# Patient Record
Sex: Male | Born: 1935 | Race: White | Hispanic: No | Marital: Married | Smoking: Former smoker
Health system: Southern US, Community
[De-identification: ages and names within clinical notes are randomized; demographics above are authoritative.]

## PROBLEM LIST (undated history)

## (undated) DIAGNOSIS — J45909 Unspecified asthma, uncomplicated: Secondary | ICD-10-CM

## (undated) DIAGNOSIS — J189 Pneumonia, unspecified organism: Secondary | ICD-10-CM

## (undated) DIAGNOSIS — D649 Anemia, unspecified: Secondary | ICD-10-CM

## (undated) DIAGNOSIS — I350 Nonrheumatic aortic (valve) stenosis: Secondary | ICD-10-CM

## (undated) DIAGNOSIS — I219 Acute myocardial infarction, unspecified: Secondary | ICD-10-CM

## (undated) DIAGNOSIS — M1990 Unspecified osteoarthritis, unspecified site: Secondary | ICD-10-CM

## (undated) DIAGNOSIS — N400 Enlarged prostate without lower urinary tract symptoms: Secondary | ICD-10-CM

## (undated) DIAGNOSIS — I10 Essential (primary) hypertension: Secondary | ICD-10-CM

## (undated) DIAGNOSIS — Z952 Presence of prosthetic heart valve: Secondary | ICD-10-CM

## (undated) DIAGNOSIS — I739 Peripheral vascular disease, unspecified: Secondary | ICD-10-CM

## (undated) DIAGNOSIS — I2510 Atherosclerotic heart disease of native coronary artery without angina pectoris: Secondary | ICD-10-CM

## (undated) DIAGNOSIS — I209 Angina pectoris, unspecified: Secondary | ICD-10-CM

## (undated) DIAGNOSIS — I82509 Chronic embolism and thrombosis of unspecified deep veins of unspecified lower extremity: Secondary | ICD-10-CM

## (undated) DIAGNOSIS — J449 Chronic obstructive pulmonary disease, unspecified: Secondary | ICD-10-CM

## (undated) DIAGNOSIS — E119 Type 2 diabetes mellitus without complications: Secondary | ICD-10-CM

## (undated) DIAGNOSIS — R161 Splenomegaly, not elsewhere classified: Secondary | ICD-10-CM

## (undated) DIAGNOSIS — R911 Solitary pulmonary nodule: Secondary | ICD-10-CM

## (undated) DIAGNOSIS — R011 Cardiac murmur, unspecified: Secondary | ICD-10-CM

## (undated) DIAGNOSIS — D696 Thrombocytopenia, unspecified: Secondary | ICD-10-CM

## (undated) DIAGNOSIS — I6529 Occlusion and stenosis of unspecified carotid artery: Secondary | ICD-10-CM

## (undated) DIAGNOSIS — C8313 Mantle cell lymphoma, intra-abdominal lymph nodes: Secondary | ICD-10-CM

## (undated) DIAGNOSIS — E785 Hyperlipidemia, unspecified: Secondary | ICD-10-CM

## (undated) DIAGNOSIS — M869 Osteomyelitis, unspecified: Secondary | ICD-10-CM

## (undated) HISTORY — DX: Occlusion and stenosis of unspecified carotid artery: I65.29

## (undated) HISTORY — PX: BACK SURGERY: SHX140

## (undated) HISTORY — DX: Essential (primary) hypertension: I10

## (undated) HISTORY — DX: Type 2 diabetes mellitus without complications: E11.9

## (undated) HISTORY — DX: Unspecified osteoarthritis, unspecified site: M19.90

## (undated) HISTORY — DX: Hyperlipidemia, unspecified: E78.5

## (undated) HISTORY — PX: SHOULDER ARTHROSCOPY W/ ROTATOR CUFF REPAIR: SHX2400

## (undated) HISTORY — PX: INCISIONAL HERNIA REPAIR: SHX193

## (undated) HISTORY — PX: HERNIA REPAIR: SHX51

## (undated) HISTORY — DX: Splenomegaly, not elsewhere classified: R16.1

## (undated) HISTORY — PX: CARPAL TUNNEL RELEASE: SHX101

## (undated) HISTORY — PX: CORONARY ANGIOPLASTY WITH STENT PLACEMENT: SHX49

## (undated) HISTORY — PX: KIDNEY SURGERY: SHX687

## (undated) HISTORY — PX: CATARACT EXTRACTION W/ INTRAOCULAR LENS  IMPLANT, BILATERAL: SHX1307

## (undated) HISTORY — DX: Mantle cell lymphoma, intra-abdominal lymph nodes: C83.13

---

## 1993-12-06 HISTORY — PX: CORONARY ARTERY BYPASS GRAFT: SHX141

## 2001-09-18 ENCOUNTER — Encounter (INDEPENDENT_AMBULATORY_CARE_PROVIDER_SITE_OTHER): Payer: Self-pay | Admitting: Specialist

## 2001-09-18 ENCOUNTER — Other Ambulatory Visit: Admission: RE | Admit: 2001-09-18 | Discharge: 2001-09-18 | Payer: Self-pay | Admitting: Gastroenterology

## 2003-08-30 ENCOUNTER — Ambulatory Visit (HOSPITAL_COMMUNITY): Admission: RE | Admit: 2003-08-30 | Discharge: 2003-08-30 | Payer: Self-pay

## 2004-03-16 ENCOUNTER — Inpatient Hospital Stay (HOSPITAL_COMMUNITY): Admission: EM | Admit: 2004-03-16 | Discharge: 2004-03-19 | Payer: Self-pay | Admitting: Emergency Medicine

## 2004-06-19 ENCOUNTER — Encounter: Admission: RE | Admit: 2004-06-19 | Discharge: 2004-06-19 | Payer: Self-pay | Admitting: Orthopedic Surgery

## 2004-06-23 ENCOUNTER — Ambulatory Visit (HOSPITAL_COMMUNITY): Admission: RE | Admit: 2004-06-23 | Discharge: 2004-06-23 | Payer: Self-pay | Admitting: Orthopedic Surgery

## 2004-06-23 ENCOUNTER — Ambulatory Visit (HOSPITAL_BASED_OUTPATIENT_CLINIC_OR_DEPARTMENT_OTHER): Admission: RE | Admit: 2004-06-23 | Discharge: 2004-06-23 | Payer: Self-pay | Admitting: Orthopedic Surgery

## 2004-09-01 ENCOUNTER — Ambulatory Visit (HOSPITAL_COMMUNITY): Admission: RE | Admit: 2004-09-01 | Discharge: 2004-09-01 | Payer: Self-pay | Admitting: Orthopedic Surgery

## 2004-09-01 ENCOUNTER — Ambulatory Visit (HOSPITAL_BASED_OUTPATIENT_CLINIC_OR_DEPARTMENT_OTHER): Admission: RE | Admit: 2004-09-01 | Discharge: 2004-09-01 | Payer: Self-pay | Admitting: Orthopedic Surgery

## 2004-10-20 ENCOUNTER — Ambulatory Visit (HOSPITAL_BASED_OUTPATIENT_CLINIC_OR_DEPARTMENT_OTHER): Admission: RE | Admit: 2004-10-20 | Discharge: 2004-10-20 | Payer: Self-pay | Admitting: Orthopedic Surgery

## 2004-10-20 ENCOUNTER — Ambulatory Visit (HOSPITAL_COMMUNITY): Admission: RE | Admit: 2004-10-20 | Discharge: 2004-10-20 | Payer: Self-pay | Admitting: Orthopedic Surgery

## 2004-12-09 ENCOUNTER — Ambulatory Visit: Payer: Self-pay | Admitting: Cardiology

## 2004-12-14 ENCOUNTER — Ambulatory Visit: Payer: Self-pay | Admitting: Cardiology

## 2005-03-01 ENCOUNTER — Ambulatory Visit: Payer: Self-pay

## 2005-06-25 ENCOUNTER — Ambulatory Visit: Payer: Self-pay | Admitting: Cardiology

## 2005-12-10 ENCOUNTER — Ambulatory Visit: Payer: Self-pay | Admitting: Cardiology

## 2005-12-15 ENCOUNTER — Ambulatory Visit: Payer: Self-pay | Admitting: Cardiology

## 2006-05-27 ENCOUNTER — Ambulatory Visit: Payer: Self-pay

## 2006-12-28 ENCOUNTER — Ambulatory Visit: Payer: Self-pay | Admitting: Cardiology

## 2007-03-10 ENCOUNTER — Ambulatory Visit: Payer: Self-pay

## 2008-02-22 ENCOUNTER — Ambulatory Visit: Payer: Self-pay | Admitting: Cardiology

## 2008-02-22 LAB — CONVERTED CEMR LAB
ALT: 24 units/L (ref 0–53)
AST: 21 units/L (ref 0–37)
Alkaline Phosphatase: 51 units/L (ref 39–117)
Bilirubin, Direct: 0.1 mg/dL (ref 0.0–0.3)
Cholesterol: 133 mg/dL (ref 0–200)
GFR calc Af Amer: 93 mL/min
HDL: 31.6 mg/dL — ABNORMAL LOW (ref >39.0)
Sodium: 140 meq/L (ref 135–145)
Total Bilirubin: 0.8 mg/dL (ref 0.3–1.2)
Total CHOL/HDL Ratio: 4.2
Total Protein: 6.4 g/dL (ref 6.0–8.3)
VLDL: 43 mg/dL — ABNORMAL HIGH (ref 0–40)

## 2008-02-28 ENCOUNTER — Ambulatory Visit: Payer: Self-pay | Admitting: Cardiology

## 2008-03-08 ENCOUNTER — Ambulatory Visit: Payer: Self-pay

## 2009-01-13 ENCOUNTER — Ambulatory Visit: Payer: Self-pay | Admitting: Cardiology

## 2009-02-12 ENCOUNTER — Ambulatory Visit: Payer: Self-pay

## 2009-11-19 ENCOUNTER — Encounter (INDEPENDENT_AMBULATORY_CARE_PROVIDER_SITE_OTHER): Payer: Self-pay | Admitting: *Deleted

## 2009-12-19 DIAGNOSIS — I10 Essential (primary) hypertension: Secondary | ICD-10-CM

## 2009-12-19 DIAGNOSIS — I679 Cerebrovascular disease, unspecified: Secondary | ICD-10-CM

## 2010-01-09 ENCOUNTER — Ambulatory Visit: Payer: Self-pay | Admitting: Cardiology

## 2010-01-15 ENCOUNTER — Telehealth (INDEPENDENT_AMBULATORY_CARE_PROVIDER_SITE_OTHER): Payer: Self-pay | Admitting: *Deleted

## 2010-01-19 ENCOUNTER — Encounter (HOSPITAL_COMMUNITY): Admission: RE | Admit: 2010-01-19 | Discharge: 2010-03-11 | Payer: Self-pay | Admitting: Internal Medicine

## 2010-01-19 ENCOUNTER — Ambulatory Visit: Payer: Self-pay

## 2010-01-19 ENCOUNTER — Ambulatory Visit: Payer: Self-pay | Admitting: Internal Medicine

## 2010-09-26 ENCOUNTER — Emergency Department (HOSPITAL_COMMUNITY): Admission: EM | Admit: 2010-09-26 | Discharge: 2010-09-26 | Payer: Self-pay | Admitting: Emergency Medicine

## 2011-01-07 NOTE — Progress Notes (Signed)
Summary: Nuclear Pre-Procedure  Phone Note Outgoing Call Call back at [facility] Phone [phone]   Call placed by: Stanton Kidney, EMT-P,  January 15, 2010 2:32 PM Action Taken: Phone Call Completed Summary of Call: Left message with information on Myoview Information Sheet (see scanned document for details).     Nuclear Med Background Indications for Stress Test: Evaluation for Ischemia, Graft Patency   History: Angioplasty, CABG, COPD, Myocardial Perfusion Study  History Comments: '95 CABG x5 4/09 MPS: EF=63%, (-) ischemia     Nuclear Pre-Procedure Cardiac Risk Factors: Carotid Disease, Family History - CAD, History of Smoking, Hypertension, Lipids, NIDDM Height (in): 73

## 2011-01-07 NOTE — Assessment & Plan Note (Signed)
Summary: per check out/sf  Medications Added METOPROLOL SUCCINATE 25 MG XR24H-TAB (METOPROLOL SUCCINATE) 1/2 tab once daily        Visit Type:  1 yr f/u Primary Provider:  Ivery Quale  CC:  no cardiac complaints today.  History of Present Illness: [name] returns today for evaluation management of his coronary artery disease, nonobstructive carotid disease the.  He is having no symptoms of angina or ischemia. His last stress Myoview was in 2009 which was stable. He has good LV function.  He's had no symptoms of TIAs or mini strokes. His last carotid Dopplers were in March of 2010. He had nonobstructive disease.  His wife checks his blood pressure at home. He says it always runs a remarkably good. His blood sugars have also been also running good and is followed by his primary care.  Current Medications (verified): 1)  Aspirin Ec 325 Mg Tbec (Aspirin) .... Take One Tablet By Mouth Daily 2)  Diovan 80 Mg Tabs (Valsartan) .Marland Kitchen.. 1 Tab Once Daily 3)  Metoprolol Succinate 25 Mg Xr24h-Tab (Metoprolol Succinate) .... 1/2 Tab Once Daily 4)  Pravastatin Sodium 40 Mg Tabs (Pravastatin Sodium) .Marland Kitchen.. 1 Tab Once Daily 5)  Avapro 150 Mg Tabs (Irbesartan) .... To Replace Diovan 6)  Metformin Hcl 500 Mg Tabs (Metformin Hcl) .Marland Kitchen.. 1 Tab Two Times A Day 7)  Nitrostat 0.4 Mg Subl (Nitroglycerin) .Marland Kitchen.. 1 Tablet Under Tongue At Onset of Chest Pain; You May Repeat Every 5 Minutes For Up To 3 Doses.  Allergies: 1)  ! Macrodantin 2)  ! * Minocin 3)  ! Zithromax  Past History:  Past Medical History: Last updated: 12/19/2009 CAD, ARTERY BYPASS GRAFT/ 1995 (ICD-414.04) CEREBROVASCULAR DISEASE (ICD-437.9) HYPERTENSION (ICD-401.9) HYPERLIPIDEMIA (ICD-272.4) DIABETES MELLITUS, TYPE II (ICD-250.00)    Past Surgical History: Last updated: 12/19/2009 Rotator Cuff Repair carpal tunnel Repair of incisional hernia. CABG..1995 Kidney surgery Back Surgery  Family History: Last updated:  12/19/2009 Family History of Coronary Artery Disease:  Siblings: Brother use of alcohol  Social History: Last updated: 12/19/2009 Retired from [employer] Married  Tobacco Use - Former.  Alcohol Use - no Drug Use - no  Risk Factors: Smoking Status: quit (12/19/2009)  Review of Systems       negative other than history of present illness  Vital Signs:  Patient profile:   77 year old male Height:      73 inches Weight:      217 pounds BMI:     28.73 Pulse rate:   62 / minute Pulse rhythm:   irregular BP sitting:   158 / 80  (left arm) Cuff size:   large  Vitals Entered By: Danielle Rankin, CMA (January 09, 2010 12:03 PM)  Physical Exam  General:  Well developed, well nourished, in no acute distress. Head:  normocephalic and atraumatic Eyes:  PERRLA/EOM intact; conjunctiva and lids normal. Neck:  Neck supple, no JVD. No masses, thyromegaly or abnormal cervical nodes. Chest Arieal Cuoco:  well-healed median sternotomy Lungs:  Clear bilaterally to auscultation and percussion. Heart:  PMI poorly appreciated, soft systolic murmur with a split S2 no gallop Abdomen:  Bowel sounds positive; abdomen soft and non-tender without masses, organomegaly, or hernias noted. No hepatosplenomegaly. Msk:  decreased ROM.  decreased ROM.   Pulses:  pulses normal in all 4 extremities Extremities:  No clubbing or cyanosis. Neurologic:  Alert and oriented x 3. Skin:  Intact without lesions or rashes. Psych:  Normal affect.   EKG  Procedure date:  01/09/2010  Findings:      normal sinus rhythm with PACs, no acute changes  Impression & Recommendations:  Problem # 1:  CAD, ARTERY BYPASS GRAFT/ 1995 (ICD-414.04)  His updated medication list for this problem includes:    Aspirin Ec 325 Mg Tbec (Aspirin) .Marland Kitchen... Take one tablet by mouth daily    Metoprolol Succinate 25 Mg Xr24h-tab (Metoprolol succinate) .Marland Kitchen... 1/2 tab once daily    Nitrostat 0.4 Mg Subl (Nitroglycerin) .Marland Kitchen... 1 tablet under tongue at  onset of chest pain; you may repeat every 5 minutes for up to 3 doses.  His updated medication list for this problem includes:    Aspirin Ec 325 Mg Tbec (Aspirin) .Marland Kitchen... Take one tablet by mouth daily    Metoprolol Succinate 25 Mg Xr24h-tab (Metoprolol succinate) .Marland Kitchen... 1/2 tab once daily    Nitrostat 0.4 Mg Subl (Nitroglycerin) .Marland Kitchen... 1 tablet under tongue at onset of chest pain; you may repeat every 5 minutes for up to 3 doses.  Orders: EKG w/ Interpretation (93000) Nuclear Stress Test (Nuc Stress Test)  Problem # 2:  CEREBROVASCULAR DISEASE (ICD-437.9) Assessment: Unchanged  Patient Instructions: 1)  Your physician recommends that you schedule a follow-up appointment in: YEAR WITH DR Giulio Bertino 2)  Your physician recommends that you continue on your current medications as directed. Please refer to the Current Medication list given to you today. 3)  Your physician has requested that you have an adenosine myoview.  For further information please visit [email or web address]  Please follow instruction sheet, as given. Prescriptions: METOPROLOL SUCCINATE 25 MG XR24H-TAB (METOPROLOL SUCCINATE) 1/2 tab once daily  #90 x 3   Entered by:   Danielle Rankin, CMA   Authorized by:   Gaylord Shih, MD, [facility]   Signed by:   Danielle Rankin, CMA on 01/09/2010   Method used:   Electronically to        [facility]* (mail-order)       [address] [place], [place]  [address]       Ph: [phone]       Fax: [phone]   RxID:   [id number]

## 2011-01-07 NOTE — Assessment & Plan Note (Signed)
Summary: Cardiology Nuclear Study  Nuclear Med Background Indications for Stress Test: Evaluation for Ischemia, Graft Patency   History: Angioplasty, CABG, COPD, Heart Catheterization, Myocardial Perfusion Study  History Comments: Multiple PTCA's; '95 CABG x 5; 4/09 MPS:no ischemia, EF=63%  Symptoms: DOE, Fatigue    Nuclear Pre-Procedure Cardiac Risk Factors: Carotid Disease, Family History - CAD, History of Smoking, Hypertension, Lipids, NIDDM Caffeine/Decaff Intake: None NPO After: 6:00 PM Lungs: Clear IV 0.9% NS with Angio Cath: 22g     IV Site: (R) Wrist IV Started by: Irean Hong RN Chest Size (in) 46     Height (in): 73 Weight (lb): 212 BMI: 28.07  Nuclear Med Study 1 or 2 day study:  1 day     Stress Test Type:  Eugenie Birks Reading MD:  Arvilla Meres, MD     Referring MD:  Valera Castle, MD Resting Radionuclide:  Technetium 78m Tetrofosmin     Resting Radionuclide Dose:  11.0 mCi  Stress Radionuclide:  Technetium 57m Tetrofosmin     Stress Radionuclide Dose:  33.0 mCi   Stress Protocol   Lexiscan: 0.4 mg   Stress Test Technologist:  Rea College CMA-N     Nuclear Technologist:  Burna Mortimer Deal RT-N  Rest Procedure  Myocardial perfusion imaging was performed at rest 45 minutes following the intravenous administration of Myoview Technetium 34m Tetrofosmin.  Stress Procedure  The patient received IV Lexiscan 0.4 mg over 15-seconds.  Myoview injected at 30-seconds.  There were no significant changes with lexiscan, only occasional PVC's and PAC's.  Quantitative spect images were obtained after a 45 minute delay.  QPS Raw Data Images:  Normal; no motion artifact; normal heart/lung ratio. Stress Images:  There is normal uptake in all areas. Rest Images:  Normal homogeneous uptake in all areas of the myocardium. Subtraction (SDS):  Normal Transient Ischemic Dilatation:  1.13  (Normal <1.22)  Lung/Heart Ratio:  .34  (Normal <0.45)  Quantitative Gated Spect Images QGS  EDV:  129 ml QGS ESV:  47 ml QGS EF:  63 % QGS cine images:  Normal  Findings Normal nuclear study      Overall Impression  Exercise Capacity: Lexiscan study with no exercise. ECG Impression: Baseline: NSR; No significant ST segment change with Lexiscan. Overall Impression: Normal stress nuclear study.  Appended Document: Cardiology Nuclear Study excellent. No change in treatment.  Appended Document: Cardiology Nuclear Study PT AWARE./CY    Appended Document: Cardiology Nuclear Study CORRECTION PT'S WIFE AWARE./CY

## 2011-01-27 ENCOUNTER — Telehealth (INDEPENDENT_AMBULATORY_CARE_PROVIDER_SITE_OTHER): Payer: Self-pay | Admitting: *Deleted

## 2011-02-02 NOTE — Progress Notes (Signed)
   Phone Note Outgoing Call   Call placed by: Lisabeth Devoid RN,  January 27, 2011 4:01 PM Call placed to: Patient Summary of Call: yearly appt  Follow-up for Phone Call        Left message with pt wife about making yearly appt with Dr. Daleen Squibb.  Pt will call back to make appt. Follow-up by: Lisabeth Devoid RN,  January 27, 2011 4:01 PM

## 2011-02-17 LAB — COMPREHENSIVE METABOLIC PANEL
AST: 18 U/L (ref 0–37)
Albumin: 3.9 g/dL (ref 3.5–5.2)
BUN: 18 mg/dL (ref 6–23)
Calcium: 9.3 mg/dL (ref 8.4–10.5)
Chloride: 108 mEq/L (ref 96–112)
Creatinine, Ser: 1.03 mg/dL (ref 0.4–1.5)
GFR calc Af Amer: >60 mL/min (ref >60)
Total Bilirubin: 0.8 mg/dL (ref 0.3–1.2)

## 2011-02-17 LAB — DIFFERENTIAL
Basophils Absolute: 0 10*3/uL (ref 0.0–0.1)
Lymphocytes Relative: 8 % — ABNORMAL LOW (ref 12–46)
Lymphs Abs: 0.7 10*3/uL (ref 0.7–4.0)
Monocytes Absolute: 0.5 10*3/uL (ref 0.1–1.0)
Neutro Abs: 7.7 10*3/uL (ref 1.7–7.7)

## 2011-02-17 LAB — POCT CARDIAC MARKERS
Myoglobin, poc: 98.6 ng/mL (ref 12–200)
Troponin i, poc: <0.05 ng/mL (ref 0.00–0.09)

## 2011-02-17 LAB — CBC
MCH: 33.2 pg (ref 26.0–34.0)
MCHC: 33.6 g/dL (ref 30.0–36.0)
MCV: 98.7 fL (ref 78.0–100.0)
Platelets: 135 10*3/uL — ABNORMAL LOW (ref 150–400)
RBC: 3.86 MIL/uL — ABNORMAL LOW (ref 4.22–5.81)
RDW: 13.3 % (ref 11.5–15.5)

## 2011-02-17 LAB — PROTIME-INR: Prothrombin Time: 14.1 seconds (ref 11.6–15.2)

## 2011-02-17 LAB — APTT: aPTT: 29 seconds (ref 24–37)

## 2011-04-18 ENCOUNTER — Emergency Department (HOSPITAL_COMMUNITY)
Admission: EM | Admit: 2011-04-18 | Discharge: 2011-04-18 | Disposition: A | Discharge status: Home / Self Care | Payer: Medicare Other | Attending: Emergency Medicine | Admitting: Emergency Medicine

## 2011-04-18 ENCOUNTER — Emergency Department (HOSPITAL_COMMUNITY): Payer: Medicare Other

## 2011-04-18 DIAGNOSIS — I2510 Atherosclerotic heart disease of native coronary artery without angina pectoris: Secondary | ICD-10-CM | POA: Insufficient documentation

## 2011-04-18 DIAGNOSIS — Z79899 Other long term (current) drug therapy: Secondary | ICD-10-CM | POA: Insufficient documentation

## 2011-04-18 DIAGNOSIS — R220 Localized swelling, mass and lump, head: Secondary | ICD-10-CM | POA: Insufficient documentation

## 2011-04-18 DIAGNOSIS — R51 Headache: Secondary | ICD-10-CM | POA: Insufficient documentation

## 2011-04-18 DIAGNOSIS — IMO0002 Reserved for concepts with insufficient information to code with codable children: Secondary | ICD-10-CM | POA: Insufficient documentation

## 2011-04-18 DIAGNOSIS — B029 Zoster without complications: Secondary | ICD-10-CM | POA: Insufficient documentation

## 2011-04-18 DIAGNOSIS — R110 Nausea: Secondary | ICD-10-CM | POA: Insufficient documentation

## 2011-04-18 DIAGNOSIS — Z7982 Long term (current) use of aspirin: Secondary | ICD-10-CM | POA: Insufficient documentation

## 2011-04-18 DIAGNOSIS — E119 Type 2 diabetes mellitus without complications: Secondary | ICD-10-CM | POA: Insufficient documentation

## 2011-04-18 LAB — GLUCOSE, CAPILLARY: Glucose-Capillary: 159 mg/dL — ABNORMAL HIGH (ref 70–99)

## 2011-04-20 NOTE — Assessment & Plan Note (Signed)
[facility]                            CARDIOLOGY OFFICE NOTE   [name]                      MRN:          [id number]  DATE:01/13/2009                            DOB:          1927-09-10    [name] comes in today for a followup.   PROBLEM LIST:  1. Coronary artery disease.  He is status post coronary artery bypass      grafting in 1995 with 5 grafts.  His last stress Myoview was March 08, 2008, which showed no ischemia.  Ejection fraction of 63%.  He      is having no angina.  2. Hyperlipidemia, followed by Dr. Eloise [name].  He brings a copy of his      lipids today which looked remarkably good.  3. Hypertension.  Follow up with Dr. Eloise [name].  He has been recently      changed from Diovan to Avapro.  4. Type 2 diabetes.  His last hemoglobin A1c was 7%.  5. Cerebrovascular disease.  Last carotid Doppler showed      nonobstructive plaque in April 2009.  He has antegrade flow in both      vertebrals.   He offers no complaints today.   MEDICATIONS:  1. Aspirin 325 mg a day.  2. Diovan 80 mg a day.  3. Toprol-XL 12.5 mg per day.  4. Pravastatin 40 mg a day.  5. Avapro 150 mg a day, which he has not started yet, but will      substitute for Diovan.  6. Metformin 500 b.i.d.  7. Sublingual nitroglycerin.   PHYSICAL EXAMINATION:  GENERAL:  He is elderly-appearing.  He is in no  acute distress.  SKIN:  Warm and dry.  VITAL SIGNS:  His weight is 215, down 4, blood pressure is 142/62, pulse  58 and regular.  HEENT:  Normal.  NECK:  Carotid upstrokes were equal bilateral without bruits.  No JVD.  Thyroid is not enlarged.  Trachea is midline.  LUNGS:  Clear to auscultation and percussion.  HEART:  A nondisplaced PMI.  Soft S1 and S2.  ABDOMEN:  Soft.  Good bowel sounds.  No midline bruit.  No pulsatile  mass.  EXTREMITIES:  No cyanosis, clubbing, or edema.  Pulses are intact, but  reduced  NEUROLOGIC:  Intact.  SKIN:  Few  ecchymoses and some moles.  No acute abnormalities.   Electrocardiogram shows sinus brady with a slight left axis shift.  No  other changes.  He has an occasional PAC.   ASSESSMENT:  [name] is doing well.  We will schedule carotid  Dopplers.  I have made no change in his medical program.  I will plan on  seeing him back again in a year.     Richard C. Daleen Squibb, MD, [facility]  Electronically Signed    TCW/MedQ  DD: 01/13/2009  DT: 01/14/2009  Job #: [id number]   cc:   [name]. [name], M.D.

## 2011-04-20 NOTE — Assessment & Plan Note (Signed)
[name]                            CARDIOLOGY OFFICE NOTE   Richard, [name]                      MRN:          [id number]  DATE:02/28/2008                            DOB:          1927-07-22    PROBLEM LIST:  [name] comes in today for careful follow up of the  following issues:  1. Coronary artery disease.  Status post coronary bypass grafting in      1995 with 5 grafts.  His last stress Myoview was May 27, 2006, EF      62% with normal wall motion and no ischemia.  He continues to do      well and seems to be fairly asymptomatic.  He does have some      dyspnea on exertion.  2. Mixed hyperlipidemia.  He is being followed by Dr. Dossie [name] at      [facility].  He has had a low HDL for years.  Recent blood      work that he asked [place] to check here showed a total cholesterol 133,      triglycerides of 216, HDL 31.6, VLDL 43, LDL of 66.  His fasting      blood sugar was also 155.  He says Dr. Eloise [name] has been working      with him on this and has added metformin.  He says that I eat      whatever I want to eat.  In fact, he has a good relationship with      my brother [name] in [name], and eats over      there frequently!  3. Hypertension.  4. Hyperglycemia now being treated with metformin.  5. Nonobstructive carotid disease, last carotid Dopplers April 2008.   CURRENT MEDICATIONS:  1. Aspirin 325 mg a day.  2. Diovan 80 mg a day.  3. Toprol XL 12.5 mg a day.  4. Pravastatin 40 mg a day (Lipitor caused significant muscle aches).  5. Metformin 500 mg a day.  6. He carries sublingual nitroglycerin.   PHYSICAL EXAMINATION:  VITAL SIGNS:  His blood pressure is up today.  He  is 162/62, his pulse 57 and regular.  Weight is 218.8 up 1.5 pounds.  HEENT:  He has a ruddy complexion which is baseline.  He seems to have a  little bit of a tremor to his head and his hands.  This seems to be new.  PERRLA.   Extraocular movements are intact.  He has cataracts that are  visible just by looking at him.  Sclerae are clear.  Facial symmetry is  normal.  Dentition is satisfactory.  NECK:  Supple.  Carotid upstrokes were equal bilaterally with soft  carotid bruits.  He has no JVD.  Thyroid is not enlarged.  Trachea is  midline.  LUNGS:  Remarkable for good breath sounds.  HEART:  Reveals a nondisplaced PMI.  Normal S1-S2 without gallop.  ABDOMEN:  Protuberant with good bowel sounds.  No midline bruit.  No  obvious pulsatile mass.  EXTREMITIES:  No cyanosis, clubbing or edema.  He has some varicose  veins.  Pulses were reduced 1+/4+ dorsalis pedis particularly on the  left, 1+ posterior tibials bilaterally.  He has decreased capillary  refill.  NEURO:  Intact.  SKIN:  Shows a few ecchymoses particularly of his upper extremities.   DIAGNOSTICS:  His electrocardiogram shows sinus brady, otherwise normal.   ASSESSMENT/PLAN:  I had a long talk with [name] today.  I have told  him to watch the carbohydrates particularly empty calories such as  sweets and soft drinks.  I have asked him to continue his current  medications.  We will arrange for him to have carotid Dopplers and  adenosine rest/stress Myoview.  If this is stable, we will see him back  in a year.     Richard C. Daleen Squibb, MD, [facility]  Electronically Signed    TCW/MedQ  DD: 02/28/2008  DT: 02/29/2008  Job #: [id number]   cc:   Richard Payor, MD

## 2011-04-23 NOTE — Op Note (Signed)
[name], [name]               ACCOUNT NO.:  [id number]   MEDICAL RECORD NO.:  [id number]          PATIENT TYPE:  AMB   LOCATION:  DSC                          FACILITY:  [facility]   PHYSICIAN:  Katy Fitch. Sypher Montez Hageman., M.D.DATE OF BIRTH:  May 24, 1927   DATE OF PROCEDURE:  10/20/2004  DATE OF DISCHARGE:                                 OPERATIVE REPORT   PREOPERATIVE DIAGNOSES:  1.  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  2.  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   POSTOPERATIVE DIAGNOSES:  1.  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  2.  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   OPERATIONS:  1.  Diagnostic arthroscopy of left glenohumeral joint with identification of      minor deep surface degenerative tearing of supraspinatus and      infraspinatus rotator cuff tendons.  2.  Significant degenerative labral tear and minor chondromalacia of      posterior aspect of glenoid with subsequent debridement of posterior      labrum from 11 o'clock to approximately 8 o'clock posteriorly.  3.  Subacromial decompression with bursectomy, coracoacromial ligament      resection and acromioplasty.  4.  Through a separation incision at the hand, left carpal tunnel release.   OPERATING SURGEON:  Katy Fitch. Sypher, M.D.   ASSISTANT:  Jonni Sanger, P.A.   ANESTHESIA:  General endotracheal supplemented by interscalene block.   SUPERVISING ANESTHESIOLOGIST:  Maren Beach, M.D.   INDICATIONS:  [name] is a  77 year old gentleman referred by Dr. Merlyn Lot  for evaluation and management of a painful left shoulder.  Clinical  examination revealed that he had a history of AC arthropathy and is status  post decompression of the right shoulder and open rotator cuff repair one  year prior.   He was subsequently diagnosed to have bilateral carpal tunnel syndrome and  has had a carpal tunnel release.  He now presents for combined left carpal  tunnel release and appropriate management of his left shoulder pathology.   Preoperative MRI documented profound AC arthropathy, signs of chronic  subacromial impingement with tendonopathy of the rotator cuff and a  calcified coracoacromial ligament.  He had a very prominent distal clavicle  that was the primary cause of this impingement.   Careful inspection of the preoperative MRI also suggested a significant  degree of labral pathology.   We recommended arthroscopic evaluation followed by appropriate intervention.   PROCEDURES:  [name] is brought to the operating room and placed in  the supine position on the operating table.  Dr. Katrinka Blazing placed an  interscalene block in the holding area.   Following the induction of general endotracheal anesthesia, he was carefully  positioned in the beach chair position with the aid of torso and head  holders for shoulder arthroscopy.  The entire left upper extremity and  forequarter were prepped with Duraprep and draped with impervious  arthroscopy drapes.   The initial procedure was the left carpal tunnel release.  The hand was  exposed by release of the stockinette followed by use of an Esmarch bandage  as a forearm tourniquet.  A short incision was fashioned in the line of the  ring finger and the palm.  Subcutaneous tissues were carefully divided,  revealing the palmar fascia.  This was split in line with its fibers to  reveal the common sensory branch of the medial nerve and the superficial  palmar arch.  The  distal margin of the transverse carpal ligament was  isolated and a Runner, broadcasting/film/video was used to clear the synovium off of the  deep surface of the transverse carpal ligament.   The ligament was then released along its ulnar border with scissors,  extending into the distal forearm.  This widely opened the carpal canal.  There was noted to be profound hypertrophy of the ulnar bursa, but otherwise  no masses were noted.   The wound was subsequently repaired with intradermal 3-0 Prolene.   A Tegaderm dressing was applied directly to the wound followed by sterile  gauze.  The stockinette was then reconstituted with an OpSite dressing.   Attention was then directed to the shoulder.  The anatomy of the distal  clavicle, acromion and deltoid was carefully identified with a marking pen  followed by distention of the shoulder with 20 mL of sterile saline  utilizing a 20 gauge spinal needle posteriorly.  The scope was introduced  without difficulty into the glenohumeral joint with a standard posterior  approach followed by diagnostic arthroscopy of the glenohumeral joint.   The high intra-articular surfaces of the glenoid and humeral head were  basically normal, except for a rim of grade 3 chondromalacia adjacent to the  posterior labrum.  The anterior capsule was notable for a moderate degree of  adhesive capsulitis.  A suction shaver was used anteriorly to take down the  adhesions from the adhesive capsulitis followed by use of the bipolar  cautery to obtain hemostasis.   The scope was switched to the anterior portal with switching stick followed  by careful inspection of the posterior pathology.  The labrum was rather  degenerative.  A 4.5 mm suction shaver was used to debride the posterior  labrum to a smooth rim followed by minor synovectomy.  Hemostasis was  achieved with the bipolar cautery.  The condition of the rotator cuff was then documented photographically,  revealing minor  degenerative deep surface tearing at the greater tuberosity.  There was no sign of retracted tear.  The biceps tendon was normal at its  anchor to the superior labrum and normal through the rotator interval.  The  anterior glenohumeral ligaments were normal, the subscapularis was normal  and the inferior capsule was unremarkable.   The scope was removed from the glenohumeral joint and placed in the  subacromial space.  A florid bursitis was noted.  A thorough bursectomy was  accomplished with the suction shaver followed by use of the bipolar cautery  to obtain hemostasis.  The coracoacromial ligament was released with the  cutting cautery and calcification  of the ligament was noted.  A significant  portion of the ligament was resected with the suction shaver.   The acromion was then leveled to a type 1 morphology with a contoured  anterior lip to prevent impingement.  The [facility] joint capsule was taken down  with the cautery and photographic documentation of the [facility] pathology was  obtained.   The distal clavicle ligaments were released with the cautery followed by  conversion to an open distal clavicle resection.  A 2 cm incision was  fashioned directly over this clavicle followed by subperiosteal exposure of  the distal 15 mm of the clavicle.   After baby Bennett retractors were placed, an oscillating saw was used to  remove the distal clavicle, which had a 1 cm long inferior osteophyte that  was causing the primary impingement.   The coracoacromial ligament and other capsule ligamentous structures were  palpated and found to be calcified.  A rongeur was used to remove more of  the coracoacromial ligament.   The wound was then closed with closure of the anterior deltoid and anterior  trapezius muscles with a mattress suture of #2 fiber wire followed by repair  of the skin with subdermal sutures of 2-0 Vicryl and intradermal 3-0  Prolene.  The scope was replaced in the shoulder and clot  and debris removed  from the subacromial space.  Final hemostasis achieved followed by removal  of the arthroscopic equipment.   The portals were repaired with intradermal 3-0 Prolene mattress sutures.   The wounds were then dressed with OpSite dressing followed by a voluminous  gauze dressing and Hypafix.   [name] tolerated the surgery and anesthesia well.  He was transferred  to recovery room with stable vital signs.  For aftercare, he will be  admitted to recovery care for observation of his vital signs and antibiotics  in the form of Ancef 1 g IV q.8h. x 3 doses and appropriate IV and p.o.  analgesics.      Robe   RVS/MEDQ  D:  10/20/2004  T:  10/20/2004  Job:  [id number]   cc:   Cindee Salt, M.D.  [address]  [place]  [place] [address]  Fax: [phone]

## 2011-04-23 NOTE — H&P (Signed)
NAMECRANDALL, HARVEL NO.:  [id number]   MEDICAL RECORD NO.:  [id number]                   PATIENT TYPE:  EMS   LOCATION:  MAJO                                 FACILITY:  [facility]   PHYSICIAN:  Gwen Pounds, M.D.                 DATE OF BIRTH:  1927-02-08   DATE OF ADMISSION:  03/16/2004  DATE OF DISCHARGE:                                HISTORY & PHYSICAL   PRIMARY CARE PHYSICIAN:  Vania Rea. Jarold Motto, M.D.   GASTROENTEROLOGIST:  Vania Rea. Jarold Motto, M.D.   ENT:  Kinnie Scales. Annalee Genta, M.D.   UROLOGIST:  Claudette Laws, M.D.   RHEUMATOLOGIST:  Areatha Keas, M.D.   CARDIOLOGIST:  Jesse Sans. Wall, M.D.   PULMONOLOGIST:  Charlaine Dalton. Sherene Sires, M.D.   SURGEON:  Lebron Conners, M.D.   OPHTHALMOLOGIST:  Lyn Henri, M.D.   CARDIOTHORACIC SURGEON:  Evelene Croon, M.D.   CHIEF COMPLAINT:  Small-bowel obstruction, abdominal pain and bloating.   HISTORY OF PRESENT ILLNESS:  A 77 year old male with coronary artery  disease, hypertension, and hyperlipidemia.  He had a ventral hernia surgery  repair at the end of 2004.  He saw Dr. Jarold Motto January 21, 2004, for a  physical.  Secondary to a hoarse voice, he was sent to Dr. Annalee Genta who  diagnosed him with potential LPR versus GERD and started him on Nexium.  It  did not help much.  This medication was started on March 06, 2004.  He also  had recent prostatitis, question, treated by Dr. Etta Grandchild who placed him on  antibiotics in March of 2005.  He had a reaction to this infection but ended  up getting better.  He was also placed on a new medication, Sanctura on  February 18, 2004.  It helped with his polyuria.  Since starting the Nexium on  March 06, 2004, the patient has been describing increasing GI issues  including abdominal cramping and bloating.  He took one Darvocet last night  because he felt bad and was having increasing shoulder pain.  He had his  last bowel movement at  10 and 11 this morning and had had  little hard  pellets.  He claimed his bowels have changed since starting on the Nexium.  Today after lunch, he developed more epigastric pressure.  It progressed and  worsened.  He called me after hours and I told him to come to the emergency  room.  He described some nausea at home, a significant amount of vomiting in  the emergency room, no fever, positive bloating, positive severe pain,  positive acid bile taste in the mouth and positive significant belching.  In  the ED, he was given an NG tube to low wall suctioning and given some  morphine without help.  He has had no history of any GI surgeries.   PAST MEDICAL HISTORY:  1. Hyperlipidemia.  2. Hypertension.  3. Coronary artery disease, status post CABG in 1995.  4. Osteoarthritis of the right shoulder.  5. Asthmatic bronchitis.  6. Hyperglycemia/diabetes mellitus type 2.  7. BPH.  8. Nephrolithiasis.  9. L-spine laminectomy.  10.      Incisional hernia in the epigastric area.  11.      Leg cramps.   MEDICATIONS:  1. Pravachol 20 daily.  2. Hydrochlorothiazide 12.5 daily.  3. Toprol XL 25 one half daily.  4. Diovan 80 mg daily.  5. Celebrex 200 daily.  6. Ecotrin 325 daily.  7. Nexium 40 daily since March 06, 2004.  8. Sanctura (trospium chloride) 20 mg daily.  9. Darvocet-N 100 p.r.n.  He took his last one last night.  10.      Nitroglycerin 0.4 mg sublingual p.r.n.  11.      Quinine p.r.n.   ALLERGIES:  1. Z-PACK.  2. MACRODANTIN.  3. MINOCIN/DOXYCYCLINE.   SOCIAL HISTORY:  He lives with his wife.  He is retired from [employer].  Remote tobacco.  No alcohol.   FAMILY HISTORY:  Coronary artery disease and longevity.   REVIEW OF SYMPTOMS:  Stress test and evaluation by Dr. Daleen Squibb was negative in  January of 2005.  No fevers, no chills, no night sweats.  He is having  significant for borborygmi, having the GI symptoms as noted above.  No chest  pain.  No shortness of breath.  Had a colonoscopy on September 18, 2004,  which  showed sigmoid polyps.  No other current symptoms at this time.  All other  organ systems reviewed and negative.   PHYSICAL EXAMINATION:  GENERAL APPEARANCE:  Some discomfort secondary to NG  tube.  Was feeling better with 800 mL out at the beginning of this write-up.  VITAL SIGNS:  Afebrile.  Blood pressure 164/88, respiratory rate 16, heart  rate 67.  HEENT:  Dry mouth.  NG tube in place.  EOMI.  PERRL.  PULMONARY:  Clear to auscultation bilaterally.  CARDIOVASCULAR:  Regular midline sternotomy.  ABDOMEN:  Distended, tender, having some bubbling and gurgling in the lower  quadrants.  There is no rebound, no guarding and the NG tube suction noises  were clearly heard.  Epigastric scar where the repaired ventral hernia was  at the epigastric area.  EXTREMITIES:  No clubbing, cyanosis, or edema.   LABORATORY DATA:  EKG showed normal sinus rhythm.   Sodium 137, potassium 4.2, chloride 107, bicarb 24, BUN 27, creatinine 1.3,  glucose 166.  White blood cell count 11.3, hemoglobin 15.4, platelets 235.  CK and troponin I negative.   Acute abdominal series showed dilated small-bowel in stomach, compatible  with small-bowel obstruction.  Chest x-ray shows low volumes, atelectasis  and midline sternotomy.   ASSESSMENT:  This is a 77 year old male being admitted for small-bowel  obstruction, abdominal pain and abdominal discomfort.  He claims not to be  using his Darvocet.  That will be the most likely cause of these symptoms.  Will look into the possibility that Nexium has caused this.   PLAN:  1. Admit.  2. NG tube to suction.  3. Avoid narcotics unless absolutely needed.  4. Toradol for pain.  5. Bowel rest, sips and chips only.  6. Consider surgical evaluation as needed.  7. Reglan for promotility.  8. Avoid Nexium because symptoms correlate with the start of this drug.  It    is probably a coincidence but will look into the possibility and only use  Zantac for now.   Pharmacy to consult and render an opinion.  9. May need enema or suppository for constipation over the next one to two     days.  10.      Repeat acute abdominal series in a.m.  11.      Chloraseptic for sore throat.  12.      Watch blood pressure and use Lopressor IV.  13.      Coronary artery disease is stable.  14.      Rule out pancreatitis and follow up on labs in the morning.  15.      Ativan for anxiety and sleep.  16.      Phenergan p.r.n.                                                Gwen Pounds, M.D.    JMR/MEDQ  D:  03/16/2004  T:  03/17/2004  Job:  [id number]   cc:   Vania Rea. Jarold Motto, M.D. [facility]   Kinnie Scales. Annalee Genta, M.D.  [address] [place]  [place] [address]  Fax: [phone]   Claudette Laws, M.D.  [address]  [place]  [place] [address]  Fax: [phone]   Areatha Keas, M.D.  [address]  [address]  [place]  [place] [address]  Fax: [phone]   Jesse Sans. Wall, M.D.   Charlaine Dalton. Sherene Sires, M.D. [facility]   Lorre Munroe., M.D.  Fax: [phone]   Vernell Leep., M.D.  [address] [place]  [place] [address]  Fax: [phone]   Evelene Croon, M.D.  [address]  [place]  [place] [address]  Fax: [phone]

## 2011-04-23 NOTE — Op Note (Signed)
NAMEGEORG, [name]               ACCOUNT NO.:  [id number]   MEDICAL RECORD NO.:  [id number]          PATIENT TYPE:  AMB   LOCATION:  DSC                          FACILITY:  [facility]   PHYSICIAN:  Katy Fitch. Sypher Montez Hageman., M.D.DATE OF BIRTH:  November 06, 1927   DATE OF PROCEDURE:  09/01/2004  DATE OF DISCHARGE:                                 OPERATIVE REPORT   PREOPERATIVE DIAGNOSIS:  Chronic entrapment neuropathy, right median nerve  at carpal tunnel.   POSTOPERATIVE DIAGNOSIS:  Chronic entrapment neuropathy, right median nerve  at carpal tunnel.   OPERATION:  Release of right transverse carpal ligament.   OPERATING SURGEON:  Katy Fitch. Sypher, M.D.   ASSISTANT:  Jonni Sanger, P.A.   ANESTHESIA:  General by LMA, supervising anesthesiologist is Janetta Hora.  Gelene Mink, M.D.   INDICATIONS:  [name] is a 77 year old right-hand dominant man who is  retired from [employer].  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  He has  developed bilateral hand numbness, right worse than left, consistent with  carpal tunnel syndrome.  Electrodiagnostic studies confirm severe right  carpal tunnel syndrome.   Due to a failure to respond to nonoperative measures, he was brought to the  operating room at this time for release of his right transverse carpal  ligament.   PROCEDURE:  [name] was brought to the operating room and placed in  the supine position on the operating table.  Following induction of general  anesthesia by LMA, the right arm was prepped with Betadine soap and solution  and sterilely draped.  Following exsanguination of the limb with an Esmarch  bandage, the arterial tourniquet over the proximal brachium is inflated to  220 mmHg.  The procedure commenced with a short incision in the line of the  ring finger of the palm.  The subcutaneous tissues were carefully divided to  reveal the palmar fascia.  This was split longitudinally to reveal the  common sensory branch of the  median nerve.   These were followed back to the transverse carpal ligament, which was  carefully isolated from the median enure.  The ligament was released along  its ulnar border, extending into the distal forearm.  This widely opened the  carpal canal.  No masses or other predicaments are noted.   Bleeding points along the margin of the released ligament were  electrocauterized with bipolar current, followed by repair of the skin with  intradermal 3-0 Prolene.   A compressive dressing is applied with a volar plaster splint maintaining  the wrist in 5 degrees of dorsiflexion.   For aftercare, [name] is given a prescription for Percocet 5 mg one  p.o. q.4-6h. p.r.n. pain, 20 tablets without refill.   He will return to our office for follow-up in one week.       RVS/MEDQ  D:  09/01/2004  T:  09/01/2004  Job:  [id number]

## 2011-04-23 NOTE — Assessment & Plan Note (Signed)
[facility]                            CARDIOLOGY OFFICE NOTE   MITCHELL, IWANICKI                      MRN:          [id number]  DATE:12/28/2006                            DOB:          1927/11/07    [name] returns today for further management of the following  issues:  1. Coronary artery disease.  Status post coronary artery bypass      grafting September 1995 x5 grafts.  Stress Myoview May 27, 2006:      EF 62%, normal wall motion and no ischemia.  He is asymptomatic.  2. Mixed hyperlipidemia, being followed by Dr. Eloise Harman.  He has been      at goal except for a low HDL for a number of years.  3. Hypertension.  4. Hyperglycemia.   His medications are:  1. Aspirin 325 a day.  2. Diovan 80 daily.  3. Lipitor 10 daily.  4. Toprol-XL 12.5 daily.  5. Celebrex 200 mg a day.   He looks remarkably good.  His blood pressure is 122/80, pulse 55, and  he is in sinus bradycardia.  EKG is stable.  Weight is 217.  HEENT:  Normocephalic, atraumatic, PERRLA, extraocular movements intact, sclerae  clear.  Facial symmetry is normal, dentition satisfactory.  Carotid  upstrokes were equal bilaterally with no significant bruits.  Thyroid is  not enlarged, trachea is midline.  Lungs are clear to auscultation.  Heart reveals regular rate and rhythm without gallop.  Abdominal exam is  soft with good bowel sounds; there is no hepatomegaly.  Extremities  reveal no cyanosis, clubbing or edema.  Pulses were diminished.  Neurologic exam is intact.   [name] is doing well.  I have made no changes in his program.  We  renewed his Toprol-XL and gave him generic metoprolol extended-release.  I will plan on seeing him back in a year.  At that time he will need a  stress Myoview.     Thomas C. Daleen Squibb, MD, [facility]  Electronically Signed    TCW/MedQ  DD: 12/28/2006  DT: 12/28/2006  Job #: [id number]   cc:   Barry Dienes. Eloise Harman, M.D.

## 2011-04-23 NOTE — Discharge Summary (Signed)
[name], SENN NO.:  [id number]   MEDICAL RECORD NO.:  [id number]                   PATIENT TYPE:  INP   LOCATION:  6707                                 FACILITY:  [facility]   PHYSICIAN:  Barry Dienes. Eloise Harman, M.D.            DATE OF BIRTH:  [birth year]   DATE OF ADMISSION:  03/16/2004  DATE OF DISCHARGE:  03/19/2004                                 DISCHARGE SUMMARY   HISTORY OF PRESENT ILLNESS:  The patient is a 77 year old white male with a  known history of coronary artery disease, hypertension, and hyperlipidemia.  He was recently seen by his ENT physician for evaluation of hoarse throat.  Laryngoscopy was suggestive of GERD-induced laryngitis, so he was started on  Nexium. He was also recently seen by his urologist who felt that he had an  overactive bladder, so he was started on Sanctura. He has noted  progressively worsening constipation since that time. He had had one small  bowel movement on the day prior to admission. Subsequently, he developed  increasing epigastric pain and diffuse abdominal discomfort associated with  nausea and vomiting. He presented to the emergency room for evaluation.  Their evaluation was remarkable for marked distention of the stomach and  small bowel consistent with a small-bowel obstruction. A nasogastric tube  was inserted, and he was admitted to a medical bed for further evaluation.   INITIAL PHYSICAL EXAMINATION:  GENERAL:  In general, he is a well-developed,  well-nourished, white male. His abdominal discomfort was already improved  after nasogastric tube drew off approximately 800 mL of bilious fluid.  VITAL SIGNS:  Temperature was 98, blood pressure 164/88, respirations 16,  pulse 67.  HEENT:  Consistent for dry mouth.  NECK:  Supple and without jugular venous distention or carotid bruit.  CHEST:  Clear to auscultation.  HEART:  Regular rate and rhythm without murmurs, gallops, or rubs.  ABDOMEN:   Moderately distended with diffuse tenderness. There was normal  bowel sounds and no guarding.  EXTREMITIES:  Without clubbing, cyanosis, or edema.   LABORATORY DATA:  EKG showed normal sinus rhythm. Serum sodium 137,  potassium 4.2, chloride 107, CO2 24, BUN 27, creatinine 1.2, glucose 166.  White blood cell count 11.3, hemoglobin 13.4, platelets 235. Cardiac  isoenzymes were normal. Acute abdominal series showed dilated small bowel  and stomach consistent with a small-bowel obstruction. Chest x-ray showed  low lung volumes with atelectasis.   HOSPITAL COURSE:  The patient was admitted to a medical bed without  telemetry. He was treated with several soap suds enemas and diluted  Gastrografin contrast per NG tube. This resulted in several loose bowel  movements and resolution of abdominal discomfort. The nasogastric tube was  clamped, and he was able to eat a regular diet without recurrence of nausea  or vomiting. His abdominal pain resolved entirely.   CONDITION ON DISCHARGE:  On the day of discharge, he  felt at his baseline.  He was able to eat meals yesterday afternoon and this morning without any  difficulty at all. He no longer has any abdominal pain or nausea. He has had  several loose bowel movements that are brown in color.   Most recent vital signs included a blood pressure of 161/74, pulse 54,  respirations 20, temperature 97.9, pulse oximetry was 95% on room air. White  blood cell count 5.5, hemoglobin 12. Serum sodium 137, potassium 4.2,  chloride 17, creatinine 1.0, and fasting blood sugars have been 107, 116,  and 100. A chest x-ray exam had very minimal scattered wheezing, but he was  not complaining of any shortness of breath.   DISCHARGE DIAGNOSES:  1. Small-bowel obstruction.  2. Severe constipation.  3. Hyperlipidemia.  4. Hypertension.  5. Osteoarthritis.  6. Muscle cramps.  7. Chronic obstructive pulmonary disease.  8. Transient hyperglycemia.  9.  Osteoarthritis of the right shoulder.  10.      Lumbar spine osteoarthritis.  11.      History of nephrolithiasis.  12.      Status post 1995 coronary artery bypass graft surgery.   DISCHARGE MEDICATIONS:  1. Pravachol 20 mg p.o. q.d.  2. Diovan 80 mg p.o. q.d.  3. Hydrochlorothiazide 12.5 mg p.o. q.d.  4. Toprol-XL 25 mg 1 half tablet p.o. q.d.  5. Ecotrin 325 mg p.o. q.d.  6. Darvocet-N 100 1 tablet p.o. t.i.d. p.r.n. pain.  7. Senna S 1 to 2 tablets p.o. q.d. p.r.n. constipation.  8. Tums 2 tablets p.o. b.i.d.  9. Nitroglycerin 0.4 mg sublingual p.r.n. chest pain.  10.      Quinine 325 mg p.o. q.h.s. p.r.n. muscle cramps.  11.      Prednisone 20 mg 1 tablet p.o. q.d. for 7 days.   FOLLOWUP PLANS:  He should be seen in one to two weeks by Dr. Eloise Harman at  [facility], and he was given a telephone number to call for  that appointment.                                                Barry Dienes Eloise Harman, M.D.    DGP/MEDQ  D:  03/19/2004  T:  03/19/2004  Job:  [id number]   cc:   Vania Rea. Jarold Motto, M.D. [facility]   Kinnie Scales. Annalee Genta, M.D.  [address] [place]  [place] [address]  Fax: [phone]   Claudette Laws, M.D.  [address]  [place]  [place] [address]  Fax: [phone]   Areatha Keas, M.D.  [address]  [address]  [place]  [place] [address]  Fax: [phone]   Jesse Sans. Wall, M.D.

## 2011-04-23 NOTE — Op Note (Signed)
NAME:  [name], [name]                         ACCOUNT NO.:  [id number]   MEDICAL RECORD NO.:  [id number]                   PATIENT TYPE:  AMB   LOCATION:  DSC                                  FACILITY:  [facility]   PHYSICIAN:  [name]., M.D.          DATE OF BIRTH:  1927-06-30   DATE OF PROCEDURE:  06/23/2004  DATE OF DISCHARGE:                                 OPERATIVE REPORT   PREOPERATIVE DIAGNOSIS:  Chronic stage II impingement, right shoulder, with  MRI evidence of possible full-thickness rotator cuff tear at  musculotendinous junction and MRI evidence of extensive partial bursal side-  tearing and deep-surface tendinopathy of supraspinatus and infraspinatus  tendons.   POSTOPERATIVE DIAGNOSIS:  Deep-surface rotator cuff tear of supraspinatus  and infraspinatus, thoroughly debrided arthroscopically, as well as  extensive tendinopathy of the infraspinatus and supraspinatus tendons deep  to the acromioclavicular joint.   OPERATIONS:  1. Diagnostic arthroscopy, right glenohumeral joint.  2. Arthroscopic debridement of labrum and deep-surface rotator cuff tear.  3. Arthroscopic subacromial decompression with coracoacromial ligament     resection, bursectomy and acromioplasty.  4. Open resection of distal clavicle, that is, Mumford procedure.   OPERATING SURGEON:  Richard Fitch. Sypher, M.D.   ASSISTANT:  [name], M.D.   ANESTHESIA:  General endotracheal supplemented by interscalen block.   SUPERVISING ANESTHESIOLOGIST:  [name], M.D.   INDICATIONS:  [name] is a 77 year old gentleman referred by Dr.  Barry Dienes. [name] for evaluation and management of a painful right  shoulder.   He was initially evaluated by [name] on Apr 20, 2004.   Clinical examination at that time revealed evidence of probable AC arthritis  with secondary impingement.   [name] advised a trial of Relafen and injected the [name] joint with  Celestone, lidocaine and Marcaine.   [name] started a course of therapy under the supervision of [name] ``  and after 1 month, was noted to have mild improvement in his pain but still  had difficulty with abduction.  He had diminished his oral anti-inflammatory  medications and due to persistent pain, [name] elected to proceed with an  MRI evaluation of the right shoulder.   [name] returned on June 15, 2004 for a consultation with [name] and  review of his MRI data.   He was noted to have severe AC arthropathy with an unfavorable morphology  and signs of a possible full-thickness tear at the musculotendinous junction  of the supraspinatus.  There was extensive fluid in the subacromial and  subdeltoid bursa.   [name] referred [name] for an upper extremity orthopedic consult  with myself and clinical examination revealed evidence of persistent stage  II impingement and weakness of abduction consistent with advanced pathology  of the rotator cuff.   The plain films, MRI and clinical exam were reviewed in detail and I advised  [name] to proceed with  arthroscopic evaluation of the shoulder,  anticipating subacromial decompression and distal clavicle resection with  possible repair of the rotator cuff, should we identify a significant tear  at the time of arthroscopy.   Preoperatively, [name] was advised of the potential risks and benefits  of surgery, specific risks being infection, possible neurovascular injury or  failure to detect all the pathology with the arthroscope.  Potential  benefits would be improved range of motion, pain relief and comfort while  sleeping.   After informed consent, he is brought to the operating room at this time.   PROCEDURE:  [name] was brought to the operating room and placed in  supine position upon the operating table.   Following interscalen block in the holding area by [name], anesthesia was  satisfactory in the right forequarter.    Following the induction of general orotracheal anesthesia, he was carefully  positioned in the beach chair position with the aid of the torso and head  holder designed for shoulder arthroscopy.   Procedure commenced with examination of the shoulder under anesthesia.  He  was noted to have near full range of motion with pressure at approximately  160 degrees of elevation due to impingement beneath the coracoacromial  ligament and AC joint.  There was no sign of adhesive capsulitis.   The shoulder and arm were prepped Duraprep and draped with impervious  arthroscopy drapes.   The shoulder was distended with 20 mL of sterile saline by placing a spinal  needle posteriorly.   The scope was then introduced with blunt technique without difficulty.   Diagnostic arthroscopy revealed intact __________ articular cartilage  surfaces on the glenoid and humeral head.   There was a flap tear of the labrum anteriorly at 2 o'clock.  The deep  surface of the supraspinatus and infraspinatus were well-visualized and  found to have about 50% thickness tendinopathy.  The long head of the biceps  had an intact anchor at the glenoid and superior labrum and was normal  through the rotator interval.  The subscapularis was normal in appearance  with minimal synovitis anteriorly.  The inferior recess was normal.   An anterior portal was created under direct vision, followed by use of a 4.5-  mm suction shaver to debride the deep surface of the supraspinatus and  infraspinatus down to a stable margin.  I could not identify a full-  thickness tear arthroscopically from the glenohumeral side.   The scope was then removed from the glenohumeral joint and placed in the  subacromial space.  A florid bursitis was noted.   Extensive bursectomy allowed visualization of the coracoacromial arch and  the coracoacromial ligament.  The anatomy of the anterior acromion was defined and the cutting cautery was  used to  release the coracoacromial ligament.  The suction shaver was used to  remove a 1-cm band of the ligament.   The capsule of the [facility] joint had been violated due to years of impingement.  The distal clavicle was quite prominent.   The cautery was used to obtain hemostasis on the remnants of the inferior  capsule of the [facility] joint, followed by use of a suction bur to level the  acromion to a type 1 morphology with lateral and posterior debridement.  A  small medial osteophyte on the medial margin of the AC joint was removed  with a posterior approach with the bur visualizing laterally.   We removed approximately 50% of the distal clavicle arthroscopically,  however, due to the fact that Mr. Haydon's clavicle was nearly 2 cm in  width and due to the fact that visualization was somewhat challenged due to  unstable blood pressure, we proceeded with completion of the distal clavicle  resection by an open Mumford procedure.   The arthroscopic equipment was removed from the subacromial space after  documentation of satisfactory decompression.  A 2-cm incision was fashioned  over the distal clavicle, followed by subperiosteal dissection of the distal  18 mm of the clavicle.  The capsule was incised with a 15 blade.  The  oscillating saw was used to remove the distal 18 mm of clavicle and a  rongeur was used to smooth the margins of resection.  The acromioplasty was  palpated and the cuff inspected visually and by direct palpation.  No full-  thickness tear at the musculotendinous junction was identified, although  there was considerable tendinopathy due to chronic impingement beneath the  [facility] joint.   The anterior deltoid and trapezius muscles were repaired with a mattress  suture of #2 FiberWire, followed by repair of the subcutaneous tissues with  0 Vicryl and intradermal 3-0 Prolene.  A Steri-Strip was applied to the  incision.   The portals were repaired with intradermal 3-0 Prolene and  Xeroflo.  A  voluminous gauze dressing was applied with Hypafix.  There were no apparent  complications.   Mr. Jeziorski tolerated surgery and anesthesia well.  He was transferred to  the recovery room with stable vital signs.                                               Richard Fitch Naaman [name]., M.D.    RVS/MEDQ  D:  06/23/2004  T:  06/24/2004  Job:  [id number]

## 2011-04-23 NOTE — Op Note (Signed)
   [name], Richard NO.:  [id number]   MEDICAL RECORD NO.:  [id number]                   PATIENT TYPE:  OIB   LOCATION:  NA                                   FACILITY:  [facility]   PHYSICIAN:  Lorre Munroe., M.D.            DATE OF BIRTH:  06/21/1927   DATE OF PROCEDURE:  08/30/2003  DATE OF DISCHARGE:                                 OPERATIVE REPORT   PREOPERATIVE DIAGNOSIS:  Incisional hernia.   POSTOPERATIVE DIAGNOSIS:  Incisional hernia.   OPERATION:  Repair of incisional hernia.   SURGEON:  Lebron Conners, M.D.   ANESTHESIA:  Local with sedation and monitoring by anesthesia.   DESCRIPTION OF PROCEDURE:  After the patient was monitored and anesthetized  and had routine prepping and draping of the upper  abdomen, I liberally  infused 1% Lidocaine with epinephrine into the skin and subcutaneous tissue  and fascia around the area of  the bulge in the area of the upper midline. I  then made a vertical incision directly over the mass and found it to be fat  and separated it from the normal surrounding  subcutaneous tissue, getting  hemostasis with the cautery.   I then delineated the fascia all around it and made a slight cut in the  fascia cephalad to the bulge and reduced  it. It seemed to be entirely  preperitoneal fat rather than containing  any abdominal viscera.   I then freed up the subcutaneous tissues from the fascia for 1 to 2 cm in  all directions.  I took a 1 x 4 inch piece of polypropylene mesh, placed it  underneath the fascia and spread it out and then closed  the hernia defect  with running 0 Prolene suture, incorporating  the mesh into the repair in  the central portion. I felt that the repair was solid.   I got hemostasis again after  irrigating  the subcutaneous tissues and I  closed the subcutaneous tissues with running 3-0 Vicryl and then closed the  skin  with a running intracuticular 4-0 Vicryl and Steri-Strips. The  patient  was stable through the procedure and went to the PACU in good condition.                                                Lorre Munroe., M.D.    Jodi Marble  D:  08/30/2003  T:  08/31/2003  Job:  [id number]   cc:   Thomas C. Wall, M.D.   Barry Dienes Eloise Harman, M.D.  [address]  [place]  [place] [address]  Fax: [phone]

## 2011-12-20 ENCOUNTER — Encounter: Payer: Self-pay | Admitting: *Deleted

## 2011-12-21 ENCOUNTER — Ambulatory Visit (INDEPENDENT_AMBULATORY_CARE_PROVIDER_SITE_OTHER): Payer: Medicare Other | Admitting: Cardiology

## 2011-12-21 ENCOUNTER — Encounter: Payer: Self-pay | Admitting: Cardiology

## 2011-12-21 VITALS — BP 132/68 | HR 63 | Ht 72.0 in | Wt 182.0 lb

## 2011-12-21 DIAGNOSIS — I2510 Atherosclerotic heart disease of native coronary artery without angina pectoris: Secondary | ICD-10-CM

## 2011-12-21 DIAGNOSIS — R0989 Other specified symptoms and signs involving the circulatory and respiratory systems: Secondary | ICD-10-CM

## 2011-12-21 NOTE — Progress Notes (Signed)
HPI Mr. Holderness returns for evaluation and management his coronary disease. His blood work and other medical problems followed by Dr. Jarold Motto.  He's had shingles of the right face and eyes the past year. He has been absolutely miserable. He's lost a substantial amount of weight.  Denies any angina or chest pain. He denies any symptoms of TIAs or mini strokes.  He is compliant with his medications.  Past Medical History  Diagnosis Date  . Coronary atherosclerosis of artery bypass graft   . Cerebrovascular disease, unspecified   . Unspecified essential hypertension   . Other and unspecified hyperlipidemia   . Type II or unspecified type diabetes mellitus without mention of complication, not stated as uncontrolled     Current Outpatient Prescriptions  Medication Sig Dispense Refill  . aspirin 325 MG tablet Take 325 mg by mouth daily.      Marland Kitchen losartan (COZAAR) 100 MG tablet Take 1 tablet by mouth Daily.      . meloxicam (MOBIC) 15 MG tablet Take 1 tablet by mouth Daily.      . metFORMIN (GLUCOPHAGE) 500 MG tablet Take 500 mg by mouth 2 (two) times daily with a meal.      . metoprolol succinate (TOPROL-XL) 25 MG 24 hr tablet Take 25 mg by mouth daily.       . nitroGLYCERIN (NITROSTAT) 0.4 MG SL tablet Place 0.4 mg under the tongue every 5 (five) minutes as needed.      . pravastatin (PRAVACHOL) 40 MG tablet Take 40 mg by mouth daily.      . predniSONE (DELTASONE) 10 MG tablet as directed.        Allergies  Allergen Reactions  . Azithromycin   . Macrodantin   . Minocin   . Minocycline Hcl   . Nitrofurantoin   . Zithromax (Azithromycin Dihydrate)     Family History  Problem Relation Age of Onset  . Coronary artery disease    . Alcohol abuse      History   Social History  . Marital Status: Married    Spouse Name: N/A    Number of Children: N/A  . Years of Education: N/A   Occupational History  . retired [employer]   Social History Main Topics  . Smoking status:  Former Games developer  . Smokeless tobacco: Never Used   Comment: quit in 1986  . Alcohol Use: No  . Drug Use: No  . Sexually Active: Not on file   Other Topics Concern  . Not on file   Social History Narrative  . No narrative on file    ROS ALL NEGATIVE EXCEPT THOSE NOTED IN HPI  PE  General Appearance: well developed, well nourished in no acute distress,thin, elderly HEENT: symmetrical face, PERRLA, good dentition  Neck: no JVD, thyromegaly, or adenopathy, trachea midline Chest: symmetric without deformity Cardiac: PMI non-displaced, RRR, normal S1, S2, no gallop , soft systolic murmur left sternal border Lung: clear to ausculation and percussion Vascular: all pulses full , soft bilateral carotid bruits versus referred sounds Abdominal: nondistended, nontender, good bowel sounds, no HSM, no bruits Extremities: no cyanosis, clubbing or edema, no sign of DVT, no varicosities  Skin: normal color, no rashes Neuro: alert and oriented x 3, non-focal Pysch: normal affect  EKG Normal sinus rhythm with PACs, otherwise normal BMET    Component Value Date/Time   NA 140 09/26/2010 1312   K 3.9 09/26/2010 1312   CL 108 09/26/2010 1312   CO2 24 09/26/2010 1312  GLUCOSE 204* 09/26/2010 1312   BUN 18 09/26/2010 1312   CREATININE 1.03 09/26/2010 1312   CALCIUM 9.3 09/26/2010 1312   GFRNONAA >60 09/26/2010 1312   GFRAA  Value: >60        The eGFR has been calculated using the MDRD equation. This calculation has not been validated in all clinical situations. eGFR's persistently <60 mL/min signify possible Chronic Kidney Disease. 09/26/2010 1312    Lipid Panel     Component Value Date/Time   CHOL 133 02/22/2008 0928   TRIG 216* 02/22/2008 0928   HDL 31.6* 02/22/2008 0928   CHOLHDL 4.2 CALC 02/22/2008 0928   VLDL 43* 02/22/2008 0928    CBC    Component Value Date/Time   WBC 9.0 09/26/2010 1312   RBC 3.86* 09/26/2010 1312   HGB 12.8* 09/26/2010 1312   HCT 38.1* 09/26/2010 1312   PLT  135* 09/26/2010 1312   MCV 98.7 09/26/2010 1312   MCH 33.2 09/26/2010 1312   MCHC 33.6 09/26/2010 1312   RDW 13.3 09/26/2010 1312   LYMPHSABS 0.7 09/26/2010 1312   MONOABS 0.5 09/26/2010 1312   EOSABS 0.1 09/26/2010 1312   BASOSABS 0.0 09/26/2010 1312

## 2011-12-21 NOTE — Assessment & Plan Note (Signed)
Stable.  Continue current medications.

## 2011-12-21 NOTE — Assessment & Plan Note (Signed)
Asymptomatic but new finding. We'll obtain Dopplers. Continue secondary preventive therapy.

## 2011-12-21 NOTE — Patient Instructions (Signed)
Your physician has requested that you have a carotid duplex. This test is an ultrasound of the carotid arteries in your neck. It looks at blood flow through these arteries that supply the brain with blood. Allow one hour for this exam. There are no restrictions or special instructions.  Your physician wants you to follow-up in: 12 months. You will receive a reminder letter in the mail two months in advance. If you don't receive a letter, please call our office to schedule the follow-up appointment.  

## 2011-12-24 NOTE — Progress Notes (Signed)
Addended by: Judithe Modest D on: 12/24/2011 12:09 PM   Modules accepted: Orders

## 2012-01-07 ENCOUNTER — Encounter (INDEPENDENT_AMBULATORY_CARE_PROVIDER_SITE_OTHER): Payer: Medicare Other | Admitting: *Deleted

## 2012-01-07 DIAGNOSIS — R0989 Other specified symptoms and signs involving the circulatory and respiratory systems: Secondary | ICD-10-CM

## 2012-01-07 DIAGNOSIS — I6529 Occlusion and stenosis of unspecified carotid artery: Secondary | ICD-10-CM

## 2012-02-02 ENCOUNTER — Inpatient Hospital Stay (HOSPITAL_COMMUNITY)
Admission: EM | Admit: 2012-02-02 | Discharge: 2012-02-04 | DRG: 249 | Disposition: A | Discharge status: Home / Self Care | Payer: Medicare Other | Attending: Cardiovascular Disease | Admitting: Cardiovascular Disease

## 2012-02-02 ENCOUNTER — Encounter (HOSPITAL_COMMUNITY): Payer: Self-pay

## 2012-02-02 ENCOUNTER — Emergency Department (HOSPITAL_COMMUNITY): Payer: Medicare Other

## 2012-02-02 DIAGNOSIS — B029 Zoster without complications: Secondary | ICD-10-CM | POA: Diagnosis present

## 2012-02-02 DIAGNOSIS — Z7982 Long term (current) use of aspirin: Secondary | ICD-10-CM

## 2012-02-02 DIAGNOSIS — Z79899 Other long term (current) drug therapy: Secondary | ICD-10-CM

## 2012-02-02 DIAGNOSIS — D649 Anemia, unspecified: Secondary | ICD-10-CM | POA: Diagnosis present

## 2012-02-02 DIAGNOSIS — I509 Heart failure, unspecified: Secondary | ICD-10-CM

## 2012-02-02 DIAGNOSIS — I359 Nonrheumatic aortic valve disorder, unspecified: Secondary | ICD-10-CM

## 2012-02-02 DIAGNOSIS — I25810 Atherosclerosis of coronary artery bypass graft(s) without angina pectoris: Secondary | ICD-10-CM | POA: Diagnosis present

## 2012-02-02 DIAGNOSIS — E119 Type 2 diabetes mellitus without complications: Secondary | ICD-10-CM | POA: Diagnosis present

## 2012-02-02 DIAGNOSIS — Z7902 Long term (current) use of antithrombotics/antiplatelets: Secondary | ICD-10-CM

## 2012-02-02 DIAGNOSIS — I10 Essential (primary) hypertension: Secondary | ICD-10-CM | POA: Diagnosis present

## 2012-02-02 DIAGNOSIS — I200 Unstable angina: Secondary | ICD-10-CM

## 2012-02-02 DIAGNOSIS — I679 Cerebrovascular disease, unspecified: Secondary | ICD-10-CM | POA: Diagnosis present

## 2012-02-02 DIAGNOSIS — Z888 Allergy status to other drugs, medicaments and biological substances status: Secondary | ICD-10-CM

## 2012-02-02 DIAGNOSIS — I214 Non-ST elevation (NSTEMI) myocardial infarction: Principal | ICD-10-CM | POA: Diagnosis present

## 2012-02-02 DIAGNOSIS — I658 Occlusion and stenosis of other precerebral arteries: Secondary | ICD-10-CM | POA: Diagnosis present

## 2012-02-02 DIAGNOSIS — I6529 Occlusion and stenosis of unspecified carotid artery: Secondary | ICD-10-CM | POA: Diagnosis present

## 2012-02-02 DIAGNOSIS — Z87891 Personal history of nicotine dependence: Secondary | ICD-10-CM

## 2012-02-02 DIAGNOSIS — I2510 Atherosclerotic heart disease of native coronary artery without angina pectoris: Secondary | ICD-10-CM | POA: Diagnosis present

## 2012-02-02 DIAGNOSIS — E785 Hyperlipidemia, unspecified: Secondary | ICD-10-CM | POA: Diagnosis present

## 2012-02-02 HISTORY — DX: Atherosclerotic heart disease of native coronary artery without angina pectoris: I25.10

## 2012-02-02 HISTORY — DX: Anemia, unspecified: D64.9

## 2012-02-02 HISTORY — DX: Benign prostatic hyperplasia without lower urinary tract symptoms: N40.0

## 2012-02-02 HISTORY — DX: Unspecified asthma, uncomplicated: J45.909

## 2012-02-02 LAB — GLUCOSE, CAPILLARY: Glucose-Capillary: 142 mg/dL — ABNORMAL HIGH (ref 70–99)

## 2012-02-02 LAB — CARDIAC PANEL(CRET KIN+CKTOT+MB+TROPI)
CK, MB: 3.5 ng/mL (ref 0.3–4.0)
CK, MB: 3.8 ng/mL (ref 0.3–4.0)
Relative Index: INVALID (ref 0.0–2.5)
Total CK: 67 U/L (ref 7–232)
Total CK: 64 U/L (ref 7–232)

## 2012-02-02 LAB — URINALYSIS, ROUTINE W REFLEX MICROSCOPIC
Bilirubin Urine: NEGATIVE
Hgb urine dipstick: NEGATIVE
Nitrite: NEGATIVE
Protein, ur: 30 mg/dL — AB
Specific Gravity, Urine: 1.014 (ref 1.005–1.030)
Urobilinogen, UA: 0.2 mg/dL (ref 0.0–1.0)

## 2012-02-02 LAB — COMPREHENSIVE METABOLIC PANEL
Alkaline Phosphatase: 103 U/L (ref 39–117)
BUN: 25 mg/dL — ABNORMAL HIGH (ref 6–23)
CO2: 21 mEq/L (ref 19–32)
Chloride: 108 mEq/L (ref 96–112)
Creatinine, Ser: 1.12 mg/dL (ref 0.50–1.35)
GFR calc non Af Amer: 59 mL/min — ABNORMAL LOW (ref >90)
Glucose, Bld: 181 mg/dL — ABNORMAL HIGH (ref 70–99)
Potassium: 4.3 mEq/L (ref 3.5–5.1)
Total Bilirubin: 0.5 mg/dL (ref 0.3–1.2)

## 2012-02-02 LAB — SAMPLE TO BLOOD BANK

## 2012-02-02 LAB — DIFFERENTIAL
Basophils Relative: 0 % (ref 0–1)
Eosinophils Relative: 2 % (ref 0–5)
Lymphs Abs: 1 10*3/uL (ref 0.7–4.0)
Monocytes Absolute: 0.3 10*3/uL (ref 0.1–1.0)
Neutro Abs: 4.1 10*3/uL (ref 1.7–7.7)

## 2012-02-02 LAB — LIPID PANEL
HDL: 24 mg/dL — ABNORMAL LOW (ref >39)
Total CHOL/HDL Ratio: 4.6 RATIO
Triglycerides: 76 mg/dL (ref <150)

## 2012-02-02 LAB — IRON AND TIBC: UIBC: 238 ug/dL (ref 125–400)

## 2012-02-02 LAB — CBC
HCT: 26.8 % — ABNORMAL LOW (ref 39.0–52.0)
Hemoglobin: 8.5 g/dL — ABNORMAL LOW (ref 13.0–17.0)
MCH: 29.7 pg (ref 26.0–34.0)
MCHC: 31.7 g/dL (ref 30.0–36.0)
MCV: 93.7 fL (ref 78.0–100.0)

## 2012-02-02 LAB — HEPATIC FUNCTION PANEL
ALT: 10 U/L (ref 0–53)
Bilirubin, Direct: 0.1 mg/dL (ref 0.0–0.3)
Indirect Bilirubin: 0.4 mg/dL (ref 0.3–0.9)
Total Protein: 6.1 g/dL (ref 6.0–8.3)

## 2012-02-02 LAB — OCCULT BLOOD, POC DEVICE: Fecal Occult Bld: NEGATIVE

## 2012-02-02 LAB — URINE MICROSCOPIC-ADD ON

## 2012-02-02 LAB — PROTIME-INR: Prothrombin Time: 14.6 seconds (ref 11.6–15.2)

## 2012-02-02 LAB — APTT: aPTT: 29 seconds (ref 24–37)

## 2012-02-02 LAB — TSH: TSH: 1.29 u[IU]/mL (ref 0.350–4.500)

## 2012-02-02 LAB — FERRITIN: Ferritin: 69 ng/mL (ref 22–322)

## 2012-02-02 MED ORDER — ACETAMINOPHEN 325 MG PO TABS
650.0000 mg | ORAL_TABLET | ORAL | Status: DC | PRN
  Administered 2012-02-03: 325 mg via ORAL
  Filled 2012-02-02: qty 1

## 2012-02-02 MED ORDER — SODIUM CHLORIDE 0.9 % IV SOLN: 250.0000 mL | INTRAVENOUS | Status: DC | PRN

## 2012-02-02 MED ORDER — ONDANSETRON HCL 4 MG/2ML IJ SOLN: 4.0000 mg | Freq: Four times a day (QID) | INTRAMUSCULAR | Status: DC | PRN

## 2012-02-02 MED ORDER — HYDROCODONE-ACETAMINOPHEN 5-325 MG PO TABS
1.0000 | ORAL_TABLET | Freq: Three times a day (TID) | ORAL | Status: DC | PRN
  Administered 2012-02-02 – 2012-02-04 (×4): 1 via ORAL
  Filled 2012-02-02 (×4): qty 1

## 2012-02-02 MED ORDER — SIMVASTATIN 20 MG PO TABS
20.0000 mg | ORAL_TABLET | Freq: Every day | ORAL | Status: DC
  Administered 2012-02-02 – 2012-02-03 (×2): 20 mg via ORAL
  Filled 2012-02-02 (×4): qty 1

## 2012-02-02 MED ORDER — ZOLPIDEM TARTRATE 5 MG PO TABS: 5.0000 mg | ORAL_TABLET | Freq: Every evening | ORAL | Status: DC | PRN

## 2012-02-02 MED ORDER — SODIUM CHLORIDE 0.9 % IJ SOLN
3.0000 mL | INTRAMUSCULAR | Status: DC | PRN
  Administered 2012-02-04: 3 mL via INTRAVENOUS

## 2012-02-02 MED ORDER — NITROGLYCERIN 0.4 MG SL SUBL: 0.4000 mg | SUBLINGUAL_TABLET | SUBLINGUAL | Status: DC | PRN

## 2012-02-02 MED ORDER — SODIUM CHLORIDE 0.9 % IJ SOLN
3.0000 mL | Freq: Two times a day (BID) | INTRAMUSCULAR | Status: DC
  Administered 2012-02-03: 3 mL via INTRAVENOUS

## 2012-02-02 MED ORDER — GABAPENTIN 300 MG PO CAPS
300.0000 mg | ORAL_CAPSULE | Freq: Three times a day (TID) | ORAL | Status: DC
  Administered 2012-02-02 – 2012-02-04 (×4): 300 mg via ORAL
  Filled 2012-02-02 (×10): qty 1

## 2012-02-02 MED ORDER — FUROSEMIDE 10 MG/ML IJ SOLN
60.0000 mg | Freq: Once | INTRAMUSCULAR | Status: AC
  Administered 2012-02-02: 60 mg via INTRAVENOUS
  Filled 2012-02-02: qty 6

## 2012-02-02 MED ORDER — ALPRAZOLAM 0.25 MG PO TABS: 0.2500 mg | ORAL_TABLET | Freq: Two times a day (BID) | ORAL | Status: DC | PRN

## 2012-02-02 MED ORDER — METOPROLOL SUCCINATE ER 25 MG PO TB24
25.0000 mg | ORAL_TABLET | Freq: Every day | ORAL | Status: DC
  Administered 2012-02-03 – 2012-02-04 (×2): 25 mg via ORAL
  Filled 2012-02-02 (×3): qty 1

## 2012-02-02 MED ORDER — HEPARIN SOD (PORCINE) IN D5W 100 UNIT/ML IV SOLN
1000.0000 [IU]/h | INTRAVENOUS | Status: DC
  Filled 2012-02-02: qty 250

## 2012-02-02 MED ORDER — NITROGLYCERIN IN D5W 200-5 MCG/ML-% IV SOLN: 5.0000 ug/min | Freq: Once | INTRAVENOUS | Status: DC

## 2012-02-02 MED ORDER — NITROGLYCERIN IN D5W 200-5 MCG/ML-% IV SOLN
5.0000 ug/min | Freq: Once | INTRAVENOUS | Status: AC
  Administered 2012-02-02: 5 ug/min via INTRAVENOUS
  Filled 2012-02-02: qty 250

## 2012-02-02 MED ORDER — INSULIN ASPART 100 UNIT/ML ~~LOC~~ SOLN
0.0000 [IU] | Freq: Three times a day (TID) | SUBCUTANEOUS | Status: DC
  Administered 2012-02-03 – 2012-02-04 (×2): 2 [IU] via SUBCUTANEOUS
  Filled 2012-02-02: qty 3

## 2012-02-02 MED ORDER — HEPARIN BOLUS VIA INFUSION: 4000.0000 [IU] | Freq: Once | INTRAVENOUS | Status: DC

## 2012-02-02 MED ORDER — LOSARTAN POTASSIUM 50 MG PO TABS
100.0000 mg | ORAL_TABLET | Freq: Every day | ORAL | Status: DC
  Administered 2012-02-03 – 2012-02-04 (×2): 100 mg via ORAL
  Filled 2012-02-02 (×3): qty 2

## 2012-02-02 NOTE — ED Notes (Signed)
Pt noted to have a bruise on his LLQ of abdomen. Pt unaware of this and has no known cause.

## 2012-02-02 NOTE — ED Notes (Signed)
Pt presents with 2 week h/o intermittent chest pain that has been relieved with NTG.  Today, pt reports onset of L sided chest pain that began at 0530 today.  Pt took NTG x 1 and ASA at home with 10/10 pain, EMS gave 2 additional NTG with pain subsiding to 2/10.  +shortness of breath, -nausea.  PIV to L FA. EKG shows lateral ischemia.

## 2012-02-02 NOTE — ED Notes (Signed)
Chauncy Passy aware of critical I stat troponin level of 0.23

## 2012-02-02 NOTE — ED Notes (Signed)
Meal ordered

## 2012-02-02 NOTE — ED Provider Notes (Signed)
History     CSN: [id number]  Arrival date & time 02/02/12  4098   First MD Initiated Contact with Patient 02/02/12 [phone]      Chief Complaint  Patient presents with  . Chest Pain    (Consider location/radiation/quality/duration/timing/severity/associated sxs/prior treatment) HPI  Patient relates he had coronary artery bypass graft done in 1995 and has done well. He was last seen by Dr. Daleen Squibb on January 15. He states for the past 2-3 weeks he started having chest pain that is exertional. He states he also is getting shortness of breath with it. He states the amount of exertion that causes the pain is getting less. He states if he sits down the pain will get better. He states however the pain was getting worse and he started having to use nitroglycerin yesterday. He states this morning about 5 AM he woke up with diffuse anterior chest pain but he states is a pressure feeling. His wife reports he was diaphoretic. He denies nausea or vomiting. Patient took one aspirin 325 mg and one nitroglycerin before calling EMS. EMS gave him 2 more nitroglycerin and his pain from a level 10 to a current level II. Patient appears pale however he denies any abdominal pain or melena or blood in his stool. Patient states he has an appointment to see Dr. wall the office tomorrow.  PCP Dr. Eloise Harman Cardiologist Dr. Daleen Squibb  Past Medical History  Diagnosis Date  . Coronary atherosclerosis of artery bypass graft   . Cerebrovascular disease, unspecified   . Unspecified essential hypertension   . Other and unspecified hyperlipidemia   . Type II or unspecified type diabetes mellitus without mention of complication, not stated as uncontrolled   . Osteoarthritis (arthritis due to wear and tear of joints)   . Asthmatic bronchitis   . BPH (benign prostatic hypertrophy)   . Herpes zoster   Shingles right face since May 2012  Past Surgical History  Procedure Date  . Coronary artery bypass graft 1995  . Rotator cuff  repair   . Carpal tunnel release   . Incisional hernia repair   . Kidney surgery   . Back surgery     Family History  Problem Relation Age of Onset  . Coronary artery disease    . Alcohol abuse      History  Substance Use Topics  . Smoking status: Former Games developer  . Smokeless tobacco: Never Used   Comment: quit in 1986  . Alcohol Use: No   Lives with spouse   Review of Systems  All other systems reviewed and are negative.    Allergies  Azithromycin; Macrodantin; Minocin; Minocycline hcl; Nitrofurantoin; and Zithromax  Home Medications   Current Outpatient Rx  Name Route Sig Dispense Refill  . ASPIRIN 325 MG PO TABS Oral Take 325 mg by mouth daily.    Marland Kitchen LOSARTAN POTASSIUM 100 MG PO TABS Oral Take 1 tablet by mouth Daily.    . MELOXICAM 15 MG PO TABS Oral Take 1 tablet by mouth Daily.    Marland Kitchen METFORMIN HCL 500 MG PO TABS Oral Take 500 mg by mouth 2 (two) times daily with a meal.    . METOPROLOL SUCCINATE ER 25 MG PO TB24 Oral Take 25 mg by mouth daily.     Marland Kitchen NITROGLYCERIN 0.4 MG SL SUBL Sublingual Place 0.4 mg under the tongue every 5 (five) minutes as needed.    Marland Kitchen PENICILLIN V POTASSIUM 500 MG PO TABS Oral Take 500 mg by mouth 4 (  four) times daily. For tooth abscess for 6 days beginning 01/24/12    . PRAVASTATIN SODIUM 40 MG PO TABS Oral Take 40 mg by mouth daily.      BP 146/68  Pulse 83  Temp(Src) 97.9 F (36.6 C) (Oral)  Resp 24  Ht 6\' 1"  (1.854 m)  Wt 185 lb (83.915 kg)  BMI 24.41 kg/m2  SpO2 99%  Vital signs normal except for tachypnea   Physical Exam  Nursing note and vitals reviewed. Constitutional: He is oriented to person, place, and time. He appears well-developed and well-nourished.  Non-toxic appearance. He does not appear ill. No distress.  HENT:  Head: Normocephalic and atraumatic.  Right Ear: External ear normal.  Left Ear: External ear normal.  Nose: Nose normal. No mucosal edema or rhinorrhea.  Mouth/Throat: Oropharynx is clear and moist and  mucous membranes are normal. No dental abscesses or uvula swelling.  Eyes: Conjunctivae and EOM are normal. Pupils are equal, round, and reactive to light.  Neck: Normal range of motion and full passive range of motion without pain. Neck supple.  Cardiovascular: Normal rate, regular rhythm and normal heart sounds.  Exam reveals no gallop and no friction rub.   No murmur heard. Pulmonary/Chest: Breath sounds normal. He is in respiratory distress. He has no wheezes. He has no rhonchi. He has no rales. He exhibits no tenderness and no crepitus.       Patient appears to have some tachypnea. He's noted to have some rhonchi and possible rales at the very lower bases of both lungs  Abdominal: Soft. Normal appearance and bowel sounds are normal. He exhibits no distension. There is no tenderness. There is no rebound and no guarding.  Musculoskeletal: Normal range of motion. He exhibits edema. He exhibits no tenderness.       Moves all extremities well. Patient has pitting edema of both lower legs  Neurological: He is alert and oriented to person, place, and time. He has normal strength. No cranial nerve deficit.  Skin: Skin is warm, dry and intact. No rash noted. No erythema. There is pallor.  Psychiatric: He has a normal mood and affect. His speech is normal and behavior is normal. His mood appears not anxious.    ED Course  Procedures (including critical care time)  Pt started on NTG drip and heparin drip. He has already taken ASA PTA. Pt given lasix for apparent CHF by exam.   1610 Jesse Sans here in ED and notified patient has subendocardial ischemia  Heparin held because of the new anemia, waiting for hemoccult to result and to discuss with cardiology. Rhonda informed of these results.   Rectal done, small ext hemorrhoid, no stool in vault, hemoccult sent to lab  Results for orders placed during the hospital encounter of 02/02/12  CBC      Component Value Range   WBC 5.5  4.0 - 10.5  (K/uL)   RBC 2.86 (*) 4.22 - 5.81 (MIL/uL)   Hemoglobin 8.5 (*) 13.0 - 17.0 (g/dL)   HCT 96.0 (*) 45.4 - 52.0 (%)   MCV 93.7  78.0 - 100.0 (fL)   MCH 29.7  26.0 - 34.0 (pg)   MCHC 31.7  30.0 - 36.0 (g/dL)   RDW 09.8 (*) 11.9 - 15.5 (%)   Platelets 88 (*) 150 - 400 (K/uL)  DIFFERENTIAL      Component Value Range   Neutrophils Relative 74  43 - 77 (%)   Lymphocytes Relative 18  12 - 46 (%)  Monocytes Relative 6  3 - 12 (%)   Eosinophils Relative 2  0 - 5 (%)   Basophils Relative 0  0 - 1 (%)   Neutro Abs 4.1  1.7 - 7.7 (K/uL)   Lymphs Abs 1.0  0.7 - 4.0 (K/uL)   Monocytes Absolute 0.3  0.1 - 1.0 (K/uL)   Eosinophils Absolute 0.1  0.0 - 0.7 (K/uL)   Basophils Absolute 0.0  0.0 - 0.1 (K/uL)   RBC Morphology POLYCHROMASIA PRESENT     WBC Morphology ATYPICAL LYMPHOCYTES    COMPREHENSIVE METABOLIC PANEL      Component Value Range   Sodium 141  135 - 145 (mEq/L)   Potassium 4.3  3.5 - 5.1 (mEq/L)   Chloride 108  96 - 112 (mEq/L)   CO2 21  19 - 32 (mEq/L)   Glucose, Bld 181 (*) 70 - 99 (mg/dL)   BUN 25 (*) 6 - 23 (mg/dL)   Creatinine, Ser 1.61  0.50 - 1.35 (mg/dL)   Calcium 9.5  8.4 - 09.6 (mg/dL)   Total Protein 6.1  6.0 - 8.3 (g/dL)   Albumin 3.4 (*) 3.5 - 5.2 (g/dL)   AST 20  0 - 37 (U/L)   ALT 10  0 - 53 (U/L)   Alkaline Phosphatase 103  39 - 117 (U/L)   Total Bilirubin 0.5  0.3 - 1.2 (mg/dL)   GFR calc non Af Amer 59 (*) >90 (mL/min)   GFR calc Af Amer 69 (*) >90 (mL/min)  APTT      Component Value Range   aPTT 29  24 - 37 (seconds)  PROTIME-INR      Component Value Range   Prothrombin Time 14.6  11.6 - 15.2 (seconds)   INR 1.12  0.00 - 1.49   PRO B NATRIURETIC PEPTIDE      Component Value Range   Pro B Natriuretic peptide (BNP) 5711.0 (*) 0 - 450 (pg/mL)  POCT I-STAT TROPONIN I      Component Value Range   Troponin i, poc 0.23 (*) 0.00 - 0.08 (ng/mL)   Comment NOTIFIED PHYSICIAN     Comment 3           SAMPLE TO BLOOD BANK      Component Value Range   Blood  Bank Specimen SAMPLE AVAILABLE FOR TESTING     Sample Expiration 02/03/2012    OCCULT BLOOD, POC DEVICE      Component Value Range   Fecal Occult Bld NEGATIVE    URINALYSIS, ROUTINE W REFLEX MICROSCOPIC      Component Value Range   Color, Urine YELLOW  YELLOW    APPearance CLEAR  CLEAR    Specific Gravity, Urine 1.014  1.005 - 1.030    pH 5.5  5.0 - 8.0    Glucose, UA NEGATIVE  NEGATIVE (mg/dL)   Hgb urine dipstick NEGATIVE  NEGATIVE    Bilirubin Urine NEGATIVE  NEGATIVE    Ketones, ur NEGATIVE  NEGATIVE (mg/dL)   Protein, ur 30 (*) NEGATIVE (mg/dL)   Urobilinogen, UA 0.2  0.0 - 1.0 (mg/dL)   Nitrite NEGATIVE  NEGATIVE    Leukocytes, UA NEGATIVE  NEGATIVE   HEPATIC FUNCTION PANEL      Component Value Range   Total Protein 6.1  6.0 - 8.3 (g/dL)   Albumin 3.4 (*) 3.5 - 5.2 (g/dL)   AST 20  0 - 37 (U/L)   ALT 10  0 - 53 (U/L)   Alkaline Phosphatase 104  39 -  117 (U/L)   Total Bilirubin 0.5  0.3 - 1.2 (mg/dL)   Bilirubin, Direct 0.1  0.0 - 0.3 (mg/dL)   Indirect Bilirubin 0.4  0.3 - 0.9 (mg/dL)  LIPID PANEL      Component Value Range   Cholesterol 111  0 - 200 (mg/dL)   Triglycerides 76  <161 (mg/dL)   HDL 24 (*) >09 (mg/dL)   Total CHOL/HDL Ratio 4.6     VLDL 15  0 - 40 (mg/dL)   LDL Cholesterol 72  0 - 99 (mg/dL)  URINE MICROSCOPIC-ADD ON      Component Value Range   Squamous Epithelial / LPF RARE  RARE    WBC, UA 0-2  <3 (WBC/hpf)   RBC / HPF 0-2  <3 (RBC/hpf)   Laboratory interpretation all normal except anemia from Oct 2011 when Hb was 12.8, +troponin, elevated BNP HEMOCCULT NEG  Dg Chest Portable 1 View  02/02/2012  *RADIOLOGY REPORT*  Clinical Data: Shortness of breath, chest pain  PORTABLE CHEST - 1 VIEW  Comparison: Chest x-ray of 06/19/2004  Findings: Mild basilar atelectasis is present.  Moderate cardiomegaly is stable.  Median sternotomy sutures are noted from prior CABG. No bony abnormality is seen.  IMPRESSION: Stable cardiomegaly.  No active process.  Minimal  basilar atelectasis.  Original Report Authenticated By: Juline Patch, M.D.     Date: 02/02/2012  Rate: 82  Rhythm: normal sinus rhythm and premature atrial contractions (PAC)  QRS Axis: normal  Intervals: normal  ST/T Wave abnormalities: ST depressions inferiorly and ST depressions laterally  Conduction Disutrbances:none  Narrative Interpretation: Q waves septally  Old EKG Reviewed: changes noted from 09/26/2010 ST depressions new     1. Chest pain   2. NSTEMI (non-ST elevated myocardial infarction)   3. Anemia   4. Congestive heart failure (CHF)     Plan admission   Devoria Albe, MD, FACEP   CRITICAL CARE Performed by: Devoria Albe L   Total critical care time: 31 minutes   Critical care time was exclusive of separately billable procedures and treating other patients.  Critical care was necessary to treat or prevent imminent or life-threatening deterioration.  Critical care was time spent personally by me on the following activities: development of treatment plan with patient and/or surrogate as well as nursing, discussions with consultants, evaluation of patient's response to treatment, examination of patient, obtaining history from patient or surrogate, ordering and performing treatments and interventions, ordering and review of laboratory studies, ordering and review of radiographic studies, pulse oximetry and re-evaluation of patient's condition.    MDM          Ward Givens, MD 02/02/12 [id number]

## 2012-02-02 NOTE — Consult Note (Signed)
CARDIOLOGY CONSULT NOTE   Patient ID: [name] MRN: [id number] DOB/AGE: [birth year] 78 y.o.  Admit date: 02/02/2012  Primary Physician   Garlan Fillers, MD, MD Primary Cardiologist   TW Reason for Consultation   Chest pain  [name] is a 78 y.o. male with a history of CAD. About 2 weeks ago, he began having exertional angina that goes all the way across his chest and would reach 9/10. Initially he would only have symptoms with significant exertion. His symptoms would resolve with rest or one sublingual nitroglycerin. However, his symptoms progressed and over the past week he has gotten angina chest with getting out of bed or minimal activity. He has also noticed a significant increase in his dyspnea on exertion. He denies melena or any obvious bleeding.  Today at about 5 AM he was awakened by substernal chest pain like his previous episodes. It improved with nitroglycerin and aspirin but did not immediately resolve. This was the first episode that had started at rest. It was also a 9/10. It was associated with shortness of breath but no nausea or diaphoresis. When his symptoms did not resolve, EMS was called. They gave him 2 sublingual nitroglycerin and baby aspirin x4. Currently in the emergency room, on O2 and IV nitroglycerin, he is pain-free but becomes short of breath with conversation and minimal activity. Dr. Lynelle Doctor did a stool guaiac test which was negative but she felt the sample was minimal.   Diagnosis  . Coronary atherosclerosis of artery bypass graft  . Cerebrovascular disease, unspecified - right ICA < 60%, left ICA < 40%, 01/2012  . Unspecified essential hypertension  . Other and unspecified hyperlipidemia  . Type II or unspecified type diabetes mellitus without mention of complication, not stated as uncontrolled     Past Surgical History  Procedure Date  . Coronary artery bypass graft 1995  . Rotator cuff repair   . Carpal tunnel release   .  Incisional hernia repair   . Kidney surgery    Herpes Zoster 2012  . Back surgery     Allergies  Allergen Reactions  . Azithromycin Itching  . Macrodantin Itching  . Minocin Itching  . Minocycline Hcl Itching  . Nitrofurantoin Itching  . Zithromax (Azithromycin Dihydrate) Itching    I have reviewed the patient's current medications. Medication Sig  . aspirin 325 MG tablet Take 325 mg by mouth daily.  Marland Kitchen losartan (COZAAR) 100 MG tablet Take 1 tablet by mouth Daily.  . meloxicam (MOBIC) 15 MG tablet Take 1 tablet by mouth Daily.  . metFORMIN (GLUCOPHAGE) 500 MG tablet Take 500 mg by mouth 2 (two) times daily with a meal.  . metoprolol succinate (TOPROL-XL) 25 MG 24 hr tablet Take 25 mg by mouth daily.   . nitroGLYCERIN (NITROSTAT) 0.4 MG SL tablet Place 0.4 mg under the tongue every 5 (five) minutes as needed.  . pravastatin (PRAVACHOL) 40 MG tablet Take 40 mg by mouth daily.    History   Social History  . Marital Status: Married    Spouse Name: N/A    Number of Children: N/A  . Years of Education: N/A   Occupational History  . retired [employer]   Social History Main Topics  . Smoking status: Former Games developer  . Smokeless tobacco: Never Used   Comment: quit in 1986  . Alcohol Use: No  . Drug Use: No  . Sexually Active: Not on file   Social History Narrative  .  [place]. He remains active although recently his activity level has been decreased because of his symptoms.      Family History  Problem Relation Age of Onset  . Coronary artery disease    . Alcohol abuse       ROS: He denies reflux symptoms or melena. He has not had any recent illnesses, fevers or chills. He is not aware of any source of blood loss. He has significant dyspnea on exertion. He has occasional musculoskeletal aches and pains. He has a recent left ankle injury but is ambulatory. Full 14 point review of systems complete and found to be negative unless listed   above  Physical Exam: Blood pressure 123/61, pulse 59, temperature 97.9 F (36.6 C), temperature source Oral, resp. rate 21, height 6\' 1"  (1.854 m), weight 185 lb (83.915 kg), SpO2 100.00%.   General: Well developed, well nourished, elderly male who is short of breath Head: Eyes PERRLA, No xanthomas.   Normocephalic and atraumatic, oropharynx without edema or exudate. Dentition poor Lungs: Clear bilaterally to auscultation  Heart: HRRR S1 S2, no rub/gallop, 2/6 murmur. pulses are 2+ & equal all 4 extrem.   Neck: Bilateral carotid bruits. No lymphadenopathy.  JVD not elevated. Abdomen: Bowel sounds present, abdomen soft and non-tender without masses or hernias noted. Msk:  No spine or cva tenderness. He is weak, no joint deformities or effusions. Extremities: No clubbing or cyanosis. Trace edema in his left foot only.  Neuro: Alert and oriented X 3. No focal deficits noted. Psych:  Good affect, responds appropriately Skin: No rashes noted. He has a recent ankle injury on the left and there is some ecchymosis.  Labs:  POC Troponin: 0.23   Lab Results  Component Value Date   WBC 5.5 02/02/2012   HGB 8.5* 02/02/2012   HCT 26.8* 02/02/2012   MCV 93.7 02/02/2012   PLT PENDING 02/02/2012    Basename 02/02/12 0808  INR 1.12    Radiology:  Dg Chest Portable 1 View 02/02/2012  *RADIOLOGY REPORT*  Clinical Data: Shortness of breath, chest pain  PORTABLE CHEST - 1 VIEW  Comparison: Chest x-ray of 06/19/2004  Findings: Mild basilar atelectasis is present.  Moderate cardiomegaly is stable.  Median sternotomy sutures are noted from prior CABG. No bony abnormality is seen.  IMPRESSION: Stable cardiomegaly.  No active process.  Minimal basilar atelectasis.  Original Report Authenticated By: Juline Patch, M.D.   EKG:  Sinus rhythm with diffuse inferolateral ST depression, no ST elevation  ASSESSMENT AND PLAN:   The patient was seen today by Dr. Excell Seltzer, the patient evaluated and the data reviewed.    1. crescendo anginal pain, possible non-ST segment elevation MI: It is unclear if he has an acute vessel closure or if his anginal symptoms are secondary to his anemia. He was initially placed on heparin but this was discontinued because of his anemia. We will continue the IV nitroglycerin and beta blocker. We will decrease his aspirin to 81 mg daily. We will continue to cycle enzymes and will check an echocardiogram. A decision on further evaluation and treatment will be made once more data are available.  2. Anemia: His last previous labs are a year old but his hemoglobin at that time was 12.5. The patient is not aware of any source of blood loss. His initial guaiac was negative but the sample was minimal. We will continue to guaiac stools and an iron profile has been ordered. Consider GI  evaluation. If his subsequent cardiac enzymes are significantly elevated, transfusion may be needed.  3. diabetes: We will hold his metformin and continue sliding scale insulin  4. Volume overload - his BNP is mildly elevated and he has SOB with any exertion. He has had Lasix 60 mg x1 and we will follow volume status, oxygen sats, symptoms and I/O.  The patient is otherwise stable and will be continued on his home medications with the exception of Mobic.    Signed: Theodore Demark 02/02/2012, 8:50 AM Co-Sign MD  Patient seen, examined. Available data reviewed. Agree with findings, assessment, and plan as outlined by Theodore Demark, PA-C. I have independently interviewed and examined the patient and discussed the pertinent medical issues and disposition with the patient and his wife. The patient clearly has symptoms of crescendo angina, now with rest pain this morning. Mildly elevated troponin and lateral ST depression on EKG are all consistent with diagnosis of NSTEMI-ACS. Complicating matters is the fact that he has new anemia and thrombocytopenia. Stool heme test in the ER is negative but there was really no  stool in his rectal vault per Dr Lynelle Doctor. For now will hold on heparin because of his anemia, and will continue IV NTG as he is chest pain-free. Will likely need pRBC transfusion but he should probably have anemia workup first. I contacted Dr Eloise Harman and have asked for an Internal Medicine consult to evaluate the patient's cytopenias. The patient will clearly need cardiac cath and possible PCI pending the cath findings. Further plans as outlined in the note of Ms Barrett.  [name], M.D. 02/02/2012 1:00 PM

## 2012-02-02 NOTE — Progress Notes (Signed)
*  PRELIMINARY RESULTS* Echocardiogram 2D Echocardiogram has been performed.  Katheren Puller 02/02/2012, 11:38 AM

## 2012-02-02 NOTE — ED Notes (Signed)
Spoke with [facility] and they stated that they will by in the next 2 hours

## 2012-02-02 NOTE — ED Notes (Signed)
Notified consulting cardiologist of troponin of 0.39. States that she continues to not want the heparin to be given.

## 2012-02-02 NOTE — ED Notes (Signed)
Family at bedside. 

## 2012-02-02 NOTE — ED Notes (Signed)
Patient is resting comfortably.unable to get temp. 

## 2012-02-02 NOTE — Progress Notes (Signed)
ANTICOAGULATION CONSULT NOTE - Initial Consult  Pharmacy Consult for UFH Indication: NSTEMI  Allergies  Allergen Reactions  . Azithromycin Itching  . Macrodantin Itching  . Minocin Itching  . Minocycline Hcl Itching  . Nitrofurantoin Itching  . Zithromax (Azithromycin Dihydrate) Itching    Patient Measurements: Height: 6\' 1"  (185.4 cm) Weight: 185 lb (83.915 kg) IBW/kg (Calculated) : 79.9   Vital Signs: Temp: 97.9 F (36.6 C) (02/27 0759) Temp src: Oral (02/27 0759) BP: 123/61 mmHg (02/27 0830) Pulse Rate: 59  (02/27 0830)  Labs:  Basename 02/02/12 0808  HGB 8.5*  HCT 26.8*  PLT PENDING  APTT 29  LABPROT 14.6  INR 1.12  HEPARINUNFRC --  CREATININE --  CKTOTAL --  CKMB --  TROPONINI --   Estimated Creatinine Clearance: 62.5 ml/min (by C-G formula based on Cr of 1.03).  Medical History: Past Medical History  Diagnosis Date  . Coronary atherosclerosis of artery bypass graft   . Cerebrovascular disease, unspecified   . Unspecified essential hypertension   . Other and unspecified hyperlipidemia   . Type II or unspecified type diabetes mellitus without mention of complication, not stated as uncontrolled     Medications:   (Not in a hospital admission)  Assessment: 78 y/o male patient admitted with significant cardiac history. C/o shortness of breath and chest pain requiring anticoagulation for NSTEMI. CE found to be positive with EKG some ST depression. Baseline anemia with thrombocytopenia.  Goal of Therapy:  Heparin level 0.3-0.7 units/ml   Plan:  Heparin 4000 unit IV bolus followed by infusion at 1000 units/hr. Check 8 hour heparin level with daily cbc and heparin level.  Verlene Mayer, PharmD, BCPS Pager [phone] 02/02/2012,8:49 AM

## 2012-02-03 ENCOUNTER — Other Ambulatory Visit: Payer: Self-pay

## 2012-02-03 ENCOUNTER — Ambulatory Visit: Payer: Medicare Other | Admitting: Physician Assistant

## 2012-02-03 ENCOUNTER — Encounter (HOSPITAL_COMMUNITY)
Admission: EM | Disposition: A | Discharge status: Home / Self Care | Payer: Self-pay | Source: Home / Self Care | Attending: Cardiovascular Disease

## 2012-02-03 DIAGNOSIS — I214 Non-ST elevation (NSTEMI) myocardial infarction: Secondary | ICD-10-CM

## 2012-02-03 DIAGNOSIS — I25810 Atherosclerosis of coronary artery bypass graft(s) without angina pectoris: Secondary | ICD-10-CM

## 2012-02-03 HISTORY — PX: LEFT AND RIGHT HEART CATHETERIZATION WITH CORONARY/GRAFT ANGIOGRAM: SHX5448

## 2012-02-03 HISTORY — PX: PERCUTANEOUS STENT INTERVENTION: SHX5500

## 2012-02-03 LAB — GLUCOSE, CAPILLARY
Glucose-Capillary: 129 mg/dL — ABNORMAL HIGH (ref 70–99)
Glucose-Capillary: 151 mg/dL — ABNORMAL HIGH (ref 70–99)

## 2012-02-03 LAB — CARDIAC PANEL(CRET KIN+CKTOT+MB+TROPI): Relative Index: INVALID (ref 0.0–2.5)

## 2012-02-03 LAB — CBC
HCT: 26.3 % — ABNORMAL LOW (ref 39.0–52.0)
Hemoglobin: 8.4 g/dL — ABNORMAL LOW (ref 13.0–17.0)
MCH: 30 pg (ref 26.0–34.0)
MCV: 93.9 fL (ref 78.0–100.0)
RBC: 2.8 MIL/uL — ABNORMAL LOW (ref 4.22–5.81)

## 2012-02-03 LAB — DIFFERENTIAL
Eosinophils Absolute: 0.1 10*3/uL (ref 0.0–0.7)
Eosinophils Relative: 2 % (ref 0–5)
Lymphs Abs: 1.2 10*3/uL (ref 0.7–4.0)
Monocytes Absolute: 0.8 10*3/uL (ref 0.1–1.0)
Monocytes Relative: 12 % (ref 3–12)

## 2012-02-03 LAB — IRON AND TIBC
Saturation Ratios: 20 % (ref 20–55)
UIBC: 219 ug/dL (ref 125–400)

## 2012-02-03 LAB — RETICULOCYTES: RBC.: 2.83 MIL/uL — ABNORMAL LOW (ref 4.22–5.81)

## 2012-02-03 LAB — FOLATE: Folate: >20 ng/mL

## 2012-02-03 SURGERY — LEFT AND RIGHT HEART CATHETERIZATION WITH CORONARY/GRAFT ANGIOGRAM
Anesthesia: LOCAL

## 2012-02-03 MED ORDER — MORPHINE SULFATE 2 MG/ML IJ SOLN: 2.0000 mg | Freq: Once | INTRAMUSCULAR | Status: DC

## 2012-02-03 MED ORDER — FENTANYL CITRATE 0.05 MG/ML IJ SOLN
INTRAMUSCULAR | Status: AC
  Filled 2012-02-03: qty 2

## 2012-02-03 MED ORDER — SODIUM CHLORIDE 0.9 % IV SOLN
1.0000 mL/kg/h | INTRAVENOUS | Status: AC
  Administered 2012-02-03: 1 mL/kg/h via INTRAVENOUS

## 2012-02-03 MED ORDER — ASPIRIN 81 MG PO CHEW: 324.0000 mg | CHEWABLE_TABLET | ORAL | Status: DC

## 2012-02-03 MED ORDER — SODIUM CHLORIDE 0.9 % IV SOLN
INTRAVENOUS | Status: DC
  Administered 2012-02-03: 09:00:00 via INTRAVENOUS

## 2012-02-03 MED ORDER — HEART ATTACK BOUNCING BOOK
Freq: Once | Status: DC
  Filled 2012-02-03: qty 1

## 2012-02-03 MED ORDER — NITROGLYCERIN 0.2 MG/ML ON CALL CATH LAB
INTRAVENOUS | Status: AC
  Filled 2012-02-03: qty 1

## 2012-02-03 MED ORDER — ACETAMINOPHEN 325 MG PO TABS: 650.0000 mg | ORAL_TABLET | ORAL | Status: DC | PRN

## 2012-02-03 MED ORDER — HEPARIN (PORCINE) IN NACL 2-0.9 UNIT/ML-% IJ SOLN
INTRAMUSCULAR | Status: AC
  Filled 2012-02-03: qty 2000

## 2012-02-03 MED ORDER — ASPIRIN 81 MG PO CHEW
81.0000 mg | CHEWABLE_TABLET | Freq: Every day | ORAL | Status: DC
  Administered 2012-02-04: 81 mg via ORAL
  Filled 2012-02-03: qty 1

## 2012-02-03 MED ORDER — MORPHINE SULFATE 2 MG/ML IJ SOLN
INTRAMUSCULAR | Status: AC
  Administered 2012-02-03: 02:00:00 2 mg via INTRAVENOUS
  Filled 2012-02-03: qty 1

## 2012-02-03 MED ORDER — ONDANSETRON HCL 4 MG/2ML IJ SOLN: 4.0000 mg | Freq: Four times a day (QID) | INTRAMUSCULAR | Status: DC | PRN

## 2012-02-03 MED ORDER — SODIUM CHLORIDE 0.9 % IJ SOLN: 3.0000 mL | INTRAMUSCULAR | Status: DC | PRN

## 2012-02-03 MED ORDER — ASPIRIN 81 MG PO CHEW
324.0000 mg | CHEWABLE_TABLET | ORAL | Status: AC
  Administered 2012-02-03: 09:00:00 324 mg via ORAL
  Filled 2012-02-03: qty 4

## 2012-02-03 MED ORDER — SODIUM CHLORIDE 0.9 % IJ SOLN: 3.0000 mL | Freq: Two times a day (BID) | INTRAMUSCULAR | Status: DC

## 2012-02-03 MED ORDER — SODIUM CHLORIDE 0.9 % IJ SOLN
3.0000 mL | Freq: Two times a day (BID) | INTRAMUSCULAR | Status: DC
  Administered 2012-02-04: 3 mL via INTRAVENOUS

## 2012-02-03 MED ORDER — HYDROCORTISONE 1 % EX CREA
TOPICAL_CREAM | Freq: Two times a day (BID) | CUTANEOUS | Status: DC | PRN
  Administered 2012-02-03: 1 via TOPICAL
  Filled 2012-02-03: qty 28

## 2012-02-03 MED ORDER — MIDAZOLAM HCL 2 MG/2ML IJ SOLN
INTRAMUSCULAR | Status: AC
  Filled 2012-02-03: qty 2

## 2012-02-03 MED ORDER — CLOPIDOGREL BISULFATE 75 MG PO TABS
75.0000 mg | ORAL_TABLET | Freq: Every day | ORAL | Status: DC
  Administered 2012-02-04: 75 mg via ORAL
  Filled 2012-02-03: qty 1

## 2012-02-03 MED ORDER — LIDOCAINE HCL (PF) 1 % IJ SOLN
INTRAMUSCULAR | Status: AC
  Filled 2012-02-03: qty 30

## 2012-02-03 MED ORDER — POLYSACCHARIDE IRON COMPLEX 150 MG PO CAPS
150.0000 mg | ORAL_CAPSULE | Freq: Every day | ORAL | Status: DC
  Administered 2012-02-04: 150 mg via ORAL
  Filled 2012-02-03: qty 1

## 2012-02-03 MED ORDER — BIVALIRUDIN 250 MG IV SOLR
INTRAVENOUS | Status: AC
  Filled 2012-02-03: qty 250

## 2012-02-03 MED ORDER — SODIUM CHLORIDE 0.9 % IV SOLN: 250.0000 mL | INTRAVENOUS | Status: DC

## 2012-02-03 MED ORDER — DIAZEPAM 2 MG PO TABS
2.0000 mg | ORAL_TABLET | ORAL | Status: AC
  Administered 2012-02-03: 2 mg via ORAL

## 2012-02-03 MED ORDER — SODIUM CHLORIDE 0.9 % IV SOLN: 250.0000 mL | INTRAVENOUS | Status: DC | PRN

## 2012-02-03 NOTE — Consult Note (Signed)
Reason for Consult:  Anemia Referring Physician: Keiron Iodice is an 78 y.o. male.  HPI: the patient is an 78 year old man who is well known to me.  He was admitted by his cardiology group today and had cardiac catheterization and angioplasty done.  Admission laboratory studies were notable for moderate anemia, as well as positive troponin I level.  The patient denies symptoms of nausea, vomiting, abdominal pain, melena, or gross rectal bleeding.  He does use Mobic 15 mg daily as well as the agents to help control his postherpetic neuralgia pain.  He has no history of peptic ulcer disease.  At his annual physical examination in late December 2012 he had labs significant for hemoglobin 9.4 with hematocrit 28, white blood cell count 6.3, platelets 137 with mean corpuscular volume of 94.  Subsequent laboratory tests at that time included a serum protein electrophoresis, serum folate, serum ferritin, serum iron, and TIBC that were within normal limits with a transferrin saturation of 23%.  He also had a stool hemosure test at that time that was negative.  In October 2006 he had a colonoscopy done by Dr. Vilinda Boehringer that showed 2 adenomas polyps.  To my knowledge he has never had an endoscopy done. At his last office visit with [place] last week he was most troubled by his right facial pain from postherpetic neuralgia, and medications were adjusted accordingly to help improve pain control.  Earlier today he described worsening dyspnea on exertion and he had mild dyspnea with conversation despite nasal cannula oxygen.  He has an occasional dry cough, and most recently has had persistent substernal chest pain.  He continues to deny problems with nausea, abdominal pain, or change in bowel movements.  Past Medical History  Diagnosis Date  . Coronary atherosclerosis of artery bypass graft   . Cerebrovascular disease, unspecified   . Unspecified essential hypertension   . Other and unspecified  hyperlipidemia   . Type II or unspecified type diabetes mellitus without mention of complication, not stated as uncontrolled   . Osteoarthritis (arthritis due to wear and tear of joints)   . Asthmatic bronchitis   . BPH (benign prostatic hypertrophy)   . Herpes zoster   . Angina   . Arthritis     Past Surgical History  Procedure Date  . Coronary artery bypass graft 1995  . Rotator cuff repair   . Carpal tunnel release   . Incisional hernia repair   . Kidney surgery   . Back surgery     Family History  Problem Relation Age of Onset  . Coronary artery disease    . Alcohol abuse      Social History:  reports that he has quit smoking. He has never used smokeless tobacco. He reports that he does not drink alcohol or use illicit drugs.  Allergies:  Allergies  Allergen Reactions  . Azithromycin Itching  . Macrodantin Itching  . Minocin Itching  . Minocycline Hcl Itching  . Nitrofurantoin Itching  . Zithromax (Azithromycin Dihydrate) Itching    Medications: I have reviewed the patient's current medications.  Results for orders placed during the hospital encounter of 02/02/12 (from the past 48 hour(s))  CBC     Status: Abnormal   Collection Time   02/02/12  8:08 AM      Component Value Range Comment   WBC 5.5  4.0 - 10.5 (K/uL)    RBC 2.86 (*) 4.22 - 5.81 (MIL/uL)    Hemoglobin 8.5 (*) 13.0 -  17.0 (g/dL)    HCT 62.1 (*) 30.8 - 52.0 (%)    MCV 93.7  78.0 - 100.0 (fL)    MCH 29.7  26.0 - 34.0 (pg)    MCHC 31.7  30.0 - 36.0 (g/dL)    RDW 65.7 (*) 84.6 - 15.5 (%)    Platelets 88 (*) 150 - 400 (K/uL) PLATELET COUNT CONFIRMED BY SMEAR  DIFFERENTIAL     Status: Normal   Collection Time   02/02/12  8:08 AM      Component Value Range Comment   Neutrophils Relative 74  43 - 77 (%)    Lymphocytes Relative 18  12 - 46 (%)    Monocytes Relative 6  3 - 12 (%)    Eosinophils Relative 2  0 - 5 (%)    Basophils Relative 0  0 - 1 (%)    Neutro Abs 4.1  1.7 - 7.7 (K/uL)    Lymphs  Abs 1.0  0.7 - 4.0 (K/uL)    Monocytes Absolute 0.3  0.1 - 1.0 (K/uL)    Eosinophils Absolute 0.1  0.0 - 0.7 (K/uL)    Basophils Absolute 0.0  0.0 - 0.1 (K/uL)    RBC Morphology POLYCHROMASIA PRESENT      WBC Morphology ATYPICAL LYMPHOCYTES     COMPREHENSIVE METABOLIC PANEL     Status: Abnormal   Collection Time   02/02/12  8:08 AM      Component Value Range Comment   Sodium 141  135 - 145 (mEq/L)    Potassium 4.3  3.5 - 5.1 (mEq/L)    Chloride 108  96 - 112 (mEq/L)    CO2 21  19 - 32 (mEq/L)    Glucose, Bld 181 (*) 70 - 99 (mg/dL)    BUN 25 (*) 6 - 23 (mg/dL)    Creatinine, Ser 9.62  0.50 - 1.35 (mg/dL)    Calcium 9.5  8.4 - 10.5 (mg/dL)    Total Protein 6.1  6.0 - 8.3 (g/dL)    Albumin 3.4 (*) 3.5 - 5.2 (g/dL)    AST 20  0 - 37 (U/L)    ALT 10  0 - 53 (U/L)    Alkaline Phosphatase 103  39 - 117 (U/L)    Total Bilirubin 0.5  0.3 - 1.2 (mg/dL)    GFR calc non Af Amer 59 (*) >90 (mL/min)    GFR calc Af Amer 69 (*) >90 (mL/min)   APTT     Status: Normal   Collection Time   02/02/12  8:08 AM      Component Value Range Comment   aPTT 29  24 - 37 (seconds)   PROTIME-INR     Status: Normal   Collection Time   02/02/12  8:08 AM      Component Value Range Comment   Prothrombin Time 14.6  11.6 - 15.2 (seconds)    INR 1.12  0.00 - 1.49    PRO B NATRIURETIC PEPTIDE     Status: Abnormal   Collection Time   02/02/12  8:09 AM      Component Value Range Comment   Pro B Natriuretic peptide (BNP) 5711.0 (*) 0 - 450 (pg/mL)   POCT I-STAT TROPONIN I     Status: Abnormal   Collection Time   02/02/12  8:37 AM      Component Value Range Comment   Troponin i, poc 0.23 (*) 0.00 - 0.08 (ng/mL)    Comment NOTIFIED PHYSICIAN  Comment 3            OCCULT BLOOD, POC DEVICE     Status: Normal   Collection Time   02/02/12  8:54 AM      Component Value Range Comment   Fecal Occult Bld NEGATIVE     URINALYSIS, ROUTINE W REFLEX MICROSCOPIC     Status: Abnormal   Collection Time   02/02/12  8:59 AM       Component Value Range Comment   Color, Urine YELLOW  YELLOW     APPearance CLEAR  CLEAR     Specific Gravity, Urine 1.014  1.005 - 1.030     pH 5.5  5.0 - 8.0     Glucose, UA NEGATIVE  NEGATIVE (mg/dL)    Hgb urine dipstick NEGATIVE  NEGATIVE     Bilirubin Urine NEGATIVE  NEGATIVE     Ketones, ur NEGATIVE  NEGATIVE (mg/dL)    Protein, ur 30 (*) NEGATIVE (mg/dL)    Urobilinogen, UA 0.2  0.0 - 1.0 (mg/dL)    Nitrite NEGATIVE  NEGATIVE     Leukocytes, UA NEGATIVE  NEGATIVE    URINE MICROSCOPIC-ADD ON     Status: Normal   Collection Time   02/02/12  8:59 AM      Component Value Range Comment   Squamous Epithelial / LPF RARE  RARE     WBC, UA 0-2  <3 (WBC/hpf)    RBC / HPF 0-2  <3 (RBC/hpf)   SAMPLE TO BLOOD BANK     Status: Normal   Collection Time   02/02/12  9:00 AM      Component Value Range Comment   Blood Bank Specimen SAMPLE AVAILABLE FOR TESTING      Sample Expiration 02/03/2012     CARDIAC PANEL(CRET KIN+CKTOT+MB+TROPI)     Status: Abnormal   Collection Time   02/02/12  9:09 AM      Component Value Range Comment   Total CK 67  7 - 232 (U/L)    CK, MB 3.5  0.3 - 4.0 (ng/mL)    Troponin I 0.39 (*) <0.30 (ng/mL)    Relative Index RELATIVE INDEX IS INVALID  0.0 - 2.5    TSH     Status: Normal   Collection Time   02/02/12  9:10 AM      Component Value Range Comment   TSH 1.290  0.350 - 4.500 (uIU/mL)   HEPATIC FUNCTION PANEL     Status: Abnormal   Collection Time   02/02/12  9:10 AM      Component Value Range Comment   Total Protein 6.1  6.0 - 8.3 (g/dL)    Albumin 3.4 (*) 3.5 - 5.2 (g/dL)    AST 20  0 - 37 (U/L)    ALT 10  0 - 53 (U/L)    Alkaline Phosphatase 104  39 - 117 (U/L)    Total Bilirubin 0.5  0.3 - 1.2 (mg/dL)    Bilirubin, Direct 0.1  0.0 - 0.3 (mg/dL)    Indirect Bilirubin 0.4  0.3 - 0.9 (mg/dL)   LIPID PANEL     Status: Abnormal   Collection Time   02/02/12  9:10 AM      Component Value Range Comment   Cholesterol 111  0 - 200 (mg/dL)     Triglycerides 76  <150 (mg/dL)    HDL 24 (*) >14 (mg/dL)    Total CHOL/HDL Ratio 4.6      VLDL 15  0 -  40 (mg/dL)    LDL Cholesterol 72  0 - 99 (mg/dL)   IRON AND TIBC     Status: Abnormal   Collection Time   02/02/12  9:10 AM      Component Value Range Comment   Iron 53  42 - 135 (ug/dL)    TIBC 161  096 - 045 (ug/dL)    Saturation Ratios 18 (*) 20 - 55 (%)    UIBC 238  125 - 400 (ug/dL)   FERRITIN     Status: Normal   Collection Time   02/02/12  9:10 AM      Component Value Range Comment   Ferritin 69  22 - 322 (ng/mL)   CARDIAC PANEL(CRET KIN+CKTOT+MB+TROPI)     Status: Abnormal   Collection Time   02/02/12  4:55 PM      Component Value Range Comment   Total CK 64  7 - 232 (U/L)    CK, MB 3.8  0.3 - 4.0 (ng/mL)    Troponin I 0.73 (*) <0.30 (ng/mL)    Relative Index RELATIVE INDEX IS INVALID  0.0 - 2.5    GLUCOSE, CAPILLARY     Status: Abnormal   Collection Time   02/02/12  5:26 PM      Component Value Range Comment   Glucose-Capillary 142 (*) 70 - 99 (mg/dL)    Comment 1 Documented in Chart      Comment 2 Notify RN     GLUCOSE, CAPILLARY     Status: Abnormal   Collection Time   02/02/12  9:34 PM      Component Value Range Comment   Glucose-Capillary 116 (*) 70 - 99 (mg/dL)   CARDIAC PANEL(CRET KIN+CKTOT+MB+TROPI)     Status: Abnormal   Collection Time   02/03/12 12:21 AM      Component Value Range Comment   Total CK 59  7 - 232 (U/L)    CK, MB 3.3  0.3 - 4.0 (ng/mL)    Troponin I 0.54 (*) <0.30 (ng/mL)    Relative Index RELATIVE INDEX IS INVALID  0.0 - 2.5    VITAMIN B12     Status: Normal   Collection Time   02/03/12 12:29 AM      Component Value Range Comment   Vitamin B-12 315  211 - 911 (pg/mL)   FOLATE     Status: Normal   Collection Time   02/03/12 12:29 AM      Component Value Range Comment   Folate >20.0     IRON AND TIBC     Status: Normal   Collection Time   02/03/12 12:29 AM      Component Value Range Comment   Iron 56  42 - 135 (ug/dL)    TIBC 409   811 - 914 (ug/dL)    Saturation Ratios 20  20 - 55 (%)    UIBC 219  125 - 400 (ug/dL)   FERRITIN     Status: Normal   Collection Time   02/03/12 12:29 AM      Component Value Range Comment   Ferritin 67  22 - 322 (ng/mL)   RETICULOCYTES     Status: Abnormal   Collection Time   02/03/12 12:29 AM      Component Value Range Comment   Retic Ct Pct 1.7  0.4 - 3.1 (%)    RBC. 2.83 (*) 4.22 - 5.81 (MIL/uL)    Retic Count, Manual 48.1  19.0 - 186.0 (K/uL)  CBC     Status: Abnormal   Collection Time   02/03/12 12:29 AM      Component Value Range Comment   WBC 6.3  4.0 - 10.5 (K/uL)    RBC 2.80 (*) 4.22 - 5.81 (MIL/uL)    Hemoglobin 8.4 (*) 13.0 - 17.0 (g/dL)    HCT 16.1 (*) 09.6 - 52.0 (%)    MCV 93.9  78.0 - 100.0 (fL)    MCH 30.0  26.0 - 34.0 (pg)    MCHC 31.9  30.0 - 36.0 (g/dL)    RDW 04.5 (*) 40.9 - 15.5 (%)    Platelets 101 (*) 150 - 400 (K/uL) CONSISTENT WITH PREVIOUS RESULT  DIFFERENTIAL     Status: Normal   Collection Time   02/03/12 12:29 AM      Component Value Range Comment   Neutrophils Relative 67  43 - 77 (%)    Neutro Abs 4.2  1.7 - 7.7 (K/uL)    Lymphocytes Relative 19  12 - 46 (%)    Lymphs Abs 1.2  0.7 - 4.0 (K/uL)    Monocytes Relative 12  3 - 12 (%)    Monocytes Absolute 0.8  0.1 - 1.0 (K/uL)    Eosinophils Relative 2  0 - 5 (%)    Eosinophils Absolute 0.1  0.0 - 0.7 (K/uL)    Basophils Relative 0  0 - 1 (%)    Basophils Absolute 0.0  0.0 - 0.1 (K/uL)   GLUCOSE, CAPILLARY     Status: Abnormal   Collection Time   02/03/12  8:26 AM      Component Value Range Comment   Glucose-Capillary 129 (*) 70 - 99 (mg/dL)    Comment 1 Documented in Chart      Comment 2 Notify RN     POCT ACTIVATED CLOTTING TIME     Status: Normal   Collection Time   02/03/12 11:07 AM      Component Value Range Comment   Activated Clotting Time 309     GLUCOSE, CAPILLARY     Status: Abnormal   Collection Time   02/03/12  5:28 PM      Component Value Range Comment   Glucose-Capillary  151 (*) 70 - 99 (mg/dL)    Comment 1 Notify RN     GLUCOSE, CAPILLARY     Status: Abnormal   Collection Time   02/03/12  9:40 PM      Component Value Range Comment   Glucose-Capillary 142 (*) 70 - 99 (mg/dL)    Comment 1 Documented in Chart      Comment 2 Notify RN       Dg Chest Portable 1 View  02/02/2012  *RADIOLOGY REPORT*  Clinical Data: Shortness of breath, chest pain  PORTABLE CHEST - 1 VIEW  Comparison: Chest x-ray of 06/19/2004  Findings: Mild basilar atelectasis is present.  Moderate cardiomegaly is stable.  Median sternotomy sutures are noted from prior CABG. No bony abnormality is seen.  IMPRESSION: Stable cardiomegaly.  No active process.  Minimal basilar atelectasis.  Original Report Authenticated By: Juline Patch, M.D.    ROS:see history of present illness  Blood pressure 117/45, pulse 73, temperature 98.5 F (36.9 C), temperature source Oral, resp. rate 24, height 6\' 1"  (1.854 m), weight 87.2 kg (192 lb 3.9 oz), SpO2 96.00%.  PHYSICAL EXAM: In general, he is a well-nourished well-developed white male in who had mild dyspnea with conversation, but no chest pain.  HEENT exam was  within normal limits, neck supple without jugular venous distention or carotid bruit, chest had hyperexpanded lungs and bilateral minimal scattered wheezing, but no use of secondary muscles of respiration, heart had a regular rate and rhythm, abdomen had normal bowel sounds and no hepatosplenomegaly or tenderness, extremities were without cyanosis, clubbing, or edema.  Neurologic exam was significant for mildly decreased hearing bilaterally, but was otherwise nonfocal.  Assessment/Plan: #1 Anemia: moderately severe and appears to be multifactorial in origin.  He has borderline iron deficiency on current labs, and also has a somewhat low reticulocyte count.  We can start him on supplemental iron by mouth, check stool Hemoccults x3, and request a hematologist evaluation as an outpatient .we will recheck  his CBC in the morning. #2 COPD and wheezing: Earlier today was unclear as to whether or not his wheezing was primarily cardiac in origin versus secondary to primary lung disease.  We will check a BNP test in the morning, which if low would suggest a primary lung problem. #3 Diabetes Mellitus, Type II: Stable on sliding scale insulin.  Lotus Gover G 02/03/2012, 11:37 PM

## 2012-02-03 NOTE — Progress Notes (Signed)
Dr. Charm Barges in and assessed pt. With orders to give IV morphine 2mg . IV x1 now, pt. Still have some tightness on his chest and also complaining of pain oh his rt. frontal head due to hx. of shingles.  Morphine given IV 2mg . at 0141. Pt. stated pain is better. Will cont. to monitor pt.

## 2012-02-03 NOTE — Progress Notes (Signed)
UR Completed. Simmons, Stanislav Gervase F [phone]  

## 2012-02-03 NOTE — CV Procedure (Signed)
Cardiac Catheterization Procedure Note  Name: [name] MRN: [id number] DOB: [birth year]  Procedure: Left Heart Cath, Selective Coronary Angiography, LV angiography,  saphenous vein graft angiography, LIMA angiography, PTCA/Stent of the saphenous vein graft to PDA  Indication: 78 year old gentleman presented with unstable angina. He ruled in for non-ST elevation MI. He has had recurrent chest pain at rest in the hospital and was brought for cardiac catheterization.   Diagnostic Procedure Details: The right groin was prepped, draped, and anesthetized with 1% lidocaine. Using the modified Seldinger technique, a 5 French sheath was introduced into the right femoral artery. Standard Judkins catheters were used for selective coronary angiography and left ventriculography. Saphenous vein graft and LIMA angiography were performed.Catheter exchanges were performed over a wire.  The diagnostic procedure was well-tolerated without immediate complications.  PROCEDURAL FINDINGS Hemodynamics: AO 121/59 LV 135/10  Coronary angiography: Coronary dominance: right  Left mainstem: Heavy calcification with 90-95% distal left main stenosis  Left anterior descending (LAD): Heavy calcification with total proximal occlusion of the LAD.  Left circumflex (LCx): Heavy calcification with total proximal occlusion  Right coronary artery (RCA): The RCA is heavily calcified. There is diffuse mid vessel disease noted. The RCA is totally occluded at the junction of the mid and distal vessel just after the acute marginal branch  Saphenous vein graft to PDA: There is severe 95% stenosis in the proximal body of the saphenous vein graft just below the origin of the vessel. The remaining portions of the saphenous vein graft has diffuse nonobstructive disease noted. The distal anastomotic site appears intact to the PDA and is retrograde fills the posterolateral branch  Saphenous vein graft sequence to the left  circumflex: This vein graft is smooth throughout its course. It appears to be sequence to multiple obtuse marginal branches. The graft is widely patent and the distal anastomotic sites are all widely patent  LIMA to LAD [name] the LIMA is widely patent. The LAD beyond the LIMA insertion site is patent throughout with diffuse small vessel disease. The diagonal branch retrograde fills with brisk flow from the LIMA graft.  Left ventriculography: Left ventricular systolic function appears normal, however the left ventricle is poorly visualized. There may be a wall motion abnormality involving the inferior wall but I think the overall function is intact. The mitral annulus is heavily calcified. The aortic valve is calcified.  PCI Procedure Note:  Following the diagnostic procedure, the decision was made to proceed with PCI. The sheath was upsized to a 6 [place]. Weight-based bivalirudin was given for anticoagulation. The patient was given 600 mg of Plavix on the cardiac cath lab table. Once a therapeutic ACT was achieved, a 6 [place] RCB guide catheter was inserted.  A filter wire coronary guidewire was used to cross the lesion.  The lesion was predilated with a 2.5 x 15 mm balloon.  The lesion was then stented with a 3.0 x 16 mm Veriflex bare-metal stent.  The stent was postdilated with a 3.25 mm noncompliant balloon to 18 atmospheres on 2 consecutive inflations.  Following PCI, there was 0% residual stenosis and TIMI-3 flow. Final angiography confirmed an excellent result. The patient tolerated the PCI procedure well. There were no immediate procedural complications.  The patient was transferred to the post catheterization recovery area for further monitoring.  PCI Data: Vessel - right PDA/Segment - saphenous vein graft proximal body Percent Stenosis (pre)  95 TIMI-flow 3 Stent 3.0 x 16 mm Veriflex bare-metal Percent Stenosis (post) 10 TIMI-flow (post) 3  Final  Conclusions:   1. Severe three-vessel native  coronary artery disease 2. Wide patency of the LIMA to LAD and saphenous vein graft sequenced to obtuse marginal branches left circumflex 3. Severe stenosis of the saphenous vein graft to right PDA with successful coronary intervention utilizing a bare-metal stent platform 4. Probable normal left ventricular systolic  Recommendations: A bare-metal stent platform was chosen because of the patient's significant anemia with hemoglobin 8.5 mg/dL. He does not have evidence of active bleeding. Recommend continuation of aspirin and Plavix for 30 days. Case was discussed with the patient's primary physician, Dr. Eloise [name]. He will continue with outpatient evaluation for the patient's anemia  [name] 02/03/2012, 11:41 AM

## 2012-02-03 NOTE — Progress Notes (Signed)
Site area: right groin  Site Prior to Removal:  Level 0  Pressure Applied For 25 MINUTES    Minutes Beginning at 1410  Manual:   yes  Patient Status During Pull:  [id number]  Post Pull Groin Site:  Level 0  Post Pull Instructions Given:  yes  Post Pull Pulses Present:  yes  Dressing Applied:  yes  Comments:  Tolerated procedure well

## 2012-02-03 NOTE — Progress Notes (Signed)
Pt. Had chest pain at 8/10 scale  Diaphoretic, 12 lead EKG done NTG drip titrated to 28mcg./min, after 10 mins pt. Pain down to 5/10 scale but still with tightness VS taken and documented, NGT drip titrated up to 30 mcg./min and Dr. Charm Barges was notified will see pt.  Will cont to monitor pt.

## 2012-02-03 NOTE — H&P (View-Only) (Signed)
Cardiology Fellow PN  Pt with episode of CP tonight improved with increased nitro ggt and IV morphine.  EKG unchanged, septal q's.  Escalating angina due to ACS vs anemia.  Will repeat CBC, transfuse if needed. 

## 2012-02-03 NOTE — Interval H&P Note (Signed)
History and Physical Interval Note:  02/03/2012 10:37 AM  [name]  has presented today for surgery, with the diagnosis of chest pain  The various methods of treatment have been discussed with the patient and family. After consideration of risks, benefits and other options for treatment, the patient has consented to  Procedure(s) (LRB): LEFT AND RIGHT HEART CATHETERIZATION WITH CORONARY/GRAFT ANGIOGRAM (N/A) as a surgical intervention .  The patients' history has been reviewed, patient examined, no change in status, stable for surgery.  I have reviewed the patients' chart and labs.  Questions were answered to the patient's satisfaction.  H and P from 2/27 reviewed (I admitted the patient). New anemia, but patient with severe unstable angina unable to do any noncardiac workup until cath and PCI done to stabilize the patient.   [name] 02/03/2012 10:38 AM

## 2012-02-03 NOTE — Progress Notes (Signed)
Cardiology Fellow PN  Pt with episode of CP tonight improved with increased nitro ggt and IV morphine.  EKG unchanged, septal q's.  Escalating angina due to ACS vs anemia.  Will repeat CBC, transfuse if needed.

## 2012-02-04 ENCOUNTER — Other Ambulatory Visit: Payer: Self-pay

## 2012-02-04 ENCOUNTER — Encounter (HOSPITAL_COMMUNITY): Payer: Self-pay | Admitting: Physician Assistant

## 2012-02-04 DIAGNOSIS — I214 Non-ST elevation (NSTEMI) myocardial infarction: Principal | ICD-10-CM

## 2012-02-04 LAB — BASIC METABOLIC PANEL
Calcium: 9.8 mg/dL (ref 8.4–10.5)
Creatinine, Ser: 1.04 mg/dL (ref 0.50–1.35)
GFR calc Af Amer: 75 mL/min — ABNORMAL LOW (ref >90)

## 2012-02-04 LAB — CBC
MCH: 29.9 pg (ref 26.0–34.0)
MCV: 91.4 fL (ref 78.0–100.0)
Platelets: 105 10*3/uL — ABNORMAL LOW (ref 150–400)
RDW: 18.9 % — ABNORMAL HIGH (ref 11.5–15.5)

## 2012-02-04 MED ORDER — ASPIRIN 81 MG PO TABS: 81.0000 mg | ORAL_TABLET | Freq: Every day | ORAL | Status: DC

## 2012-02-04 MED ORDER — POLYSACCHARIDE IRON COMPLEX 150 MG PO CAPS: 150.0000 mg | ORAL_CAPSULE | Freq: Every day | ORAL | Status: DC

## 2012-02-04 MED ORDER — CLOPIDOGREL BISULFATE 75 MG PO TABS: 75.0000 mg | ORAL_TABLET | Freq: Every day | ORAL | Status: DC

## 2012-02-04 MED ORDER — METFORMIN HCL 500 MG PO TABS: 500.0000 mg | ORAL_TABLET | Freq: Two times a day (BID) | ORAL | Status: DC

## 2012-02-04 MED ORDER — GUAIFENESIN-DM 100-10 MG/5ML PO SYRP
15.0000 mL | ORAL_SOLUTION | ORAL | Status: DC | PRN
  Administered 2012-02-04: 15 mL via ORAL
  Filled 2012-02-04: qty 15

## 2012-02-04 MED FILL — Dextrose Inj 5%: INTRAVENOUS | Qty: 50 | Status: AC

## 2012-02-04 NOTE — Progress Notes (Signed)
    Subjective:  Feels well. No chest pain or dyspnea.   Objective:  Vital Signs in the last 24 hours: Temp:  [98 F (36.7 C)-98.6 F (37 C)] 98 F (36.7 C) (03/01 0725) Pulse Rate:  [63-95] 63  (03/01 0725) Resp:  [15-25] 22  (03/01 0725) BP: (117-155)/(45-92) 118/52 mmHg (03/01 0725) SpO2:  [93 %-97 %] 96 % (03/01 0725)  Intake/Output from previous day: 02/28 0701 - 03/01 0700 In: 1038.7 [P.O.:360; I.V.:678.7] Out: 2175 [Urine:2175]  Physical Exam: Pt is alert and oriented, NAD HEENT: normal Neck: JVP - normal Lungs: CTA bilaterally CV: RRR with 2/6 harsh systolic murmur at the LSB Abd: soft, NT, Positive BS, no hepatomegaly Ext: no C/C/E, distal pulses intact and equal. Right groin site clear Skin: warm/dry no rash  Lab Results:  Basename 02/04/12 0445 02/03/12 0029  WBC 6.5 6.3  HGB 9.0* 8.4*  PLT 105* 101*    Basename 02/04/12 0445 02/02/12 0808  NA 137 141  K 4.2 4.3  CL 103 108  CO2 24 21  GLUCOSE 132* 181*  BUN 22 25*  CREATININE 1.04 1.12    Basename 02/03/12 0021 02/02/12 1655  TROPONINI 0.54* 0.73*   Tele: sinus rhythm with few short runs of NSVT (3 beats) and pSVT (<10 beats)  Assessment/Plan:  1. NSTEMI - now stable after PCI of severe stenosis in the saphenous vein graft to PCA. Pt was treated with a bare-metal stent and plavix duration can be limited to 30 days if necessary. Pt should continue with daily ASA indefinitely unless major bleeding source is identified. Otherwise continue metoprolol, losartan, and home statin drug.  2. Anemia - likely multifactorial. HgB stable and no clear source of bleeding. Appreciate Dr Silvano Rusk consultation and he will follow-up as outpatient.  3. HTN - stable on current medical therapy.  4. Disposition - home this morning. Follow-up with Dr Daleen Squibb 2 weeks.  [name], M.D. 02/04/2012, 7:51 AM

## 2012-02-04 NOTE — Progress Notes (Signed)
   CARE MANAGEMENT NOTE 02/04/2012  Patient:  [name], [name]   Account Number:  [id number]  Date Initiated:  02/04/2012  Documentation initiated by:  GRAVES-BIGELOW,Ragina Fenter  Subjective/Objective Assessment:   Pt in for cp. Plan for home on Plavix. CM did discuss where plavix can be obtained at cheaper rate. No other needs assessed by CM.     Action/Plan:   Anticipated DC Date:  02/04/2012   Anticipated DC Plan:  HOME/SELF CARE      DC Planning Services  CM consult      Choice offered to / List presented to:             Status of service:  Completed, signed off Medicare Important Message given?   (If response is "NO", the following Medicare IM given date fields will be blank) Date Medicare IM given:   Date Additional Medicare IM given:    Discharge Disposition:  HOME/SELF CARE  Per UR Regulation:    Comments:

## 2012-02-04 NOTE — Discharge Summary (Signed)
Discharge Summary   Patient ID: [name] MRN: [id number], DOB/AGE: [birth year] 76 y.o. Admit date: 02/02/2012 D/C date:     02/04/2012   Primary Discharge Diagnoses:  1. CAD with NSTEMI this admission - s/p BMS to SVG->right PDA 02/03/12 - s/p CABG 1995 - 2D echo EF 40-45% 02/02/12 2. Anemia with plan for outpatient follow-up (Dr. Eloise Harman saw this admission)  Secondary Discharge Diagnoses:  1. Cerebrovascular disease - right ICA < 60%, left ICA < 40%, 01/2012  2. HTN 3. HL 4. Diabetes Mellitus 5. Herpes zoster   Hospital Course: 78 y/o M with hx of CAD presented to [facility] with complaints of chest pain initially only occurring with exertion, but eventually progressing to rest pain. He has also noticed a significant increase in his dyspnea on exertion. Because of symptoms he called EMS and received ASA/NTG. EKG showed sinus rhythm with diffuse inferolateral ST depression, no ST elevation. He was initially placed on heparin but this was discontinued because of his anemia with Hgb of 8.5. He denied melena or any obvious bleeding and hemoccult was negative. His BNP was mildly elevated and thus he received Lasix 60mg  in the ER. Enzymes returned positive with a peak troponin of 0.73. 2D echo was obtained demonstrating EF 40-45%. ON 02/03/12 he had recurrent CP requiring titration of his NTG gtt and morphine. EKG was unchanged with septal q waves. He undewent left heart cath 2/28 showing the following:  Final Conclusions:  1. Severe three-vessel native coronary artery disease  2. Wide patency of the LIMA to LAD and saphenous vein graft sequenced to obtuse marginal branches left circumflex  3. Severe stenosis of the saphenous vein graft to right PDA with successful coronary intervention utilizing a bare-metal stent platform  4. Probable normal left ventricular systolic   A BMS was chosen because of the patient's anemia. He was seen by Dr. Eloise Harman as an inpatient for his anemia,  who noted Hgb of 9.4 11/2011 - he felt the patient's anemia was multifactorial in origin, with borderline iron deficiency on labs with somewhat low reticulocyte count. He recommended iron supplementation with plans for possible hematology eval as an outpatient. Today the patient is doing well. His Hgb is 9 today. Dr. Excell Seltzer has recommended that Plavix duration can be limited to 30 days if necessary, and that pt should continue with daily ASA indefinitely unless major bleeding source is identified. Dr. Excell Seltzer has seen and examined him today and feels he is stable for discharge.   Discharge Vitals: Blood pressure 118/52, pulse 63, temperature 98 F (36.7 C), temperature source Oral, resp. rate 22, height 6\' 1"  (1.854 m), weight 192 lb 3.9 oz (87.2 kg), SpO2 96.00%.  Labs: Lab Results  Component Value Date   WBC 6.5 02/04/2012   HGB 9.0* 02/04/2012   HCT 27.5* 02/04/2012   MCV 91.4 02/04/2012   PLT 105* 02/04/2012    Lab 02/04/12 0445 02/02/12 0910  NA 137 --  K 4.2 --  CL 103 --  CO2 24 --  BUN 22 --  CREATININE 1.04 --  CALCIUM 9.8 --  PROT -- 6.1  BILITOT -- 0.5  ALKPHOS -- 104  ALT -- 10  AST -- 20  GLUCOSE 132* --    Basename 02/03/12 0021 02/02/12 1655 02/02/12 0909  CKTOTAL 59 64 67  CKMB 3.3 3.8 3.5  TROPONINI 0.54* 0.73* 0.39*   Lab Results  Component Value Date   CHOL 111 02/02/2012   HDL 24* 02/02/2012  LDLCALC 72 02/02/2012   TRIG 76 02/02/2012    Diagnostic Studies/Procedures   1. Cst Portable 1 View 227/2013  *RADIOLOGY REPORT*  Clinical Data: Shortness of breath, chest pain  PORTABLE CHEST - 1 VIEW  Comparison: Chest x-ray of 06/19/2004  Findings: Mild basilar atelectasis is present.  Moderate cardiomegaly is stable.  Median sternotomy sutures are noted from prior CABG. No bony abnormality is seen.  IMPRESSION: Stable cardiomegaly.  No active process.  Minimal basilar atelectasis.  Original Report Authenticated By: Juline Patch, M.D.   2. 2Decho 02/02/12 Study  Conclusions  - Left ventricle: The cavity size was mildly dilated. Wall thickness was normal. Systolic function was mildly to moderately reduced. The estimated ejection fraction was in the range of 40% to 45%. There is hypokinesis of the anteroseptal myocardium. - Aortic valve: Valve mobility was restricted. There was moderate stenosis. Mild regurgitation. Valve area: 1.1cm^2(VTI). Valve area: 1.18cm^2 (Vmax). - Mitral valve: Calcified annulus. Mild regurgitation. - Left atrium: The atrium was moderately dilated. - Right atrium: The atrium was mildly dilated. - Tricuspid valve: Moderate regurgitation. - Pulmonary arteries: Systolic pressure was mildly to moderately increased. Impressions: - Technically difficult; LV function difficult to quantitate but appears mild to moderately reduced.  Discharge Medications   Medication List  As of 02/04/2012  9:18 AM   STOP taking these medications         meloxicam 15 MG tablet      penicillin v potassium 500 MG tablet         TAKE these medications         aspirin 81 MG tablet   Take 1 tablet (81 mg total) by mouth daily.      clopidogrel 75 MG tablet   Commonly known as: PLAVIX   Take 1 tablet (75 mg total) by mouth daily with breakfast. Please talk with your cardiologist before refilling this medicine to determine if you need to keep taking it.       gabapentin 300 MG capsule   Commonly known as: NEURONTIN   Take 300 mg by mouth 3 (three) times daily. For shingles pain      HYDROcodone-acetaminophen 5-325 MG per tablet   Commonly known as: NORCO   Take 1 tablet by mouth 3 (three) times daily as needed. Pt stated he tries to take as scheduled for shingles pain      iron polysaccharides 150 MG capsule   Commonly known as: NIFEREX   Take 1 capsule (150 mg total) by mouth daily.Follow up with your primary care doctor for this medicine.       losartan 100 MG tablet   Commonly known as: COZAAR   Take 1 tablet by mouth Daily.       metFORMIN 500 MG tablet   Commonly known as: GLUCOPHAGE   Take 1 tablet (500 mg total) by mouth 2 (two) times daily with a meal. Instructed not to restart until 02/04/12. This medicine is less preferred in patients who have history of heart dysfunction so please talk to primary doctor about alternatives.      metoprolol succinate 25 MG 24 hr tablet   Commonly known as: TOPROL-XL   Take 25 mg by mouth daily.      nitroGLYCERIN 0.4 MG SL tablet   Commonly known as: NITROSTAT   Place 0.4 mg under the tongue every 5 (five) minutes as needed.      pravastatin 40 MG tablet   Commonly known as: PRAVACHOL   Take 40  mg by mouth daily.            Disposition   The patient will be discharged in stable condition to home. Discharge Orders    Future Appointments: Provider: Department: Dept Phone: Center:   02/17/2012 11:10 AM Beatrice Lecher, PA [facility] [id number]     Future Orders Please Complete By Expires   Diet - low sodium heart healthy      Increase activity slowly      Comments:   No driving for 2 days. No lifting over 5 lbs for 1 week. No sexual activity for 1 week. Keep procedure site clean & dry. If you notice increased pain, swelling, bleeding or pus, call/return!  You may shower, but no soaking baths/hot tubs/pools for 1 week.       Follow-up Information    Follow up with PATERSON,DANIEL G, MD. (Please follow up within 1 week for further evaluation/monitoring of your anemia)       Follow up with Tereso Newcomer, PA. (At Dr. Vern Claude office on Thursday 02/17/12 at 11am)    Contact information:   [address] [place] [address] [phone]            Duration of Discharge Encounter: 40 minutes including physician and PA time.  Signed, Kriste Basque Yani Lal PA-C 02/04/2012, 9:18 AM

## 2012-02-04 NOTE — Progress Notes (Signed)
CARDIAC REHAB PHASE I   PRE:  Rate/Rhythm: 63 SR  BP:  Supine: 118/52  Sitting:   Standing:    SaO2: 97 RA  MODE:  Ambulation: 680 ft   POST:  Rate/Rhythem: 94 SR  BP:  Supine:   Sitting: 130/53  Standing:    SaO2: 97 RA [id number] Tolerated ambulation without c/o of cp. He is DOE,but  RA sats 97% before and after walk. Completed discharge education with pt. He agrees to [employer]. CRP in [place], will send referral.  [name]

## 2012-02-05 NOTE — Discharge Summary (Signed)
Agree as outlined. See my progress note this same date.  [name] 02/05/2012 1:50 PM

## 2012-02-06 ENCOUNTER — Encounter (HOSPITAL_COMMUNITY): Payer: Self-pay | Admitting: Emergency Medicine

## 2012-02-06 ENCOUNTER — Inpatient Hospital Stay (HOSPITAL_COMMUNITY)
Admission: EM | Admit: 2012-02-06 | Discharge: 2012-02-08 | Disposition: A | Discharge status: Home / Self Care | Payer: Medicare Other | Source: Home / Self Care | Attending: Cardiology | Admitting: Cardiology

## 2012-02-06 DIAGNOSIS — D649 Anemia, unspecified: Secondary | ICD-10-CM

## 2012-02-06 DIAGNOSIS — I10 Essential (primary) hypertension: Secondary | ICD-10-CM | POA: Insufficient documentation

## 2012-02-06 DIAGNOSIS — Z7902 Long term (current) use of antithrombotics/antiplatelets: Secondary | ICD-10-CM

## 2012-02-06 DIAGNOSIS — I214 Non-ST elevation (NSTEMI) myocardial infarction: Secondary | ICD-10-CM | POA: Diagnosis present

## 2012-02-06 DIAGNOSIS — R079 Chest pain, unspecified: Secondary | ICD-10-CM | POA: Diagnosis present

## 2012-02-06 DIAGNOSIS — Z951 Presence of aortocoronary bypass graft: Secondary | ICD-10-CM

## 2012-02-06 DIAGNOSIS — E119 Type 2 diabetes mellitus without complications: Secondary | ICD-10-CM | POA: Diagnosis present

## 2012-02-06 DIAGNOSIS — Z7982 Long term (current) use of aspirin: Secondary | ICD-10-CM

## 2012-02-06 DIAGNOSIS — I2510 Atherosclerotic heart disease of native coronary artery without angina pectoris: Secondary | ICD-10-CM | POA: Diagnosis present

## 2012-02-06 DIAGNOSIS — R600 Localized edema: Secondary | ICD-10-CM

## 2012-02-06 DIAGNOSIS — E785 Hyperlipidemia, unspecified: Secondary | ICD-10-CM | POA: Diagnosis present

## 2012-02-06 DIAGNOSIS — R609 Edema, unspecified: Secondary | ICD-10-CM | POA: Diagnosis present

## 2012-02-06 DIAGNOSIS — Z79899 Other long term (current) drug therapy: Secondary | ICD-10-CM

## 2012-02-06 NOTE — ED Notes (Addendum)
Chest pain started arounf 8pm tonight.  PT took nitro one at 8pm and fell asleep.  Woken at 1045 with sever pain in chest, took another nitro.  Pain was 10/10 across chest and in both arms.  Called EMS shortly after second.  Pain went from 7 to a 2 after nitro.  EMS gave nitro in route.  Pt states he had SOB only when he was in pain and slight sweating.  Pt co nausea but denies vomiting.  Pain is left chest and radiates to both arms.

## 2012-02-06 NOTE — ED Notes (Addendum)
Pt in [facility] emergency room last week for same symptoms and received stent.  Pt states he had "slight" heart attack.  [place] in 1995.

## 2012-02-06 NOTE — ED Notes (Signed)
EMS also gave 4 aspirin in route

## 2012-02-06 NOTE — ED Notes (Addendum)
Pt co headache all day.  Not like any other headache he has had.  The headache was much more intense 7/10.  Pt has shingles in his right eye and has had since may.

## 2012-02-07 ENCOUNTER — Other Ambulatory Visit: Payer: Self-pay

## 2012-02-07 ENCOUNTER — Emergency Department (HOSPITAL_COMMUNITY): Payer: Medicare Other

## 2012-02-07 DIAGNOSIS — R079 Chest pain, unspecified: Secondary | ICD-10-CM

## 2012-02-07 LAB — DIFFERENTIAL
Eosinophils Absolute: 0.2 10*3/uL (ref 0.0–0.7)
Lymphocytes Relative: 23 % (ref 12–46)
Lymphs Abs: 1.1 10*3/uL (ref 0.7–4.0)
Monocytes Relative: 15 % — ABNORMAL HIGH (ref 3–12)
Neutro Abs: 2.8 10*3/uL (ref 1.7–7.7)
Neutrophils Relative %: 59 % (ref 43–77)

## 2012-02-07 LAB — PROTIME-INR
INR: 1.03 (ref 0.00–1.49)
Prothrombin Time: 13.7 seconds (ref 11.6–15.2)

## 2012-02-07 LAB — POCT I-STAT, CHEM 8
Chloride: 108 mEq/L (ref 96–112)
HCT: 24 % — ABNORMAL LOW (ref 39.0–52.0)
Hemoglobin: 8.2 g/dL — ABNORMAL LOW (ref 13.0–17.0)
Potassium: 4.4 mEq/L (ref 3.5–5.1)
Sodium: 139 mEq/L (ref 135–145)

## 2012-02-07 LAB — CARDIAC PANEL(CRET KIN+CKTOT+MB+TROPI)
CK, MB: 2.4 ng/mL (ref 0.3–4.0)
CK, MB: 2.9 ng/mL (ref 0.3–4.0)
Relative Index: INVALID (ref 0.0–2.5)
Relative Index: INVALID (ref 0.0–2.5)
Total CK: 49 U/L (ref 7–232)
Total CK: 43 U/L (ref 7–232)

## 2012-02-07 LAB — CBC
Hemoglobin: 8.4 g/dL — ABNORMAL LOW (ref 13.0–17.0)
MCH: 29.9 pg (ref 26.0–34.0)
RBC: 2.81 MIL/uL — ABNORMAL LOW (ref 4.22–5.81)
WBC: 4.8 10*3/uL (ref 4.0–10.5)

## 2012-02-07 LAB — GLUCOSE, CAPILLARY: Glucose-Capillary: 97 mg/dL (ref 70–99)

## 2012-02-07 LAB — PROTEIN ELECTROPHORESIS, SERUM
Alpha-1-Globulin: 6.8 % — ABNORMAL HIGH (ref 2.9–4.9)
Alpha-2-Globulin: 14.6 % — ABNORMAL HIGH (ref 7.1–11.8)
Beta 2: 2.5 % — ABNORMAL LOW (ref 3.2–6.5)
Beta Globulin: 5.9 % (ref 4.7–7.2)
Gamma Globulin: 8.1 % — ABNORMAL LOW (ref 11.1–18.8)

## 2012-02-07 LAB — APTT: aPTT: 29 seconds (ref 24–37)

## 2012-02-07 LAB — POCT I-STAT TROPONIN I: Troponin i, poc: 0.38 ng/mL (ref 0.00–0.08)

## 2012-02-07 LAB — PREPARE RBC (CROSSMATCH)

## 2012-02-07 MED ORDER — CLOPIDOGREL BISULFATE 75 MG PO TABS
75.0000 mg | ORAL_TABLET | Freq: Every day | ORAL | Status: DC
  Administered 2012-02-07 – 2012-02-08 (×2): 75 mg via ORAL
  Filled 2012-02-07 (×3): qty 1

## 2012-02-07 MED ORDER — NITROGLYCERIN 0.4 MG SL SUBL: 0.4000 mg | SUBLINGUAL_TABLET | SUBLINGUAL | Status: DC | PRN

## 2012-02-07 MED ORDER — LOSARTAN POTASSIUM 50 MG PO TABS
100.0000 mg | ORAL_TABLET | Freq: Every day | ORAL | Status: DC
  Administered 2012-02-07: 100 mg via ORAL
  Filled 2012-02-07 (×2): qty 2

## 2012-02-07 MED ORDER — HYDROCODONE-ACETAMINOPHEN 5-325 MG PO TABS
1.0000 | ORAL_TABLET | Freq: Four times a day (QID) | ORAL | Status: DC | PRN
  Administered 2012-02-07 – 2012-02-08 (×3): 1 via ORAL
  Filled 2012-02-07 (×3): qty 1

## 2012-02-07 MED ORDER — SIMVASTATIN 20 MG PO TABS
20.0000 mg | ORAL_TABLET | Freq: Every day | ORAL | Status: DC
  Administered 2012-02-07: 20 mg via ORAL
  Filled 2012-02-07 (×2): qty 1

## 2012-02-07 MED ORDER — ACETAMINOPHEN 325 MG PO TABS
650.0000 mg | ORAL_TABLET | ORAL | Status: DC | PRN
  Administered 2012-02-07: 650 mg via ORAL
  Filled 2012-02-07: qty 2

## 2012-02-07 MED ORDER — POLYSACCHARIDE IRON COMPLEX 150 MG PO CAPS
150.0000 mg | ORAL_CAPSULE | Freq: Every day | ORAL | Status: DC
  Administered 2012-02-07: 150 mg via ORAL
  Filled 2012-02-07 (×3): qty 1

## 2012-02-07 MED ORDER — HYDROCORTISONE 1 % EX CREA
1.0000 "application " | TOPICAL_CREAM | Freq: Three times a day (TID) | CUTANEOUS | Status: DC | PRN
  Filled 2012-02-07 (×2): qty 28

## 2012-02-07 MED ORDER — INSULIN ASPART 100 UNIT/ML ~~LOC~~ SOLN
0.0000 [IU] | Freq: Three times a day (TID) | SUBCUTANEOUS | Status: DC
  Administered 2012-02-08: 1 [IU] via SUBCUTANEOUS
  Filled 2012-02-07: qty 3

## 2012-02-07 MED ORDER — ASPIRIN 81 MG PO CHEW
81.0000 mg | CHEWABLE_TABLET | Freq: Every day | ORAL | Status: DC
  Administered 2012-02-07: 81 mg via ORAL
  Filled 2012-02-07 (×2): qty 1

## 2012-02-07 MED ORDER — METOPROLOL SUCCINATE ER 25 MG PO TB24
25.0000 mg | ORAL_TABLET | Freq: Every day | ORAL | Status: DC
  Administered 2012-02-07: 25 mg via ORAL
  Filled 2012-02-07 (×2): qty 1

## 2012-02-07 MED ORDER — GABAPENTIN 300 MG PO CAPS
300.0000 mg | ORAL_CAPSULE | Freq: Three times a day (TID) | ORAL | Status: DC
  Administered 2012-02-07 (×2): 300 mg via ORAL
  Filled 2012-02-07 (×5): qty 1

## 2012-02-07 MED ORDER — ONDANSETRON HCL 4 MG/2ML IJ SOLN: 4.0000 mg | Freq: Four times a day (QID) | INTRAMUSCULAR | Status: DC | PRN

## 2012-02-07 NOTE — ED Notes (Signed)
Report received, 1st contact with pt and will assume care of pt at this time. MD Turner at bedside for pt evaluation and orders for inpt admission stay.

## 2012-02-07 NOTE — ED Provider Notes (Signed)
History     CSN: [id number]  Arrival date & time 02/06/12  2340   First MD Initiated Contact with Patient 02/06/12 2358      Chief Complaint  Patient presents with  . Chest Pain    (Consider location/radiation/quality/duration/timing/severity/associated sxs/prior treatment) HPI  Patient was actually seen by me on February 27 when he presented with chest pain and EKG consistent with subendocardial ischemia. He was also noted to be anemic at that time. Looking at his records he had a cardiac cath done by Dr. Excell Seltzer on February 28 showing his saphenous vein graft to the right PDA had a blockage and he had a bare-metal's stent placed. Patient relates he was discharged from the hospital 2 days ago. He states yesterday he felt fine. He states this afternoon he had some mild discomfort which she tried to ignore. He states about 8 PM the pain got worse and he took one nitroglycerin and was able to fall asleep. He relates however 10 PM he was awakened with pain and states the pain was diffusely across his anterior chest. He relates there is an area in his left chest where the pain was most intense. He related he had diaphoresis, nausea without vomiting, and shortness of breath. He relates his pain tonight at its worst was 9 and currently it was 0. He was given aspirin by EMS as well as more nitroglycerin to  PCP [name] Cardiologist Dr. Daleen Squibb with [facility]  Past Medical History  Diagnosis Date  . CAD (coronary artery disease)     s/p CABG 1995. NSTEMI & subsequent BMS to SVG->right PDA 02/03/12. EF 40-45% by echo 02/02/12.  Marland Kitchen Cerebrovascular disease, unspecified   . Unspecified essential hypertension   . Other and unspecified hyperlipidemia   . Type II or unspecified type diabetes mellitus without mention of complication, not stated as uncontrolled   . Osteoarthritis (arthritis due to wear and tear of joints)   . Asthmatic bronchitis   . BPH (benign prostatic hypertrophy)   . Herpes zoster    . Arthritis   . Anemia     Past Surgical History  Procedure Date  . Coronary artery bypass graft 1995  . Rotator cuff repair   . Carpal tunnel release   . Incisional hernia repair   . Kidney surgery   . Back surgery     Family History  Problem Relation Age of Onset  . Coronary artery disease    . Alcohol abuse      History  Substance Use Topics  . Smoking status: Former Games developer  . Smokeless tobacco: Never Used   Comment: quit in 1986  . Alcohol Use: No  lives with spouse    Review of Systems  All other systems reviewed and are negative.    Allergies  Azithromycin; Macrodantin; Minocin; Minocycline hcl; Nitrofurantoin; and Zithromax  Home Medications   Current Outpatient Rx  Name Route Sig Dispense Refill  . ASPIRIN 81 MG PO TABS Oral Take 1 tablet (81 mg total) by mouth daily.    Marland Kitchen CLOPIDOGREL BISULFATE 75 MG PO TABS Oral Take 1 tablet (75 mg total) by mouth daily with breakfast. 30 tablet 1    Please talk with your cardiologist before refillin ...  . GABAPENTIN 300 MG PO CAPS Oral Take 300 mg by mouth 3 (three) times daily. For shingles pain    . HYDROCODONE-ACETAMINOPHEN 5-325 MG PO TABS Oral Take 1 tablet by mouth 3 (three) times daily as needed. Pt stated he tries  to take as scheduled for shingles pain    . POLYSACCHARIDE IRON COMPLEX 150 MG PO CAPS Oral Take 1 capsule (150 mg total) by mouth daily. 30 capsule 2    Follow up with your primary care doctor for this m ...  . LOSARTAN POTASSIUM 100 MG PO TABS Oral Take 1 tablet by mouth Daily.    Marland Kitchen METFORMIN HCL 500 MG PO TABS Oral Take 1 tablet (500 mg total) by mouth 2 (two) times daily with a meal.      IMPORTANT: Do not restart until 02/06/12. This Charity fundraiser ...  . METOPROLOL SUCCINATE ER 25 MG PO TB24 Oral Take 25 mg by mouth daily.     Marland Kitchen PRAVASTATIN SODIUM 40 MG PO TABS Oral Take 40 mg by mouth daily.    Marland Kitchen NITROGLYCERIN 0.4 MG SL SUBL Sublingual Place 0.4 mg under the tongue every 5 (five) minutes as needed.        BP 120/53  Pulse 62  Resp 20  SpO2 98%  Vital signs normal    Physical Exam  Nursing note and vitals reviewed. Constitutional: He is oriented to person, place, and time. He appears well-developed and well-nourished.  Non-toxic appearance. He does not appear ill. No distress.  HENT:  Head: Normocephalic and atraumatic.  Right Ear: External ear normal.  Left Ear: External ear normal.  Nose: Nose normal. No mucosal edema or rhinorrhea.  Mouth/Throat: Oropharynx is clear and moist and mucous membranes are normal. No dental abscesses or uvula swelling.  Eyes: Conjunctivae and EOM are normal. Pupils are equal, round, and reactive to light.  Neck: Normal range of motion and full passive range of motion without pain. Neck supple.  Cardiovascular: Normal rate, regular rhythm and normal heart sounds.  Exam reveals no gallop and no friction rub.   No murmur heard. Pulmonary/Chest: Effort normal and breath sounds normal. No respiratory distress. He has no wheezes. He has no rhonchi. He has no rales. He exhibits no tenderness and no crepitus.  Abdominal: Soft. Normal appearance and bowel sounds are normal. He exhibits no distension. There is no tenderness. There is no rebound and no guarding.  Musculoskeletal: Normal range of motion. He exhibits no edema and no tenderness.       Moves all extremities well.   Neurological: He is alert and oriented to person, place, and time. He has normal strength. No cranial nerve deficit.  Skin: Skin is warm, dry and intact. No rash noted. No erythema. No pallor.       Patient does not appear to be as pale as he was the last time I admitted him.  Psychiatric: He has a normal mood and affect. His speech is normal and behavior is normal. His mood appears not anxious.    ED Course  Procedures (including critical care time)   01:08 Dr Norris Cross will come see patient for admission.   Results for orders placed during the hospital encounter of 02/06/12  CBC       Component Value Range   WBC 4.8  4.0 - 10.5 (K/uL)   RBC 2.81 (*) 4.22 - 5.81 (MIL/uL)   Hemoglobin 8.4 (*) 13.0 - 17.0 (g/dL)   HCT 81.1 (*) 91.4 - 52.0 (%)   MCV 92.9  78.0 - 100.0 (fL)   MCH 29.9  26.0 - 34.0 (pg)   MCHC 32.2  30.0 - 36.0 (g/dL)   RDW 78.2 (*) 95.6 - 15.5 (%)   Platelets PENDING  150 - 400 (K/uL)  DIFFERENTIAL      Component Value Range   Neutrophils Relative 59  43 - 77 (%)   Neutro Abs 2.8  1.7 - 7.7 (K/uL)   Lymphocytes Relative 23  12 - 46 (%)   Lymphs Abs 1.1  0.7 - 4.0 (K/uL)   Monocytes Relative 15 (*) 3 - 12 (%)   Monocytes Absolute 0.7  0.1 - 1.0 (K/uL)   Eosinophils Relative 3  0 - 5 (%)   Eosinophils Absolute 0.2  0.0 - 0.7 (K/uL)   Basophils Relative 1  0 - 1 (%)   Basophils Absolute 0.0  0.0 - 0.1 (K/uL)  POCT I-STAT, CHEM 8      Component Value Range   Sodium 139  135 - 145 (mEq/L)   Potassium 4.4  3.5 - 5.1 (mEq/L)   Chloride 108  96 - 112 (mEq/L)   BUN 31 (*) 6 - 23 (mg/dL)   Creatinine, Ser 4.09  0.50 - 1.35 (mg/dL)   Glucose, Bld 811 (*) 70 - 99 (mg/dL)   Calcium, Ion 9.14  7.82 - 1.32 (mmol/L)   TCO2 23  0 - 100 (mmol/L)   Hemoglobin 8.2 (*) 13.0 - 17.0 (g/dL)   HCT 95.6 (*) 21.3 - 52.0 (%)  POCT I-STAT TROPONIN I      Component Value Range   Troponin i, poc 0.38 (*) 0.00 - 0.08 (ng/mL)   Comment NOTIFIED PHYSICIAN     Comment 3            Laboratory interpretation all normal except stable anemia, + troponin, last in hospital was 0.54 on 2/28    Dg Chest Portable 1 View  02/02/2012  *RADIOLOGY REPORT*  Clinical Data: Shortness of breath, chest pain  PORTABLE CHEST - 1 VIEW  Comparison: Chest x-ray of 06/19/2004  Findings: Mild basilar atelectasis is present.  Moderate cardiomegaly is stable.  Median sternotomy sutures are noted from prior CABG. No bony abnormality is seen.  IMPRESSION: Stable cardiomegaly.  No active process.  Minimal basilar atelectasis.  Original Report Authenticated By: Juline Patch, M.D.     Date:  02/07/2012  Rate: 59  Rhythm: sinus bradycardia and premature atrial contractions (PAC)  QRS Axis: normal  Intervals: normal  ST/T Wave abnormalities: normal  Conduction Disutrbances:IRBBB  Narrative Interpretation:   Old EKG Reviewed: unchanged from 02/04/2012    1. Chest pain at rest    Plan admission  Devoria Albe, MD, FACEP    MDM  Pt admitted 2/27 and NSTEMI with new bare metal stent placed on 2/28 with recurrent chest pain tonight. Elevated troponin may be on downslope from prior elevation or may be on the rise. Admitted to reevaluate his stent. Pt has been started on plavix in the hospital.           Ward Givens, MD 02/07/12 [phone]

## 2012-02-07 NOTE — ED Notes (Signed)
Pt currently denies CP

## 2012-02-07 NOTE — ED Notes (Signed)
Pt resting quietly,no s/s of any pain or distress observed. Pt denies any pain or complaints at this time, family at bedside. Plan of care is updated with verbal understanding, will continue to monitor pt.

## 2012-02-07 NOTE — ED Notes (Signed)
Attempted to call report, RN Alycia Rossetti unavailable at this time and to call back for pt report. Will continue to monitor pt.

## 2012-02-07 NOTE — Progress Notes (Signed)
    Subjective:  No further chest pain or dyspnea. The patient has no complaints this morning. His episode last night lasted about 15 minutes and was similar to his previous angina, but the severity was worse.  Objective:  Vital Signs in the last 24 hours: Temp:  [96.6 F (35.9 C)-98.3 F (36.8 C)] 96.6 F (35.9 C) (03/04 0500) Pulse Rate:  [57-78] 57  (03/04 0500) Resp:  [18-20] 18  (03/04 0500) BP: (108-127)/(46-69) 127/67 mmHg (03/04 1017) SpO2:  [98 %-100 %] 98 % (03/04 0500)  Intake/Output from previous day:    Physical Exam: Pt is alert and oriented, elderly male in NAD HEENT: normal Neck: JVP - normal, carotids 2+= without bruits Lungs: CTA bilaterally CV: RRR with grade 3/6 systolic murmur at the left sternal border Abd: soft, NT, Positive BS, no hepatomegaly Ext: no C/C/E, distal pulses intact and equal Skin: warm/dry no rash   Lab Results:  Basename 02/07/12 0044 02/07/12 0019  WBC -- 4.8  HGB 8.2* 8.4*  PLT -- 100*    Basename 02/07/12 0044  NA 139  K 4.4  CL 108  CO2 --  GLUCOSE 141*  BUN 31*  CREATININE 0.90    Basename 02/07/12 0915 02/07/12 0412  TROPONINI 0.57* 0.62*    Tele sinus rhythm without arrhythmia  Assessment/Plan:  1. Chest pain. The patient had recurrent angina last night, concerning and someone had a recently placed stent. His EKG shows no acute abnormalities in his minor troponin elevation is stable from previous. Recommend continued medical therapy with plans for blood transfusion today considering his hemoglobin of 8. Will ambulate this afternoon and evening if he has no recurrent angina would consider continued medical therapy. If he has recurrent anginal symptoms we will need to proceed with relook cardiac catheterization.  2. Anemia. 2 units of packed red blood cells ordered for today. Workup of anemia source is ongoing.  3. Disposition: The patient will be observed overnight and will continue with his current medical  therapy. Repeat CBC in the morning to monitor his response to the blood transfusion.  [name], M.D. 02/07/2012, 12:10 PM

## 2012-02-07 NOTE — H&P (Signed)
PCP: Eloise Harman  Primary Cardiologist: [facility]  CC: Chest pain, recent PCI of SVG-PDA  HPI: 25 YOWM with PMH of CAD s/p recent PCI SVG-PDA (BMS 3x16) on 2/28 in the setting of a small NSTEMI (troponin peak of 0.7) who presents with recurrent chest pain.  He did well at discharge and has been compliant with medications.  At 8pm, he was sitting down and developed anterior chest pain, nausea and diaphoresis.  He took a NTG and the symptoms subsided.  Around 10pm, he had a similar episode and called 911.  Currently, he is chest pain free.    He denies CHF symptoms or syncope.  Of note, he was recently diagnosed with anemia of unclear etiology and this is currently being worked up.     Past Medical History  Diagnosis Date  . CAD (coronary artery disease)     s/p CABG 1995. NSTEMI & subsequent BMS to SVG->right PDA 02/03/12. EF 40-45% by echo 02/02/12.  Marland Kitchen Cerebrovascular disease, unspecified   . Unspecified essential hypertension   . Other and unspecified hyperlipidemia   . Type II or unspecified type diabetes mellitus without mention of complication, not stated as uncontrolled   . Osteoarthritis (arthritis due to wear and tear of joints)   . Asthmatic bronchitis   . BPH (benign prostatic hypertrophy)   . Herpes zoster   . Arthritis   . Anemia     History   Social History  . Marital Status: Married    Spouse Name: N/A    Number of Children: N/A  . Years of Education: N/A   Occupational History  . retired [employer]   Social History Main Topics  . Smoking status: Former Games developer  . Smokeless tobacco: Never Used   Comment: quit in 1986  . Alcohol Use: No  . Drug Use: No  . Sexually Active: Not Currently   Other Topics Concern  . Not on file   Social History Narrative  . No narrative on file  Negative for tobacco, retired, married.  Family History  Problem Relation Age of Onset  . Coronary artery disease    . Alcohol abuse    Brother died in 31s of MI.   No current  facility-administered medications on file as of 02/06/2012.   Medications Prior to Admission  Medication Sig Dispense Refill  . aspirin 81 MG tablet Take 1 tablet (81 mg total) by mouth daily.      . clopidogrel (PLAVIX) 75 MG tablet Take 1 tablet (75 mg total) by mouth daily with breakfast.  30 tablet  1  . gabapentin (NEURONTIN) 300 MG capsule Take 300 mg by mouth 3 (three) times daily. For shingles pain      . HYDROcodone-acetaminophen (NORCO) 5-325 MG per tablet Take 1 tablet by mouth 3 (three) times daily as needed. Pt stated he tries to take as scheduled for shingles pain      . iron polysaccharides (NIFEREX) 150 MG capsule Take 1 capsule (150 mg total) by mouth daily.  30 capsule  2  . losartan (COZAAR) 100 MG tablet Take 1 tablet by mouth Daily.      . metFORMIN (GLUCOPHAGE) 500 MG tablet Take 1 tablet (500 mg total) by mouth 2 (two) times daily with a meal.      . metoprolol succinate (TOPROL-XL) 25 MG 24 hr tablet Take 25 mg by mouth daily.       . pravastatin (PRAVACHOL) 40 MG tablet Take 40 mg by mouth daily.      Marland Kitchen  nitroGLYCERIN (NITROSTAT) 0.4 MG SL tablet Place 0.4 mg under the tongue every 5 (five) minutes as needed.        Allergies  Allergen Reactions  . Azithromycin Itching  . Macrodantin Itching  . Minocin Itching  . Minocycline Hcl Itching  . Nitrofurantoin Itching  . Zithromax (Azithromycin Dihydrate) Itching    Review of Systems: All systems reviewed and are negative except as mentioned above in the history of present illness.    PHYSICAL EXAM: Filed Vitals:   02/07/12 0100  BP: 108/49  Pulse: 57  Resp: 18   GENERAL: No acute distress.   HEENT: Normocephalic, atraumatic.  Oropharynx is pink and moist without lesions.  NECK: Supple, no LAD, no JVD, no masses. CV: Regular rate and rhythm with no murmurs, rubs, or gallops.   LUNGS: Clear to auscultation bilaterally.   ABDOMEN: +BS, soft, nontender, nondistended.  EXTREMITIES: No clubbing, cyanosis, or  edema.   NEURO: AO x 3, no focal deficits. PYSCH: Normal affect. SKIN: No rashes.    ECG: SR 60 BPM with frequent PACs, no STTW changes, no ischemia.   Results for orders placed during the hospital encounter of 02/06/12 (from the past 24 hour(s))  CBC     Status: Abnormal (Preliminary result)   Collection Time   02/07/12 12:19 AM      Component Value Range   WBC 4.8  4.0 - 10.5 (K/uL)   RBC 2.81 (*) 4.22 - 5.81 (MIL/uL)   Hemoglobin 8.4 (*) 13.0 - 17.0 (g/dL)   HCT 16.1 (*) 09.6 - 52.0 (%)   MCV 92.9  78.0 - 100.0 (fL)   MCH 29.9  26.0 - 34.0 (pg)   MCHC 32.2  30.0 - 36.0 (g/dL)   RDW 04.5 (*) 40.9 - 15.5 (%)   Platelets PENDING  150 - 400 (K/uL)  DIFFERENTIAL     Status: Abnormal   Collection Time   02/07/12 12:19 AM      Component Value Range   Neutrophils Relative 59  43 - 77 (%)   Neutro Abs 2.8  1.7 - 7.7 (K/uL)   Lymphocytes Relative 23  12 - 46 (%)   Lymphs Abs 1.1  0.7 - 4.0 (K/uL)   Monocytes Relative 15 (*) 3 - 12 (%)   Monocytes Absolute 0.7  0.1 - 1.0 (K/uL)   Eosinophils Relative 3  0 - 5 (%)   Eosinophils Absolute 0.2  0.0 - 0.7 (K/uL)   Basophils Relative 1  0 - 1 (%)   Basophils Absolute 0.0  0.0 - 0.1 (K/uL)  APTT     Status: Normal   Collection Time   02/07/12 12:19 AM      Component Value Range   aPTT 29  24 - 37 (seconds)  PROTIME-INR     Status: Normal   Collection Time   02/07/12 12:19 AM      Component Value Range   Prothrombin Time 13.7  11.6 - 15.2 (seconds)   INR 1.03  0.00 - 1.49   POCT I-STAT TROPONIN I     Status: Abnormal   Collection Time   02/07/12 12:42 AM      Component Value Range   Troponin i, poc 0.38 (*) 0.00 - 0.08 (ng/mL)   Comment NOTIFIED PHYSICIAN     Comment 3           POCT I-STAT, CHEM 8     Status: Abnormal   Collection Time   02/07/12 12:44 AM  Component Value Range   Sodium 139  135 - 145 (mEq/L)   Potassium 4.4  3.5 - 5.1 (mEq/L)   Chloride 108  96 - 112 (mEq/L)   BUN 31 (*) 6 - 23 (mg/dL)   Creatinine, Ser 9.60   0.50 - 1.35 (mg/dL)   Glucose, Bld 454 (*) 70 - 99 (mg/dL)   Calcium, Ion 0.98  1.19 - 1.32 (mmol/L)   TCO2 23  0 - 100 (mmol/L)   Hemoglobin 8.2 (*) 13.0 - 17.0 (g/dL)   HCT 14.7 (*) 82.9 - 52.0 (%)   Dg Chest Port 1 View  02/07/2012  *RADIOLOGY REPORT*  Clinical Data: Chest pain.  Coronary artery disease.  Recent coronary stent placement.  PORTABLE CHEST - 1 VIEW  Comparison: 02/02/2012 and 06/19/2004  Findings: There is chronic cardiomegaly with evidence of prior CABG.  Pulmonary vascularity is normal.  Mild chronic accentuation of the interstitial markings.  No acute infiltrates or effusions.  IMPRESSION: No acute abnormalities.  Chronic cardiomegaly.  Original Report Authenticated By: Gwynn Burly, M.D.     ASSESSMENT and PLAN: 59 YOWM with PMH of CAD s/p recent PCI SVG-PDA (BMS 3x16) on 2/28 who presents with 2 episodes of chest pain this evening.    1: Admit to [facility]  2: Chest pain - his troponins are likely elevated due to his recent MI (as opposed to a new acute event).  It is reassuring that his EKG doesn't show ischemia and he is chest pain free.  Unclear etiology for chest pain - however, ddx includes demand ischemia from anemia, anxiety, GERD; this is unlikely to be a stent thrombosis.  Recommend continuing home medications and cycling cardiac enzymes to ensure that they continue to down trend.  We will go ahead and transfuse him 2 units of blood as his HGB is close to 8 and some of his symptoms may be from anemia.  If he has no further episodes of chest pain and his enzymes downtrend, then he can likely go home tomorrow.   3: Anemia - this is currently being worked up by his PCP.  Continue iron.  Will transfuse 2 units.    4: DM - hold metformin; start SSI  5: FEN: SLIVF, Electrolytes are stable, NPO after midnight.    6: DVT prophylaxis: SCDs.  Bernestine Amass. Mayford Knife, MD

## 2012-02-07 NOTE — ED Notes (Signed)
Troponin results given to Dr. Lynelle Doctor by B. Bing Plume, EMT

## 2012-02-07 NOTE — ED Notes (Signed)
Attempted to call report; Aquilla Hacker. RN states unable to take at this time.

## 2012-02-08 DIAGNOSIS — D649 Anemia, unspecified: Secondary | ICD-10-CM

## 2012-02-08 DIAGNOSIS — R600 Localized edema: Secondary | ICD-10-CM

## 2012-02-08 LAB — TYPE AND SCREEN
ABO/RH(D): O POS
Unit division: 0

## 2012-02-08 LAB — BASIC METABOLIC PANEL
CO2: 24 mEq/L (ref 19–32)
Chloride: 106 mEq/L (ref 96–112)
GFR calc Af Amer: 87 mL/min — ABNORMAL LOW (ref >90)
Sodium: 138 mEq/L (ref 135–145)

## 2012-02-08 LAB — CBC
HCT: 33.5 % — ABNORMAL LOW (ref 39.0–52.0)
MCV: 90.3 fL (ref 78.0–100.0)
Platelets: 104 10*3/uL — ABNORMAL LOW (ref 150–400)
RBC: 3.71 MIL/uL — ABNORMAL LOW (ref 4.22–5.81)
RDW: 18.5 % — ABNORMAL HIGH (ref 11.5–15.5)
WBC: 6.3 10*3/uL (ref 4.0–10.5)

## 2012-02-08 MED ORDER — FUROSEMIDE 40 MG PO TABS
40.0000 mg | ORAL_TABLET | Freq: Every day | ORAL | Status: DC
  Filled 2012-02-08: qty 1

## 2012-02-08 MED ORDER — ISOSORBIDE MONONITRATE ER 30 MG PO TB24: 30.0000 mg | ORAL_TABLET | Freq: Every day | ORAL | Status: DC

## 2012-02-08 MED ORDER — FUROSEMIDE 40 MG PO TABS: 40.0000 mg | ORAL_TABLET | ORAL | Status: DC | PRN

## 2012-02-08 MED ORDER — ISOSORBIDE MONONITRATE ER 30 MG PO TB24
30.0000 mg | ORAL_TABLET | Freq: Every day | ORAL | Status: DC
  Filled 2012-02-08: qty 1

## 2012-02-08 NOTE — Progress Notes (Signed)
    Subjective:  Feels better today. No chest pain or dyspnea overnight. Notes feet are a little swollen.  Objective:  Vital Signs in the last 24 hours: Temp:  [97.2 F (36.2 C)-99.3 F (37.4 C)] 98 F (36.7 C) (03/05 1610) Pulse Rate:  [56-71] 62  (03/05 0638) Resp:  [16-18] 18  (03/05 0638) BP: (118-157)/(64-80) 136/68 mmHg (03/05 0638) SpO2:  [95 %-97 %] 96 % (03/05 0638)  Intake/Output from previous day: 03/04 0701 - 03/05 0700 In: 958.8 [P.O.:240; Blood:718.8] Out: 650 [Urine:650]  Physical Exam: Pt is alert and oriented, elderly male in NAD HEENT: normal Neck: JVP - normal Lungs: CTA bilaterally CV: RRR with 3/6 systolic murmur at the LSB Abd: soft, NT, Positive BS, no hepatomegaly Ext: 1+ bilateral pedal edema, distal pulses intact and equal Skin: warm/dry no rash   Lab Results:  Basename 02/08/12 0615 02/07/12 0044 02/07/12 0019  WBC 6.3 -- 4.8  HGB 10.9* 8.2* --  PLT 104* -- 100*    Basename 02/08/12 0615 02/07/12 0044  NA 138 139  K 4.7 4.4  CL 106 108  CO2 24 --  GLUCOSE 132* 141*  BUN 23 31*  CREATININE 0.95 0.90    Basename 02/07/12 1603 02/07/12 0915  TROPONINI 0.48* 0.57*   Tele: sinus rhythm with PAC's  Assessment/Plan:  1. Chest pain after recent NSTEMI. Low level troponin elevation with flat trend. 2. Anemia - HgB improved after prbc transfusion 3. Pedal edema with other evidence of CHF  Pt is stable this am. Will plan on discharge home today. Recommend add imdur 30 mg daily to his med regimen. Also will give furosemide 40 mg daily PO x 1 for edema, then would use as needed. Followup as scheduled with Tereso Newcomer 3/14. If recurrent chest pain will likely require relook cath.  [name], M.D. 02/08/2012, 7:55 AM

## 2012-02-08 NOTE — Discharge Instructions (Signed)
PLEASE REMEMBER TO BRING ALL OF YOUR MEDICATIONS TO EACH OF YOUR FOLLOW-UP OFFICE VISITS.  YOU HAVE BEEN GIVEN TWO NEW PRESCRIPTIONS. IMDUR 30MG  DAILY AND LASIX 40MG  PRN (AS NEEDED). PLEASE TAKE ONE LASIX TABLET IF YOU NOTICE INCREASED LEG SWELLING OR SUDDEN WEIGHT GAIN (MORE THAN 3 LBS IN ONE DAY OR 5 LBS IN TWO DAYS).

## 2012-02-08 NOTE — Progress Notes (Signed)
Pt concerned re: some bilat foot swelling that he noticed today and asked to be seen by on call physician tonight.  He did get blood transfusion today.  Bilat feet appear a little swollen, but no pitting edema in feet or legs.  Lungs clear and pt has normal resp effort.  At this time, I do not feel that any lasix is warranted.

## 2012-02-08 NOTE — Discharge Summary (Signed)
Discharge Summary   Patient ID: [name],  MRN: [id number], DOB/AGE: 06-02-1927 78 y.o.  Admit date: 02/06/2012 Discharge date: 02/08/2012  Discharge Diagnoses Principal Problem:  *Chest pain at rest Active Problems:  DIABETES MELLITUS, TYPE II  HYPERLIPIDEMIA  HYPERTENSION  Coronary artery disease  Pedal edema  Anemia   Allergies Allergies  Allergen Reactions  . Azithromycin Itching  . Macrodantin Itching  . Minocin Itching  . Minocycline Hcl Itching  . Nitrofurantoin Itching  . Zithromax (Azithromycin Dihydrate) Itching    Diagnostic Studies/Procedures  Portable CXR- 02/07/12  Comparison: 02/02/2012 and 06/19/2004   Findings: There is chronic cardiomegaly with evidence of prior  CABG. Pulmonary vascularity is normal. Mild chronic accentuation  of the interstitial markings. No acute infiltrates or effusions.   IMPRESSION:  No acute abnormalities. Chronic cardiomegaly.  History of Present Illness  [name] is a 78 yo male with PMHx significant for CAD (NSTEMI 02/28 s/p BMS to SVG-PDA; prior history of CABG in 1995), type 2 DM, HTN, HL and anemia (of unknown origin, currently being evaluated by PCP) who was admitted to [facility] hospital with recurrent chest pain.   He was recently discharged from the hospital for PCI in the setting of NSTEMI as noted above. He had been doing well after discharge, and was compliant with his medications. At 8PM the evening prior to admission, he began developing anterior chest pain, nausea and diaphoresis. This was alleviated by NTG SL x 1. A similar episode occurred 2 hours later and EMS was called. He denied orthopnea, PND, LE edema, shortness of breath, cough or syncope. It was noted on admission that he had recently been diagnosed with anemia which was being worked up by his PCP.   Upon presentation to [facility] ED, EKG revealed no acute ischemic changes. POC TnI was elevated at 0.38. CXR revealed no acute abnormalities and stable  cardiomegaly. Cardiac biomarkers returned revealing a mildly elevated TnI at 0.62, with normal CK/CK-MB. This was believed to be persistent troponin elevation from recent, prior NSTEMI, with a low suspicion for cardiac chest pain. He was admitted for observation.   Hospital Course   He was continued on his home meds and started on SSI. H/H returned at 8.2/24.0. He was transfused 2 units pRBC. H/H increased appropriately to 10.9/33.5. His chest pain improved. The evening of admission day #1, he complained of bilateral pedal edema and did endorse an episode of chest pain which was described as more severe. On exam, he was found to have normal respiratory effort, without pulmonary wheezes, rales or rhonchi. He was chest pain free. He was scheduled to receive Lasix 40mg  PO x 1 to alleviate this.  Today, he is stable, at baseline, and will be discharged home. There were no events during his admission and VS remained stable. His chest pain was believed to have been residual post-recent NSTEMI. Repeat cardiac biomarkers returned with mildly elevated troponins with a flat trend and normal CK/CK-MB. There were no EKG changes or events on telemetry suggesting low probability of ACS. Imdur has been added to his cardiac medication regimen. He will also be given a prescription of Lasix PO PRN for increased weight gain or swelling. He will follow-up with previously scheduled [facility] appointment on 03/14. This information has been clearly outlined for him on the AVS.    Discharge Vitals:  Blood pressure 136/68, pulse 62, temperature 98 F (36.7 C), temperature source Oral, resp. rate 18, height 6\' 1"  (1.854 m), weight 87.2 kg (192  lb 3.9 oz), SpO2 96.00%.   Labs: Recent Labs  Basename 02/08/12 0615 02/07/12 0044 02/07/12 0019   WBC 6.3 -- 4.8   HGB 10.9* 8.2* --   HCT 33.5* 24.0* --   MCV 90.3 -- 92.9   PLT 104* -- 100*    Lab 02/08/12 0615 02/07/12 0044 02/04/12 0445 02/02/12 0910 02/02/12 0808  NA 138  139 137 -- --  K 4.7 4.4 4.2 -- --  CL 106 108 103 -- --  CO2 24 -- 24 -- 21  BUN 23 31* 22 -- --  CREATININE 0.95 0.90 1.04 -- --  CALCIUM 10.4 -- 9.8 -- 9.5  PROT -- -- -- 6.1 --  BILITOT -- -- -- 0.5 --  ALKPHOS -- -- -- 104 --  ALT -- -- -- 10 --  AST -- -- -- 20 --  AMYLASE -- -- -- -- --  LIPASE -- -- -- -- --  GLUCOSE 132* 141* 132* -- --    Recent Labs  Basename 02/07/12 1603 02/07/12 0915 02/07/12 0412   CKTOTAL 46 43 49   CKMB 2.8 2.9 2.4   CKMBINDEX -- -- --   TROPONINI 0.48* 0.57* 0.62*    Disposition:  Discharge Orders    Future Appointments: Provider: Department: Dept Phone: Center:   02/17/2012 11:10 AM Beatrice Lecher, PA [facility] [phone] [facility]     Follow-up Information    Follow up with Tereso Newcomer, PA on 02/17/2012. (Please follow-up as previously scheduled.)    Contact information:   [address] [place] [address] [phone]          Discharge Medications:  Medication List  As of 02/08/2012  9:03 AM   START taking these medications         furosemide 40 MG tablet   Commonly known as: LASIX   Take 1 tablet (40 mg total) by mouth as needed (For sudden weight gain or swelling.).      isosorbide mononitrate 30 MG 24 hr tablet   Commonly known as: IMDUR   Take 1 tablet (30 mg total) by mouth daily.         CONTINUE taking these medications         aspirin 81 MG tablet   Take 1 tablet (81 mg total) by mouth daily.      clopidogrel 75 MG tablet   Commonly known as: PLAVIX   Take 1 tablet (75 mg total) by mouth daily with breakfast.      gabapentin 300 MG capsule   Commonly known as: NEURONTIN      HYDROcodone-acetaminophen 5-325 MG per tablet   Commonly known as: NORCO      iron polysaccharides 150 MG capsule   Commonly known as: NIFEREX   Take 1 capsule (150 mg total) by mouth daily.      losartan 100 MG tablet   Commonly known as: COZAAR      metFORMIN 500 MG tablet    Commonly known as: GLUCOPHAGE   Take 1 tablet (500 mg total) by mouth 2 (two) times daily with a meal.      metoprolol succinate 25 MG 24 hr tablet   Commonly known as: TOPROL-XL      nitroGLYCERIN 0.4 MG SL tablet   Commonly known as: NITROSTAT      pravastatin 40 MG tablet   Commonly known as: PRAVACHOL          Where to get your medications  These are the prescriptions that you need to pick up. We sent them to a specific pharmacy, so you will need to go there to get them.   [id number] [place], [place] - [address]    [address] [place] [address]    Phone: [phone]        furosemide 40 MG tablet   isosorbide mononitrate 30 MG 24 hr tablet           Outstanding Labs/Studies: None  Duration of Discharge Encounter: 40 minutes including physician time.  Signed, R. Hurman Horn, PA-C 02/08/2012, 9:03 AM    ]

## 2012-02-08 NOTE — Discharge Summary (Signed)
Agree as above. See my progress note this same date.  [name] 02/08/2012 9:09 PM

## 2012-02-12 ENCOUNTER — Encounter (HOSPITAL_COMMUNITY): Payer: Self-pay | Admitting: *Deleted

## 2012-02-12 ENCOUNTER — Other Ambulatory Visit: Payer: Self-pay

## 2012-02-12 ENCOUNTER — Inpatient Hospital Stay (HOSPITAL_COMMUNITY)
Admission: EM | Admit: 2012-02-12 | Discharge: 2012-02-15 | DRG: 287 | Disposition: A | Discharge status: Home / Self Care | Payer: Medicare Other | Attending: Internal Medicine | Admitting: Internal Medicine

## 2012-02-12 DIAGNOSIS — I2589 Other forms of chronic ischemic heart disease: Secondary | ICD-10-CM | POA: Diagnosis present

## 2012-02-12 DIAGNOSIS — E119 Type 2 diabetes mellitus without complications: Secondary | ICD-10-CM | POA: Diagnosis present

## 2012-02-12 DIAGNOSIS — I252 Old myocardial infarction: Secondary | ICD-10-CM

## 2012-02-12 DIAGNOSIS — N400 Enlarged prostate without lower urinary tract symptoms: Secondary | ICD-10-CM | POA: Diagnosis present

## 2012-02-12 DIAGNOSIS — E785 Hyperlipidemia, unspecified: Secondary | ICD-10-CM | POA: Diagnosis present

## 2012-02-12 DIAGNOSIS — Z87891 Personal history of nicotine dependence: Secondary | ICD-10-CM

## 2012-02-12 DIAGNOSIS — I2582 Chronic total occlusion of coronary artery: Secondary | ICD-10-CM | POA: Diagnosis present

## 2012-02-12 DIAGNOSIS — D696 Thrombocytopenia, unspecified: Secondary | ICD-10-CM | POA: Insufficient documentation

## 2012-02-12 DIAGNOSIS — I359 Nonrheumatic aortic valve disorder, unspecified: Secondary | ICD-10-CM | POA: Diagnosis present

## 2012-02-12 DIAGNOSIS — I679 Cerebrovascular disease, unspecified: Secondary | ICD-10-CM | POA: Insufficient documentation

## 2012-02-12 DIAGNOSIS — D649 Anemia, unspecified: Secondary | ICD-10-CM

## 2012-02-12 DIAGNOSIS — Z79899 Other long term (current) drug therapy: Secondary | ICD-10-CM

## 2012-02-12 DIAGNOSIS — I200 Unstable angina: Secondary | ICD-10-CM

## 2012-02-12 DIAGNOSIS — I249 Acute ischemic heart disease, unspecified: Secondary | ICD-10-CM

## 2012-02-12 DIAGNOSIS — Z7902 Long term (current) use of antithrombotics/antiplatelets: Secondary | ICD-10-CM

## 2012-02-12 DIAGNOSIS — Z951 Presence of aortocoronary bypass graft: Secondary | ICD-10-CM

## 2012-02-12 DIAGNOSIS — Z9861 Coronary angioplasty status: Secondary | ICD-10-CM

## 2012-02-12 DIAGNOSIS — I10 Essential (primary) hypertension: Secondary | ICD-10-CM | POA: Insufficient documentation

## 2012-02-12 DIAGNOSIS — I2510 Atherosclerotic heart disease of native coronary artery without angina pectoris: Principal | ICD-10-CM | POA: Diagnosis present

## 2012-02-12 DIAGNOSIS — J45909 Unspecified asthma, uncomplicated: Secondary | ICD-10-CM | POA: Diagnosis present

## 2012-02-12 DIAGNOSIS — M1990 Unspecified osteoarthritis, unspecified site: Secondary | ICD-10-CM | POA: Diagnosis present

## 2012-02-12 HISTORY — DX: Thrombocytopenia, unspecified: D69.6

## 2012-02-12 MED ORDER — NITROGLYCERIN 0.4 MG SL SUBL: 0.4000 mg | SUBLINGUAL_TABLET | SUBLINGUAL | Status: DC | PRN

## 2012-02-12 MED ORDER — ASPIRIN 325 MG PO TABS: 325.0000 mg | ORAL_TABLET | ORAL | Status: DC

## 2012-02-12 NOTE — ED Notes (Signed)
Pt from home.  Started to have CP at 2045 while watching TV.  Sharp intermittent CP that started on the left and then became diffuse.  Pt took 324 asa, 2 SL nitro with relief.  3/10 pain on EMS arrival.  No N/V/SOB/diaphoresis.  Vitals stable.  124/62.  1SL nitro given en route-pain now a 0/10.  Had a stent placement on March 3rd, 2013 with mild MI during stent placement.  This episode is very similar to the way that the pain was when he had the MI.20ga Lwrist.

## 2012-02-13 ENCOUNTER — Other Ambulatory Visit: Payer: Self-pay

## 2012-02-13 ENCOUNTER — Encounter (HOSPITAL_COMMUNITY): Payer: Self-pay | Admitting: Cardiovascular Disease

## 2012-02-13 DIAGNOSIS — R079 Chest pain, unspecified: Secondary | ICD-10-CM

## 2012-02-13 DIAGNOSIS — I359 Nonrheumatic aortic valve disorder, unspecified: Secondary | ICD-10-CM

## 2012-02-13 DIAGNOSIS — I249 Acute ischemic heart disease, unspecified: Secondary | ICD-10-CM | POA: Insufficient documentation

## 2012-02-13 LAB — DIFFERENTIAL
Basophils Relative: 1 % (ref 0–1)
Eosinophils Relative: 3 % (ref 0–5)
Lymphocytes Relative: 22 % (ref 12–46)
Monocytes Absolute: 0.3 10*3/uL (ref 0.1–1.0)
Monocytes Relative: 10 % (ref 3–12)
Neutrophils Relative %: 64 % (ref 43–77)

## 2012-02-13 LAB — BASIC METABOLIC PANEL
BUN: 29 mg/dL — ABNORMAL HIGH (ref 6–23)
Chloride: 103 mEq/L (ref 96–112)
Creatinine, Ser: 1.13 mg/dL (ref 0.50–1.35)
GFR calc Af Amer: 68 mL/min — ABNORMAL LOW (ref >90)
GFR calc non Af Amer: 59 mL/min — ABNORMAL LOW (ref >90)

## 2012-02-13 LAB — CBC
HCT: 30.6 % — ABNORMAL LOW (ref 39.0–52.0)
MCHC: 32.4 g/dL (ref 30.0–36.0)
MCV: 91.1 fL (ref 78.0–100.0)
Platelets: 92 10*3/uL — ABNORMAL LOW (ref 150–400)
RDW: 17.7 % — ABNORMAL HIGH (ref 11.5–15.5)
WBC: 3.7 10*3/uL — ABNORMAL LOW (ref 4.0–10.5)

## 2012-02-13 LAB — PRO B NATRIURETIC PEPTIDE: Pro B Natriuretic peptide (BNP): 2361 pg/mL — ABNORMAL HIGH (ref 0–450)

## 2012-02-13 LAB — CARDIAC PANEL(CRET KIN+CKTOT+MB+TROPI)
CK, MB: 1.5 ng/mL (ref 0.3–4.0)
CK, MB: 1.4 ng/mL (ref 0.3–4.0)
Total CK: 23 U/L (ref 7–232)
Troponin I: <0.3 ng/mL (ref <0.30)

## 2012-02-13 MED ORDER — ACETAMINOPHEN 500 MG PO TABS
1000.0000 mg | ORAL_TABLET | Freq: Once | ORAL | Status: AC
  Administered 2012-02-13: 1000 mg via ORAL
  Filled 2012-02-13: qty 2

## 2012-02-13 MED ORDER — LOSARTAN POTASSIUM 50 MG PO TABS
100.0000 mg | ORAL_TABLET | Freq: Every day | ORAL | Status: DC
  Administered 2012-02-13 – 2012-02-15 (×3): 100 mg via ORAL
  Filled 2012-02-13 (×3): qty 2

## 2012-02-13 MED ORDER — GABAPENTIN 300 MG PO CAPS
300.0000 mg | ORAL_CAPSULE | Freq: Three times a day (TID) | ORAL | Status: DC
  Administered 2012-02-13 – 2012-02-15 (×7): 300 mg via ORAL
  Filled 2012-02-13 (×9): qty 1

## 2012-02-13 MED ORDER — HEPARIN BOLUS VIA INFUSION
4000.0000 [IU] | Freq: Once | INTRAVENOUS | Status: AC
  Administered 2012-02-13: 4000 [IU] via INTRAVENOUS

## 2012-02-13 MED ORDER — SODIUM CHLORIDE 0.9 % IJ SOLN
3.0000 mL | Freq: Two times a day (BID) | INTRAMUSCULAR | Status: DC
  Administered 2012-02-13: 3 mL via INTRAVENOUS

## 2012-02-13 MED ORDER — HYDROCODONE-ACETAMINOPHEN 5-325 MG PO TABS
1.0000 | ORAL_TABLET | Freq: Three times a day (TID) | ORAL | Status: DC | PRN
  Administered 2012-02-13 – 2012-02-15 (×4): 1 via ORAL
  Filled 2012-02-13 (×5): qty 1

## 2012-02-13 MED ORDER — ATORVASTATIN CALCIUM 80 MG PO TABS
80.0000 mg | ORAL_TABLET | Freq: Every day | ORAL | Status: DC
  Administered 2012-02-13: 80 mg via ORAL
  Filled 2012-02-13 (×4): qty 1

## 2012-02-13 MED ORDER — METOPROLOL SUCCINATE ER 25 MG PO TB24
25.0000 mg | ORAL_TABLET | Freq: Every day | ORAL | Status: DC
  Administered 2012-02-13 – 2012-02-15 (×3): 25 mg via ORAL
  Filled 2012-02-13 (×3): qty 1

## 2012-02-13 MED ORDER — HEPARIN (PORCINE) IN NACL 100-0.45 UNIT/ML-% IJ SOLN
1300.0000 [IU]/h | INTRAMUSCULAR | Status: DC
  Administered 2012-02-13: 1000 [IU]/h via INTRAVENOUS
  Administered 2012-02-14: 1300 [IU]/h via INTRAVENOUS
  Filled 2012-02-13 (×4): qty 250

## 2012-02-13 MED ORDER — SODIUM CHLORIDE 0.9 % IV SOLN
250.0000 mL | INTRAVENOUS | Status: DC | PRN
  Administered 2012-02-13: 250 mL via INTRAVENOUS

## 2012-02-13 MED ORDER — ASPIRIN EC 325 MG PO TBEC
325.0000 mg | DELAYED_RELEASE_TABLET | Freq: Every day | ORAL | Status: DC
  Administered 2012-02-13 – 2012-02-15 (×3): 325 mg via ORAL
  Filled 2012-02-13 (×3): qty 1

## 2012-02-13 MED ORDER — CLOPIDOGREL BISULFATE 75 MG PO TABS
75.0000 mg | ORAL_TABLET | Freq: Every day | ORAL | Status: DC
  Administered 2012-02-14 – 2012-02-15 (×2): 75 mg via ORAL
  Filled 2012-02-13 (×2): qty 1

## 2012-02-13 MED ORDER — ISOSORBIDE MONONITRATE ER 30 MG PO TB24
30.0000 mg | ORAL_TABLET | Freq: Every day | ORAL | Status: DC
  Administered 2012-02-13 – 2012-02-15 (×3): 30 mg via ORAL
  Filled 2012-02-13 (×3): qty 1

## 2012-02-13 MED ORDER — ACETAMINOPHEN 325 MG PO TABS: 650.0000 mg | ORAL_TABLET | ORAL | Status: DC | PRN

## 2012-02-13 MED ORDER — SODIUM CHLORIDE 0.9 % IJ SOLN: 3.0000 mL | INTRAMUSCULAR | Status: DC | PRN

## 2012-02-13 MED ORDER — CLOPIDOGREL BISULFATE 300 MG PO TABS
600.0000 mg | ORAL_TABLET | Freq: Once | ORAL | Status: AC
  Administered 2012-02-13: 600 mg via ORAL
  Filled 2012-02-13: qty 2

## 2012-02-13 MED ORDER — ONDANSETRON HCL 4 MG/2ML IJ SOLN: 4.0000 mg | Freq: Four times a day (QID) | INTRAMUSCULAR | Status: DC | PRN

## 2012-02-13 MED ORDER — POLYSACCHARIDE IRON COMPLEX 150 MG PO CAPS
150.0000 mg | ORAL_CAPSULE | Freq: Every day | ORAL | Status: DC
  Administered 2012-02-13 – 2012-02-15 (×3): 150 mg via ORAL
  Filled 2012-02-13 (×3): qty 1

## 2012-02-13 MED ORDER — FUROSEMIDE 40 MG PO TABS
40.0000 mg | ORAL_TABLET | Freq: Every day | ORAL | Status: DC | PRN
  Filled 2012-02-13: qty 2

## 2012-02-13 NOTE — Progress Notes (Signed)
ANTICOAGULATION CONSULT NOTE - Follow Up Consult  Pharmacy Consult for Heparin Indication: chest pain/ACS  Assessment: 78yo with history of CAD - CABG and PCI with BMS on 02/03/2012. He now presents with chest pain and is on IV heparin until cardiac work up is complete. Patient has some thrombocytopenia - discussed giving full dose Plavix/Aspirin with PA, plan is to continue and observe for s/s of bleeding.  HL (drawn about an hour early) is at 0.30 (goal 0.3-0.7) in setting of low plt count and concurrent medications will continue at current dose.   Goal of Therapy:  Heparin level 0.3-0.7 units/ml   Plan:  1. Continue heparin at 1000 units/hr (23ml/hr) 2. F/u AM heparin level  Thank you,  Brett Fairy, PharmD Pager: [phone]  02/13/2012 3:20 PM   Allergies  Allergen Reactions  . Azithromycin Itching  . Macrodantin Itching  . Minocin Itching  . Minocycline Hcl Itching  . Nitrofurantoin Itching  . Zithromax (Azithromycin Dihydrate) Itching    Patient Measurements: Height: 6\' 1"  (185.4 cm) Weight: 185 lb (83.915 kg) IBW/kg (Calculated) : 79.9   Vital Signs: Temp: 97.4 F (36.3 C) (03/10 1200) Temp src: Oral (03/10 1200) BP: 117/46 mmHg (03/10 0758) Pulse Rate: 64  (03/10 0758)  Labs:  Basename 02/13/12 1312 02/13/12 0743 02/13/12 0002  HGB -- -- 9.9*  HCT -- -- 30.6*  PLT -- -- 92*  APTT -- -- --  LABPROT -- -- --  INR -- -- --  HEPARINUNFRC 0.30 -- --  CREATININE -- -- 1.13  CKTOTAL 26 25 --  CKMB 1.5 1.4 --  TROPONINI <0.30 <0.30 --   Estimated Creatinine Clearance: 57 ml/min (by C-G formula based on Cr of 1.13).   Medications:  Scheduled:    . acetaminophen  1,000 mg Oral Once  . aspirin EC  325 mg Oral Daily  . atorvastatin  80 mg Oral q1800  . clopidogrel  600 mg Oral Once  . clopidogrel  75 mg Oral Q breakfast  . gabapentin  300 mg Oral TID  . heparin  4,000 Units Intravenous Once  . iron polysaccharides  150 mg Oral Daily  . isosorbide  mononitrate  30 mg Oral Daily  . losartan  100 mg Oral Daily  . metoprolol succinate  25 mg Oral Daily  . sodium chloride  3 mL Intravenous Q12H  . DISCONTD: aspirin  325 mg Oral STAT   Infusions:    . heparin 1,000 Units/hr (02/13/12 1011)   PRN: sodium chloride, acetaminophen, furosemide, HYDROcodone-acetaminophen, nitroGLYCERIN, ondansetron (ZOFRAN) IV, sodium chloride   [name] N 02/13/2012,3:18 PM

## 2012-02-13 NOTE — ED Notes (Signed)
Pt states that he was seen at the end of Feb. 27 for CP and had a Stent placed. Pt was discharged and last Sunday he started having CP and came to the E.D. Pt today took 2 Nitrostats for CP that he described as starting under his left side and wrapped around to mid sternal and was sharp. Pt states that the EMS nitro relieved his pain and his is currently pain free except the HA the nitro gave him. Pt is alert and oriented able to follow commands and move extremities.

## 2012-02-13 NOTE — H&P (Signed)
[name] is an 78 y.o. male with a history of CAD s/p remote CABG and recent PCI, moderate aortic stenosis, cardiomyopathy EF 40-45%, DM, HTN and hyperlipidemia. Chief Complaint: Chest pain HPI: Patient with the above history of a remote CABG (1995) with recent onset of chest pain which was treated with PCI with BMS to the SVG supplying the RPDA  (02/03/2012). Since PCI, he had an admission for chest pain during which troponin rose to 0.6 . Given the fact that his hemoglobin was low (8.4), he was treated by transfusion and medical management. He returns now with left sided chest pain similar to what he had prior to bypass and PCI. Pain was 9/10, radiated down left arm and was relieved by NTG such that by the time EMS got to his house, he was pain free. He denies diaphoresis, nausea or vomiting. His exercise tolerance is unchanged, he has no leg edema, no orthopnea and no PND. EKG in ER did not reveal any ST segment change and troponin was negative.   Past Medical History  Diagnosis Date  . CAD (coronary artery disease)     s/p CABG 1995. NSTEMI & subsequent BMS to SVG->right PDA 02/03/12. EF 40-45% by echo 02/02/12.  Marland Kitchen Cerebrovascular disease, unspecified   . Unspecified essential hypertension   . Other and unspecified hyperlipidemia   . Type II or unspecified type diabetes mellitus without mention of complication, not stated as uncontrolled   . Osteoarthritis (arthritis due to wear and tear of joints)   . Asthmatic bronchitis   . BPH (benign prostatic hypertrophy)   . Herpes zoster   . Arthritis   . Anemia     Past Surgical History  Procedure Date  . Coronary artery bypass graft 1995  . Rotator cuff repair   . Carpal tunnel release   . Incisional hernia repair   . Kidney surgery   . Back surgery     Family History  Problem Relation Age of Onset  . Coronary artery disease    . Alcohol abuse     Social History:  reports that he has quit smoking. He has never used smokeless  tobacco. He reports that he does not drink alcohol or use illicit drugs.  Allergies:  Allergies  Allergen Reactions  . Azithromycin Itching  . Macrodantin Itching  . Minocin Itching  . Minocycline Hcl Itching  . Nitrofurantoin Itching  . Zithromax (Azithromycin Dihydrate) Itching    Medications Prior to Admission  Medication Dose Route Frequency Provider Last Rate Last Dose  . acetaminophen (TYLENOL) tablet 1,000 mg  1,000 mg Oral Once Doug Sou, MD   1,000 mg at 02/13/12 0116  . nitroGLYCERIN (NITROSTAT) SL tablet 0.4 mg  0.4 mg Sublingual Q5 min PRN Doug Sou, MD      . DISCONTD: aspirin tablet 325 mg  325 mg Oral STAT Doug Sou, MD       Medications Prior to Admission  Medication Sig Dispense Refill  . clopidogrel (PLAVIX) 75 MG tablet Take 75 mg by mouth daily with breakfast.      . furosemide (LASIX) 40 MG tablet Take 40 mg by mouth as needed.      . gabapentin (NEURONTIN) 300 MG capsule Take 300 mg by mouth 3 (three) times daily. For shingles pain      . HYDROcodone-acetaminophen (NORCO) 5-325 MG per tablet Take 1 tablet by mouth 3 (three) times daily as needed. Pt stated he tries to take as scheduled for shingles pain      .  iron polysaccharides (NIFEREX) 150 MG capsule Take 150 mg by mouth daily.      . isosorbide mononitrate (IMDUR) 30 MG 24 hr tablet Take 30 mg by mouth daily.      Marland Kitchen losartan (COZAAR) 100 MG tablet Take 1 tablet by mouth Daily.      . metFORMIN (GLUCOPHAGE) 500 MG tablet Take 500 mg by mouth 2 (two) times daily with a meal.      . metoprolol succinate (TOPROL-XL) 25 MG 24 hr tablet Take 25 mg by mouth daily.       . nitroGLYCERIN (NITROSTAT) 0.4 MG SL tablet Place 0.4 mg under the tongue every 5 (five) minutes as needed. For chest pain.      . pravastatin (PRAVACHOL) 40 MG tablet Take 40 mg by mouth daily.        Results for orders placed during the hospital encounter of 02/12/12 (from the past 48 hour(s))  CBC     Status: Abnormal    Collection Time   02/13/12 12:02 AM      Component Value Range Comment   WBC 3.7 (*) 4.0 - 10.5 (K/uL)    RBC 3.36 (*) 4.22 - 5.81 (MIL/uL)    Hemoglobin 9.9 (*) 13.0 - 17.0 (g/dL)    HCT 16.1 (*) 09.6 - 52.0 (%)    MCV 91.1  78.0 - 100.0 (fL)    MCH 29.5  26.0 - 34.0 (pg)    MCHC 32.4  30.0 - 36.0 (g/dL)    RDW 04.5 (*) 40.9 - 15.5 (%)    Platelets 92 (*) 150 - 400 (K/uL) CONSISTENT WITH PREVIOUS RESULT  BASIC METABOLIC PANEL     Status: Abnormal   Collection Time   02/13/12 12:02 AM      Component Value Range Comment   Sodium 139  135 - 145 (mEq/L)    Potassium 4.0  3.5 - 5.1 (mEq/L)    Chloride 103  96 - 112 (mEq/L)    CO2 24  19 - 32 (mEq/L)    Glucose, Bld 161 (*) 70 - 99 (mg/dL)    BUN 29 (*) 6 - 23 (mg/dL)    Creatinine, Ser 8.11  0.50 - 1.35 (mg/dL)    Calcium 9.5  8.4 - 10.5 (mg/dL)    GFR calc non Af Amer 59 (*) >90 (mL/min)    GFR calc Af Amer 68 (*) >90 (mL/min)   PRO B NATRIURETIC PEPTIDE     Status: Abnormal   Collection Time   02/13/12 12:03 AM      Component Value Range Comment   Pro B Natriuretic peptide (BNP) 2361.0 (*) 0 - 450 (pg/mL)   DIFFERENTIAL     Status: Normal   Collection Time   02/13/12 12:42 AM      Component Value Range Comment   Neutro Abs 2.3  1.7 - 7.7 (K/uL)    Lymphs Abs 0.8  0.7 - 4.0 (K/uL)    Monocytes Absolute 0.3  0.1 - 1.0 (K/uL)    Eosinophils Absolute 0.1  0.0 - 0.7 (K/uL)    Basophils Absolute 0.0  0.0 - 0.1 (K/uL)    Neutrophils Relative 64  43 - 77 (%)    Lymphocytes Relative 22  12 - 46 (%)    Monocytes Relative 10  3 - 12 (%)    Eosinophils Relative 3  0 - 5 (%)    Basophils Relative 1  0 - 1 (%)    RBC Morphology POLYCHROMASIA PRESENT  WBC Morphology MILD LEFT SHIFT (1-5% METAS, OCC MYELO, OCC BANDS)     POCT I-STAT TROPONIN I     Status: Normal   Collection Time   02/13/12 12:52 AM      Component Value Range Comment   Troponin i, poc 0.07  0.00 - 0.08 (ng/mL)    Comment 3             No results found.  Review of  Systems  Constitutional: Negative.   HENT: Negative.   Eyes: Negative.   Respiratory: Negative.   Gastrointestinal: Negative.   Genitourinary: Negative.   Musculoskeletal: Negative.   Skin: Negative.   Neurological: Negative.   Endo/Heme/Allergies:       Anemia   Psychiatric/Behavioral: Negative.     Blood pressure 110/53, pulse 56, temperature 98.1 F (36.7 C), temperature source Oral, resp. rate 13, SpO2 97.00%. Physical Exam  Constitutional: He is oriented to person, place, and time. No distress.  HENT:  Head: Normocephalic.  Mouth/Throat: No oropharyngeal exudate.  Eyes: Conjunctivae are normal. No scleral icterus.  Neck: No JVD present.  Cardiovascular: S1 normal and S2 normal.  Exam reveals no distant heart sounds and no friction rub.   Murmur heard.  Crescendo systolic murmur is present with a grade of 3/6    Respiratory: Effort normal and breath sounds normal. No respiratory distress. He has no wheezes. He has no rales. He exhibits no tenderness.  GI: Soft. Bowel sounds are normal.  Musculoskeletal: He exhibits no edema and no tenderness.  Neurological: He is alert and oriented to person, place, and time. He has normal reflexes.  Skin: Skin is warm and dry. He is not diaphoretic.  Psychiatric: He has a normal mood and affect.     Assessment/Plan 1) Unstable angina in patient with previous CABG and recent PCI to SVG. 2) Moderate AS 3) Mildly depressed EF from ischemic cardiomyopathy 4) HTN 5) DM 6) Hyperlipidemia  Elderly man Ischemic cardiomyopathy and moderate aortic stenosis who recently had PCI to a prior SVG graft and is now having recurrent chest pain that is typical for his ischemia.  Admit to step down unit Serial cardiac markers Serial EKGS ASA 325mg  daily Plavix 600mg  now then 75mg  daily Heparin drip Statin BB Consider repeat 2D echo possibly with dobutamine to rule out low flow AS. ACEI Nuclear stress test    Grandville Silos 02/13/2012, 6:49 AM

## 2012-02-13 NOTE — Progress Notes (Signed)
ANTICOAGULATION CONSULT NOTE - Initial Consult  Pharmacy Consult for  Heparin Indication:   Chest Pain  Allergies  Allergen Reactions  . Azithromycin Itching  . Macrodantin Itching  . Minocin Itching  . Minocycline Hcl Itching  . Nitrofurantoin Itching  . Zithromax (Azithromycin Dihydrate) Itching    Patient Measurements:   Heparin Dosing Weight: 83.9kg  Vital Signs: Temp: 97.8 F (36.6 C) (03/10 0721) Temp src: Oral (03/10 0721) BP: 118/45 mmHg (03/10 0721) Pulse Rate: 66  (03/10 0721)  Labs:  Basename 02/13/12 0002  HGB 9.9*  HCT 30.6*  PLT 92*  APTT --  LABPROT --  INR --  HEPARINUNFRC --  CREATININE 1.13  CKTOTAL --  CKMB --  TROPONINI --   The CrCl is unknown because both a height and weight (above a minimum accepted value) are required for this calculation.  Medical History: Past Medical History  Diagnosis Date  . CAD (coronary artery disease)     s/p CABG 1995. NSTEMI & subsequent BMS to SVG->right PDA 02/03/12. EF 40-45% by echo 02/02/12.  Marland Kitchen Cerebrovascular disease, unspecified   . Unspecified essential hypertension   . Other and unspecified hyperlipidemia   . Type II or unspecified type diabetes mellitus without mention of complication, not stated as uncontrolled   . Osteoarthritis (arthritis due to wear and tear of joints)   . Asthmatic bronchitis   . BPH (benign prostatic hypertrophy)   . Herpes zoster   . Arthritis   . Anemia     Medications:  Aspirin 81 mg daily Clopidogrel 75 mg daily Furosemide 40 mg as needed Gabapentin 300 mg three times daily Hydrocodone-Acetaminophen 5-325 mg three times daily as needed Iron Polysaccharides 150 mg daily Isosorbide Mononitrate 30 mg daily Losartan 100 mg daily Metformin 500 mg two times daily with a meal Metoprolol Succinate 25 mg daily Nitroglycerin 0.4 mg prn Pravastatin 40 mg daily  Assessment: 78yo with history of CAD - CABG and PCI with BMS on 02/03/2012.  He now presents with chest pain.   The plan is to start him on IV heparin until cardiac work up is complete.  Patient has some thrombocytopenia - discussed giving full dose Plavix/Aspirin with PA, plan is to continue and observe for s/s of bleeding.  Goal of Therapy:  Heparin level 0.3-0.7 units/ml   Plan:  1.  Begin IV heparin at 1000 units/hr 2.  Give Heparin 4000 units IV bolus 3.  Obtain heparin level 6 hours after start 4.  Daily heparin level/CBC  Nadara Mustard, PharmD., MS Clinical Pharmacist Pager:  [phone] 02/13/2012,7:45 AM

## 2012-02-13 NOTE — ED Notes (Signed)
Pharmacy called and informed RN that she is going to verify with the admitting physician if he is aware that the patient's platelet is low before the Heparin gtt and Plavix will be released.

## 2012-02-13 NOTE — Progress Notes (Signed)
  Echocardiogram 2D Echocardiogram has been performed.  Sherin Murdoch, Real Cons 02/13/2012, 2:35 PM

## 2012-02-13 NOTE — ED Notes (Signed)
Assumed patient's care, Pt A/A/Ox4, skin warm and dry, respiration is even and unlabored. NSB on the monitor. Pt denies any chest pain at present

## 2012-02-13 NOTE — ED Provider Notes (Cosign Needed)
History     CSN: [id number]  Arrival date & time 02/12/12  2341   First MD Initiated Contact with Patient 02/13/12 0030      Chief Complaint  Patient presents with  . Chest Pain    (Consider location/radiation/quality/duration/timing/severity/associated sxs/prior treatment) HPI Complains of anterior chest pain onset 9 p.m. tonight lasting 5 minutes feels like prior heart pain pressure-like no associated shortness of breath nausea or sweatiness treated himself with aspirin 325 mg and 2 sublingual nitroglycerin with complete relief of pain he is presently asymptomatic. No other associated symptoms, other than headache since treatment with nitroglycerin Past Medical History  Diagnosis Date  . CAD (coronary artery disease)     s/p CABG 1995. NSTEMI & subsequent BMS to SVG->right PDA 02/03/12. EF 40-45% by echo 02/02/12.  Marland Kitchen Cerebrovascular disease, unspecified   . Unspecified essential hypertension   . Other and unspecified hyperlipidemia   . Type II or unspecified type diabetes mellitus without mention of complication, not stated as uncontrolled   . Osteoarthritis (arthritis due to wear and tear of joints)   . Asthmatic bronchitis   . BPH (benign prostatic hypertrophy)   . Herpes zoster   . Arthritis   . Anemia     Past Surgical History  Procedure Date  . Coronary artery bypass graft 1995  . Rotator cuff repair   . Carpal tunnel release   . Incisional hernia repair   . Kidney surgery   . Back surgery     Family History  Problem Relation Age of Onset  . Coronary artery disease    . Alcohol abuse      History  Substance Use Topics  . Smoking status: Former Games developer  . Smokeless tobacco: Never Used   Comment: quit in 1986  . Alcohol Use: No      Review of Systems  Constitutional: Negative.   HENT: Negative.   Respiratory: Negative.   Cardiovascular: Positive for chest pain.  Gastrointestinal: Negative.   Musculoskeletal: Negative.   Skin: Negative.     Neurological: Negative.   Hematological: Negative.   Psychiatric/Behavioral: Negative.   All other systems reviewed and are negative.    Allergies  Azithromycin; Macrodantin; Minocin; Minocycline hcl; Nitrofurantoin; and Zithromax  Home Medications   Current Outpatient Rx  Name Route Sig Dispense Refill  . ASPIRIN EC 81 MG PO TBEC Oral Take 81 mg by mouth daily.    Marland Kitchen CLOPIDOGREL BISULFATE 75 MG PO TABS Oral Take 75 mg by mouth daily with breakfast.    . FUROSEMIDE 40 MG PO TABS Oral Take 40 mg by mouth as needed.    Marland Kitchen GABAPENTIN 300 MG PO CAPS Oral Take 300 mg by mouth 3 (three) times daily. For shingles pain    . HYDROCODONE-ACETAMINOPHEN 5-325 MG PO TABS Oral Take 1 tablet by mouth 3 (three) times daily as needed. Pt stated he tries to take as scheduled for shingles pain    . POLYSACCHARIDE IRON COMPLEX 150 MG PO CAPS Oral Take 150 mg by mouth daily.    . ISOSORBIDE MONONITRATE ER 30 MG PO TB24 Oral Take 30 mg by mouth daily.    Marland Kitchen LOSARTAN POTASSIUM 100 MG PO TABS Oral Take 1 tablet by mouth Daily.    Marland Kitchen METFORMIN HCL 500 MG PO TABS Oral Take 500 mg by mouth 2 (two) times daily with a meal.    . METOPROLOL SUCCINATE ER 25 MG PO TB24 Oral Take 25 mg by mouth daily.     Marland Kitchen  NITROGLYCERIN 0.4 MG SL SUBL Sublingual Place 0.4 mg under the tongue every 5 (five) minutes as needed. For chest pain.    Marland Kitchen PRAVASTATIN SODIUM 40 MG PO TABS Oral Take 40 mg by mouth daily.      BP 128/53  Pulse 74  Temp(Src) 98.1 F (36.7 C) (Oral)  Resp 16  SpO2 95%  Physical Exam  Nursing note and vitals reviewed. Constitutional: He appears well-developed and well-nourished.  HENT:  Head: Normocephalic and atraumatic.  Eyes: Conjunctivae are normal. Pupils are equal, round, and reactive to light.  Neck: Neck supple. No tracheal deviation present. No thyromegaly present.  Cardiovascular: Normal rate and regular rhythm.   No murmur heard. Pulmonary/Chest: Effort normal and breath sounds normal.   Abdominal: Soft. Bowel sounds are normal. He exhibits no distension. There is no tenderness.  Musculoskeletal: Normal range of motion. He exhibits no edema and no tenderness.  Neurological: He is alert. Coordination normal.  Skin: Skin is warm and dry. No rash noted.  Psychiatric: He has a normal mood and affect.    ED Course  Procedures (including critical care time)  Date: 02/13/2012  Rate: 75  Rhythm: normal sinus rhythm  QRS Axis: left  Intervals: normal  ST/T Wave abnormalities: nonspecific T wave changes  Conduction Disutrbances:none  Narrative Interpretation:   Old EKG Reviewed: changes noted  PVCs and sinus bradycardia from 02/07/2012 has resolved Labs Reviewed  CBC - Abnormal; Notable for the following:    WBC 3.7 (*)    RBC 3.36 (*)    Hemoglobin 9.9 (*)    HCT 30.6 (*)    RDW 17.7 (*)    Platelets 92 (*) CONSISTENT WITH PREVIOUS RESULT   All other components within normal limits  BASIC METABOLIC PANEL  PRO B NATRIURETIC PEPTIDE  DIFFERENTIAL   No results found.  Spoke with [facility] cardiology who come to evaluate patient for disposition and possible admission No diagnosis found. Results for orders placed during the hospital encounter of 02/12/12  CBC      Component Value Range   WBC 3.7 (*) 4.0 - 10.5 (K/uL)   RBC 3.36 (*) 4.22 - 5.81 (MIL/uL)   Hemoglobin 9.9 (*) 13.0 - 17.0 (g/dL)   HCT 16.1 (*) 09.6 - 52.0 (%)   MCV 91.1  78.0 - 100.0 (fL)   MCH 29.5  26.0 - 34.0 (pg)   MCHC 32.4  30.0 - 36.0 (g/dL)   RDW 04.5 (*) 40.9 - 15.5 (%)   Platelets 92 (*) 150 - 400 (K/uL)  BASIC METABOLIC PANEL      Component Value Range   Sodium 139  135 - 145 (mEq/L)   Potassium 4.0  3.5 - 5.1 (mEq/L)   Chloride 103  96 - 112 (mEq/L)   CO2 24  19 - 32 (mEq/L)   Glucose, Bld 161 (*) 70 - 99 (mg/dL)   BUN 29 (*) 6 - 23 (mg/dL)   Creatinine, Ser 8.11  0.50 - 1.35 (mg/dL)   Calcium 9.5  8.4 - 91.4 (mg/dL)   GFR calc non Af Amer 59 (*) >90 (mL/min)   GFR calc Af Amer  68 (*) >90 (mL/min)  PRO B NATRIURETIC PEPTIDE      Component Value Range   Pro B Natriuretic peptide (BNP) 2361.0 (*) 0 - 450 (pg/mL)  DIFFERENTIAL      Component Value Range   Neutro Abs PENDING  1.7 - 7.7 (K/uL)   Lymphs Abs PENDING  0.7 - 4.0 (K/uL)   Monocytes  Absolute PENDING  0.1 - 1.0 (K/uL)   Eosinophils Absolute PENDING  0.0 - 0.7 (K/uL)   Basophils Absolute PENDING  0.0 - 0.1 (K/uL)   Neutrophils Relative 64  43 - 77 (%)   Lymphocytes Relative 22  12 - 46 (%)   Monocytes Relative 10  3 - 12 (%)   Eosinophils Relative 3  0 - 5 (%)   Basophils Relative 1  0 - 1 (%)   RBC Morphology POLYCHROMASIA PRESENT     WBC Morphology MILD LEFT SHIFT (1-5% METAS, OCC MYELO, OCC BANDS)    POCT I-STAT TROPONIN I      Component Value Range   Troponin i, poc 0.07  0.00 - 0.08 (ng/mL)   Comment 3            Dg Chest Port 1 View  02/07/2012  *RADIOLOGY REPORT*  Clinical Data: Chest pain.  Coronary artery disease.  Recent coronary stent placement.  PORTABLE CHEST - 1 VIEW  Comparison: 02/02/2012 and 06/19/2004  Findings: There is chronic cardiomegaly with evidence of prior CABG.  Pulmonary vascularity is normal.  Mild chronic accentuation of the interstitial markings.  No acute infiltrates or effusions.  IMPRESSION: No acute abnormalities.  Chronic cardiomegaly.  Original Report Authenticated By: Gwynn Burly, M.D.   Dg Chest Portable 1 View  02/02/2012  *RADIOLOGY REPORT*  Clinical Data: Shortness of breath, chest pain  PORTABLE CHEST - 1 VIEW  Comparison: Chest x-ray of 06/19/2004  Findings: Mild basilar atelectasis is present.  Moderate cardiomegaly is stable.  Median sternotomy sutures are noted from prior CABG. No bony abnormality is seen.  IMPRESSION: Stable cardiomegaly.  No active process.  Minimal basilar atelectasis.  Original Report Authenticated By: Juline Patch, M.D.      MDM  Suspect unstable angina based on patient's symptoms and relief with nitroglycerin Cardiology service  to make final disposition Diagnoses unstable angina        Doug Sou, MD 02/13/12 [id number]

## 2012-02-14 ENCOUNTER — Encounter (HOSPITAL_COMMUNITY): Payer: Self-pay | Admitting: Nurse Practitioner

## 2012-02-14 ENCOUNTER — Encounter (HOSPITAL_COMMUNITY)
Admission: EM | Disposition: A | Discharge status: Home / Self Care | Payer: Self-pay | Source: Home / Self Care | Attending: Internal Medicine

## 2012-02-14 DIAGNOSIS — I2510 Atherosclerotic heart disease of native coronary artery without angina pectoris: Secondary | ICD-10-CM

## 2012-02-14 DIAGNOSIS — I200 Unstable angina: Secondary | ICD-10-CM

## 2012-02-14 DIAGNOSIS — D696 Thrombocytopenia, unspecified: Secondary | ICD-10-CM | POA: Insufficient documentation

## 2012-02-14 HISTORY — PX: LEFT HEART CATHETERIZATION WITH CORONARY/GRAFT ANGIOGRAM: SHX5450

## 2012-02-14 LAB — PROTIME-INR
INR: 1.13 (ref 0.00–1.49)
Prothrombin Time: 14.7 seconds (ref 11.6–15.2)

## 2012-02-14 LAB — GLUCOSE, CAPILLARY: Glucose-Capillary: 93 mg/dL (ref 70–99)

## 2012-02-14 SURGERY — LEFT HEART CATHETERIZATION WITH CORONARY/GRAFT ANGIOGRAM
Anesthesia: LOCAL

## 2012-02-14 MED ORDER — ASPIRIN 81 MG PO CHEW
324.0000 mg | CHEWABLE_TABLET | ORAL | Status: AC
  Administered 2012-02-14: 324 mg via ORAL

## 2012-02-14 MED ORDER — SODIUM CHLORIDE 0.9 % IV SOLN
1.0000 mL/kg/h | INTRAVENOUS | Status: DC
  Administered 2012-02-14: 1 mL/kg/h via INTRAVENOUS

## 2012-02-14 MED ORDER — HEPARIN BOLUS VIA INFUSION
1500.0000 [IU] | Freq: Once | INTRAVENOUS | Status: AC
  Administered 2012-02-14: 1500 [IU] via INTRAVENOUS
  Filled 2012-02-14: qty 1500

## 2012-02-14 MED ORDER — HEPARIN (PORCINE) IN NACL 2-0.9 UNIT/ML-% IJ SOLN
INTRAMUSCULAR | Status: AC
  Filled 2012-02-14: qty 2000

## 2012-02-14 MED ORDER — ONDANSETRON HCL 4 MG/2ML IJ SOLN: 4.0000 mg | Freq: Four times a day (QID) | INTRAMUSCULAR | Status: DC | PRN

## 2012-02-14 MED ORDER — FENTANYL CITRATE 0.05 MG/ML IJ SOLN
INTRAMUSCULAR | Status: AC
  Filled 2012-02-14: qty 2

## 2012-02-14 MED ORDER — SODIUM CHLORIDE 0.9 % IV SOLN: INTRAVENOUS | Status: DC

## 2012-02-14 MED ORDER — NITROGLYCERIN 0.2 MG/ML ON CALL CATH LAB
INTRAVENOUS | Status: AC
  Filled 2012-02-14: qty 1

## 2012-02-14 MED ORDER — SODIUM CHLORIDE 0.9 % IV SOLN: INTRAVENOUS | Status: AC

## 2012-02-14 MED ORDER — MIDAZOLAM HCL 2 MG/2ML IJ SOLN
INTRAMUSCULAR | Status: AC
  Filled 2012-02-14: qty 2

## 2012-02-14 MED ORDER — ASPIRIN 81 MG PO CHEW
CHEWABLE_TABLET | ORAL | Status: AC
  Filled 2012-02-14: qty 4

## 2012-02-14 MED ORDER — ACETAMINOPHEN 325 MG PO TABS: 650.0000 mg | ORAL_TABLET | ORAL | Status: DC | PRN

## 2012-02-14 MED ORDER — LIDOCAINE HCL (PF) 1 % IJ SOLN
INTRAMUSCULAR | Status: AC
  Filled 2012-02-14: qty 30

## 2012-02-14 NOTE — Progress Notes (Addendum)
ANTICOAGULATION CONSULT NOTE - Follow Up Consult  Pharmacy Consult for Heparin Indication: chest pain/ACS  Assessment: 78yo with history of CAD - CABG and PCI with BMS on 02/03/2012. He now presents with chest pain and is on IV heparin until cardiac work up is complete. Heparin level (0.16) is below-goal on 1000 units/hr.  No problem with line per RN.  Goal of Therapy:  Heparin level 0.3-0.7 units/ml   Plan:  1. Heparin IV bolus of 1500 units x 1, then increase IV heparin to 1300 units/hr.  2. Heparin level in 8 hours.   Lorre Munroe, PharmD 02/14/2012 6:40 AM   Allergies  Allergen Reactions  . Azithromycin Itching  . Macrodantin Itching  . Minocin Itching  . Minocycline Hcl Itching  . Nitrofurantoin Itching  . Zithromax (Azithromycin Dihydrate) Itching    Patient Measurements: Height: 6\' 1"  (185.4 cm) Weight: 185 lb (83.915 kg) IBW/kg (Calculated) : 79.9   Vital Signs: Temp: 98.2 F (36.8 C) (03/11 0337) Temp src: Oral (03/11 0337) BP: 119/41 mmHg (03/11 0337) Pulse Rate: 77  (03/11 0600)  Labs:  Basename 02/14/12 0545 02/13/12 1919 02/13/12 1312 02/13/12 0743 02/13/12 0002  HGB -- -- -- -- 9.9*  HCT -- -- -- -- 30.6*  PLT -- -- -- -- 92*  APTT -- -- -- -- --  LABPROT -- -- -- -- --  INR -- -- -- -- --  HEPARINUNFRC 0.16* -- 0.30 -- --  CREATININE -- -- -- -- 1.13  CKTOTAL -- 23 26 25  --  CKMB -- 1.4 1.5 1.4 --  TROPONINI -- <0.30 <0.30 <0.30 --   Estimated Creatinine Clearance: 57 ml/min (by C-G formula based on Cr of 1.13).   Medications:  Scheduled:     . aspirin EC  325 mg Oral Daily  . atorvastatin  80 mg Oral q1800  . clopidogrel  600 mg Oral Once  . clopidogrel  75 mg Oral Q breakfast  . gabapentin  300 mg Oral TID  . heparin  4,000 Units Intravenous Once  . iron polysaccharides  150 mg Oral Daily  . isosorbide mononitrate  30 mg Oral Daily  . losartan  100 mg Oral Daily  . metoprolol succinate  25 mg Oral Daily  . sodium chloride  3 mL  Intravenous Q12H   Infusions:     . heparin 1,000 Units/hr (02/13/12 1011)   PRN: sodium chloride, acetaminophen, furosemide, HYDROcodone-acetaminophen, nitroGLYCERIN, ondansetron (ZOFRAN) IV, sodium chloride   Emeline Gins 02/14/2012,6:40 AM

## 2012-02-14 NOTE — Progress Notes (Signed)
ANTICOAGULATION CONSULT NOTE - Follow Up Consult  Pharmacy Consult for Heparin Indication: chest pain/ACS  Allergies  Allergen Reactions  . Azithromycin Itching  . Macrodantin Itching  . Minocin Itching  . Minocycline Hcl Itching  . Nitrofurantoin Itching  . Zithromax (Azithromycin Dihydrate) Itching    Vital Signs: Temp: 98.1 F (36.7 C) (03/11 1553) Temp src: Oral (03/11 1553) BP: 110/49 mmHg (03/11 1550) Pulse Rate: 67  (03/11 0735)  Labs:  Basename 02/14/12 1523 02/14/12 0545 02/13/12 1919 02/13/12 1312 02/13/12 0743 02/13/12 0002  HGB -- -- -- -- -- 9.9*  HCT -- -- -- -- -- 30.6*  PLT -- -- -- -- -- 92*  APTT -- -- -- -- -- --  LABPROT -- 14.7 -- -- -- --  INR -- 1.13 -- -- -- --  HEPARINUNFRC 0.52 0.16* -- 0.30 -- --  CREATININE -- -- -- -- -- 1.13  CKTOTAL -- -- 23 26 25  --  CKMB -- -- 1.4 1.5 1.4 --  TROPONINI -- -- <0.30 <0.30 <0.30 --   Estimated Creatinine Clearance: 57 ml/min (by C-G formula based on Cr of 1.13).  Assessment: 84yom with a therapeutic heparin level after rate increase. Heparin now off and patient in cath.  Goal of Therapy:  Heparin level 0.3-0.7 units/ml   Plan:  1) Follow up after cath  Fredrik Rigger 02/14/2012,4:59 PM

## 2012-02-14 NOTE — Interval H&P Note (Signed)
History and Physical Interval Note:  02/14/2012 4:42 PM  [name]  has presented today for surgery, with the diagnosis of cp  The various methods of treatment have been discussed with the patient and family. After consideration of risks, benefits and other options for treatment, the patient has consented to  Procedure(s) (LRB): LEFT HEART CATHETERIZATION WITH CORONARY/GRAFT ANGIOGRAM (N/A) as a surgical intervention .  The patients' history has been reviewed, patient examined, no change in status, stable for surgery.  I have reviewed the patients' chart and labs.  Questions were answered to the patient's satisfaction.     Rollene Rotunda

## 2012-02-14 NOTE — H&P (View-Only) (Signed)
Patient Name: [name] Date of Encounter: 02/14/2012     Principal Problem:  *ACS (acute coronary syndrome) Active Problems:  Coronary artery disease  DIABETES MELLITUS, TYPE II  HYPERLIPIDEMIA  HYPERTENSION  CEREBROVASCULAR DISEASE  Anemia  Thrombocytopenia    SUBJECTIVE  No chest pain since admission.  CE negative.  CURRENT MEDS    . aspirin EC  325 mg Oral Daily  . atorvastatin  80 mg Oral q1800  . clopidogrel  600 mg Oral Once  . clopidogrel  75 mg Oral Q breakfast  . gabapentin  300 mg Oral TID  . heparin  1,500 Units Intravenous Once  . heparin  4,000 Units Intravenous Once  . iron polysaccharides  150 mg Oral Daily  . isosorbide mononitrate  30 mg Oral Daily  . losartan  100 mg Oral Daily  . metoprolol succinate  25 mg Oral Daily  . sodium chloride  3 mL Intravenous Q12H    OBJECTIVE  Filed Vitals:   02/14/12 0500 02/14/12 0600 02/14/12 0735 02/14/12 0800  BP:   128/54   Pulse: 71 77 67   Temp:    97.9 F (36.6 C)  TempSrc:    Oral  Resp: 23 23 17    Height:      Weight:      SpO2: 95% 95% 95%     Intake/Output Summary (Last 24 hours) at 02/14/12 0918 Last data filed at 02/14/12 0800  Gross per 24 hour  Intake    246 ml  Output   1750 ml  Net  -1504 ml   Filed Weights   02/13/12 0757  Weight: 185 lb (83.915 kg)    PHYSICAL EXAM  General: Pleasant, NAD. Neuro: Alert and oriented X 3. Moves all extremities spontaneously. Psych: Normal affect. HEENT:  Normal  Neck: Supple with Left carotid bruit.  No JVD. Lungs:  Resp regular and unlabored, CTA. Heart: RRR no s3, s4.  2/6 SEM heard throughout. Abdomen: Soft, non-tender, non-distended, BS + x 4.  Extremities: No clubbing, cyanosis or edema. DP/PT/Radials 2+ and equal bilaterally.  No femoral bruits.  Accessory Clinical Findings  CBC  Basename 02/13/12 0042 02/13/12 0002  WBC -- 3.7*  NEUTROABS 2.3 --  HGB -- 9.9*  HCT -- 30.6*  MCV -- 91.1  PLT -- 92*   Basic  Metabolic Panel  Basename 02/13/12 0002  NA 139  K 4.0  CL 103  CO2 24  GLUCOSE 161*  BUN 29*  CREATININE 1.13  CALCIUM 9.5  MG --  PHOS --   Cardiac Enzymes  Basename 02/13/12 1919 02/13/12 1312 02/13/12 0743  CKTOTAL 23 26 25   CKMB 1.4 1.5 1.4  CKMBINDEX -- -- --  TROPONINI <0.30 <0.30 <0.30   TELE  rsr  ECG  Rsr, 67, LAD, no acute st/t changes.  Radiology/Studies  Dg Chest Port 1 View  02/07/2012  *RADIOLOGY REPORT*  Clinical Data: Chest pain.  Coronary artery disease.  Recent coronary stent placement.  PORTABLE CHEST - 1 VIEW  Comparison: 02/02/2012 and 06/19/2004  Findings: There is chronic cardiomegaly with evidence of prior CABG.  Pulmonary vascularity is normal.  Mild chronic accentuation of the interstitial markings.  No acute infiltrates or effusions.  IMPRESSION: No acute abnormalities.  Chronic cardiomegaly.  Original Report Authenticated By: Gwynn Burly, M.D.   ASSESSMENT AND PLAN  1.  USA/CAD:  Pt readmitted with intermittent nitrate responsive chest pain yesterday.  CE negative.  No recurrent c/p since admission.  H/H down  slightly from last admission but overall stable.  This is his second admission with chest pain since his PCI of the VG to RPDA in late February.  He re-elevated his troponin on the last admission in the setting of anemia, was transfused, and medically managed.  Given recurrent discomfort, we need to strongly consider relook catheterization.  Will discuss with Dr. [place].  Cont asa, statin, bb, arb, nitrate, heparin.  2.  HTN:  Stable.  3. HL:  Cont statin.  4.  DM:  Add ssi/cbg's.  5.  Anemia:  FOB negative & normal iron studies last week.  Stable.  6.  Thrombocytopenia:  Stable.  Signed, Nicolasa Ducking NP  History reviewed with the patient, no changes to be made. He has had recurrent pain since PCI.  All available labs, radiology testing, previous records reviewed. Agree with documented assessment and plan. Fayrene Fearing Airanna Partin   4:39 PM 10/22/2011

## 2012-02-14 NOTE — Progress Notes (Signed)
 Patient Name: [name] Date of Encounter: 02/14/2012     Principal Problem:  *ACS (acute coronary syndrome) Active Problems:  Coronary artery disease  DIABETES MELLITUS, TYPE II  HYPERLIPIDEMIA  HYPERTENSION  CEREBROVASCULAR DISEASE  Anemia  Thrombocytopenia    SUBJECTIVE  No chest pain since admission.  CE negative.  CURRENT MEDS    . aspirin EC  325 mg Oral Daily  . atorvastatin  80 mg Oral q1800  . clopidogrel  600 mg Oral Once  . clopidogrel  75 mg Oral Q breakfast  . gabapentin  300 mg Oral TID  . heparin  1,500 Units Intravenous Once  . heparin  4,000 Units Intravenous Once  . iron polysaccharides  150 mg Oral Daily  . isosorbide mononitrate  30 mg Oral Daily  . losartan  100 mg Oral Daily  . metoprolol succinate  25 mg Oral Daily  . sodium chloride  3 mL Intravenous Q12H    OBJECTIVE  Filed Vitals:   02/14/12 0500 02/14/12 0600 02/14/12 0735 02/14/12 0800  BP:   128/54   Pulse: 71 77 67   Temp:    97.9 F (36.6 C)  TempSrc:    Oral  Resp: 23 23 17   Height:      Weight:      SpO2: 95% 95% 95%     Intake/Output Summary (Last 24 hours) at 02/14/12 0918 Last data filed at 02/14/12 0800  Gross per 24 hour  Intake    246 ml  Output   1750 ml  Net  -1504 ml   Filed Weights   02/13/12 0757  Weight: 185 lb (83.915 kg)    PHYSICAL EXAM  General: Pleasant, NAD. Neuro: Alert and oriented X 3. Moves all extremities spontaneously. Psych: Normal affect. HEENT:  Normal  Neck: Supple with Left carotid bruit.  No JVD. Lungs:  Resp regular and unlabored, CTA. Heart: RRR no s3, s4.  2/6 SEM heard throughout. Abdomen: Soft, non-tender, non-distended, BS + x 4.  Extremities: No clubbing, cyanosis or edema. DP/PT/Radials 2+ and equal bilaterally.  No femoral bruits.  Accessory Clinical Findings  CBC  Basename 02/13/12 0042 02/13/12 0002  WBC -- 3.7*  NEUTROABS 2.3 --  HGB -- 9.9*  HCT -- 30.6*  MCV -- 91.1  PLT -- 92*   Basic  Metabolic Panel  Basename 02/13/12 0002  NA 139  K 4.0  CL 103  CO2 24  GLUCOSE 161*  BUN 29*  CREATININE 1.13  CALCIUM 9.5  MG --  PHOS --   Cardiac Enzymes  Basename 02/13/12 1919 02/13/12 1312 02/13/12 0743  CKTOTAL 23 26 25  CKMB 1.4 1.5 1.4  CKMBINDEX -- -- --  TROPONINI <0.30 <0.30 <0.30   TELE  rsr  ECG  Rsr, 67, LAD, no acute st/t changes.  Radiology/Studies  Dg Chest Port 1 View  02/07/2012  *RADIOLOGY REPORT*  Clinical Data: Chest pain.  Coronary artery disease.  Recent coronary stent placement.  PORTABLE CHEST - 1 VIEW  Comparison: 02/02/2012 and 06/19/2004  Findings: There is chronic cardiomegaly with evidence of prior CABG.  Pulmonary vascularity is normal.  Mild chronic accentuation of the interstitial markings.  No acute infiltrates or effusions.  IMPRESSION: No acute abnormalities.  Chronic cardiomegaly.  Original Report Authenticated By: Etienne Millward H. MAXWELL, M.D.   ASSESSMENT AND PLAN  1.  USA/CAD:  Pt readmitted with intermittent nitrate responsive chest pain yesterday.  CE negative.  No recurrent c/p since admission.  H/H down   slightly from last admission but overall stable.  This is his second admission with chest pain since his PCI of the VG to RPDA in late February.  He re-elevated his troponin on the last admission in the setting of anemia, was transfused, and medically managed.  Given recurrent discomfort, we need to strongly consider relook catheterization.  Will discuss with Dr. Jordan.  Cont asa, statin, bb, arb, nitrate, heparin.  2.  HTN:  Stable.  3. HL:  Cont statin.  4.  DM:  Add ssi/cbg's.  5.  Anemia:  FOB negative & normal iron studies last week.  Stable.  6.  Thrombocytopenia:  Stable.  Signed, Christopher Berge NP  History reviewed with the patient, no changes to be made. He has had recurrent pain since PCI.  All available labs, radiology testing, previous records reviewed. Agree with documented assessment and plan. [name]   4:39 PM 10/22/2011  

## 2012-02-14 NOTE — CV Procedure (Signed)
CARDIAC CATH  Procedure:  Selective Coronary Angiography, saphenous vein graft angiography, LIMA angiography  Indication: 78 year old gentleman presented with unstable angina.  Recent stent to SVG to PDA.  Presented with recurrent chest pain.  Second admission for pain since PCI   Diagnostic Procedure Details:  The right groin was prepped, draped, and anesthetized with 1% lidocaine. Using the modified Seldinger technique, a 5 French sheath was introduced into the right femoral artery. Standard Judkins catheters were used for selective coronary angiography. Saphenous vein graft and LIMA angiography were performed.Catheter exchanges were performed over a wire. The diagnostic procedure was well-tolerated without immediate complications.   PROCEDURAL FINDINGS  Hemodynamics:  AO 89/39 LV  NA  Coronary angiography:  Coronary dominance: right   Left mainstem: Occluded (There was minimal flow demonstrated in the previous cath.)  Left anterior descending (LAD): Heavy calcification with total proximal occlusion of the LAD. The vessel fills via a vein graft.  Moderate first diagonal with 25% stenosis.  Small second diagonal free of high grade disease.  Left circumflex (LCx): Heavy calcification with total proximal occlusion.  OM 1 large and branching with prox 95% stenosis.  Superior and inferior branches after the graft anastomosis have diffuse mild/moderate non obstructive plaque. OM2 is large with diffuse luminal irregularities.  PL small with moderate diffuse disease.   Right coronary artery (RCA): The RCA is heavily calcified. There is diffuse mid vessel disease noted. The RCA is totally occluded at the junction of the mid and distal vessel just after the acute marginal branch .  The distal anastomotic site appears intact to the PDA and is retrograde fills the posterolateral branch. PDA is small to moderate and free of high grade disease.  Saphenous vein graft to PDA:   There is a proximal patent  stent.  The remaining portions of the saphenous vein graft has diffuse nonobstructive disease noted but is widely patent to the PDA.   Saphenous vein graft sequential to the left circumflex: This vein graft is smooth throughout its course. It is sequential to two branches of a large first OM and a MOM. The graft is widely patent and the distal anastomotic sites are all widely patent.  LIMA:  Widely patent  Left ventriculography: NA (The valve was not crossed for pressures as this was done recently with the previous cath.)  Final Conclusions:  1. Severe three-vessel native coronary artery disease.  LM is now occluded.  There was scant flow through this previously. 2. Wide patency of the LIMA to LAD and saphenous vein graft sequenced to obtuse marginal branches left circumflex.  Widely patent recent stent in the SVG to the RCA.  Plan:   Continued medical    Rollene Rotunda 5:10 PM 02/14/2012

## 2012-02-15 ENCOUNTER — Encounter (HOSPITAL_COMMUNITY)
Admission: EM | Disposition: A | Discharge status: Home / Self Care | Payer: Self-pay | Source: Home / Self Care | Attending: Internal Medicine

## 2012-02-15 ENCOUNTER — Encounter (HOSPITAL_COMMUNITY): Payer: Self-pay | Admitting: Nurse Practitioner

## 2012-02-15 DIAGNOSIS — I200 Unstable angina: Secondary | ICD-10-CM

## 2012-02-15 LAB — CBC
HCT: 35 % — ABNORMAL LOW (ref 39.0–52.0)
MCV: 90.9 fL (ref 78.0–100.0)
RBC: 3.85 MIL/uL — ABNORMAL LOW (ref 4.22–5.81)
WBC: 4.8 10*3/uL (ref 4.0–10.5)

## 2012-02-15 LAB — BASIC METABOLIC PANEL
BUN: 23 mg/dL (ref 6–23)
CO2: 25 mEq/L (ref 19–32)
Chloride: 99 mEq/L (ref 96–112)
Creatinine, Ser: 1.01 mg/dL (ref 0.50–1.35)
Glucose, Bld: 184 mg/dL — ABNORMAL HIGH (ref 70–99)

## 2012-02-15 SURGERY — ATRIAL FLUTTER ABLATION
Anesthesia: Monitor Anesthesia Care

## 2012-02-15 MED ORDER — METFORMIN HCL 500 MG PO TABS: 500.0000 mg | ORAL_TABLET | Freq: Two times a day (BID) | ORAL | Status: DC

## 2012-02-15 NOTE — Discharge Summary (Signed)
Patient ID: [name],  MRN: [id number], DOB/AGE: June 22, 1927 78 y.o.  Admit date: 02/12/2012 Discharge date: 02/15/2012  Primary Care Provider: Garlan Fillers, MD Primary Cardiologist: T. Ceylin Dreibelbis, MD  Discharge Diagnoses Principal Problem:  *Unstable angina Active Problems:  Coronary artery disease - s/p CABG  DIABETES MELLITUS, TYPE II  HYPERLIPIDEMIA  HYPERTENSION  CEREBROVASCULAR DISEASE  Anemia  Thrombocytopenia   Allergies Allergies  Allergen Reactions  . Azithromycin Itching  . Macrodantin Itching  . Minocin Itching  . Minocycline Hcl Itching  . Nitrofurantoin Itching  . Zithromax (Azithromycin Dihydrate) Itching    Procedures  2D Echocardiogram 02/14/2012  Study Conclusions  - Left ventricle: The cavity size was normal. Crysta Gulick thickness   was increased in a pattern of mild LVH. The estimated   ejection fraction was 55%. Tallyn Holroyd motion was normal; there   were no regional Romain Erion motion abnormalities. Features are   consistent with a pseudonormal left ventricular filling   pattern, with concomitant abnormal relaxation and   increased filling pressure (grade 2 diastolic   dysfunction). Doppler parameters are consistent with high   ventricular filling pressure. - Aortic valve: There was moderate stenosis. Mean gradient:   29mm Hg (S). Peak gradient: 52mm Hg (S). - Mitral valve: Moderately calcified annulus. - Left atrium: The atrium was mildly dilated. - Right ventricle: The cavity size was mildly dilated. - Right atrium: The atrium was mildly dilated. - Pulmonary arteries: PA peak pressure: 43mm Hg (S). __________________________  Cardiac Catheterization 02/14/2012  Coronary angiography:   Coronary dominance: right  Left mainstem: Occluded (There was minimal flow demonstrated in the previous cath.)  Left anterior descending (LAD): Heavy calcification with total proximal occlusion of the LAD. The vessel fills via a vein graft.  Moderate first diagonal with  25% stenosis.  Small second diagonal free of high grade disease.  Left circumflex (LCx): Heavy calcification with total proximal occlusion.  OM 1 large and branching with prox 95% stenosis.  Superior and inferior branches after the graft anastomosis have diffuse mild/moderate non obstructive plaque. OM2 is large with diffuse luminal irregularities.  PL small with moderate diffuse disease.  Right coronary artery (RCA): The RCA is heavily calcified. There is diffuse mid vessel disease noted. The RCA is totally occluded at the junction of the mid and distal vessel just after the acute marginal branch .  The distal anastomotic site appears intact to the PDA and is retrograde fills the posterolateral branch. PDA is small to moderate and free of high grade disease.  Saphenous vein graft to PDA:   There is a proximal patent stent.  The remaining portions of the saphenous vein graft has diffuse nonobstructive disease noted but is widely patent to the PDA.   Saphenous vein graft sequential to the left circumflex: This vein graft is smooth throughout its course. It is sequential to two branches of a large first OM and a MOM. The graft is widely patent and the distal anastomotic sites are all widely patent.  LIMA:  Widely patent ____________________________  History of Present Illness  78 year old male with the above complex problem list who is recently status post non-ST elevation MI in February of 2013 bare metal stent placed to the vein graft to the right PDA on February 28. Patient was readmitted in early March with mild elevation of troponin in the setting of anemia. He was transfused and medically managed. Unfortunately, on the day of this admission, he had recurrence of nitrate responsive chest discomfort both at rest and with  exertion. This prompted him to present to the [facility] the ED where his ECG was nonacute and his cardiac markers were normal. He was admitted for further evaluation.   Hospital  Course  Following admission, patient had no further chest discomfort. Cardiac enzymes remained negative. As this was the patient's second readmission and just 10 days following his last stent procedure, decision was made to pursue cardiac catheterization. This took place on March 11 and showed 4 of 4 patent grafts. The previously placed stent in the vein graft to the PDA was widely patent. Patient has severe multivessel coronary artery disease and it was noted that his left main, which previously had limited flow is now occluded. Decision was made to continue with medical therapy.  Post catheterization, patient has been ambulating without recurrent symptoms or limitations. He will be discharged home today in good condition.  Discharge Vitals Blood pressure 141/49, pulse 60, temperature 98.3 F (36.8 C), temperature source Oral, resp. rate 18, height 6\' 1"  (1.854 m), weight 185 lb (83.915 kg), SpO2 94.00%.  Filed Weights   02/13/12 0757  Weight: 185 lb (83.915 kg)    Labs  CBC  Basename 02/13/12 0042 02/13/12 0002  WBC -- 3.7*  NEUTROABS 2.3 --  HGB -- 9.9*  HCT -- 30.6*  MCV -- 91.1  PLT -- 92*   Basic Metabolic Panel  Basename 02/13/12 0002  NA 139  K 4.0  CL 103  CO2 24  GLUCOSE 161*  BUN 29*  CREATININE 1.13  CALCIUM 9.5  MG --  PHOS --   Cardiac Enzymes  Basename 02/13/12 1919 02/13/12 1312 02/13/12 0743  CKTOTAL 23 26 25   CKMB 1.4 1.5 1.4  CKMBINDEX -- -- --  TROPONINI <0.30 <0.30 <0.30   Disposition  Pt is being discharged home today in good condition.  Follow-up Plans & Appointments  Follow-up Information    Follow up with Garlan Fillers, MD. (as scheduled)       Follow up with Valera Castle, MD on 02/29/2012. (3:00 PM)    Contact information:   [address] [place] [address] [phone]          Discharge Medications  Medication List  As of 02/15/2012  9:58 AM   TAKE these medications          aspirin EC 81 MG tablet   Take 81 mg by mouth daily.      clopidogrel 75 MG tablet   Commonly known as: PLAVIX   Take 75 mg by mouth daily with breakfast.      furosemide 40 MG tablet   Commonly known as: LASIX   Take 40 mg by mouth as needed.      gabapentin 300 MG capsule   Commonly known as: NEURONTIN   Take 300 mg by mouth 3 (three) times daily. For shingles pain      HYDROcodone-acetaminophen 5-325 MG per tablet   Commonly known as: NORCO   Take 1 tablet by mouth 3 (three) times daily as needed. Pt stated he tries to take as scheduled for shingles pain      iron polysaccharides 150 MG capsule   Commonly known as: NIFEREX   Take 150 mg by mouth daily.      isosorbide mononitrate 30 MG 24 hr tablet   Commonly known as: IMDUR   Take 30 mg by mouth daily.      losartan 100 MG tablet   Commonly known as: COZAAR  Take 1 tablet by mouth Daily.      metFORMIN 500 MG tablet   Commonly known as: GLUCOPHAGE   Take 1 tablet (500 mg total) by mouth 2 (two) times daily with a meal.      metoprolol succinate 25 MG 24 hr tablet   Commonly known as: TOPROL-XL   Take 25 mg by mouth daily.      nitroGLYCERIN 0.4 MG SL tablet   Commonly known as: NITROSTAT   Place 0.4 mg under the tongue every 5 (five) minutes as needed. For chest pain.      pravastatin 40 MG tablet   Commonly known as: PRAVACHOL   Take 40 mg by mouth daily.            Outstanding Labs/Studies  None  Duration of Discharge Encounter   Greater than 30 minutes including physician time.  Signed, Nicolasa Ducking NP 02/15/2012, 9:58 AM   Jesse Sans. Daleen Squibb, MD, [facility] Pager:  [phone]

## 2012-02-15 NOTE — Progress Notes (Signed)
Patient Name: [name] Date of Encounter: 02/15/2012     Principal Problem:  *Unstable angina Active Problems:  Coronary artery disease  DIABETES MELLITUS, TYPE II  HYPERLIPIDEMIA  HYPERTENSION  CEREBROVASCULAR DISEASE  Anemia  Thrombocytopenia    SUBJECTIVE  No chest pain or sob overnight.  Groin feels ok.  Eager to go home.  CURRENT MEDS    . aspirin  324 mg Oral Pre-Cath  . aspirin EC  325 mg Oral Daily  . atorvastatin  80 mg Oral q1800  . clopidogrel  75 mg Oral Q breakfast  . fentaNYL      . gabapentin  300 mg Oral TID  . heparin      . heparin  1,500 Units Intravenous Once  . iron polysaccharides  150 mg Oral Daily  . isosorbide mononitrate  30 mg Oral Daily  . lidocaine      . losartan  100 mg Oral Daily  . metoprolol succinate  25 mg Oral Daily  . midazolam      . nitroGLYCERIN      . DISCONTD: sodium chloride  3 mL Intravenous Q12H    OBJECTIVE  Filed Vitals:   02/14/12 2100 02/15/12 0005 02/15/12 0335 02/15/12 0405  BP: 105/43 106/42 117/39   Pulse:      Temp:  97.7 F (36.5 C)  98.2 F (36.8 C)  TempSrc:  Oral  Oral  Resp:  20  18  Height:      Weight:      SpO2:  96%  98%    Intake/Output Summary (Last 24 hours) at 02/15/12 0726 Last data filed at 02/15/12 0000  Gross per 24 hour  Intake 2707.6 ml  Output    175 ml  Net 2532.6 ml   Filed Weights   02/13/12 0757  Weight: 185 lb (83.915 kg)    PHYSICAL EXAM  General: Pleasant, NAD. Neuro: Alert and oriented X 3. Moves all extremities spontaneously. Psych: Normal affect. HEENT:  Normal  Neck: Supple without JVD.  Left carotid bruit. Lungs:  Resp regular and unlabored, CTA. Heart: RRR no s3, s4.  2/6 SEM heard throughout Abdomen: Soft, non-tender, non-distended, BS + x 4.  Extremities: No clubbing, cyanosis or edema. DP/PT/Radials 2+ and equal bilaterally.  R groin w/o bleeding, bruit, hematoma.  Accessory Clinical Findings  CBC  Basename 02/13/12 0042 02/13/12  0002  WBC -- 3.7*  NEUTROABS 2.3 --  HGB -- 9.9*  HCT -- 30.6*  MCV -- 91.1  PLT -- 92*   Basic Metabolic Panel  Basename 02/13/12 0002  NA 139  K 4.0  CL 103  CO2 24  GLUCOSE 161*  BUN 29*  CREATININE 1.13  CALCIUM 9.5  MG --  PHOS --   Cardiac Enzymes  Basename 02/13/12 1919 02/13/12 1312 02/13/12 0743  CKTOTAL 23 26 25   CKMB 1.4 1.5 1.4  CKMBINDEX -- -- --  TROPONINI <0.30 <0.30 <0.30    TELE   Sinus, pac's/sinus arrhythmia  Studies  2D Echocardiogram 02/14/2012 Study Conclusions  - Left ventricle: The cavity size was normal. Christophor Eick thickness   was increased in a pattern of mild LVH. The estimated   ejection fraction was 55%. Demmi Sindt motion was normal; there   were no regional Jennifr Gaeta motion abnormalities. Features are   consistent with a pseudonormal left ventricular filling   pattern, with concomitant abnormal relaxation and   increased filling pressure (grade 2 diastolic   dysfunction). Doppler parameters are consistent with high  ventricular filling pressure. - Aortic valve: There was moderate stenosis. Mean gradient:   29mm Hg (S). Peak gradient: 52mm Hg (S). - Mitral valve: Moderately calcified annulus. - Left atrium: The atrium was mildly dilated. - Right ventricle: The cavity size was mildly dilated. - Right atrium: The atrium was mildly dilated. - Pulmonary arteries: PA peak pressure: 43mm Hg (S). _____________________  Cardiac Catheterization 02/14/2012  Coronary angiography:   Coronary dominance: right   Left mainstem: Occluded (There was minimal flow demonstrated in the previous cath.)  Left anterior descending (LAD): Heavy calcification with total proximal occlusion of the LAD. The vessel fills via a vein graft.  Moderate first diagonal with 25% stenosis.  Small second diagonal free of high grade disease.  Left circumflex (LCx): Heavy calcification with total proximal occlusion.  OM 1 large and branching with prox 95% stenosis.  Superior and  inferior branches after the graft anastomosis have diffuse mild/moderate non obstructive plaque. OM2 is large with diffuse luminal irregularities.  PL small with moderate diffuse disease.    Right coronary artery (RCA): The RCA is heavily calcified. There is diffuse mid vessel disease noted. The RCA is totally occluded at the junction of the mid and distal vessel just after the acute marginal branch .  The distal anastomotic site appears intact to the PDA and is retrograde fills the posterolateral branch. PDA is small to moderate and free of high grade disease.  Saphenous vein graft to PDA:   There is a proximal patent stent.  The remaining portions of the saphenous vein graft has diffuse nonobstructive disease noted but is widely patent to the PDA.   Saphenous vein graft sequential to the left circumflex: This vein graft is smooth throughout its course. It is sequential to two branches of a large first OM and a MOM. The graft is widely patent and the distal anastomotic sites are all widely patent.  LIMA:  Widely patent _________________________  ASSESSMENT AND PLAN  1.  USA/CAD:  No recurrent c/p.  S/p cath yesterday revealing severe occlusive 3vd, now with total occlusion of the LM.  4/4 patent grafts including patent stent w/in vg-pda.  Medical therapy rec.  Cont asa, plavix, statin, bb, arb, nitrate.  Not much bp room for titration of nitrate @ this time.  2.  HTN:  Stable.  3.  HL:  Cont statin.  4.  DM:  Cont ssi.  5.  Anemia:  F/u this AM.  6.  Thrombocytopenia:  Has been stable.  F/u this AM.  7.  Dispo:  Ambulate, f/u labs, prob d/c later this AM.  Signed, Nicolasa Ducking NP Patient examined and agree except changes made. Check labs and probably discharge later today.  Valera Castle, MD 02/15/2012 8:18 AM

## 2012-02-17 ENCOUNTER — Encounter: Payer: Medicare Other | Admitting: Physician Assistant

## 2012-02-29 ENCOUNTER — Ambulatory Visit (INDEPENDENT_AMBULATORY_CARE_PROVIDER_SITE_OTHER): Payer: Medicare Other | Admitting: Cardiology

## 2012-02-29 ENCOUNTER — Encounter: Payer: Self-pay | Admitting: Cardiology

## 2012-02-29 VITALS — BP 130/52 | HR 58 | Resp 19 | Ht 73.0 in | Wt 195.8 lb

## 2012-02-29 DIAGNOSIS — I359 Nonrheumatic aortic valve disorder, unspecified: Secondary | ICD-10-CM

## 2012-02-29 DIAGNOSIS — E785 Hyperlipidemia, unspecified: Secondary | ICD-10-CM

## 2012-02-29 DIAGNOSIS — I2510 Atherosclerotic heart disease of native coronary artery without angina pectoris: Secondary | ICD-10-CM

## 2012-02-29 DIAGNOSIS — I350 Nonrheumatic aortic (valve) stenosis: Secondary | ICD-10-CM

## 2012-02-29 DIAGNOSIS — D696 Thrombocytopenia, unspecified: Secondary | ICD-10-CM

## 2012-02-29 DIAGNOSIS — I10 Essential (primary) hypertension: Secondary | ICD-10-CM

## 2012-02-29 DIAGNOSIS — R0989 Other specified symptoms and signs involving the circulatory and respiratory systems: Secondary | ICD-10-CM

## 2012-02-29 NOTE — Progress Notes (Signed)
HPI [name] returns today after being hospitalized with recurrent angina. Catheterization showed patent stents with good LV function.  His angina and chest discomfort is now resolved. He is compliant with his medications.  He's always had low HDL. Consider putting him in the accelerate trial.  Past Medical History  Diagnosis Date  . CAD (coronary artery disease)     a. s/p CABG 1995;  b. NSTEMI & subsequent BMS to SVG->right PDA 02/03/12.;  c. Cath 02/14/2012 3vd with 4/4 patent grafts and patent stent in vg->pda.  . Cerebrovascular disease, unspecified   . Unspecified essential hypertension   . Other and unspecified hyperlipidemia   . Type II or unspecified type diabetes mellitus without mention of complication, not stated as uncontrolled   . Osteoarthritis (arthritis due to wear and tear of joints)   . Asthmatic bronchitis   . BPH (benign prostatic hypertrophy)   . Herpes zoster   . Arthritis   . Anemia   . Thrombocytopenia   . Aortic stenosis, moderate     a. Echo 02/14/12 EF 55%, Gr 2 DD, Mod AS (mean grad: 29, peak grad 52)    Current Outpatient Prescriptions  Medication Sig Dispense Refill  . aspirin EC 81 MG tablet Take 81 mg by mouth daily.      . clopidogrel (PLAVIX) 75 MG tablet Take 75 mg by mouth daily with breakfast.      . furosemide (LASIX) 40 MG tablet Take 40 mg by mouth as needed.      . gabapentin (NEURONTIN) 300 MG capsule Take 300 mg by mouth 3 (three) times daily. For shingles pain      . HYDROcodone-acetaminophen (NORCO) 5-325 MG per tablet Take 1 tablet by mouth 3 (three) times daily as needed. Pt stated he tries to take as scheduled for shingles pain      . isosorbide mononitrate (IMDUR) 30 MG 24 hr tablet Take 30 mg by mouth daily.      Marland Kitchen losartan (COZAAR) 100 MG tablet Take 1 tablet by mouth Daily.      . metFORMIN (GLUCOPHAGE) 500 MG tablet Take 1 tablet (500 mg total) by mouth 2 (two) times daily with a meal.      . metoprolol succinate (TOPROL-XL) 25  MG 24 hr tablet Take 25 mg by mouth daily.       . nitroGLYCERIN (NITROSTAT) 0.4 MG SL tablet Place 0.4 mg under the tongue every 5 (five) minutes as needed. For chest pain.      . pravastatin (PRAVACHOL) 40 MG tablet Take 40 mg by mouth daily.      . iron polysaccharides (NIFEREX) 150 MG capsule Take 150 mg by mouth daily.        Allergies  Allergen Reactions  . Azithromycin Itching  . Macrodantin Itching  . Minocin Itching  . Minocycline Hcl Itching  . Nitrofurantoin Itching  . Zithromax (Azithromycin Dihydrate) Itching    Family History  Problem Relation Age of Onset  . Coronary artery disease    . Alcohol abuse      History   Social History  . Marital Status: Married    Spouse Name: N/A    Number of Children: N/A  . Years of Education: N/A   Occupational History  . retired [employer]   Social History Main Topics  . Smoking status: Former Games developer  . Smokeless tobacco: Never Used   Comment: quit in 1986  . Alcohol Use: No  . Drug Use: No  . Sexually  Active: Not Currently   Other Topics Concern  . Not on file   Social History Narrative  . No narrative on file    ROS ALL NEGATIVE EXCEPT THOSE NOTED IN HPI  PE  General Appearance: well developed, well nourished in no acute distress HEENT: symmetrical face, PERRLA, good dentition  Neck: no JVD, thyromegaly, or adenopathy, trachea midline Chest: symmetric without deformity Cardiac: PMI non-displaced, RRR, normal S1, S2, no gallop or murmur Lung: clear to ausculation and percussion Vascular: all pulses full, Right carotid bruit Abdominal: nondistended, nontender, good bowel sounds, no HSM, no bruits Extremities: no cyanosis, clubbing or edema, no sign of DVT, no varicosities  Skin: normal color, no rashes Neuro: alert and oriented x 3, non-focal Pysch: normal affect  EKG  BMET    Component Value Date/Time   NA 134* 02/15/2012 0903   K 4.8 02/15/2012 0903   CL 99 02/15/2012 0903   CO2 25 02/15/2012 0903    GLUCOSE 184* 02/15/2012 0903   BUN 23 02/15/2012 0903   CREATININE 1.01 02/15/2012 0903   CALCIUM 10.2 02/15/2012 0903   GFRNONAA 67* 02/15/2012 0903   GFRAA 78* 02/15/2012 0903    Lipid Panel     Component Value Date/Time   CHOL 111 02/02/2012 0910   TRIG 76 02/02/2012 0910   HDL 24* 02/02/2012 0910   CHOLHDL 4.6 02/02/2012 0910   VLDL 15 02/02/2012 0910   LDLCALC 72 02/02/2012 0910    CBC    Component Value Date/Time   WBC 4.8 02/15/2012 0903   RBC 3.85* 02/15/2012 0903   HGB 11.3* 02/15/2012 0903   HCT 35.0* 02/15/2012 0903   PLT 102* 02/15/2012 0903   MCV 90.9 02/15/2012 0903   MCH 29.4 02/15/2012 0903   MCHC 32.3 02/15/2012 0903   RDW 17.7* 02/15/2012 0903   LYMPHSABS 0.8 02/13/2012 0042   MONOABS 0.3 02/13/2012 0042   EOSABS 0.1 02/13/2012 0042   BASOSABS 0.0 02/13/2012 0042

## 2012-02-29 NOTE — Assessment & Plan Note (Signed)
Stable. He is now finally settled down from August chest discomfort. Continue current medications. I will see back in 3 months.

## 2012-02-29 NOTE — Assessment & Plan Note (Signed)
Stable.  No change in therapy. 

## 2012-02-29 NOTE — Patient Instructions (Addendum)
Your physician recommends that you schedule a follow-up appointment in: 3 months with Dr Wall  

## 2012-03-01 ENCOUNTER — Telehealth: Payer: Self-pay | Admitting: Cardiology

## 2012-03-01 MED ORDER — CLOPIDOGREL BISULFATE 75 MG PO TABS: 75.0000 mg | ORAL_TABLET | Freq: Every day | ORAL | Status: DC

## 2012-03-01 NOTE — Telephone Encounter (Signed)
Pt will continue taking plavix as prescribed.

## 2012-03-01 NOTE — Telephone Encounter (Signed)
New Msg: Pt calling wanting to know if MD wants PT to continue taking clopidogrel (plavix) 75 mg, per pt d/c instructions. Please return pt wife call to discuss further.

## 2012-03-06 ENCOUNTER — Telehealth: Payer: Self-pay | Admitting: Hematology and Oncology

## 2012-03-06 NOTE — Telephone Encounter (Signed)
Referred by Dr. Brunilda Payor Dx- Persistant Anemia

## 2012-03-08 ENCOUNTER — Ambulatory Visit: Payer: Medicare Other | Admitting: Hematology and Oncology

## 2012-03-08 ENCOUNTER — Ambulatory Visit: Payer: Medicare Other

## 2012-03-10 ENCOUNTER — Ambulatory Visit: Payer: Medicare Other | Admitting: Hematology and Oncology

## 2012-03-10 ENCOUNTER — Ambulatory Visit: Payer: Medicare Other

## 2012-03-10 ENCOUNTER — Telehealth: Payer: Self-pay | Admitting: Hematology and Oncology

## 2012-03-10 NOTE — Telephone Encounter (Signed)
R/s from 4/5 per LO. S/w wife this morning re new d/t for 4/11 @ 9:30 am.

## 2012-03-16 ENCOUNTER — Telehealth: Payer: Self-pay | Admitting: Hematology and Oncology

## 2012-03-16 ENCOUNTER — Ambulatory Visit (HOSPITAL_BASED_OUTPATIENT_CLINIC_OR_DEPARTMENT_OTHER): Payer: Medicare Other | Admitting: Hematology and Oncology

## 2012-03-16 ENCOUNTER — Ambulatory Visit: Payer: Medicare Other

## 2012-03-16 ENCOUNTER — Ambulatory Visit (HOSPITAL_BASED_OUTPATIENT_CLINIC_OR_DEPARTMENT_OTHER): Payer: Medicare Other | Admitting: Lab

## 2012-03-16 ENCOUNTER — Encounter: Payer: Self-pay | Admitting: Hematology and Oncology

## 2012-03-16 VITALS — BP 144/62 | HR 71 | Temp 97.5°F | Ht 73.0 in | Wt 198.3 lb

## 2012-03-16 DIAGNOSIS — D649 Anemia, unspecified: Secondary | ICD-10-CM

## 2012-03-16 DIAGNOSIS — B0222 Postherpetic trigeminal neuralgia: Secondary | ICD-10-CM

## 2012-03-16 DIAGNOSIS — D696 Thrombocytopenia, unspecified: Secondary | ICD-10-CM

## 2012-03-16 DIAGNOSIS — D539 Nutritional anemia, unspecified: Secondary | ICD-10-CM

## 2012-03-16 DIAGNOSIS — M792 Neuralgia and neuritis, unspecified: Secondary | ICD-10-CM | POA: Insufficient documentation

## 2012-03-16 DIAGNOSIS — R634 Abnormal weight loss: Secondary | ICD-10-CM

## 2012-03-16 DIAGNOSIS — N400 Enlarged prostate without lower urinary tract symptoms: Secondary | ICD-10-CM | POA: Insufficient documentation

## 2012-03-16 DIAGNOSIS — E785 Hyperlipidemia, unspecified: Secondary | ICD-10-CM | POA: Insufficient documentation

## 2012-03-16 LAB — URINALYSIS, MICROSCOPIC - CHCC
Glucose: NEGATIVE g/dL
Ketones: NEGATIVE mg/dL
Specific Gravity, Urine: 1.03 (ref 1.003–1.035)

## 2012-03-16 LAB — CBC & DIFF AND RETIC
BASO%: 0.5 % (ref 0.0–2.0)
Eosinophils Absolute: 0.1 10*3/uL (ref 0.0–0.5)
HCT: 27.4 % — ABNORMAL LOW (ref 38.4–49.9)
Immature Retic Fract: 17.4 % — ABNORMAL HIGH (ref 3.00–10.60)
MCHC: 31.4 g/dL — ABNORMAL LOW (ref 32.0–36.0)
MONO#: 0.5 10*3/uL (ref 0.1–0.9)
NEUT#: 2.1 10*3/uL (ref 1.5–6.5)
NEUT%: 55.5 % (ref 39.0–75.0)
Retic Ct Abs: 71.2 10*3/uL (ref 34.80–93.90)
WBC: 3.7 10*3/uL — ABNORMAL LOW (ref 4.0–10.3)
lymph#: 1.1 10*3/uL (ref 0.9–3.3)
nRBC: 1 % — ABNORMAL HIGH (ref 0–0)

## 2012-03-16 LAB — MORPHOLOGY: PLT EST: DECREASED

## 2012-03-16 NOTE — Progress Notes (Signed)
CC:   [name]. [name], M.D. Richard C. Wall, MD, [facility]  IDENTIFYING STATEMENT:  The patient is an 78 year old man seen at request of Dr. Eloise [name] with anemia.  HISTORY OF PRESENT ILLNESS:  The patient states that he was found to have anemia following angioplasty with stent placement in February 2013.  He was given 2 units of packed RBCs at that time.  He also recalls receiving a blood transfusion at the time of his coronary artery bypass graft in 1995.  He was placed on Plavix in March 2013.  He was never on Coumadin. He has had no issues with overt blood loss specifically denying dysuria, hematuria or hematemesis.  He only tends to bruise when he injures himself.  He denies petechiae.  He notes weight loss, an average of 30 pounds.  He specifically blames this to poor appetite, specifically following his diagnosis of postherpetic neuralgia.  His last colonoscopy was on 01/29/2005 and which time Dr. Katha [name] had noted polyps. CBC on February 08, 2012 had noted a white cell count of 6.3, hemoglobin 10.9, hematocrit 33.5, and platelets of 104.  PAST MEDICAL HISTORY: 1. Coronary artery disease status post CABG in 1995 and status post     angioplasty with stent placement on 02/02/2012. 2. Aortic stenosis, moderate. 3. Hypertension. 4. Diabetes. 5. COPD. 6. Cerebrovascular accident. 7. History of carotid bruit. 8. Hyperlipidemia. 9. Benign prostatic hypertrophy. 10.History of basal cell skin cancer. 11.Osteoarthritis. 12.Postherpetic neuralgia involving trigeminal nerves.  ALLERGIES:  Azithromycin, Macrodantin, Minocin, nitrofurantoin.  MEDICATIONS:  Aspirin 81 mg daily, Plavix 75 mg daily, Neurontin 200 mg t.i.d., Norco 5/325 one tab t.i.d. p.r.n., Imdur 30 mg daily, Cozaar 100 mg daily, Glucophage 500 mg b.i.d., Toprol-XL 25 mg daily, nitroglycerin patch as needed, Pravachol 40 mg daily.  SOCIAL HISTORY:  The patient is a former smoker.  He quit in the 1970s. He smoked for  25 years.  Denies alcohol use.  He is married.  He is now retired from [employer].  FAMILY HISTORY:  Negative for oncologic or hematologic malignancies.  REVIEW OF SYSTEMS:  He denies fever, chills, night sweats.  Admits to anorexia and has lost 30 pounds in weight secondary to postherpetic neuralgia.  Describes chronic pain from neuralgia.  Rates pain at its worst 7/10 managed with narcotics.  GI:  Denies nausea, vomiting, abdominal pain, diarrhea, melena, hematochezia.  GU:  Denies dysuria, hematuria, nocturia, frequency.  Skin:  Denies bruising or bleeding. Cardiovascular:  Denies chest pain, PND, orthopnea, ankle swelling. Respirations:  Denies cough, hemoptysis, wheeze, shortness of breath. Lymph nodes:  Denies swelling.  CNS:  Denies headaches, vision change, extremity weakness.  PHYSICAL EXAM:  The patient is an older man, alert and orientated x3. Vitals:  Pulse 71, blood pressure 144/62, temperature 97.5, respirations 18, weight 198 pounds.  HEENT:  Head is atraumatic, normocephalic. Sclerae anicteric.  Mouth moist.  Neck:  Supple.  Chest:  Clear to percussion.  Cardiovascular:  Systolic murmur in the sternal border. Abdomen:  Soft.  No masses.  No hepatosplenomegaly.  Bowel sounds present.  Extremities:  Trace edema bilaterally.  No calf tenderness. CNS:  Some residual right-sided weakness following a past CVA.  Lymph nodes:  No adenopathy.  IMPRESSION AND PLAN:  [name] is an 78 year old gentleman with anemia and thrombocytopenia.  The differential is wide, so at this point he needs a hematological workup.  We did discuss a bone marrow effect as a potential cause.  We did discuss the need for an  invasive procedure such as a bone marrow biopsy, but the patient is not willing to forego this at this time.  He is willing to undergo lab work to include an ultrasound of the liver and spleen to assess size.  He will thus return to the lab to obtain a CBC with differential and  review morphology. Will also obtain a comprehensive metabolic panel.  We will obtain a serum erythropoietin level and retic count.  Rule out hemolysis as he does have aortic stenosis with Coombs, LDH and haptoglobin.  Rule out myelodyscrasia with serum protein electrophoresis and IST.  Will make sure the patient does not have blood in his urine or stools.  We spent more than half the time with the patient coordinating care and discussing potential pathology.  The patient returns to discuss results.    ______________________________ [name], M.D. LIO/MEDQ  D:  03/16/2012  T:  03/16/2012  Job:  [id number]

## 2012-03-16 NOTE — Progress Notes (Signed)
Addended by: Normajean Baxter LE on: 03/16/2012 12:45 PM   Modules accepted: Orders

## 2012-03-16 NOTE — Patient Instructions (Signed)
Patient to follow up as instructed.   Current Outpatient Prescriptions  Medication Sig Dispense Refill  . aspirin EC 81 MG tablet Take 81 mg by mouth daily.      . clopidogrel (PLAVIX) 75 MG tablet Take 1 tablet (75 mg total) by mouth daily with breakfast.  90 tablet  3  . furosemide (LASIX) 40 MG tablet Take 40 mg by mouth as needed.      . gabapentin (NEURONTIN) 300 MG capsule Take 300 mg by mouth 3 (three) times daily. For shingles pain      . HYDROcodone-acetaminophen (NORCO) 5-325 MG per tablet Take 1 tablet by mouth 3 (three) times daily as needed. Pt stated he tries to take as scheduled for shingles pain      . iron polysaccharides (NIFEREX) 150 MG capsule Take 150 mg by mouth daily.      . isosorbide mononitrate (IMDUR) 30 MG 24 hr tablet Take 30 mg by mouth daily.      Marland Kitchen losartan (COZAAR) 100 MG tablet Take 1 tablet by mouth Daily.      . metFORMIN (GLUCOPHAGE) 500 MG tablet Take 1 tablet (500 mg total) by mouth 2 (two) times daily with a meal.      . metoprolol succinate (TOPROL-XL) 25 MG 24 hr tablet Take 25 mg by mouth daily.       . nitroGLYCERIN (NITROSTAT) 0.4 MG SL tablet Place 0.4 mg under the tongue every 5 (five) minutes as needed. For chest pain.      . pravastatin (PRAVACHOL) 40 MG tablet Take 40 mg by mouth daily.            April 2013  Sunday Monday Tuesday Wednesday Thursday Friday Saturday      1   2   3   4   5   6    7   8   9   10   11    FINANCIAL COUNSELING   9:30 AM  (30 min.)  [facility]  [facility]   NEW PATIENT 60  10:00 AM  (60 min.)  Laurice Record, MD  [facility] 12   13    14   15   16   17   18   19   20    21   22   23   24   25   26   27    28   29    30

## 2012-03-16 NOTE — Progress Notes (Signed)
Dr.    Ivery Quale      -     Primary Dr.    Valera Castle      -      Cardiologist. Dr.    Dagoberto Ligas      -       Ophthalmologist. Dr.    Donzetta Starch        -      Dermatologist. Dr.    Einar Pheasant         -      ENT.  [facility]  On   [address]  Cell     Phone    [phone]      -     DO NOT  LEAVE VOICE MAIL. Home   Phone       [phone]       -    OK  TO LEAVE VOICE  MAIL.

## 2012-03-16 NOTE — Telephone Encounter (Signed)
sent pt to lab.  gv pt appt for april2013.  scheduled u/s from 04/12 @ [facility]

## 2012-03-16 NOTE — Progress Notes (Signed)
This office note has been dictated.

## 2012-03-17 ENCOUNTER — Telehealth: Payer: Self-pay | Admitting: *Deleted

## 2012-03-17 ENCOUNTER — Telehealth: Payer: Self-pay | Admitting: Hematology and Oncology

## 2012-03-17 ENCOUNTER — Ambulatory Visit (HOSPITAL_COMMUNITY)
Admission: RE | Admit: 2012-03-17 | Discharge: 2012-03-17 | Disposition: A | Discharge status: Home / Self Care | Payer: Medicare Other | Source: Ambulatory Visit | Attending: Hematology and Oncology | Admitting: Hematology and Oncology

## 2012-03-17 ENCOUNTER — Other Ambulatory Visit: Payer: Self-pay | Admitting: *Deleted

## 2012-03-17 ENCOUNTER — Other Ambulatory Visit: Payer: Self-pay | Admitting: Hematology and Oncology

## 2012-03-17 DIAGNOSIS — D696 Thrombocytopenia, unspecified: Secondary | ICD-10-CM

## 2012-03-17 DIAGNOSIS — K869 Disease of pancreas, unspecified: Secondary | ICD-10-CM | POA: Insufficient documentation

## 2012-03-17 DIAGNOSIS — M792 Neuralgia and neuritis, unspecified: Secondary | ICD-10-CM

## 2012-03-17 DIAGNOSIS — R161 Splenomegaly, not elsewhere classified: Secondary | ICD-10-CM | POA: Insufficient documentation

## 2012-03-17 DIAGNOSIS — D539 Nutritional anemia, unspecified: Secondary | ICD-10-CM

## 2012-03-17 DIAGNOSIS — D649 Anemia, unspecified: Secondary | ICD-10-CM

## 2012-03-17 LAB — COMPREHENSIVE METABOLIC PANEL
ALT: 10 U/L (ref 0–53)
BUN: 32 mg/dL — ABNORMAL HIGH (ref 6–23)
CO2: 21 mEq/L (ref 19–32)
Calcium: 9.6 mg/dL (ref 8.4–10.5)
Chloride: 108 mEq/L (ref 96–112)
Creatinine, Ser: 1.24 mg/dL (ref 0.50–1.35)
Glucose, Bld: 105 mg/dL — ABNORMAL HIGH (ref 70–99)
Total Bilirubin: 0.7 mg/dL (ref 0.3–1.2)

## 2012-03-17 LAB — VITAMIN B12: Vitamin B-12: 273 pg/mL (ref 211–911)

## 2012-03-17 LAB — HAPTOGLOBIN: Haptoglobin: 175 mg/dL (ref 45–215)

## 2012-03-17 LAB — FOLATE: Folate: >20 ng/mL

## 2012-03-17 LAB — IRON AND TIBC
Iron: 47 ug/dL (ref 42–165)
TIBC: 274 ug/dL (ref 215–435)
UIBC: 227 ug/dL (ref 125–400)

## 2012-03-17 LAB — DIRECT ANTIGLOBULIN TEST (NOT AT ARMC): DAT (Complement): NEGATIVE

## 2012-03-17 NOTE — Telephone Encounter (Signed)
S/w pt wife re ct appt for 4/16 @ [facility]

## 2012-03-17 NOTE — Telephone Encounter (Signed)
THIS REPORT WAS GIVEN TO DR.ODOGWU'S NURSE, THU BRAY.

## 2012-03-17 NOTE — Telephone Encounter (Signed)
Received call from [facility].   Report of Abdominal  US  Left on md's desk for review.

## 2012-03-20 ENCOUNTER — Other Ambulatory Visit (HOSPITAL_BASED_OUTPATIENT_CLINIC_OR_DEPARTMENT_OTHER): Payer: Medicare Other | Admitting: Hematology and Oncology

## 2012-03-20 ENCOUNTER — Other Ambulatory Visit: Payer: Self-pay | Admitting: *Deleted

## 2012-03-20 DIAGNOSIS — D696 Thrombocytopenia, unspecified: Secondary | ICD-10-CM

## 2012-03-20 DIAGNOSIS — D649 Anemia, unspecified: Secondary | ICD-10-CM

## 2012-03-20 LAB — SPEP & IFE WITH QIG
Albumin ELP: 63.2 % (ref 55.8–66.1)
Alpha-1-Globulin: 6.9 % — ABNORMAL HIGH (ref 2.9–4.9)
Alpha-2-Globulin: 13.5 % — ABNORMAL HIGH (ref 7.1–11.8)
Beta 2: 2.8 % — ABNORMAL LOW (ref 3.2–6.5)
Beta Globulin: 5.2 % (ref 4.7–7.2)
Gamma Globulin: 8.4 % — ABNORMAL LOW (ref 11.1–18.8)
IgA: 85 mg/dL (ref 68–379)
IgM, Serum: 28 mg/dL — ABNORMAL LOW (ref 41–251)
Total Protein, Serum Electrophoresis: 6 g/dL (ref 6.0–8.3)

## 2012-03-21 ENCOUNTER — Ambulatory Visit (HOSPITAL_COMMUNITY)
Admission: RE | Admit: 2012-03-21 | Discharge: 2012-03-21 | Disposition: A | Discharge status: Home / Self Care | Payer: Medicare Other | Source: Ambulatory Visit | Attending: Hematology and Oncology | Admitting: Hematology and Oncology

## 2012-03-21 DIAGNOSIS — R599 Enlarged lymph nodes, unspecified: Secondary | ICD-10-CM | POA: Insufficient documentation

## 2012-03-21 DIAGNOSIS — N200 Calculus of kidney: Secondary | ICD-10-CM | POA: Insufficient documentation

## 2012-03-21 DIAGNOSIS — D696 Thrombocytopenia, unspecified: Secondary | ICD-10-CM | POA: Insufficient documentation

## 2012-03-21 DIAGNOSIS — J90 Pleural effusion, not elsewhere classified: Secondary | ICD-10-CM | POA: Insufficient documentation

## 2012-03-21 DIAGNOSIS — R161 Splenomegaly, not elsewhere classified: Secondary | ICD-10-CM | POA: Insufficient documentation

## 2012-03-21 DIAGNOSIS — K5730 Diverticulosis of large intestine without perforation or abscess without bleeding: Secondary | ICD-10-CM | POA: Insufficient documentation

## 2012-03-21 MED ORDER — IOHEXOL 300 MG/ML  SOLN
100.0000 mL | Freq: Once | INTRAMUSCULAR | Status: AC | PRN
  Administered 2012-03-21: 100 mL via INTRAVENOUS

## 2012-03-22 ENCOUNTER — Ambulatory Visit (HOSPITAL_BASED_OUTPATIENT_CLINIC_OR_DEPARTMENT_OTHER): Payer: Medicare Other | Admitting: Hematology and Oncology

## 2012-03-22 ENCOUNTER — Telehealth: Payer: Self-pay | Admitting: *Deleted

## 2012-03-22 ENCOUNTER — Encounter: Payer: Self-pay | Admitting: Hematology and Oncology

## 2012-03-22 ENCOUNTER — Telehealth: Payer: Self-pay | Admitting: Cardiology

## 2012-03-22 ENCOUNTER — Telehealth: Payer: Self-pay | Admitting: Hematology and Oncology

## 2012-03-22 VITALS — BP 124/59 | HR 58 | Temp 97.2°F | Ht 73.0 in | Wt 198.8 lb

## 2012-03-22 DIAGNOSIS — D649 Anemia, unspecified: Secondary | ICD-10-CM

## 2012-03-22 DIAGNOSIS — R161 Splenomegaly, not elsewhere classified: Secondary | ICD-10-CM

## 2012-03-22 DIAGNOSIS — D6959 Other secondary thrombocytopenia: Secondary | ICD-10-CM

## 2012-03-22 DIAGNOSIS — D696 Thrombocytopenia, unspecified: Secondary | ICD-10-CM

## 2012-03-22 DIAGNOSIS — D539 Nutritional anemia, unspecified: Secondary | ICD-10-CM

## 2012-03-22 DIAGNOSIS — R599 Enlarged lymph nodes, unspecified: Secondary | ICD-10-CM

## 2012-03-22 DIAGNOSIS — R591 Generalized enlarged lymph nodes: Secondary | ICD-10-CM

## 2012-03-22 NOTE — Patient Instructions (Signed)
Patient to follow up as instructed.   Current Outpatient Prescriptions  Medication Sig Dispense Refill  . aspirin EC 81 MG tablet Take 81 mg by mouth daily.      . clopidogrel (PLAVIX) 75 MG tablet Take 1 tablet (75 mg total) by mouth daily with breakfast.  90 tablet  3  . furosemide (LASIX) 40 MG tablet Take 20 mg by mouth as needed.       . gabapentin (NEURONTIN) 300 MG capsule Take 300 mg by mouth 3 (three) times daily. For shingles pain      . HYDROcodone-acetaminophen (NORCO) 5-325 MG per tablet Take 1 tablet by mouth 3 (three) times daily as needed. Pt stated he tries to take as scheduled for shingles pain      . iron polysaccharides (NIFEREX) 150 MG capsule Take 150 mg by mouth daily.      . isosorbide mononitrate (IMDUR) 30 MG 24 hr tablet Take 30 mg by mouth daily.      Marland Kitchen losartan (COZAAR) 100 MG tablet Take 1 tablet by mouth Daily.      . meloxicam (MOBIC) 15 MG tablet Take 15 mg by mouth daily.      . metoprolol succinate (TOPROL-XL) 25 MG 24 hr tablet Take 25 mg by mouth daily.       . nitroGLYCERIN (NITROSTAT) 0.4 MG SL tablet Place 0.4 mg under the tongue every 5 (five) minutes as needed. For chest pain.      . pravastatin (PRAVACHOL) 40 MG tablet Take 40 mg by mouth daily.      . metFORMIN (GLUCOPHAGE) 500 MG tablet Take 1 tablet (500 mg total) by mouth 2 (two) times daily with a meal.       No current facility-administered medications for this visit.   Facility-Administered Medications Ordered in Other Visits  Medication Dose Route Frequency Provider Last Rate Last Dose  . iohexol (OMNIPAQUE) 300 MG/ML solution 100 mL  100 mL Intravenous Once PRN Medication Radiologist, MD   100 mL at 03/21/12 1541        April 2013  Sunday Monday Tuesday Wednesday Thursday Friday Saturday      1   2   3   4   5   6    7   8   9   10   11    FINANCIAL COUNSELING   9:30 AM  (30 min.)  [facility]  [facility]  [facility]   NEW PATIENT 60  10:00 AM  (60 min.)  Laurice Record, MD  [facility]   LAB ADDON  11:15 AM  (15 min.)  Krista Blue  [facility] 12   US ABDOMEN COMPLETE   9:30 AM  (30 min.)  [facility]Us 1  [facility] 13    14   15   16    CT BODY W/   3:30 PM  (30 min.)  [facility]Ct 1  [facility] 17   EST PT 30   1:30 PM  (30 min.)  Laurice Record, MD  [facility] 18   19   20    21   22   23   24   25   26   27    28   29    30

## 2012-03-22 NOTE — Progress Notes (Signed)
CC:   Vania Rea. Jarold Motto, MD, Clementeen Graham, FACP, FAGA Jesse Sans. Wall, MD, FACC  IDENTIFYING STATEMENT:  The patient is an 78 year old man who presents to discuss results.  INTERVAL HISTORY:  In summary, the patient was referred for anemia evaluation.  He has coronary disease status post a CABG and has also had a recent angioplasty with stent placement in February 2013.  He is on Plavix.  He has required 2 units of packed RBCs for anemia two months ago.  He denied history of overt blood loss.  He denied fever, chills, night sweats.  Despite mild anorexia, he has not lost any weight. Results are as follows:  03/20/2012:  White cell count 3.7, hemoglobin 8.6, hematocrit 27.4, platelets 65 (102).  Morphology noted moderate polychromasia with ovalocytes and a few acanthocytes and teardrops cells.  BUN and creatinine were 32 and 1.24, respectively.  Vitamin B12 was low normal at 273.  LDH was not elevated at 231 and haptoglobin was normal at 175. Ferritin was 127; iron 47; TIBC 274; and saturation 17%.  Serum folate was greater than 20.  Serum protein electrophoresis did not reveal an M spike.  IgG was 531, IgA 85 and IgM 28.  An ultrasound of the abdomen on 03/17/2012 revealed an unremarkable liver with no focal lesions.  The pancreas showed no focal abnormality; however, there were complex masses near the pancreatic head and porta hepatitis encompassing 6.7 x 3.6 x 3.9 cm.  Additional lesions were noted at the porta hepatis.  The spleen was enlarged, measuring 17 cm in length, with no focal splenic lesions.  The patient was then referred for a CT scan of the abdomen and pelvis on 03/21/2012.  The CT scan showed multiple enlarged periportal lymph nodes and peripancreatic nodes.  There was also enlarged gastrohepatic ligament.  There were multiple mildly-enlarged retroperitoneal lymph nodes.  The spleen was enlarged, measuring 18 cm.  There was no peritoneal disease.  There were also enlarged left  and right external iliac lymph nodes.  Review of the bone windows showed no aggressive osseous lesions.  MEDICATIONS:  Reviewed and updated.  Past medical history, family history, social history unchanged.  REVIEW OF SYSTEMS:  Does have pain from postherpetic neuralgia and also describes a 30-pound weight loss as a result of pain.  Denies fever, chills, night sweats.  Denies adenopathy.  PHYSICAL EXAMINATION:  The patient is alert and oriented x3.  Vitals: Pulse 58, blood pressure 124/59, temperature 97.2, respirations 18. Weight 198 pounds.  HEENT:  Head is atraumatic, normocephalic.  Sclerae anicteric.  Mouth moist.  Chest:  Clear.  Abdomen:  Soft, nontender. Bowel sounds present.  Lymph nodes:  No adenopathy.  Extremities:  Plus edema.  LABORATORY DATA:  As above.  In addition, urinalysis showed trace blood and bacteria.  Sodium 138, potassium 4.7, chloride 108, CO2 21, glucose 105, total bilirubin 0.7, alkaline phosphatase 122, AST 21, ALT 10, calcium 9.6.  Fecal occult blood testing was negative x3.  IMPRESSION AND PLAN:  Mr. Feldhaus is an 78 year old man with: 1. Anemia with adequate iron stores and no evidence of overt blood     loss.  It is likely anemia of chronic disease. 2. Thrombocytopenia secondary to splenomegaly. 3. New finding of adenopathy noted on recent scans.  The patient needs     to undergo a tissue diagnosis and has agreed to do so.  We will see     if Interventional Radiology can perform a CT-guided core biopsy.  In addition, have him complete the staging with CT of the chest and     a PET scan.  He needs to be off Plavix for some time, but I will     defer this to his cardiologist.  The patient and his     wife understand the plan, and they return to discuss results.    ______________________________ Laurice Record, M.D. LIO/MEDQ  D:  03/22/2012  T:  03/22/2012  Job:  [id number]

## 2012-03-22 NOTE — Progress Notes (Signed)
This office note has been dictated.

## 2012-03-22 NOTE — Telephone Encounter (Signed)
Dr. Excell Seltzer reviewed. Pt can stop Plavix 5 days prior to biopsy. Mylo Red RN

## 2012-03-22 NOTE — Telephone Encounter (Signed)
Received call from Tiffany, radiology scheduler re:  Pt is on Plavix.  Radiologist would like for pt to be off Plavix for 5 days prior to biopsy.    Radiologist would like to have order for pt to hold Plavix as indicated. Tiffany's   Phone     [phone].

## 2012-03-22 NOTE — Telephone Encounter (Signed)
Called Dr. Valera Castle,  Cardiologist  And left message with nurse for clarification whether pt could be off Plavix  For 5 days prior to biospy procedure.   Await for response from nurse and md. Dr.  Daleen Squibb   Phone       [phone].

## 2012-03-22 NOTE — Telephone Encounter (Signed)
gv pt/wife appts for pet/ct 4/23 @ [facility]. Pt will be contacted re bx appt and he/wife is aware. S/w Alisha in central re bx order - central will contact pt. Per LO no f/u right now.

## 2012-03-22 NOTE — Telephone Encounter (Signed)
New problem:  Per Jason Fila, patient  need to be  Off plavix 5 day prior. Due to biposy.

## 2012-03-23 ENCOUNTER — Telehealth: Payer: Self-pay | Admitting: *Deleted

## 2012-03-23 ENCOUNTER — Other Ambulatory Visit: Payer: Self-pay | Admitting: Radiology

## 2012-03-23 NOTE — Telephone Encounter (Signed)
Spoke with Tiffany in radiology.   Informed her that response from pt's cardiologist regarding stopping Plavix  For 5  Days prior to biopsy was in EPIC.   Tiffany stated she would relay message to radiologist. Tiffany's   Phone    [phone].

## 2012-03-23 NOTE — Telephone Encounter (Signed)
Received call from Tiffany in radiology re:  Pt is scheduled for  Biopsy on  Wed.  03/29/12  At 0900.    Tiffany stated she had informed pt of appt date and time along with instructions including :  Stop  Plavix  From 4/19  -  4/23  For biopsy on 03/29/12.

## 2012-03-24 ENCOUNTER — Encounter (HOSPITAL_COMMUNITY): Payer: Self-pay | Admitting: Pharmacy Technician

## 2012-03-24 ENCOUNTER — Other Ambulatory Visit: Payer: Self-pay | Admitting: *Deleted

## 2012-03-28 ENCOUNTER — Telehealth: Payer: Self-pay | Admitting: Hematology and Oncology

## 2012-03-28 ENCOUNTER — Encounter (HOSPITAL_COMMUNITY)
Admission: RE | Admit: 2012-03-28 | Discharge: 2012-03-28 | Disposition: A | Discharge status: Home / Self Care | Payer: Medicare Other | Source: Ambulatory Visit | Attending: Hematology and Oncology | Admitting: Hematology and Oncology

## 2012-03-28 ENCOUNTER — Other Ambulatory Visit: Payer: Self-pay | Admitting: *Deleted

## 2012-03-28 DIAGNOSIS — D539 Nutritional anemia, unspecified: Secondary | ICD-10-CM

## 2012-03-28 DIAGNOSIS — R591 Generalized enlarged lymph nodes: Secondary | ICD-10-CM

## 2012-03-28 DIAGNOSIS — R188 Other ascites: Secondary | ICD-10-CM | POA: Insufficient documentation

## 2012-03-28 DIAGNOSIS — D696 Thrombocytopenia, unspecified: Secondary | ICD-10-CM

## 2012-03-28 DIAGNOSIS — R161 Splenomegaly, not elsewhere classified: Secondary | ICD-10-CM | POA: Insufficient documentation

## 2012-03-28 DIAGNOSIS — I517 Cardiomegaly: Secondary | ICD-10-CM | POA: Insufficient documentation

## 2012-03-28 DIAGNOSIS — R599 Enlarged lymph nodes, unspecified: Secondary | ICD-10-CM | POA: Insufficient documentation

## 2012-03-28 DIAGNOSIS — C8589 Other specified types of non-Hodgkin lymphoma, extranodal and solid organ sites: Secondary | ICD-10-CM | POA: Insufficient documentation

## 2012-03-28 DIAGNOSIS — J90 Pleural effusion, not elsewhere classified: Secondary | ICD-10-CM | POA: Insufficient documentation

## 2012-03-28 DIAGNOSIS — Z951 Presence of aortocoronary bypass graft: Secondary | ICD-10-CM | POA: Insufficient documentation

## 2012-03-28 DIAGNOSIS — M799 Soft tissue disorder, unspecified: Secondary | ICD-10-CM | POA: Insufficient documentation

## 2012-03-28 MED ORDER — FLUDEOXYGLUCOSE F - 18 (FDG) INJECTION: 17.4000 | Freq: Once | INTRAVENOUS | Status: AC | PRN

## 2012-03-28 MED ORDER — FLUDEOXYGLUCOSE F - 18 (FDG) INJECTION
17.4000 | Freq: Once | INTRAVENOUS | Status: AC | PRN
  Administered 2012-03-28: 17.4 via INTRAVENOUS

## 2012-03-28 MED ORDER — IOHEXOL 300 MG/ML  SOLN
80.0000 mL | Freq: Once | INTRAMUSCULAR | Status: AC | PRN
  Administered 2012-03-28: 80 mL via INTRAVENOUS

## 2012-03-28 NOTE — Telephone Encounter (Signed)
lmonvm for pt re appt for 5/3 and mailed schedule.

## 2012-03-29 ENCOUNTER — Ambulatory Visit (HOSPITAL_COMMUNITY)
Admission: RE | Admit: 2012-03-29 | Discharge: 2012-03-29 | Disposition: A | Discharge status: Home / Self Care | Payer: Medicare Other | Source: Ambulatory Visit | Attending: Hematology and Oncology | Admitting: Hematology and Oncology

## 2012-03-29 ENCOUNTER — Encounter (HOSPITAL_COMMUNITY): Payer: Self-pay

## 2012-03-29 VITALS — BP 132/51 | HR 54 | Temp 98.0°F | Resp 14

## 2012-03-29 DIAGNOSIS — I10 Essential (primary) hypertension: Secondary | ICD-10-CM | POA: Insufficient documentation

## 2012-03-29 DIAGNOSIS — E119 Type 2 diabetes mellitus without complications: Secondary | ICD-10-CM | POA: Insufficient documentation

## 2012-03-29 DIAGNOSIS — D539 Nutritional anemia, unspecified: Secondary | ICD-10-CM | POA: Insufficient documentation

## 2012-03-29 DIAGNOSIS — R599 Enlarged lymph nodes, unspecified: Secondary | ICD-10-CM | POA: Insufficient documentation

## 2012-03-29 DIAGNOSIS — Z7902 Long term (current) use of antithrombotics/antiplatelets: Secondary | ICD-10-CM | POA: Insufficient documentation

## 2012-03-29 DIAGNOSIS — D696 Thrombocytopenia, unspecified: Secondary | ICD-10-CM | POA: Insufficient documentation

## 2012-03-29 DIAGNOSIS — R591 Generalized enlarged lymph nodes: Secondary | ICD-10-CM

## 2012-03-29 DIAGNOSIS — I2510 Atherosclerotic heart disease of native coronary artery without angina pectoris: Secondary | ICD-10-CM | POA: Insufficient documentation

## 2012-03-29 DIAGNOSIS — D649 Anemia, unspecified: Secondary | ICD-10-CM | POA: Insufficient documentation

## 2012-03-29 DIAGNOSIS — Z951 Presence of aortocoronary bypass graft: Secondary | ICD-10-CM | POA: Insufficient documentation

## 2012-03-29 LAB — PROTIME-INR: INR: 1.05 (ref 0.00–1.49)

## 2012-03-29 LAB — CBC
HCT: 29.9 % — ABNORMAL LOW (ref 39.0–52.0)
MCV: 95.2 fL (ref 78.0–100.0)
Platelets: 67 10*3/uL — ABNORMAL LOW (ref 150–400)
RBC: 3.14 MIL/uL — ABNORMAL LOW (ref 4.22–5.81)
RDW: 18.9 % — ABNORMAL HIGH (ref 11.5–15.5)
WBC: 4.1 10*3/uL (ref 4.0–10.5)

## 2012-03-29 LAB — GLUCOSE, CAPILLARY: Glucose-Capillary: 128 mg/dL — ABNORMAL HIGH (ref 70–99)

## 2012-03-29 LAB — APTT: aPTT: 32 seconds (ref 24–37)

## 2012-03-29 MED ORDER — MIDAZOLAM HCL 2 MG/2ML IJ SOLN
INTRAMUSCULAR | Status: AC
  Filled 2012-03-29: qty 4

## 2012-03-29 MED ORDER — SODIUM CHLORIDE 0.9 % IV SOLN
Freq: Once | INTRAVENOUS | Status: AC
  Administered 2012-03-29: 08:00:00 via INTRAVENOUS

## 2012-03-29 MED ORDER — FENTANYL CITRATE 0.05 MG/ML IJ SOLN
INTRAMUSCULAR | Status: AC
  Filled 2012-03-29: qty 4

## 2012-03-29 MED ORDER — MIDAZOLAM HCL 5 MG/5ML IJ SOLN
INTRAMUSCULAR | Status: AC | PRN
  Administered 2012-03-29: 2 mg via INTRAVENOUS

## 2012-03-29 MED ORDER — FENTANYL CITRATE 0.05 MG/ML IJ SOLN
INTRAMUSCULAR | Status: AC
  Filled 2012-03-29: qty 2

## 2012-03-29 MED ORDER — FENTANYL CITRATE 0.05 MG/ML IJ SOLN
INTRAMUSCULAR | Status: AC | PRN
  Administered 2012-03-29: 100 ug via INTRAVENOUS

## 2012-03-29 NOTE — Procedures (Signed)
CT core R ext iliac LN 18g core x4 No complication No blood loss. See complete dictation in [facility].

## 2012-03-29 NOTE — Discharge Instructions (Signed)
Biopsy  Care After  Refer to this sheet in the next few weeks. These instructions provide you with information on caring for yourself after your procedure. Your caregiver may also give you more specific instructions. Your treatment has been planned according to current medical practices, but problems sometimes occur. Call your caregiver if you have any problems or questions after your procedure.  If you had a fine needle biopsy, you may have soreness at the biopsy site for 1 to 2 days. If you had an open biopsy, you may have soreness at the biopsy site for 3 to 4 days.  HOME CARE INSTRUCTIONS    You may resume normal diet and activities as directed.   Change bandages (dressings) as directed. If your wound was closed with a skin glue (adhesive), it will wear off and begin to peel in 7 days.   Only take over-the-counter or prescription medicines for pain, discomfort, or fever as directed by your caregiver.   Ask your caregiver when you can bathe and get your wound wet.  SEEK IMMEDIATE MEDICAL CARE IF:    You have increased bleeding (more than a small spot) from the biopsy site.   You notice redness, swelling, or increasing pain at the biopsy site.   You have pus coming from the biopsy site.   You have a fever.   You notice a bad smell coming from the biopsy site or dressing.   You have a rash, have difficulty breathing, or have any allergic problems.  MAKE SURE YOU:    Understand these instructions.   Will watch your condition.   Will get help right away if you are not doing well or get worse.  Document Released: 06/11/2005 Document Revised: 11/11/2011 Document Reviewed: 05/20/2011  [employer] Patient Information 2012 [employer].

## 2012-03-29 NOTE — H&P (Signed)
[name] is an 78 y.o. male.   Chief Complaint: Anemia and lymphadenopathy HPI: Pt referred for CT guided LN bx for anemia and new finding of lymphadenopathy. Has had CT/PET which shows several areas of lymphadenopathy, particularly the Rt iliac chain.  He also has CAD with recent coronary stent placement in February. He has been on Plavix since then but has stopped this as of 4/19 for this procedure.  Past Medical History  Diagnosis Date  . CAD (coronary artery disease)     a. s/p CABG 1995;  b. NSTEMI & subsequent BMS to SVG->right PDA 02/03/12.;  c. Cath 02/14/2012 3vd with 4/4 patent grafts and patent stent in vg->pda.  . Cerebrovascular disease, unspecified   . Unspecified essential hypertension   . Other and unspecified hyperlipidemia   . Type II or unspecified type diabetes mellitus without mention of complication, not stated as uncontrolled   . Osteoarthritis (arthritis due to wear and tear of joints)   . Asthmatic bronchitis   . BPH (benign prostatic hypertrophy)   . Herpes zoster   . Arthritis   . Anemia   . Thrombocytopenia   . Aortic stenosis, moderate     a. Echo 02/14/12 EF 55%, Gr 2 DD, Mod AS (mean grad: 29, peak grad 52)    Past Surgical History  Procedure Date  . Coronary artery bypass graft 1995  . Rotator cuff repair   . Carpal tunnel release   . Incisional hernia repair   . Kidney surgery   . Back surgery     Family History  Problem Relation Age of Onset  . Coronary artery disease    . Alcohol abuse     Social History:  reports that he quit smoking about 27 years ago. His smoking use included Cigarettes and Pipe. He has a 30 pack-year smoking history. He has never used smokeless tobacco. He reports that he does not drink alcohol or use illicit drugs.  Allergies:  Allergies  Allergen Reactions  . Azithromycin Itching  . Macrodantin Itching  . Minocin Itching  . Minocycline Hcl Itching  . Nitrofurantoin Itching  . Zithromax (Azithromycin  Dihydrate) Itching     (Not in a hospital admission)  Results for orders placed during the hospital encounter of 03/29/12 (from the past 48 hour(s))  APTT     Status: Normal   Collection Time   03/29/12  7:50 AM      Component Value Range Comment   aPTT 32  24 - 37 (seconds)   CBC     Status: Abnormal (Preliminary result)   Collection Time   03/29/12  7:50 AM      Component Value Range Comment   WBC 4.1  4.0 - 10.5 (K/uL)    RBC 3.14 (*) 4.22 - 5.81 (MIL/uL)    Hemoglobin 9.5 (*) 13.0 - 17.0 (g/dL)    HCT 08.6 (*) 57.8 - 52.0 (%)    MCV 95.2  78.0 - 100.0 (fL)    MCH 30.3  26.0 - 34.0 (pg)    MCHC 31.8  30.0 - 36.0 (g/dL)    RDW 46.9 (*) 62.9 - 15.5 (%)    Platelets PENDING  150 - 400 (K/uL)   PROTIME-INR     Status: Normal   Collection Time   03/29/12  7:50 AM      Component Value Range Comment   Prothrombin Time 13.9  11.6 - 15.2 (seconds)    INR 1.05  0.00 - 1.49  Ct Chest W Contrast  03/28/2012  *RADIOLOGY REPORT*  Clinical Data: Patient with abdominal adenopathy.  CT CHEST WITH CONTRAST  Technique:  Multidetector CT imaging of the chest was performed following the standard protocol during bolus administration of intravenous contrast.  Contrast: 80mL OMNIPAQUE IOHEXOL 300 MG/ML  SOLN  Comparison: None  Findings: Multiple borderline enlarged axillary adenopathy identified.  Lymph node in the left axilla measures 10.4 mm, image 13.  There is a right axillary lymph node which measures 10.5 mm, image 13.  Left supraclavicular lymph node is identified with a short axis dimension of 1.4 cm, image 7.  No mediastinal or hilar adenopathy.  No pericardial effusion.  The patient is status post median sternotomy and CABG procedure. There are bilateral pleural effusions present.  No airspace consolidation identified.  Noncalcified nodule in the superior segment of right lower lobe measures 0.8 cm, image 32. Review of the visualized osseous structures is significant for mild multilevel  degenerative disc disease.  Limited imaging through the upper abdomen again demonstrates bulky porta hepatic and gastrohepatic ligament adenopathy.  Ascites is noted within the upper abdomen.  There is a soft tissue attenuating nodule along the undersurface of the ventral abdominal wall which measures 2.5 x 1.2 cm, image 61.  IMPRESSION: 1.  Borderline bilateral axillary and left supraclavicular adenopathy. 2.  Cardiac enlargement and pleural effusions suspicious for CHF. 3.  Noncalcified nodule in the right lower lobe measuring 8 mm. If the patient is at high risk for bronchogenic carcinoma, follow-up chest CT at 3-6 months is recommended.  If the patient is at low risk for bronchogenic carcinoma, follow-up chest CT at 6-12 months is recommended.  This recommendation follows the consensus statement: Guidelines for Management of Small Pulmonary Nodules Detected on CT Scans: A Statement from the Fleischner Society as published in Radiology 2005; 237:395-400. 4.   Upper abdominal adenopathy. 5.  Ascites and soft tissue attenuating nodule along the undersurface of the ventral abdominal wall may indicate transperitoneal spread of disease.  Original Report Authenticated By: Rosealee Albee, M.D.   Nm Pet Image Initial (pi) Skull Base To Thigh  03/28/2012  **ADDENDUM** CREATED: 03/28/2012 09:56:31  Not mentioned in the body of the report is a soft tissue attenuating nodule along the undersurface of the ventral abdominal wall.  This measures 2.0 by 1.2 cm.  The SUV max associated with this nodule is equal to 2.5.  Together with the abdominal and pelvic ascites may indicate transperitoneal spread of tumor.  **END ADDENDUM** SIGNED BY: Rosealee Albee, M.D.    03/28/2012  *RADIOLOGY REPORT*  Clinical Data: Initial treatment strategy for lymphoma.  NUCLEAR MEDICINE PET SKULL BASE TO THIGH  Fasting Blood Glucose:  125  Technique:  17.4 mCi F-18 FDG was injected intravenously. CT data was obtained and used for attenuation  correction and anatomic localization only.  (This was not acquired as a diagnostic CT examination.) Additional exam technical data entered on technologist worksheet.  Comparison:  03/21/2012  Findings:  Neck: No hypermetabolic lymph nodes in the neck.  Chest:  There is mild to moderate increased uptake within bilateral axillary lymph nodes.  Within the left axilla there is a lymph node measuring 1.1 cm, image 68.  This has an SUV max equal to the 3.9. Within the right axilla there is a lymph node measuring 1.3 cm with an SUV max equal to 3.1.  No hypermetabolic or enlarged mediastinal lymph nodes.  The heart size is enlarged status post median sternotomy and  CABG procedure.  There are bilateral pleural effusions present.  Small nodule within the superior segment of the right lower lobe measures 0.7 cm, image 89.  This is too small to characterize.  Abdomen/Pelvis:  There is diffuse increased uptake throughout the enlarged spleen. The spleen measures 17 cm in craniocaudal dimension.  Extensive upper abdominal adenopathy is again noted.  Within the porta hepatic and peripancreatic region there is a cluster of enlarged lymph nodes which measure 7.4 x 6.2 cm, image 150.  The SUV max within this region is equal to approximately 5.3.  Enlarged gastrohepatic ligament lymph node is identified measuring 2.2 cm.  The SUV max associated with this lymph node is equal to the 4.2.  There are multiple left-sided nonobstructing calculi.  Borderline enlarged retroperitoneal lymph nodes are noted exhibiting low level FDG uptake.  Bilateral iliac adenopathy is identified which exhibits moderate increased FDG uptake.  Right external iliac lymph node measures 1.8 cm and has an SUV max equal to the 6.3.  [place]:  Heterogeneous tracer activity is identified without throughout the axial and appendicular skeleton without focality. Bone marrow involvement cannot be excluded.  No pathologic fractures or aggressive bone lesions are noted on  the CT images.  IMPRESSION:  1.  Hypermetabolic adenopathy is identified within the chest abdomen and pelvis compatible with lymphoma. 2.  Splenomegaly with diffuse uniform radiotracer uptake throughout the splenic parenchyma. Original Report Authenticated By: Rosealee Albee, M.D.    Review of Systems  Constitutional: Negative for fever and chills.  Eyes: Negative for blurred vision.  Respiratory: Negative for cough, shortness of breath and wheezing.   Cardiovascular: Negative for chest pain, palpitations, claudication and leg swelling.  Gastrointestinal: Negative for nausea, vomiting, abdominal pain, diarrhea and constipation.  Genitourinary: Negative for dysuria.  Musculoskeletal: Negative for myalgias and back pain.  Neurological: Negative for headaches.  Psychiatric/Behavioral: Negative for depression. The patient is not nervous/anxious and does not have insomnia.     Blood pressure 165/60, pulse 63, temperature 98 F (36.7 C), temperature source Oral, resp. rate 18, SpO2 97.00%. Physical Exam  Constitutional: He is oriented to person, place, and time. He appears well-developed and well-nourished. No distress.  HENT:  Head: Normocephalic and atraumatic.  Neck: Normal range of motion. Neck supple. No tracheal deviation present. No thyromegaly present.  Cardiovascular: Normal rate.   Murmur heard. Respiratory: Effort normal and breath sounds normal. No respiratory distress. He has no wheezes. He has no rales. He exhibits no tenderness.  GI: Soft. Bowel sounds are normal. He exhibits no distension. There is no tenderness.  Lymphadenopathy:    He has no cervical adenopathy.  Neurological: He is alert and oriented to person, place, and time. No cranial nerve deficit.  Skin: Skin is warm and dry. No rash noted.  Psychiatric: He has a normal mood and affect. His behavior is normal. Judgment and thought content normal.     Assessment/Plan Lymphadenopathy Anemia Thrombocytopenia For  CT guided LN bx today, Rt iliac chain Procedure including risks and complications discussed with pt and wife. Consent signed in chart. Labs stable, except PLTs pending  Josehua Hammar PA-C 03/29/2012, 8:20 AM

## 2012-03-29 NOTE — ED Notes (Signed)
Patient is resting comfortably. 

## 2012-04-07 ENCOUNTER — Encounter: Payer: Self-pay | Admitting: Hematology and Oncology

## 2012-04-07 ENCOUNTER — Ambulatory Visit (HOSPITAL_BASED_OUTPATIENT_CLINIC_OR_DEPARTMENT_OTHER): Payer: Medicare Other | Admitting: Hematology and Oncology

## 2012-04-07 VITALS — BP 136/74 | HR 74 | Temp 98.0°F | Ht 73.0 in | Wt 201.9 lb

## 2012-04-07 DIAGNOSIS — C8590 Non-Hodgkin lymphoma, unspecified, unspecified site: Secondary | ICD-10-CM

## 2012-04-07 DIAGNOSIS — C8589 Other specified types of non-Hodgkin lymphoma, extranodal and solid organ sites: Secondary | ICD-10-CM

## 2012-04-07 NOTE — Progress Notes (Signed)
This office note has been dictated.

## 2012-04-07 NOTE — Progress Notes (Signed)
CC:   [name]. Richard Motto, MD, Clementeen Graham, FACP, FAGA Jesse Sans. Wall, MD, FACC  IDENTIFYING STATEMENT:  The patient is an 78 year old man who returns with his wife to discuss additional results.  INTERVAL HISTORY:  Briefly, [name] is a pleasant gentleman who was referred for anemia evaluation.  He has history of coronary artery disease and has had a CABG and also recent angioplasty with stent placement in February of this year.  For his anemia he required packed red blood cells at the time of his recent angioplasty.  Besides trigeminal neuralgia secondary to shingles he felt relatively well.  He denied overt blood loss.  He denied GI symptoms.  Subsequent workup had shown a hemoglobin and hematocrit of 8.6 and 27.4 respectively.  His platelets had fallen to 65,000.  LDH was not elevated at 231.  He had adequate iron levels with ferritin 127, iron 47, TIBC 274 and saturation 17%.  SPEP did not reveal an M spike and quantitative IgG, IgA and IgM were 531, 85 and 28 respectively.  The patient then went on to receive an ultrasound of the abdomen on 03/17/2012 that revealed an enlarged spleen with complex masses near the pancreatic head and porta hepatis measuring 6.7 x 3.6 x 3.9 cm.  A PET scan on 03/28/2012 had showed no hypermetabolic lymph nodes in the neck. There was mild to moderate increased uptake in the axillary lymph nodes bilaterally.  There were no hypermetabolic or enlarged mediastinal nodes.  Heart size was enlarged.  Bilateral pleural effusions were present.  The abdomen and pelvis showed diffuse uptake throughout the enlarged spleen with the spleen measuring 17 cm.  There was extensive upper abdominal adenopathy within the porta hepatic and peripancreatic region.  The SUV within this region was approximately 5.3.  Borderline enlarged retroperitoneal nodes were noted exhibiting low level FDG uptake.  Bilateral iliac adenopathy was also identified exhibiting moderate increased  FDG uptake with an SUV of 6.3.  The bony skeleton showed heterogeneous trace activity with no pathologic fracture or clear aggressive bone lesions noted.  The patient declined a bone marrow biopsy but was agreeable for soft tissue needle core biopsy of a right neck lymph node.  Surgical pathology was consistent with a non-Hodgkin's B-cell lymphoma consistent with mantle cell lymphoma. Immunohistochemical stains were positive for CD 20 and CD 79A with correct expression of CD 43 and CD 5, second D1 was also positive.  MEDICATIONS:  Reviewed and updated.  ALLERGIES:  Zithromax, nitrofurantoin, minocycline, Macrodantin, CT dye.  PHYSICAL EXAMINATION:  General:  The patient is alert and oriented x3. Vitals:  Pulse 74, blood pressure 136/74, temperature 98, respirations 22, weight is 201 pounds.  HEENT:  Head is atraumatic.  Sclerae anicteric.  Right side of face notes skin changes consistent with post herpetic neuralgia involving right ear extending to right jaw.  Mouth no thrush.  Neck:  Supple.  Chest:  Clear.  There is a median sternotomy scar.  CVS:  First and second heart sounds present.  Abdomen:  Soft. Spleen tip palpable, nontender.  Bowel sounds present.  Lymph nodes:  No cervical adenopathy.  Probable shotty mediastinal adenopathy. Extremities:  Trace edema bilaterally.  IMPRESSION AND PLAN:  [name] is an 78 year old gentleman recently diagnosed with non-Hodgkin's B-cell lymphoma consistent with a mantle cell lymphoma.  He is declining a bone marrow evaluation.  Dr. Cyndie Chime has kindly agreed to see the patient.  He will be contacting the patient for an appointment next week.  ______________________________ Laurice Record, M.D. LIO/MEDQ  D:  04/07/2012  T:  04/07/2012  Job:  [id number]

## 2012-04-07 NOTE — Patient Instructions (Signed)
[name]  [id number]  [facility] Discharge Instructions  RECOMMENDATIONS MADE BY THE CONSULTANT AND ANY TEST RESULTS WILL BE SENT TO YOUR REFERRING DOCTOR.   EXAM FINDINGS BY MD TODAY AND SIGNS AND SYMPTOMS TO REPORT TO CLINIC OR PRIMARY MD:   Your current list of medications are: Current Outpatient Prescriptions  Medication Sig Dispense Refill  . aspirin EC 81 MG tablet Take 81 mg by mouth every morning.       . clopidogrel (PLAVIX) 75 MG tablet Take 1 tablet (75 mg total) by mouth daily with breakfast.  90 tablet  3  . furosemide (LASIX) 40 MG tablet Take 20 mg by mouth daily as needed. Fluid       . gabapentin (NEURONTIN) 300 MG capsule Take 300 mg by mouth 3 (three) times daily. For shingles pain      . HYDROcodone-acetaminophen (NORCO) 5-325 MG per tablet Take 1 tablet by mouth 3 (three) times daily as needed. Pt stated he tries to take as scheduled for shingles pain      . isosorbide mononitrate (IMDUR) 30 MG 24 hr tablet Take 30 mg by mouth daily.      Marland Kitchen losartan (COZAAR) 100 MG tablet Take 1 tablet by mouth Daily.      . meloxicam (MOBIC) 15 MG tablet Take 15 mg by mouth daily.      . metFORMIN (GLUCOPHAGE) 500 MG tablet Take 1 tablet (500 mg total) by mouth 2 (two) times daily with a meal.      . metoprolol succinate (TOPROL-XL) 25 MG 24 hr tablet Take 25 mg by mouth every morning.       . nitroGLYCERIN (NITROSTAT) 0.4 MG SL tablet Place 0.4 mg under the tongue every 5 (five) minutes as needed. For chest pain.      . pravastatin (PRAVACHOL) 40 MG tablet Take 40 mg by mouth daily.         INSTRUCTIONS GIVEN AND DISCUSSED:   SPECIAL INSTRUCTIONS/FOLLOW-UP:  See above.  I acknowledge that I have been informed and understand all the instructions given to me and received a copy. I do not have any more questions at this time, but understand that I may call the [facility] at [phone] during business hours should I have any further  questions or need assistance in obtaining follow-up care.

## 2012-04-10 ENCOUNTER — Other Ambulatory Visit: Payer: Self-pay | Admitting: Oncology

## 2012-04-11 ENCOUNTER — Telehealth: Payer: Self-pay | Admitting: Oncology

## 2012-04-11 NOTE — Telephone Encounter (Signed)
Talked to pt's wife gave her appt time and date, she is aware of location

## 2012-04-13 ENCOUNTER — Telehealth: Payer: Self-pay | Admitting: Oncology

## 2012-04-13 ENCOUNTER — Encounter: Payer: Self-pay | Admitting: Oncology

## 2012-04-13 ENCOUNTER — Ambulatory Visit: Payer: Medicare Other

## 2012-04-13 ENCOUNTER — Ambulatory Visit (HOSPITAL_BASED_OUTPATIENT_CLINIC_OR_DEPARTMENT_OTHER): Payer: Medicare Other | Admitting: Oncology

## 2012-04-13 ENCOUNTER — Ambulatory Visit (HOSPITAL_BASED_OUTPATIENT_CLINIC_OR_DEPARTMENT_OTHER): Payer: Medicare Other

## 2012-04-13 ENCOUNTER — Other Ambulatory Visit: Payer: Medicare Other | Admitting: Lab

## 2012-04-13 VITALS — BP 149/60 | HR 73 | Temp 98.1°F | Ht 73.0 in | Wt 206.4 lb

## 2012-04-13 DIAGNOSIS — Z9289 Personal history of other medical treatment: Secondary | ICD-10-CM

## 2012-04-13 DIAGNOSIS — E119 Type 2 diabetes mellitus without complications: Secondary | ICD-10-CM

## 2012-04-13 DIAGNOSIS — C8589 Other specified types of non-Hodgkin lymphoma, extranodal and solid organ sites: Secondary | ICD-10-CM

## 2012-04-13 DIAGNOSIS — C8590 Non-Hodgkin lymphoma, unspecified, unspecified site: Secondary | ICD-10-CM

## 2012-04-13 DIAGNOSIS — D696 Thrombocytopenia, unspecified: Secondary | ICD-10-CM

## 2012-04-13 DIAGNOSIS — C8319 Mantle cell lymphoma, extranodal and solid organ sites: Secondary | ICD-10-CM

## 2012-04-13 DIAGNOSIS — R161 Splenomegaly, not elsewhere classified: Secondary | ICD-10-CM

## 2012-04-13 DIAGNOSIS — D539 Nutritional anemia, unspecified: Secondary | ICD-10-CM

## 2012-04-13 DIAGNOSIS — D649 Anemia, unspecified: Secondary | ICD-10-CM

## 2012-04-13 LAB — CBC WITH DIFFERENTIAL/PLATELET
BASO%: 0.8 % (ref 0.0–2.0)
LYMPH%: 10.2 % — ABNORMAL LOW (ref 14.0–49.0)
MCHC: 32.4 g/dL (ref 32.0–36.0)
MONO#: 0.6 10*3/uL (ref 0.1–0.9)
RBC: 3.2 10*6/uL — ABNORMAL LOW (ref 4.20–5.82)
RDW: 19.8 % — ABNORMAL HIGH (ref 11.0–14.6)
WBC: 4.1 10*3/uL (ref 4.0–10.3)
lymph#: 0.4 10*3/uL — ABNORMAL LOW (ref 0.9–3.3)
nRBC: 0 % (ref 0–0)

## 2012-04-13 LAB — MORPHOLOGY: PLT EST: DECREASED

## 2012-04-13 NOTE — Telephone Encounter (Signed)
Calendar appt given to pt today for end of May 2013

## 2012-04-13 NOTE — Progress Notes (Signed)
Hematology and Oncology Follow Up Visit  GUHAN BRUINGTON [id number] 1927/01/24 78 y.o. 04/13/2012 2:55 PM   Principle Diagnosis:  mantle cell lymphoma Encounter Diagnoses  Name Primary?  . Lymphoma   . Thrombocytopenia Yes  . Splenomegaly   . History of blood transfusion   . Thrombocytopenia   . DIABETES MELLITUS, TYPE II   . Unspecified deficiency anemia      Interim History:  78 year old man recently evaluated by my partner Dr. Dalene Carrow for newly diagnosed mantle cell lymphoma. She asked if I would assume his care. He was initially found to be anemic in December of 2012. While evaluation was in progress he developed symptomatic coronary disease and was admitted to the hospital in February 2013 and told he had a slight heart attack. He underwent a coronary stent procedure at that time. He is status post a coronary bypass surgery in 1995 by Dr. Melrose Nakayama. He never fully recovered from that hospital admission. He began to develop progressive fatigue and an approximate 30 pound weight loss down to 181 pounds. He was found to be pancytopenic and have splenomegaly. CT scans of the chest abdomen pelvis and a PET scan were done and I personally reviewed these images. CT scan done on 03/21/2012 confirmed splenomegaly spleen measuring 18 cm, periportal, retroperitoneal, and iliac lymphadenopathy. Small bilateral external iliac lymph nodes up to 2 cm. No disease in the chest. PET scan done on April 23 in my opinion is remarkably unremarkable. If it wasn't for SUV measurements made by the radiologist one would not know that he had a problem. However readings on the perihepatic nodes are SUV up to 5.3 with values of up to 6.3 over the right external iliac lymph node. A bone marrow biopsy was recommended to complete his staging but he declined the procedure. Of note is progressive thrombocytopenia and I suspect that he does have bone marrow involvement in addition to the thrombocytopenia that one might see  with splenomegaly alone.      Medications: reviewed  For past medical history see Dr. Lonell Face consult note. He has known coronary disease, diabetes, hypertension, history of recurrent renal stones, history of herpes zoster infection right ophthalmic and trigeminal distribution, degenerative arthritis of the spine status post back surgery, bilateral surgery on his shoulders. Coronary bypass surgery 1995, coronary stent procedure in February 2013.  Allergies:  Allergies  Allergen Reactions  . Azithromycin Itching  . Macrodantin Itching  . Minocycline Hcl Itching  . Minocycline Hcl Itching  . Nitrofurantoin Itching  . Zithromax (Azithromycin Dihydrate) Itching  . Contrast Media (Iodinated Diagnostic Agents) Rash, itching, and hives     Review of Systems: Constitutional:   See above Respiratory: He has dyspnea at rest. He is wheezing today. Cardiovascular:  Currently no chest pain or palpitations Gastrointestinal: No abdominal pain no change in bowel habit Genito-Urinary: No urinary tract symptoms except for urinary frequency and nocturia Musculoskeletal: No significant muscle or bone pain. He has had problems with his shoulders requiring bilateral surgery in the past. Neurologic: He has developed postherpetic neuralgia over the trigeminal nerve distribution right side of his face now one year status post a herpes zoster infection. Skin: No rash or ecchymoses Remaining ROS negative.  Physical Exam: Blood pressure 149/60, pulse 73, temperature 98.1 F (36.7 C), temperature source Oral, height 6\' 1"  (1.854 m), weight 206 lb 6.4 oz (93.622 kg). Wt Readings from Last 3 Encounters:  04/13/12 206 lb 6.4 oz (93.622 kg)  04/07/12 201 lb 14.4 oz (91.581  kg)  03/22/12 198 lb 12.8 oz (90.175 kg)     General appearance: Chronically ill-appearing, pale, Caucasian man who is dyspneic at rest and is wheezing HENNT: Pharynx no erythema or exudate Lymph nodes: No cervical, supraclavicular,  axillary, or inguinal adenopathy Breasts: Lungs: Diffuse decreased breath sounds, scattered bilateral wheezes, resonant to percussion Heart: Regular rhythm 3/6 systolic murmur loudest at the apex Abdomen: Soft and nontender. Spleen is enlarged about 12 cm below left costal margin Extremities: No edema no calf tenderness Vascular: No cyanosis Neurologic: Mental status intact, cranial nerves grossly normal, pupils equal reactive to light, optic discs sharp vessels normal, motor strength 5 over 5, reflexes absent symmetric at the knees 1+ symmetric at the biceps, skin:vibration sensation is significantly decreased over the fingertips by tuning fork exam   Lab Results: Lab Results  Component Value Date   WBC 4.1 04/13/2012   HGB 9.8* 04/13/2012   HCT 30.3* 04/13/2012   MCV 94.7 04/13/2012   PLT 47* 04/13/2012     Chemistry      Component Value Date/Time   NA 138 03/16/2012 1128   K 4.7 03/16/2012 1128   CL 108 03/16/2012 1128   CO2 21 03/16/2012 1128   BUN 32* 03/16/2012 1128   CREATININE 1.24 03/16/2012 1128      Component Value Date/Time   CALCIUM 9.6 03/16/2012 1128   ALKPHOS 122* 03/16/2012 1128   AST 21 03/16/2012 1128   ALT 10 03/16/2012 1128   BILITOT 0.7 03/16/2012 1128       Radiological Studies: See discussion above    Impression and Plan: Clinical stage IV B. mantle cell lymphoma Diagnosis, prognosis, and available treatment options discussed at length with the patient and his wife and I gave them written notes and showed him pictures as we talked. He is at a disadvantage starting treatment due to active cardiopulmonary disease, anemia, and thrombocytopenia. I outlined a chemotherapy/immunotherapy program that I think that he will tolerate and respond to. Ordinarily I would give a combination of Treanda plus Rituxan. In view of his tenuous performance status I'm going to start out with single agent chemotherapy with the Treanda at a dose of 70 mg per meter squared day 1 and day 2  every 28 days and begin his first treatment this Monday, May 13. I will start him on allopurinol to avoid a tumor lysis syndrome. If he tolerates treatment well, I will add Rituxan going into cycle 2 at a standard dose of 375 mg per meter squared day 1 of each 28 day cycle. Overview of chemotherapy mechanism of action, and potential side effects, reviewed with the patient and his wife in detail. Subsequent to my visit, he received a more formal chemotherapy education from our nurses. I will put him on prophylactic acyclovir 400 mg twice daily. I told him to expect that he would get worse before he gets better. We are already starting out with anemia and thrombocytopenia and I anticipate that his counts will fall in he will need transfusion support. He is advised to call immediately for any bleeding, or fever 101 or above. He just had a new stent put in in February. Given his current platelet count of 47,000 and concomitant use of aspirin and need for chemotherapy, I will discuss with his cardiologist whether or not we can stop the Plavix for now.    CC:. Dr. Ivery Quale; Dr. Maisie Fus wall; Dr. Azzie Roup ; Dr. Josephine Igo   Levert Feinstein, MD 5/9/20132:55 PM

## 2012-04-14 ENCOUNTER — Other Ambulatory Visit: Payer: Self-pay | Admitting: *Deleted

## 2012-04-14 ENCOUNTER — Telehealth: Payer: Self-pay | Admitting: *Deleted

## 2012-04-14 ENCOUNTER — Ambulatory Visit: Payer: Medicare Other

## 2012-04-14 DIAGNOSIS — C8589 Other specified types of non-Hodgkin lymphoma, extranodal and solid organ sites: Secondary | ICD-10-CM

## 2012-04-14 LAB — COMPREHENSIVE METABOLIC PANEL
ALT: 11 U/L (ref 0–53)
CO2: 23 mEq/L (ref 19–32)
Calcium: 9 mg/dL (ref 8.4–10.5)
Chloride: 103 mEq/L (ref 96–112)
Creatinine, Ser: 1.09 mg/dL (ref 0.50–1.35)
Glucose, Bld: 139 mg/dL — ABNORMAL HIGH (ref 70–99)
Total Protein: 5.7 g/dL — ABNORMAL LOW (ref 6.0–8.3)

## 2012-04-14 LAB — LACTATE DEHYDROGENASE: LDH: 192 U/L (ref 94–250)

## 2012-04-14 LAB — HEPATITIS PANEL, ACUTE: Hep B C IgM: NEGATIVE

## 2012-04-14 MED ORDER — ONDANSETRON 8 MG PO TBDP: 8.0000 mg | ORAL_TABLET | Freq: Three times a day (TID) | ORAL | Status: AC | PRN

## 2012-04-14 MED ORDER — ACYCLOVIR 400 MG PO TABS: 400.0000 mg | ORAL_TABLET | Freq: Two times a day (BID) | ORAL | Status: AC

## 2012-04-14 MED ORDER — ALLOPURINOL 300 MG PO TABS: 300.0000 mg | ORAL_TABLET | Freq: Every day | ORAL | Status: DC

## 2012-04-14 NOTE — Telephone Encounter (Signed)
Pt and wife aware Dr. Cyndie Chime & Dr. Daleen Squibb have reviewed pt medications. Pt can discontinue plavix altogether. Mylo Red RN

## 2012-04-17 ENCOUNTER — Telehealth: Payer: Self-pay

## 2012-04-17 ENCOUNTER — Ambulatory Visit (HOSPITAL_BASED_OUTPATIENT_CLINIC_OR_DEPARTMENT_OTHER): Payer: Medicare Other

## 2012-04-17 VITALS — BP 161/89 | HR 56 | Temp 98.1°F

## 2012-04-17 DIAGNOSIS — Z5111 Encounter for antineoplastic chemotherapy: Secondary | ICD-10-CM

## 2012-04-17 DIAGNOSIS — C8589 Other specified types of non-Hodgkin lymphoma, extranodal and solid organ sites: Secondary | ICD-10-CM

## 2012-04-17 DIAGNOSIS — C8590 Non-Hodgkin lymphoma, unspecified, unspecified site: Secondary | ICD-10-CM

## 2012-04-17 DIAGNOSIS — D696 Thrombocytopenia, unspecified: Secondary | ICD-10-CM

## 2012-04-17 MED ORDER — DEXAMETHASONE SODIUM PHOSPHATE 10 MG/ML IJ SOLN
10.0000 mg | Freq: Once | INTRAMUSCULAR | Status: AC
  Administered 2012-04-17: 10 mg via INTRAVENOUS

## 2012-04-17 MED ORDER — DIPHENHYDRAMINE HCL 25 MG PO CAPS
50.0000 mg | ORAL_CAPSULE | Freq: Once | ORAL | Status: AC
Start: 2012-04-17 — End: 2012-04-17
  Administered 2012-04-17: 50 mg via ORAL

## 2012-04-17 MED ORDER — SODIUM CHLORIDE 0.9 % IV SOLN
Freq: Once | INTRAVENOUS | Status: AC
  Administered 2012-04-17: 15:00:00 via INTRAVENOUS

## 2012-04-17 MED ORDER — ONDANSETRON 8 MG/50ML IVPB (CHCC)
8.0000 mg | Freq: Once | INTRAVENOUS | Status: AC
  Administered 2012-04-17: 8 mg via INTRAVENOUS

## 2012-04-17 MED ORDER — ACETAMINOPHEN 325 MG PO TABS
650.0000 mg | ORAL_TABLET | Freq: Once | ORAL | Status: AC
  Administered 2012-04-17: 650 mg via ORAL

## 2012-04-17 MED ORDER — SODIUM CHLORIDE 0.9 % IV SOLN
70.0000 mg/m2 | Freq: Once | INTRAVENOUS | Status: AC
  Administered 2012-04-17: 155 mg via INTRAVENOUS
  Filled 2012-04-17: qty 31

## 2012-04-17 NOTE — Telephone Encounter (Signed)
Spoke with pt and spouse in infusion room & instructed them to hold Plavix.  Per Ms. Raechel Ache, Dr Vern Claude nurse had notified them of the change on Friday 5/10, so pt's last dose was Friday morning.  dph

## 2012-04-18 ENCOUNTER — Ambulatory Visit (HOSPITAL_BASED_OUTPATIENT_CLINIC_OR_DEPARTMENT_OTHER): Payer: Medicare Other

## 2012-04-18 VITALS — BP 173/79 | HR 57 | Temp 97.7°F

## 2012-04-18 DIAGNOSIS — C8319 Mantle cell lymphoma, extranodal and solid organ sites: Secondary | ICD-10-CM

## 2012-04-18 DIAGNOSIS — Z5111 Encounter for antineoplastic chemotherapy: Secondary | ICD-10-CM

## 2012-04-18 DIAGNOSIS — D696 Thrombocytopenia, unspecified: Secondary | ICD-10-CM

## 2012-04-18 DIAGNOSIS — C8590 Non-Hodgkin lymphoma, unspecified, unspecified site: Secondary | ICD-10-CM

## 2012-04-18 MED ORDER — DEXAMETHASONE SODIUM PHOSPHATE 10 MG/ML IJ SOLN
10.0000 mg | Freq: Once | INTRAMUSCULAR | Status: AC
  Administered 2012-04-18: 10 mg via INTRAVENOUS

## 2012-04-18 MED ORDER — SODIUM CHLORIDE 0.9 % IV SOLN
Freq: Once | INTRAVENOUS | Status: AC
  Administered 2012-04-18: 16:00:00 via INTRAVENOUS

## 2012-04-18 MED ORDER — ONDANSETRON 8 MG/50ML IVPB (CHCC)
8.0000 mg | Freq: Once | INTRAVENOUS | Status: AC
  Administered 2012-04-18: 8 mg via INTRAVENOUS

## 2012-04-18 MED ORDER — SODIUM CHLORIDE 0.9 % IV SOLN
70.0000 mg/m2 | Freq: Once | INTRAVENOUS | Status: AC
  Administered 2012-04-18: 155 mg via INTRAVENOUS
  Filled 2012-04-18: qty 31

## 2012-04-18 MED ORDER — ALBUTEROL SULFATE (2.5 MG/3ML) 0.083% IN NEBU
2.5000 mg | INHALATION_SOLUTION | Freq: Once | RESPIRATORY_TRACT | Status: AC
  Administered 2012-04-18: 2.5 mg via RESPIRATORY_TRACT
  Filled 2012-04-18: qty 3

## 2012-04-18 NOTE — Patient Instructions (Signed)
 [facility] Discharge Instructions for Patients Receiving Chemotherapy  Today you received the following chemotherapy agent Treanda  To help prevent nausea and vomiting after your treatment, we encourage you to take your nausea medication. Begin taking it as often as prescribed by your physician.    If you develop nausea and vomiting that is not controlled by your nausea medication, call the clinic [phone]. If it is after clinic hours your family physician or the after hours number for the clinic or go to the Emergency Department.   BELOW ARE SYMPTOMS THAT SHOULD BE REPORTED IMMEDIATELY:  *FEVER GREATER THAN 100.5 F  *CHILLS WITH OR WITHOUT FEVER  NAUSEA AND VOMITING THAT IS NOT CONTROLLED WITH YOUR NAUSEA MEDICATION  *UNUSUAL SHORTNESS OF BREATH  *UNUSUAL BRUISING OR BLEEDING  TENDERNESS IN MOUTH AND THROAT WITH OR WITHOUT PRESENCE OF ULCERS  *URINARY PROBLEMS  *BOWEL PROBLEMS  UNUSUAL RASH Items with * indicate a potential emergency and should be followed up as soon as possible.  One of the nurses will contact you 24 hours after your treatment. Please let the nurse know about any problems that you may have experienced. Feel free to call the clinic you have any questions or concerns. The clinic phone number is [phone].   I have been informed and understand all the instructions given to me. I know to contact the clinic, my physician, or go to the Emergency Department if any problems should occur. I do not have any questions at this time, but understand that I may call the clinic during office hours   should I have any questions or need assistance in obtaining follow up care.    __________________________________________  _____________  __________ Signature of Patient or Authorized Representative            Date                   Time    __________________________________________ Nurse's Signature

## 2012-04-18 NOTE — Progress Notes (Signed)
1523- Pt presents coughing and wheezing in NAD. Wheezes heard all lung fields. Albuterol treatment given and tolerated well. Wheezing subsided.

## 2012-04-19 ENCOUNTER — Telehealth: Payer: Self-pay

## 2012-04-19 NOTE — Telephone Encounter (Signed)
Message copied by Abby Potash on Wed Apr 19, 2012  1:03 PM ------      Message from: Kallie Locks      Created: Mon Apr 17, 2012  3:16 PM      Regarding: "1st time chemo"       Patient received Treanda today, per Dr. Cyndie Chime.            Thanks,      Dorene Grebe

## 2012-04-19 NOTE — Telephone Encounter (Signed)
Called and spoke with pt's wife re: chemo f/u call.  She states pt is resting right now.  She states pt is eating and drinking well and has not had any diarrhea.  She states pt said he thought he might be feeling a little nauseated last night, and took anti-emetic last night and this morning, and has had no trouble.  She does have drug handouts, and knows to call office if any problems.

## 2012-05-02 ENCOUNTER — Telehealth: Payer: Self-pay | Admitting: *Deleted

## 2012-05-02 ENCOUNTER — Ambulatory Visit (HOSPITAL_BASED_OUTPATIENT_CLINIC_OR_DEPARTMENT_OTHER): Payer: Medicare Other | Admitting: Oncology

## 2012-05-02 ENCOUNTER — Ambulatory Visit (HOSPITAL_COMMUNITY)
Admission: RE | Admit: 2012-05-02 | Discharge: 2012-05-02 | Disposition: A | Discharge status: Home / Self Care | Payer: Medicare Other | Source: Ambulatory Visit | Attending: Oncology | Admitting: Oncology

## 2012-05-02 ENCOUNTER — Other Ambulatory Visit (HOSPITAL_BASED_OUTPATIENT_CLINIC_OR_DEPARTMENT_OTHER): Payer: Medicare Other | Admitting: Lab

## 2012-05-02 VITALS — BP 114/55 | HR 70 | Temp 97.0°F | Wt 202.7 lb

## 2012-05-02 DIAGNOSIS — D539 Nutritional anemia, unspecified: Secondary | ICD-10-CM

## 2012-05-02 DIAGNOSIS — C8313 Mantle cell lymphoma, intra-abdominal lymph nodes: Secondary | ICD-10-CM

## 2012-05-02 DIAGNOSIS — C8589 Other specified types of non-Hodgkin lymphoma, extranodal and solid organ sites: Secondary | ICD-10-CM

## 2012-05-02 DIAGNOSIS — E119 Type 2 diabetes mellitus without complications: Secondary | ICD-10-CM

## 2012-05-02 DIAGNOSIS — R609 Edema, unspecified: Secondary | ICD-10-CM

## 2012-05-02 DIAGNOSIS — Z9289 Personal history of other medical treatment: Secondary | ICD-10-CM

## 2012-05-02 DIAGNOSIS — R161 Splenomegaly, not elsewhere classified: Secondary | ICD-10-CM

## 2012-05-02 DIAGNOSIS — C8590 Non-Hodgkin lymphoma, unspecified, unspecified site: Secondary | ICD-10-CM

## 2012-05-02 DIAGNOSIS — M7989 Other specified soft tissue disorders: Secondary | ICD-10-CM | POA: Insufficient documentation

## 2012-05-02 DIAGNOSIS — D696 Thrombocytopenia, unspecified: Secondary | ICD-10-CM

## 2012-05-02 DIAGNOSIS — C8319 Mantle cell lymphoma, extranodal and solid organ sites: Secondary | ICD-10-CM

## 2012-05-02 DIAGNOSIS — M79609 Pain in unspecified limb: Secondary | ICD-10-CM

## 2012-05-02 LAB — COMPREHENSIVE METABOLIC PANEL
BUN: 45 mg/dL — ABNORMAL HIGH (ref 6–23)
CO2: 25 mEq/L (ref 19–32)
Calcium: 8.9 mg/dL (ref 8.4–10.5)
Chloride: 103 mEq/L (ref 96–112)
Creatinine, Ser: 1.15 mg/dL (ref 0.50–1.35)

## 2012-05-02 LAB — CBC WITH DIFFERENTIAL/PLATELET
Eosinophils Absolute: 0.2 10*3/uL (ref 0.0–0.5)
HCT: 27.6 % — ABNORMAL LOW (ref 38.4–49.9)
LYMPH%: 13.5 % — ABNORMAL LOW (ref 14.0–49.0)
MONO#: 0.5 10*3/uL (ref 0.1–0.9)
NEUT#: 2.1 10*3/uL (ref 1.5–6.5)
NEUT%: 64 % (ref 39.0–75.0)
Platelets: 61 10*3/uL — ABNORMAL LOW (ref 140–400)
WBC: 3.3 10*3/uL — ABNORMAL LOW (ref 4.0–10.3)

## 2012-05-02 LAB — LACTATE DEHYDROGENASE: LDH: 123 U/L (ref 94–250)

## 2012-05-02 NOTE — Telephone Encounter (Signed)
Per staff message from Eunice, I have scheduled treatment appts.  JMW  

## 2012-05-02 NOTE — Progress Notes (Signed)
*  PRELIMINARY RESULTS* Vascular Ultrasound Lower extremity venous duplex has been completed.  Preliminary findings: Right= No acute DVT. 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  Left= No acute DVT. Chronic DVT in common femoral vein. Chronic SVT in lesser saphenous vein.  Farrel Demark RDMS 05/02/2012, 1:14 PM

## 2012-05-02 NOTE — Progress Notes (Signed)
Hematology and Oncology Follow Up Visit  [name] [id number] 06/25/1927 78 y.o. 05/02/2012 2:11 PM   Principle Diagnosis: Encounter Diagnoses  Name Primary?  . Mantle cell lymphoma, intra-abdominal lymph nodes   . Peripheral edema Yes     Interim History:   Short interim followup visit for this 78 year old man with underlying coronary artery disease  recently started on chemotherapy for newly diagnosed mantle cell lymphoma. Most of his lymphadenopathy is intra-abdominal and perihepatic. He does not have bulky pelvic disease. I mention this because I don't think his  progressive lower extremity edema is related to the lymphoma. This is his main complaint today. Initial presenting problems with the lymphoma were progressive weakness and weight loss. Some of this initially was felt to be cardiac in nature. He required  coronary stent placement in February. Echocardiogram done in March showed an ejection fraction of 55%. there was grade 2 diastolic dysfunction .At time of his first visit with me on May 9, he had diffuse wheezing over his lungs. He stopped smoking 35 years ago. CT scan of the chest done as part of his staging evaluation in April showed cardiomegaly and small bilateral pleural effusions "suspicious for CHF" but no emphysematous changes were noted. He was on Lasix 20 mg daily. At time of a prior visit with my partner on April 17 his Lasix was increased to 40 mg daily. Weight recorded on April 17 was 198. Up to 206 on May 9. 2 of 3 today May 28. On May 9 serum total protein depressed at 5.7 g albumin low normal at 3.9 .repeat lab today with BUN 45 up from 21 creatinine 1.15 up from 1.1 total protein 4.9 with albumin 3.4. He denies any PND or orthopnea. He has been anorectic. Wife is giving him ensure supplements. Overall he tolerated his first chemotherapy treatment well. He got some nausea but no vomiting. Blood counts today at his nadir are essentially unchanged compared with his  pretreatment CBC.     Medications: reviewed  Allergies:  Allergies  Allergen Reactions  . Azithromycin Itching  . Macrodantin Itching  . Minocycline Hcl Itching  . Minocycline Hcl Itching  . Nitrofurantoin Itching  . Zithromax (Azithromycin Dihydrate) Itching  . Contrast Media (Iodinated Diagnostic Agents) Rash    Review of Systems: Constitutional:   progressive fatigue, weakness, and anorexia.   Respiratory: dyspnea on exertion but not at rest  Cardiovascular:   no chest pain or palpitations  Gastrointestinal: no abdominal pain or change in bowel habit  Genito-Urinary:  no urinary tract symptoms although he does admit to nocturia.  Musculoskeletal: no muscle or bone pain  Neurologic: no headache or change in vision. Chronic postherpetic neuralgia affecting the right side of his face  Skin: No rash or ecchymosis Remaining ROS negative.  Physical Exam: Blood pressure 114/55, pulse 70, temperature 97 F (36.1 C), temperature source Oral, weight 202 lb 11.2 oz (91.944 kg). Wt Readings from Last 3 Encounters:  05/02/12 202 lb 11.2 oz (91.944 kg)  04/13/12 206 lb 6.4 oz (93.622 kg)  04/07/12 201 lb 14.4 oz (91.581 kg)     General appearance:  he appears acutely and chronically ill  HENNT:  pharynx no erythema or exudate  Lymph nodes:  no palpable adenopathy neck axilla or groin  Breasts: Lungs:Clear to auscultation and resonant to percussion  Heart:Regular rhythm. 2/6 systolic murmur. No gallop. No rub. Positive jugular venous distention  Abdomen: no mass tenderness or organomegaly  Extremities: 2-3+ edema to the knees  Vascular: no cyanosis  Neurologic: no focal deficit  Skin: No rash or ecchymosis  Lab Results: Lab Results  Component Value Date   WBC 3.3* 05/02/2012   HGB 9.0* 05/02/2012   HCT 27.6* 05/02/2012   MCV 94.9 05/02/2012   PLT 61* 05/02/2012     Chemistry      Component Value Date/Time   NA 135 05/02/2012 0948   K 4.7 05/02/2012 0948   CL 103 05/02/2012  0948   CO2 25 05/02/2012 0948   BUN 45* 05/02/2012 0948   CREATININE 1.15 05/02/2012 0948      Component Value Date/Time   CALCIUM 8.9 05/02/2012 0948   ALKPHOS 94 05/02/2012 0948   AST 11 05/02/2012 0948   ALT 8 05/02/2012 0948   BILITOT 0.8 05/02/2012 0948       Radiological Studies: Bilateral lower extremity Doppler studies done today show no acute DVT. Chest radiograph today:  I reviewed the images. The report is not yet available. To my eyes, there is cardiomegaly and persistent bilateral small pleural effusions. No gross congestive failure. No other infiltrates.     Impression and Plan: #1. Mantle cell lymphoma. Overall medical condition is very frail. He tolerated his first cycle of chemotherapy using single agent Arzerra. Nadir counts today are good. I'm still reluctant to add Rituxan to his regimen until he is more stable medically.  #2. Progressive peripheral edema. He may be in some heart failure. Diuretic dose has been increased and I am reluctant to increase any further since his BUN is coming up to current value of 45. He has lost 3 pounds since the diuretic dose was decreased despite his subjective opinion that his legs are more swollen. I was able to discuss his situation with his cardiologist, Dr. wall, and he agrees to keep the diuretic at 40 mg daily for now. He is due to see both his internist and his cardiologist within the next 3 weeks. There seems to be significant a element of protein calorie malnutrition. Serum total protein is low and albumin is falling. I encouraged him to continue using nutritional supplements until his appetite improves.  #3. Coronary artery disease. Previous bypass surgery. Previous coronary stent.  #4. 8 mm left lower lobe noncalcified pulmonary nodule noted on CT staging evaluation for his lymphoma.  #5. Type 2 diabetes.  #6. 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    CC:. Dr. Ivery Quale; Dr. Valera Castle; Dr. Vernia Buff of mine   Levert Feinstein, MD 5/28/20132:11 PM

## 2012-05-02 NOTE — Progress Notes (Signed)
Received call from [facility] in vascular lab that pt's doppler was NEGATIVE for DVT bilaterally.  [name] states pt does have "chronic clots in both legs."    Report to Dr Cyndie Chime. dph

## 2012-05-03 ENCOUNTER — Telehealth: Payer: Self-pay | Admitting: Cardiology

## 2012-05-03 ENCOUNTER — Telehealth: Payer: Self-pay | Admitting: Oncology

## 2012-05-03 NOTE — Telephone Encounter (Signed)
New msg Pt's wife called about email that Dr Cyndie Chime was to send Dr Daleen Squibb. Please call back

## 2012-05-03 NOTE — Telephone Encounter (Signed)
Wife is aware Dr. Cyndie Chime & Dr. Daleen Squibb have spoken and will continue diuretic as prescribed. Continue daily weights. Pt has appt on 05/24/12 with Dr. Daleen Squibb. Mylo Red RN

## 2012-05-03 NOTE — Telephone Encounter (Signed)
called pts home s/w wife and informed her of appt 06/07 and to pick up scheduled for june and july on that day

## 2012-05-04 ENCOUNTER — Telehealth: Payer: Self-pay

## 2012-05-04 NOTE — Telephone Encounter (Signed)
Pt's spouse notified of doppler results.  Ms Scripter also aware that Dr Cyndie Chime has spoken with Dr Daleen Squibb, cardiology, and pt is to stay on current dose of Lasix - 40mg  daily.    Ms gotham raden understanding. dph

## 2012-05-11 ENCOUNTER — Other Ambulatory Visit: Payer: Self-pay | Admitting: Oncology

## 2012-05-12 ENCOUNTER — Encounter: Payer: Self-pay | Admitting: Oncology

## 2012-05-12 ENCOUNTER — Telehealth: Payer: Self-pay | Admitting: Oncology

## 2012-05-12 ENCOUNTER — Ambulatory Visit (HOSPITAL_BASED_OUTPATIENT_CLINIC_OR_DEPARTMENT_OTHER): Payer: Medicare Other | Admitting: Oncology

## 2012-05-12 VITALS — BP 120/53 | HR 59 | Temp 97.3°F | Ht 73.0 in | Wt 184.5 lb

## 2012-05-12 DIAGNOSIS — D696 Thrombocytopenia, unspecified: Secondary | ICD-10-CM

## 2012-05-12 DIAGNOSIS — R600 Localized edema: Secondary | ICD-10-CM

## 2012-05-12 DIAGNOSIS — R609 Edema, unspecified: Secondary | ICD-10-CM

## 2012-05-12 DIAGNOSIS — C8313 Mantle cell lymphoma, intra-abdominal lymph nodes: Secondary | ICD-10-CM

## 2012-05-12 DIAGNOSIS — J984 Other disorders of lung: Secondary | ICD-10-CM

## 2012-05-12 DIAGNOSIS — I2510 Atherosclerotic heart disease of native coronary artery without angina pectoris: Secondary | ICD-10-CM

## 2012-05-12 NOTE — Progress Notes (Signed)
Hematology and Oncology Follow Up Visit  [name] [id number] [birth year] 78 y.o. 05/12/2012 11:01 AM   Principle Diagnosis: Encounter Diagnoses  Name Primary?  . Mantle cell lymphoma of intra-abdominal lymph nodes Yes  . Peripheral edema      Interim History:  Short interim followup visit for this frail 78 year old man on active treatment for recently diagnosed mantle cell lymphoma. He presented with nonspecific fatigue and weight loss. He was found to have intra-abdominal adenopathy, primarily perihepatic, and splenomegaly. Biopsies diagnostic for mantle cell. He declined a bone marrow biopsy. He has now had one cycle of bendamustine at 70 mg per meter squared day 1 and day 2. He tolerated the treatment well with some nausea but no vomiting. I was reluctant to add Rituxan to the program due to his overall unstable cardiopulmonary status. He had significant wheezing at time of his first visit with me. He had marked peripheral edema 2-3+. He is not a current smoker but it appears that he primarily has right-sided heart failure. Diuretic management was discussed with his cardiologist. He has slowly diuresed done increase dose Lasix 40 mg daily. Weight today is down to 184 pounds compared with 206 pounds recorded on May 9. I also discussed his antiplatelet agents with his cardiologist. It turns out that the new cardiac stent that was just placed back in November was a bare metal stent. I therefore, it is acceptable to stop his Plavix. He has thrombocytopenia due to the splenomegaly from the lymphoma. Appetite is still poor but his wife is forcing him to eat.   Medications: reviewed  Allergies:  Allergies  Allergen Reactions  . Azithromycin Itching  . Macrodantin Itching  . Minocycline Hcl Itching  . Minocycline Hcl Itching  . Nitrofurantoin Itching  . Zithromax (Azithromycin Dihydrate) Itching  . Contrast Media (Iodinated Diagnostic Agents) Rash    Review of  Systems: Constitutional:   Persistent weakness fatigue and anorexia Respiratory: Currently no dyspnea or wheezing Cardiovascular:  He denies chest pain or palpitations Gastrointestinal: He is moving his bowels with no problem Genito-Urinary: Frequent urination no dysuria Musculoskeletal: No muscle or bone pain Neurologic: No headache or change in vision Skin: Skin tumor left cheek just treated with local cryotherapy by his dermatologist Remaining ROS negative.  Physical Exam: Blood pressure 120/53, pulse 59, temperature 97.3 F (36.3 C), temperature source Oral, height 6\' 1"  (1.854 m), weight 184 lb 8 oz (83.689 kg). Wt Readings from Last 3 Encounters:  05/12/12 184 lb 8 oz (83.689 kg)  05/02/12 202 lb 11.2 oz (91.944 kg)  04/13/12 206 lb 6.4 oz (93.622 kg)     General appearance: Frail elderly man HENNT: Pharynx no erythema exudate or ulcer, fresh lesion on his left cheek post cryotherapy of a cutaneous tumor Lymph nodes: No cervical supraclavicular or axillary adenopathy Breasts: Lungs: Now clear to auscultation and resonant to percussion Heart: Regular rhythm. 1/6 aortic systolic murmur and 2-3/6 mitral regurgitation murmur at the apex Abdomen: Soft nontender no mass spleen not palpable Extremities: Decreased edema from 3+ to 1+ Vascular: No cyanosis Neurologic: Motor strength is 5 over 5 reflexes 1+ symmetric Skin: Healing lesion left cheek  Lab Results: Lab Results  Component Value Date   WBC 3.3* 05/02/2012   HGB 9.0* 05/02/2012   HCT 27.6* 05/02/2012   MCV 94.9 05/02/2012   PLT 61* 05/02/2012     Chemistry      Component Value Date/Time   NA 135 05/02/2012 0948   K 4.7 05/02/2012 5621  CL 103 05/02/2012 0948   CO2 25 05/02/2012 0948   BUN 45* 05/02/2012 0948   CREATININE 1.15 05/02/2012 0948      Component Value Date/Time   CALCIUM 8.9 05/02/2012 0948   ALKPHOS 94 05/02/2012 0948   AST 11 05/02/2012 0948   ALT 8 05/02/2012 0948   BILITOT 0.8 05/02/2012 0948       Impression and Plan: #1. Mantle cell lymphoma. Clinical stage IB. Plan is continue single agent Treanda chemotherapy. I will restage him after his third cycle in July and if he is otherwise stable, consider the addition of Rituxan at that time.  #2. Coronary artery disease with previous MI, previous bypass surgery, recent bare metal coronary stent.  #3. Likely obstructive airway disease with right heart failure due to prior smoking history.  #4. Noncalcified 8 mm left lower lobe pulmonary lesion. Observation alone at this time. Restaging CT scan in August.  #5. Thrombocytopenia secondary to splenomegaly from lymphoma  #6. Type 2 diabetes.   CC:. Dr. Ivery Quale; Dr. Valera Castle;   Levert Feinstein, MD 6/7/201311:01 AM

## 2012-05-12 NOTE — Telephone Encounter (Signed)
Gv pt appt for june and july2013 

## 2012-05-15 ENCOUNTER — Other Ambulatory Visit (HOSPITAL_BASED_OUTPATIENT_CLINIC_OR_DEPARTMENT_OTHER): Payer: Medicare Other | Admitting: Lab

## 2012-05-15 ENCOUNTER — Ambulatory Visit (HOSPITAL_BASED_OUTPATIENT_CLINIC_OR_DEPARTMENT_OTHER): Payer: Medicare Other

## 2012-05-15 VITALS — BP 117/62 | HR 66 | Temp 97.5°F

## 2012-05-15 DIAGNOSIS — C8590 Non-Hodgkin lymphoma, unspecified, unspecified site: Secondary | ICD-10-CM

## 2012-05-15 DIAGNOSIS — D696 Thrombocytopenia, unspecified: Secondary | ICD-10-CM

## 2012-05-15 DIAGNOSIS — Z5111 Encounter for antineoplastic chemotherapy: Secondary | ICD-10-CM

## 2012-05-15 DIAGNOSIS — R609 Edema, unspecified: Secondary | ICD-10-CM

## 2012-05-15 DIAGNOSIS — C8313 Mantle cell lymphoma, intra-abdominal lymph nodes: Secondary | ICD-10-CM

## 2012-05-15 DIAGNOSIS — C8319 Mantle cell lymphoma, extranodal and solid organ sites: Secondary | ICD-10-CM

## 2012-05-15 LAB — CBC WITH DIFFERENTIAL/PLATELET
Basophils Absolute: 0 10*3/uL (ref 0.0–0.1)
Eosinophils Absolute: 0.2 10*3/uL (ref 0.0–0.5)
HGB: 10.1 g/dL — ABNORMAL LOW (ref 13.0–17.1)
MCV: 95.4 fL (ref 79.3–98.0)
MONO#: 1 10*3/uL — ABNORMAL HIGH (ref 0.1–0.9)
MONO%: 23 % — ABNORMAL HIGH (ref 0.0–14.0)
NEUT#: 3 10*3/uL (ref 1.5–6.5)
RDW: 19.1 % — ABNORMAL HIGH (ref 11.0–14.6)

## 2012-05-15 MED ORDER — SODIUM CHLORIDE 0.9 % IV SOLN
Freq: Once | INTRAVENOUS | Status: AC
  Administered 2012-05-15: 08:00:00 via INTRAVENOUS

## 2012-05-15 MED ORDER — DIPHENHYDRAMINE HCL 25 MG PO CAPS
50.0000 mg | ORAL_CAPSULE | Freq: Once | ORAL | Status: AC
  Administered 2012-05-15: 50 mg via ORAL

## 2012-05-15 MED ORDER — DEXAMETHASONE SODIUM PHOSPHATE 10 MG/ML IJ SOLN
10.0000 mg | Freq: Once | INTRAMUSCULAR | Status: AC
  Administered 2012-05-15: 10 mg via INTRAVENOUS

## 2012-05-15 MED ORDER — ACETAMINOPHEN 325 MG PO TABS
650.0000 mg | ORAL_TABLET | Freq: Once | ORAL | Status: AC
  Administered 2012-05-15: 650 mg via ORAL

## 2012-05-15 MED ORDER — SODIUM CHLORIDE 0.9 % IV SOLN
70.0000 mg/m2 | Freq: Once | INTRAVENOUS | Status: AC
  Administered 2012-05-15: 155 mg via INTRAVENOUS
  Filled 2012-05-15: qty 31

## 2012-05-15 MED ORDER — ONDANSETRON 8 MG/50ML IVPB (CHCC)
8.0000 mg | Freq: Once | INTRAVENOUS | Status: AC
  Administered 2012-05-15: 8 mg via INTRAVENOUS

## 2012-05-15 NOTE — Patient Instructions (Signed)
[facility] Discharge Instructions for Patients Receiving Chemotherapy  Today you received the following chemotherapy agents treanda  To help prevent nausea and vomiting after your treatment, we encourage you to take your nausea medication. Begin taking it at 2pm and take it as often as prescribed for the next 48 hours as needed.   If you develop nausea and vomiting that is not controlled by your nausea medication, call the clinic. If it is after clinic hours your family physician or the after hours number for the clinic or go to the Emergency Department.   BELOW ARE SYMPTOMS THAT SHOULD BE REPORTED IMMEDIATELY:  *FEVER GREATER THAN 100.5 F  *CHILLS WITH OR WITHOUT FEVER  NAUSEA AND VOMITING THAT IS NOT CONTROLLED WITH YOUR NAUSEA MEDICATION  *UNUSUAL SHORTNESS OF BREATH  *UNUSUAL BRUISING OR BLEEDING  TENDERNESS IN MOUTH AND THROAT WITH OR WITHOUT PRESENCE OF ULCERS  *URINARY PROBLEMS  *BOWEL PROBLEMS  UNUSUAL RASH Items with * indicate a potential emergency and should be followed up as soon as possible.  One of the nurses will contact you 24 hours after your treatment. Please let the nurse know about any problems that you may have experienced. Feel free to call the clinic you have any questions or concerns. The clinic phone number is [phone].   I have been informed and understand all the instructions given to me. I know to contact the clinic, my physician, or go to the Emergency Department if any problems should occur. I do not have any questions at this time, but understand that I may call the clinic during office hours   should I have any questions or need assistance in obtaining follow up care.    __________________________________________  _____________  __________ Signature of Patient or Authorized Representative            Date                   Time    __________________________________________ Nurse's Signature

## 2012-05-16 ENCOUNTER — Ambulatory Visit (HOSPITAL_BASED_OUTPATIENT_CLINIC_OR_DEPARTMENT_OTHER): Payer: Medicare Other

## 2012-05-16 VITALS — BP 108/60 | HR 58 | Temp 97.1°F

## 2012-05-16 DIAGNOSIS — Z5111 Encounter for antineoplastic chemotherapy: Secondary | ICD-10-CM

## 2012-05-16 DIAGNOSIS — C8590 Non-Hodgkin lymphoma, unspecified, unspecified site: Secondary | ICD-10-CM

## 2012-05-16 DIAGNOSIS — D696 Thrombocytopenia, unspecified: Secondary | ICD-10-CM

## 2012-05-16 DIAGNOSIS — C8589 Other specified types of non-Hodgkin lymphoma, extranodal and solid organ sites: Secondary | ICD-10-CM

## 2012-05-16 MED ORDER — ONDANSETRON 8 MG/50ML IVPB (CHCC)
8.0000 mg | Freq: Once | INTRAVENOUS | Status: AC
  Administered 2012-05-16: 8 mg via INTRAVENOUS

## 2012-05-16 MED ORDER — DEXAMETHASONE SODIUM PHOSPHATE 10 MG/ML IJ SOLN
10.0000 mg | Freq: Once | INTRAMUSCULAR | Status: AC
  Administered 2012-05-16: 10 mg via INTRAVENOUS

## 2012-05-16 MED ORDER — SODIUM CHLORIDE 0.9 % IV SOLN
Freq: Once | INTRAVENOUS | Status: AC
  Administered 2012-05-16: 15:00:00 via INTRAVENOUS

## 2012-05-16 MED ORDER — HEPARIN SOD (PORK) LOCK FLUSH 100 UNIT/ML IV SOLN
500.0000 [IU] | Freq: Once | INTRAVENOUS | Status: DC | PRN
  Filled 2012-05-16: qty 5

## 2012-05-16 MED ORDER — SODIUM CHLORIDE 0.9 % IJ SOLN
10.0000 mL | INTRAMUSCULAR | Status: DC | PRN
  Filled 2012-05-16: qty 10

## 2012-05-16 MED ORDER — SODIUM CHLORIDE 0.9 % IV SOLN
70.0000 mg/m2 | Freq: Once | INTRAVENOUS | Status: AC
  Administered 2012-05-16: 155 mg via INTRAVENOUS
  Filled 2012-05-16: qty 31

## 2012-05-16 NOTE — Patient Instructions (Signed)
[facility] Discharge Instructions for Patients Receiving Chemotherapy  Today you received the following chemotherapy agents Bendamustine  To help prevent nausea and vomiting after your treatment, we encourage you to take your nausea medication  As prescribed By Dr. Cyndie Chime.  If you develop nausea and vomiting that is not controlled by your nausea medication, call the clinic. If it is after clinic hours your family physician or the after hours number for the clinic or go to the Emergency Department.   BELOW ARE SYMPTOMS THAT SHOULD BE REPORTED IMMEDIATELY:  *FEVER GREATER THAN 100.5 F  *CHILLS WITH OR WITHOUT FEVER  NAUSEA AND VOMITING THAT IS NOT CONTROLLED WITH YOUR NAUSEA MEDICATION  *UNUSUAL SHORTNESS OF BREATH  *UNUSUAL BRUISING OR BLEEDING  TENDERNESS IN MOUTH AND THROAT WITH OR WITHOUT PRESENCE OF ULCERS  *URINARY PROBLEMS  *BOWEL PROBLEMS  UNUSUAL RASH Items with * indicate a potential emergency and should be followed up as soon as possible.  . Feel free to call the clinic you have any questions or concerns. The clinic phone number is [phone].   I have been informed and understand all the instructions given to me. I know to contact the clinic, my physician, or go to the Emergency Department if any problems should occur. I do not have any questions at this time, but understand that I may call the clinic during office hours   should I have any questions or need assistance in obtaining follow up care.    __________________________________________  _____________  __________ Signature of Patient or Authorized Representative            Date                   Time    __________________________________________ Nurse's Signature

## 2012-05-24 ENCOUNTER — Ambulatory Visit (INDEPENDENT_AMBULATORY_CARE_PROVIDER_SITE_OTHER): Payer: Medicare Other | Admitting: Cardiology

## 2012-05-24 ENCOUNTER — Encounter: Payer: Self-pay | Admitting: Cardiology

## 2012-05-24 VITALS — BP 94/50 | HR 74 | Ht 73.0 in | Wt 180.0 lb

## 2012-05-24 DIAGNOSIS — E785 Hyperlipidemia, unspecified: Secondary | ICD-10-CM

## 2012-05-24 DIAGNOSIS — I350 Nonrheumatic aortic (valve) stenosis: Secondary | ICD-10-CM

## 2012-05-24 DIAGNOSIS — I10 Essential (primary) hypertension: Secondary | ICD-10-CM

## 2012-05-24 DIAGNOSIS — E119 Type 2 diabetes mellitus without complications: Secondary | ICD-10-CM

## 2012-05-24 DIAGNOSIS — I2510 Atherosclerotic heart disease of native coronary artery without angina pectoris: Secondary | ICD-10-CM

## 2012-05-24 DIAGNOSIS — I679 Cerebrovascular disease, unspecified: Secondary | ICD-10-CM

## 2012-05-24 DIAGNOSIS — I359 Nonrheumatic aortic valve disorder, unspecified: Secondary | ICD-10-CM

## 2012-05-24 MED ORDER — ISOSORBIDE MONONITRATE ER 30 MG PO TB24: 30.0000 mg | ORAL_TABLET | Freq: Every day | ORAL | Status: DC

## 2012-05-24 NOTE — Patient Instructions (Addendum)
Your physician wants you to follow-up in: 6 months with Dr. Wall.  You will receive a reminder letter in the mail two months in advance. If you don't receive a letter, please call our office to schedule the follow-up appointment.  

## 2012-05-24 NOTE — Assessment & Plan Note (Signed)
Stable

## 2012-05-24 NOTE — Assessment & Plan Note (Signed)
Stable. Continue medical therapy 

## 2012-05-24 NOTE — Progress Notes (Signed)
HPI Mr. Mazurowski returns today for followup of his coronary artery disease, history of recent MI with a bare-metal stent, previous bypass surgery with 4 out of 4 patent grafts, and mild left ventricular dysfunction.  He has Mantle cell lymphoma. He is receiving active chemotherapy. We had to stop his Plavix because of thrombocytopenia.  He's lost a lot of weight. He denies any angina or chest pain. He has had orthopnea PND. His wife said he had pretty severe edema a few weeks ago but resolved. He continues to take Lasix.    Past Medical History  Diagnosis Date  . CAD (coronary artery disease)     a. s/p CABG 1995;  b. NSTEMI & subsequent BMS to SVG->right PDA 02/03/12.;  c. Cath 02/14/2012 3vd with 4/4 patent grafts and patent stent in vg->pda.  . Cerebrovascular disease, unspecified   . Unspecified essential hypertension   . Other and unspecified hyperlipidemia   . Type II or unspecified type diabetes mellitus without mention of complication, not stated as uncontrolled   . Osteoarthritis (arthritis due to wear and tear of joints)   . Asthmatic bronchitis   . BPH (benign prostatic hypertrophy)   . Herpes zoster   . Arthritis   . Anemia   . Thrombocytopenia   . Aortic stenosis, moderate     a. Echo 02/14/12 EF 55%, Gr 2 DD, Mod AS (mean grad: 29, peak grad 52)  . Splenomegaly 04/13/2012  . History of blood transfusion 04/13/2012  . Mantle cell lymphoma of intra-abdominal lymph nodes 05/12/2012    Current Outpatient Prescriptions  Medication Sig Dispense Refill  . acyclovir (ZOVIRAX) 400 MG tablet Take 400 mg by mouth 2 (two) times daily.      Marland Kitchen allopurinol (ZYLOPRIM) 300 MG tablet Take 1 tablet (300 mg total) by mouth daily.  60 tablet  0  . aspirin EC 81 MG tablet Take 81 mg by mouth every morning.       . furosemide (LASIX) 40 MG tablet Take 40 mg by mouth daily. Fluid       . gabapentin (NEURONTIN) 300 MG capsule Take 300 mg by mouth 3 (three) times daily. For shingles pain      .  HYDROcodone-acetaminophen (NORCO) 5-325 MG per tablet Take 1 tablet by mouth 3 (three) times daily as needed. Pt stated he tries to take as scheduled for shingles pain      . HYDROcodone-homatropine (HYCODAN) 5-1.5 MG/5ML syrup       . isosorbide mononitrate (IMDUR) 30 MG 24 hr tablet Take 1 tablet (30 mg total) by mouth daily.  30 tablet  11  . losartan (COZAAR) 100 MG tablet Take 1 tablet by mouth Daily.      . meloxicam (MOBIC) 15 MG tablet Take 15 mg by mouth daily.      . metFORMIN (GLUCOPHAGE) 500 MG tablet Take 1 tablet (500 mg total) by mouth 2 (two) times daily with a meal.      . metoprolol succinate (TOPROL-XL) 25 MG 24 hr tablet Take 25 mg by mouth every morning.       . nitroGLYCERIN (NITROSTAT) 0.4 MG SL tablet Place 0.4 mg under the tongue every 5 (five) minutes as needed. For chest pain.      Marland Kitchen ondansetron (ZOFRAN) 8 MG tablet Take by mouth every 8 (eight) hours as needed.      . ondansetron (ZOFRAN-ODT) 8 MG disintegrating tablet       . pravastatin (PRAVACHOL) 40 MG tablet Take 40 mg  by mouth daily.      . vitamin B-12 (CYANOCOBALAMIN) 1000 MCG tablet Take 1,000 mcg by mouth daily.      Marland Kitchen DISCONTD: isosorbide mononitrate (IMDUR) 30 MG 24 hr tablet Take 30 mg by mouth daily.        Allergies  Allergen Reactions  . Azithromycin Itching  . Macrodantin Itching  . Minocycline Hcl Itching  . Minocycline Hcl Itching  . Nitrofurantoin Itching  . Zithromax (Azithromycin Dihydrate) Itching  . Contrast Media (Iodinated Diagnostic Agents) Rash    Family History  Problem Relation Age of Onset  . Coronary artery disease    . Alcohol abuse      History   Social History  . Marital Status: Married    Spouse Name: N/A    Number of Children: N/A  . Years of Education: N/A   Occupational History  . retired [employer]   Social History Main Topics  . Smoking status: Former Smoker -- 1.0 packs/day for 30 years    Types: Cigarettes, Pipe    Quit date: 03/29/1985  . Smokeless  tobacco: Never Used   Comment: quit in 1986  . Alcohol Use: No  . Drug Use: No  . Sexually Active: Not Currently   Other Topics Concern  . Not on file   Social History Narrative  . No narrative on file    ROS ALL NEGATIVE EXCEPT THOSE NOTED IN HPI  PE  General Appearance: well developed, well nourished in no acute distress, frail, has lost a lot of weight HEENT: symmetrical face, PERRLA, good dentition  Neck: no JVD, thyromegaly, or adenopathy, trachea midline Chest: symmetric without deformity Cardiac: PMI non-displaced, RRR, normal S1, S2, no gallop or murmur Lung: clear to ausculation and percussion Vascular: all pulses full without bruits  Abdominal: nondistended, nontender, good bowel sounds, no HSM, no bruits Extremities: no cyanosis, clubbing, 1+ pretibial edema , no sign of DVT, no varicosities  Skin: normal color, no rashes Neuro: alert and oriented x 3, non-focal Pysch: normal affect  EKG  BMET    Component Value Date/Time   NA 135 05/02/2012 0948   K 4.7 05/02/2012 0948   CL 103 05/02/2012 0948   CO2 25 05/02/2012 0948   GLUCOSE 141* 05/02/2012 0948   BUN 45* 05/02/2012 0948   CREATININE 1.15 05/02/2012 0948   CALCIUM 8.9 05/02/2012 0948   GFRNONAA 67* 02/15/2012 0903   GFRAA 78* 02/15/2012 0903    Lipid Panel     Component Value Date/Time   CHOL 111 02/02/2012 0910   TRIG 76 02/02/2012 0910   HDL 24* 02/02/2012 0910   CHOLHDL 4.6 02/02/2012 0910   VLDL 15 02/02/2012 0910   LDLCALC 72 02/02/2012 0910    CBC    Component Value Date/Time   WBC 4.4 05/15/2012 0736   WBC 4.1 03/29/2012 0750   RBC 3.29* 05/15/2012 0736   RBC 3.14* 03/29/2012 0750   HGB 10.1* 05/15/2012 0736   HGB 9.5* 03/29/2012 0750   HCT 31.4* 05/15/2012 0736   HCT 29.9* 03/29/2012 0750   PLT 90* 05/15/2012 0736   PLT 67* 03/29/2012 0750   MCV 95.4 05/15/2012 0736   MCV 95.2 03/29/2012 0750   MCH 30.7 05/15/2012 0736   MCH 30.3 03/29/2012 0750   MCHC 32.2 05/15/2012 0736   MCHC 31.8 03/29/2012 0750    RDW 19.1* 05/15/2012 0736   RDW 18.9* 03/29/2012 0750   LYMPHSABS 0.3* 05/15/2012 0736   LYMPHSABS 0.8 02/13/2012 0042   MONOABS  1.0* 05/15/2012 0736   MONOABS 0.3 02/13/2012 0042   EOSABS 0.2 05/15/2012 0736   EOSABS 0.1 02/13/2012 0042   BASOSABS 0.0 05/15/2012 0736   BASOSABS 0.0 02/13/2012 0042

## 2012-05-31 ENCOUNTER — Telehealth: Payer: Self-pay

## 2012-05-31 NOTE — Telephone Encounter (Signed)
Received call from pt's wife stating over the past week, pt has developed a red, splotchy rash on his back, with pink bumps, that itches.  She states pt has had skin issues before similar to this and had been prescribed Zonalon cream by a dermatologist, and mometasone cream by PCP, which did not help in the past.  She states they bought hydrocortisone topical spray OTC previously, and she tried this, but it has not helped.  She states pt has not had a fever, changed medications, gotten into any plants, or changed detergents.  She states pt's legs itch as well, but there is no rash on them.  She wants to know are there any recommendations for what pt can do.  Informed her will speak with providers, and office will call her back.

## 2012-05-31 NOTE — Telephone Encounter (Signed)
Called and spoke with pt's wife to inform her per Misty Stanley, NP, this does not sound like it is an allergic reaction from [place], but pt could watch the area for a while to see if it improves, try benadryl po or cream, or come in to office to be seen if they feel she needs to take a look at it.  Pt's wife verbalizes understanding and states they will try po benadryl and wait til in the morning and call office if symptoms do not improve.

## 2012-06-06 ENCOUNTER — Inpatient Hospital Stay (HOSPITAL_COMMUNITY): Payer: Medicare Other

## 2012-06-06 ENCOUNTER — Telehealth: Payer: Self-pay | Admitting: *Deleted

## 2012-06-06 ENCOUNTER — Inpatient Hospital Stay (HOSPITAL_COMMUNITY)
Admission: AD | Admit: 2012-06-06 | Discharge: 2012-06-09 | DRG: 193 | Disposition: A | Discharge status: Home / Self Care | Payer: Medicare Other | Source: Ambulatory Visit | Attending: Oncology | Admitting: Oncology

## 2012-06-06 ENCOUNTER — Other Ambulatory Visit: Payer: Self-pay | Admitting: *Deleted

## 2012-06-06 ENCOUNTER — Encounter (HOSPITAL_COMMUNITY): Payer: Self-pay

## 2012-06-06 ENCOUNTER — Encounter: Payer: Self-pay | Admitting: Oncology

## 2012-06-06 ENCOUNTER — Ambulatory Visit (HOSPITAL_BASED_OUTPATIENT_CLINIC_OR_DEPARTMENT_OTHER): Payer: Medicare Other

## 2012-06-06 ENCOUNTER — Ambulatory Visit: Payer: Medicare Other | Admitting: Oncology

## 2012-06-06 VITALS — BP 128/62 | HR 79 | Temp 100.5°F | Ht 73.0 in | Wt 179.4 lb

## 2012-06-06 DIAGNOSIS — I2510 Atherosclerotic heart disease of native coronary artery without angina pectoris: Secondary | ICD-10-CM | POA: Diagnosis present

## 2012-06-06 DIAGNOSIS — R509 Fever, unspecified: Secondary | ICD-10-CM

## 2012-06-06 DIAGNOSIS — C8313 Mantle cell lymphoma, intra-abdominal lymph nodes: Secondary | ICD-10-CM

## 2012-06-06 DIAGNOSIS — I679 Cerebrovascular disease, unspecified: Secondary | ICD-10-CM | POA: Diagnosis present

## 2012-06-06 DIAGNOSIS — N400 Enlarged prostate without lower urinary tract symptoms: Secondary | ICD-10-CM | POA: Diagnosis present

## 2012-06-06 DIAGNOSIS — F29 Unspecified psychosis not due to a substance or known physiological condition: Secondary | ICD-10-CM | POA: Diagnosis present

## 2012-06-06 DIAGNOSIS — C8319 Mantle cell lymphoma, extranodal and solid organ sites: Secondary | ICD-10-CM | POA: Diagnosis present

## 2012-06-06 DIAGNOSIS — I359 Nonrheumatic aortic valve disorder, unspecified: Secondary | ICD-10-CM | POA: Diagnosis present

## 2012-06-06 DIAGNOSIS — Z66 Do not resuscitate: Secondary | ICD-10-CM | POA: Diagnosis present

## 2012-06-06 DIAGNOSIS — I10 Essential (primary) hypertension: Secondary | ICD-10-CM | POA: Diagnosis present

## 2012-06-06 DIAGNOSIS — Z951 Presence of aortocoronary bypass graft: Secondary | ICD-10-CM

## 2012-06-06 DIAGNOSIS — R5081 Fever presenting with conditions classified elsewhere: Secondary | ICD-10-CM

## 2012-06-06 DIAGNOSIS — E785 Hyperlipidemia, unspecified: Secondary | ICD-10-CM | POA: Diagnosis present

## 2012-06-06 DIAGNOSIS — T451X5A Adverse effect of antineoplastic and immunosuppressive drugs, initial encounter: Secondary | ICD-10-CM | POA: Diagnosis present

## 2012-06-06 DIAGNOSIS — R609 Edema, unspecified: Secondary | ICD-10-CM

## 2012-06-06 DIAGNOSIS — Z9221 Personal history of antineoplastic chemotherapy: Secondary | ICD-10-CM

## 2012-06-06 DIAGNOSIS — E119 Type 2 diabetes mellitus without complications: Secondary | ICD-10-CM

## 2012-06-06 DIAGNOSIS — R161 Splenomegaly, not elsewhere classified: Secondary | ICD-10-CM | POA: Diagnosis present

## 2012-06-06 DIAGNOSIS — L309 Dermatitis, unspecified: Secondary | ICD-10-CM

## 2012-06-06 DIAGNOSIS — J189 Pneumonia, unspecified organism: Principal | ICD-10-CM | POA: Diagnosis present

## 2012-06-06 DIAGNOSIS — D696 Thrombocytopenia, unspecified: Secondary | ICD-10-CM

## 2012-06-06 DIAGNOSIS — I252 Old myocardial infarction: Secondary | ICD-10-CM

## 2012-06-06 DIAGNOSIS — D61810 Antineoplastic chemotherapy induced pancytopenia: Secondary | ICD-10-CM | POA: Diagnosis present

## 2012-06-06 DIAGNOSIS — J45909 Unspecified asthma, uncomplicated: Secondary | ICD-10-CM | POA: Diagnosis present

## 2012-06-06 DIAGNOSIS — D709 Neutropenia, unspecified: Secondary | ICD-10-CM

## 2012-06-06 DIAGNOSIS — R05 Cough: Secondary | ICD-10-CM

## 2012-06-06 DIAGNOSIS — L259 Unspecified contact dermatitis, unspecified cause: Secondary | ICD-10-CM | POA: Diagnosis present

## 2012-06-06 DIAGNOSIS — Z79899 Other long term (current) drug therapy: Secondary | ICD-10-CM

## 2012-06-06 LAB — COMPREHENSIVE METABOLIC PANEL
BUN: 23 mg/dL (ref 6–23)
CO2: 26 mEq/L (ref 19–32)
Calcium: 9.7 mg/dL (ref 8.4–10.5)
Chloride: 97 mEq/L (ref 96–112)
Creatinine, Ser: 1.04 mg/dL (ref 0.50–1.35)
Glucose, Bld: 133 mg/dL — ABNORMAL HIGH (ref 70–99)
Total Bilirubin: 0.7 mg/dL (ref 0.3–1.2)

## 2012-06-06 LAB — URINALYSIS, ROUTINE W REFLEX MICROSCOPIC
Hgb urine dipstick: NEGATIVE
Protein, ur: NEGATIVE mg/dL
Urobilinogen, UA: 1 mg/dL (ref 0.0–1.0)

## 2012-06-06 LAB — CBC WITH DIFFERENTIAL/PLATELET
Basophils Absolute: 0 10*3/uL (ref 0.0–0.1)
Eosinophils Absolute: 0.1 10*3/uL (ref 0.0–0.5)
HCT: 33 % — ABNORMAL LOW (ref 38.4–49.9)
HGB: 10.9 g/dL — ABNORMAL LOW (ref 13.0–17.1)
LYMPH%: 9 % — ABNORMAL LOW (ref 14.0–49.0)
MCHC: 33.1 g/dL (ref 32.0–36.0)
MONO#: 1.1 10*3/uL — ABNORMAL HIGH (ref 0.1–0.9)
NEUT#: 3.2 10*3/uL (ref 1.5–6.5)
NEUT%: 65.8 % (ref 39.0–75.0)
Platelets: 95 10*3/uL — ABNORMAL LOW (ref 140–400)
WBC: 4.8 10*3/uL (ref 4.0–10.3)

## 2012-06-06 LAB — LACTATE DEHYDROGENASE: LDH: 209 U/L (ref 94–250)

## 2012-06-06 MED ORDER — LOSARTAN POTASSIUM 50 MG PO TABS
100.0000 mg | ORAL_TABLET | Freq: Every day | ORAL | Status: DC
  Administered 2012-06-07 – 2012-06-08 (×2): 100 mg via ORAL
  Filled 2012-06-06 (×4): qty 2

## 2012-06-06 MED ORDER — ONDANSETRON 8 MG PO TBDP
8.0000 mg | ORAL_TABLET | Freq: Three times a day (TID) | ORAL | Status: DC | PRN
  Filled 2012-06-06: qty 1

## 2012-06-06 MED ORDER — HYDROCODONE-ACETAMINOPHEN 5-325 MG PO TABS
1.0000 | ORAL_TABLET | Freq: Three times a day (TID) | ORAL | Status: DC | PRN
  Administered 2012-06-06 – 2012-06-07 (×3): 1 via ORAL
  Filled 2012-06-06 (×3): qty 1

## 2012-06-06 MED ORDER — ACETAMINOPHEN 325 MG PO TABS
650.0000 mg | ORAL_TABLET | Freq: Four times a day (QID) | ORAL | Status: DC | PRN
  Administered 2012-06-08: 650 mg via ORAL
  Filled 2012-06-06: qty 2

## 2012-06-06 MED ORDER — GABAPENTIN 300 MG PO CAPS
600.0000 mg | ORAL_CAPSULE | Freq: Two times a day (BID) | ORAL | Status: DC
  Administered 2012-06-07 – 2012-06-09 (×5): 600 mg via ORAL
  Filled 2012-06-06 (×7): qty 2

## 2012-06-06 MED ORDER — METOPROLOL SUCCINATE ER 25 MG PO TB24
25.0000 mg | ORAL_TABLET | Freq: Every day | ORAL | Status: DC
  Administered 2012-06-07 – 2012-06-08 (×2): 25 mg via ORAL
  Filled 2012-06-06 (×3): qty 1

## 2012-06-06 MED ORDER — SODIUM CHLORIDE 0.9 % IV SOLN
INTRAVENOUS | Status: DC
  Administered 2012-06-06 – 2012-06-08 (×2): via INTRAVENOUS

## 2012-06-06 MED ORDER — DEXTROSE 5 % IV SOLN
1.0000 g | Freq: Three times a day (TID) | INTRAVENOUS | Status: DC
  Administered 2012-06-06 – 2012-06-09 (×9): 1 g via INTRAVENOUS
  Filled 2012-06-06 (×11): qty 1

## 2012-06-06 MED ORDER — HYDROCODONE-HOMATROPINE 5-1.5 MG/5ML PO SYRP
5.0000 mL | ORAL_SOLUTION | Freq: Four times a day (QID) | ORAL | Status: DC | PRN
  Administered 2012-06-06 – 2012-06-08 (×4): 5 mL via ORAL
  Filled 2012-06-06 (×4): qty 5

## 2012-06-06 MED ORDER — GABAPENTIN 300 MG PO CAPS
300.0000 mg | ORAL_CAPSULE | Freq: Every day | ORAL | Status: DC
  Administered 2012-06-06 – 2012-06-08 (×3): 300 mg via ORAL
  Filled 2012-06-06 (×4): qty 1

## 2012-06-06 MED ORDER — GABAPENTIN 300 MG PO CAPS: 300.0000 mg | ORAL_CAPSULE | Freq: Three times a day (TID) | ORAL | Status: DC

## 2012-06-06 MED ORDER — ALUM & MAG HYDROXIDE-SIMETH 200-200-20 MG/5ML PO SUSP: 30.0000 mL | Freq: Four times a day (QID) | ORAL | Status: DC | PRN

## 2012-06-06 MED ORDER — SENNOSIDES-DOCUSATE SODIUM 8.6-50 MG PO TABS
1.0000 | ORAL_TABLET | Freq: Every evening | ORAL | Status: DC | PRN
  Filled 2012-06-06 (×2): qty 1

## 2012-06-06 MED ORDER — ENOXAPARIN SODIUM 30 MG/0.3ML ~~LOC~~ SOLN
30.0000 mg | SUBCUTANEOUS | Status: DC
  Administered 2012-06-06 – 2012-06-08 (×3): 30 mg via SUBCUTANEOUS
  Filled 2012-06-06 (×4): qty 0.3

## 2012-06-06 MED ORDER — MELOXICAM 15 MG PO TABS
15.0000 mg | ORAL_TABLET | Freq: Every day | ORAL | Status: DC
  Administered 2012-06-07 – 2012-06-08 (×2): 15 mg via ORAL
  Filled 2012-06-06 (×3): qty 1

## 2012-06-06 MED ORDER — ISOSORBIDE MONONITRATE ER 30 MG PO TB24
30.0000 mg | ORAL_TABLET | Freq: Every day | ORAL | Status: DC
Start: 2012-06-07 — End: 2012-06-09
  Administered 2012-06-07 – 2012-06-08 (×2): 30 mg via ORAL
  Filled 2012-06-06 (×3): qty 1

## 2012-06-06 MED ORDER — ACYCLOVIR 400 MG PO TABS
400.0000 mg | ORAL_TABLET | Freq: Two times a day (BID) | ORAL | Status: DC
  Administered 2012-06-06 – 2012-06-08 (×5): 400 mg via ORAL
  Filled 2012-06-06 (×7): qty 1

## 2012-06-06 MED ORDER — NITROGLYCERIN 0.4 MG SL SUBL: 0.4000 mg | SUBLINGUAL_TABLET | SUBLINGUAL | Status: DC | PRN

## 2012-06-06 MED ORDER — FUROSEMIDE 40 MG PO TABS
40.0000 mg | ORAL_TABLET | Freq: Every day | ORAL | Status: DC
  Administered 2012-06-07 – 2012-06-08 (×2): 40 mg via ORAL
  Filled 2012-06-06 (×3): qty 1

## 2012-06-06 MED ORDER — ASPIRIN EC 81 MG PO TBEC
81.0000 mg | DELAYED_RELEASE_TABLET | Freq: Every day | ORAL | Status: DC
  Administered 2012-06-07 – 2012-06-08 (×2): 81 mg via ORAL
  Filled 2012-06-06 (×3): qty 1

## 2012-06-06 NOTE — Telephone Encounter (Signed)
Received call from pt's wife stating that pt coughed all night & she took his temp & it was 97. 6 at first but when she took it again it was 102.  He feels warm & somewhat disoriented & coughing up white-yellow secretions.   Discussed with Dr Cyndie Chime & orders placed & pt to come in for possible admission.  Wife notified & she reports they will try to be here by 10:30am.

## 2012-06-06 NOTE — Progress Notes (Signed)
Work in visit for this frail 78 year old man under chemotherapy treatment for mantle cell lymphoma. He presents with progressive cough, fever, and intermittent confusion. Exam compatible with a left lower lobe pneumonia. He will be admitted to the hospital for further evaluation and treatment. They see history and physical for details.

## 2012-06-06 NOTE — Telephone Encounter (Signed)
Called [name]/[facility] Admitting & set up for admission;the patient to go to room 1308. Pt transported via w/c to floor @ 1

## 2012-06-06 NOTE — H&P (Signed)
Admission History and Physical      Chief Complaint: Fever and cough. HPI: Mr. Train is an 78 year old man recently diagnosed with mantle cell lymphoma. He initially presented with nonspecific fatigue and weight loss. He was found to have intra-abdominal adenopathy, primarily perihepatic, and splenomegaly. Biopsies were diagnostic for mantle cell lymphoma. He declined a bone marrow biopsy. He began treatment with bendamustine 70 mg per meter squared day 1 and day 2 on 04/17/2012. He completed cycle 2 bendamustine beginning 05/15/2012.  Mr. Gear presents today for evaluation of a fever and cough. He reports that he had a "rough night". He did not sleep well. He coughed "all night". The cough is productive with white/yellow phlegm expectorated. He initially noted a slight cough last week. There was marked worsening of the cough over the past 24 hours. He is taking Hycodan cough syrup. His wife checked his temperature earlier this morning and found it to be 97.6. At 9:30 this morning his temperature was 102. He admits to shaking chills. He denies shortness of breath. He vomited earlier this morning. No chest pains or palpitations. His wife noted that he was "disoriented" earlier.    Past Medical History  Diagnosis Date  . CAD (coronary artery disease)     a. s/p CABG 1995;  b. NSTEMI & subsequent BMS to SVG->right PDA 02/03/12.;  c. Cath 02/14/2012 3vd with 4/4 patent grafts and patent stent in vg->pda.  . Cerebrovascular disease, unspecified   . Unspecified essential hypertension   . Other and unspecified hyperlipidemia   . Type II or unspecified type diabetes mellitus without mention of complication, not stated as uncontrolled   . Osteoarthritis (arthritis due to wear and tear of joints)   . Asthmatic bronchitis   . BPH (benign prostatic hypertrophy)   . Herpes zoster   . Arthritis   . Anemia   . Thrombocytopenia   . Aortic stenosis, moderate     a. Echo 02/14/12 EF 55%, Gr 2 DD, Mod AS  (mean grad: 29, peak grad 52)  . Splenomegaly 04/13/2012  . History of blood transfusion 04/13/2012  . Mantle cell lymphoma of intra-abdominal lymph nodes 05/12/2012    Past Surgical History  Procedure Date  . Coronary artery bypass graft 1995  . Rotator cuff repair   . Carpal tunnel release   . Incisional hernia repair   . Kidney surgery   . Back surgery     Current medications: 1. Acyclovir 400 mg twice daily 2. Aspirin 81 mg daily 3. Lasix 40 mg daily 4. Neurontin 300 mg 3 times daily 5. Norco 5/325 one tablet by mouth 3 times daily as needed 6. Hycodan cough syrup 5 cc every 6 hours as needed 7. Imdur 30 mg daily 8. Cozaar 100 mg daily 9. Mobic 15 mg daily 10. Toprol-XL 25 mg daily 11. Nitroglycerin as needed 12. Zofran 8 mg every 8 hours as needed 13. Pravastatin 40 mg daily 14. B12 1000 mcg daily 15. Allopurinol 300 mg daily  Allergies  Allergen Reactions  . Azithromycin Itching  . Macrodantin Itching  . Minocycline Hcl Itching  . Nitrofurantoin Itching  . Zithromax (Azithromycin Dihydrate) Itching  . Contrast Media (Iodinated Diagnostic Agents) Rash    Family History  Problem Relation Age of Onset  . Coronary artery disease    . Alcohol abuse     Social history: He lives in [place]. He is married. Positive tobacco history.  ROS: One-week history of a cough. The cough has markedly worsened over the  past 24 hours. The cough is productive with white/yellow phlegm expectorated. Fever to 102 this morning. Intermittent shaking chills. Wife noted that he was "disoriented" earlier today. Recent decrease in appetite. No shortness of breath. No chest pain or palpitations. No hematuria or dysuria. He denies any unusual headaches. No vision change. He denies diarrhea. No mouth sores. No sore throat. Intermittent pruritic rash over back.  Physical: Temperature 100.5, heart rate 79, respirations 28-32, blood pressure 128/62.  General: Frail-appearing elderly man in no  acute distress. HEENT: Normocephalic atraumatic. Pupils equal round and reactive to light. Extraocular movements intact. Oropharynx without thrush or ulceration. Chest: Rales left lateral lung base. Cardiovascular: Regular cardiac rhythm. 2-3/6 systolic murmur. Abdomen: Soft and nontender. No organomegaly. Extremities: Trace pitting edema at the lower legs bilaterally. Neuro: Alert. Mildly confused. Follows commands. Motor strength 5 over 5. DTRs 1+, symmetric.  Labs:  Hemoglobin 10.9, white count 4.8, absolute count 3.2, platelet count 95,000.   Assessment/Plan  1. Fever/cough in an elderly patient undergoing chemotherapy for mantle cell lymphoma. 2. Mantle cell lymphoma status post cycle 2 bendamustine 05/15/2012. 3. Coronary artery disease, history of MI, previous bypass surgery, recent bare-metal coronary stent. 4. Likely obstructive airway disease with right heart failure due to prior smoking history. 5. Thrombocytopenia secondary to splenomegaly from lymphoma. 6. Type 2 diabetes.  Mr. Mudgett is an 78 year old man with mantle cell lymphoma status post cycle 2 bendamustine beginning 05/15/2012. He presents today with fever and cough. He is being admitted for initiation of IV antibiotics for presumed pneumonia. We will obtain cultures and a chest x-ray. Per discussion between Dr. Cyndie Chime and Mr. Schmader he will be placed on NO CODE BLUE status.  Patient interviewed and examined by Dr. Cyndie Chime; plan per Dr. Cyndie Chime.  Lonna Cobb ANP/GNP-BC 06/06/2012, 12:56 PM  I have personally interviewed and examined this patient and attest to accuracy of above evaluation and plan.   Dr Richardo Priest

## 2012-06-07 ENCOUNTER — Encounter (HOSPITAL_COMMUNITY): Payer: Self-pay | Admitting: Oncology

## 2012-06-07 DIAGNOSIS — D61818 Other pancytopenia: Secondary | ICD-10-CM

## 2012-06-07 DIAGNOSIS — L259 Unspecified contact dermatitis, unspecified cause: Secondary | ICD-10-CM

## 2012-06-07 DIAGNOSIS — L309 Dermatitis, unspecified: Secondary | ICD-10-CM

## 2012-06-07 DIAGNOSIS — J189 Pneumonia, unspecified organism: Principal | ICD-10-CM

## 2012-06-07 DIAGNOSIS — C8319 Mantle cell lymphoma, extranodal and solid organ sites: Secondary | ICD-10-CM

## 2012-06-07 LAB — BASIC METABOLIC PANEL
BUN: 20 mg/dL (ref 6–23)
Chloride: 96 mEq/L (ref 96–112)
GFR calc Af Amer: >90 mL/min (ref >90)
Potassium: 4 mEq/L (ref 3.5–5.1)

## 2012-06-07 LAB — DIFFERENTIAL
Lymphocytes Relative: 19 % (ref 12–46)
Lymphs Abs: 0.6 10*3/uL — ABNORMAL LOW (ref 0.7–4.0)
Monocytes Absolute: 0.7 10*3/uL (ref 0.1–1.0)
Monocytes Relative: 23 % — ABNORMAL HIGH (ref 3–12)
Neutro Abs: 1.6 10*3/uL — ABNORMAL LOW (ref 1.7–7.7)

## 2012-06-07 LAB — CBC
HCT: 29.7 % — ABNORMAL LOW (ref 39.0–52.0)
Hemoglobin: 10 g/dL — ABNORMAL LOW (ref 13.0–17.0)
MCH: 31.7 pg (ref 26.0–34.0)
MCHC: 33.7 g/dL (ref 30.0–36.0)
MCV: 94.3 fL (ref 78.0–100.0)
Platelets: 98 K/uL — ABNORMAL LOW (ref 150–400)
RBC: 3.15 MIL/uL — ABNORMAL LOW (ref 4.22–5.81)
RDW: 19.3 % — ABNORMAL HIGH (ref 11.5–15.5)
WBC: 2.9 K/uL — ABNORMAL LOW (ref 4.0–10.5)

## 2012-06-07 LAB — EXPECTORATED SPUTUM ASSESSMENT W GRAM STAIN, RFLX TO RESP C

## 2012-06-07 MED ORDER — DIPHENHYDRAMINE HCL 50 MG PO CAPS
50.0000 mg | ORAL_CAPSULE | Freq: Four times a day (QID) | ORAL | Status: DC | PRN
  Administered 2012-06-07 – 2012-06-08 (×4): 50 mg via ORAL
  Filled 2012-06-07 (×4): qty 1

## 2012-06-07 NOTE — Progress Notes (Signed)
Progress Note:  Subjective: Stable overnight. Persistent but intermittent cough. Chest radiograph reviewed. No infiltrate or effusion. Maximum temperature 100.5 on admission now afebrile Complains of some pruritus on his back    Vitals: Filed Vitals:   06/07/12 0635  BP: 119/63  Pulse: 78  Temp: 98.8 F (37.1 C)  Resp: 20   Wt Readings from Last 3 Encounters:  06/06/12 179 lb 6 oz (81.364 kg)  06/06/12 179 lb 6.4 oz (81.375 kg)  05/24/12 180 lb (81.647 kg)     PHYSICAL EXAM:   General Alert, oriented, mild dyspnea at rest Head: Normal Eyes: Normal Throat: No erythema or exudate Neck: Full range of motion Lymph Nodes: No adenopathy Lungs: Increased coarse rales left lung base Breasts:  Cardiac: Regular rhythm. 2-3/6 systolic murmur at the apex and left sternal border Abdominal: Soft nontender Extremities: Chronic 1+ edema no calf tenderness Vascular:  No cyanosis Neurologic currently no focal deficit in mental status is intact Skin: Diaphoretic on his back, acneform rash  Labs:   [facility] 06/07/12 0325 06/06/12 1057  WBC 2.9* 4.8  HGB 10.0* 10.9*  HCT 29.7* 33.0*  PLT 98* 95*    Basename 06/07/12 0325 06/06/12 1057  NA 129* 134*  K 4.0 4.7  CL 96 97  CO2 25 26  GLUCOSE 113* 133*  BUN 20 23  CREATININE 0.83 1.04  CALCIUM 9.5 9.7      Images Studies/Results:   X-ray Chest Pa And Lateral   06/06/2012  *RADIOLOGY REPORT*  Clinical Data: Cough, chest pain, lymphoma on chemotherapy  CHEST - 2 VIEW  Comparison: 05/02/2012  Findings: Minimal enlargement of cardiac silhouette post CABG. Atherosclerotic calcification aorta. Pulmonary vascularity normal. Emphysematous and bronchitic changes. Question mitral annular calcification. No acute infiltrate, pleural effusion or pneumothorax. Bones appear demineralized.  IMPRESSION: Minimal enlargement of cardiac silhouette post CABG. Emphysematous and chronic bronchitic changes. Improved bibasilar aeration versus  previous exam.  Original Report Authenticated By: Lollie Marrow, M.D.     Patient Active Problem List  Diagnosis  . DIABETES MELLITUS, TYPE II  . HYPERLIPIDEMIA  . HYPERTENSION  . CEREBROVASCULAR DISEASE  . Coronary artery disease  . Carotid bruit  . Chest pain at rest  . Pedal edema  . Anemia  . Thrombocytopenia  . Moderate aortic stenosis  . Neuralgia  . BPH (benign prostatic hyperplasia)  . Hyperlipidemia  . Unspecified deficiency anemia  . Lymphoma  . Splenomegaly  . History of blood transfusion  . Mantle cell lymphoma of intra-abdominal lymph nodes  . Pneumonitis    Assessment and Plan:  #1. Acute pneumonitis Stable on current antibiotics. No pneumonic infiltrate on x-ray but persistent findings on clinical exam to suggest active pulmonary infection. Plan: Continue current antibiotics and antitussives. Cultures are pending.  #2. Mantle cell lymphoma Currently under active treatment with Treanda chemotherapy Fall in white count since admission but no absolute granulocytopenia at this time.  #3. Pancytopenia secondary to chemotherapy and splenomegaly from lymphoma  #4. Known coronary artery disease  #5. Obstructive airway disease  #6. Transient confusion secondary to fever now resolved  #7. Dermatitis/pruritus Likely due to diaphoresis with local irritation of the skin of his back. Plan: Benadryl for the itching and topical powder on his back.    Aundraya Dripps M 06/07/2012, 7:26 AM

## 2012-06-07 NOTE — Progress Notes (Signed)
UR done. 

## 2012-06-08 ENCOUNTER — Other Ambulatory Visit: Payer: Self-pay | Admitting: Oncology

## 2012-06-08 LAB — URINE CULTURE: Culture: NO GROWTH

## 2012-06-08 MED ORDER — KCL IN DEXTROSE-NACL 20-5-0.45 MEQ/L-%-% IV SOLN
INTRAVENOUS | Status: DC
  Administered 2012-06-08: 50 mL/h via INTRAVENOUS
  Administered 2012-06-09: 06:00:00 via INTRAVENOUS
  Filled 2012-06-08 (×2): qty 1000

## 2012-06-08 NOTE — Progress Notes (Signed)
Progress Note:  Subjective: He remains afebrile day #3 [place]. Cough is decreasing. No dyspnea at rest. Oxygen saturations greater than or equal to 92% on room air. Sputum culture results still pending. Urine cultures negative. Blood cultures negative to date.    Vitals: Filed Vitals:   06/08/12 0447  BP: 148/68  Pulse: 75  Temp: 98.6 F (37 C)  Resp: 16   Wt Readings from Last 3 Encounters:  06/06/12 179 lb 6 oz (81.364 kg)  06/06/12 179 lb 6.4 oz (81.375 kg)  05/24/12 180 lb (81.647 kg)     PHYSICAL EXAM:  General he appears much more comfortable. Head: Normal Eyes: Normal Throat: No erythema or exudate Neck: Full range of motion Lymph Nodes: Lungs: Decreased rales and rhonchi left lung base compared with prior exam Breasts:  Cardiac: Regular rhythm 1-2/6 apical systolic murmur Abdominal: Soft nontender Extremities: No edema no calf tenderness Vascular: No cyanosis  Neurologic grossly normal he is alert and oriented Skin: No rash or ecchymosis. Dermatitis on his back is resolving.   Labs:   [facility] 06/07/12 0325 06/06/12 1057  WBC 2.9* 4.8  HGB 10.0* 10.9*  HCT 29.7* 33.0*  PLT 98* 95*    Basename 06/07/12 0325 06/06/12 1057  NA 129* 134*  K 4.0 4.7  CL 96 97  CO2 25 26  GLUCOSE 113* 133*  BUN 20 23  CREATININE 0.83 1.04  CALCIUM 9.5 9.7      Images Studies/Results:   X-ray Chest Pa And Lateral   06/06/2012  *RADIOLOGY REPORT*  Clinical Data: Cough, chest pain, lymphoma on chemotherapy  CHEST - 2 VIEW  Comparison: 05/02/2012  Findings: Minimal enlargement of cardiac silhouette post CABG. Atherosclerotic calcification aorta. Pulmonary vascularity normal. Emphysematous and bronchitic changes. Question mitral annular calcification. No acute infiltrate, pleural effusion or pneumothorax. Bones appear demineralized.  IMPRESSION: Minimal enlargement of cardiac silhouette post CABG. Emphysematous and chronic bronchitic changes. Improved bibasilar aeration  versus previous exam.  Original Report Authenticated By: Lollie Marrow, M.D.     Patient Active Problem List  Diagnosis  . DIABETES MELLITUS, TYPE II  . HYPERLIPIDEMIA  . HYPERTENSION  . CEREBROVASCULAR DISEASE  . Coronary artery disease  . Carotid bruit  . Chest pain at rest  . Pedal edema  . Anemia  . Thrombocytopenia  . Moderate aortic stenosis  . Neuralgia  . BPH (benign prostatic hyperplasia)  . Hyperlipidemia  . Unspecified deficiency anemia  . Lymphoma  . Splenomegaly  . History of blood transfusion  . Mantle cell lymphoma of intra-abdominal lymph nodes  . Pneumonitis  . Dermatitis    Assessment and Plan:  #1. Acute pneumonitis  Stable on current antibiotics. He is improving clinically.  Plan: Continue current antibiotics and antitussives. If he continues to do well I will consider discharge tomorrow and continue a course of oral antibiotics as an outpatient. #2. Mantle cell lymphoma  Currently under active treatment with Treanda chemotherapy  Fall in white count since admission but no absolute granulocytopenia at this time. No new labs today #3. Pancytopenia secondary to chemotherapy and splenomegaly from lymphoma  #4. Known coronary artery disease  #5. Obstructive airway disease  #6. Transient confusion secondary to fever now resolved  #7. Dermatitis/pruritus improving with local care.     Bethan Adamek M 06/08/2012, 7:18 AM

## 2012-06-09 ENCOUNTER — Ambulatory Visit: Payer: Medicare Other | Admitting: Oncology

## 2012-06-09 ENCOUNTER — Other Ambulatory Visit: Payer: Medicare Other | Admitting: Lab

## 2012-06-09 ENCOUNTER — Encounter: Payer: Self-pay | Admitting: Oncology

## 2012-06-09 ENCOUNTER — Telehealth: Payer: Self-pay | Admitting: *Deleted

## 2012-06-09 LAB — BASIC METABOLIC PANEL
BUN: 19 mg/dL (ref 6–23)
CO2: 26 mEq/L (ref 19–32)
Calcium: 9.7 mg/dL (ref 8.4–10.5)
Chloride: 96 mEq/L (ref 96–112)
Creatinine, Ser: 0.85 mg/dL (ref 0.50–1.35)
Glucose, Bld: 130 mg/dL — ABNORMAL HIGH (ref 70–99)

## 2012-06-09 LAB — DIFFERENTIAL
Basophils Absolute: 0 10*3/uL (ref 0.0–0.1)
Eosinophils Relative: 5 % (ref 0–5)
Lymphocytes Relative: 14 % (ref 12–46)
Lymphs Abs: 0.5 10*3/uL — ABNORMAL LOW (ref 0.7–4.0)
Monocytes Absolute: 1 10*3/uL (ref 0.1–1.0)
Monocytes Relative: 30 % — ABNORMAL HIGH (ref 3–12)
Neutro Abs: 1.7 10*3/uL (ref 1.7–7.7)

## 2012-06-09 LAB — CBC
MCV: 93.6 fL (ref 78.0–100.0)
RBC: 3.62 MIL/uL — ABNORMAL LOW (ref 4.22–5.81)
RDW: 19.2 % — ABNORMAL HIGH (ref 11.5–15.5)

## 2012-06-09 MED ORDER — CIPROFLOXACIN HCL 500 MG PO TABS: 500.0000 mg | ORAL_TABLET | Freq: Two times a day (BID) | ORAL | Status: AC

## 2012-06-09 NOTE — Progress Notes (Signed)
Reviewed D/C papers with pt, D/C'ed IV. Pt being discharged to home with spouse. [name]

## 2012-06-09 NOTE — Progress Notes (Signed)
78 year old man under active treatment for mantle cell lymphoma with single agent Treanda. I had to admit him to the hospital earlier this week on Tuesday for early pneumonia. He did very well. I discharged him this morning. He will be able to proceed with planned chemotherapy on Monday, July 8 and July 9. I rescheduled his office visit for July 22 at his nadir.

## 2012-06-09 NOTE — Telephone Encounter (Signed)
Per staff messAge I have scheduled appts. JMW

## 2012-06-09 NOTE — Discharge Summary (Signed)
Physician Discharge Summary  Patient ID: [name] MRN: [id number] DOB/AGE: 1927-01-07 78 y.o.  Admit date: 06/06/2012 Discharge date: 06/09/2012  Admission Diagnoses: #1 acute pneumonitis with fever and cough #2. Mantle cell lymphoma under active treatment with chemotherapy #3. Pancytopenia secondary to #2 and to chemotherapy. #4. Obstructive airway disease. #5. Coronary artery disease with chronic congestive heart failure. #6. Cerebrovascular disease #7. Type 2 diabetes on oral agent  Hospital Course:  78 year old man with hypertension, coronary artery disease, obstructive airways disease, cerebrovascular disease, valvular heart disease, type 2 diabetes, who presented in April of this year with a one-month history of  progressive anemia, weakness, weight loss, then pancytopenia and splenomegaly  as first sign of non-Hodgkin's lymphoma. Diagnosis established by a core needle biopsy of a right iliac lymph node on 03/28/2012. His overall medical condition is very fragile. I elected to treat him with single agent chemotherapy at attenuated doses without the addition of Rituxan antibody in attempt to get a partial response, stabilization of his condition, then consideration of advancing to additional treatment. He is currently being treated with single agent Treanda at 70 mg per meter squared day 1 day 2 on a every 28 day schedule and has completed 2 cycles the most recent given on June 10 and June 11. He presented on the day of the current admission with acute onset of bronchitis with productive sputum, fever to 102, and intermittent confusion. Findings on physical examination were consistent with an early pneumonia with  coarse  rales at the left lung base. He was pancytopenic from chemotherapy. In view of his age, multiple medical problems, immunocompromised status, and clinical suspicion of pneumonia, he was admitted to the hospital for further evaluation and treatment.  Chest radiograph  was done and surprisingly did not show any infiltrate or effusion. He has chronic cardiomegaly and coronary vascular calcifications. No overt signs or symptoms of failure. Cultures were obtained and he was started on broad-spectrum parenteral antibiotics with Elita Quick. He was given antitussives and as needed oxygen. Blood counts were monitored. His glucose lowering drugs were held. Sugars were checked on a daily basis. Peak glucose during the entire admission was only as high as 133. No white cells were seen in the sputum and no organisms on culture. Urine and blood cultures also remained sterile. He had a prompt stabilization on antibiotics. Cough slowly resolve. Oxygenation remained stable. Blood counts began to recover. Nadir white count was 2900 on July 3 with a platelet count of 98,000 and hemoglobin of 10. At time of discharge on July 5 white count 3400 with 51% neutrophils 30% monocytes hemoglobin 11.2 and platelets 118,000. Exam initially worse over the first 24 hours with increasing rhonchi and rales at the left lung base but then with gradual clearing. He had an acneiform rash on his back on admission which was causing discomfort due to itching. Benadryl and local care was prescribed in the rash resolved. No other problems were identified. There were no complications. Given rapid improvement I felt he was stable for discharge to continue an additional 5 days of oral antibiotics as an outpatient    Discharge Labs:   Lab 06/09/12 0400 06/07/12 0325 06/06/12 1057  WBC 3.4* 2.9* 4.8  HGB 11.2* 10.0* 10.9*  HCT 33.9* 29.7* 33.0*  PLT 118* 98* 95*  NEUTOPHILPCT 51 54 65.8  MONOPCT 30* 23* 22.2*    Lab 06/09/12 0400 06/07/12 0325 06/06/12 1057  NA 130* 129* 134*  K 4.0 4.0 4.7  CL 96 96  97  CO2 26 25 26   BUN 19 20 23   CREATININE 0.85 0.83 1.04  CALCIUM 9.7 9.5 9.7  PROT -- -- 5.9*  BILITOT -- -- 0.7  ALKPHOS -- -- 118*  ALT -- -- 14  AST -- -- 29  GLUCOSE 130* 113* 133*        Consults: None   Procedures: None   Discharge Diagnoses: Active Problems:  Pneumonitis  Dermatitis  Mantle cell lymphoma clinical stage IIb (patient never had a bone marrow)  Pancytopenia secondary to chemotherapy and known splenomegaly from mantle cell lymphoma  Current are disease with chronic congestive heart failure  Valvular heart disease with aortic stenosis  Obstructive airway disease  History of recent herpes zoster infection affecting ophthalmic nerve.  Type 2 diabetes  Essential hypertension controlled on current medication  Disposition: He'll resume his regular activity. Low sodium diet. He is stable to continue his chemotherapy as scheduled on Monday, July 8. Followup appointment with M.D./nurse practitioner on Monday, July 22   Medications: Cipro 500 mg by mouth twice a day x5 days Acyclovir 400 mg twice a day Enteric-coated aspirin 81 mg daily Albuterol inhaler 2 puffs every 6 hours when necessary Lasix 40 mg by mouth daily (Of note he is not taking a potassium supplement serum potassium levels during this admission consistently at 4.0) Neurontin 300 mg 2 tablets twice a day Imdur 30 mg by mouth daily Cozaar 100 mg daily Mobic 15 mg daily Metoprolol 25 mg long-acting tablet one daily (Toprol-XL) Nitrostat 0.4 mg sublingual when necessary chest pain Pravachol 40 mg daily Zofran ODT 8 mg sublingual Q8 hours when necessary nausea vomiting B12 capsule 500 mcg by mouth daily Vicodin 5/325 one every 8 hours when necessary pain Hycodan,  cough syrup 5 ML every 6 hours when necessary  I'm going to stop his Glucophage since blood sugars under direct observation were only borderline elevated and his oral intake has decreased along with his weight. He is instructed to also stop allopurinol which was only needed for the first month during his treatment to prevent tumor lysis syndrome. I asked him and his wife to review with their primary care physician whether or  not he should be put on a potassium supplement since he is on Lasix 40 mg daily.         SignedLevert [name] 06/09/2012, 7:59 AM

## 2012-06-10 LAB — CULTURE, RESPIRATORY W GRAM STAIN: Gram Stain: NONE SEEN

## 2012-06-12 ENCOUNTER — Ambulatory Visit (HOSPITAL_BASED_OUTPATIENT_CLINIC_OR_DEPARTMENT_OTHER): Payer: Medicare Other

## 2012-06-12 VITALS — BP 114/62 | HR 71 | Temp 96.8°F

## 2012-06-12 DIAGNOSIS — D696 Thrombocytopenia, unspecified: Secondary | ICD-10-CM

## 2012-06-12 DIAGNOSIS — C8590 Non-Hodgkin lymphoma, unspecified, unspecified site: Secondary | ICD-10-CM

## 2012-06-12 DIAGNOSIS — Z5111 Encounter for antineoplastic chemotherapy: Secondary | ICD-10-CM

## 2012-06-12 DIAGNOSIS — C8589 Other specified types of non-Hodgkin lymphoma, extranodal and solid organ sites: Secondary | ICD-10-CM

## 2012-06-12 LAB — CULTURE, BLOOD (SINGLE)

## 2012-06-12 MED ORDER — SODIUM CHLORIDE 0.9 % IV SOLN: Freq: Once | INTRAVENOUS | Status: DC

## 2012-06-12 MED ORDER — DEXAMETHASONE SODIUM PHOSPHATE 10 MG/ML IJ SOLN
10.0000 mg | Freq: Once | INTRAMUSCULAR | Status: AC
  Administered 2012-06-12: 10 mg via INTRAVENOUS

## 2012-06-12 MED ORDER — ONDANSETRON 8 MG/50ML IVPB (CHCC)
8.0000 mg | Freq: Once | INTRAVENOUS | Status: AC
  Administered 2012-06-12: 8 mg via INTRAVENOUS

## 2012-06-12 MED ORDER — DIPHENHYDRAMINE HCL 25 MG PO CAPS
50.0000 mg | ORAL_CAPSULE | Freq: Once | ORAL | Status: AC
  Administered 2012-06-12: 50 mg via ORAL

## 2012-06-12 MED ORDER — ACETAMINOPHEN 325 MG PO TABS
650.0000 mg | ORAL_TABLET | Freq: Once | ORAL | Status: AC
  Administered 2012-06-12: 650 mg via ORAL

## 2012-06-12 MED ORDER — SODIUM CHLORIDE 0.9 % IV SOLN
70.0000 mg/m2 | Freq: Once | INTRAVENOUS | Status: AC
  Administered 2012-06-12: 155 mg via INTRAVENOUS
  Filled 2012-06-12: qty 31

## 2012-06-13 ENCOUNTER — Ambulatory Visit (HOSPITAL_BASED_OUTPATIENT_CLINIC_OR_DEPARTMENT_OTHER): Payer: Medicare Other

## 2012-06-13 VITALS — BP 112/59 | HR 62 | Temp 98.2°F

## 2012-06-13 DIAGNOSIS — C8590 Non-Hodgkin lymphoma, unspecified, unspecified site: Secondary | ICD-10-CM

## 2012-06-13 DIAGNOSIS — D696 Thrombocytopenia, unspecified: Secondary | ICD-10-CM

## 2012-06-13 DIAGNOSIS — Z5111 Encounter for antineoplastic chemotherapy: Secondary | ICD-10-CM

## 2012-06-13 DIAGNOSIS — C8313 Mantle cell lymphoma, intra-abdominal lymph nodes: Secondary | ICD-10-CM

## 2012-06-13 MED ORDER — SODIUM CHLORIDE 0.9 % IV SOLN
70.0000 mg/m2 | Freq: Once | INTRAVENOUS | Status: AC
  Administered 2012-06-13: 155 mg via INTRAVENOUS
  Filled 2012-06-13: qty 31

## 2012-06-13 MED ORDER — ONDANSETRON 8 MG/50ML IVPB (CHCC)
8.0000 mg | Freq: Once | INTRAVENOUS | Status: AC
  Administered 2012-06-13: 8 mg via INTRAVENOUS

## 2012-06-13 MED ORDER — HEPARIN SOD (PORK) LOCK FLUSH 100 UNIT/ML IV SOLN
500.0000 [IU] | Freq: Once | INTRAVENOUS | Status: DC | PRN
  Filled 2012-06-13: qty 5

## 2012-06-13 MED ORDER — SODIUM CHLORIDE 0.9 % IV SOLN
Freq: Once | INTRAVENOUS | Status: AC
  Administered 2012-06-13: 10:00:00 via INTRAVENOUS

## 2012-06-13 MED ORDER — SODIUM CHLORIDE 0.9 % IJ SOLN
10.0000 mL | INTRAMUSCULAR | Status: DC | PRN
  Filled 2012-06-13: qty 10

## 2012-06-13 MED ORDER — DEXAMETHASONE SODIUM PHOSPHATE 10 MG/ML IJ SOLN
10.0000 mg | Freq: Once | INTRAMUSCULAR | Status: AC
  Administered 2012-06-13: 10 mg via INTRAVENOUS

## 2012-06-14 NOTE — Progress Notes (Signed)
The patient was just discharged from the hospital this morning. Office visit will be rescheduled. This is the third time the computer has asked me to do this and note.

## 2012-06-14 NOTE — Progress Notes (Signed)
The patient was just discharged from the hospital this morning. Office visit will be rescheduled.

## 2012-06-15 ENCOUNTER — Telehealth: Payer: Self-pay | Admitting: Oncology

## 2012-06-15 NOTE — Telephone Encounter (Signed)
Talked to pt's wife gave her appt CT, advised her to get calendar for the rest of the appts, pt will come and get contrast before CT

## 2012-06-16 ENCOUNTER — Encounter: Payer: Self-pay | Admitting: *Deleted

## 2012-06-21 ENCOUNTER — Other Ambulatory Visit (HOSPITAL_BASED_OUTPATIENT_CLINIC_OR_DEPARTMENT_OTHER): Payer: Medicare Other | Admitting: Lab

## 2012-06-21 ENCOUNTER — Other Ambulatory Visit (HOSPITAL_COMMUNITY): Payer: Medicare Other

## 2012-06-21 DIAGNOSIS — R609 Edema, unspecified: Secondary | ICD-10-CM

## 2012-06-21 DIAGNOSIS — C8313 Mantle cell lymphoma, intra-abdominal lymph nodes: Secondary | ICD-10-CM

## 2012-06-21 LAB — COMPREHENSIVE METABOLIC PANEL
ALT: 12 U/L (ref 0–53)
AST: 20 U/L (ref 0–37)
BUN: 18 mg/dL (ref 6–23)
Calcium: 9.3 mg/dL (ref 8.4–10.5)
Chloride: 102 mEq/L (ref 96–112)
Creatinine, Ser: 0.77 mg/dL (ref 0.50–1.35)
Total Bilirubin: 0.8 mg/dL (ref 0.3–1.2)

## 2012-06-21 LAB — CBC WITH DIFFERENTIAL/PLATELET
BASO%: 0.8 % (ref 0.0–2.0)
Basophils Absolute: 0 10*3/uL (ref 0.0–0.1)
EOS%: 4.1 % (ref 0.0–7.0)
HCT: 32.5 % — ABNORMAL LOW (ref 38.4–49.9)
HGB: 10.9 g/dL — ABNORMAL LOW (ref 13.0–17.1)
LYMPH%: 25.7 % (ref 14.0–49.0)
MCH: 32.4 pg (ref 27.2–33.4)
MCHC: 33.5 g/dL (ref 32.0–36.0)
MCV: 96.9 fL (ref 79.3–98.0)
NEUT%: 43.9 % (ref 39.0–75.0)
Platelets: 74 10*3/uL — ABNORMAL LOW (ref 140–400)
lymph#: 0.7 10*3/uL — ABNORMAL LOW (ref 0.9–3.3)

## 2012-06-21 LAB — LACTATE DEHYDROGENASE: LDH: 180 U/L (ref 94–250)

## 2012-06-23 ENCOUNTER — Other Ambulatory Visit: Payer: Self-pay | Admitting: Oncology

## 2012-06-23 ENCOUNTER — Ambulatory Visit (HOSPITAL_COMMUNITY)
Admission: RE | Admit: 2012-06-23 | Discharge: 2012-06-23 | Disposition: A | Discharge status: Home / Self Care | Payer: Medicare Other | Source: Ambulatory Visit | Attending: Oncology | Admitting: Oncology

## 2012-06-23 DIAGNOSIS — R161 Splenomegaly, not elsewhere classified: Secondary | ICD-10-CM | POA: Insufficient documentation

## 2012-06-23 DIAGNOSIS — R599 Enlarged lymph nodes, unspecified: Secondary | ICD-10-CM | POA: Insufficient documentation

## 2012-06-23 DIAGNOSIS — N200 Calculus of kidney: Secondary | ICD-10-CM | POA: Insufficient documentation

## 2012-06-23 DIAGNOSIS — R911 Solitary pulmonary nodule: Secondary | ICD-10-CM | POA: Insufficient documentation

## 2012-06-23 DIAGNOSIS — C8313 Mantle cell lymphoma, intra-abdominal lymph nodes: Secondary | ICD-10-CM | POA: Insufficient documentation

## 2012-06-23 MED ORDER — IOHEXOL 300 MG/ML  SOLN
100.0000 mL | Freq: Once | INTRAMUSCULAR | Status: AC | PRN
  Administered 2012-06-23: 100 mL via INTRAVENOUS

## 2012-06-26 ENCOUNTER — Ambulatory Visit (HOSPITAL_BASED_OUTPATIENT_CLINIC_OR_DEPARTMENT_OTHER): Payer: Medicare Other | Admitting: Oncology

## 2012-06-26 VITALS — BP 128/53 | HR 60 | Temp 97.4°F | Ht 73.0 in | Wt 176.6 lb

## 2012-06-26 DIAGNOSIS — C8313 Mantle cell lymphoma, intra-abdominal lymph nodes: Secondary | ICD-10-CM

## 2012-06-26 NOTE — Progress Notes (Signed)
Hematology and Oncology Follow Up Visit  [name] [id number] 1927-04-29 78 y.o. 06/26/2012 6:42 PM   Principle Diagnosis: Encounter Diagnosis  Name Primary?  . Mantle cell lymphoma of intra-abdominal lymph nodes Yes     Interim History:  Short interim followup visit for this 78 year old man diagnosed with mantle cell lymphoma in April of this year when he presented with progressive anemia, weakness, weight loss, pancytopenia, and splenomegaly. Diagnosis established by core needle biopsy of a right iliac lymph node on 03/28/2012. Main sites of disease are intra-abdominal and iliac. Overall medical condition is very tenuous. I elected to treat him with single agent Treanda at a dose of 70 mg per meter squared 2 days in a row once a month. Although he has tolerated the chemotherapy well except for some delayed nausea, some of his other medical problems remain active. He developed significant peripheral edema. This is finally under control.  He was admitted briefly to the hospital with pneumonitis July 2 through July 5. He responded promptly to antibiotics. Fortunately he was not neutropenic. Although he had coarse rales and rhonchi at the left lung base, there were no infiltrates on chest radiograph. I was able to keep him on time for his third cycle of chemotherapy.  He was restaged in anticipation of today's visit following 3 cycles of chemotherapy. I'm pleased to report that he has had a nice response with reduction in all left gland areas and resolution of the iliac adenopathy. Largest hepatoduodenal node decreased from maximum dimension of 3.7 x 3.1 cm to 1.5 x 2.5 cm. Spleen size decreased 1 cm in transverse diameter and 2 cm in craniocaudal diameter compared with pretreatment study.  He has had a dramatic improvement in his performance status just since his last visit.  Medications: reviewed  Allergies:  Allergies  Allergen Reactions  . Azithromycin Itching  . Macrodantin  Itching  . Minocycline Hcl Itching  . Nitrofurantoin Itching  . Zithromax (Azithromycin Dihydrate) Itching  . Contrast Media (Iodinated Diagnostic Agents) Rash    Review of Systems: Constitutional:   Resolved constitutional symptoms Respiratory: No cough or dyspnea Cardiovascular:  No chest pain or palpitations Gastrointestinal: No change in bowel habit Genito-Urinary: No urinary tract symptoms Musculoskeletal: No muscle or bone pain Neurologic: No headache or change in vision Skin: No rash Remaining ROS negative. Resolved peripheral edema  Physical Exam: Blood pressure 128/53, pulse 60, temperature 97.4 F (36.3 C), temperature source Oral, height 6\' 1"  (1.854 m), weight 176 lb 9.6 oz (80.105 kg). Wt Readings from Last 3 Encounters:  06/26/12 176 lb 9.6 oz (80.105 kg)  06/06/12 179 lb 6 oz (81.364 kg)  06/06/12 179 lb 6.4 oz (81.375 kg)     General appearance: Thin elderly Caucasian man HENNT: Pharynx no erythema exudate or ulcer Lymph nodes: No cervical, supraclavicular, axillary, or inguinal adenopathy Breasts: Lungs: Clear to auscultation and resonant to percussion throughout Heart: Regular rhythm Abdomen: Soft nontender. I am no longer able to feel his spleen Extremities: Resolved peripheral edema Vascular: No cyanosis Neurologic: Motor strength 5 over 5 reflexes 1+ symmetric Skin: No rash or ecchymosis  Lab Results: Lab Results  Component Value Date   WBC 2.8* 06/21/2012   HGB 10.9* 06/21/2012   HCT 32.5* 06/21/2012   MCV 96.9 06/21/2012   PLT 74* 06/21/2012     Chemistry      Component Value Date/Time   NA 138 06/21/2012 1047   K 3.9 06/21/2012 1047   CL 102 06/21/2012 1047  CO2 26 06/21/2012 1047   BUN 18 06/21/2012 1047   CREATININE 0.77 06/21/2012 1047      Component Value Date/Time   CALCIUM 9.3 06/21/2012 1047   ALKPHOS 93 06/21/2012 1047   AST 20 06/21/2012 1047   ALT 12 06/21/2012 1047   BILITOT 0.8 06/21/2012 1047       Radiological Studies: X-ray  Chest Pa And Lateral   06/06/2012  *RADIOLOGY REPORT*  Clinical Data: Cough, chest pain, lymphoma on chemotherapy  CHEST - 2 VIEW  Comparison: 05/02/2012  Findings: Minimal enlargement of cardiac silhouette post CABG. Atherosclerotic calcification aorta. Pulmonary vascularity normal. Emphysematous and bronchitic changes. Question mitral annular calcification. No acute infiltrate, pleural effusion or pneumothorax. Bones appear demineralized.  IMPRESSION: Minimal enlargement of cardiac silhouette post CABG. Emphysematous and chronic bronchitic changes. Improved bibasilar aeration versus previous exam.  Original Report Authenticated By: Lollie Marrow, M.D.   Ct Chest W Contrast  06/23/2012  *RADIOLOGY REPORT*  Clinical Data:  History of mantle cell lymphoma of intra abdominal lymph nodes.  On therapy.  No complaints.  History of right-sided renal surgery for stones.  CT CHEST, ABDOMEN AND PELVIS WITH CONTRAST  Technique: Contiguous axial images of the chest abdomen and pelvis were obtained after IV contrast administration.  Contrast: 100  ml Omnipaque-300  Comparison: PET of 03/28/2012.  Chest CT of 03/28/2012.  Abdominal pelvic CT of 03/21/2012.  CT CHEST  Findings: Lung windows demonstrate minimal secretions within the dependent trachea on image 17.  Minimal motion degradation inferiorly.  Perifissural vague superior segment right lower lobe 7 mm nodule on image 30 is unchanged. Minimal ground-glass opacity within the posterior right upper lobe on image 14 is felt to be similar.  This measures maximally 7 mm.  Soft tissue windows demonstrate no supraclavicular adenopathy. Prior median sternotomy.  Tortuous descending thoracic aorta. Normal heart size with mitral annular calcifications.  Resolution of right-sided pleural effusion with trace left-sided pleural fluid or thickening remaining. No central pulmonary embolism, on this non- dedicated study.  Resolution of bilateral axillary adenopathy. No mediastinal or  hilar adenopathy.  IMPRESSION:  1.  Response to therapy of bilateral axillary adenopathy. 2.  No residual thoracic adenopathy identified. 3.  Resolution of right-sided pleural effusion.  Trace left pleural fluid or thickening remains. 4.  Similar pulmonary nodules x2.  CT ABDOMEN AND PELVIS  Findings:  Normal liver.  Splenomegaly is improved.  The spleen measures 12.9 cm in greatest transverse dimension today versus 14.4 cm on the prior.  16.1 cm cranial caudal today versus 18.0 on the prior.  Normal stomach, pancreas, gallbladder, biliary tract, adrenal glands.  Left-sided nonobstructive renal calculi, with small left renal lesions, likely cysts.  Dense atherosclerosis at the origin of the renal arteries.  Resolved retroperitoneal adenopathy.  Improved porta hepatis adenopathy. -  An index hepatoduodenal ligament node measures 1.5 x 2.5 cm on image 67 versus 3.7 x 3.1 cm on the 03/21/2012.   No retrocrural adenopathy. -  The area of high omental nodularity measures 1.8 x 1.1 cm on image 60 versus 2.3 x 1.2 cm on the prior exam.  Small volume ascites is resolved. Scattered colonic diverticula. There are contrast filled diverticula, including adjacent the transverse colon on image 55. Normal terminal ileum and appendix. Normal small bowel without abdominal ascites.  Resolution of pelvic adenopathy. -  Index right external iliac node measures 8 mm on image 121 versus 2.1 cm on the prior exam. Normal urinary bladder and prostate.  No significant free  fluid.  IMPRESSION:  1.  Response to therapy of lymphoma.  Resolution of pelvic and improved abdominal adenopathy. 2.  Decreased splenomegaly. 3.  Decreased size of omental nodule, with resolution of perihepatic ascites. 4.  Left nephrolithiasis.  Original Report Authenticated By: Consuello Bossier, M.D.   Ct Abdomen Pelvis W Contrast  06/23/2012  *RADIOLOGY REPORT*  Clinical Data:  History of mantle cell lymphoma of intra abdominal lymph nodes.  On therapy.  No  complaints.  History of right-sided renal surgery for stones.  CT CHEST, ABDOMEN AND PELVIS WITH CONTRAST  Technique: Contiguous axial images of the chest abdomen and pelvis were obtained after IV contrast administration.  Contrast: 100  ml Omnipaque-300  Comparison: PET of 03/28/2012.  Chest CT of 03/28/2012.  Abdominal pelvic CT of 03/21/2012.  CT CHEST  Findings: Lung windows demonstrate minimal secretions within the dependent trachea on image 17.  Minimal motion degradation inferiorly.  Perifissural vague superior segment right lower lobe 7 mm nodule on image 30 is unchanged. Minimal ground-glass opacity within the posterior right upper lobe on image 14 is felt to be similar.  This measures maximally 7 mm.  Soft tissue windows demonstrate no supraclavicular adenopathy. Prior median sternotomy.  Tortuous descending thoracic aorta. Normal heart size with mitral annular calcifications.  Resolution of right-sided pleural effusion with trace left-sided pleural fluid or thickening remaining. No central pulmonary embolism, on this non- dedicated study.  Resolution of bilateral axillary adenopathy. No mediastinal or hilar adenopathy.  IMPRESSION:  1.  Response to therapy of bilateral axillary adenopathy. 2.  No residual thoracic adenopathy identified. 3.  Resolution of right-sided pleural effusion.  Trace left pleural fluid or thickening remains. 4.  Similar pulmonary nodules x2.  CT ABDOMEN AND PELVIS  Findings:  Normal liver.  Splenomegaly is improved.  The spleen measures 12.9 cm in greatest transverse dimension today versus 14.4 cm on the prior.  16.1 cm cranial caudal today versus 18.0 on the prior.  Normal stomach, pancreas, gallbladder, biliary tract, adrenal glands.  Left-sided nonobstructive renal calculi, with small left renal lesions, likely cysts.  Dense atherosclerosis at the origin of the renal arteries.  Resolved retroperitoneal adenopathy.  Improved porta hepatis adenopathy. -  An index hepatoduodenal  ligament node measures 1.5 x 2.5 cm on image 67 versus 3.7 x 3.1 cm on the 03/21/2012.   No retrocrural adenopathy. -  The area of high omental nodularity measures 1.8 x 1.1 cm on image 60 versus 2.3 x 1.2 cm on the prior exam.  Small volume ascites is resolved. Scattered colonic diverticula. There are contrast filled diverticula, including adjacent the transverse colon on image 55. Normal terminal ileum and appendix. Normal small bowel without abdominal ascites.  Resolution of pelvic adenopathy. -  Index right external iliac node measures 8 mm on image 121 versus 2.1 cm on the prior exam. Normal urinary bladder and prostate.  No significant free fluid.  IMPRESSION:  1.  Response to therapy of lymphoma.  Resolution of pelvic and improved abdominal adenopathy. 2.  Decreased splenomegaly. 3.  Decreased size of omental nodule, with resolution of perihepatic ascites. 4.  Left nephrolithiasis.  Original Report Authenticated By: Consuello Bossier, M.D.    Impression and Plan: #1. Mantle cell lymphoma clinical stage III(not staging bone marrow biopsy done at patient's request). He is responding to single agent Treanda. I discussed adding Rituxan to the chemotherapy since his performance status has improved. He is finally feeling good and doesn't want to rock the boat. I think  it is safe to just reserve the Rituxan for a later date. I will continue with an additional 3 cycles of the Treanda. I will keep him at the current dose for cycle 4 but consider dose escalation to 90 mg per meter squared.  #2. Recent acute pneumonitis resolved with treatment and short hospital stay  #3. Coronary artery disease with previous MI, previous bypass surgery, recent bare metal coronary stent.  #4. Likely obstructive airway disease with right heart failure due to prior smoking history.  #5. Noncalcified 8 mm left lower lobe pulmonary lesion. Observation alone at this time. Restaging CT scan in August.  #6. Thrombocytopenia secondary  to splenomegaly from lymphoma improved on treatment  #7. Type 2 diabetes.       CC:. Dr. Ivery Quale; Dr. Valera Castle   Levert Feinstein, MD 7/22/20136:42 PM

## 2012-06-27 ENCOUNTER — Other Ambulatory Visit: Payer: Self-pay | Admitting: *Deleted

## 2012-06-27 ENCOUNTER — Telehealth: Payer: Self-pay | Admitting: *Deleted

## 2012-06-27 NOTE — Telephone Encounter (Signed)
Per voice mail message from the patient's wife, the patient was scheduled for treatment on Labor Day. I have called the patient's wife back and moved appts. Wife given new dates and times. JMW

## 2012-06-27 NOTE — Telephone Encounter (Signed)
Wife called PI:RJJOACZYS on cxr versus scans for her husband.  Discussed with Dr. Cyndie Chime.  Pt. Just had scans on the 7/19 and he does not need a CXR next Monday.  So he will just come in august 5th for his  Treanda.  Wife appreciated the call back and clearing up the misunderstanding.

## 2012-07-03 ENCOUNTER — Inpatient Hospital Stay (HOSPITAL_COMMUNITY)
Admission: RE | Admit: 2012-07-03 | Discharge: 2012-07-03 | Payer: Medicare Other | Source: Ambulatory Visit | Attending: Oncology | Admitting: Oncology

## 2012-07-10 ENCOUNTER — Ambulatory Visit (HOSPITAL_BASED_OUTPATIENT_CLINIC_OR_DEPARTMENT_OTHER): Payer: Medicare Other

## 2012-07-10 ENCOUNTER — Ambulatory Visit (HOSPITAL_BASED_OUTPATIENT_CLINIC_OR_DEPARTMENT_OTHER): Payer: Medicare Other | Admitting: Lab

## 2012-07-10 VITALS — BP 128/54 | HR 63 | Temp 97.2°F | Resp 16

## 2012-07-10 DIAGNOSIS — C8313 Mantle cell lymphoma, intra-abdominal lymph nodes: Secondary | ICD-10-CM

## 2012-07-10 DIAGNOSIS — C8590 Non-Hodgkin lymphoma, unspecified, unspecified site: Secondary | ICD-10-CM

## 2012-07-10 DIAGNOSIS — C8589 Other specified types of non-Hodgkin lymphoma, extranodal and solid organ sites: Secondary | ICD-10-CM

## 2012-07-10 DIAGNOSIS — D696 Thrombocytopenia, unspecified: Secondary | ICD-10-CM

## 2012-07-10 DIAGNOSIS — Z5111 Encounter for antineoplastic chemotherapy: Secondary | ICD-10-CM

## 2012-07-10 LAB — COMPREHENSIVE METABOLIC PANEL
CO2: 26 mEq/L (ref 19–32)
Calcium: 8.9 mg/dL (ref 8.4–10.5)
Creatinine, Ser: 0.85 mg/dL (ref 0.50–1.35)
Glucose, Bld: 165 mg/dL — ABNORMAL HIGH (ref 70–99)
Sodium: 141 mEq/L (ref 135–145)
Total Bilirubin: 0.4 mg/dL (ref 0.3–1.2)
Total Protein: 4.9 g/dL — ABNORMAL LOW (ref 6.0–8.3)

## 2012-07-10 LAB — CBC WITH DIFFERENTIAL/PLATELET
HCT: 30 % — ABNORMAL LOW (ref 38.4–49.9)
LYMPH%: 17.8 % (ref 14.0–49.0)
MCH: 33.3 pg (ref 27.2–33.4)
MCHC: 33.6 g/dL (ref 32.0–36.0)
MCV: 99 fL — ABNORMAL HIGH (ref 79.3–98.0)
MONO#: 0.6 10*3/uL (ref 0.1–0.9)
NEUT%: 63.6 % (ref 39.0–75.0)
Platelets: 65 10*3/uL — ABNORMAL LOW (ref 140–400)
RBC: 3.04 10*6/uL — ABNORMAL LOW (ref 4.20–5.82)
WBC: 4.1 10*3/uL (ref 4.0–10.3)

## 2012-07-10 MED ORDER — ACETAMINOPHEN 325 MG PO TABS
650.0000 mg | ORAL_TABLET | Freq: Once | ORAL | Status: AC
  Administered 2012-07-10: 650 mg via ORAL

## 2012-07-10 MED ORDER — SODIUM CHLORIDE 0.9 % IV SOLN
70.0000 mg/m2 | Freq: Once | INTRAVENOUS | Status: AC
  Administered 2012-07-10: 155 mg via INTRAVENOUS
  Filled 2012-07-10: qty 31

## 2012-07-10 MED ORDER — DIPHENHYDRAMINE HCL 25 MG PO CAPS
50.0000 mg | ORAL_CAPSULE | Freq: Once | ORAL | Status: AC
  Administered 2012-07-10: 50 mg via ORAL

## 2012-07-10 MED ORDER — ONDANSETRON 8 MG/50ML IVPB (CHCC)
8.0000 mg | Freq: Once | INTRAVENOUS | Status: AC
  Administered 2012-07-10: 8 mg via INTRAVENOUS

## 2012-07-10 MED ORDER — DEXAMETHASONE SODIUM PHOSPHATE 10 MG/ML IJ SOLN
10.0000 mg | Freq: Once | INTRAMUSCULAR | Status: AC
  Administered 2012-07-10: 10 mg via INTRAVENOUS

## 2012-07-10 NOTE — Patient Instructions (Addendum)
[facility] Discharge Instructions for Patients Receiving Chemotherapy  Today you received the following chemotherapy agents Treanda.  To help prevent nausea and vomiting after your treatment, we encourage you to take your nausea medication.   If you develop nausea and vomiting that is not controlled by your nausea medication, call the clinic.   BELOW ARE SYMPTOMS THAT SHOULD BE REPORTED IMMEDIATELY:  *FEVER GREATER THAN 100.5 F  *CHILLS WITH OR WITHOUT FEVER  NAUSEA AND VOMITING THAT IS NOT CONTROLLED WITH YOUR NAUSEA MEDICATION  *UNUSUAL SHORTNESS OF BREATH  *UNUSUAL BRUISING OR BLEEDING  TENDERNESS IN MOUTH AND THROAT WITH OR WITHOUT PRESENCE OF ULCERS  *URINARY PROBLEMS  *BOWEL PROBLEMS  UNUSUAL RASH Items with * indicate a potential emergency and should be followed up as soon as possible.  Feel free to call the clinic you have any questions or concerns. The clinic phone number is [phone].    

## 2012-07-11 ENCOUNTER — Telehealth: Payer: Self-pay | Admitting: Oncology

## 2012-07-11 ENCOUNTER — Ambulatory Visit (HOSPITAL_BASED_OUTPATIENT_CLINIC_OR_DEPARTMENT_OTHER): Payer: Medicare Other

## 2012-07-11 VITALS — BP 123/63 | HR 63 | Temp 97.1°F | Resp 17

## 2012-07-11 DIAGNOSIS — C8313 Mantle cell lymphoma, intra-abdominal lymph nodes: Secondary | ICD-10-CM

## 2012-07-11 DIAGNOSIS — C8590 Non-Hodgkin lymphoma, unspecified, unspecified site: Secondary | ICD-10-CM

## 2012-07-11 DIAGNOSIS — D696 Thrombocytopenia, unspecified: Secondary | ICD-10-CM

## 2012-07-11 DIAGNOSIS — Z5111 Encounter for antineoplastic chemotherapy: Secondary | ICD-10-CM

## 2012-07-11 MED ORDER — SODIUM CHLORIDE 0.9 % IV SOLN
70.0000 mg/m2 | Freq: Once | INTRAVENOUS | Status: AC
  Administered 2012-07-11: 155 mg via INTRAVENOUS
  Filled 2012-07-11: qty 31

## 2012-07-11 MED ORDER — DEXAMETHASONE SODIUM PHOSPHATE 10 MG/ML IJ SOLN
10.0000 mg | Freq: Once | INTRAMUSCULAR | Status: AC
  Administered 2012-07-11: 10 mg via INTRAVENOUS

## 2012-07-11 MED ORDER — SODIUM CHLORIDE 0.9 % IV SOLN
Freq: Once | INTRAVENOUS | Status: AC
  Administered 2012-07-11: 14:00:00 via INTRAVENOUS

## 2012-07-11 MED ORDER — ONDANSETRON 8 MG/50ML IVPB (CHCC)
8.0000 mg | Freq: Once | INTRAVENOUS | Status: AC
  Administered 2012-07-11: 8 mg via INTRAVENOUS

## 2012-07-11 NOTE — Telephone Encounter (Signed)
Pt's wife came in today to get a new schedule for September. Added lb/fu for 8/16 as ordered per 7/22 pof. Wife given appt schedule for August and September.

## 2012-07-11 NOTE — Patient Instructions (Addendum)
[facility] Discharge Instructions for Patients Receiving Chemotherapy  Today you received the following chemotherapy agent Treanda.   To help prevent nausea and vomiting after your treatment, we encourage you to take your nausea medication. Begin taking it as often as prescribed for by Dr. Cyndie Chime.    If you develop nausea and vomiting that is not controlled by your nausea medication, call the clinic. If it is after clinic hours your family physician or the after hours number for the clinic or go to the Emergency Department.   BELOW ARE SYMPTOMS THAT SHOULD BE REPORTED IMMEDIATELY:  *FEVER GREATER THAN 100.5 F  *CHILLS WITH OR WITHOUT FEVER  NAUSEA AND VOMITING THAT IS NOT CONTROLLED WITH YOUR NAUSEA MEDICATION  *UNUSUAL SHORTNESS OF BREATH  *UNUSUAL BRUISING OR BLEEDING  TENDERNESS IN MOUTH AND THROAT WITH OR WITHOUT PRESENCE OF ULCERS  *URINARY PROBLEMS  *BOWEL PROBLEMS  UNUSUAL RASH Items with * indicate a potential emergency and should be followed up as soon as possible.  One of the nurses will contact you 24 hours after your treatment. Please let the nurse know about any problems that you may have experienced. Feel free to call the clinic you have any questions or concerns. The clinic phone number is [phone].   I have been informed and understand all the instructions given to me. I know to contact the clinic, my physician, or go to the Emergency Department if any problems should occur. I do not have any questions at this time, but understand that I may call the clinic during office hours   should I have any questions or need assistance in obtaining follow up care.    __________________________________________  _____________  __________ Signature of Patient or Authorized Representative            Date                   Time    __________________________________________ Nurse's Signature

## 2012-07-17 ENCOUNTER — Other Ambulatory Visit: Payer: Self-pay | Admitting: Oncology

## 2012-07-21 ENCOUNTER — Ambulatory Visit (HOSPITAL_BASED_OUTPATIENT_CLINIC_OR_DEPARTMENT_OTHER): Payer: Medicare Other | Admitting: Oncology

## 2012-07-21 ENCOUNTER — Other Ambulatory Visit (HOSPITAL_BASED_OUTPATIENT_CLINIC_OR_DEPARTMENT_OTHER): Payer: Medicare Other | Admitting: Lab

## 2012-07-21 VITALS — BP 142/66 | HR 63 | Temp 97.0°F | Resp 20 | Ht 73.0 in | Wt 183.3 lb

## 2012-07-21 DIAGNOSIS — D696 Thrombocytopenia, unspecified: Secondary | ICD-10-CM

## 2012-07-21 DIAGNOSIS — C8313 Mantle cell lymphoma, intra-abdominal lymph nodes: Secondary | ICD-10-CM

## 2012-07-21 LAB — CBC WITH DIFFERENTIAL/PLATELET
BASO%: 0.6 % (ref 0.0–2.0)
EOS%: 3.1 % (ref 0.0–7.0)
LYMPH%: 7.6 % — ABNORMAL LOW (ref 14.0–49.0)
MCHC: 33.9 g/dL (ref 32.0–36.0)
MONO#: 0.5 10*3/uL (ref 0.1–0.9)
Platelets: 74 10*3/uL — ABNORMAL LOW (ref 140–400)
RBC: 3.08 10*6/uL — ABNORMAL LOW (ref 4.20–5.82)
WBC: 3.2 10*3/uL — ABNORMAL LOW (ref 4.0–10.3)
lymph#: 0.2 10*3/uL — ABNORMAL LOW (ref 0.9–3.3)

## 2012-07-21 LAB — COMPREHENSIVE METABOLIC PANEL
ALT: 18 U/L (ref 0–53)
AST: 26 U/L (ref 0–37)
CO2: 30 mEq/L (ref 19–32)
Sodium: 137 mEq/L (ref 135–145)
Total Bilirubin: 0.5 mg/dL (ref 0.3–1.2)
Total Protein: 5.8 g/dL — ABNORMAL LOW (ref 6.0–8.3)

## 2012-07-21 NOTE — Progress Notes (Signed)
Hematology and Oncology Follow Up Visit  [name] [id number] 04-24-1927 78 y.o. 07/21/2012 7:11 PM   Principle Diagnosis: Encounter Diagnoses  Name Primary?  . Mantle cell lymphoma of intra-abdominal lymph nodes Yes  . Mantle cell lymphoma of intra-abdominal lymph nodes      Interim History:   Short interim followup visit for this pleasant 78 year old man with clinical stage III mantle cell lymphoma presenting with pancytopenia and splenomegaly, progressive weakness and weight loss, in April of this year. He has multiple comorbid medical conditions and I must admit I was quite apprehensive about treating him now. I started him on a program of single agent Bendamustine 70 mg per meter squared day 1-2 every 28 days. His performance status has slowly but surely improved and today he is asymptomatic. Recent restaging CT scans following 3 cycles of treatment show a significant partial response. He had a single brief hospitalization for pneumonitis back in July but otherwise has done very well with the chemotherapy treatments. He just had his fourth cycle on August 7 and eighth. Initial significant dyspnea and peripheral edema has now resolved.  Medications: reviewed  Allergies:  Allergies  Allergen Reactions  . Azithromycin Itching  . Macrodantin Itching  . Minocycline Hcl Itching  . Nitrofurantoin Itching  . Zithromax (Azithromycin Dihydrate) Itching  . Contrast Media (Iodinated Diagnostic Agents) Rash    Review of Systems: Constitutional:   Significantly improved Respiratory: No dyspnea at rest Cardiovascular:  No chest pain or palpitations Gastrointestinal: No abdominal pain or change in bowel habit Genito-Urinary: No urinary tract symptoms except nocturia Musculoskeletal: Currently no muscle or bone pain Neurologic: No headache or change in vision Skin: No rash or ecchymosis Remaining ROS negative.  Physical Exam: Blood pressure 142/66, pulse 63, temperature 97 F  (36.1 C), temperature source Oral, resp. rate 20, height 6\' 1"  (1.854 m), weight 183 lb 4.8 oz (83.144 kg). Wt Readings from Last 3 Encounters:  07/21/12 183 lb 4.8 oz (83.144 kg)  06/26/12 176 lb 9.6 oz (80.105 kg)  06/06/12 179 lb 6 oz (81.364 kg)     General appearance: Tall, thin, adequately nourished Caucasian man HENNT: Pharynx no erythema exudate or mass Lymph nodes: No adenopathy Breasts: Lungs: Clear to auscultation resonant to percussion Heart: Regular rhythm 2/6 systolic murmur at the apex transmitted to the left sternal border and the second right intercostal space Abdomen: Soft nontender. Clinical splenomegaly has resolved. Extremities: Peripheral edema now resolved Vascular: No cyanosis Neurologic: Somewhat hard of hearing. Cranial nerves otherwise grossly normal. Motor strength is 5 over 5. Reflexes 1+ symmetric. Skin: No rash or ecchymosis  Lab Results: Lab Results  Component Value Date   WBC 3.2* 07/21/2012   HGB 10.3* 07/21/2012   HCT 30.5* 07/21/2012   MCV 99.2* 07/21/2012   PLT 74* 07/21/2012     Chemistry      Component Value Date/Time   NA 141 07/10/2012 0933   K 3.9 07/10/2012 0933   CL 108 07/10/2012 0933   CO2 26 07/10/2012 0933   BUN 18 07/10/2012 0933   CREATININE 0.85 07/10/2012 0933      Component Value Date/Time   CALCIUM 8.9 07/10/2012 0933   ALKPHOS 108 07/10/2012 0933   AST 19 07/10/2012 0933   ALT 17 07/10/2012 0933   BILITOT 0.4 07/10/2012 0933         Impression and Plan: #1. Mantle cell lymphoma clinical stage III(not staging bone marrow biopsy done at patient's request).  He is responding nicely to single  agent Treanda.  I again discussed adding Rituxan to the chemotherapy since his performance status has improved. He is finally feeling good and doesn't want to rock the boat. I think it is safe to just reserve the Rituxan for a later date. I will continue with an additional 2 cycles of the Treanda. I will keep him at the current dose.  #2. Recent acute  pneumonitis resolved with treatment and short hospital stay  #3. Coronary artery disease with previous MI, previous bypass surgery, recent bare metal coronary stent.  #4. Likely obstructive airway disease with right heart failure due to prior smoking history.  #5. Noncalcified 8 mm left lower lobe pulmonary lesion. Observation alone at this time. Restaging CT scan in August.  #6. Thrombocytopenia secondary to splenomegaly from lymphoma improved on treatment  #7. Type 2 diabetes    CC:. Dr. Ivery Quale; Dr. Valera Castle   Levert Feinstein, MD 8/16/20137:11 PM

## 2012-07-22 LAB — SEDIMENTATION RATE: Sed Rate: 18 mm/hr — ABNORMAL HIGH (ref 0–16)

## 2012-07-25 ENCOUNTER — Telehealth: Payer: Self-pay

## 2012-07-25 ENCOUNTER — Telehealth: Payer: Self-pay | Admitting: Oncology

## 2012-07-25 NOTE — Telephone Encounter (Signed)
Talked to pt and gave him appt date, also talked to pt's wife and she is aware of appt on 08/08/12 advised pt to get calendar appt for September

## 2012-07-25 NOTE — Telephone Encounter (Signed)
Per staff message and POF I have scheduled appts. TMB 

## 2012-07-26 ENCOUNTER — Telehealth: Payer: Self-pay | Admitting: Oncology

## 2012-07-26 NOTE — Telephone Encounter (Signed)
Called pt and left message regarding chemo, lab and MD, September 2013

## 2012-08-03 ENCOUNTER — Encounter: Payer: Self-pay | Admitting: *Deleted

## 2012-08-07 ENCOUNTER — Ambulatory Visit: Payer: Medicare Other

## 2012-08-08 ENCOUNTER — Ambulatory Visit: Payer: Medicare Other

## 2012-08-08 ENCOUNTER — Other Ambulatory Visit (HOSPITAL_BASED_OUTPATIENT_CLINIC_OR_DEPARTMENT_OTHER): Payer: Medicare Other | Admitting: Lab

## 2012-08-08 ENCOUNTER — Other Ambulatory Visit: Payer: Self-pay | Admitting: *Deleted

## 2012-08-08 ENCOUNTER — Other Ambulatory Visit: Payer: Self-pay | Admitting: Oncology

## 2012-08-08 DIAGNOSIS — C8313 Mantle cell lymphoma, intra-abdominal lymph nodes: Secondary | ICD-10-CM

## 2012-08-08 DIAGNOSIS — C8590 Non-Hodgkin lymphoma, unspecified, unspecified site: Secondary | ICD-10-CM

## 2012-08-08 LAB — CBC WITH DIFFERENTIAL/PLATELET
Basophils Absolute: 0 10*3/uL (ref 0.0–0.1)
Eosinophils Absolute: 0.1 10*3/uL (ref 0.0–0.5)
HGB: 10.6 g/dL — ABNORMAL LOW (ref 13.0–17.1)
LYMPH%: 32.3 % (ref 14.0–49.0)
MCV: 97.2 fL (ref 79.3–98.0)
MONO%: 19.3 % — ABNORMAL HIGH (ref 0.0–14.0)
NEUT#: 1.3 10*3/uL — ABNORMAL LOW (ref 1.5–6.5)
Platelets: 81 10*3/uL — ABNORMAL LOW (ref 140–400)

## 2012-08-08 NOTE — Progress Notes (Signed)
Delay treatment 1 week for platelets 81 and ANC 1.3 per Dr. Cyndie Chime.

## 2012-08-09 ENCOUNTER — Telehealth: Payer: Self-pay | Admitting: *Deleted

## 2012-08-09 ENCOUNTER — Ambulatory Visit: Payer: Medicare Other

## 2012-08-09 NOTE — Telephone Encounter (Signed)
moved patient appointment for 08-16-2012 and 08-15-2012 sent michelle email to set up patient's appointment

## 2012-08-15 ENCOUNTER — Other Ambulatory Visit (HOSPITAL_BASED_OUTPATIENT_CLINIC_OR_DEPARTMENT_OTHER): Payer: Medicare Other

## 2012-08-15 ENCOUNTER — Ambulatory Visit (HOSPITAL_BASED_OUTPATIENT_CLINIC_OR_DEPARTMENT_OTHER): Payer: Medicare Other

## 2012-08-15 VITALS — BP 121/58 | HR 60 | Temp 97.1°F

## 2012-08-15 DIAGNOSIS — Z5111 Encounter for antineoplastic chemotherapy: Secondary | ICD-10-CM

## 2012-08-15 DIAGNOSIS — C8313 Mantle cell lymphoma, intra-abdominal lymph nodes: Secondary | ICD-10-CM

## 2012-08-15 DIAGNOSIS — C8590 Non-Hodgkin lymphoma, unspecified, unspecified site: Secondary | ICD-10-CM

## 2012-08-15 DIAGNOSIS — D696 Thrombocytopenia, unspecified: Secondary | ICD-10-CM

## 2012-08-15 LAB — CBC WITH DIFFERENTIAL/PLATELET
BASO%: 0.4 % (ref 0.0–2.0)
LYMPH%: 25.8 % (ref 14.0–49.0)
MCHC: 34.6 g/dL (ref 32.0–36.0)
MONO#: 0.7 10*3/uL (ref 0.1–0.9)
NEUT#: 2.2 10*3/uL (ref 1.5–6.5)
Platelets: 84 10*3/uL — ABNORMAL LOW (ref 140–400)
RBC: 3.24 10*6/uL — ABNORMAL LOW (ref 4.20–5.82)
RDW: 16.1 % — ABNORMAL HIGH (ref 11.0–14.6)
WBC: 4.1 10*3/uL (ref 4.0–10.3)
lymph#: 1.1 10*3/uL (ref 0.9–3.3)

## 2012-08-15 MED ORDER — ONDANSETRON 8 MG/50ML IVPB (CHCC)
8.0000 mg | Freq: Once | INTRAVENOUS | Status: AC
  Administered 2012-08-15: 8 mg via INTRAVENOUS

## 2012-08-15 MED ORDER — DIPHENHYDRAMINE HCL 25 MG PO CAPS
50.0000 mg | ORAL_CAPSULE | Freq: Once | ORAL | Status: AC
  Administered 2012-08-15: 50 mg via ORAL

## 2012-08-15 MED ORDER — DEXAMETHASONE SODIUM PHOSPHATE 10 MG/ML IJ SOLN
10.0000 mg | Freq: Once | INTRAMUSCULAR | Status: AC
  Administered 2012-08-15: 10 mg via INTRAVENOUS

## 2012-08-15 MED ORDER — SODIUM CHLORIDE 0.9 % IV SOLN
70.0000 mg/m2 | Freq: Once | INTRAVENOUS | Status: AC
  Administered 2012-08-15: 155 mg via INTRAVENOUS
  Filled 2012-08-15: qty 31

## 2012-08-15 MED ORDER — ACETAMINOPHEN 325 MG PO TABS
650.0000 mg | ORAL_TABLET | Freq: Once | ORAL | Status: AC
  Administered 2012-08-15: 650 mg via ORAL

## 2012-08-15 MED ORDER — SODIUM CHLORIDE 0.9 % IV SOLN
Freq: Once | INTRAVENOUS | Status: AC
  Administered 2012-08-15: 10:00:00 via INTRAVENOUS

## 2012-08-15 NOTE — Patient Instructions (Signed)
[facility] Discharge Instructions for Patients Receiving Chemotherapy  Today you received the following chemotherapy agents Treanda To help prevent nausea and vomiting after your treatment, we encourage you to take your nausea medication as prescribed.If you develop nausea and vomiting that is not controlled by your nausea medication, call the clinic. If it is after clinic hours your family physician or the after hours number for the clinic or go to the Emergency Department.   BELOW ARE SYMPTOMS THAT SHOULD BE REPORTED IMMEDIATELY:  *FEVER GREATER THAN 100.5 F  *CHILLS WITH OR WITHOUT FEVER  NAUSEA AND VOMITING THAT IS NOT CONTROLLED WITH YOUR NAUSEA MEDICATION  *UNUSUAL SHORTNESS OF BREATH  *UNUSUAL BRUISING OR BLEEDING  TENDERNESS IN MOUTH AND THROAT WITH OR WITHOUT PRESENCE OF ULCERS  *URINARY PROBLEMS  *BOWEL PROBLEMS  UNUSUAL RASH Items with * indicate a potential emergency and should be followed up as soon as possible.  One of the nurses will contact you 24 hours after your treatment. Please let the nurse know about any problems that you may have experienced. Feel free to call the clinic you have any questions or concerns. The clinic phone number is [phone].   I have been informed and understand all the instructions given to me. I know to contact the clinic, my physician, or go to the Emergency Department if any problems should occur. I do not have any questions at this time, but understand that I may call the clinic during office hours   should I have any questions or need assistance in obtaining follow up care.    __________________________________________  _____________  __________ Signature of Patient or Authorized Representative            Date                   Time    __________________________________________ Nurse's Signature    

## 2012-08-15 NOTE — Progress Notes (Signed)
Ok to treat with platelets 84 per Dr. Cyndie Chime.

## 2012-08-16 ENCOUNTER — Other Ambulatory Visit (HOSPITAL_BASED_OUTPATIENT_CLINIC_OR_DEPARTMENT_OTHER): Payer: Medicare Other | Admitting: Lab

## 2012-08-16 ENCOUNTER — Ambulatory Visit (HOSPITAL_BASED_OUTPATIENT_CLINIC_OR_DEPARTMENT_OTHER): Payer: Medicare Other

## 2012-08-16 VITALS — BP 108/56 | HR 56 | Temp 97.0°F | Resp 18

## 2012-08-16 DIAGNOSIS — Z5111 Encounter for antineoplastic chemotherapy: Secondary | ICD-10-CM

## 2012-08-16 DIAGNOSIS — C8313 Mantle cell lymphoma, intra-abdominal lymph nodes: Secondary | ICD-10-CM

## 2012-08-16 DIAGNOSIS — C8590 Non-Hodgkin lymphoma, unspecified, unspecified site: Secondary | ICD-10-CM

## 2012-08-16 DIAGNOSIS — D696 Thrombocytopenia, unspecified: Secondary | ICD-10-CM

## 2012-08-16 LAB — CBC WITH DIFFERENTIAL/PLATELET
Basophils Absolute: 0 10*3/uL (ref 0.0–0.1)
Eosinophils Absolute: 0.1 10*3/uL (ref 0.0–0.5)
HGB: 10.1 g/dL — ABNORMAL LOW (ref 13.0–17.1)
MCV: 97.4 fL (ref 79.3–98.0)
MONO#: 0.6 10*3/uL (ref 0.1–0.9)
MONO%: 15.8 % — ABNORMAL HIGH (ref 0.0–14.0)
NEUT#: 2.6 10*3/uL (ref 1.5–6.5)
RDW: 15.5 % — ABNORMAL HIGH (ref 11.0–14.6)
WBC: 3.9 10*3/uL — ABNORMAL LOW (ref 4.0–10.3)
nRBC: 0 % (ref 0–0)

## 2012-08-16 MED ORDER — SODIUM CHLORIDE 0.9 % IV SOLN
Freq: Once | INTRAVENOUS | Status: AC
  Administered 2012-08-16: 09:00:00 via INTRAVENOUS

## 2012-08-16 MED ORDER — DEXAMETHASONE SODIUM PHOSPHATE 10 MG/ML IJ SOLN
10.0000 mg | Freq: Once | INTRAMUSCULAR | Status: AC
  Administered 2012-08-16: 10 mg via INTRAVENOUS

## 2012-08-16 MED ORDER — ONDANSETRON 8 MG/50ML IVPB (CHCC)
8.0000 mg | Freq: Once | INTRAVENOUS | Status: AC
  Administered 2012-08-16: 8 mg via INTRAVENOUS

## 2012-08-16 MED ORDER — SODIUM CHLORIDE 0.9 % IV SOLN
70.0000 mg/m2 | Freq: Once | INTRAVENOUS | Status: AC
  Administered 2012-08-16: 155 mg via INTRAVENOUS
  Filled 2012-08-16: qty 31

## 2012-08-16 NOTE — Patient Instructions (Addendum)
[facility] Discharge Instructions for Patients Receiving Chemotherapy  Today you received the following chemotherapy agents  TREANDA To help prevent nausea and vomiting after your treatment, we encourage you to take your nausea medication AS DIRECTED  If you develop nausea and vomiting that is not controlled by your nausea medication, call the clinic. If it is after clinic hours your family physician or the after hours number for the clinic or go to the Emergency Department.   BELOW ARE SYMPTOMS THAT SHOULD BE REPORTED IMMEDIATELY:  *FEVER GREATER THAN 100.5 F  *CHILLS WITH OR WITHOUT FEVER  NAUSEA AND VOMITING THAT IS NOT CONTROLLED WITH YOUR NAUSEA MEDICATION  *UNUSUAL SHORTNESS OF BREATH  *UNUSUAL BRUISING OR BLEEDING  TENDERNESS IN MOUTH AND THROAT WITH OR WITHOUT PRESENCE OF ULCERS  *URINARY PROBLEMS  *BOWEL PROBLEMS  UNUSUAL RASH Items with * indicate a potential emergency and should be followed up as soon as possible.  One of the nurses will contact you 24 hours after your treatment. Please let the nurse know about any problems that you may have experienced. Feel free to call the clinic you have any questions or concerns. The clinic phone number is [phone].   I have been informed and understand all the instructions given to me. I know to contact the clinic, my physician, or go to the Emergency Department if any problems should occur. I do not have any questions at this time, but understand that I may call the clinic during office hours   should I have any questions or need assistance in obtaining follow up care.    __________________________________________  _____________  __________ Signature of Patient or Authorized Representative            Date                   Time    __________________________________________ Nurse's Signature

## 2012-08-21 ENCOUNTER — Telehealth: Payer: Self-pay | Admitting: Oncology

## 2012-08-21 ENCOUNTER — Telehealth: Payer: Self-pay | Admitting: *Deleted

## 2012-08-21 ENCOUNTER — Ambulatory Visit (HOSPITAL_BASED_OUTPATIENT_CLINIC_OR_DEPARTMENT_OTHER): Payer: Medicare Other | Admitting: Nurse Practitioner

## 2012-08-21 ENCOUNTER — Other Ambulatory Visit (HOSPITAL_BASED_OUTPATIENT_CLINIC_OR_DEPARTMENT_OTHER): Payer: Medicare Other

## 2012-08-21 VITALS — BP 115/57 | HR 63 | Temp 98.0°F | Resp 18 | Ht 73.0 in | Wt 187.2 lb

## 2012-08-21 DIAGNOSIS — E119 Type 2 diabetes mellitus without complications: Secondary | ICD-10-CM

## 2012-08-21 DIAGNOSIS — C8313 Mantle cell lymphoma, intra-abdominal lymph nodes: Secondary | ICD-10-CM

## 2012-08-21 DIAGNOSIS — I2510 Atherosclerotic heart disease of native coronary artery without angina pectoris: Secondary | ICD-10-CM

## 2012-08-21 LAB — CBC WITH DIFFERENTIAL/PLATELET
Basophils Absolute: 0 10*3/uL (ref 0.0–0.1)
Eosinophils Absolute: 0.1 10*3/uL (ref 0.0–0.5)
HCT: 31.3 % — ABNORMAL LOW (ref 38.4–49.9)
HGB: 11 g/dL — ABNORMAL LOW (ref 13.0–17.1)
LYMPH%: 7.2 % — ABNORMAL LOW (ref 14.0–49.0)
MCHC: 35.2 g/dL (ref 32.0–36.0)
MONO#: 0.5 10*3/uL (ref 0.1–0.9)
NEUT#: 3.2 10*3/uL (ref 1.5–6.5)
NEUT%: 78.4 % — ABNORMAL HIGH (ref 39.0–75.0)
Platelets: 94 10*3/uL — ABNORMAL LOW (ref 140–400)
WBC: 4 10*3/uL (ref 4.0–10.3)
lymph#: 0.3 10*3/uL — ABNORMAL LOW (ref 0.9–3.3)

## 2012-08-21 LAB — COMPREHENSIVE METABOLIC PANEL (CC13)
AST: 16 U/L (ref 5–34)
BUN: 18 mg/dL (ref 7.0–26.0)
Calcium: 9.3 mg/dL (ref 8.4–10.4)
Chloride: 104 mEq/L (ref 98–107)
Creatinine: 1 mg/dL (ref 0.7–1.3)
Glucose: 182 mg/dl — ABNORMAL HIGH (ref 70–99)

## 2012-08-21 LAB — LACTATE DEHYDROGENASE (CC13): LDH: 173 U/L (ref 125–220)

## 2012-08-21 MED ORDER — FUROSEMIDE 40 MG PO TABS: 40.0000 mg | ORAL_TABLET | Freq: Every day | ORAL | Status: DC

## 2012-08-21 NOTE — Telephone Encounter (Signed)
Made and printed schedule for pt. Will call pt will chemo times.

## 2012-08-21 NOTE — Progress Notes (Signed)
OFFICE PROGRESS NOTE  Interval history:  [name] is an 78 year old man diagnosed with clinical stage III mantle cell lymphoma in April of this year at which time he presented with pancytopenia and splenomegaly, progressive weakness and weight loss. He is on active treatment with single agent bendamustine 70 mg per meter squared for 2 consecutive days every 28 days. Restaging CT scans following 3 cycles showed a significant partial response. He completed cycle 5 on 08/15/2012 and 08/16/2012. He is seen today for scheduled followup.  [name] reports that he feels well. No fevers or sweats. He has a good appetite and overall good energy level. He denies pain. No nausea or vomiting. No mouth sores. No diarrhea. He denies any numbness or tingling in his hands or feet.   Objective: Blood pressure 115/57, pulse 63, temperature 98 F (36.7 C), temperature source Oral, resp. rate 18, height 6\' 1"  (1.854 m), weight 187 lb 3.2 oz (84.913 kg).  Oropharynx is without thrush or ulceration. No palpable cervical, supraclavicular or axillary lymph nodes. Lungs with faint scattered wheezes. The wheezes clear with coughing. Regular cardiac rhythm. Abdomen soft and nontender. No organomegaly. Trace lower leg edema bilaterally. Calves are soft and nontender. Motor strength 5 over 5. Knee DTRs 1+, symmetric. Hard of hearing.  Lab Results: Lab Results  Component Value Date   WBC 4.0 08/21/2012   HGB 11.0* 08/21/2012   HCT 31.3* 08/21/2012   MCV 99.4* 08/21/2012   PLT 94* 08/21/2012    Chemistry:    Chemistry      Component Value Date/Time   NA 138 08/21/2012 1308   NA 137 07/21/2012 1542   K 3.3* 08/21/2012 1308   K 3.6 07/21/2012 1542   CL 104 08/21/2012 1308   CL 99 07/21/2012 1542   CO2 25 08/21/2012 1308   CO2 30 07/21/2012 1542   BUN 18.0 08/21/2012 1308   BUN 17 07/21/2012 1542   CREATININE 1.0 08/21/2012 1308   CREATININE 0.92 07/21/2012 1542      Component Value Date/Time   CALCIUM 9.3 08/21/2012  1308   CALCIUM 9.2 07/21/2012 1542   ALKPHOS 117 08/21/2012 1308   ALKPHOS 143* 07/21/2012 1542   AST 16 08/21/2012 1308   AST 26 07/21/2012 1542   ALT 12 08/21/2012 1308   ALT 18 07/21/2012 1542   BILITOT 0.60 08/21/2012 1308   BILITOT 0.5 07/21/2012 1542       Studies/Results: No results found.  Medications: I have reviewed the patient's current medications.  Assessment/Plan  1. Mantle cell lymphoma, clinical stage III, currently on active treatment with single agent bendamustine. Restaging CT evaluation after 3 cycles showed a significant partial response. He completed cycle 5 beginning 08/15/2012. 2. Recent acute pneumonitis resolved with treatment and short hospital stay. 3. Coronary artery disease with previous MI, previous bypass surgery, recent bare-metal coronary stent. 4. Likely obstructive airway disease with right heart failure due to prior smoking history. 5. Thrombocytopenia secondary to splenomegaly from lymphoma. Improved on treatment. 6. Type 2 diabetes.  Disposition-[name] appears stable. He has completed 5 of a planned 6 cycles of bendamustine. He will return for cycle 6 on 09/11/2012. He will have restaging CT scans in November with a followup visit approximately one week later to review the results. He will contact the office prior to his next visit with any problems.  Plan reviewed with Dr. Cyndie Chime.   Lonna Cobb ANP/GNP-BC

## 2012-08-21 NOTE — Telephone Encounter (Signed)
Per staff message and POF I have scheduled appts.  JMW  

## 2012-08-22 ENCOUNTER — Telehealth: Payer: Self-pay | Admitting: *Deleted

## 2012-08-22 NOTE — Telephone Encounter (Signed)
Patient's wife called and I gave her the appts for this month.  JMW

## 2012-08-23 ENCOUNTER — Other Ambulatory Visit: Payer: Self-pay | Admitting: Dermatology

## 2012-08-28 ENCOUNTER — Telehealth: Payer: Self-pay | Admitting: Oncology

## 2012-08-28 NOTE — Telephone Encounter (Signed)
called pt with appt info   aom

## 2012-09-04 ENCOUNTER — Ambulatory Visit: Payer: Medicare Other

## 2012-09-05 ENCOUNTER — Ambulatory Visit: Payer: Medicare Other

## 2012-09-06 ENCOUNTER — Other Ambulatory Visit: Payer: Self-pay | Admitting: Dermatology

## 2012-09-06 ENCOUNTER — Other Ambulatory Visit (HOSPITAL_COMMUNITY): Payer: Self-pay | Admitting: Oncology

## 2012-09-07 ENCOUNTER — Observation Stay (HOSPITAL_COMMUNITY)
Admission: EM | Admit: 2012-09-07 | Discharge: 2012-09-09 | Disposition: A | Discharge status: Home / Self Care | Payer: Medicare Other | Attending: Internal Medicine | Admitting: Internal Medicine

## 2012-09-07 ENCOUNTER — Emergency Department (HOSPITAL_COMMUNITY): Payer: Medicare Other

## 2012-09-07 ENCOUNTER — Encounter (HOSPITAL_COMMUNITY): Payer: Self-pay | Admitting: Family Medicine

## 2012-09-07 DIAGNOSIS — I10 Essential (primary) hypertension: Secondary | ICD-10-CM

## 2012-09-07 DIAGNOSIS — D696 Thrombocytopenia, unspecified: Secondary | ICD-10-CM | POA: Insufficient documentation

## 2012-09-07 DIAGNOSIS — L309 Dermatitis, unspecified: Secondary | ICD-10-CM

## 2012-09-07 DIAGNOSIS — D649 Anemia, unspecified: Secondary | ICD-10-CM

## 2012-09-07 DIAGNOSIS — C4492 Squamous cell carcinoma of skin, unspecified: Secondary | ICD-10-CM | POA: Insufficient documentation

## 2012-09-07 DIAGNOSIS — E785 Hyperlipidemia, unspecified: Secondary | ICD-10-CM

## 2012-09-07 DIAGNOSIS — I5033 Acute on chronic diastolic (congestive) heart failure: Secondary | ICD-10-CM | POA: Insufficient documentation

## 2012-09-07 DIAGNOSIS — C8590 Non-Hodgkin lymphoma, unspecified, unspecified site: Secondary | ICD-10-CM

## 2012-09-07 DIAGNOSIS — R609 Edema, unspecified: Secondary | ICD-10-CM

## 2012-09-07 DIAGNOSIS — C8313 Mantle cell lymphoma, intra-abdominal lymph nodes: Secondary | ICD-10-CM

## 2012-09-07 DIAGNOSIS — R0989 Other specified symptoms and signs involving the circulatory and respiratory systems: Secondary | ICD-10-CM

## 2012-09-07 DIAGNOSIS — R600 Localized edema: Secondary | ICD-10-CM

## 2012-09-07 DIAGNOSIS — I509 Heart failure, unspecified: Secondary | ICD-10-CM | POA: Insufficient documentation

## 2012-09-07 DIAGNOSIS — L02619 Cutaneous abscess of unspecified foot: Principal | ICD-10-CM | POA: Insufficient documentation

## 2012-09-07 DIAGNOSIS — C8589 Other specified types of non-Hodgkin lymphoma, extranodal and solid organ sites: Secondary | ICD-10-CM

## 2012-09-07 DIAGNOSIS — A419 Sepsis, unspecified organism: Secondary | ICD-10-CM

## 2012-09-07 DIAGNOSIS — R079 Chest pain, unspecified: Secondary | ICD-10-CM

## 2012-09-07 DIAGNOSIS — M792 Neuralgia and neuritis, unspecified: Secondary | ICD-10-CM

## 2012-09-07 DIAGNOSIS — L03119 Cellulitis of unspecified part of limb: Secondary | ICD-10-CM

## 2012-09-07 DIAGNOSIS — M25473 Effusion, unspecified ankle: Secondary | ICD-10-CM | POA: Insufficient documentation

## 2012-09-07 DIAGNOSIS — N400 Enlarged prostate without lower urinary tract symptoms: Secondary | ICD-10-CM

## 2012-09-07 DIAGNOSIS — I679 Cerebrovascular disease, unspecified: Secondary | ICD-10-CM

## 2012-09-07 DIAGNOSIS — S91309A Unspecified open wound, unspecified foot, initial encounter: Secondary | ICD-10-CM | POA: Insufficient documentation

## 2012-09-07 DIAGNOSIS — I2510 Atherosclerotic heart disease of native coronary artery without angina pectoris: Secondary | ICD-10-CM | POA: Insufficient documentation

## 2012-09-07 DIAGNOSIS — E119 Type 2 diabetes mellitus without complications: Secondary | ICD-10-CM

## 2012-09-07 DIAGNOSIS — Z9289 Personal history of other medical treatment: Secondary | ICD-10-CM

## 2012-09-07 DIAGNOSIS — J189 Pneumonia, unspecified organism: Secondary | ICD-10-CM

## 2012-09-07 DIAGNOSIS — C4491 Basal cell carcinoma of skin, unspecified: Secondary | ICD-10-CM | POA: Insufficient documentation

## 2012-09-07 DIAGNOSIS — C8319 Mantle cell lymphoma, extranodal and solid organ sites: Secondary | ICD-10-CM | POA: Insufficient documentation

## 2012-09-07 DIAGNOSIS — E876 Hypokalemia: Secondary | ICD-10-CM | POA: Insufficient documentation

## 2012-09-07 DIAGNOSIS — Z951 Presence of aortocoronary bypass graft: Secondary | ICD-10-CM | POA: Insufficient documentation

## 2012-09-07 DIAGNOSIS — I350 Nonrheumatic aortic (valve) stenosis: Secondary | ICD-10-CM

## 2012-09-07 DIAGNOSIS — L97509 Non-pressure chronic ulcer of other part of unspecified foot with unspecified severity: Secondary | ICD-10-CM | POA: Insufficient documentation

## 2012-09-07 DIAGNOSIS — R161 Splenomegaly, not elsewhere classified: Secondary | ICD-10-CM | POA: Insufficient documentation

## 2012-09-07 DIAGNOSIS — D539 Nutritional anemia, unspecified: Secondary | ICD-10-CM

## 2012-09-07 DIAGNOSIS — X58XXXA Exposure to other specified factors, initial encounter: Secondary | ICD-10-CM | POA: Insufficient documentation

## 2012-09-07 DIAGNOSIS — Z79899 Other long term (current) drug therapy: Secondary | ICD-10-CM | POA: Insufficient documentation

## 2012-09-07 DIAGNOSIS — M25476 Effusion, unspecified foot: Secondary | ICD-10-CM | POA: Insufficient documentation

## 2012-09-07 LAB — URINALYSIS, ROUTINE W REFLEX MICROSCOPIC
Glucose, UA: NEGATIVE mg/dL
Hgb urine dipstick: NEGATIVE
Protein, ur: NEGATIVE mg/dL
Specific Gravity, Urine: 1.014 (ref 1.005–1.030)
pH: 6 (ref 5.0–8.0)

## 2012-09-07 LAB — CBC WITH DIFFERENTIAL/PLATELET
Basophils Absolute: 0 10*3/uL (ref 0.0–0.1)
Lymphocytes Relative: 23 % (ref 12–46)
Lymphs Abs: 1.7 10*3/uL (ref 0.7–4.0)
MCV: 98.8 fL (ref 78.0–100.0)
Monocytes Relative: 13 % — ABNORMAL HIGH (ref 3–12)
Platelets: 73 10*3/uL — ABNORMAL LOW (ref 150–400)
RDW: 15.5 % (ref 11.5–15.5)
WBC: 7.4 10*3/uL (ref 4.0–10.5)

## 2012-09-07 LAB — COMPREHENSIVE METABOLIC PANEL
AST: 17 U/L (ref 0–37)
Albumin: 3.4 g/dL — ABNORMAL LOW (ref 3.5–5.2)
Chloride: 94 mEq/L — ABNORMAL LOW (ref 96–112)
Creatinine, Ser: 1.04 mg/dL (ref 0.50–1.35)
Total Bilirubin: 0.7 mg/dL (ref 0.3–1.2)
Total Protein: 5.8 g/dL — ABNORMAL LOW (ref 6.0–8.3)

## 2012-09-07 LAB — LACTIC ACID, PLASMA: Lactic Acid, Venous: 1.1 mmol/L (ref 0.5–2.2)

## 2012-09-07 MED ORDER — PIPERACILLIN-TAZOBACTAM 3.375 G IVPB
3.3750 g | Freq: Once | INTRAVENOUS | Status: AC
  Administered 2012-09-07: 3.375 g via INTRAVENOUS
  Filled 2012-09-07 (×2): qty 50

## 2012-09-07 MED ORDER — ISOSORBIDE MONONITRATE ER 30 MG PO TB24
30.0000 mg | ORAL_TABLET | Freq: Every day | ORAL | Status: DC
  Administered 2012-09-08 – 2012-09-09 (×2): 30 mg via ORAL
  Filled 2012-09-07 (×2): qty 1

## 2012-09-07 MED ORDER — ASPIRIN EC 81 MG PO TBEC
81.0000 mg | DELAYED_RELEASE_TABLET | Freq: Every day | ORAL | Status: DC
  Administered 2012-09-08 – 2012-09-09 (×2): 81 mg via ORAL
  Filled 2012-09-07 (×2): qty 1

## 2012-09-07 MED ORDER — ONDANSETRON HCL 4 MG PO TABS: 4.0000 mg | ORAL_TABLET | Freq: Four times a day (QID) | ORAL | Status: DC | PRN

## 2012-09-07 MED ORDER — CYANOCOBALAMIN 500 MCG PO TABS
500.0000 ug | ORAL_TABLET | Freq: Every day | ORAL | Status: DC
  Administered 2012-09-08 – 2012-09-09 (×2): 500 ug via ORAL
  Filled 2012-09-07 (×2): qty 1

## 2012-09-07 MED ORDER — SIMVASTATIN 20 MG PO TABS
20.0000 mg | ORAL_TABLET | Freq: Every day | ORAL | Status: DC
  Administered 2012-09-08: 20 mg via ORAL
  Filled 2012-09-07 (×2): qty 1

## 2012-09-07 MED ORDER — ALBUTEROL SULFATE HFA 108 (90 BASE) MCG/ACT IN AERS: 2.0000 | INHALATION_SPRAY | Freq: Four times a day (QID) | RESPIRATORY_TRACT | Status: DC | PRN

## 2012-09-07 MED ORDER — PIPERACILLIN-TAZOBACTAM 3.375 G IVPB 30 MIN
3.3750 g | Freq: Three times a day (TID) | INTRAVENOUS | Status: DC
  Administered 2012-09-08 – 2012-09-09 (×4): 3.375 g via INTRAVENOUS
  Filled 2012-09-07 (×8): qty 50

## 2012-09-07 MED ORDER — VANCOMYCIN HCL IN DEXTROSE 1-5 GM/200ML-% IV SOLN
1000.0000 mg | Freq: Once | INTRAVENOUS | Status: AC
  Administered 2012-09-07: 1000 mg via INTRAVENOUS
  Filled 2012-09-07 (×2): qty 200

## 2012-09-07 MED ORDER — GABAPENTIN 300 MG PO CAPS
300.0000 mg | ORAL_CAPSULE | Freq: Every day | ORAL | Status: DC
  Administered 2012-09-08 (×2): 300 mg via ORAL
  Filled 2012-09-07 (×3): qty 1

## 2012-09-07 MED ORDER — ACETAMINOPHEN 650 MG RE SUPP
RECTAL | Status: AC
  Administered 2012-09-07: 650 mg
  Filled 2012-09-07: qty 1

## 2012-09-07 MED ORDER — ACETAMINOPHEN 325 MG PO TABS: 650.0000 mg | ORAL_TABLET | Freq: Four times a day (QID) | ORAL | Status: DC | PRN

## 2012-09-07 MED ORDER — GABAPENTIN 300 MG PO CAPS
600.0000 mg | ORAL_CAPSULE | Freq: Two times a day (BID) | ORAL | Status: DC
  Administered 2012-09-08 – 2012-09-09 (×3): 600 mg via ORAL
  Filled 2012-09-07 (×6): qty 2

## 2012-09-07 MED ORDER — DOCUSATE SODIUM 100 MG PO CAPS
100.0000 mg | ORAL_CAPSULE | Freq: Two times a day (BID) | ORAL | Status: DC
  Administered 2012-09-08 – 2012-09-09 (×4): 100 mg via ORAL
  Filled 2012-09-07 (×5): qty 1

## 2012-09-07 MED ORDER — NITROGLYCERIN 0.4 MG SL SUBL: 0.4000 mg | SUBLINGUAL_TABLET | SUBLINGUAL | Status: DC | PRN

## 2012-09-07 MED ORDER — SODIUM CHLORIDE 0.9 % IV SOLN
INTRAVENOUS | Status: DC
  Administered 2012-09-08: via INTRAVENOUS

## 2012-09-07 MED ORDER — OXYCODONE HCL 5 MG PO TABS
5.0000 mg | ORAL_TABLET | ORAL | Status: DC | PRN
  Administered 2012-09-08: 5 mg via ORAL
  Filled 2012-09-07: qty 1

## 2012-09-07 MED ORDER — SODIUM CHLORIDE 0.9 % IV BOLUS (SEPSIS)
500.0000 mL | Freq: Once | INTRAVENOUS | Status: AC
  Administered 2012-09-07: 500 mL via INTRAVENOUS

## 2012-09-07 MED ORDER — METOPROLOL SUCCINATE ER 25 MG PO TB24
25.0000 mg | ORAL_TABLET | Freq: Every day | ORAL | Status: DC
  Administered 2012-09-08 – 2012-09-09 (×2): 25 mg via ORAL
  Filled 2012-09-07 (×2): qty 1

## 2012-09-07 MED ORDER — SODIUM CHLORIDE 0.9 % IJ SOLN
3.0000 mL | Freq: Two times a day (BID) | INTRAMUSCULAR | Status: DC
  Administered 2012-09-09: 3 mL via INTRAVENOUS

## 2012-09-07 MED ORDER — ACETAMINOPHEN 650 MG RE SUPP: 650.0000 mg | Freq: Four times a day (QID) | RECTAL | Status: DC | PRN

## 2012-09-07 MED ORDER — ACYCLOVIR 400 MG PO TABS
400.0000 mg | ORAL_TABLET | Freq: Two times a day (BID) | ORAL | Status: DC
  Administered 2012-09-08 – 2012-09-09 (×4): 400 mg via ORAL
  Filled 2012-09-07 (×5): qty 1

## 2012-09-07 MED ORDER — ONDANSETRON HCL 4 MG/2ML IJ SOLN: 4.0000 mg | Freq: Four times a day (QID) | INTRAMUSCULAR | Status: DC | PRN

## 2012-09-07 NOTE — ED Notes (Signed)
Pt being treated for lymphoma; last treatment was 9/10 and 9/11.  Reports getting skin spot on neck removed yesterday afternoon. Reports fever started today and was 103.2 at home.

## 2012-09-07 NOTE — ED Notes (Signed)
Only able to obtain one set of blood cultures.

## 2012-09-07 NOTE — ED Provider Notes (Signed)
History     CSN: [id number]  Arrival date & time 09/07/12  5621   First MD Initiated Contact with Patient 09/07/12 1918      Chief Complaint  Patient presents with  . Fever    (Consider location/radiation/quality/duration/timing/severity/associated sxs/prior treatment) HPI Pt with lymphoma and currently being treated with chemo presents with fever to 103.2 today. Pt states he has had chills and fatigue today. Had SCC removed from neck yesterday without complication. Pt is currently taking doxycycline for L foot ulcer. He has had increased pain in the area when walking. No HA, neck stiffness, URI symptoms, cough, CP, abd pain, N/V/D, urinary symptoms  Past Medical History  Diagnosis Date  . CAD (coronary artery disease)     a. s/p CABG 1995;  b. NSTEMI & subsequent BMS to SVG->right PDA 02/03/12.;  c. Cath 02/14/2012 3vd with 4/4 patent grafts and patent stent in vg->pda.  . Cerebrovascular disease, unspecified   . Unspecified essential hypertension   . Other and unspecified hyperlipidemia   . Type II or unspecified type diabetes mellitus without mention of complication, not stated as uncontrolled   . Osteoarthritis (arthritis due to wear and tear of joints)   . Asthmatic bronchitis   . BPH (benign prostatic hypertrophy)   . Herpes zoster   . Arthritis   . Anemia   . Thrombocytopenia   . Aortic stenosis, moderate     a. Echo 02/14/12 EF 55%, Gr 2 DD, Mod AS (mean grad: 29, peak grad 52)  . Splenomegaly 04/13/2012  . History of blood transfusion 04/13/2012  . 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 Dermatitis 06/07/2012    acnieform rash on back 06/06/12    Past Surgical History  Procedure Date  . Coronary artery bypass graft 1995  . Rotator cuff repair   . Carpal tunnel release   . Incisional hernia repair   . Kidney surgery   . Back surgery     Family History  Problem Relation Age of Onset  . Coronary artery disease    . Alcohol abuse      History   Substance Use Topics  . Smoking status: Former Smoker -- 1.0 packs/day for 30 years    Types: Cigarettes, Pipe    Quit date: 03/29/1985  . Smokeless tobacco: Never Used   Comment: quit in 1986  . Alcohol Use: No      Review of Systems  Constitutional: Positive for fever, chills and fatigue.  HENT: Positive for sore throat. Negative for congestion, rhinorrhea, neck pain and neck stiffness.   Eyes: Negative for pain.  Respiratory: Negative for cough and shortness of breath.   Cardiovascular: Negative for chest pain, palpitations and leg swelling.  Gastrointestinal: Negative for nausea, vomiting, abdominal pain and constipation.  Genitourinary: Negative for dysuria, hematuria and flank pain.  Musculoskeletal: Positive for myalgias. Negative for back pain.  Skin: Positive for color change and wound.  Neurological: Negative for dizziness, weakness, light-headedness, numbness and headaches.    Allergies  Azithromycin; Macrodantin; Minocycline hcl; Nitrofurantoin; Zithromax; and Contrast media  Home Medications   Current Outpatient Rx  Name Route Sig Dispense Refill  . ACYCLOVIR 400 MG PO TABS Oral Take 400 mg by mouth 2 (two) times daily.    . ALBUTEROL SULFATE HFA 108 (90 BASE) MCG/ACT IN AERS Inhalation Inhale 2 puffs into the lungs every 6 (six) hours as needed. WHEEZING AND SHORTNESS OF BREATH    . ASPIRIN EC 81 MG PO TBEC Oral  Take 81 mg by mouth every morning.     . FUROSEMIDE 40 MG PO TABS Oral Take 1 tablet (40 mg total) by mouth daily. Fluid 90 tablet 0  . GABAPENTIN 300 MG PO CAPS Oral Take 300-600 mg by mouth 3 (three) times daily. TAKES 2 CAPSULES IN THE MORNING AND 2 AT LUNCH AND 1 AT NIGHT    . HYDROCODONE-HOMATROPINE 5-1.5 MG/5ML PO SYRP Oral Take 5 mLs by mouth every 6 (six) hours as needed. COUGH    . ISOSORBIDE MONONITRATE ER 30 MG PO TB24 Oral Take 1 tablet (30 mg total) by mouth daily. 30 tablet 11  . LOSARTAN POTASSIUM 100 MG PO TABS Oral Take 1 tablet by mouth  daily.     . MELOXICAM 15 MG PO TABS Oral Take 15 mg by mouth daily.    Marland Kitchen METOPROLOL SUCCINATE ER 25 MG PO TB24 Oral Take 25 mg by mouth every morning.     Marland Kitchen NITROGLYCERIN 0.4 MG SL SUBL Sublingual Place 0.4 mg under the tongue every 5 (five) minutes as needed. For chest pain.    Marland Kitchen ONDANSETRON 8 MG PO TBDP Sublingual Place 8 mg under the tongue every 8 (eight) hours as needed. For nausea.    Marland Kitchen PRAVASTATIN SODIUM 40 MG PO TABS Oral Take 40 mg by mouth daily.    Marland Kitchen VITAMIN B-12 500 MCG PO TABS Oral Take 500 mcg by mouth daily.      BP 108/50  Pulse 79  Temp 99.7 F (37.6 C) (Oral)  Resp 23  SpO2 93%  Physical Exam  Nursing note and vitals reviewed. Constitutional: He is oriented to person, place, and time. He appears well-developed and well-nourished. No distress.  HENT:  Head: Normocephalic and atraumatic.  Mouth/Throat: Oropharynx is clear and moist. No oropharyngeal exudate.  Eyes: EOM are normal. Pupils are equal, round, and reactive to light.  Neck: Normal range of motion. Neck supple.       No nuchal rigidity. Surgical wound on L posterior neck with intact sutures and no evidence of swelling, redness or drainage  Cardiovascular: Normal rate and regular rhythm.   Murmur heard. Pulmonary/Chest: Effort normal and breath sounds normal. No respiratory distress. He has no wheezes. He has no rales. He exhibits no tenderness.  Abdominal: Soft. Bowel sounds are normal. He exhibits no distension and no mass. There is no tenderness. There is no rebound and no guarding.  Musculoskeletal: Normal range of motion. He exhibits no edema and no tenderness.  Neurological: He is alert and oriented to person, place, and time.       5/5 motor in all ext, sensation intact  Skin: Skin is warm and dry. No rash noted. There is erythema.       Ulceration to bottom of L foot with surrounding erythema tracking up to L ankle. No L inguinal lymphadenopathy  Psychiatric: He has a normal mood and affect. His  behavior is normal.    ED Course  Procedures (including critical care time)  Labs Reviewed  CBC WITH DIFFERENTIAL - Abnormal; Notable for the following:    RBC 3.30 (*)     Hemoglobin 11.0 (*)     HCT 32.6 (*)     Platelets 73 (*)     Monocytes Relative 13 (*)     All other components within normal limits  COMPREHENSIVE METABOLIC PANEL - Abnormal; Notable for the following:    Sodium 130 (*)     Chloride 94 (*)     Glucose,  Bld 199 (*)     Total Protein 5.8 (*)     Albumin 3.4 (*)     Alkaline Phosphatase 122 (*)     GFR calc non Af Amer 64 (*)     GFR calc Af Amer 75 (*)     All other components within normal limits  URINALYSIS, ROUTINE W REFLEX MICROSCOPIC  LACTIC ACID, PLASMA  URINE CULTURE  CULTURE, BLOOD (ROUTINE X 2)  BASIC METABOLIC PANEL  CBC  PROTIME-INR  APTT  FIBRINOGEN   Dg Chest 2 View  09/07/2012  *RADIOLOGY REPORT*  Clinical Data: Fever  CHEST - 2 VIEW  Comparison: 06/06/2012  Findings: There are changes from previous cardiac surgery.  The cardiac silhouette is mildly enlarged.  No mediastinal or hilar masses or adenopathy noted.  There are prominent bronchovascular markings, stable.  The lungs are otherwise clear.  No pleural effusion or pneumothorax.  IMPRESSION: No acute cardiopulmonary disease.  No change from the prior study.   Original Report Authenticated By: Domenic Moras, M.D.    Dg Foot Complete Left  09/07/2012  *RADIOLOGY REPORT*  Clinical Data: Plantar wound.  Fever  LEFT FOOT - COMPLETE 3+ VIEW  Comparison: None.  Findings: Negative for fracture.  Negative for osteomyelitis.  No bony erosion.  Arterial calcification is present. Calcaneal spurring.  IMPRESSION: No acute bony abnormality.   Original Report Authenticated By: Camelia Phenes, M.D.      1. Mantle cell lymphoma of intra-abdominal lymph nodes   2. Peripheral edema   3. Cellulitis of foot   4. Coronary artery disease   5. Lymphoma   6. Sepsis       MDM   Discussed with  oncology, Dr Clelia Croft. Admit to medicine. Will see in hospital tomorrow.        Loren Racer, MD 09/07/12 [phone]

## 2012-09-07 NOTE — H&P (Signed)
[facility] History and Physical  [name] ZOX:[id number] DOB: [birth year] DOA: 09/07/2012   PCP: Garlan Fillers, MD   Chief Complaint: fever,chills, rigors  HPI:  78 year old male presents with a one-day history of fevers, chills, rigors. The patient went to see his primary care provider, Dr. Brunilda Payor, about 3 weeks ago for a blister on his left foot. His primary care provider "scraped" the blister and placed the patient on doxycycline. Apparently the patient has some difficulty tolerating the doxycycline which caused him to have itching and a rash on his chest. Nevertheless, the patient finished 8 days of the therapy. He had a follow up appointment with his physician, but did not keep the appointment because his foot was improving according to his wife who had been caring for his wound. However, he and his wife noted increasing redness on the surface of the foot and swelling that started this morning. This was associated with fevers, chills, rigors. He took his temperature at home, and it was noted to be 103.82F. He denies any pain in his foot. He denies any chest pain, shortness of breath, coughing, headache, dizziness, nausea, vomiting, diarrhea, abdominal pain, sore throat, dysuria, hematuria. Because of his fevers and chills, the patient came to the emergency department. Blood cultures and urine cultures were drawn, and the patient was started on empiric vancomycin and Zosyn. X-rays of the foot did not reveal any fractures or soft tissue gas. Chest x-ray did reveal chronic infiltrates with subtle increase in the right lower lobe. In emergency department, the patient was noted to have a temperature of 103.43F. Assessment/Plan: Sepsis -The source of the infection appears to be cellulitis/foot infection -Certainly, given his open wound the patient may very well have bacteremia -Urinalysis does not suggest UTI -Blood cultures have been obtained, continue empiric vancomycin and  Zosyn -MRI left foot -Consult wound care -Start intravenous fluids Thrombocytopenia -This appears to be chronic due to the patient's splenomegaly from his lymphoma -Check PT/PTT, fibrinogen -No signs of active bleeding at this time -SCDs for DVT prophylaxis due to thrombocytopenia Mantle cell lymphoma, stage III -Patient has finished 5 of 6 cycles of bendamustine -His next cycle was due on 09/11/2012 -Dr. Clelia Croft was consulted in the ED; he will see the patient in the am Squamous cell carcinoma and basal cell carcinoma of the skin -Incision on the left posterior neck does not appear to be infected -Patient sees dermatology who recently performed a biopsy Coronary artery disease status post bare metal stent April 2013 -Continue aspirin, Toprol, Imdur -Hold losartan for now given the patient's relatively low blood pressure       Past Medical History  Diagnosis Date  . CAD (coronary artery disease)     a. s/p CABG 1995;  b. NSTEMI & subsequent BMS to SVG->right PDA 02/03/12.;  c. Cath 02/14/2012 3vd with 4/4 patent grafts and patent stent in vg->pda.  . Cerebrovascular disease, unspecified   . Unspecified essential hypertension   . Other and unspecified hyperlipidemia   . Type II or unspecified type diabetes mellitus without mention of complication, not stated as uncontrolled   . Osteoarthritis (arthritis due to wear and tear of joints)   . Asthmatic bronchitis   . BPH (benign prostatic hypertrophy)   . Herpes zoster   . Arthritis   . Anemia   . Thrombocytopenia   . Aortic stenosis, moderate     a. Echo 02/14/12 EF 55%, Gr 2 DD, Mod AS (mean grad: 29, peak grad 52)  .  Splenomegaly 04/13/2012  . History of blood transfusion 04/13/2012  . 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 Dermatitis 06/07/2012    acnieform rash on back 06/06/12   Past Surgical History  Procedure Date  . Coronary artery bypass graft 1995  . Rotator cuff repair   . Carpal tunnel release    . Incisional hernia repair   . Kidney surgery   . Back surgery    Social History:  reports that he quit smoking about 27 years ago. His smoking use included Cigarettes and Pipe. He has a 30 pack-year smoking history. He has never used smokeless tobacco. He reports that he does not drink alcohol or use illicit drugs.  Allergies  Allergen Reactions  . Azithromycin Itching  . Macrodantin Itching  . Minocycline Hcl Itching  . Nitrofurantoin Itching  . Zithromax (Azithromycin Dihydrate) Itching  . Contrast Media (Iodinated Diagnostic Agents) Rash    Family History  Problem Relation Age of Onset  . Coronary artery disease    . Alcohol abuse      Prior to Admission medications   Medication Sig Start Date End Date Taking? Authorizing Provider  acyclovir (ZOVIRAX) 400 MG tablet Take 400 mg by mouth 2 (two) times daily.   Yes Historical Provider, MD  albuterol (PROVENTIL HFA;VENTOLIN HFA) 108 (90 BASE) MCG/ACT inhaler Inhale 2 puffs into the lungs every 6 (six) hours as needed. WHEEZING AND SHORTNESS OF BREATH   Yes Historical Provider, MD  aspirin EC 81 MG tablet Take 81 mg by mouth every morning.    Yes Historical Provider, MD  furosemide (LASIX) 40 MG tablet Take 1 tablet (40 mg total) by mouth daily. Fluid 08/21/12 08/21/13 Yes Rana Snare, NP  gabapentin (NEURONTIN) 300 MG capsule Take 300-600 mg by mouth 3 (three) times daily. TAKES 2 CAPSULES IN THE MORNING AND 2 AT LUNCH AND 1 AT NIGHT   Yes Historical Provider, MD  HYDROcodone-homatropine (HYCODAN) 5-1.5 MG/5ML syrup Take 5 mLs by mouth every 6 (six) hours as needed. COUGH   Yes Historical Provider, MD  isosorbide mononitrate (IMDUR) 30 MG 24 hr tablet Take 1 tablet (30 mg total) by mouth daily. 05/24/12 05/24/13 Yes Gaylord Shih, MD  losartan (COZAAR) 100 MG tablet Take 1 tablet by mouth daily.  10/17/11  Yes Historical Provider, MD  meloxicam (MOBIC) 15 MG tablet Take 15 mg by mouth daily.   Yes Historical Provider, MD  metoprolol  succinate (TOPROL-XL) 25 MG 24 hr tablet Take 25 mg by mouth every morning.    Yes Historical Provider, MD  nitroGLYCERIN (NITROSTAT) 0.4 MG SL tablet Place 0.4 mg under the tongue every 5 (five) minutes as needed. For chest pain.   Yes Historical Provider, MD  ondansetron (ZOFRAN-ODT) 8 MG disintegrating tablet Place 8 mg under the tongue every 8 (eight) hours as needed. For nausea. 04/13/12  Yes Historical Provider, MD  pravastatin (PRAVACHOL) 40 MG tablet Take 40 mg by mouth daily.   Yes Historical Provider, MD  vitamin B-12 (CYANOCOBALAMIN) 500 MCG tablet Take 500 mcg by mouth daily.   Yes Historical Provider, MD    Review of Systems:  Constitutional:  No weight loss, night sweats. Complaints of fevers, chills, fatigue Head&Eyes: No headache.  No vision loss.  No eye pain or scotoma ENT:  No Difficulty swallowing,Tooth/dental problems,Sore throat,  No ear ache, post nasal drip,  Cardio-vascular:  No chest pain, Orthopnea, PND,   dizziness, palpitations  GI:  No  abdominal pain, nausea, vomiting,  diarrhea, loss of appetite, hematochezia, melena, heartburn, indigestion, Resp:  No shortness of breath with exertion or at rest. No cough. No coughing up of blood .No wheezing.No chest wall deformity  Skin:  no rash or lesions.  GU:  no dysuria, change in color of urine, no urgency or frequency. No flank pain.  Musculoskeletal:  No joint pain or swelling. No decreased range of motion. No back pain.  Psych:  No change in mood or affect. No depression or anxiety. Neurologic: No headache, no dysesthesia, no focal weakness, no vision loss. No syncope  Physical Exam: Filed Vitals:   09/07/12 2115 09/07/12 2130 09/07/12 2145 09/07/12 2200  BP: 112/51 112/53 113/53 126/51  Pulse: 84 84 84 85  Temp:      TempSrc:      Resp: 24 24 21 23   SpO2: 93% 93% 95% 95%   General:  A&O x 3, NAD, nontoxic, pleasant/cooperative Head/Eye: No conjunctival hemorrhage, no icterus, Oriental/AT, No  nystagmus ENT:  No icterus,  No thrush, good dentition, no pharyngeal exudate; dry mucosal membranes Neck:  No masses, no lymphadenpathy, no bruits; left posterior neck wound without any erythema, drainage, or edema CV:  RRR, no rub, no gallop, no S3 Lung:  Basilar crackles, no wheezing, good air movement Abdomen: soft/NT, +BS, nondistended, no peritoneal signs Ext: No cyanosis, No rashes, No petechiae, No lymphangitis; right foot with open wound on the plantar surface of the first metatarsophalangeal joint area with serosanguineous drainage; there is erythema extending from the plantar surface of the first metatarsophalangeal joint area extending to the dorsal surface up to the ankle. No crepitance, no necrosis, no odor.   Labs on Admission:  Basic Metabolic Panel:  Lab 09/07/12 0981  NA 130*  K 3.5  CL 94*  CO2 27  GLUCOSE 199*  BUN 18  CREATININE 1.04  CALCIUM 9.1  MG --  PHOS --   Liver Function Tests:  Lab 09/07/12 1940  AST 17  ALT 11  ALKPHOS 122*  BILITOT 0.7  PROT 5.8*  ALBUMIN 3.4*   No results found for this basename: LIPASE:5,AMYLASE:5 in the last 168 hours No results found for this basename: AMMONIA:5 in the last 168 hours CBC:  Lab 09/07/12 1940  WBC 7.4  NEUTROABS 4.6  HGB 11.0*  HCT 32.6*  MCV 98.8  PLT 73*   Cardiac Enzymes: No results found for this basename: CKTOTAL:5,CKMB:5,CKMBINDEX:5,TROPONINI:5 in the last 168 hours BNP: No components found with this basename: POCBNP:5 CBG: No results found for this basename: GLUCAP:5 in the last 168 hours  Radiological Exams on Admission: Dg Chest 2 View  09/07/2012  *RADIOLOGY REPORT*  Clinical Data: Fever  CHEST - 2 VIEW  Comparison: 06/06/2012  Findings: There are changes from previous cardiac surgery.  The cardiac silhouette is mildly enlarged.  No mediastinal or hilar masses or adenopathy noted.  There are prominent bronchovascular markings, stable.  The lungs are otherwise clear.  No pleural  effusion or pneumothorax.  IMPRESSION: No acute cardiopulmonary disease.  No change from the prior study.   Original Report Authenticated By: Domenic Moras, M.D.    Dg Foot Complete Left  09/07/2012  *RADIOLOGY REPORT*  Clinical Data: Plantar wound.  Fever  LEFT FOOT - COMPLETE 3+ VIEW  Comparison: None.  Findings: Negative for fracture.  Negative for osteomyelitis.  No bony erosion.  Arterial calcification is present. Calcaneal spurring.  IMPRESSION: No acute bony abnormality.   Original Report Authenticated By: Camelia Phenes, M.D.  Time spent:70 minutes    Mayari Matus, DO  [facility] Pager [phone]  If 7PM-7AM, please contact night-coverage [email or web address] Password [email or web address] 09/07/2012, 10:25 PM

## 2012-09-07 NOTE — ED Notes (Signed)
Pt presents with c/o fever. Rectal temp arrival to room 22 103.8. Pt wife sts he has been confused today. sts yesterday patient felt fine and was acting normal. Pt hot to touch. Pt recently has left foot and squamous cell carcinoma removal to back of neck. Dressing intact and non saturated to neck. Patient left foot hot to touch, swelling and edema noted- redness up calf. Pt denies neck pain, back pain, groin pain or abd pain.

## 2012-09-08 ENCOUNTER — Observation Stay (HOSPITAL_COMMUNITY): Payer: Medicare Other

## 2012-09-08 DIAGNOSIS — C8313 Mantle cell lymphoma, intra-abdominal lymph nodes: Secondary | ICD-10-CM

## 2012-09-08 DIAGNOSIS — N400 Enlarged prostate without lower urinary tract symptoms: Secondary | ICD-10-CM

## 2012-09-08 DIAGNOSIS — R0989 Other specified symptoms and signs involving the circulatory and respiratory systems: Secondary | ICD-10-CM

## 2012-09-08 DIAGNOSIS — D649 Anemia, unspecified: Secondary | ICD-10-CM

## 2012-09-08 LAB — BASIC METABOLIC PANEL
BUN: 17 mg/dL (ref 6–23)
CO2: 26 mEq/L (ref 19–32)
GFR calc non Af Amer: 76 mL/min — ABNORMAL LOW (ref >90)
Glucose, Bld: 143 mg/dL — ABNORMAL HIGH (ref 70–99)
Potassium: 3.4 mEq/L — ABNORMAL LOW (ref 3.5–5.1)

## 2012-09-08 LAB — GLUCOSE, CAPILLARY: Glucose-Capillary: 141 mg/dL — ABNORMAL HIGH (ref 70–99)

## 2012-09-08 LAB — CBC
Hemoglobin: 10.1 g/dL — ABNORMAL LOW (ref 13.0–17.0)
MCH: 34.4 pg — ABNORMAL HIGH (ref 26.0–34.0)
MCHC: 34.9 g/dL (ref 30.0–36.0)
MCV: 98.3 fL (ref 78.0–100.0)

## 2012-09-08 LAB — URINE CULTURE: Colony Count: 5000

## 2012-09-08 LAB — PROTIME-INR: Prothrombin Time: 14 seconds (ref 11.6–15.2)

## 2012-09-08 MED ORDER — GADOBENATE DIMEGLUMINE 529 MG/ML IV SOLN
20.0000 mL | Freq: Once | INTRAVENOUS | Status: AC | PRN
  Administered 2012-09-08: 17 mL via INTRAVENOUS

## 2012-09-08 MED ORDER — FUROSEMIDE 40 MG PO TABS
40.0000 mg | ORAL_TABLET | Freq: Every day | ORAL | Status: DC
  Administered 2012-09-09: 40 mg via ORAL
  Filled 2012-09-08 (×2): qty 1

## 2012-09-08 MED ORDER — VANCOMYCIN HCL 1000 MG IV SOLR
1250.0000 mg | INTRAVENOUS | Status: DC
  Administered 2012-09-08: 1250 mg via INTRAVENOUS
  Filled 2012-09-08 (×2): qty 1250

## 2012-09-08 MED ORDER — POTASSIUM CHLORIDE CRYS ER 20 MEQ PO TBCR
40.0000 meq | EXTENDED_RELEASE_TABLET | Freq: Once | ORAL | Status: AC
  Administered 2012-09-08: 40 meq via ORAL
  Filled 2012-09-08: qty 2

## 2012-09-08 NOTE — Progress Notes (Signed)
ANTIBIOTIC CONSULT NOTE - INITIAL  Pharmacy Consult for vancomycin Indication: Cellulitis  Allergies  Allergen Reactions  . Azithromycin Itching  . Macrodantin Itching  . Minocycline Hcl Itching  . Nitrofurantoin Itching  . Zithromax (Azithromycin Dihydrate) Itching  . Contrast Media (Iodinated Diagnostic Agents) Rash    Patient Measurements: Height: 6\' 1"  (185.4 cm) Weight: 185 lb 3 oz (84 kg) (bed scale) IBW/kg (Calculated) : 79.9  Adjusted Body Weight:   Vital Signs: Temp: 99.1 F (37.3 C) (10/04 0005) Temp src: Oral (10/04 0005) BP: 133/70 mmHg (10/04 0005) Pulse Rate: 80  (10/04 0005) Intake/Output from previous day: 10/03 0701 - 10/04 0700 In: -  Out: 325 [Urine:325] Intake/Output from this shift: Total I/O In: -  Out: 325 [Urine:325]  Labs:  [facility] 09/07/12 1940  WBC 7.4  HGB 11.0*  PLT 73*  LABCREA --  CREATININE 1.04   Estimated Creatinine Clearance: 60.8 ml/min (by C-G formula based on Cr of 1.04). No results found for this basename: VANCOTROUGH:2,VANCOPEAK:2,VANCORANDOM:2,GENTTROUGH:2,GENTPEAK:2,GENTRANDOM:2,TOBRATROUGH:2,TOBRAPEAK:2,TOBRARND:2,AMIKACINPEAK:2,AMIKACINTROU:2,AMIKACIN:2, in the last 72 hours   Microbiology: No results found for this or any previous visit (from the past 720 hour(s)).  Medical History: Past Medical History  Diagnosis Date  . CAD (coronary artery disease)     a. s/p CABG 1995;  b. NSTEMI & subsequent BMS to SVG->right PDA 02/03/12.;  c. Cath 02/14/2012 3vd with 4/4 patent grafts and patent stent in vg->pda.  . Cerebrovascular disease, unspecified   . Unspecified essential hypertension   . Other and unspecified hyperlipidemia   . Type II or unspecified type diabetes mellitus without mention of complication, not stated as uncontrolled   . Osteoarthritis (arthritis due to wear and tear of joints)   . Asthmatic bronchitis   . BPH (benign prostatic hypertrophy)   . Herpes zoster   . Arthritis   . Anemia   .  Thrombocytopenia   . Aortic stenosis, moderate     a. Echo 02/14/12 EF 55%, Gr 2 DD, Mod AS (mean grad: 29, peak grad 52)  . Splenomegaly 04/13/2012  . History of blood transfusion 04/13/2012  . 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 Dermatitis 06/07/2012    acnieform rash on back 06/06/12    Medications:  Anti-infectives     Start     Dose/Rate Route Frequency Ordered Stop   09/08/12 1800   vancomycin (VANCOCIN) 1,250 mg in sodium chloride 0.9 % 250 mL IVPB        1,250 mg 166.7 mL/hr over 90 Minutes Intravenous Every 24 hours 09/08/12 0217     09/08/12 0600   piperacillin-tazobactam (ZOSYN) IVPB 3.375 g        3.375 g 100 mL/hr over 30 Minutes Intravenous 3 times per day 09/07/12 2331     09/07/12 2345   acyclovir (ZOVIRAX) tablet 400 mg        400 mg Oral 2 times daily 09/07/12 2331     09/07/12 2030   vancomycin (VANCOCIN) IVPB 1000 mg/200 mL premix        1,000 mg 200 mL/hr over 60 Minutes Intravenous  Once 09/07/12 1953 09/07/12 2246   09/07/12 2030   piperacillin-tazobactam (ZOSYN) IVPB 3.375 g        3.375 g 12.5 mL/hr over 240 Minutes Intravenous  Once 09/07/12 1953 09/08/12 0119         Assessment: Patient with cellulitis.  First dose of antibiotics already given in ED.  Goal of Therapy:  Vancomycin trough level 10-15 mcg/ml  Plan:  Measure antibiotic drug levels at steady state Follow up culture results Vancomycin 1250mg  iv q24hr  Darlina Guys, Jacquenette Shone Crowford 09/08/2012,2:18 AM

## 2012-09-08 NOTE — Progress Notes (Signed)
   CARE MANAGEMENT NOTE 09/08/2012  Patient:  [name], [name]   Account Number:  [id number]  Date Initiated:  09/08/2012  Documentation initiated by:  Jiles Crocker  Subjective/Objective Assessment:   ADMITTED WITH SEPSIS     Action/Plan:   PCP: Garlan Fillers, MD  LIVES AT HOME WITH SPOUSE   Anticipated DC Date:  09/15/2012   Anticipated DC Plan:  HOME/SELF CARE      DC Planning Services  CM consult               Status of service:  In process, will continue to follow Medicare Important Message given?  NA - LOS <3 / Initial given by admissions (If response is "NO", the following Medicare IM given date fields will be blank)  Per UR Regulation:  Reviewed for med. necessity/level of care/duration of stay  Comments:  09/08/2012- B Orris Perin RN, BSN, MHA

## 2012-09-08 NOTE — Progress Notes (Signed)
Patient ID: [name], male   DOB: 04-09-1927, 78 y.o.   MRN: [id number]  [facility] PROGRESS NOTE  [name] ION:[id number] DOB: [birth year] DOA: 09/07/2012 PCP: Garlan Fillers, MD  HPI:  78 year old male presented with a one-day history of fevers, chills, rigors. The patient went to see his primary care provider, Dr. Brunilda Payor, about 3 weeks ago for a blister on his left foot which was initially improving, however it has gotten progressively worse over the past 3 days. This was associated with redness and swelling, warm to touch, fevers as high as 104 Fahrenheit.   Assessment/Plan:  Sepsis  -The source of the infection appears to be cellulitis/foot infection  -Certainly, given his open wound the patient may very well have bacteremia  -Urinalysis does not suggest UTI  -Blood cultures have been obtained, continue empiric vancomycin and Zosyn  -MRI left foot consistent with cellulitis -Consulted wound care -Continue IVF Thrombocytopenia  -This appears to be chronic due to the patient's splenomegaly from his lymphoma  -Remains at baseline -No signs of active bleeding at this time  -SCDs for DVT prophylaxis due to thrombocytopenia  Hypokalemia -will supplement -BMP in AM Diastolic CHF exacerbation, grade II diastolic dysfunction per last 2 D ECHO 02/2012 -We'll discontinue IV fluids -Continue home dose of Lasix -BMP in AM Mantle cell lymphoma, stage III  -Patient has finished 5 of 6 cycles of bendamustine  -His next cycle was due on 09/11/2012  -Dr. Clelia Croft was consulted in the ED; he will see the patient in the am  Squamous cell carcinoma and basal cell carcinoma of the skin  -Incision on the left posterior neck does not appear to be infected  -Patient sees dermatology who recently performed a biopsy  Coronary artery disease status post bare metal stent April 2013  -Continue aspirin, Toprol, Imdur  -Hold losartan for now given the patient's relatively low  blood pressure   Consultants:  None  Procedures/Studies: Dg Chest 2 View 09/07/2012   IMPRESSION:  No acute cardiopulmonary disease.  No change from the prior study.     Mr Foot Left W Wo Contrast 09/08/2012   IMPRESSION:   1.  Cellulitis of the ball of the foot mainly around the head of the first metatarsal.  2.  Slight arthritic changes of the first metatarsal phalangeal joint with a small nonspecific effusion at that joint.  3.  No abscess or osteomyelitis.     Dg Foot Complete Left 09/07/2012     IMPRESSION:  No acute bony abnormality.    Antibiotics:  Vancomycin 10/03 -->  Zosyn 10/03 -->  Code Status: Full Family Communication: Pt at bedside Disposition Plan: Home when medically stable  HPI/Subjective: No events overnight.   Objective: Filed Vitals:   09/07/12 2315 09/08/12 0005 09/08/12 0505 09/08/12 1327  BP: 108/50 133/70 125/66 139/65  Pulse: 79 80 73 84  Temp:  99.1 F (37.3 C) 99 F (37.2 C) 98.5 F (36.9 C)  TempSrc:  Oral Oral Oral  Resp: 23 22 20 20   Height:  6\' 1"  (1.854 m)    Weight:  84 kg (185 lb 3 oz)    SpO2: 93% 98% 96% 96%    Intake/Output Summary (Last 24 hours) at 09/08/12 1557 Last data filed at 09/08/12 1300  Gross per 24 hour  Intake 1233.33 ml  Output   1050 ml  Net 183.33 ml    Exam:   General:  Pt is alert, follows commands appropriately, not in acute distress  Cardiovascular: Regular rate and rhythm, S1/S2, no murmurs, no rubs, no gallops  Respiratory: Clear to auscultation bilaterally, no wheezing, no crackles, no rhonchi  Abdomen: Soft, non tender, non distended, bowel sounds present, no guarding  Extremities: right foot with open wound on the plantar surface of the first metatarsophalangeal joint area with serosanguineous drainage; there is erythema extending from the plantar surface of the first metatarsophalangeal joint area extending to the dorsal surface up to the ankle. No crepitance, no necrosis, no  odor.  Neuro: Grossly nonfocal  Data Reviewed: Basic Metabolic Panel:  Lab 09/08/12 1610 09/07/12 1940  NA 134* 130*  K 3.4* 3.5  CL 99 94*  CO2 26 27  GLUCOSE 143* 199*  BUN 17 18  CREATININE 0.91 1.04  CALCIUM 8.8 9.1  MG -- --  PHOS -- --   Liver Function Tests:  Lab 09/07/12 1940  AST 17  ALT 11  ALKPHOS 122*  BILITOT 0.7  PROT 5.8*  ALBUMIN 3.4*   CBC:  Lab 09/08/12 0432 09/07/12 1940  WBC 6.0 7.4  NEUTROABS -- 4.6  HGB 10.1* 11.0*  HCT 28.9* 32.6*  MCV 98.3 98.8  PLT 70* 73*   CBG:  Lab 09/08/12 0004  GLUCAP 141*    Scheduled Meds:   . acetaminophen      . acyclovir  400 mg Oral BID  . aspirin EC  81 mg Oral Daily  . cyanocobalamin  500 mcg Oral Daily  . docusate sodium  100 mg Oral BID  . furosemide  40 mg Oral Daily  . gabapentin  300 mg Oral QHS  . gabapentin  600 mg Oral BID AC  . isosorbide mononitrate  30 mg Oral Daily  . metoprolol succinate  25 mg Oral Daily  . piperacillin-tazobactam (ZOSYN)  IV  3.375 g Intravenous Once  . piperacillin-tazobactam  3.375 g Intravenous Q8H  . potassium chloride  40 mEq Oral Once  . simvastatin  20 mg Oral q1800  . sodium chloride  500 mL Intravenous Once  . sodium chloride  3 mL Intravenous Q12H  . vancomycin  1,250 mg Intravenous Q24H  . vancomycin  1,000 mg Intravenous Once   Continuous Infusions:   . DISCONTD: sodium chloride 100 mL/hr at 09/08/12 0010     Debbora Presto, MD  [facility] Pager [phone]  If 7PM-7AM, please contact night-coverage [email or web address] Password [email or web address] 09/08/2012, 3:57 PM   LOS: 1 day

## 2012-09-08 NOTE — Consult Note (Signed)
WOC consult Note Reason for Consult: eval foot wound.  Reports debrided at MD office a few weeks ago and now presents with open neuropathic foot wound on the plantar surface at the first metatarsal head.  Will need follow up management with offloading of this wound for healing. Recommend wound care center follow up at discharge Wound type:neuropathic foot ulceration left foot Measurement:2.0cm x 1.0cm x 0.5cm Wound bed: pink and moist in center, with 10% yellow slough noted at [facility], has some signigicant hyperkeratosis of the periwound Drainage (amount, consistency, odor) moderate serous drainage with no odor. Periwound: see above Dressing procedure/placement/frequency:  Will order saline dressings for now, to be changed daily per bedside nursing staff.   MRI of the foot reports no osteomyelitis. Topical therapy and offloading should suffice for wound healing.  [place] Woon [place], [place] [phone]

## 2012-09-09 LAB — BASIC METABOLIC PANEL
BUN: 15 mg/dL (ref 6–23)
CO2: 25 mEq/L (ref 19–32)
Calcium: 8.9 mg/dL (ref 8.4–10.5)
Chloride: 98 mEq/L (ref 96–112)
Creatinine, Ser: 0.98 mg/dL (ref 0.50–1.35)
GFR calc Af Amer: 86 mL/min — ABNORMAL LOW (ref >90)
GFR calc non Af Amer: 74 mL/min — ABNORMAL LOW (ref >90)
Glucose, Bld: 129 mg/dL — ABNORMAL HIGH (ref 70–99)
Potassium: 3.9 mEq/L (ref 3.5–5.1)
Sodium: 132 mEq/L — ABNORMAL LOW (ref 135–145)

## 2012-09-09 LAB — CBC
Platelets: 71 10*3/uL — ABNORMAL LOW (ref 150–400)
RBC: 3.07 MIL/uL — ABNORMAL LOW (ref 4.22–5.81)
WBC: 5.8 10*3/uL (ref 4.0–10.5)

## 2012-09-09 MED ORDER — AMOXICILLIN-POT CLAVULANATE 875-125 MG PO TABS: 1.0000 | ORAL_TABLET | Freq: Two times a day (BID) | ORAL | Status: DC

## 2012-09-09 MED ORDER — DIPHENHYDRAMINE HCL 50 MG/ML IJ SOLN
12.5000 mg | Freq: Four times a day (QID) | INTRAMUSCULAR | Status: DC | PRN
  Administered 2012-09-09: 25 mg via INTRAVENOUS
  Filled 2012-09-09: qty 1

## 2012-09-09 NOTE — Care Management Note (Signed)
    Page 1 of 2   09/09/2012     12:37:40 PM   CARE MANAGEMENT NOTE 09/09/2012  Patient:  [name], [name]   Account Number:  [id number]  Date Initiated:  09/08/2012  Documentation initiated by:  Jiles Crocker  Subjective/Objective Assessment:   ADMITTED WITH SEPSIS     Action/Plan:   PCP: Garlan Fillers, MD  LIVES AT HOME WITH SPOUSE   Anticipated DC Date:  09/15/2012   Anticipated DC Plan:  HOME/SELF CARE      DC Planning Services  CM consult      [facility] Choice  HOME HEALTH   Choice offered to / List presented to:          [facility] arranged  [facility] RN      [facility] agency  [facility]   Status of service:  Completed, signed off Medicare Important Message given?  NA - LOS <3 / Initial given by admissions (If response is "NO", the following Medicare IM given date fields will be blank) Date Medicare IM given:   Date Additional Medicare IM given:    Discharge Disposition:  HOME W HOME HEALTH SERVICES  Per UR Regulation:  Reviewed for med. necessity/level of care/duration of stay  If discussed at Long Length of Stay Meetings, dates discussed:    Comments:  09/09/2012  12:35pm  Konrad Felix RN, case manager   [phone] Consult for [facility] RN for wound care; referral made to [facility] who will in turn contact the patient and set up an appointment.  09/08/2012- B CHANDLER RN, BSN, MHA

## 2012-09-09 NOTE — Discharge Summary (Signed)
Physician Discharge Summary  [name] XBJ:[id number] DOB: 17-Sep-1927 DOA: 09/07/2012  PCP: Garlan Fillers, MD  Admit date: 09/07/2012 Discharge date: 09/09/2012  Recommendations for Outpatient Follow-up:  1. Pt will need to follow up with PCP in 2-3 weeks post discharge 2. Please obtain BMP to evaluate electrolytes and kidney function 3. Please also check CBC to evaluate Hg and Hct levels 4. Please note that pt was advised to remain in the hospital but was insisting on going home today 5. I have explained the need for IV antibiotics and his main fear was the potential for an allergic reaction and was already thinking he is allergic to Zosyn and Vanc 6. I have started him on PO Augment as discussed with ID specialist and made pt aware it is PO antibiotic and if blood cultures are positive he will likely need IV therapy 7. Pt will also continue wound care in an outpatient setting, information was provided 8. Pt has verbalized understanding of risks involved by leaving the hospital  Discharge Diagnoses: Left foot cellulitis Active Problems:  Cellulitis of foot  Sepsis  Discharge Condition: Stable  Diet recommendation: Heart healthy diet discussed in details   HPI:  78 year old male presented with a one-day history of fevers, chills, rigors. The patient went to see his primary care provider, Dr. Brunilda Payor, about 3 weeks ago for a blister on his left foot which was initially improving, however it has gotten progressively worse over the past 3 days. This was associated with redness and swelling, warm to touch, fevers as high as 104 Fahrenheit.   Assessment/Plan:  Sepsis  - The source of the infection appears to be cellulitis/foot infection  - Certainly, given his open wound the patient may very well have bacteremia  - Urinalysis does not suggest UTI  - Blood cultures have been obtained but are still pending  - broad spectrum antibiotic Vancomycin and Zosyn were started - MRI  left foot consistent with cellulitis  - Consulted wound care and pt will need to have follow up in next week for continuation of care - please note that pt was advised he needs to stay in the hospital for further treatment but was insisting on going home today - I have reviewed choice of antibiotic with ID specialist given multiple antibiotic allergies and Augment PO seemed to be the best choice with adequate coverage - pt advised of possible allergic reaction and need to call 911 or come to the ER if he develops an allergic reaction  Thrombocytopenia  -This appears to be chronic due to the patient's splenomegaly from his lymphoma  -Remains at baseline  -No signs of active bleeding at this time  -SCDs for DVT prophylaxis due to thrombocytopenia   Hypokalemia  -was supplement ed  Diastolic CHF exacerbation, grade II diastolic dysfunction per last 2 D ECHO 02/2012  - IVF given initially due to possibility of sepsis and dehydration - 24 hours after hospitalization pt has significantly improved and IVF were discontinued -Continued home dose of Lasix   Mantle cell lymphoma, stage III  -Patient has finished 5 of 6 cycles of bendamustine  -His next cycle was due on 09/11/2012  -Dr. Clelia Croft was consulted in the ED; he will see the patient in the am  - I will notify Dr. Cyndie Chime and ask about potential deferral on chemotx until infection resilved  Squamous cell carcinoma and basal cell carcinoma of the skin  -Incision on the left posterior neck does not appear to be infected  -  Patient sees dermatology who recently performed a biopsy   Coronary artery disease status post bare metal stent April 2013  -Continue aspirin, Toprol, Imdur  -Held losartan upon admission given the patient's relatively low blood pressure  - BP has improved and Losartan was restarted one day prior to discharge  Consultants:  None  Procedures/Studies:  Dg Chest 2 View  09/07/2012  IMPRESSION:  No acute  cardiopulmonary disease. No change from the prior study.   Mr Foot Left W Wo Contrast  09/08/2012  IMPRESSION:  1. Cellulitis of the ball of the foot mainly around the head of the first metatarsal.  2. Slight arthritic changes of the first metatarsal phalangeal joint with a small nonspecific effusion at that joint.  3. No abscess or osteomyelitis.   Dg Foot Complete Left  09/07/2012  IMPRESSION:  No acute bony abnormality.   Antibiotics:  Vancomycin 10/03 --> 10/05 Zosyn 10/03 --> 10/05 Augmentin 10/05 --> discharged on the medication   Code Status: Full  Family Communication: Pt and wife at bedside    Discharge Exam: Filed Vitals:   09/09/12 0645  BP: 151/71  Pulse: 85  Temp: 99 F (37.2 C)  Resp: 18   Filed Vitals:   09/08/12 0505 09/08/12 1327 09/08/12 2020 09/09/12 0645  BP: 125/66 139/65 115/69 151/71  Pulse: 73 84 93 85  Temp: 99 F (37.2 C) 98.5 F (36.9 C) 99.9 F (37.7 C) 99 F (37.2 C)  TempSrc: Oral Oral Oral Oral  Resp: 20 20 16 18   Height:      Weight:      SpO2: 96% 96% 95% 94%    General: Pt is alert, follows commands appropriately, not in acute distress Cardiovascular: Regular rate and rhythm, S1/S2 +, no murmurs, no rubs, no gallops Respiratory: Clear to auscultation bilaterally, no wheezing, no crackles, no rhonchi Abdominal: Soft, non tender, non distended, bowel sounds +, no guarding Extremities: left foot swollen extending to the level of left ankle but improved since yesterday, erythematous and not tender to palpation, DP and PT pulses present bilaterally Neuro: Grossly nonfocal  Discharge Instructions  Discharge Orders    Future Appointments: Provider: Department: Dept Phone: Center:   09/11/2012 9:15 AM Beverely Pace Sh[facility] [phone] None   09/11/2012 9:45 AM [facility] [phone] None   09/12/2012 12:30 PM [facility] [phone] None   10/16/2012 9:45 AM Dava Najjar Idelle Jo  [facility] [phone] None   10/16/2012 10:30 AM [facility] [phone] [place]   10/23/2012 2:30 PM Levert Feinstein, MD [facility] [phone] None     Future Orders Please Complete By Expires   Diet - low sodium heart healthy      Increase activity slowly          Medication List     As of 09/09/2012 11:21 AM    TAKE these medications         acyclovir 400 MG tablet   Commonly known as: ZOVIRAX   Take 400 mg by mouth 2 (two) times daily.      albuterol 108 (90 BASE) MCG/ACT inhaler   Commonly known as: PROVENTIL HFA;VENTOLIN HFA   Inhale 2 puffs into the lungs every 6 (six) hours as needed. WHEEZING AND SHORTNESS OF BREATH      amoxicillin-clavulanate 875-125 MG per tablet   Commonly known as: AUGMENTIN   Take 1 tablet by mouth 2 (two) times daily.      aspirin EC 81  MG tablet   Take 81 mg by mouth every morning.      furosemide 40 MG tablet   Commonly known as: LASIX   Take 1 tablet (40 mg total) by mouth daily. Fluid      gabapentin 300 MG capsule   Commonly known as: NEURONTIN   Take 300-600 mg by mouth 3 (three) times daily. TAKES 2 CAPSULES IN THE MORNING AND 2 AT LUNCH AND 1 AT NIGHT      HYDROcodone-homatropine 5-1.5 MG/5ML syrup   Commonly known as: HYCODAN   Take 5 mLs by mouth every 6 (six) hours as needed. COUGH      isosorbide mononitrate 30 MG 24 hr tablet   Commonly known as: IMDUR   Take 1 tablet (30 mg total) by mouth daily.      losartan 100 MG tablet   Commonly known as: COZAAR   Take 1 tablet by mouth daily.      meloxicam 15 MG tablet   Commonly known as: MOBIC   Take 15 mg by mouth daily.      metoprolol succinate 25 MG 24 hr tablet   Commonly known as: TOPROL-XL   Take 25 mg by mouth every morning.      nitroGLYCERIN 0.4 MG SL tablet   Commonly known as: NITROSTAT   Place 0.4 mg under the tongue every 5 (five) minutes as needed. For chest pain.      ondansetron 8 MG disintegrating tablet    Commonly known as: ZOFRAN-ODT   Place 8 mg under the tongue every 8 (eight) hours as needed. For nausea.      pravastatin 40 MG tablet   Commonly known as: PRAVACHOL   Take 40 mg by mouth daily.      vitamin B-12 500 MCG tablet   Commonly known as: CYANOCOBALAMIN   Take 500 mcg by mouth daily.           Follow-up Information    Follow up with Garlan Fillers, MD. In 1 week.   Contact information:   [address] [facility] [place] [address] [phone]       Please follow up.   Contact information:   Dr. Izola Price [phone]          The results of significant diagnostics from this hospitalization (including imaging, microbiology, ancillary and laboratory) are listed below for reference.     Microbiology: Recent Results (from the past 240 hour(s))  URINE CULTURE     Status: Normal   Collection Time   09/07/12  7:54 PM      Component Value Range Status Comment   Specimen Description URINE, RANDOM   Final    Special Requests NONE   Final    Culture  Setup Time 09/08/2012 02:31   Final    Colony Count 5,000 COLONIES/ML   Final    Culture INSIGNIFICANT GROWTH   Final    Report Status 09/08/2012 FINAL   Final      Labs: Basic Metabolic Panel:  Lab 09/09/12 5643 09/08/12 0432 09/07/12 1940  NA 132* 134* 130*  K 3.9 3.4* 3.5  CL 98 99 94*  CO2 25 26 27   GLUCOSE 129* 143* 199*  BUN 15 17 18   CREATININE 0.98 0.91 1.04  CALCIUM 8.9 8.8 9.1  MG -- -- --  PHOS -- -- --   Liver Function Tests:  Lab 09/07/12 1940  AST 17  ALT 11  ALKPHOS 122*  BILITOT 0.7  PROT 5.8*  ALBUMIN 3.4*   No results found for this basename: LIPASE:5,AMYLASE:5 in the last 168 hours No results found for this basename: AMMONIA:5 in the last 168 hours CBC:  Lab 09/09/12 0457 09/08/12 0432 09/07/12 1940  WBC 5.8 6.0 7.4  NEUTROABS -- -- 4.6  HGB 10.3* 10.1* 11.0*  HCT 30.0* 28.9* 32.6*  MCV 97.7 98.3 98.8  PLT 71* 70* 73*   Cardiac Enzymes: No  results found for this basename: CKTOTAL:5,CKMB:5,CKMBINDEX:5,TROPONINI:5 in the last 168 hours BNP: BNP (last 3 results)  Basename 02/13/12 0003 02/04/12 0445 02/02/12 0809  PROBNP 2361.0* 9172.0* 5711.0*   CBG:  Lab 09/08/12 0004  GLUCAP 141*     SIGNED: Time coordinating discharge: Over 30 minutes  Debbora Presto, MD  [facility] 09/09/2012, 11:21 AM Pager [phone]  If 7PM-7AM, please contact night-coverage [email or web address] Password [email or web address]

## 2012-09-09 NOTE — Progress Notes (Signed)
D/c home accomp by wife. All d/c instructions reviewed with pt and wife with understanding verb.,. Dr. Izola Price informed pt recommended to f/u at [facility] for L foot wound per WOC nurse.  Stated she will call and leave referal at wound care center.  Wife given number of care center to call on Monday if she does not receive call from center. dsg changed this a.m. With left foot wound dry, minimal light yellow fluid drainage.  New saline dressing reapplied.  Pt calm, in good spirits when left.  Stated itching had decreased on back after benadryl. Stated he will take some at home for continued itching if needed.

## 2012-09-11 ENCOUNTER — Other Ambulatory Visit: Payer: Medicare Other | Admitting: Lab

## 2012-09-11 ENCOUNTER — Ambulatory Visit: Payer: Medicare Other

## 2012-09-11 NOTE — Progress Notes (Signed)
Contacted patient when he had not arrived by 1008.  Per patients wife, pt was inpatient Thursday to Saturday under care of Dr. Izola Price with cellulitis and blood stream infection.  She states that Dr. Izola Price coordinated with Dr. Cyndie Chime, and that Dr. Cyndie Chime had cancelled the patient's chemo treatments until the blood infection cleared up.

## 2012-09-12 ENCOUNTER — Ambulatory Visit: Payer: Medicare Other

## 2012-09-12 ENCOUNTER — Other Ambulatory Visit (HOSPITAL_COMMUNITY): Payer: Self-pay | Admitting: Oncology

## 2012-09-12 DIAGNOSIS — C8313 Mantle cell lymphoma, intra-abdominal lymph nodes: Secondary | ICD-10-CM

## 2012-09-13 ENCOUNTER — Encounter (HOSPITAL_BASED_OUTPATIENT_CLINIC_OR_DEPARTMENT_OTHER): Payer: Medicare Other | Attending: General Surgery

## 2012-09-13 ENCOUNTER — Encounter (HOSPITAL_COMMUNITY): Payer: Self-pay | Admitting: Emergency Medicine

## 2012-09-13 ENCOUNTER — Inpatient Hospital Stay (HOSPITAL_COMMUNITY)
Admission: EM | Admit: 2012-09-13 | Discharge: 2012-09-19 | DRG: 854 | Disposition: A | Discharge status: Home Health | Payer: Medicare Other | Attending: Internal Medicine | Admitting: Internal Medicine

## 2012-09-13 DIAGNOSIS — N400 Enlarged prostate without lower urinary tract symptoms: Secondary | ICD-10-CM

## 2012-09-13 DIAGNOSIS — R609 Edema, unspecified: Secondary | ICD-10-CM

## 2012-09-13 DIAGNOSIS — R079 Chest pain, unspecified: Secondary | ICD-10-CM

## 2012-09-13 DIAGNOSIS — C8313 Mantle cell lymphoma, intra-abdominal lymph nodes: Secondary | ICD-10-CM

## 2012-09-13 DIAGNOSIS — C8319 Mantle cell lymphoma, extranodal and solid organ sites: Secondary | ICD-10-CM | POA: Diagnosis present

## 2012-09-13 DIAGNOSIS — D539 Nutritional anemia, unspecified: Secondary | ICD-10-CM

## 2012-09-13 DIAGNOSIS — E1149 Type 2 diabetes mellitus with other diabetic neurological complication: Secondary | ICD-10-CM | POA: Diagnosis present

## 2012-09-13 DIAGNOSIS — L02619 Cutaneous abscess of unspecified foot: Secondary | ICD-10-CM | POA: Insufficient documentation

## 2012-09-13 DIAGNOSIS — I350 Nonrheumatic aortic (valve) stenosis: Secondary | ICD-10-CM

## 2012-09-13 DIAGNOSIS — L97509 Non-pressure chronic ulcer of other part of unspecified foot with unspecified severity: Secondary | ICD-10-CM | POA: Insufficient documentation

## 2012-09-13 DIAGNOSIS — J984 Other disorders of lung: Secondary | ICD-10-CM

## 2012-09-13 DIAGNOSIS — J189 Pneumonia, unspecified organism: Secondary | ICD-10-CM

## 2012-09-13 DIAGNOSIS — I359 Nonrheumatic aortic valve disorder, unspecified: Secondary | ICD-10-CM | POA: Diagnosis present

## 2012-09-13 DIAGNOSIS — L98499 Non-pressure chronic ulcer of skin of other sites with unspecified severity: Secondary | ICD-10-CM | POA: Diagnosis present

## 2012-09-13 DIAGNOSIS — L03119 Cellulitis of unspecified part of limb: Secondary | ICD-10-CM

## 2012-09-13 DIAGNOSIS — D696 Thrombocytopenia, unspecified: Secondary | ICD-10-CM

## 2012-09-13 DIAGNOSIS — C8589 Other specified types of non-Hodgkin lymphoma, extranodal and solid organ sites: Secondary | ICD-10-CM | POA: Insufficient documentation

## 2012-09-13 DIAGNOSIS — D649 Anemia, unspecified: Secondary | ICD-10-CM

## 2012-09-13 DIAGNOSIS — I2510 Atherosclerotic heart disease of native coronary artery without angina pectoris: Secondary | ICD-10-CM

## 2012-09-13 DIAGNOSIS — R161 Splenomegaly, not elsewhere classified: Secondary | ICD-10-CM

## 2012-09-13 DIAGNOSIS — E785 Hyperlipidemia, unspecified: Secondary | ICD-10-CM

## 2012-09-13 DIAGNOSIS — E119 Type 2 diabetes mellitus without complications: Secondary | ICD-10-CM

## 2012-09-13 DIAGNOSIS — R600 Localized edema: Secondary | ICD-10-CM

## 2012-09-13 DIAGNOSIS — I739 Peripheral vascular disease, unspecified: Secondary | ICD-10-CM | POA: Diagnosis present

## 2012-09-13 DIAGNOSIS — M792 Neuralgia and neuritis, unspecified: Secondary | ICD-10-CM

## 2012-09-13 DIAGNOSIS — E1169 Type 2 diabetes mellitus with other specified complication: Secondary | ICD-10-CM | POA: Insufficient documentation

## 2012-09-13 DIAGNOSIS — C8590 Non-Hodgkin lymphoma, unspecified, unspecified site: Secondary | ICD-10-CM

## 2012-09-13 DIAGNOSIS — E1142 Type 2 diabetes mellitus with diabetic polyneuropathy: Secondary | ICD-10-CM | POA: Diagnosis present

## 2012-09-13 DIAGNOSIS — I679 Cerebrovascular disease, unspecified: Secondary | ICD-10-CM

## 2012-09-13 DIAGNOSIS — I252 Old myocardial infarction: Secondary | ICD-10-CM

## 2012-09-13 DIAGNOSIS — L309 Dermatitis, unspecified: Secondary | ICD-10-CM

## 2012-09-13 DIAGNOSIS — A419 Sepsis, unspecified organism: Principal | ICD-10-CM

## 2012-09-13 DIAGNOSIS — B0222 Postherpetic trigeminal neuralgia: Secondary | ICD-10-CM | POA: Diagnosis present

## 2012-09-13 DIAGNOSIS — L0390 Cellulitis, unspecified: Secondary | ICD-10-CM

## 2012-09-13 DIAGNOSIS — Z951 Presence of aortocoronary bypass graft: Secondary | ICD-10-CM

## 2012-09-13 DIAGNOSIS — I10 Essential (primary) hypertension: Secondary | ICD-10-CM

## 2012-09-13 DIAGNOSIS — R0989 Other specified symptoms and signs involving the circulatory and respiratory systems: Secondary | ICD-10-CM

## 2012-09-13 DIAGNOSIS — Z9289 Personal history of other medical treatment: Secondary | ICD-10-CM

## 2012-09-13 DIAGNOSIS — M869 Osteomyelitis, unspecified: Secondary | ICD-10-CM | POA: Diagnosis present

## 2012-09-13 HISTORY — DX: Peripheral vascular disease, unspecified: I73.9

## 2012-09-13 HISTORY — DX: Cardiac murmur, unspecified: R01.1

## 2012-09-13 HISTORY — DX: Chronic obstructive pulmonary disease, unspecified: J44.9

## 2012-09-13 LAB — CBC WITH DIFFERENTIAL/PLATELET
Basophils Absolute: 0 10*3/uL (ref 0.0–0.1)
Basophils Relative: 0 % (ref 0–1)
Eosinophils Absolute: 0.2 10*3/uL (ref 0.0–0.7)
HCT: 31.8 % — ABNORMAL LOW (ref 39.0–52.0)
Hemoglobin: 11.2 g/dL — ABNORMAL LOW (ref 13.0–17.0)
Lymphs Abs: 1.1 10*3/uL (ref 0.7–4.0)
MCHC: 35.2 g/dL (ref 30.0–36.0)
MCV: 96.7 fL (ref 78.0–100.0)
Monocytes Relative: 9 % (ref 3–12)
Neutro Abs: 6 10*3/uL (ref 1.7–7.7)
RDW: 14.6 % (ref 11.5–15.5)

## 2012-09-13 LAB — LACTIC ACID, PLASMA: Lactic Acid, Venous: 2 mmol/L (ref 0.5–2.2)

## 2012-09-13 LAB — COMPREHENSIVE METABOLIC PANEL
Albumin: 3.1 g/dL — ABNORMAL LOW (ref 3.5–5.2)
Alkaline Phosphatase: 142 U/L — ABNORMAL HIGH (ref 39–117)
BUN: 20 mg/dL (ref 6–23)
Chloride: 94 mEq/L — ABNORMAL LOW (ref 96–112)
Creatinine, Ser: 0.84 mg/dL (ref 0.50–1.35)
GFR calc Af Amer: >90 mL/min (ref >90)
Glucose, Bld: 200 mg/dL — ABNORMAL HIGH (ref 70–99)
Total Bilirubin: 0.4 mg/dL (ref 0.3–1.2)

## 2012-09-13 LAB — URINALYSIS, ROUTINE W REFLEX MICROSCOPIC
Bilirubin Urine: NEGATIVE
Ketones, ur: NEGATIVE mg/dL
Nitrite: NEGATIVE
Protein, ur: 30 mg/dL — AB
Specific Gravity, Urine: 1.02 (ref 1.005–1.030)
Urobilinogen, UA: 0.2 mg/dL (ref 0.0–1.0)

## 2012-09-13 LAB — PROCALCITONIN: Procalcitonin: 0.12 ng/mL

## 2012-09-13 MED ORDER — SODIUM CHLORIDE 0.9 % IV SOLN
1000.0000 mL | Freq: Once | INTRAVENOUS | Status: AC
  Administered 2012-09-13: 1000 mL via INTRAVENOUS

## 2012-09-13 MED ORDER — PIPERACILLIN-TAZOBACTAM 3.375 G IVPB 30 MIN
3.3750 g | Freq: Once | INTRAVENOUS | Status: AC
  Administered 2012-09-13: 3.375 g via INTRAVENOUS
  Filled 2012-09-13: qty 50

## 2012-09-13 MED ORDER — MELOXICAM 15 MG PO TABS
15.0000 mg | ORAL_TABLET | Freq: Every day | ORAL | Status: DC
  Administered 2012-09-14 – 2012-09-19 (×6): 15 mg via ORAL
  Filled 2012-09-13 (×6): qty 1

## 2012-09-13 MED ORDER — VANCOMYCIN HCL IN DEXTROSE 1-5 GM/200ML-% IV SOLN
1000.0000 mg | Freq: Once | INTRAVENOUS | Status: AC
  Administered 2012-09-13: 1000 mg via INTRAVENOUS
  Filled 2012-09-13: qty 200

## 2012-09-13 MED ORDER — INSULIN ASPART 100 UNIT/ML ~~LOC~~ SOLN: 0.0000 [IU] | Freq: Three times a day (TID) | SUBCUTANEOUS | Status: DC

## 2012-09-13 MED ORDER — ENOXAPARIN SODIUM 40 MG/0.4ML ~~LOC~~ SOLN
40.0000 mg | SUBCUTANEOUS | Status: DC
  Administered 2012-09-13: 40 mg via SUBCUTANEOUS
  Filled 2012-09-13 (×2): qty 0.4

## 2012-09-13 MED ORDER — ASPIRIN EC 81 MG PO TBEC
81.0000 mg | DELAYED_RELEASE_TABLET | Freq: Every day | ORAL | Status: DC
  Administered 2012-09-14 – 2012-09-19 (×6): 81 mg via ORAL
  Filled 2012-09-13 (×6): qty 1

## 2012-09-13 MED ORDER — PIPERACILLIN-TAZOBACTAM 3.375 G IVPB
3.3750 g | Freq: Three times a day (TID) | INTRAVENOUS | Status: DC
  Administered 2012-09-14 – 2012-09-19 (×17): 3.375 g via INTRAVENOUS
  Filled 2012-09-13 (×18): qty 50

## 2012-09-13 MED ORDER — ALUM & MAG HYDROXIDE-SIMETH 200-200-20 MG/5ML PO SUSP: 30.0000 mL | Freq: Four times a day (QID) | ORAL | Status: DC | PRN

## 2012-09-13 MED ORDER — ZOLPIDEM TARTRATE 5 MG PO TABS
5.0000 mg | ORAL_TABLET | Freq: Every evening | ORAL | Status: DC | PRN
  Administered 2012-09-18: 5 mg via ORAL
  Filled 2012-09-13: qty 1

## 2012-09-13 MED ORDER — GABAPENTIN 300 MG PO CAPS: 300.0000 mg | ORAL_CAPSULE | Freq: Three times a day (TID) | ORAL | Status: DC

## 2012-09-13 MED ORDER — ONDANSETRON 8 MG PO TBDP
8.0000 mg | ORAL_TABLET | Freq: Three times a day (TID) | ORAL | Status: DC | PRN
  Filled 2012-09-13: qty 1

## 2012-09-13 MED ORDER — POTASSIUM CHLORIDE IN NACL 20-0.9 MEQ/L-% IV SOLN
INTRAVENOUS | Status: AC
  Administered 2012-09-13 – 2012-09-14 (×2): via INTRAVENOUS
  Administered 2012-09-15: 100 mL/h via INTRAVENOUS
  Filled 2012-09-13 (×7): qty 1000

## 2012-09-13 MED ORDER — ACETAMINOPHEN 325 MG PO TABS
650.0000 mg | ORAL_TABLET | Freq: Four times a day (QID) | ORAL | Status: DC | PRN
  Administered 2012-09-13: 650 mg via ORAL
  Filled 2012-09-13: qty 2

## 2012-09-13 MED ORDER — ACETAMINOPHEN 325 MG PO TABS
650.0000 mg | ORAL_TABLET | Freq: Once | ORAL | Status: AC
  Administered 2012-09-13: 650 mg via ORAL

## 2012-09-13 MED ORDER — GABAPENTIN 300 MG PO CAPS
300.0000 mg | ORAL_CAPSULE | Freq: Every day | ORAL | Status: DC
  Administered 2012-09-13 – 2012-09-18 (×6): 300 mg via ORAL
  Filled 2012-09-13 (×7): qty 1

## 2012-09-13 MED ORDER — SIMVASTATIN 10 MG PO TABS
10.0000 mg | ORAL_TABLET | Freq: Every day | ORAL | Status: DC
  Administered 2012-09-14 – 2012-09-18 (×5): 10 mg via ORAL
  Filled 2012-09-13 (×7): qty 1

## 2012-09-13 MED ORDER — SODIUM CHLORIDE 0.9 % IJ SOLN
3.0000 mL | Freq: Two times a day (BID) | INTRAMUSCULAR | Status: DC
  Administered 2012-09-13 – 2012-09-17 (×4): 3 mL via INTRAVENOUS

## 2012-09-13 MED ORDER — OXYCODONE HCL 5 MG PO TABS
5.0000 mg | ORAL_TABLET | ORAL | Status: DC | PRN
  Administered 2012-09-15 – 2012-09-17 (×3): 5 mg via ORAL
  Filled 2012-09-13 (×3): qty 1

## 2012-09-13 MED ORDER — ACETAMINOPHEN 325 MG PO TABS
ORAL_TABLET | ORAL | Status: AC
  Filled 2012-09-13: qty 2

## 2012-09-13 MED ORDER — INSULIN ASPART 100 UNIT/ML ~~LOC~~ SOLN
0.0000 [IU] | Freq: Every day | SUBCUTANEOUS | Status: DC
  Administered 2012-09-14: 3 [IU] via SUBCUTANEOUS

## 2012-09-13 MED ORDER — METOPROLOL SUCCINATE ER 25 MG PO TB24
25.0000 mg | ORAL_TABLET | Freq: Every day | ORAL | Status: DC
  Administered 2012-09-15 – 2012-09-19 (×5): 25 mg via ORAL
  Filled 2012-09-13 (×6): qty 1

## 2012-09-13 MED ORDER — BISACODYL 5 MG PO TBEC: 5.0000 mg | DELAYED_RELEASE_TABLET | Freq: Every day | ORAL | Status: DC | PRN

## 2012-09-13 MED ORDER — ISOSORBIDE MONONITRATE ER 30 MG PO TB24
30.0000 mg | ORAL_TABLET | Freq: Every day | ORAL | Status: DC
  Administered 2012-09-14 – 2012-09-19 (×6): 30 mg via ORAL
  Filled 2012-09-13 (×6): qty 1

## 2012-09-13 MED ORDER — ACETAMINOPHEN 650 MG RE SUPP: 650.0000 mg | Freq: Four times a day (QID) | RECTAL | Status: DC | PRN

## 2012-09-13 MED ORDER — ACYCLOVIR 400 MG PO TABS
400.0000 mg | ORAL_TABLET | Freq: Two times a day (BID) | ORAL | Status: DC
  Administered 2012-09-13 – 2012-09-19 (×12): 400 mg via ORAL
  Filled 2012-09-13 (×13): qty 1

## 2012-09-13 MED ORDER — CYANOCOBALAMIN 500 MCG PO TABS
500.0000 ug | ORAL_TABLET | Freq: Every day | ORAL | Status: DC
  Administered 2012-09-14 – 2012-09-19 (×6): 500 ug via ORAL
  Filled 2012-09-13 (×6): qty 1

## 2012-09-13 MED ORDER — VANCOMYCIN HCL IN DEXTROSE 1-5 GM/200ML-% IV SOLN
1000.0000 mg | Freq: Two times a day (BID) | INTRAVENOUS | Status: DC
  Administered 2012-09-14 – 2012-09-19 (×11): 1000 mg via INTRAVENOUS
  Filled 2012-09-13 (×12): qty 200

## 2012-09-13 MED ORDER — SODIUM CHLORIDE 0.9 % IV SOLN
1000.0000 mL | INTRAVENOUS | Status: DC
  Administered 2012-09-13: 1000 mL via INTRAVENOUS

## 2012-09-13 MED ORDER — GABAPENTIN 300 MG PO CAPS
600.0000 mg | ORAL_CAPSULE | Freq: Two times a day (BID) | ORAL | Status: DC
  Administered 2012-09-14 – 2012-09-19 (×10): 600 mg via ORAL
  Administered 2012-09-19: 300 mg via ORAL
  Filled 2012-09-13 (×15): qty 2

## 2012-09-13 NOTE — Progress Notes (Signed)
ANTIBIOTIC CONSULT NOTE - INITIAL  Pharmacy Consult for Vanc/Zosyn Indication: Sepsis  Allergies  Allergen Reactions  . Azithromycin Itching  . Macrodantin Itching  . Minocycline Hcl Itching  . Nitrofurantoin Itching  . Zithromax (Azithromycin Dihydrate) Itching  . Contrast Media (Iodinated Diagnostic Agents) Rash    Patient Measurements: Height: 6' 0.83" (185 cm) (Documented on recent admission of 10/4) Weight: 185 lb 3 oz (84 kg) (Documented on chart of recent admission of 09/08/12) IBW/kg (Calculated) : 79.52  Adjusted Body Weight:   Vital Signs: Temp: 103.7 F (39.8 C) (10/09 1522) Temp src: Rectal (10/09 1522) BP: 164/65 mmHg (10/09 1522) Pulse Rate: 102  (10/09 1522) Intake/Output from previous day:   Intake/Output from this shift:    Labs:  Basename 09/13/12 1525  WBC 8.0  HGB 11.2*  PLT 90*  LABCREA --  CREATININE 0.84   Estimated Creatinine Clearance: 74.9 ml/min (by C-G formula based on Cr of 0.84). No results found for this basename: VANCOTROUGH:2,VANCOPEAK:2,VANCORANDOM:2,GENTTROUGH:2,GENTPEAK:2,GENTRANDOM:2,TOBRATROUGH:2,TOBRAPEAK:2,TOBRARND:2,AMIKACINPEAK:2,AMIKACINTROU:2,AMIKACIN:2, in the last 72 hours   Microbiology: Recent Results (from the past 720 hour(s))  CULTURE, BLOOD (ROUTINE X 2)     Status: Normal (Preliminary result)   Collection Time   09/07/12  7:40 PM      Component Value Range Status Comment   Specimen Description BLOOD LEFT ANTECUBITAL   Final    Special Requests BOTTLES DRAWN AEROBIC AND ANAEROBIC 5CC   Final    Culture  Setup Time 09/08/2012 02:01   Final    Culture     Final    Value:        BLOOD CULTURE RECEIVED NO GROWTH TO DATE CULTURE WILL BE HELD FOR 5 DAYS BEFORE ISSUING A FINAL NEGATIVE REPORT   Report Status PENDING   Incomplete   URINE CULTURE     Status: Normal   Collection Time   09/07/12  7:54 PM      Component Value Range Status Comment   Specimen Description URINE, RANDOM   Final    Special Requests NONE    Final    Culture  Setup Time 09/08/2012 02:31   Final    Colony Count 5,000 COLONIES/ML   Final    Culture INSIGNIFICANT GROWTH   Final    Report Status 09/08/2012 FINAL   Final     Medical History: Past Medical History  Diagnosis Date  . CAD (coronary artery disease)     a. s/p CABG 1995;  b. NSTEMI & subsequent BMS to SVG->right PDA 02/03/12.;  c. Cath 02/14/2012 3vd with 4/4 patent grafts and patent stent in vg->pda.  . Cerebrovascular disease, unspecified   . Unspecified essential hypertension   . Other and unspecified hyperlipidemia   . Type II or unspecified type diabetes mellitus without mention of complication, not stated as uncontrolled   . Osteoarthritis (arthritis due to wear and tear of joints)   . Asthmatic bronchitis   . BPH (benign prostatic hypertrophy)   . Herpes zoster   . Arthritis   . Anemia   . Thrombocytopenia   . Aortic stenosis, moderate     a. Echo 02/14/12 EF 55%, Gr 2 DD, Mod AS (mean grad: 29, peak grad 52)  . Splenomegaly 04/13/2012  . History of blood transfusion 04/13/2012  . 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 Dermatitis 06/07/2012    acnieform rash on back 06/06/12    Medications:  Scheduled:    . sodium chloride  1,000 mL Intravenous Once   Followed by  .  sodium chloride  1,000 mL Intravenous Once  . acetaminophen  650 mg Oral Once  . piperacillin-tazobactam  3.375 g Intravenous Once  . vancomycin  1,000 mg Intravenous Once   Infusions:    . sodium chloride     PRN:  Assessment: 78 yo M who has sepsis. Patient was recently admitted 10/3 for Fever/ cellulitis/infection of Left foot . Was sent home on po Augmentin. Temp today 103.9. Patient was to have received Chemo this week for mantle cell lymphoma stage III.  Goal of Therapy:  Vanc trough= 15-20 Eradication of infection  Plan:  1) Vancomycin 1 Gm IV q 12 hours 2) Zosyn 3.375 Gm IV q 8 hours ( infuse dose over 4 hours) Measure antibiotic drug levels at steady  state Follow up culture results  Loletta Specter 09/13/2012,4:41 PM

## 2012-09-13 NOTE — ED Notes (Signed)
Pt recently admitted for fever and wound infection to left foot. Pt had appt at wound center today and was sent here because wound is worsening.

## 2012-09-13 NOTE — H&P (Addendum)
[name] is an 78 y.o. male.   Chief Complaint: fever HPI:  The patient is an 78 year old Caucasian man with several medical problems who presented to the emergency room from the wound Center with fever.  About one month ago he presented to our office with a blister in the region of the left first metatarsal phalangeal joint.  At the time the blister was superficial and was debrided and treated with doxycycline 100 mg twice daily for 10 days and Dial soap soaks twice daily.  He had a follow-up appointment, but did not keep the appointment because his foot was improving.  However, he subsequently noted increasing erythema of the dorsal aspect of the left foot adjacent to the great toe and presented to the emergency room the temperature 10 11F on September 07, 2012.  He was started on empiric vancomycin and Zosyn and admitted for further evaluation.  X-rays of the left foot did not show evidence of fracture, soft tissue gas, or osteomyelitis changes.  An MRI exam of the left foot did not show evidence of osteomyelitis or abscess.  Chest x-ray showed slight increase in chronic infiltrates in the right lobe.  He did well with IV antibiotics and was switched to Augmentin and discharged to home on October 5.  He did well until today when he developed erythema along the distal left foot adjacent to the great toe along with fever with temperature greater than 102F when seen at the wound center today.  His wounds were debrided somewhat and he is sent to the emergency room for evaluation. At some point his wound had been packed with iodoform gauze.  He has not had.  On drainage, and his review of systems is otherwise significant for a mildly productive cough, no sinus congestion, sore throat, shortness of breath, nausea, vomiting, abdominal pain, diarrhea, constipation, dysuria, or frequency. His medical history is most significant for coronary artery disease status post coronary artery bypass graft surgery, diabetes  mellitus, type II is well controlled and associated with atherosclerotic peripheral vascular disease, mantle cell lymphoma with no recent chemotherapy, and diabetic associated peripheral neuropathy.  Past Medical History  Diagnosis Date  . CAD (coronary artery disease)     a. s/p CABG 1995;  b. NSTEMI & subsequent BMS to SVG->right PDA 02/03/12.;  c. Cath 02/14/2012 3vd with 4/4 patent grafts and patent stent in vg->pda.  . Cerebrovascular disease, unspecified   . Unspecified essential hypertension   . Other and unspecified hyperlipidemia   . Type II or unspecified type diabetes mellitus without mention of complication, not stated as uncontrolled   . Osteoarthritis (arthritis due to wear and tear of joints)   . Asthmatic bronchitis   . BPH (benign prostatic hypertrophy)   . Herpes zoster   . Arthritis   . Anemia   . Thrombocytopenia   . Aortic stenosis, moderate     a. Echo 02/14/12 EF 55%, Gr 2 DD, Mod AS (mean grad: 29, peak grad 52)  . Splenomegaly 04/13/2012  . History of blood transfusion 04/13/2012  . 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 Dermatitis 06/07/2012    acnieform rash on back 06/06/12  . COPD (chronic obstructive pulmonary disease)   . Heart murmur   . Peripheral vascular disease     Medications Prior to Admission  Medication Sig Dispense Refill  . acyclovir (ZOVIRAX) 400 MG tablet Take 400 mg by mouth 2 (two) times daily.      Marland Kitchen aspirin EC  81 MG tablet Take 81 mg by mouth every morning.       . gabapentin (NEURONTIN) 300 MG capsule Take 300-600 mg by mouth 3 (three) times daily. TAKES 2 CAPSULES IN THE MORNING AND 2 AT LUNCH AND 1 AT NIGHT      . isosorbide mononitrate (IMDUR) 30 MG 24 hr tablet Take 1 tablet (30 mg total) by mouth daily.  30 tablet  11  . losartan (COZAAR) 100 MG tablet Take 1 tablet by mouth daily.       . meloxicam (MOBIC) 15 MG tablet Take 15 mg by mouth daily.      . metoprolol succinate (TOPROL-XL) 25 MG 24 hr tablet Take 25  mg by mouth every morning.       . ondansetron (ZOFRAN-ODT) 8 MG disintegrating tablet Place 8 mg under the tongue every 8 (eight) hours as needed. For nausea.      . pravastatin (PRAVACHOL) 40 MG tablet Take 40 mg by mouth daily.      . vitamin B-12 (CYANOCOBALAMIN) 500 MCG tablet Take 500 mcg by mouth daily.        ADDITIONAL HOME MEDICATIONS: acyclovir 400 mg by mouth twice daily, Proventil HFA take 2 puffs by mouth every 6 hours as needed for wheezing, Augmentin 875 mg by mouth twice daily, Ecotrin 81 mg by mouth daily, Lasix 40 mg by mouth daily, gabapentin 300 mg take 2 capsules in the morning, 2 capsules at lunch, and one capsule at bedtime, Hycodan take 5 mL by mouth every 6 hours as needed for cough, isosorbide mononitrate 30 mg by mouth daily, losartan 100 mg by mouth daily, meloxicam 15 mg by mouth daily, Toprol XL 25 mg daily, nitroglycerin 0.4 mg sublingual when necessary chest pain, Zofran ODT 8 mg sublingual every 8 hours as needed for nausea, pravastatin 40 mg by mouth daily, vitamin B12 take 500 g by mouth daily.  PHYSICIANS INVOLVED IN CARE: Dossie Arbour (PCP), Riley Churches (med onc), Juanito Doom (card), Donzetta Starch (derm), Vilinda Boehringer (GI), Joycelyn Schmid (neurology)  Past Surgical History  Procedure Date  . Coronary artery bypass graft 1995  . Rotator cuff repair   . Carpal tunnel release   . Incisional hernia repair   . Kidney surgery   . Back surgery     Family History  Problem Relation Age of Onset  . Coronary artery disease    . Alcohol abuse       Social History:  reports that he quit smoking about 27 years ago. His smoking use included Cigarettes and Pipe. He has a 30 pack-year smoking history. He has never used smokeless tobacco. He reports that he does not drink alcohol or use illicit drugs.  Allergies:  Allergies  Allergen Reactions  . Azithromycin Itching  . Macrodantin Itching  . Minocycline Hcl Itching  . Nitrofurantoin Itching  . Zithromax  (Azithromycin Dihydrate) Itching  . Contrast Media (Iodinated Diagnostic Agents) Rash     ROS: anemia, arthritis, Blood problems, cancer/tumor, diabetes, emphysema, Heart disease and high blood pressure  PHYSICAL EXAM: Blood pressure 105/55, pulse 72, temperature 98.6 F (37 C), temperature source Oral, resp. rate 19, height 6\' 1"  (1.854 m), weight 86.2 kg (190 lb 0.6 oz), SpO2 95.00%. In general, the patient is an elderly white man who looked mildly flushed while lying at 10 elevation of head of bed.  HEENT exam was within normal limits, neck was supple without jugular venous distention or carotid bruit, chest had hyperexpanded lungs with minimal  bibasilar crackles, heart had a regular rate and rhythm with a systolic ejection murmur grade 2/6 in the left sternal border, abdomen had normal bowel sounds and no hepatosplenomegaly or tenderness, extremities were without cyanosis, clubbing, or edema.  There are bilateral barely palpable pedal pulses in both feet have normal warmth.  Overlying the outer aspect of the left first metatarsophalangeal joint is a approximately 1 cm diameter shallow ulcer with central cavitation and that leads to a shallow ulcer of about 5 mm diameter on the plantar aspect of the left first metatarsophalangeal joint as indicated by iodoform packing that traverses the lesions.  There was no gross purulence.  Surrounding the ulcers is a area of faint erythema that extends to the proximal one third of the left foot.  There is no gross fluctuance.  There is slightly decreased light touch sensation in both feet.  He was alert and well oriented with normal affect.  Slightly decreased hearing bilaterally.  He can move all extremities well.  Results for orders placed during the hospital encounter of 09/13/12 (from the past 48 hour(s))  CBC WITH DIFFERENTIAL     Status: Abnormal   Collection Time   09/13/12  3:25 PM      Component Value Range Comment   WBC 8.0  4.0 - 10.5 K/uL    RBC  3.29 (*) 4.22 - 5.81 MIL/uL    Hemoglobin 11.2 (*) 13.0 - 17.0 g/dL    HCT 40.9 (*) 81.1 - 52.0 %    MCV 96.7  78.0 - 100.0 fL    MCH 34.0  26.0 - 34.0 pg    MCHC 35.2  30.0 - 36.0 g/dL    RDW 91.4  78.2 - 95.6 %    Platelets 90 (*) 150 - 400 K/uL    Neutrophils Relative 75  43 - 77 %    Lymphocytes Relative 14  12 - 46 %    Monocytes Relative 9  3 - 12 %    Eosinophils Relative 2  0 - 5 %    Basophils Relative 0  0 - 1 %    Neutro Abs 6.0  1.7 - 7.7 K/uL    Lymphs Abs 1.1  0.7 - 4.0 K/uL    Monocytes Absolute 0.7  0.1 - 1.0 K/uL    Eosinophils Absolute 0.2  0.0 - 0.7 K/uL    Basophils Absolute 0.0  0.0 - 0.1 K/uL    Smear Review PLATELET COUNT CONFIRMED BY SMEAR     COMPREHENSIVE METABOLIC PANEL     Status: Abnormal   Collection Time   09/13/12  3:25 PM      Component Value Range Comment   Sodium 130 (*) 135 - 145 mEq/L    Potassium 4.2  3.5 - 5.1 mEq/L    Chloride 94 (*) 96 - 112 mEq/L    CO2 26  19 - 32 mEq/L    Glucose, Bld 200 (*) 70 - 99 mg/dL    BUN 20  6 - 23 mg/dL    Creatinine, Ser 2.13  0.50 - 1.35 mg/dL    Calcium 9.7  8.4 - 08.6 mg/dL    Total Protein 6.1  6.0 - 8.3 g/dL    Albumin 3.1 (*) 3.5 - 5.2 g/dL    AST 17  0 - 37 U/L    ALT 11  0 - 53 U/L    Alkaline Phosphatase 142 (*) 39 - 117 U/L    Total Bilirubin 0.4  0.3 -  1.2 mg/dL    GFR calc non Af Amer 79 (*) >90 mL/min    GFR calc Af Amer >90  >90 mL/min   PROCALCITONIN     Status: Normal   Collection Time   09/13/12  3:25 PM      Component Value Range Comment   Procalcitonin 0.12     URINALYSIS, ROUTINE W REFLEX MICROSCOPIC     Status: Abnormal   Collection Time   09/13/12  4:05 PM      Component Value Range Comment   Color, Urine YELLOW  YELLOW    APPearance CLEAR  CLEAR    Specific Gravity, Urine 1.020  1.005 - 1.030    pH 5.5  5.0 - 8.0    Glucose, UA NEGATIVE  NEGATIVE mg/dL    Hgb urine dipstick MODERATE (*) NEGATIVE    Bilirubin Urine NEGATIVE  NEGATIVE    Ketones, ur NEGATIVE  NEGATIVE mg/dL     Protein, ur 30 (*) NEGATIVE mg/dL    Urobilinogen, UA 0.2  0.0 - 1.0 mg/dL    Nitrite NEGATIVE  NEGATIVE    Leukocytes, UA NEGATIVE  NEGATIVE   URINE MICROSCOPIC-ADD ON     Status: Normal   Collection Time   09/13/12  4:05 PM      Component Value Range Comment   RBC / HPF 7-10  <3 RBC/hpf    Urine-Other MUCOUS PRESENT     LACTIC ACID, PLASMA     Status: Normal   Collection Time   09/13/12  4:29 PM      Component Value Range Comment   Lactic Acid, Venous 2.0  0.5 - 2.2 mmol/L    No results found.   Assessment/Plan #1 Fever: Most likely from persistent cellulitis in the left foot suboptimally treated with outpatient Augmentin.  We will place patient back on vancomycin with Zosyn for staph aureus coverage as well as improved gram-negative rod coverage given his history of diabetes.  We will also request an orthopedics consultation for the possibility of debridement of this wound.  He does not have evidence of osteomyelitis on recent MRI scan. #2  Diabetic foot ulcer: Apparently not responding well to Augmentin.  There was no evidence of osteomyelitis and recent workup.  We will continue broad-spectrum antibiotics. #3 diabetes mellitus, type II: Blood glucose levels are somewhat elevated and we will start him on sliding scale insulin as needed. #4 COPD: Stable on current medications. #5 coronary artery disease: Again stable on current medications.  We will hold his angiotensin receptor blocker treatment because of somewhat low blood pressures.  Kinze Labo G 09/13/2012, 11:09 PM

## 2012-09-13 NOTE — ED Notes (Signed)
MD at bedside. 

## 2012-09-13 NOTE — ED Notes (Signed)
Bed:WA06<BR> Expected date:<BR> Expected time:<BR> Means of arrival:<BR> Comments:<BR> seizure

## 2012-09-13 NOTE — ED Notes (Signed)
Pt transported to 1521.

## 2012-09-13 NOTE — ED Notes (Signed)
Report given to stacy, rn on floor 

## 2012-09-13 NOTE — ED Provider Notes (Signed)
History     CSN: [id number]  Arrival date & time 09/13/12  1434   First MD Initiated Contact with Patient 09/13/12 1519      Chief Complaint  Patient presents with  . Recurrent Skin Infections     HPI  The patient presents from wound clinic where he was being seen for a left foot wound.  The patient was discharged several days ago following a presentation for fever, and worsening pain in the left foot.  An admission diagnosis was cellulitis.  During a admission the patient on IV antibiotics, MRI.  He states that since discharge she has been generally unwell, with persistent pain in the left foot, fever.  Today he denies any confusion, disorientation, chest pain, dyspnea, nausea, vomiting.      Past Medical History  Diagnosis Date  . CAD (coronary artery disease)     a. s/p CABG 1995;  b. NSTEMI & subsequent BMS to SVG->right PDA 02/03/12.;  c. Cath 02/14/2012 3vd with 4/4 patent grafts and patent stent in vg->pda.  . Cerebrovascular disease, unspecified   . Unspecified essential hypertension   . Other and unspecified hyperlipidemia   . Type II or unspecified type diabetes mellitus without mention of complication, not stated as uncontrolled   . Osteoarthritis (arthritis due to wear and tear of joints)   . Asthmatic bronchitis   . BPH (benign prostatic hypertrophy)   . Herpes zoster   . Arthritis   . Anemia   . Thrombocytopenia   . Aortic stenosis, moderate     a. Echo 02/14/12 EF 55%, Gr 2 DD, Mod AS (mean grad: 29, peak grad 52)  . Splenomegaly 04/13/2012  . History of blood transfusion 04/13/2012  . 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 Dermatitis 06/07/2012    acnieform rash on back 06/06/12    Past Surgical History  Procedure Date  . Coronary artery bypass graft 1995  . Rotator cuff repair   . Carpal tunnel release   . Incisional hernia repair   . Kidney surgery   . Back surgery     Family History  Problem Relation Age of Onset  . Coronary  artery disease    . Alcohol abuse      History  Substance Use Topics  . Smoking status: Former Smoker -- 1.0 packs/day for 30 years    Types: Cigarettes, Pipe    Quit date: 03/29/1985  . Smokeless tobacco: Never Used   Comment: quit in 1986  . Alcohol Use: No      Review of Systems  Constitutional: Positive for fever, chills and fatigue.  HENT: Positive for sore throat. Negative for congestion, rhinorrhea, neck pain and neck stiffness.   Eyes: Negative for pain.  Respiratory: Negative for cough and shortness of breath.   Cardiovascular: Negative for chest pain, palpitations and leg swelling.  Gastrointestinal: Negative for nausea, vomiting, abdominal pain and constipation.  Genitourinary: Negative for dysuria, hematuria and flank pain.  Musculoskeletal: Positive for myalgias. Negative for back pain.  Skin: Positive for color change and wound.  Neurological: Negative for dizziness, weakness, light-headedness, numbness and headaches.    Allergies  Azithromycin; Macrodantin; Minocycline hcl; Nitrofurantoin; Zithromax; and Contrast media  Home Medications   Current Outpatient Rx  Name Route Sig Dispense Refill  . ACYCLOVIR 400 MG PO TABS Oral Take 400 mg by mouth 2 (two) times daily.    . AMOXICILLIN-POT CLAVULANATE 875-125 MG PO TABS Oral Take 1 tablet by mouth 2 (two) times  daily. 24 tablet 0  . ASPIRIN EC 81 MG PO TBEC Oral Take 81 mg by mouth every morning.     . FUROSEMIDE 40 MG PO TABS Oral Take 1 tablet (40 mg total) by mouth daily. Fluid 90 tablet 0  . GABAPENTIN 300 MG PO CAPS Oral Take 300-600 mg by mouth 3 (three) times daily. TAKES 2 CAPSULES IN THE MORNING AND 2 AT LUNCH AND 1 AT NIGHT    . ISOSORBIDE MONONITRATE ER 30 MG PO TB24 Oral Take 1 tablet (30 mg total) by mouth daily. 30 tablet 11  . LOSARTAN POTASSIUM 100 MG PO TABS Oral Take 1 tablet by mouth daily.     . MELOXICAM 15 MG PO TABS Oral Take 15 mg by mouth daily.    Marland Kitchen METOPROLOL SUCCINATE ER 25 MG PO TB24  Oral Take 25 mg by mouth every morning.     Marland Kitchen ONDANSETRON 8 MG PO TBDP Sublingual Place 8 mg under the tongue every 8 (eight) hours as needed. For nausea.    Marland Kitchen PRAVASTATIN SODIUM 40 MG PO TABS Oral Take 40 mg by mouth daily.    Marland Kitchen VITAMIN B-12 500 MCG PO TABS Oral Take 500 mcg by mouth daily.      BP 164/65  Pulse 102  Temp 103.7 F (39.8 C) (Rectal)  Resp 30  SpO2 99%  Physical Exam  Nursing note and vitals reviewed. Constitutional: He is oriented to person, place, and time. He appears well-developed and well-nourished. No distress.  HENT:  Head: Normocephalic and atraumatic.    Mouth/Throat: Oropharynx is clear and moist. No oropharyngeal exudate.  Eyes: EOM are normal. Pupils are equal, round, and reactive to light.  Neck: Normal range of motion. Neck supple.       No nuchal rigidity. Surgical wound on L posterior neck with intact sutures and no evidence of swelling, redness or drainage  Cardiovascular: Normal rate and regular rhythm.   Murmur heard. Pulmonary/Chest: Effort normal and breath sounds normal. No respiratory distress. He has no wheezes. He has no rales. He exhibits no tenderness.  Abdominal: Soft. Bowel sounds are normal. He exhibits no distension and no mass. There is no tenderness. There is no rebound and no guarding.  Musculoskeletal: Normal range of motion. He exhibits no edema and no tenderness.       L great toe w spreading erythema, drains placed into two ulcers (one anterior, one inferior). Pulses are palpable.  Neurological: He is alert and oriented to person, place, and time.       5/5 motor in all ext, sensation intact  Skin: Skin is warm and dry. No rash noted. There is erythema.       Ulceration to bottom of L foot with surrounding erythema tracking up to L ankle. No L inguinal lymphadenopathy  Psychiatric: He has a normal mood and affect. His behavior is normal.    ED Course  Procedures (including critical care time)  Labs Reviewed  CBC WITH  DIFFERENTIAL - Abnormal; Notable for the following:    RBC 3.29 (*)     Hemoglobin 11.2 (*)     HCT 31.8 (*)     Platelets 90 (*)     All other components within normal limits  CULTURE, BLOOD (ROUTINE X 2)  CULTURE, BLOOD (ROUTINE X 2)  COMPREHENSIVE METABOLIC PANEL  LACTIC ACID, PLASMA  PROCALCITONIN  URINALYSIS, ROUTINE W REFLEX MICROSCOPIC   No results found.   No diagnosis found.  HR 101st, abnormal  O2:  99%ra, normal   Date: 09/13/2012  Rate: 99  Rhythm: normal sinus rhythm  QRS Axis: left  Intervals: normal  ST/T Wave abnormalities: nonspecific T wave changes  Conduction Disutrbances:nonspecific intraventricular conduction delay  Narrative Interpretation:   Old EKG Reviewed: minimal changes ABNORMAL  UPDATE: Patient significantly less tachycardic - HR mid 80's  I d/w Dr. Jarold Motto the case.  He will admit the patient.  MDM  This elderly male with a recent admission for cellulitis now presents febrile, tachycardic, with a foot wound consistent with cellulitis, and newly debrided lesions.  The patient also has lymphoma.  Given his presentation arrhythmia suspicion of sepsis, and he was empirically given antibiotics, fluids.  The patient had a clinical and if subjective improvement.  No fevers no leukocytosis or lactic acidosis, given his age, comorbidities, vital sign abnormalities, he'll be admitted for further evaluation and management.       Gerhard Munch, MD 09/13/12 [phone]

## 2012-09-14 ENCOUNTER — Inpatient Hospital Stay (HOSPITAL_COMMUNITY): Payer: Medicare Other

## 2012-09-14 ENCOUNTER — Telehealth: Payer: Self-pay | Admitting: Oncology

## 2012-09-14 ENCOUNTER — Other Ambulatory Visit: Payer: Self-pay | Admitting: *Deleted

## 2012-09-14 ENCOUNTER — Telehealth: Payer: Self-pay | Admitting: *Deleted

## 2012-09-14 DIAGNOSIS — L539 Erythematous condition, unspecified: Secondary | ICD-10-CM

## 2012-09-14 LAB — COMPREHENSIVE METABOLIC PANEL
ALT: 9 U/L (ref 0–53)
AST: 14 U/L (ref 0–37)
Alkaline Phosphatase: 118 U/L — ABNORMAL HIGH (ref 39–117)
CO2: 25 mEq/L (ref 19–32)
Calcium: 9.3 mg/dL (ref 8.4–10.5)
Chloride: 103 mEq/L (ref 96–112)
GFR calc Af Amer: 89 mL/min — ABNORMAL LOW (ref >90)
GFR calc non Af Amer: 77 mL/min — ABNORMAL LOW (ref >90)
Glucose, Bld: 122 mg/dL — ABNORMAL HIGH (ref 70–99)
Potassium: 3.5 mEq/L (ref 3.5–5.1)
Sodium: 137 mEq/L (ref 135–145)

## 2012-09-14 LAB — CBC
Hemoglobin: 9.5 g/dL — ABNORMAL LOW (ref 13.0–17.0)
MCH: 32.9 pg (ref 26.0–34.0)
MCHC: 33.9 g/dL (ref 30.0–36.0)
RDW: 14.6 % (ref 11.5–15.5)

## 2012-09-14 LAB — CULTURE, BLOOD (ROUTINE X 2)

## 2012-09-14 LAB — HEMOGLOBIN A1C: Hgb A1c MFr Bld: 6.6 % — ABNORMAL HIGH (ref <5.7)

## 2012-09-14 LAB — GLUCOSE, CAPILLARY: Glucose-Capillary: 161 mg/dL — ABNORMAL HIGH (ref 70–99)

## 2012-09-14 MED ORDER — DIPHENHYDRAMINE HCL 25 MG PO CAPS
25.0000 mg | ORAL_CAPSULE | Freq: Three times a day (TID) | ORAL | Status: DC | PRN
  Administered 2012-09-14 – 2012-09-18 (×9): 25 mg via ORAL
  Filled 2012-09-14 (×10): qty 1

## 2012-09-14 MED ORDER — INSULIN GLARGINE 100 UNIT/ML ~~LOC~~ SOLN
10.0000 [IU] | Freq: Every day | SUBCUTANEOUS | Status: DC
  Administered 2012-09-14 – 2012-09-18 (×5): 10 [IU] via SUBCUTANEOUS

## 2012-09-14 MED ORDER — INSULIN ASPART 100 UNIT/ML ~~LOC~~ SOLN
0.0000 [IU] | Freq: Three times a day (TID) | SUBCUTANEOUS | Status: DC
  Administered 2012-09-14 – 2012-09-16 (×6): 2 [IU] via SUBCUTANEOUS

## 2012-09-14 MED ORDER — INSULIN ASPART 100 UNIT/ML ~~LOC~~ SOLN: 0.0000 [IU] | Freq: Every day | SUBCUTANEOUS | Status: DC

## 2012-09-14 NOTE — Telephone Encounter (Signed)
Per staff phone message and POF I have scheduled appts. JMW  

## 2012-09-14 NOTE — Telephone Encounter (Signed)
Called [name] to reschedule chemo for 10/14 and 09/19/12

## 2012-09-14 NOTE — Consult Note (Signed)
Reason for Consult:  Left foot wound Referring Physician: Dr. Letitia Neri is an 78 y.o. male.  HPI: 78 y/o male with PMH of diabetes c/b peripheral neuropathy and PAD is admitted currently for left foot cellulitis.  He mowed his yard in [place] back in August and got a blister on the medial aspect of his foot over the 1st mt head.  He has not been able to heal this area and was admitted a week ago with cellulitis.  He responded to IV abx and was sent home on oral augmentin.  He went to the wound center and saw Dr. Jimmey Ralph earlier this week and was referred to the ER for recurrent cellulitis.  He denies f/c/n/v/wt loss.  He is currently being treated with chemo for non-hodgkins lymphoma.  He had never had any amputations or surgery to either foot or ankle.  He is not a smoker.  Past Medical History  Diagnosis Date  . CAD (coronary artery disease)     a. s/p CABG 1995;  b. NSTEMI & subsequent BMS to SVG->right PDA 02/03/12.;  c. Cath 02/14/2012 3vd with 4/4 patent grafts and patent stent in vg->pda.  . Cerebrovascular disease, unspecified   . Unspecified essential hypertension   . Other and unspecified hyperlipidemia   . Type II or unspecified type diabetes mellitus without mention of complication, not stated as uncontrolled   . Osteoarthritis (arthritis due to wear and tear of joints)   . Asthmatic bronchitis   . BPH (benign prostatic hypertrophy)   . Herpes zoster   . Arthritis   . Anemia   . Thrombocytopenia   . Aortic stenosis, moderate     a. Echo 02/14/12 EF 55%, Gr 2 DD, Mod AS (mean grad: 29, peak grad 52)  . Splenomegaly 04/13/2012  . History of blood transfusion 04/13/2012  . 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 Dermatitis 06/07/2012    acnieform rash on back 06/06/12  . COPD (chronic obstructive pulmonary disease)   . Heart murmur   . Peripheral vascular disease     Past Surgical History  Procedure Date  . Coronary artery bypass graft 1995  .  Rotator cuff repair   . Carpal tunnel release   . Incisional hernia repair   . Kidney surgery   . Back surgery     Family History  Problem Relation Age of Onset  . Coronary artery disease    . Alcohol abuse      Social History:  reports that he quit smoking about 27 years ago. His smoking use included Cigarettes and Pipe. He has a 30 pack-year smoking history. He has never used smokeless tobacco. He reports that he does not drink alcohol or use illicit drugs.  Allergies:  Allergies  Allergen Reactions  . Azithromycin Itching  . Macrodantin Itching  . Minocycline Hcl Itching  . Nitrofurantoin Itching  . Zithromax (Azithromycin Dihydrate) Itching  . Contrast Media (Iodinated Diagnostic Agents) Rash    Medications: I have reviewed the patient's current medications.  Results for orders placed during the hospital encounter of 09/13/12 (from the past 48 hour(s))  CBC WITH DIFFERENTIAL     Status: Abnormal   Collection Time   09/13/12  3:25 PM      Component Value Range Comment   WBC 8.0  4.0 - 10.5 K/uL    RBC 3.29 (*) 4.22 - 5.81 MIL/uL    Hemoglobin 11.2 (*) 13.0 - 17.0 g/dL    HCT  31.8 (*) 39.0 - 52.0 %    MCV 96.7  78.0 - 100.0 fL    MCH 34.0  26.0 - 34.0 pg    MCHC 35.2  30.0 - 36.0 g/dL    RDW 40.9  81.1 - 91.4 %    Platelets 90 (*) 150 - 400 K/uL    Neutrophils Relative 75  43 - 77 %    Lymphocytes Relative 14  12 - 46 %    Monocytes Relative 9  3 - 12 %    Eosinophils Relative 2  0 - 5 %    Basophils Relative 0  0 - 1 %    Neutro Abs 6.0  1.7 - 7.7 K/uL    Lymphs Abs 1.1  0.7 - 4.0 K/uL    Monocytes Absolute 0.7  0.1 - 1.0 K/uL    Eosinophils Absolute 0.2  0.0 - 0.7 K/uL    Basophils Absolute 0.0  0.0 - 0.1 K/uL    Smear Review PLATELET COUNT CONFIRMED BY SMEAR     COMPREHENSIVE METABOLIC PANEL     Status: Abnormal   Collection Time   09/13/12  3:25 PM      Component Value Range Comment   Sodium 130 (*) 135 - 145 mEq/L    Potassium 4.2  3.5 - 5.1 mEq/L     Chloride 94 (*) 96 - 112 mEq/L    CO2 26  19 - 32 mEq/L    Glucose, Bld 200 (*) 70 - 99 mg/dL    BUN 20  6 - 23 mg/dL    Creatinine, Ser 7.82  0.50 - 1.35 mg/dL    Calcium 9.7  8.4 - 95.6 mg/dL    Total Protein 6.1  6.0 - 8.3 g/dL    Albumin 3.1 (*) 3.5 - 5.2 g/dL    AST 17  0 - 37 U/L    ALT 11  0 - 53 U/L    Alkaline Phosphatase 142 (*) 39 - 117 U/L    Total Bilirubin 0.4  0.3 - 1.2 mg/dL    GFR calc non Af Amer 79 (*) >90 mL/min    GFR calc Af Amer >90  >90 mL/min   PROCALCITONIN     Status: Normal   Collection Time   09/13/12  3:25 PM      Component Value Range Comment   Procalcitonin 0.12     CULTURE, BLOOD (ROUTINE X 2)     Status: Normal (Preliminary result)   Collection Time   09/13/12  3:40 PM      Component Value Range Comment   Specimen Description BLOOD RIGHT ARM      Special Requests BOTTLES DRAWN AEROBIC AND ANAEROBIC 5CC      Culture  Setup Time 09/13/2012 21:13      Culture        Value:        BLOOD CULTURE RECEIVED NO GROWTH TO DATE CULTURE WILL BE HELD FOR 5 DAYS BEFORE ISSUING A FINAL NEGATIVE REPORT   Report Status PENDING     URINALYSIS, ROUTINE W REFLEX MICROSCOPIC     Status: Abnormal   Collection Time   09/13/12  4:05 PM      Component Value Range Comment   Color, Urine YELLOW  YELLOW    APPearance CLEAR  CLEAR    Specific Gravity, Urine 1.020  1.005 - 1.030    pH 5.5  5.0 - 8.0    Glucose, UA NEGATIVE  NEGATIVE mg/dL  Hgb urine dipstick MODERATE (*) NEGATIVE    Bilirubin Urine NEGATIVE  NEGATIVE    Ketones, ur NEGATIVE  NEGATIVE mg/dL    Protein, ur 30 (*) NEGATIVE mg/dL    Urobilinogen, UA 0.2  0.0 - 1.0 mg/dL    Nitrite NEGATIVE  NEGATIVE    Leukocytes, UA NEGATIVE  NEGATIVE   URINE MICROSCOPIC-ADD ON     Status: Normal   Collection Time   09/13/12  4:05 PM      Component Value Range Comment   RBC / HPF 7-10  <3 RBC/hpf    Urine-Other MUCOUS PRESENT     LACTIC ACID, PLASMA     Status: Normal   Collection Time   09/13/12  4:29 PM       Component Value Range Comment   Lactic Acid, Venous 2.0  0.5 - 2.2 mmol/L   CULTURE, BLOOD (ROUTINE X 2)     Status: Normal (Preliminary result)   Collection Time   09/13/12  5:05 PM      Component Value Range Comment   Specimen Description BLOOD RIGHT HAND      Special Requests BOTTLES DRAWN AEROBIC AND ANAEROBIC 5CC      Culture  Setup Time 09/13/2012 21:13      Culture        Value:        BLOOD CULTURE RECEIVED NO GROWTH TO DATE CULTURE WILL BE HELD FOR 5 DAYS BEFORE ISSUING A FINAL NEGATIVE REPORT   Report Status PENDING     GLUCOSE, CAPILLARY     Status: Abnormal   Collection Time   09/13/12 11:43 PM      Component Value Range Comment   Glucose-Capillary 272 (*) 70 - 99 mg/dL    Comment 1 Documented in Chart      Comment 2 Notify RN     COMPREHENSIVE METABOLIC PANEL     Status: Abnormal   Collection Time   09/14/12  4:40 AM      Component Value Range Comment   Sodium 137  135 - 145 mEq/L    Potassium 3.5  3.5 - 5.1 mEq/L    Chloride 103  96 - 112 mEq/L    CO2 25  19 - 32 mEq/L    Glucose, Bld 122 (*) 70 - 99 mg/dL    BUN 18  6 - 23 mg/dL    Creatinine, Ser 1.61  0.50 - 1.35 mg/dL    Calcium 9.3  8.4 - 09.6 mg/dL    Total Protein 5.0 (*) 6.0 - 8.3 g/dL    Albumin 2.5 (*) 3.5 - 5.2 g/dL    AST 14  0 - 37 U/L    ALT 9  0 - 53 U/L    Alkaline Phosphatase 118 (*) 39 - 117 U/L    Total Bilirubin 0.3  0.3 - 1.2 mg/dL    GFR calc non Af Amer 77 (*) >90 mL/min    GFR calc Af Amer 89 (*) >90 mL/min   CBC     Status: Abnormal   Collection Time   09/14/12  4:40 AM      Component Value Range Comment   WBC 4.1  4.0 - 10.5 K/uL    RBC 2.89 (*) 4.22 - 5.81 MIL/uL    Hemoglobin 9.5 (*) 13.0 - 17.0 g/dL    HCT 04.5 (*) 40.9 - 52.0 %    MCV 96.9  78.0 - 100.0 fL    MCH 32.9  26.0 - 34.0 pg  MCHC 33.9  30.0 - 36.0 g/dL    RDW 16.1  09.6 - 04.5 %    Platelets 84 (*) 150 - 400 K/uL CONSISTENT WITH PREVIOUS RESULT  HEMOGLOBIN A1C     Status: Abnormal   Collection Time   09/14/12   4:40 AM      Component Value Range Comment   Hemoglobin A1C 6.6 (*) <5.7 %    Mean Plasma Glucose 143 (*) <117 mg/dL   GLUCOSE, CAPILLARY     Status: Abnormal   Collection Time   09/14/12 11:44 AM      Component Value Range Comment   Glucose-Capillary 150 (*) 70 - 99 mg/dL    Comment 1 Notify RN       No results found.  ROS:  As above PE:  Blood pressure 135/58, pulse 57, temperature 97.4 F (36.3 C), temperature source Oral, resp. rate 15, height 6\' 1"  (1.854 m), weight 86.2 kg (190 lb 0.6 oz), SpO2 95.00%. wn wd male in nad.  A and O x 4.  Mood and affect normal.  EOMI.  Respirations unlabored.  R foot without gross deformity.  Skin is intact except for two ulcers.  The first is over the met head at the medial forefoot.  The second is plantar over the medial sesamoid.  Both have fibrinous exudate and necrotic tissue evident.  The plantar wound probes to the level of the sesamoid which I believe is exposed.  The medial wound appears to have exposed joint capsule but no bone is palpable.  DP and PT pulses are not palpable but are triphasic on doppler.  Toes have brisk cap refill.  Cellulitis at the dorsal foot is resolving.  5/5 strength in PF adn DF at the ankle.  Sens to LT is diminished to the level of the ankle.   Assessment/Plan: Right foot nonhealing diabetic ulcers - to OR Sat for I and D of wounds and likely application of a wound VAC.  He may need a repeat I and D next week based on what the wound looks like.  He understands that if there is significant necrotic tissue deep to the ulcers that it may be necessary to amputate the hallux.  I'll continue to follow.  [name] 09/14/2012, 12:59 PM

## 2012-09-14 NOTE — Progress Notes (Signed)
*  PRELIMINARY RESULTS* Vascular Ultrasound Bilateral lower extremity venous duplex has been completed.  Preliminary findings: Right= no evidence of Acute DVT. Chronic DVT is noted in the mid femoral vein. Left= no evidence of Acute DVT. Chronic DVT is noted in the mid femoral vein and the popliteal vein.  Farrel Demark, RDMS, RVT 09/14/2012, 9:30 AM

## 2012-09-14 NOTE — Progress Notes (Signed)
Subjective: Feels better today, slept well, no left foot pain or dyspnea.  Objective: Vital signs in last 24 hours: Temp:  [97.6 F (36.4 C)-103.8 F (39.9 C)] 97.6 F (36.4 C) (10/10 0609) Pulse Rate:  [51-102] 51  (10/10 0538) Resp:  [18-30] 18  (10/10 0538) BP: (102-164)/(45-65) 127/63 mmHg (10/10 0538) SpO2:  [95 %-99 %] 96 % (10/10 0538) Weight:  [84 kg (185 lb 3 oz)-86.2 kg (190 lb 0.6 oz)] 86.2 kg (190 lb 0.6 oz) (10/09 2034) Weight change:    Intake/Output from previous day: 10/09 0701 - 10/10 0700 In: 830 [I.V.:780; IV Piggyback:50] Out: 1375 [Urine:1375]   General appearance: alert, cooperative and no distress Resp: clear to auscultation bilaterally Cardio: regular rate and rhythm and with a 2/6 SEM GI: soft, non-tender; bowel sounds normal; no masses,  no organomegaly Extremities: less prominent erythema over distal medial dorsum of left foot  Lab Results:  Basename 09/14/12 0440 09/13/12 1525  WBC 4.1 8.0  HGB 9.5* 11.2*  HCT 28.0* 31.8*  PLT 84* 90*   BMET  Basename 09/14/12 0440 09/13/12 1525  NA 137 130*  K 3.5 4.2  CL 103 94*  CO2 25 26  GLUCOSE 122* 200*  BUN 18 20  CREATININE 0.90 0.84  CALCIUM 9.3 9.7   CMET CMP     Component Value Date/Time   NA 137 09/14/2012 0440   NA 138 08/21/2012 1308   K 3.5 09/14/2012 0440   K 3.3* 08/21/2012 1308   CL 103 09/14/2012 0440   CL 104 08/21/2012 1308   CO2 25 09/14/2012 0440   CO2 25 08/21/2012 1308   GLUCOSE 122* 09/14/2012 0440   GLUCOSE 182* 08/21/2012 1308   BUN 18 09/14/2012 0440   BUN 18.0 08/21/2012 1308   CREATININE 0.90 09/14/2012 0440   CREATININE 1.0 08/21/2012 1308   CALCIUM 9.3 09/14/2012 0440   CALCIUM 9.3 08/21/2012 1308   PROT 5.0* 09/14/2012 0440   PROT 5.7* 08/21/2012 1308   ALBUMIN 2.5* 09/14/2012 0440   ALBUMIN 3.5 08/21/2012 1308   AST 14 09/14/2012 0440   AST 16 08/21/2012 1308   ALT 9 09/14/2012 0440   ALT 12 08/21/2012 1308   ALKPHOS 118* 09/14/2012 0440   ALKPHOS 117  08/21/2012 1308   BILITOT 0.3 09/14/2012 0440   BILITOT 0.60 08/21/2012 1308   GFRNONAA 77* 09/14/2012 0440   GFRAA 89* 09/14/2012 0440    CBG (last 3)   Basename 09/13/12 2343  GLUCAP 272*    INR RESULTS:   Lab Results  Component Value Date   INR 1.09 09/08/2012   INR 1.05 03/29/2012   INR 1.13 02/14/2012     Studies/Results: No results found.  Medications: I have reviewed the patient's current medications.  Assessment/Plan: #1 Sepsis Syndrome: improved on Zosyn and Vancomycin. He had an MRI of left foot last week that did not show evidence of abscess or osteomyelitis. Will check X-rays of foot, arterial US evaluation, consider repeat MRI, and obtain orthopedics consult today. #2 DM2: sugars a bit high, so will increase Rx #3 CAD: stable on current medications  LOS: 1 day   Derwin Reddy G 09/14/2012, 7:14 AM

## 2012-09-15 LAB — GLUCOSE, CAPILLARY
Glucose-Capillary: 138 mg/dL — ABNORMAL HIGH (ref 70–99)
Glucose-Capillary: 144 mg/dL — ABNORMAL HIGH (ref 70–99)
Glucose-Capillary: 105 mg/dL — ABNORMAL HIGH (ref 70–99)

## 2012-09-15 MED ORDER — SODIUM CHLORIDE 0.9 % IV SOLN
INTRAVENOUS | Status: DC
Start: 2012-09-16 — End: 2012-09-19
  Administered 2012-09-16 – 2012-09-17 (×3): via INTRAVENOUS
  Administered 2012-09-18: 10 mL/h via INTRAVENOUS

## 2012-09-15 MED ORDER — CHLORHEXIDINE GLUCONATE 4 % EX LIQD
60.0000 mL | Freq: Once | CUTANEOUS | Status: AC
  Administered 2012-09-16: 4 via TOPICAL
  Filled 2012-09-15: qty 60

## 2012-09-15 MED ORDER — ACETAMINOPHEN 325 MG PO TABS: 650.0000 mg | ORAL_TABLET | Freq: Four times a day (QID) | ORAL | Status: DC | PRN

## 2012-09-15 MED ORDER — ACETAMINOPHEN 650 MG RE SUPP: 650.0000 mg | Freq: Four times a day (QID) | RECTAL | Status: DC | PRN

## 2012-09-15 NOTE — Progress Notes (Signed)
Subjective: Pt denies any pain in his left foot.  No n/f/c/v.  On vanc / zosyn.  Objective: Vital signs in last 24 hours: Temp:  [98.7 F (37.1 C)-99 F (37.2 C)] 99 F (37.2 C) (10/11 0538) Pulse Rate:  [70-79] 79  (10/11 0538) Resp:  [18] 18  (10/11 0538) BP: (165-176)/(63-78) 165/73 mmHg (10/11 0653) SpO2:  [96 %] 96 % 09-18-23 0538)  Intake/Output from previous day: 10/10 0701 - 10/11 0700 In: 3146.7 [I.V.:2396.7; IV Piggyback:750] Out: 700 [Urine:700] Intake/Output this shift:     Basename 09/14/12 0440 09/13/12 1525  HGB 9.5* 11.2*    Basename 09/14/12 0440 09/13/12 1525  WBC 4.1 8.0  RBC 2.89* 3.29*  HCT 28.0* 31.8*  PLT 84* 90*    Basename 09/14/12 0440 09/13/12 1525  NA 137 130*  K 3.5 4.2  CL 103 94*  CO2 25 26  BUN 18 20  CREATININE 0.90 0.84  GLUCOSE 122* 200*  CALCIUM 9.3 9.7   No results found for this basename: LABPT:2,INR:2 in the last 72 hours  left forefoot wounds dressed and dry.  Skin o/w healthy.  5/5 strength in PF and DF of ankle.  Assessment/Plan: L forefoot diabetic ulcers - to OR for I and D v. 1st ray amputation.  The risks and benefits of the alternative treatment options have been discussed in detail.  The patient wishes to proceed with surgery and specifically understands risks of bleeding, infection, nerve damage, blood clots, need for additional surgery, amputation and death.    [name] 09-17-2012, 12:04 PM

## 2012-09-15 NOTE — Progress Notes (Signed)
Subjective: Slept poorly last night, had some fevers, no cough, dyspnea, abdominal pain, or diarrhea.  Objective: Vital signs in last 24 hours: Temp:  [97.4 F (36.3 C)-99 F (37.2 C)] 99 F (37.2 C) (10/11 0538) Pulse Rate:  [57-79] 79  (10/11 0538) Resp:  [15-18] 18  (10/11 0538) BP: (135-176)/(58-78) 165/73 mmHg (10/11 0653) SpO2:  [95 %-96 %] 96 % (10/11 0538) Weight change:    Intake/Output from previous day: 10/10 0701 - 10/11 0700 In: 3146.7 [I.V.:2396.7; IV Piggyback:750] Out: 700 [Urine:700]   General appearance: alert, cooperative and no distress Resp: clear to auscultation bilaterally Cardio: regular rate and rhythm and with 2/6 SEM GI: soft, non-tender; bowel sounds normal; no masses,  no organomegaly Extremities: extremities normal, atraumatic, no cyanosis or edema and left foot in gauze wrap per ortho  Lab Results:  Basename 09/14/12 0440 09/13/12 1525  WBC 4.1 8.0  HGB 9.5* 11.2*  HCT 28.0* 31.8*  PLT 84* 90*   BMET  Basename 09/14/12 0440 09/13/12 1525  NA 137 130*  K 3.5 4.2  CL 103 94*  CO2 25 26  GLUCOSE 122* 200*  BUN 18 20  CREATININE 0.90 0.84  CALCIUM 9.3 9.7   CMET CMP     Component Value Date/Time   NA 137 09/14/2012 0440   NA 138 08/21/2012 1308   K 3.5 09/14/2012 0440   K 3.3* 08/21/2012 1308   CL 103 09/14/2012 0440   CL 104 08/21/2012 1308   CO2 25 09/14/2012 0440   CO2 25 08/21/2012 1308   GLUCOSE 122* 09/14/2012 0440   GLUCOSE 182* 08/21/2012 1308   BUN 18 09/14/2012 0440   BUN 18.0 08/21/2012 1308   CREATININE 0.90 09/14/2012 0440   CREATININE 1.0 08/21/2012 1308   CALCIUM 9.3 09/14/2012 0440   CALCIUM 9.3 08/21/2012 1308   PROT 5.0* 09/14/2012 0440   PROT 5.7* 08/21/2012 1308   ALBUMIN 2.5* 09/14/2012 0440   ALBUMIN 3.5 08/21/2012 1308   AST 14 09/14/2012 0440   AST 16 08/21/2012 1308   ALT 9 09/14/2012 0440   ALT 12 08/21/2012 1308   ALKPHOS 118* 09/14/2012 0440   ALKPHOS 117 08/21/2012 1308   BILITOT 0.3 09/14/2012  0440   BILITOT 0.60 08/21/2012 1308   GFRNONAA 77* 09/14/2012 0440   GFRAA 89* 09/14/2012 0440    CBG (last 3)   Basename 09/14/12 2131 09/14/12 1731 09/14/12 1144  GLUCAP 161* 134* 150*    INR RESULTS:   Lab Results  Component Value Date   INR 1.09 09/08/2012   INR 1.05 03/29/2012   INR 1.13 02/14/2012     Studies/Results: Dg Foot 2 Views Left  09/14/2012  *RADIOLOGY REPORT*  Clinical Data: Ulcer over first MTP joint, evaluate for osteomyelitis  LEFT FOOT - 2 VIEW  Comparison: Left foot MRI dated 09/08/2012  Findings: Soft tissue ulcer along the medial aspect of the first MTP joint.  No underlying cortical irregularity.  No fracture or dislocation is seen.  Vascular calcifications.  IMPRESSION: No radiographic findings to suggest osteomyelitis, noting that the recent MRI was much more sensitive.   Original Report Authenticated By: Charline Bills, M.D.     Medications: I have reviewed the patient's current medications.  Assessment/Plan: #1 Sepsis syndrome: improved although still with low grade fevers. Appreciate management of foot infection by Dr. Victorino Dike. Will continue zosyn and vanc for now. #2 DM2: stable on current meds.  #3 CAD: stable with no angina  LOS: 2 days   Bee Hammerschmidt G  09/15/2012, 7:38 AM

## 2012-09-15 NOTE — Progress Notes (Signed)
12 lead EKG performed.  Results called into Dr. Wylene Simmer.  No further interventions at this time.  Will continue to monitor.

## 2012-09-15 NOTE — Progress Notes (Signed)
Patient for surgery in am.  As per OR, they will send for patient at 630am.  Patient's family aware.  RN released orders for chest xray, ekg and consent.  Consent and ekg obtained.  RN spoke to Dr. Leta Jungling regarding chest xray.  Patient does not have a history of chf and has a chest xray in the computer from 10/03.  As per Dr. Leta Jungling, anestesia, if patient does not have a history of chf, then they usually do not require a chest xray.  Will continue to monitor.

## 2012-09-15 NOTE — Progress Notes (Signed)
Received in report that patient's telemetry strip shows NSR.  However, patient has an irregular rythym.  Patient is asymptomatic, no chest pain no shortness of breath.  Placed call to Dr. Eloise Harman, and left message with D.J.  As per D.J., Dr. Eloise Harman was in a patient room, if he doesn't call back he is not concerned about the heart rythym.  RN showed tele strip to Dr. Izola Price.  As per Dr. Izola Price, the strip just showed some PVC's.  Patient states, "I feel great!"  Will continue to monitor patient.

## 2012-09-16 ENCOUNTER — Encounter (HOSPITAL_COMMUNITY)
Admission: EM | Disposition: A | Discharge status: Home Health | Payer: Self-pay | Source: Home / Self Care | Attending: Internal Medicine

## 2012-09-16 ENCOUNTER — Inpatient Hospital Stay (HOSPITAL_COMMUNITY): Payer: Medicare Other | Admitting: Registered Nurse

## 2012-09-16 ENCOUNTER — Encounter (HOSPITAL_COMMUNITY): Payer: Self-pay | Admitting: Registered Nurse

## 2012-09-16 HISTORY — PX: AMPUTATION: SHX166

## 2012-09-16 HISTORY — PX: I & D EXTREMITY: SHX5045

## 2012-09-16 LAB — BASIC METABOLIC PANEL
BUN: 10 mg/dL (ref 6–23)
Calcium: 9.4 mg/dL (ref 8.4–10.5)
GFR calc non Af Amer: 79 mL/min — ABNORMAL LOW (ref >90)
Glucose, Bld: 113 mg/dL — ABNORMAL HIGH (ref 70–99)
Sodium: 135 mEq/L (ref 135–145)

## 2012-09-16 LAB — GLUCOSE, CAPILLARY
Glucose-Capillary: 99 mg/dL (ref 70–99)
Glucose-Capillary: 134 mg/dL — ABNORMAL HIGH (ref 70–99)

## 2012-09-16 LAB — CBC
Hemoglobin: 9.7 g/dL — ABNORMAL LOW (ref 13.0–17.0)
MCH: 33.1 pg (ref 26.0–34.0)
MCHC: 34.4 g/dL (ref 30.0–36.0)

## 2012-09-16 LAB — SURGICAL PCR SCREEN
MRSA, PCR: NEGATIVE
Staphylococcus aureus: NEGATIVE

## 2012-09-16 SURGERY — IRRIGATION AND DEBRIDEMENT EXTREMITY
Anesthesia: General | Site: Foot | Laterality: Left | Wound class: Dirty or Infected

## 2012-09-16 MED ORDER — SODIUM CHLORIDE 0.9 % IR SOLN
Status: DC | PRN
  Administered 2012-09-16: 3000 mL

## 2012-09-16 MED ORDER — ONDANSETRON HCL 4 MG/2ML IJ SOLN
INTRAMUSCULAR | Status: DC | PRN
  Administered 2012-09-16: 4 mg via INTRAVENOUS

## 2012-09-16 MED ORDER — FENTANYL CITRATE 0.05 MG/ML IJ SOLN
INTRAMUSCULAR | Status: DC | PRN
  Administered 2012-09-16: 50 ug via INTRAVENOUS

## 2012-09-16 MED ORDER — MORPHINE SULFATE 2 MG/ML IJ SOLN: 1.0000 mg | INTRAMUSCULAR | Status: DC | PRN

## 2012-09-16 MED ORDER — PROMETHAZINE HCL 25 MG/ML IJ SOLN: 6.2500 mg | INTRAMUSCULAR | Status: DC | PRN

## 2012-09-16 MED ORDER — 0.9 % SODIUM CHLORIDE (POUR BTL) OPTIME
TOPICAL | Status: DC | PRN
  Administered 2012-09-16: 1000 mL

## 2012-09-16 MED ORDER — DIPHENHYDRAMINE HCL 50 MG/ML IJ SOLN
INTRAMUSCULAR | Status: DC | PRN
  Administered 2012-09-16 (×2): 12.5 mg via INTRAVENOUS

## 2012-09-16 MED ORDER — LIDOCAINE HCL (CARDIAC) 10 MG/ML IV SOLN
INTRAVENOUS | Status: DC | PRN
  Administered 2012-09-16: 50 mg via INTRAVENOUS

## 2012-09-16 MED ORDER — BACITRACIN ZINC 500 UNIT/GM EX OINT
TOPICAL_OINTMENT | CUTANEOUS | Status: DC | PRN
  Administered 2012-09-16: 1 via TOPICAL

## 2012-09-16 MED ORDER — PROPOFOL 10 MG/ML IV BOLUS
INTRAVENOUS | Status: DC | PRN
  Administered 2012-09-16: 150 mg via INTRAVENOUS

## 2012-09-16 MED ORDER — LACTATED RINGERS IV SOLN: INTRAVENOUS | Status: DC

## 2012-09-16 SURGICAL SUPPLY — 65 items
ANCHOR SUT KEITH ABD SZ2 STR (Anchor) ×2 IMPLANT
BAG ZIPLOCK 12X15 (MISCELLANEOUS) ×2 IMPLANT
BANDAGE CONFORM 3  STR LF (GAUZE/BANDAGES/DRESSINGS) ×4 IMPLANT
BANDAGE ELASTIC 4 VELCRO ST LF (GAUZE/BANDAGES/DRESSINGS) ×4 IMPLANT
BANDAGE ELASTIC 6 VELCRO ST LF (GAUZE/BANDAGES/DRESSINGS) IMPLANT
BLADE OSCILLATING/SAGITTAL (BLADE) ×2
BLADE SW THK.38XMED LNG THN (BLADE) ×2 IMPLANT
BNDG ESMARK 4X9 LF (GAUZE/BANDAGES/DRESSINGS) ×2 IMPLANT
CLOTH BEACON ORANGE TIMEOUT ST (SAFETY) ×2 IMPLANT
CONT SPECI 4OZ STER CLIK (MISCELLANEOUS) IMPLANT
CUFF TOURN SGL QUICK 34 (TOURNIQUET CUFF)
CUFF TRNQT CYL 34X4X40X1 (TOURNIQUET CUFF) IMPLANT
DECANTER SPIKE VIAL GLASS SM (MISCELLANEOUS) IMPLANT
DRAPE C-ARM 42X72 X-RAY (DRAPES) IMPLANT
DRAPE LG THREE QUARTER DISP (DRAPES) IMPLANT
DRAPE OEC MINIVIEW 54X84 (DRAPES) IMPLANT
DRAPE SURG 17X11 SM STRL (DRAPES) IMPLANT
DRAPE U-SHAPE 47X51 STRL (DRAPES) ×2 IMPLANT
DRSG ADAPTIC 3X8 NADH LF (GAUZE/BANDAGES/DRESSINGS) IMPLANT
DRSG EMULSION OIL 3X3 NADH (GAUZE/BANDAGES/DRESSINGS) ×2 IMPLANT
DRSG PAD ABDOMINAL 8X10 ST (GAUZE/BANDAGES/DRESSINGS) IMPLANT
DURAPREP 26ML APPLICATOR (WOUND CARE) ×4 IMPLANT
ELECT REM PT RETURN 9FT ADLT (ELECTROSURGICAL) ×2
ELECTRODE REM PT RTRN 9FT ADLT (ELECTROSURGICAL) ×1 IMPLANT
FACESHIELD LNG OPTICON STERILE (SAFETY) IMPLANT
GAUZE PACKING IODOFORM 1 (PACKING) IMPLANT
GAUZE PACKING IODOFORM 1/2 (PACKING) IMPLANT
GLOVE BIO SURGEON STRL SZ8 (GLOVE) ×2 IMPLANT
GLOVE BIOGEL PI IND STRL 8 (GLOVE) ×1 IMPLANT
GLOVE BIOGEL PI INDICATOR 8 (GLOVE) ×1
GLOVE ECLIPSE 8.0 STRL XLNG CF (GLOVE) ×2 IMPLANT
GLOVE ORTHO TXT STRL SZ7.5 (GLOVE) IMPLANT
GOWN PREVENTION PLUS XLARGE (GOWN DISPOSABLE) ×4 IMPLANT
KIT BASIN OR (CUSTOM PROCEDURE TRAY) ×2 IMPLANT
MANIFOLD NEPTUNE II (INSTRUMENTS) ×2 IMPLANT
NEEDLE HYPO 22GX1.5 SAFETY (NEEDLE) IMPLANT
NEEDLE MAYO .5 CIRCLE (NEEDLE) IMPLANT
NS IRRIG 1000ML POUR BTL (IV SOLUTION) ×2 IMPLANT
PACK LOWER EXTREMITY WL (CUSTOM PROCEDURE TRAY) ×2 IMPLANT
PAD ABD 7.5X8 STRL (GAUZE/BANDAGES/DRESSINGS) ×4 IMPLANT
PAD CAST 4YDX4 CTTN HI CHSV (CAST SUPPLIES) ×1 IMPLANT
PADDING CAST ABS 3INX4YD NS (CAST SUPPLIES)
PADDING CAST ABS COTTON 3X4 (CAST SUPPLIES) IMPLANT
PADDING CAST COTTON 4X4 STRL (CAST SUPPLIES) ×1
POSITIONER SURGICAL ARM (MISCELLANEOUS) ×2 IMPLANT
SET CYSTO W/LG BORE CLAMP LF (SET/KITS/TRAYS/PACK) ×2 IMPLANT
SPONGE GAUZE 4X4 12PLY (GAUZE/BANDAGES/DRESSINGS) IMPLANT
SPONGE LAP 4X18 X RAY DECT (DISPOSABLE) IMPLANT
STAPLER VISISTAT 35W (STAPLE) IMPLANT
STOCKINETTE 8 INCH (MISCELLANEOUS) IMPLANT
STRIP CLOSURE SKIN 1/2X4 (GAUZE/BANDAGES/DRESSINGS) IMPLANT
SUT ETHIBOND NAB BRD #0 18IN (SUTURE) IMPLANT
SUT ETHILON 2 0 PS N (SUTURE) IMPLANT
SUT ETHILON 2 0 PSLX (SUTURE) IMPLANT
SUT NYLON 3 0 (SUTURE) IMPLANT
SUT NYLON 4 0 (SUTURE) IMPLANT
SUT PROLENE 3 0 PS 2 (SUTURE) IMPLANT
SUT VIC AB 2-0 CT1 27 (SUTURE)
SUT VIC AB 2-0 CT1 TAPERPNT 27 (SUTURE) IMPLANT
SUT VIC AB 3-0 PS2 18 (SUTURE)
SUT VIC AB 3-0 PS2 18XBRD (SUTURE) IMPLANT
SWAB COLLECTION DEVICE MRSA (MISCELLANEOUS) IMPLANT
SYR CONTROL 10ML LL (SYRINGE) IMPLANT
TUBE ANAEROBIC SPECIMEN COL (MISCELLANEOUS) IMPLANT
WATER STERILE IRR 1500ML POUR (IV SOLUTION) IMPLANT

## 2012-09-16 NOTE — Op Note (Signed)
NAMEJOHN, WILLIAMSEN NO.:  [id number]  MEDICAL RECORD NO.:  [id number]  LOCATION:  FOOT                         FACILITY:  [facility]  PHYSICIAN:  [name], MD        DATE OF BIRTH:  1927-09-05  DATE OF PROCEDURE:  09/16/2012 DATE OF DISCHARGE:                              OPERATIVE REPORT   PREOPERATIVE DIAGNOSIS:  Left forefoot diabetic ulcers.  POSTOPERATIVE DIAGNOSES: 1. Left forefoot diabetic ulcers. 2. Osteomyelitis of the left medial sesamoid 3. Pyarthrosis and abscess around the left first metatarsophalangeal     joint.  PROCEDURE:  Left first ray amputation.  SURGEON:  [name], MD  ANESTHESIA:  General.  ESTIMATED BLOOD LOSS:  Approximately 50 mL.  TOURNIQUET TIME:  Approximately 20 minutes with an ankle Esmarch.  COMPLICATIONS:  None apparent.  SPECIMEN:  Deep tissue to Microbiology for aerobic and anaerobic culture.  DISPOSITION:  Extubated awake and stable to recovery.  INDICATIONS FOR PROCEDURE:  The patient is an 78 year old male with history of a left forefoot ulcer since August of this year.  He was admitted to the hospital last week with cellulitis and responded to IV antibiotics.  He went home and had nearly immediate recurrence of the cellulitis.  He was readmitted to the hospital and started on vancomycin and Zosyn.  An additional ulcer has formed on the plantar aspect of the forefoot in addition to the ulcer that was already present on the medial aspect at the metatarsal head.  He has failed local wound care measures and presents now for I and D of the left forefoot diabetic ulcers versus possible first ray amputation.  He understands the risks and benefits of the alternative treatment options and elects surgical treatment.  He specifically understands risks of bleeding, infection, nerve damage, blood clots, need for additional surgery, revision amputation, and death.  PROCEDURE IN DETAIL:  After preoperative consent was  obtained, the correct operative site was identified.  The patient was brought to the operating room and placed supine on the operating table.  The patient was already on therapeutic antibiotics.  General anesthesia was induced. Surgical time-out was taken.  The left lower extremity was then prepped and draped in standard sterile fashion.  The foot was exsanguinated and a 4-inch Esmarch tourniquet was wrapped around the ankle.  The patient's diabetic ulcers were identified at the medial aspect of the first metatarsal head and on the plantar aspect under the first metatarsal head.  The margins of these wounds were excised and the wounds were inspected.  Underneath the plantar wound immediately evident was exposed bone of the medial sesamoid.  The sesamoid articulation with metatarsal head was noted to have purulence.  At this point, all of the necrotic material was sharply excised.  This left a large defect at the plantar medial forefoot with underlying osteomyelitis.  The decision was made to proceed with first ray amputation given the best opportunity to heal this wound primarily.  A flap style incision was marked around the hallux to allow the tissue to be reflected back proximally and plantarly to cover the missing tissue from the location of previous diabetic ulcers.  So sharp dissection  was then carried down through the hallux along the previously marked incision.  The toe was disarticulated at the IP joint and passed off the field.  The proximal phalanx was skeletonized and resected leaving full-thickness flaps circumferentially.  The metatarsal head was exposed and an oscillating saw was used to resect the metatarsal head in the area of the shaft. Both sesamoids were excised in their entirety.  At this point, the tourniquet was released and the perfusion of the flap was assessed.  It was noted to have good blood flow and to be pink and well perfused throughout.  Hemostasis was  achieved.  The flap was then closed back over the wound defect achieving a tension-free closure.  This was done with 2-0 nylon, vertical mattress, and simple sutures.  Prior to closure, the wound had been irrigated copiously with 3 L of normal saline and sharp debridement was carried out of all necrotic material. An area of purulent necrotic material was excised with a rongeur and sent as a specimen to Microbiology for anaerobic and aerobic culture. After wound closure, sterile dressings were applied followed by a compression wrap.  The patient was then awakened from anesthesia and transported to the recovery room in stable condition.  FOLLOWUP PLAN:  The patient will be weightbearing as tolerated on his left foot in a hard-sole shoe.  He will continue on IV vancomycin and Zosyn per Dr. Eloise Harman.     [name], MD     JH/MEDQ  D:  09/16/2012  T:  09/16/2012  Job:  [id number]

## 2012-09-16 NOTE — Progress Notes (Signed)
   CARE MANAGEMENT NOTE 09/16/2012  Patient:  [name], [name]   Account Number:  [id number]  Date Initiated:  09/16/2012  Documentation initiated by:  Johny Shock  Subjective/Objective Assessment:   Pt will need HH for PT at d/c     Action/Plan:   Met with pt daughters and spoke to pt all state that he had HH prior to adm however unable to recall name of agency, unable to reach pt wife. Will continue to follow.   Anticipated DC Date:  09/18/2012   Anticipated DC Plan:  HOME W HOME HEALTH SERVICES         Choice offered to / List presented to:             Status of service:  In process, will continue to follow Medicare Important Message given?   (If response is "NO", the following Medicare IM given date fields will be blank) Date Medicare IM given:   Date Additional Medicare IM given:    Discharge Disposition:  HOME W HOME HEALTH SERVICES  Per UR Regulation:    If discussed at Long Length of Stay Meetings, dates discussed:    Comments:

## 2012-09-16 NOTE — Progress Notes (Signed)
I took over his nursing care at 1530 hours.  He has been comfortable, oriented x 4 and the bulky dressing on his left foot remains dry and intact.  Exposed toes left foot show immediate cap. Refill.

## 2012-09-16 NOTE — Interval H&P Note (Signed)
History and Physical Interval Note:  09/16/2012 7:37 AM  [name]  has presented today for surgery, with the diagnosis of left fore foot diabetic ulcer   The various methods of treatment have been discussed with the patient and family. After consideration of risks, benefits and other options for treatment, the patient has consented to  Procedure(s) (LRB) with comments: IRRIGATION AND DEBRIDEMENT EXTREMITY (Left) - Irrigation adn debridement of left fore foot diabetic ulcer possible first ray amputation possible application of wound vac  AMPUTATION RAY (Left) APPLICATION OF WOUND VAC (Left) as a surgical intervention .  The patient's history has been reviewed, patient examined, no change in status, stable for surgery.  I have reviewed the patient's chart and labs.  Questions were answered to the patient's satisfaction.     [name]

## 2012-09-16 NOTE — Brief Op Note (Signed)
09/13/2012 - 09/16/2012  9:04 AM  PATIENT:  Bonne Dolores  78 y.o. male  PRE-OPERATIVE DIAGNOSIS:  left fore foot diabetic ulcers   POST-OPERATIVE DIAGNOSIS:  1.  Left forefoot diabetic ulcers  2.  Osteomyelitis of the left medial sesamoid  3.  Pyarthrosis and abscess of the 1st MTP joint  Procedure(s): Left 1st ray amputation  SURGEON:  [name], MD  ASSISTANT: n/a  ANESTHESIA:   General  EBL:  50 cc  TOURNIQUET:  approx 20 min with an ankle esmarch  COMPLICATIONS:  None apparent  Specimen:  Deep tissue to micro for aerobic and anaerobic culture  DISPOSITION:  Extubated, awake and stable to recovery.  DICTATION ID:  [id number]

## 2012-09-16 NOTE — Anesthesia Postprocedure Evaluation (Signed)
Anesthesia Post Note  Patient: [name]  Procedure(s) Performed: Procedure(s) (LRB): IRRIGATION AND DEBRIDEMENT EXTREMITY (Left) AMPUTATION RAY (Left)  Anesthesia type: General  Patient location: PACU  Post pain: Pain level controlled  Post assessment: Post-op Vital signs reviewed  Last Vitals:  Filed Vitals:   09/16/12 0930  BP: 139/117  Pulse: 78  Temp: 36.8 C  Resp: 24    Post vital signs: Reviewed  Level of consciousness: sedated  Complications: No apparent anesthesia complications

## 2012-09-16 NOTE — Progress Notes (Signed)
Subjective: Seen briefly prior to his surgery/debridement.  A bit nervous, otherwise uneventful evening.  Objective: Vital signs in last 24 hours: Temp:  [97.5 F (36.4 C)-98.5 F (36.9 C)] 98.2 F (36.8 C) (10/12 0516) Pulse Rate:  [58-71] 58  (10/12 0516) Resp:  [18] 18  (10/12 0516) BP: (139-161)/(57-72) 154/68 mmHg (10/12 0516) SpO2:  [94 %-96 %] 95 % (10/12 0516) Weight change:  Last BM Date: 09/14/12  Intake/Output from previous day: 10/11 0701 - 10/12 0700 In: 4325 [P.O.:2710; I.V.:1315; IV Piggyback:300] Out: 1850 [Urine:1850] Intake/Output this shift: Total I/O In: 981.7 [I.V.:981.7] Out: -  General appearance: alert, cooperative and no distress  Resp: clear to auscultation bilaterally  Cardio: regular rate and rhythm and with 2/6 SEM  GI: soft, non-tender; bowel sounds normal; no masses, no organomegaly  Extremities: extremities normal, atraumatic, no cyanosis or edema and left foot wrapped.  Lab Results:  Basename 09/16/12 0550 09/14/12 0440  WBC 3.7* 4.1  HGB 9.7* 9.5*  HCT 28.2* 28.0*  PLT 78* 84*   BMET  Basename 09/16/12 0550 09/14/12 0440  NA 135 137  K 3.8 3.5  CL 102 103  CO2 24 25  GLUCOSE 113* 122*  BUN 10 18  CREATININE 0.84 0.90  CALCIUM 9.4 9.3    Studies/Results: Dg Foot 2 Views Left  09/14/2012  *RADIOLOGY REPORT*  Clinical Data: Ulcer over first MTP joint, evaluate for osteomyelitis  LEFT FOOT - 2 VIEW  Comparison: Left foot MRI dated 09/08/2012  Findings: Soft tissue ulcer along the medial aspect of the first MTP joint.  No underlying cortical irregularity.  No fracture or dislocation is seen.  Vascular calcifications.  IMPRESSION: No radiographic findings to suggest osteomyelitis, noting that the recent MRI was much more sensitive.   Original Report Authenticated By: Charline Bills, M.D.     Medications:  I have reviewed the patient's current medications. Scheduled:   . acyclovir  400 mg Oral BID  . aspirin EC  81 mg Oral  Daily  . chlorhexidine  60 mL Topical Once  . cyanocobalamin  500 mcg Oral Daily  . gabapentin  300 mg Oral QHS  . gabapentin  600 mg Oral BID WC  . insulin aspart  0-15 Units Subcutaneous TID WC  . insulin aspart  0-5 Units Subcutaneous QHS  . insulin glargine  10 Units Subcutaneous QHS  . isosorbide mononitrate  30 mg Oral Daily  . meloxicam  15 mg Oral Daily  . metoprolol succinate  25 mg Oral Daily  . piperacillin-tazobactam (ZOSYN)  IV  3.375 g Intravenous Q8H  . simvastatin  10 mg Oral q1800  . sodium chloride  3 mL Intravenous Q12H  . vancomycin  1,000 mg Intravenous Q12H   Continuous:   . sodium chloride 100 mL/hr at 09/16/12 0012  . 0.9 % NaCl with KCl 20 mEq / L 100 mL/hr (09/15/12 0800)  . lactated ringers     PRN:0.9 % irrigation (POUR BTL), acetaminophen, acetaminophen, alum & mag hydroxide-simeth, bisacodyl, diphenhydrAMINE, morphine injection, ondansetron, oxyCODONE, promethazine, sodium chloride irrigation, zolpidem  Assessment/Plan: #1 Sepsis syndrome: improved although still with low grade fevers.Surgical debridement and possible ray amputation per Hewitt this AM. Will continue Zosyn and Vancomycin and review cultures post biopsy.  #2 DM2: stable on current meds.  #3 CAD: stable with no angina   LOS: 3 days   Alila Sotero W 09/16/2012, 8:23 AM

## 2012-09-16 NOTE — Transfer of Care (Signed)
Immediate Anesthesia Transfer of Care Note  Patient: [name]  Procedure(s) Performed: Procedure(s) (LRB) with comments: IRRIGATION AND DEBRIDEMENT EXTREMITY (Left) - irrigation and debridement and first Ray amputation of left foot AMPUTATION RAY (Left) - first Ray amputation left foot  Patient Location: PACU  Anesthesia Type: General  Level of Consciousness: awake, alert , oriented and patient cooperative  Airway & Oxygen Therapy: Patient Spontanous Breathing and Patient connected to face mask oxygen  Post-op Assessment: Report given to PACU RN, Post -op Vital signs reviewed and stable and Patient moving all extremities X 4  Post vital signs: stable  Complications: No apparent anesthesia complications

## 2012-09-16 NOTE — Progress Notes (Signed)
Patient transported to OR @ [phone] via bed by this RN and nurse tech. CHG bath completed prior to transport. Informed consent, blood consent and EKG placed in the front of the chart. Patient NPO since MN per order. Labs drawn by lab tech prior to transport, pending at time of transport. Patient denied pain or other issues. Will report patient information to oncoming shift RN. Family aware of surgery, to be at facility at time of surgery this AM.

## 2012-09-16 NOTE — Preoperative (Addendum)
Beta Blockers   Reason not to administer Beta Blockers:Hold  beta blocker due to Bradycardia (HR less than 50 bpm)   Heart rate slow  Enough for  surgery

## 2012-09-16 NOTE — Anesthesia Preprocedure Evaluation (Signed)
Anesthesia Evaluation  Patient identified by MRN, date of birth, ID band Patient awake    Reviewed: Allergy & Precautions, H&P , NPO status , Patient's Chart, lab work & pertinent test results  Airway Mallampati: II TM Distance: >3 FB Neck ROM: Full    Dental  (+) Teeth Intact, Partial Lower and Dental Advisory Given   Pulmonary asthma , COPDformer smoker,  + rhonchi (Faint rhonchi right lung otherwise clear; SaO2 98% on room air)         Cardiovascular hypertension, + CAD, + Past MI, + Cardiac Stents, + CABG and + Peripheral Vascular Disease + Valvular Problems/Murmurs AS Rhythm:Regular Rate:Normal + Systolic murmurs    Neuro/Psych  Neuromuscular disease negative neurological ROS  negative psych ROS   GI/Hepatic negative GI ROS, Neg liver ROS,   Endo/Other  diabetes, Type 2  Renal/GU negative Renal ROS  negative genitourinary   Musculoskeletal negative musculoskeletal ROS (+)   Abdominal   Peds  Hematology negative hematology ROS (+) Hx Lymphoma; thrombocytopenia   Anesthesia Other Findings   Reproductive/Obstetrics negative OB ROS                           Anesthesia Physical Anesthesia Plan  ASA: III and Emergent  Anesthesia Plan: General   Post-op Pain Management:    Induction: Intravenous  Airway Management Planned: LMA  Additional Equipment:   Intra-op Plan:   Post-operative Plan: Extubation in OR  Informed Consent: I have reviewed the patients History and Physical, chart, labs and discussed the procedure including the risks, benefits and alternatives for the proposed anesthesia with the patient or authorized representative who has indicated his/her understanding and acceptance.   Dental advisory given  Plan Discussed with: CRNA  Anesthesia Plan Comments:         Anesthesia Quick Evaluation

## 2012-09-17 LAB — COMPREHENSIVE METABOLIC PANEL
AST: 12 U/L (ref 0–37)
Albumin: 2.5 g/dL — ABNORMAL LOW (ref 3.5–5.2)
CO2: 24 mEq/L (ref 19–32)
Calcium: 9.4 mg/dL (ref 8.4–10.5)
Creatinine, Ser: 0.94 mg/dL (ref 0.50–1.35)
GFR calc non Af Amer: 75 mL/min — ABNORMAL LOW (ref >90)
Total Protein: 5.2 g/dL — ABNORMAL LOW (ref 6.0–8.3)

## 2012-09-17 LAB — CBC
MCH: 33.7 pg (ref 26.0–34.0)
MCHC: 34.8 g/dL (ref 30.0–36.0)
MCV: 96.8 fL (ref 78.0–100.0)
Platelets: 78 10*3/uL — ABNORMAL LOW (ref 150–400)
RDW: 14.6 % (ref 11.5–15.5)

## 2012-09-17 LAB — GLUCOSE, CAPILLARY
Glucose-Capillary: 97 mg/dL (ref 70–99)
Glucose-Capillary: 96 mg/dL (ref 70–99)

## 2012-09-17 NOTE — Progress Notes (Addendum)
ANTIBIOTIC CONSULT NOTE - FOLLOW UP  Pharmacy Consult for Vancomycin/Zosyn  Indication: osteomyelitis/nonhealing ulcer/resolving sepsis  Allergies  Allergen Reactions  . Azithromycin Itching  . Macrodantin Itching  . Minocycline Hcl Itching  . Nitrofurantoin Itching  . Zithromax (Azithromycin Dihydrate) Itching  . Contrast Media (Iodinated Diagnostic Agents) Rash    Patient Measurements: Height: 6\' 1"  (185.4 cm) Weight: 190 lb 0.6 oz (86.2 kg) IBW/kg (Calculated) : 79.9    Vital Signs: Temp: 99.1 F (37.3 C) (10/13 0614) Temp src: Oral (10/13 0614) BP: 139/57 mmHg (10/13 0614) Pulse Rate: 62  (10/13 0614) Intake/Output from previous day: 10/12 0701 - 10/13 0700 In: 2801.7 [P.O.:120; I.V.:2681.7] Out: 600 [Urine:600] Intake/Output from this shift: Total I/O In: -  Out: 550 [Urine:550]  Labs:  [facility] 09/17/12 0555 09/16/12 0550  WBC 5.3 3.7*  HGB 9.6* 9.7*  PLT 78* 78*  LABCREA -- --  CREATININE 0.94 0.84   Estimated Creatinine Clearance: 67.3 ml/min (by C-G formula based on Cr of 0.94). No results found for this basename: VANCOTROUGH:2,VANCOPEAK:2,VANCORANDOM:2,GENTTROUGH:2,GENTPEAK:2,GENTRANDOM:2,TOBRATROUGH:2,TOBRAPEAK:2,TOBRARND:2,AMIKACINPEAK:2,AMIKACINTROU:2,AMIKACIN:2, in the last 72 hours   Microbiology: Recent Results (from the past 720 hour(s))  CULTURE, BLOOD (ROUTINE X 2)     Status: Normal   Collection Time   09/07/12  7:40 PM      Component Value Range Status Comment   Specimen Description BLOOD LEFT ANTECUBITAL   Final    Special Requests BOTTLES DRAWN AEROBIC AND ANAEROBIC 5CC   Final    Culture  Setup Time 09/08/2012 02:01   Final    Culture NO GROWTH 5 DAYS   Final    Report Status 09/14/2012 FINAL   Final   URINE CULTURE     Status: Normal   Collection Time   09/07/12  7:54 PM      Component Value Range Status Comment   Specimen Description URINE, RANDOM   Final    Special Requests NONE   Final    Culture  Setup Time 09/08/2012 02:31    Final    Colony Count 5,000 COLONIES/ML   Final    Culture INSIGNIFICANT GROWTH   Final    Report Status 09/08/2012 FINAL   Final   CULTURE, BLOOD (ROUTINE X 2)     Status: Normal (Preliminary result)   Collection Time   09/13/12  3:40 PM      Component Value Range Status Comment   Specimen Description BLOOD RIGHT ARM   Final    Special Requests BOTTLES DRAWN AEROBIC AND ANAEROBIC 5CC   Final    Culture  Setup Time 09/13/2012 21:13   Final    Culture     Final    Value:        BLOOD CULTURE RECEIVED NO GROWTH TO DATE CULTURE WILL BE HELD FOR 5 DAYS BEFORE ISSUING A FINAL NEGATIVE REPORT   Report Status PENDING   Incomplete   CULTURE, BLOOD (ROUTINE X 2)     Status: Normal (Preliminary result)   Collection Time   09/13/12  5:05 PM      Component Value Range Status Comment   Specimen Description BLOOD RIGHT HAND   Final    Special Requests BOTTLES DRAWN AEROBIC AND ANAEROBIC 5CC   Final    Culture  Setup Time 09/13/2012 21:13   Final    Culture     Final    Value:        BLOOD CULTURE RECEIVED NO GROWTH TO DATE CULTURE WILL BE HELD FOR 5 DAYS BEFORE ISSUING A  FINAL NEGATIVE REPORT   Report Status PENDING   Incomplete   SURGICAL PCR SCREEN     Status: Normal   Collection Time   09/16/12  4:03 AM      Component Value Range Status Comment   MRSA, PCR NEGATIVE  NEGATIVE Final    Staphylococcus aureus NEGATIVE  NEGATIVE Final   TISSUE CULTURE     Status: Normal (Preliminary result)   Collection Time   09/16/12  8:26 AM      Component Value Range Status Comment   Specimen Description FOOT LEFT FORE FOOT DIABETIC ULCER   Final    Special Requests NONE   Final    Gram Stain PENDING   Incomplete    Culture NO GROWTH 1 DAY   Final    Report Status PENDING   Incomplete   ANAEROBIC CULTURE     Status: Normal (Preliminary result)   Collection Time   09/16/12  8:26 AM      Component Value Range Status Comment   Specimen Description FOOT LEFT FORE FOOT DIABETIC ULCER   Final    Special  Requests NONE   Final    Gram Stain PENDING   Incomplete    Culture     Final    Value: NO ANAEROBES ISOLATED; CULTURE IN PROGRESS FOR 5 DAYS   Report Status PENDING   Incomplete     Anti-infectives     Start     Dose/Rate Route Frequency Ordered Stop   09/14/12 0500   vancomycin (VANCOCIN) IVPB 1000 mg/200 mL premix        1,000 mg 200 mL/hr over 60 Minutes Intravenous Every 12 hours 09/13/12 1651     09/14/12 0100  piperacillin-tazobactam (ZOSYN) IVPB 3.375 g       3.375 g 12.5 mL/hr over 240 Minutes Intravenous Every 8 hours 09/13/12 1653     09/13/12 2200   acyclovir (ZOVIRAX) tablet 400 mg        400 mg Oral 2 times daily 09/13/12 2028     09/13/12 1600  piperacillin-tazobactam (ZOSYN) IVPB 3.375 g       3.375 g 100 mL/hr over 30 Minutes Intravenous  Once 09/13/12 1546 09/13/12 1756   09/13/12 1600   vancomycin (VANCOCIN) IVPB 1000 mg/200 mL premix        1,000 mg 200 mL/hr over 60 Minutes Intravenous  Once 09/13/12 1546 09/13/12 1941          Assessment:  78 yo M with nonhealing diabetic foot ulcer, s/p Left 1st ray amputation and I&D on 10/12 which revealed osteomyelitis of the left medial sesamoid and pyarthyrosis and abscess of the 1st MTP joint. Patient is on D#5 Vancomycin 1 gram IV q12h and Zosyn 3.375 grams IV q8h infused over 4 hours.  Patient currently with low grade fevers s/p procedure yesterday   Tissues cultures from foot pending, Blood cultures no growth to date  Renal function is stable and WBC is wnl  Vancomycin is now at steady state, will obtain vancomycin trough to assure appropriate dosing in anticipation of PICC line and long term antibiotics.    Goal of Therapy:  Vancomycin trough level 15-20 mcg/ml  Plan:  1.) Continue Vancomycin 1 gram IV q12h, Continue Zosyn EI 2.) Vancomycin trough today at 1630 3.) Continue to monitor renal function, wbc, temp 4.) Follow up final blood cultures and foot cultures pending.    BorgerdingLoma Messing PharmD Pager #: [phone] 12:42 PM 09/17/2012   ADDENDUM: Everlean Patterson  trough therapeutic (17). Will continue current regimen of Vanco 1g IV q12h.  Darrol Angel, PharmD Pager: [phone] 09/17/2012 6:20 PM

## 2012-09-17 NOTE — Progress Notes (Signed)
Subjective: Feels ok this AM.  Passing urine without difficulty.  Had some pain in his foot last night, but the pain pill helped him rest comfortably.   No new complaints.  Objective: Vital signs in last 24 hours: Temp:  [97.7 F (36.5 C)-99.8 F (37.7 C)] 99.1 F (37.3 C) (10/13 0614) Pulse Rate:  [57-78] 62  (10/13 0614) Resp:  [13-24] 20  (10/13 0614) BP: (102-161)/(57-117) 139/57 mmHg (10/13 0614) SpO2:  [92 %-100 %] 95 % (10/13 0614) Weight change:  Last BM Date: 09/16/12  Intake/Output from previous day: 10/12 0701 - 10/13 0700 In: 2801.7 [P.O.:120; I.V.:2681.7] Out: 600 [Urine:600] Intake/Output this shift: Total I/O In: -  Out: 200 [Urine:200]  General appearance: alert, cooperative and no distress  Resp: clear to auscultation bilaterally  Cardio: regular rate and rhythm and with 2/6 SEM  GI: soft, non-tender; bowel sounds normal; no masses, no organomegaly  Extremities: extremities normal, atraumatic, no cyanosis or edema and left foot wrapped post surgery   Lab Results:  [facility] 09/17/12 0555 09/16/12 0550  WBC 5.3 3.7*  HGB 9.6* 9.7*  HCT 27.6* 28.2*  PLT 78* 78*   BMET  Basename 09/17/12 0555 09/16/12 0550  NA 134* 135  K 4.0 3.8  CL 102 102  CO2 24 24  GLUCOSE 92 113*  BUN 10 10  CREATININE 0.94 0.84  CALCIUM 9.4 9.4    Studies/Results: No results found.  Medications:  I have reviewed the patient's current medications. Scheduled:   . acyclovir  400 mg Oral BID  . aspirin EC  81 mg Oral Daily  . cyanocobalamin  500 mcg Oral Daily  . gabapentin  300 mg Oral QHS  . gabapentin  600 mg Oral BID WC  . insulin aspart  0-15 Units Subcutaneous TID WC  . insulin aspart  0-5 Units Subcutaneous QHS  . insulin glargine  10 Units Subcutaneous QHS  . isosorbide mononitrate  30 mg Oral Daily  . meloxicam  15 mg Oral Daily  . metoprolol succinate  25 mg Oral Daily  . piperacillin-tazobactam (ZOSYN)  IV  3.375 g Intravenous Q8H  . simvastatin  10 mg  Oral q1800  . sodium chloride  3 mL Intravenous Q12H  . vancomycin  1,000 mg Intravenous Q12H   Continuous:   . sodium chloride 100 mL/hr at 09/17/12 0555  . DISCONTD: lactated ringers     ZOX:[id number], acetaminophen, alum & mag hydroxide-simeth, bisacodyl, diphenhydrAMINE, ondansetron, oxyCODONE, zolpidem, DISCONTD:  morphine injection, DISCONTD: promethazine  Assessment/Plan: #1 Sepsis syndrome: improved although still with low grade fevers.Surgical debridement and possible ray amputation per Hewitt yesterday morning. Will continue Zosyn and Vancomycin and review cultures post biopsy.  #2 DM2: stable on current meds.  #3 CAD: stable with no angina #4 Osteomyelitis.  Cultures pending.  Will likely need PICC and long term antibiotics.  Wound culture from surgery pending  LOS: 4 days   Ardene Remley W 09/17/2012, 9:00 AM

## 2012-09-17 NOTE — Evaluation (Signed)
Physical Therapy Evaluation Patient Details Name: [name] MRN: [id number] DOB: [birth year] Today's Date: 09/17/2012 Time: 1007-1019 PT Time Calculation (min): 12 min  PT Assessment / Plan / Recommendation Clinical Impression  Pt s/p I&D and 1st ray amputation.  Pt would benefit from acute PT services in order to improve safety and independence with transfers and ambulation while maintaining WB precautions.  Post op shoe not yet in room however pt aware he needs to receive one and discussed with RN.  Pt required increased verbal cues for L heel WBing only with RW.  Pt reports spouse can assist with housework and sister lives next door and come assist as needed.  (sister in room during eval)    PT Assessment  Patient needs continued PT services    Follow Up Recommendations  Home health PT    Does the patient have the potential to tolerate intense rehabilitation      Barriers to Discharge        Equipment Recommendations  Wheelchair (measurements);Other (comment) (narrow w/c if possible to fit in pt's house)    Recommendations for Other Services     Frequency Min 4X/week    Precautions / Restrictions Precautions Required Braces or Orthoses: Other Brace/Splint Other Brace/Splint: post op shoe Restrictions Weight Bearing Restrictions: Yes Other Position/Activity Restrictions: WB through L heel only   Pertinent Vitals/Pain 2/10 L foot, repositioned, elevated LE      Mobility  Transfers Transfers: Sit to Stand;Stand to Sit Sit to Stand: 4: Min guard;From chair/3-in-1;With upper extremity assist Stand to Sit: 4: Min guard;With upper extremity assist;To chair/3-in-1 Details for Transfer Assistance: verbal cues for safe technique esp hand placement and L LE forward Ambulation/Gait Ambulation/Gait Assistance: 4: Min guard Ambulation Distance (Feet): 20 Feet Assistive device: Rolling walker Ambulation/Gait Assistance Details: max verbal cues for L heel WBing only, cues to  use UEs to assist and to keep L LE forward a little to cue more for WB through heel Gait Pattern: Step-to pattern;Decreased stride length;Antalgic Gait velocity: decreased    Shoulder Instructions     Exercises     PT Diagnosis: Difficulty walking  PT Problem List: Decreased strength;Decreased activity tolerance;Decreased mobility;Decreased knowledge of use of DME;Decreased knowledge of precautions PT Treatment Interventions: DME instruction;Gait training;Stair training;Functional mobility training;Therapeutic activities;Therapeutic exercise;Patient/family education;Wheelchair mobility training   PT Goals Acute Rehab PT Goals PT Goal Formulation: With patient Time For Goal Achievement: 09/24/12 Potential to Achieve Goals: Good Pt will go Supine/Side to Sit: with modified independence PT Goal: Supine/Side to Sit - Progress: Goal set today Pt will go Sit to Stand: with modified independence PT Goal: Sit to Stand - Progress: Goal set today Pt will Ambulate: 51 - 150 feet;with supervision;with least restrictive assistive device PT Goal: Ambulate - Progress: Goal set today Pt will Go Up / Down Stairs: 1-2 stairs;with supervision;with least restrictive assistive device PT Goal: Up/Down Stairs - Progress: Goal set today Pt will Perform Home Exercise Program: with supervision, verbal cues required/provided PT Goal: Perform Home Exercise Program - Progress: Goal set today  Visit Information  Last PT Received On: 09/17/12 Assistance Needed: +1    Subjective Data  Subjective: I think I can manage at home.   Prior Functioning  Home Living Lives With: Spouse Available Help at Discharge: Family;Available PRN/intermittently Type of Home: House Home Access: Stairs to enter [employer] of Steps: 1 Entrance Stairs-Rails: Right Home Layout: One level Home Adaptive Equipment: Shower chair with back;Bedside commode/3-in-1;Walker - rolling Prior Function Level of Independence:  Independent Communication Communication: No difficulties    Cognition  Overall Cognitive Status: Appears within functional limits for tasks assessed/performed Arousal/Alertness: Awake/alert Orientation Level: Appears intact for tasks assessed Behavior During Session: [facility] for tasks performed    Extremity/Trunk Assessment Right Upper Extremity Assessment RUE ROM/Strength/Tone: [facility] for tasks assessed Left Upper Extremity Assessment LUE ROM/Strength/Tone: WFL for tasks assessed Right Lower Extremity Assessment RLE ROM/Strength/Tone: [facility] for tasks assessed Left Lower Extremity Assessment LLE ROM/Strength/Tone: Deficits LLE ROM/Strength/Tone Deficits: WFL except did not test ankle due to bandaging   Balance    End of Session PT - End of Session Activity Tolerance: Patient tolerated treatment well Patient left: in chair;with call bell/phone within reach;with family/visitor present Nurse Communication: Mobility status;Weight bearing status  GP     [place],KATHrine E 09/17/2012, 10:32 AM Pager: [phone]

## 2012-09-17 NOTE — Progress Notes (Signed)
Orthopedics Progress Note  Subjective: Pt lying comfortably in no acute distress Minimal soreness to left foot s/p surgery  Objective:  Filed Vitals:   09/17/12 0614  BP: 139/57  Pulse: 62  Temp: 99.1 F (37.3 C)  Resp: 20    General: Awake and alert  Musculoskeletal: left foot with dressing intact, nv intact with other toes Neurovascularly intact  Lab Results  Component Value Date   WBC 5.3 09/17/2012   HGB 9.6* 09/17/2012   HCT 27.6* 09/17/2012   MCV 96.8 09/17/2012   PLT 78* 09/17/2012       Component Value Date/Time   NA 134* 09/17/2012 0555   NA 138 08/21/2012 1308   K 4.0 09/17/2012 0555   K 3.3* 08/21/2012 1308   CL 102 09/17/2012 0555   CL 104 08/21/2012 1308   CO2 24 09/17/2012 0555   CO2 25 08/21/2012 1308   GLUCOSE 92 09/17/2012 0555   GLUCOSE 182* 08/21/2012 1308   BUN 10 09/17/2012 0555   BUN 18.0 08/21/2012 1308   CREATININE 0.94 09/17/2012 0555   CREATININE 1.0 08/21/2012 1308   CALCIUM 9.4 09/17/2012 0555   CALCIUM 9.3 08/21/2012 1308   GFRNONAA 75* 09/17/2012 0555   GFRAA 87* 09/17/2012 0555    Lab Results  Component Value Date   INR 1.09 09/08/2012   INR 1.05 03/29/2012   INR 1.13 02/14/2012    Assessment/Plan: POD #1 s/p Procedure(s):left  IRRIGATION AND DEBRIDEMENT EXTREMITY AMPUTATION RAY  Continue IV antibiotics Pt may bear weight on left heel and get out of bed Pain control  Almedia Balls. Ranell Patrick, MD 09/17/2012 7:34 AM

## 2012-09-17 NOTE — Evaluation (Signed)
Occupational Therapy Evaluation Patient Details Name: [name] MRN: [id number] DOB: Apr 20, 1927 Today's Date: 09/17/2012 Time: 4098-1191 OT Time Calculation (min): 22 min  OT Assessment / Plan / Recommendation Clinical Impression  This 78 yo male s/p left 1st ray amputation presents to acute OT with the problems below. Will benefit from acute OT without need for follow up.    OT Assessment  Patient needs continued OT Services    Follow Up Recommendations  No OT follow up    Barriers to Discharge None    Equipment Recommendations  None recommended by OT;Wheelchair (measurements) (narrow w/c if possible to fit through pt's doorways)       Frequency  Min 2X/week    Precautions / Restrictions Precautions Precautions: Fall Required Braces or Orthoses: Other Brace/Splint Other Brace/Splint: post op shoe Restrictions Weight Bearing Restrictions: Yes Other Position/Activity Restrictions: WB through L heel only       ADL  Eating/Feeding: Simulated;Independent Where Assessed - Eating/Feeding: Edge of bed Grooming: Simulated;Set up Where Assessed - Grooming: Unsupported sitting Upper Body Bathing: Simulated;Set up Where Assessed - Upper Body Bathing: Unsupported sitting Lower Body Bathing: Simulated;Minimal assistance Where Assessed - Lower Body Bathing: Unsupported sit to stand Upper Body Dressing: Simulated;Set up Where Assessed - Upper Body Dressing: Unsupported sitting Lower Body Dressing: Simulated;Minimal assistance Where Assessed - Lower Body Dressing: Unsupported sit to stand Toilet Transfer: Simulated;Min guard Toilet Transfer Method: Sit to Barista:  (Bed to recliner) Toileting - Clothing Manipulation and Hygiene: Simulated;Min guard Where Assessed - Engineer, mining and Hygiene: Standing Equipment Used: Rolling walker Transfers/Ambulation Related to ADLs: Min guard A ADL Comments: Wife can A prn for BADLs with focus on  safety with sit to stand and standing now both supported against bed and unsupported away from bed. Also bed to 3n1 transfers (v. ambulating ot bathroom) so as to maintain safety and not forget that he is not suppose to put weight on right forefoot.    OT Diagnosis: Generalized weakness  OT Problem List: Impaired balance (sitting and/or standing);Decreased knowledge of use of DME or AE;Decreased knowledge of precautions OT Treatment Interventions: Self-care/ADL training;DME and/or AE instruction;Patient/family education;Balance training   OT Goals Acute Rehab OT Goals OT Goal Formulation: With patient Time For Goal Achievement: 09/24/12 Potential to Achieve Goals: Good ADL Goals Pt Will Perform Grooming: with set-up;with supervision;Unsupported;Standing at sink (2 tasks) ADL Goal: Grooming - Progress: Goal set today Pt Will Perform Lower Body Dressing: with set-up;with supervision;Sit to stand from chair;Sit to stand from bed;Unsupported ADL Goal: Lower Body Dressing - Progress: Goal set today Pt Will Transfer to Toilet: with supervision;Squat pivot transfer;Stand pivot transfer;3-in-1 ADL Goal: Toilet Transfer - Progress: Goal set today Pt Will Perform Toileting - Clothing Manipulation: with modified independence;Standing ADL Goal: Toileting - Clothing Manipulation - Progress: Goal set today Pt Will Perform Toileting - Hygiene: with modified independence;Sit to stand from 3-in-1/toilet ADL Goal: Toileting - Hygiene - Progress: Goal set today Miscellaneous OT Goals Miscellaneous OT Goal #1: Pt will be S in and OOB for BADLs. OT Goal: Miscellaneous Goal #1 - Progress: Goal set today Miscellaneous OT Goal #2: Pt will be able to maintain standing balance at EOB with Bil  LEssupported against bed for 30 seconds with Mod I during dynamic activity. OT Goal: Miscellaneous Goal #2 - Progress: Goal set today Miscellaneous OT Goal #3: PT will be able to maintain standing balance at EOB wtihout Bil  LEs supported against bed with S to work on balance  for dynamic standing activities. OT Goal: Miscellaneous Goal #3 - Progress: Goal set today  Visit Information  Last OT Received On: 09/17/12 Assistance Needed: +1    Subjective Data  Subjective: It's not easy to just keep my weight on my heel   Prior Functioning     Home Living Lives With: Spouse Available Help at Discharge: Family;Available PRN/intermittently Type of Home: House Home Access: Stairs to enter [employer] of Steps: 1 Entrance Stairs-Rails: Right Home Layout: One level Bathroom Shower/Tub: Tub/shower unit (will sponge bath until can get foot wet) Bathroom Toilet: Standard Bathroom Accessibility: Yes How Accessible: Accessible via walker Home Adaptive Equipment: Shower chair with back;Bedside commode/3-in-1;Walker - rolling Prior Function Level of Independence: Independent Able to Take Stairs?: Yes Driving: Yes Vocation: Retired Musician: No difficulties Dominant Hand: Right            Cognition  Overall Cognitive Status: Appears within functional limits for tasks assessed/performed Arousal/Alertness: Awake/alert Orientation Level: Appears intact for tasks assessed Behavior During Session: [facility] for tasks performed    Extremity/Trunk Assessment Right Upper Extremity Assessment RUE ROM/Strength/Tone: [facility] for tasks assessed Left Upper Extremity Assessment LUE ROM/Strength/Tone: WFL for tasks assessed Right Lower Extremity Assessment RLE ROM/Strength/Tone: [facility] for tasks assessed Left Lower Extremity Assessment LLE ROM/Strength/Tone: Deficits LLE ROM/Strength/Tone Deficits: WFL except did not test ankle due to bandaging     Mobility Transfers Sit to Stand: 4: Min guard;From chair/3-in-1;With upper extremity assist Stand to Sit: 4: Min guard;With upper extremity assist;To chair/3-in-1 Details for Transfer Assistance: verbal cues for safe technique esp hand placement  and L LE forward              End of Session OT - End of Session Equipment Utilized During Treatment: Gait belt Activity Tolerance: Patient tolerated treatment well Patient left: in bed       Evette Georges [phone] 09/17/2012, 11:54 AM

## 2012-09-18 ENCOUNTER — Ambulatory Visit: Payer: Medicare Other

## 2012-09-18 ENCOUNTER — Encounter (HOSPITAL_COMMUNITY): Payer: Self-pay | Admitting: Orthopedic Surgery

## 2012-09-18 LAB — COMPREHENSIVE METABOLIC PANEL
AST: 12 U/L (ref 0–37)
Albumin: 2.5 g/dL — ABNORMAL LOW (ref 3.5–5.2)
Alkaline Phosphatase: 119 U/L — ABNORMAL HIGH (ref 39–117)
Chloride: 104 mEq/L (ref 96–112)
Potassium: 3.9 mEq/L (ref 3.5–5.1)
Total Bilirubin: 0.3 mg/dL (ref 0.3–1.2)

## 2012-09-18 LAB — CBC
HCT: 28.3 % — ABNORMAL LOW (ref 39.0–52.0)
Platelets: 81 10*3/uL — ABNORMAL LOW (ref 150–400)
RDW: 14.3 % (ref 11.5–15.5)
WBC: 4.3 10*3/uL (ref 4.0–10.5)

## 2012-09-18 MED ORDER — LOSARTAN POTASSIUM 50 MG PO TABS
100.0000 mg | ORAL_TABLET | Freq: Every day | ORAL | Status: DC
  Administered 2012-09-18 – 2012-09-19 (×2): 100 mg via ORAL
  Filled 2012-09-18 (×2): qty 2

## 2012-09-18 MED ORDER — HYDROXYZINE HCL 25 MG PO TABS
25.0000 mg | ORAL_TABLET | Freq: Three times a day (TID) | ORAL | Status: DC
  Administered 2012-09-18 – 2012-09-19 (×4): 25 mg via ORAL
  Filled 2012-09-18 (×6): qty 1

## 2012-09-18 NOTE — Progress Notes (Signed)
Physical Therapy Treatment Patient Details Name: MARQUINN MESCHKE MRN: [id number] DOB: 1927-01-18 Today's Date: 09/18/2012 Time: 4098-1191 PT Time Calculation (min): 32 min  PT Assessment / Plan / Recommendation Comments on Treatment Session  Spouse present and very helpful.  Instructed both on use of cast shoe for L LE and WB thru L heel vs full foot.  Assisted pt OOB and amb in hallway. Pt tolerated session well.    Follow Up Recommendations  Home health PT     Does the patient have the potential to tolerate intense rehabilitation     Barriers to Discharge        Equipment Recommendations       Recommendations for Other Services    Frequency Min 4X/week   Plan Discharge plan remains appropriate    Precautions / Restrictions Precautions Precautions: Fall Other Brace/Splint: L post op cast shoe Restrictions Weight Bearing Restrictions: Yes Other Position/Activity Restrictions: WB thru L heel only    Pertinent Vitals/Pain No c/o pain    Mobility  Bed Mobility Bed Mobility: Supine to Sit Supine to Sit: 5: Supervision Details for Bed Mobility Assistance: increased time only  Transfers Transfers: Sit to Stand;Stand to Sit Sit to Stand: 5: Supervision;From bed Stand to Sit: 5: Supervision;To chair/3-in-1 Details for Transfer Assistance: one VC on safety with turn completion prior to sit  Ambulation/Gait Ambulation/Gait Assistance: 4: Min guard;5: Supervision Ambulation Distance (Feet): 85 Feet Assistive device: Rolling walker Ambulation/Gait Assistance Details: 75% VC's on proper sequencing using RW and to instruct on WB thru L heel vs full foot.  Spouse assisted by following with chair. Gait Pattern: Step-to pattern;Decreased stance time - left;Trunk flexed Gait velocity: decreased     PT Goals                 progressing    Visit Information  Last PT Received On: 09/18/12 Assistance Needed: +1                   End of Session PT - End of  Session Equipment Utilized During Treatment: Gait belt Activity Tolerance: Patient tolerated treatment well Patient left: in chair;with call bell/phone within reach;with family/visitor present Nurse Communication: Mobility status (staff observed)  Felecia Shelling  PTA [facility]  Acute  Rehab Pager     [phone]

## 2012-09-18 NOTE — Progress Notes (Signed)
[facility]  Patient Status: Active (receiving services up to time of hospitalization)  [facility] is providing the following services: RN and PT  If patient discharges after hours, please call [phone].   Please resume HH orders if pt returns home.   Thank you  Norberta Keens, RN, BSN [phone] 09/18/2012, 10:23 AM

## 2012-09-18 NOTE — Progress Notes (Signed)
Subjective: 2 Days Post-Op Procedure(s) (LRB): IRRIGATION AND DEBRIDEMENT EXTREMITY (Left) AMPUTATION RAY (Left) Patient reports pain as none.  No c/o.  Awaiting post op shoe or cam boot from ortho tech..    Objective: Vital signs in last 24 hours: Temp:  [98.4 F (36.9 C)] 98.4 F (36.9 C) (10/14 0613) Pulse Rate:  [62-66] 66  (10/14 0613) Resp:  [18] 18  (10/14 0613) BP: (139-159)/(53-66) 159/66 mmHg (10/14 0613) SpO2:  [92 %-96 %] 92 % (10/14 0613)  Intake/Output from previous day: 10/13 0701 - 10/14 0700 In: 3715 [P.O.:240; I.V.:2375; IV Piggyback:1100] Out: 3400 [Urine:3400] Intake/Output this shift:     Basename 09/18/12 0445 09/17/12 0555 09/16/12 0550  HGB 9.7* 9.6* 9.7*    Basename 09/18/12 0445 09/17/12 0555  WBC 4.3 5.3  RBC 2.94* 2.85*  HCT 28.3* 27.6*  PLT 81* 78*    Basename 09/18/12 0445 09/17/12 0555  NA 137 134*  K 3.9 4.0  CL 104 102  CO2 24 24  BUN 9 10  CREATININE 0.95 0.94  GLUCOSE 114* 92  CALCIUM 9.3 9.4   No results found for this basename: LABPT:2,INR:2 in the last 72 hours  Foot wound dressed and dry.  Assessment/Plan: 2 Days Post-Op Procedure(s) (LRB): IRRIGATION AND DEBRIDEMENT EXTREMITY (Left) AMPUTATION RAY (Left) Up with therapy once pt gets post op shoe or cam boot.  [name] 09/18/2012, 7:52 AM

## 2012-09-18 NOTE — Progress Notes (Signed)
Subjective: Not having significant left foot pain, eating well, no dyspnea or chest discomfort Objective: Vital signs in last 24 hours: Temp:  [98.4 F (36.9 C)] 98.4 F (36.9 C) (10/14 0613) Pulse Rate:  [62-66] 66  (10/14 0613) Resp:  [18] 18  (10/14 0613) BP: (139-159)/(53-66) 159/66 mmHg (10/14 0613) SpO2:  [92 %-96 %] 92 % (10/14 0613) Weight change:    Intake/Output from previous day: 10/13 0701 - 10/14 0700 In: 3715 [P.O.:240; I.V.:2375; IV Piggyback:1100] Out: 3400 [Urine:3400]   General appearance: alert, cooperative and no distress Resp: clear to auscultation bilaterally Cardio: regular rate and rhythm and with a 2/6 SEM Extremities: extremities normal, atraumatic, no cyanosis or edema  Lab Results:  Basename 09/18/12 0445 09/17/12 0555  WBC 4.3 5.3  HGB 9.7* 9.6*  HCT 28.3* 27.6*  PLT 81* 78*   BMET  Basename 09/18/12 0445 09/17/12 0555  NA 137 134*  K 3.9 4.0  CL 104 102  CO2 24 24  GLUCOSE 114* 92  BUN 9 10  CREATININE 0.95 0.94  CALCIUM 9.3 9.4   CMET CMP     Component Value Date/Time   NA 137 09/18/2012 0445   NA 138 08/21/2012 1308   K 3.9 09/18/2012 0445   K 3.3* 08/21/2012 1308   CL 104 09/18/2012 0445   CL 104 08/21/2012 1308   CO2 24 09/18/2012 0445   CO2 25 08/21/2012 1308   GLUCOSE 114* 09/18/2012 0445   GLUCOSE 182* 08/21/2012 1308   BUN 9 09/18/2012 0445   BUN 18.0 08/21/2012 1308   CREATININE 0.95 09/18/2012 0445   CREATININE 1.0 08/21/2012 1308   CALCIUM 9.3 09/18/2012 0445   CALCIUM 9.3 08/21/2012 1308   PROT 5.2* 09/18/2012 0445   PROT 5.7* 08/21/2012 1308   ALBUMIN 2.5* 09/18/2012 0445   ALBUMIN 3.5 08/21/2012 1308   AST 12 09/18/2012 0445   AST 16 08/21/2012 1308   ALT 8 09/18/2012 0445   ALT 12 08/21/2012 1308   ALKPHOS 119* 09/18/2012 0445   ALKPHOS 117 08/21/2012 1308   BILITOT 0.3 09/18/2012 0445   BILITOT 0.60 08/21/2012 1308   GFRNONAA 75* 09/18/2012 0445   GFRAA 87* 09/18/2012 0445    CBG (last 3)   Basename  09/17/12 1659 09/17/12 1243 09/17/12 0721  GLUCAP 103* 96 97    INR RESULTS:   Lab Results  Component Value Date   INR 1.09 09/08/2012   INR 1.05 03/29/2012   INR 1.13 02/14/2012     Studies/Results: No results found.  Medications: I have reviewed the patient's current medications.  Assessment/Plan: #1 Osteomyelitis: stable, and will plan on transition to po antibiotics tomorrow, probable discharge tomorrow. Will have PT evaluation of ambulation with a flat postop shoe today and if significantly unstable will plan on SNF rehab, otherwise discharge to home tomorrow with home health RN and PT. #2 DM2: stable on current meds. #3 CAD: stable with no angina sxs.  LOS: 5 days   Yvette Loveless G 09/18/2012, 7:47 AM

## 2012-09-18 NOTE — Progress Notes (Signed)
Chart review.  PT recommends HHPT.  No CSW needs identified.  Providence Crosby, LCSWA Clinical Social Work [phone]

## 2012-09-19 ENCOUNTER — Ambulatory Visit: Payer: Medicare Other

## 2012-09-19 LAB — COMPREHENSIVE METABOLIC PANEL
ALT: 8 U/L (ref 0–53)
AST: 12 U/L (ref 0–37)
Albumin: 2.6 g/dL — ABNORMAL LOW (ref 3.5–5.2)
BUN: 10 mg/dL (ref 6–23)
Creatinine, Ser: 0.98 mg/dL (ref 0.50–1.35)
GFR calc non Af Amer: 74 mL/min — ABNORMAL LOW (ref >90)
Glucose, Bld: 107 mg/dL — ABNORMAL HIGH (ref 70–99)
Total Bilirubin: 0.3 mg/dL (ref 0.3–1.2)

## 2012-09-19 LAB — CBC
HCT: 28 % — ABNORMAL LOW (ref 39.0–52.0)
Hemoglobin: 9.7 g/dL — ABNORMAL LOW (ref 13.0–17.0)
MCH: 33.3 pg (ref 26.0–34.0)
MCHC: 34.6 g/dL (ref 30.0–36.0)
MCV: 96.2 fL (ref 78.0–100.0)
Platelets: 81 K/uL — ABNORMAL LOW (ref 150–400)
RBC: 2.91 MIL/uL — ABNORMAL LOW (ref 4.22–5.81)
RDW: 14.4 % (ref 11.5–15.5)
WBC: 4.2 K/uL (ref 4.0–10.5)

## 2012-09-19 LAB — GLUCOSE, CAPILLARY
Glucose-Capillary: 143 mg/dL — ABNORMAL HIGH (ref 70–99)
Glucose-Capillary: 143 mg/dL — ABNORMAL HIGH (ref 70–99)
Glucose-Capillary: 114 mg/dL — ABNORMAL HIGH (ref 70–99)
Glucose-Capillary: 119 mg/dL — ABNORMAL HIGH (ref 70–99)
Glucose-Capillary: 195 mg/dL — ABNORMAL HIGH (ref 70–99)
Glucose-Capillary: 109 mg/dL — ABNORMAL HIGH (ref 70–99)

## 2012-09-19 LAB — CULTURE, BLOOD (ROUTINE X 2): Culture: NO GROWTH

## 2012-09-19 MED ORDER — HYDROXYZINE HCL 25 MG PO TABS: 25.0000 mg | ORAL_TABLET | Freq: Three times a day (TID) | ORAL | Status: DC

## 2012-09-19 MED ORDER — DOXYCYCLINE HYCLATE 100 MG PO TABS
100.0000 mg | ORAL_TABLET | Freq: Two times a day (BID) | ORAL | Status: DC
  Administered 2012-09-19: 100 mg via ORAL
  Filled 2012-09-19 (×2): qty 1

## 2012-09-19 MED ORDER — FUROSEMIDE 40 MG PO TABS: 40.0000 mg | ORAL_TABLET | Freq: Every day | ORAL | Status: DC

## 2012-09-19 NOTE — Progress Notes (Signed)
OT Cancellation Note  Patient Details Name: [name] MRN: [id number] DOB: October 17, 1927   Cancelled Treatment:    Reason Eval/Treat Not Completed: Other (comment) (pt/family state no OT needs. supposed to d/c today) Wife has assisted to dress pt this am and pt reports already being up to the bathroom with assist without difficulty. They decline need to practice ADL further.  Lennox Laity [phone] 09/19/2012, 11:38 AM

## 2012-09-19 NOTE — Discharge Summary (Signed)
Physician Discharge Summary  Patient ID: [name] MRN: [id number] DOB/AGE: [birth year] 78 y.o.  Admit date: 09/13/2012 Discharge date: 09/19/2012   Discharge Diagnoses:  Principal Problem:  *Sepsis(995.91) Active Problems:  Cellulitis of foot  Cellulitis  Peripheral arterial disease  DIABETES MELLITUS, TYPE II  Neuralgia  HYPERTENSION   Discharged Condition: good  Hospital Course: The patient is an 78 year old Caucasian man is medical problems who was sent to the emergency room from the wound center because of fever associated with a left foot infection. About a month prior to hospital admission he presented to our office with a blister in the region of the left first metatarsophalangeal joint associated with wearing shoes it did not fit well. At that time the blister was superficial was debrided and history with doxycycline 100 mg twice daily for 10 days and I'll soap soaks twice daily. He had a followup appointment scheduled, but did not keep the appointment because his foot was improving. However, he subsequently noted increasing erythema of the dorsal aspect of the left foot adjacent to the left great toe and presented to the emergency room on 09/07/2012 with a temperature of 103F. He was admitted by the hospitalist service and started on Vancomycin and Zosyn. X-rays of the left foot did not show evidence of fracture, soft tissue gas, or osteomyelitis. An MRI exam of the left foot did not show evidence of osteomyelitis or abscess. A chest x-ray showed slight increase in chronic infiltrates in the right lower lobe. He did well with IV antibiotics and was transitioned to Augmentin and discharged home on October 5. On the day of hospital readmission he had developed increasing erythema along the distal left foot adjacent to the great toe along with a temperature of 102F when seen at the wound center. The wound was debrided somewhat and he was sent to the emergency room for evaluation.  He did not have significant drainage from the wound, and only minimal pain. He does have a history of diabetes mellitus, type II with peripheral neuropathy, however. His review of systems was otherwise positive for a mildly productive cough without significant sinus congestion, shortness of breath, nausea, vomiting, abdominal pain, diarrhea, constipation, dysuria, or frequency. His medical history is also significant for coronary artery disease status post coronary artery bypass graft surgery, diabetes mellitus, type II that has been well-controlled and associated with atherosclerotic peripheral vascular disease and peripheral neuropathy, a relatively recent diagnosis of mantle cell lymphoma with no recent chemotherapy, and postherpetic neuralgia in the right face. He also has a history of moderate aortic stenosis.  He was restarted on IV vancomycin and Zosyn. He had x-rays of the left foot taken that showed no evidence of osteomyelitis. He was seen by an orthopedic and foot specialist, Dr. [name], who recommended surgical debridement. On 09/16/2012 he had surgical exploration of the wound that showed osteomyelitis of the left medial sesamoid and pyarthrosis and abscess of the left first metatarsophalangeal joint. 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 He did well postoperatively and was continued on IV antibiotics until the day of discharge. He was followed closely by a physical therapist who felt that his gait was stable with the use of a walker and standby assistance from his wife. His blood glucose levels were quite stable in the hospital using sliding scale insulin. There were no complications during his hospitalization. The plan is to discharge him home on a short course of doxycycline, have home  health nursing and physical therapy followup, and followup appointments with his primary care physician and orthopedic surgeon within the next 1 to 2  weeks following discharge.   Consults: orthopedic surgery  Significant Diagnostic Studies:  No results found.  Labs: Lab Results  Component Value Date   WBC 4.2 09/19/2012   HGB 9.7* 09/19/2012   HCT 28.0* 09/19/2012   MCV 96.2 09/19/2012   PLT 81* 09/19/2012     Lab 09/19/12 0450  NA 137  K 3.6  CL 103  CO2 26  BUN 10  CREATININE 0.98  CALCIUM 9.4  PROT 5.3*  BILITOT 0.3  ALKPHOS 119*  ALT 8  AST 12  GLUCOSE 107*       Lab Results  Component Value Date   INR 1.09 09/08/2012   INR 1.05 03/29/2012   INR 1.13 02/14/2012     Recent Results (from the past 240 hour(s))  CULTURE, BLOOD (ROUTINE X 2)     Status: Normal   Collection Time   09/13/12  3:40 PM      Component Value Range Status Comment   Specimen Description BLOOD RIGHT ARM   Final    Special Requests BOTTLES DRAWN AEROBIC AND ANAEROBIC 5CC   Final    Culture  Setup Time 09/13/2012 21:13   Final    Culture NO GROWTH 5 DAYS   Final    Report Status 09/19/2012 FINAL   Final   CULTURE, BLOOD (ROUTINE X 2)     Status: Normal   Collection Time   09/13/12  5:05 PM      Component Value Range Status Comment   Specimen Description BLOOD RIGHT HAND   Final    Special Requests BOTTLES DRAWN AEROBIC AND ANAEROBIC 5CC   Final    Culture  Setup Time 09/13/2012 21:13   Final    Culture NO GROWTH 5 DAYS   Final    Report Status 09/19/2012 FINAL   Final   SURGICAL PCR SCREEN     Status: Normal   Collection Time   09/16/12  4:03 AM      Component Value Range Status Comment   MRSA, PCR NEGATIVE  NEGATIVE Final    Staphylococcus aureus NEGATIVE  NEGATIVE Final   TISSUE CULTURE     Status: Normal   Collection Time   09/16/12  8:26 AM      Component Value Range Status Comment   Specimen Description FOOT LEFT FORE FOOT DIABETIC ULCER   Final    Special Requests NONE   Final    Gram Stain     Final    Value: NO WBC SEEN     NO SQUAMOUS EPITHELIAL CELLS SEEN     NO ORGANISMS SEEN   Culture NO GROWTH 2 DAYS   Final     Report Status 09/18/2012 FINAL   Final   ANAEROBIC CULTURE     Status: Normal (Preliminary result)   Collection Time   09/16/12  8:26 AM      Component Value Range Status Comment   Specimen Description FOOT LEFT FORE FOOT DIABETIC ULCER   Final    Special Requests NONE   Final    Gram Stain PENDING   Incomplete    Culture     Final    Value: NO ANAEROBES ISOLATED; CULTURE IN PROGRESS FOR 5 DAYS   Report Status PENDING   Incomplete       Discharge Exam: Blood pressure 166/65, pulse 63, temperature 98.4 F (36.9  C), temperature source Oral, resp. rate 18, height 6\' 1"  (1.854 m), weight 86.2 kg (190 lb 0.6 oz), SpO2 95.00%.  Physical Exam: In general, the patient is an elderly white man who was in no apparent distress was sitting upright in bed. HEENT exam was within normal limits, and his neck had a healing transverse incision in the posterior left side from recent dermatologic surgery, with the incision and held by sutures and showing no signs of infection (sutures to be removed today). Chest was clear to auscultation, heart had a regular rate and rhythm with a systolic ejection murmur grade 2/6 at left sternal border, abdomen was benign, extremities were without edema, the left foot is wrapped with a bulky dressing and there is intact toe tip capillary refill and sensation.  Disposition: He'll be discharged to home in the company of his wife. He will be provided with a lightweight walker upon discharge. Home health nursing and physical therapy will also followup with him at home. He'll have followup appointments with Dr. [name] and Dr. [name] as described below.  Discharge Orders    Future Appointments: Provider: Department: Dept Phone: Center:   10/16/2012 9:45 AM Sherrie Mustache [facility] [phone] None   10/16/2012 10:30 AM [facility] [phone] [facility]   10/18/2012 8:00 AM [facility] [phone] [facility]    10/23/2012 2:30 PM Levert Feinstein, MD [facility] [phone] None     Future Orders Please Complete By Expires   Ambulatory referral to Home Health      Comments:   Please evaluate TARELL SCHOLLMEYER for admission to [facility].  Disciplines requested: Nursing and Physical Therapy  Services to provide: [facility] Care and Strengthening Exercises  Physician to follow patient's care (the person listed here will be responsible for signing ongoing orders): PCP  Requested Start of Care Date: Tomorrow  Special Instructions:  He will need continued care of his left foot first ray amputation and PT for gait training. His surgeon is Dr. [name].   Diet - low sodium heart healthy      Care order/instruction      Comments:   Please remove sutures from left posterior neck, first painting sutures with betadine, then apply steristrips covered by gauze over the wound.   Increase activity slowly      Discharge instructions      Comments:   He'll be discharged to home today in the company of his wife. Arrangements have been made to have home health nursing and physical therapy. He should call [phone] tomorrow to set up a office visit within 2 weeks following hospital discharge. He should also call Dr. Jonny Ruiz Hewitt's office to set up a followup appointment per his recommendations.   Discharge wound care:      Comments:   Per Dr. [name] in coordination with home health nursing.   Call MD for:      Comments:   Call physician for development of fever, chills, increasing left foot pain, difficulty breathing, chest pain, or other worrisome symptoms.       Medication List     As of 09/19/2012  8:32 AM    STOP taking these medications         amoxicillin-clavulanate 875-125 MG per tablet   Commonly known as: AUGMENTIN      TAKE these medications         acyclovir 400 MG tablet   Commonly known as: ZOVIRAX  Take 400 mg by mouth 2 (two) times daily.      aspirin EC 81 MG  tablet   Take 81 mg by mouth every morning.      furosemide 40 MG tablet   Commonly known as: LASIX   Take 1 tablet (40 mg total) by mouth daily. Fluid      gabapentin 300 MG capsule   Commonly known as: NEURONTIN   Take 300-600 mg by mouth 3 (three) times daily. TAKES 2 CAPSULES IN THE MORNING AND 2 AT LUNCH AND 1 AT NIGHT      hydrOXYzine 25 MG tablet   Commonly known as: ATARAX/VISTARIL   Take 1 tablet (25 mg total) by mouth 3 (three) times daily.      isosorbide mononitrate 30 MG 24 hr tablet   Commonly known as: IMDUR   Take 1 tablet (30 mg total) by mouth daily.      losartan 100 MG tablet   Commonly known as: COZAAR   Take 1 tablet by mouth daily.      meloxicam 15 MG tablet   Commonly known as: MOBIC   Take 15 mg by mouth daily.      metoprolol succinate 25 MG 24 hr tablet   Commonly known as: TOPROL-XL   Take 25 mg by mouth every morning.      ondansetron 8 MG disintegrating tablet   Commonly known as: ZOFRAN-ODT   Place 8 mg under the tongue every 8 (eight) hours as needed. For nausea.      pravastatin 40 MG tablet   Commonly known as: PRAVACHOL   Take 40 mg by mouth daily.      vitamin B-12 500 MCG tablet   Commonly known as: CYANOCOBALAMIN   Take 500 mcg by mouth daily.           Follow-up Information    Follow up with Garlan Fillers, MD. Schedule an appointment as soon as possible for a visit in 1 week.   Contact information:   [address] [facility] [place] [address] [phone]       Follow up with [name], MD. Schedule an appointment as soon as possible for a visit in 2 weeks.   Contact information:   [address], [address] [place] [address] [phone]          Signed: Garlan Fillers 09/19/2012, 8:32 AM

## 2012-09-19 NOTE — Progress Notes (Signed)
Physical Therapy Treatment Patient Details Name: [name] MRN: [id number] DOB: 01-20-1927 Today's Date: 09/19/2012 Time: 4098-1191 PT Time Calculation (min): 18 min  PT Assessment / Plan / Recommendation Comments on Treatment Session  Family present during session.  Assisted pt out of recliner to amb to one step. Practiced going up and down one step forward using RW.  Pt and family educated on WB thru L heel, to wear cast shoe all times for amb but can take off for night.  Educated on importance of elevation. Educated on proper tech to get in/ot car.Pt plans to D/C to home today with family.    Follow Up Recommendations  Home health PT     Does the patient have the potential to tolerate intense rehabilitation     Barriers to Discharge        Equipment Recommendations       Recommendations for Other Services    Frequency Min 4X/week   Plan Discharge plan remains appropriate    Precautions / Restrictions Precautions Precautions: Fall Other Brace/Splint: L post op cast shoe  Restrictions Weight Bearing Restrictions: Yes Other Position/Activity Restrictions: WB thru L heel only    Pertinent Vitals/Pain No c/o pain    Mobility  Bed Mobility Bed Mobility: Not assessed Details for Bed Mobility Assistance: Pt OOB in recliner  Transfers Transfers: Sit to Stand;Stand to Sit Sit to Stand: 5: Supervision;From chair/3-in-1 Stand to Sit: 5: Supervision;To chair/3-in-1 Details for Transfer Assistance: one VC on safety with turn completion prior to sit   Ambulation/Gait Ambulation/Gait Assistance: 4: Min guard Ambulation Distance (Feet): 45 Feet Assistive device: Rolling walker Ambulation/Gait Assistance Details: 50% VC's on proper sequencing using RW and to instruct on WB thru L heel vs full foot Gait Pattern: Step-to pattern;Decreased stance time - left;Trunk flexed Gait velocity: decreased  Stairs: Yes Stairs Assistance: 4: Min assist Stairs Assistance Details  (indicate cue type and reason): with family, demonstarted and educated Stair Management Technique: No rails;Forwards;With walker Number of Stairs: 1     PT Goals progressing    Visit Information  Last PT Received On: 09/19/12 Assistance Needed: +1                   End of Session PT - End of Session Equipment Utilized During Treatment: Gait belt Activity Tolerance: Patient tolerated treatment well Patient left: in chair;with call bell/phone within reach;with family/visitor present Nurse Communication: Mobility status  Felecia Shelling  PTA [facility]  Acute  Rehab Pager     [phone]

## 2012-09-21 LAB — ANAEROBIC CULTURE: Gram Stain: NONE SEEN

## 2012-09-25 ENCOUNTER — Telehealth: Payer: Self-pay | Admitting: *Deleted

## 2012-09-25 ENCOUNTER — Other Ambulatory Visit: Payer: Self-pay | Admitting: Oncology

## 2012-09-25 NOTE — Telephone Encounter (Signed)
Per desk RN I have rescheduled appts from last week to this week. JMW

## 2012-09-25 NOTE — Telephone Encounter (Signed)
Received call from pt wife asking "my husband's chemo was cancelled 10/7 and 10/8 because he went to the hospital; had his left big toe taken off due to cellulitis and is out now.  When does Dr. Cyndie Chime want to re-schedule him for chemo treatment again?"  Note to Dr. Cyndie Chime.    1320 Called and spoke with pt wife; per Dr. Cyndie Chime need to have pt scheduled for chemo this week.  Informed wife pt is scheduled Thursday 09/28/12 at 2:45 lab then chemo at 3:15.  Friday 09/29/12 chemo appt at 9:30am.  Pt wife verbalized understanding and expressed appreciation for call.

## 2012-09-28 ENCOUNTER — Ambulatory Visit (HOSPITAL_BASED_OUTPATIENT_CLINIC_OR_DEPARTMENT_OTHER): Payer: Medicare Other

## 2012-09-28 ENCOUNTER — Telehealth: Payer: Self-pay | Admitting: *Deleted

## 2012-09-28 ENCOUNTER — Other Ambulatory Visit (HOSPITAL_BASED_OUTPATIENT_CLINIC_OR_DEPARTMENT_OTHER): Payer: Medicare Other | Admitting: Lab

## 2012-09-28 VITALS — BP 128/51 | HR 53 | Temp 97.1°F | Resp 18

## 2012-09-28 DIAGNOSIS — C8313 Mantle cell lymphoma, intra-abdominal lymph nodes: Secondary | ICD-10-CM

## 2012-09-28 DIAGNOSIS — C8589 Other specified types of non-Hodgkin lymphoma, extranodal and solid organ sites: Secondary | ICD-10-CM

## 2012-09-28 DIAGNOSIS — Z5111 Encounter for antineoplastic chemotherapy: Secondary | ICD-10-CM

## 2012-09-28 DIAGNOSIS — D696 Thrombocytopenia, unspecified: Secondary | ICD-10-CM

## 2012-09-28 DIAGNOSIS — C8590 Non-Hodgkin lymphoma, unspecified, unspecified site: Secondary | ICD-10-CM

## 2012-09-28 LAB — CBC WITH DIFFERENTIAL/PLATELET
Eosinophils Absolute: 0.1 10*3/uL (ref 0.0–0.5)
HCT: 34.5 % — ABNORMAL LOW (ref 38.4–49.9)
LYMPH%: 18.7 % (ref 14.0–49.0)
MCV: 99.7 fL — ABNORMAL HIGH (ref 79.3–98.0)
MONO#: 0.9 10*3/uL (ref 0.1–0.9)
MONO%: 10.6 % (ref 0.0–14.0)
NEUT#: 5.7 10*3/uL (ref 1.5–6.5)
NEUT%: 69.5 % (ref 39.0–75.0)
Platelets: 131 10*3/uL — ABNORMAL LOW (ref 140–400)
RBC: 3.46 10*6/uL — ABNORMAL LOW (ref 4.20–5.82)
WBC: 8.1 10*3/uL (ref 4.0–10.3)
nRBC: 0 % (ref 0–0)

## 2012-09-28 LAB — COMPREHENSIVE METABOLIC PANEL (CC13)
ALT: 13 U/L (ref 0–55)
Alkaline Phosphatase: 101 U/L (ref 40–150)
CO2: 23 mEq/L (ref 22–29)
Creatinine: 1 mg/dL (ref 0.7–1.3)
Glucose: 168 mg/dl — ABNORMAL HIGH (ref 70–99)
Sodium: 140 mEq/L (ref 136–145)
Total Bilirubin: 0.5 mg/dL (ref 0.20–1.20)
Total Protein: 6.1 g/dL — ABNORMAL LOW (ref 6.4–8.3)

## 2012-09-28 MED ORDER — ONDANSETRON 8 MG/50ML IVPB (CHCC)
8.0000 mg | Freq: Once | INTRAVENOUS | Status: AC
  Administered 2012-09-28: 8 mg via INTRAVENOUS

## 2012-09-28 MED ORDER — SODIUM CHLORIDE 0.9 % IV SOLN
70.0000 mg/m2 | Freq: Once | INTRAVENOUS | Status: AC
  Administered 2012-09-28: 155 mg via INTRAVENOUS
  Filled 2012-09-28: qty 31

## 2012-09-28 MED ORDER — DEXAMETHASONE SODIUM PHOSPHATE 10 MG/ML IJ SOLN
10.0000 mg | Freq: Once | INTRAMUSCULAR | Status: AC
  Administered 2012-09-28: 10 mg via INTRAVENOUS

## 2012-09-28 MED ORDER — SODIUM CHLORIDE 0.9 % IV SOLN
Freq: Once | INTRAVENOUS | Status: AC
  Administered 2012-09-28: 16:00:00 via INTRAVENOUS

## 2012-09-28 NOTE — Patient Instructions (Addendum)
[facility] Discharge Instructions for Patients Receiving Chemotherapy  Today you received the following chemotherapy agents Treanda.  To help prevent nausea and vomiting after your treatment, we encourage you to take your nausea medication.   If you develop nausea and vomiting that is not controlled by your nausea medication, call the clinic. If it is after clinic hours your family physician or the after hours number for the clinic or go to the Emergency Department.   BELOW ARE SYMPTOMS THAT SHOULD BE REPORTED IMMEDIATELY:  *FEVER GREATER THAN 100.5 F  *CHILLS WITH OR WITHOUT FEVER  NAUSEA AND VOMITING THAT IS NOT CONTROLLED WITH YOUR NAUSEA MEDICATION  *UNUSUAL SHORTNESS OF BREATH  *UNUSUAL BRUISING OR BLEEDING  TENDERNESS IN MOUTH AND THROAT WITH OR WITHOUT PRESENCE OF ULCERS  *URINARY PROBLEMS  *BOWEL PROBLEMS  UNUSUAL RASH Items with * indicate a potential emergency and should be followed up as soon as possible.  One of the nurses will contact you 24 hours after your treatment. Please let the nurse know about any problems that you may have experienced. Feel free to call the clinic you have any questions or concerns. The clinic phone number is [phone].   I have been informed and understand all the instructions given to me. I know to contact the clinic, my physician, or go to the Emergency Department if any problems should occur. I do not have any questions at this time, but understand that I may call the clinic during office hours   should I have any questions or need assistance in obtaining follow up care.    __________________________________________  _____________  __________ Signature of Patient or Authorized Representative            Date                   Time    __________________________________________ Nurse's Signature    

## 2012-09-28 NOTE — Telephone Encounter (Signed)
Noted BUN elevated @ 45.  Discussed with Dr Truett Perna & pt informed to stop lasix & push PO fluids & we will draw B-met tomorrow.  Pt & wife expressed understanding.

## 2012-09-29 ENCOUNTER — Ambulatory Visit (HOSPITAL_BASED_OUTPATIENT_CLINIC_OR_DEPARTMENT_OTHER): Payer: Medicare Other | Admitting: Lab

## 2012-09-29 ENCOUNTER — Other Ambulatory Visit: Payer: Self-pay | Admitting: *Deleted

## 2012-09-29 ENCOUNTER — Ambulatory Visit (HOSPITAL_BASED_OUTPATIENT_CLINIC_OR_DEPARTMENT_OTHER): Payer: Medicare Other

## 2012-09-29 VITALS — BP 151/62 | HR 50 | Temp 97.2°F | Resp 18

## 2012-09-29 DIAGNOSIS — C8313 Mantle cell lymphoma, intra-abdominal lymph nodes: Secondary | ICD-10-CM

## 2012-09-29 DIAGNOSIS — Z5111 Encounter for antineoplastic chemotherapy: Secondary | ICD-10-CM

## 2012-09-29 DIAGNOSIS — C8590 Non-Hodgkin lymphoma, unspecified, unspecified site: Secondary | ICD-10-CM

## 2012-09-29 DIAGNOSIS — D696 Thrombocytopenia, unspecified: Secondary | ICD-10-CM

## 2012-09-29 LAB — BASIC METABOLIC PANEL (CC13)
BUN: 39 mg/dL — ABNORMAL HIGH (ref 7.0–26.0)
CO2: 23 mEq/L (ref 22–29)
Calcium: 9.9 mg/dL (ref 8.4–10.4)
Chloride: 108 mEq/L — ABNORMAL HIGH (ref 98–107)
Creatinine: 1 mg/dL (ref 0.7–1.3)
Glucose: 136 mg/dl — ABNORMAL HIGH (ref 70–99)

## 2012-09-29 MED ORDER — SODIUM CHLORIDE 0.9 % IV SOLN
70.0000 mg/m2 | Freq: Once | INTRAVENOUS | Status: AC
  Administered 2012-09-29: 155 mg via INTRAVENOUS
  Filled 2012-09-29: qty 31

## 2012-09-29 MED ORDER — ONDANSETRON 8 MG/50ML IVPB (CHCC)
8.0000 mg | Freq: Once | INTRAVENOUS | Status: AC
  Administered 2012-09-29: 8 mg via INTRAVENOUS

## 2012-09-29 MED ORDER — SODIUM CHLORIDE 0.9 % IV SOLN
Freq: Once | INTRAVENOUS | Status: AC
  Administered 2012-09-29: 10:00:00 via INTRAVENOUS

## 2012-09-29 MED ORDER — DEXAMETHASONE SODIUM PHOSPHATE 10 MG/ML IJ SOLN
10.0000 mg | Freq: Once | INTRAMUSCULAR | Status: AC
  Administered 2012-09-29: 10 mg via INTRAVENOUS

## 2012-09-29 NOTE — Patient Instructions (Signed)
[facility] Discharge Instructions for Patients Receiving Chemotherapy  Today you received the following chemotherapy agents: Treanda. To help prevent nausea and vomiting after your treatment, we encourage you to take your nausea medication, Zofran. Begin taking it at bedtime and take it as often as prescribed for the next 48 hours.  If you develop nausea and vomiting that is not controlled by your nausea medication, call the clinic. If it is after clinic hours your family physician or the after hours number for the clinic or go to the Emergency Department.   BELOW ARE SYMPTOMS THAT SHOULD BE REPORTED IMMEDIATELY:  *FEVER GREATER THAN 100.5 F  *CHILLS WITH OR WITHOUT FEVER  NAUSEA AND VOMITING THAT IS NOT CONTROLLED WITH YOUR NAUSEA MEDICATION  *UNUSUAL SHORTNESS OF BREATH  *UNUSUAL BRUISING OR BLEEDING  TENDERNESS IN MOUTH AND THROAT WITH OR WITHOUT PRESENCE OF ULCERS  *URINARY PROBLEMS  *BOWEL PROBLEMS  UNUSUAL RASH Items with * indicate a potential emergency and should be followed up as soon as possible.  One of the nurses will contact you 24 hours after your treatment. Please let the nurse know about any problems that you may have experienced. Feel free to call the clinic you have any questions or concerns. The clinic phone number is [phone].   I have been informed and understand all the instructions given to me. I know to contact the clinic, my physician, or go to the Emergency Department if any problems should occur. I do not have any questions at this time, but understand that I may call the clinic during office hours   should I have any questions or need assistance in obtaining follow up care.    __________________________________________  _____________  __________ Signature of Patient or Authorized Representative            Date                   Time    __________________________________________ Nurse's Signature

## 2012-10-06 NOTE — Progress Notes (Signed)
[facility]  NAME:  [name], [name] NO.:  [id number]  MEDICAL RECORD NO.:  [id number]      DATE OF BIRTH:  08/22/1927  PHYSICIAN:  Ardath Sax, M.D.           VISIT DATE:                                  OFFICE VISIT   He comes here because of an abscess on first MP joint area of his left foot.  He was recently treated at [facility] with IV antibiotics.  Today, I saw him, he had great deal of necrotic tissue and purulent material coming out of this ulcer that went right down to the bone.  After I did an I and D, left a drain and we sent him to the emergency department for admission and a surgical consultation.  He was afebrile with a blood pressure of 140/80, a pulse of 80, and he did complain of a great deal of pain.  So in light of his history of having had this abscess treated really unsuccessfully and the fact he is a diabetic and the fact that he has non-Hodgkin lymphoma and has been treated with chemotherapy fairly recently, we sent him for a surgical consultation immediately.     Ardath Sax, M.D.     PP/MEDQ  D:  10/06/2012  T:  10/06/2012  Job:  [id number]

## 2012-10-16 ENCOUNTER — Ambulatory Visit (HOSPITAL_COMMUNITY)
Admission: RE | Admit: 2012-10-16 | Discharge: 2012-10-16 | Disposition: A | Discharge status: Home / Self Care | Payer: Medicare Other | Source: Ambulatory Visit | Attending: Nurse Practitioner | Admitting: Nurse Practitioner

## 2012-10-16 ENCOUNTER — Other Ambulatory Visit (HOSPITAL_BASED_OUTPATIENT_CLINIC_OR_DEPARTMENT_OTHER): Payer: Medicare Other | Admitting: Lab

## 2012-10-16 DIAGNOSIS — C8313 Mantle cell lymphoma, intra-abdominal lymph nodes: Secondary | ICD-10-CM | POA: Insufficient documentation

## 2012-10-16 DIAGNOSIS — N200 Calculus of kidney: Secondary | ICD-10-CM | POA: Insufficient documentation

## 2012-10-16 DIAGNOSIS — R161 Splenomegaly, not elsewhere classified: Secondary | ICD-10-CM | POA: Insufficient documentation

## 2012-10-16 LAB — CBC WITH DIFFERENTIAL/PLATELET
BASO%: 0.4 % (ref 0.0–2.0)
Basophils Absolute: 0 10*3/uL (ref 0.0–0.1)
EOS%: 0.4 % (ref 0.0–7.0)
MCH: 33.2 pg (ref 27.2–33.4)
MCHC: 33.7 g/dL (ref 32.0–36.0)
MCV: 98.6 fL — ABNORMAL HIGH (ref 79.3–98.0)
MONO%: 7.1 % (ref 0.0–14.0)
RBC: 3.46 10*6/uL — ABNORMAL LOW (ref 4.20–5.82)
RDW: 16.1 % — ABNORMAL HIGH (ref 11.0–14.6)
lymph#: 0.2 10*3/uL — ABNORMAL LOW (ref 0.9–3.3)

## 2012-10-16 LAB — COMPREHENSIVE METABOLIC PANEL (CC13)
ALT: 19 U/L (ref 0–55)
AST: 21 U/L (ref 5–34)
Albumin: 3.8 g/dL (ref 3.5–5.0)
Alkaline Phosphatase: 116 U/L (ref 40–150)
BUN: 22 mg/dL (ref 7.0–26.0)
Chloride: 107 mEq/L (ref 98–107)
Potassium: 4.2 mEq/L (ref 3.5–5.1)
Sodium: 137 mEq/L (ref 136–145)

## 2012-10-17 ENCOUNTER — Telehealth: Payer: Self-pay | Admitting: *Deleted

## 2012-10-17 NOTE — Telephone Encounter (Signed)
Message copied by Kathlynn Grate on Tue Oct 17, 2012  4:47 PM ------      Message from: [place], [place]      Created: Tue Oct 17, 2012  2:54 PM                   ----- Message -----         From: Levert Feinstein, MD         Sent: 10/16/2012   8:12 PM           To: Orbie Hurst, RN, Sabino Snipes, RN, #            Call pt - scans look good - remission of lymphoma

## 2012-10-17 NOTE — Telephone Encounter (Signed)
Called and spoke with wife, per Dr Reece Agar, lymphoma in remission according to scans. Continue to keep MD visit on Monday 10/23/12

## 2012-10-17 NOTE — Telephone Encounter (Signed)
Rec'd a message from home health nurse, Joya San,  wanting to report that patient has had a recent rash outbreak, fine, red rash on upper arms and upper trunk area. The dermatologist has been seen and some treatment has started, however, patient has some itching associated with this and is causing more, easier bruising. Zella Ball would like to report this in the event Dr Cyndie Chime would like to repeat any labs on patient when he sees patient on 10/23/12. Called back, left message that we did get her message and I would forward this information to Dr Cyndie Chime for any change in plans of care or extra labs that may want to be ordered at time of visit.  Note patient labs yesterday were at 64K.

## 2012-10-18 ENCOUNTER — Encounter (HOSPITAL_BASED_OUTPATIENT_CLINIC_OR_DEPARTMENT_OTHER): Payer: Medicare Other | Attending: General Surgery

## 2012-10-23 ENCOUNTER — Telehealth: Payer: Self-pay | Admitting: Oncology

## 2012-10-23 ENCOUNTER — Other Ambulatory Visit: Payer: Self-pay | Admitting: *Deleted

## 2012-10-23 ENCOUNTER — Ambulatory Visit (HOSPITAL_BASED_OUTPATIENT_CLINIC_OR_DEPARTMENT_OTHER): Payer: Medicare Other | Admitting: Oncology

## 2012-10-23 VITALS — BP 139/69 | HR 65 | Temp 98.3°F | Resp 20 | Ht 73.0 in | Wt 181.5 lb

## 2012-10-23 DIAGNOSIS — L309 Dermatitis, unspecified: Secondary | ICD-10-CM

## 2012-10-23 DIAGNOSIS — D6959 Other secondary thrombocytopenia: Secondary | ICD-10-CM

## 2012-10-23 DIAGNOSIS — C8313 Mantle cell lymphoma, intra-abdominal lymph nodes: Secondary | ICD-10-CM

## 2012-10-23 DIAGNOSIS — R161 Splenomegaly, not elsewhere classified: Secondary | ICD-10-CM

## 2012-10-23 MED ORDER — METHYLPREDNISOLONE 4 MG PO KIT: PACK | ORAL | Status: DC

## 2012-10-23 NOTE — Patient Instructions (Signed)
Start Rituxan antibody therapy on November 06, 2012  Weekly treatment foe 4 weeks in a row Visit with Nurse practitioner on Monday 12/9  Medrol: steroid dose pack for rash: use as directed

## 2012-10-23 NOTE — Telephone Encounter (Signed)
gv pt wife appt schedule for December. Due no availability to start tx 12/2 and pt can't do 12/3 per JG ok to move tx to wednesdays qw *4 to start 12/4. Pt will do last tx 12/26 as 12/25 is a holiday. JG aware.

## 2012-10-24 NOTE — Progress Notes (Signed)
Hematology and Oncology Follow Up Visit  [name] [id number] 05-Apr-1927 78 y.o. 10/24/2012 9:16 AM   Principle Diagnosis: Encounter Diagnoses  Name Primary?  . Mantle cell lymphoma of intra-abdominal lymph nodes Yes  . Splenomegaly   . Dermatitis      Interim History:   Mr. Has now completed 6 cycles of planned single agent chemotherapy with Bendamustine for mantle cell lymphoma diagnosed in April of this she when he presented with pancytopenia and splenomegaly. CT scan showed in addition periportal, retroperitoneal, and iliac lymphadenopathy. Bone marrow biopsy not done at his request. He had significant constitutional symptoms with progressive weight loss that was initially attributed to his underlying cardiac condition. Overall clinical status was tenuous at time of initiation of treatment. He has done surprisingly well with a significant clinical stabilization. He has tolerated the chemotherapy program well. He had a single brief admission mid course for pneumonitis. He was restaged in anticipation of today's visit and I personally reviewed the CT images. He has had a nice partial response. Unfortunately, radiologist did not compare current lymph nodes to the pretreatment study done back in April. There is persistent splenomegaly down from 18 cm in April to 16 cm. All lymph gland groups are less prominent. Dominant periportal lymph node initially 3.9 x 3.1 cm on April 16, currently measures 1.1 x 2 cm.  2.2 x 3.2 cm node anterior to the IVC, gastrohepatic ligament nodes over an area of 2.7 x 4.8 cm on the pretreatment study are not mentioned on the current study. Presumably they have resolved. 8mm spiculated lesion right upper lung stable. No adenopathy in the thorax.  Medications: reviewed  Allergies:  Allergies  Allergen Reactions  . Azithromycin Itching  . Macrodantin Itching  . Minocycline Hcl Itching  . Nitrofurantoin Itching  . Zithromax (Azithromycin Dihydrate) Itching    . Contrast Media (Iodinated Diagnostic Agents) Rash    Review of Systems: Constitutional:   No constitutional symptoms at present Respiratory: No cough or dyspnea Cardiovascular:  No chest pain or palpitations Gastrointestinal: No abdominal pain no change in bowel habit Genito-Urinary: No urinary tract symptoms Musculoskeletal: No muscle or bone pain Neurologic: No headache or change in vision Skin: No rash or ecchymosis Remaining ROS negative.  Physical Exam: Blood pressure 139/69, pulse 65, temperature 98.3 F (36.8 C), temperature source Oral, resp. rate 20, height 6\' 1"  (1.854 m), weight 181 lb 8 oz (82.328 kg). Wt Readings from Last 3 Encounters:  10/23/12 181 lb 8 oz (82.328 kg)  09/13/12 190 lb 0.6 oz (86.2 kg)  09/13/12 190 lb 0.6 oz (86.2 kg)     General appearance: Thin but adequately nourished Caucasian man HENNT: Pharynx no erythema or exudate Lymph nodes: No cervical, supraclavicular, axillary, or inguinal adenopathy Breasts: Lungs: Clear to auscultation resonant to percussion Heart: Regular rhythm Abdomen: Soft, nontender, I was unable to palpate his spleen easily today Extremities: No edema, no calf tenderness and this is an improvement from prior exams Vascular: No cyanosis Neurologic: Hard of hearing, no focal deficit Skin: No rash or ecchymosis  Lab Results: Lab Results  Component Value Date   WBC 2.4* 10/16/2012   HGB 11.5* 10/16/2012   HCT 34.1* 10/16/2012   MCV 98.6* 10/16/2012   PLT 64* 10/16/2012     Chemistry      Component Value Date/Time   NA 137 10/16/2012 0939   NA 137 09/19/2012 0450   K 4.2 10/16/2012 0939   K 3.6 09/19/2012 0450   CL 107 10/16/2012  0939   CL 103 09/19/2012 0450   CO2 22 10/16/2012 0939   CO2 26 09/19/2012 0450   BUN 22.0 10/16/2012 0939   BUN 10 09/19/2012 0450   CREATININE 0.9 10/16/2012 0939   CREATININE 0.98 09/19/2012 0450      Component Value Date/Time   CALCIUM 10.2 10/16/2012 0939   CALCIUM 9.4  09/19/2012 0450   ALKPHOS 116 10/16/2012 0939   ALKPHOS 119* 09/19/2012 0450   AST 21 10/16/2012 0939   AST 12 09/19/2012 0450   ALT 19 10/16/2012 0939   ALT 8 09/19/2012 0450   BILITOT 0.70 10/16/2012 0939   BILITOT 0.3 09/19/2012 0450       Radiological Studies: Ct Abdomen Pelvis Wo Contrast  10/16/2012  *RADIOLOGY REPORT*  Clinical Data:  Restaging mantle cell lymphoma  CT CHEST, ABDOMEN AND PELVIS WITHOUT CONTRAST  Technique:  Multidetector CT imaging of the chest, abdomen and pelvis was performed following the standard protocol without IV contrast.  Comparison:  03/28/2012  CT CHEST  Findings:  There is no axillary, mediastinal, or hilar lymphadenopathy.  No evidence for left supraclavicular lymphadenopathy on the current study.  Heart size is upper normal.  The patient is status post CABG.  No pericardial or pleural effusion.  Lung windows show emphysema.  The 8 mm spiculated density in the posterior right upper lobe is stable.  7 mm nodule measured just posterior to the right major fissure on the previous study measures 7 mm on image 28 today.  Bone windows reveal no worrisome lytic or sclerotic osseous lesions.  IMPRESSION: Stable exam.  No lymphadenopathy in the chest.  Stable right-sided lung nodules.  CT ABDOMEN AND PELVIS  Findings:  Calcified granuloma is seen in the dome of the liver. Spleen measures 16.1 cm in cranial caudal length, enlarged. Stomach is unremarkable.  Large diverticulum noted in the distal duodenum.  The pancreas, gallbladder, and adrenal glands are unremarkable.  Small bilateral nonobstructing renal stones are evident.  No abdominal aortic aneurysm. No retroperitoneal lymphadenopathy. The index lymph node measured previously in the hepatoduodenal ligament at 15 x 25 mm previously is now measured at 11 x 20 mm.  Upper omental nodule measures 8 x 17 mm today compared to 11 x 18 mm previously.  No free fluid in the abdomen.  No bowel obstruction.  Imaging through the  pelvis shows no free intraperitoneal fluid. There is no pelvic sidewall lymphadenopathy.  Contains only fat. Sigmoid diverticulosis noted without diverticulitis.  Terminal ileum is normal.  The appendix is normal.  Bone windows reveal no worrisome lytic or sclerotic osseous lesions.  IMPRESSION: No new or progressive disease.  Decreased size of the omental nodule and hepatoduodenal ligament lymph node.  Nephrolithiasis.  Stable splenomegaly.   Original Report Authenticated By: Kennith Center, M.D.    Ct Chest Wo Contrast  10/16/2012  *RADIOLOGY REPORT*  Clinical Data:  Restaging mantle cell lymphoma  CT CHEST, ABDOMEN AND PELVIS WITHOUT CONTRAST  Technique:  Multidetector CT imaging of the chest, abdomen and pelvis was performed following the standard protocol without IV contrast.  Comparison:  03/28/2012  CT CHEST  Findings:  There is no axillary, mediastinal, or hilar lymphadenopathy.  No evidence for left supraclavicular lymphadenopathy on the current study.  Heart size is upper normal.  The patient is status post CABG.  No pericardial or pleural effusion.  Lung windows show emphysema.  The 8 mm spiculated density in the posterior right upper lobe is stable.  7 mm nodule measured just  posterior to the right major fissure on the previous study measures 7 mm on image 28 today.  Bone windows reveal no worrisome lytic or sclerotic osseous lesions.  IMPRESSION: Stable exam.  No lymphadenopathy in the chest.  Stable right-sided lung nodules.  CT ABDOMEN AND PELVIS  Findings:  Calcified granuloma is seen in the dome of the liver. Spleen measures 16.1 cm in cranial caudal length, enlarged. Stomach is unremarkable.  Large diverticulum noted in the distal duodenum.  The pancreas, gallbladder, and adrenal glands are unremarkable.  Small bilateral nonobstructing renal stones are evident.  No abdominal aortic aneurysm. No retroperitoneal lymphadenopathy. The index lymph node measured previously in the hepatoduodenal  ligament at 15 x 25 mm previously is now measured at 11 x 20 mm.  Upper omental nodule measures 8 x 17 mm today compared to 11 x 18 mm previously.  No free fluid in the abdomen.  No bowel obstruction.  Imaging through the pelvis shows no free intraperitoneal fluid. There is no pelvic sidewall lymphadenopathy.  Contains only fat. Sigmoid diverticulosis noted without diverticulitis.  Terminal ileum is normal.  The appendix is normal.  Bone windows reveal no worrisome lytic or sclerotic osseous lesions.  IMPRESSION: No new or progressive disease.  Decreased size of the omental nodule and hepatoduodenal ligament lymph node.  Nephrolithiasis.  Stable splenomegaly.   Original Report Authenticated By: Kennith Center, M.D.     Impression and Plan: #1. Clinical stage III mantle cell lymphoma Excellent partial response and clinical stabilization to single agent Bendamustine. I would like to consolidate his response now by putting him on Rituxan anti-B cell antibody. We discussed this today and he is in agreement. I think he is stable enough at this time to proceed. He will receive standard dose 375 mg per meter squared weekly x4 with first dose to begin on Wednesday, December 4. If he tolerates this well, I will likely put him on a maintenance program since there is literature support for doing this.  There has been a major advance in the treatment of mantle cell lymphoma with approval last week of a new oral medication which inhibits abnormal B cell signaling and has shown dramatic responses in relapsed mantle cell lymphoma. The generic name ibrutinib. I would use this drug at sign of any progression.    #3. Coronary artery disease.  Previous bypass surgery. Previous coronary stent.   #4. 8 mm right upper lobe noncalcified pulmonary nodule noted on CT staging evaluation for his lymphoma. Remained stable on serial CT scans done since April 2013.  #5. Type 2 diabetes.   #6. Thrombocytopenia secondary to  splenomegaly due to his lymphoma with suspicion that he may also have bone marrow involvement.     CC:. Dr. Ivery Quale; Dr. Valera Castle   Levert Feinstein, MD 11/19/20139:16 AM the oral route from the

## 2012-10-27 ENCOUNTER — Telehealth: Payer: Self-pay | Admitting: *Deleted

## 2012-10-27 NOTE — Telephone Encounter (Signed)
Received call from pt's wife asking if pt could receive a flu vaccine.  Informed her that we would discuss with Dr Cyndie Chime.  She can be reached at [phone].

## 2012-10-31 ENCOUNTER — Telehealth: Payer: Self-pay | Admitting: *Deleted

## 2012-10-31 NOTE — Telephone Encounter (Signed)
Wife notified that Dr. Cyndie Chime is OK with pt getting flu vaccine before he starts rituxan.

## 2012-11-01 ENCOUNTER — Other Ambulatory Visit: Payer: Self-pay | Admitting: Oncology

## 2012-11-03 ENCOUNTER — Telehealth: Payer: Self-pay | Admitting: *Deleted

## 2012-11-03 DIAGNOSIS — R509 Fever, unspecified: Secondary | ICD-10-CM

## 2012-11-03 MED ORDER — CEPHALEXIN 500 MG PO CAPS: 500.0000 mg | ORAL_CAPSULE | Freq: Three times a day (TID) | ORAL | Status: DC

## 2012-11-03 NOTE — Telephone Encounter (Signed)
Pt's wife called reporting that pt had a toe taken off & home health nurse in & wanted Dr Cyndie Chime to know that pt ran fever 102.4 with chills previous to this high temp.  He had tylenol & temp back down this am.  The nurse is also going to call the surgeon.  Discussed with Dr Cyndie Chime & antibiotic suggested & wife informed & asked her to contact the [facility]  & script sent electronically.

## 2012-11-07 ENCOUNTER — Ambulatory Visit: Payer: Medicare Other

## 2012-11-08 ENCOUNTER — Ambulatory Visit (HOSPITAL_BASED_OUTPATIENT_CLINIC_OR_DEPARTMENT_OTHER): Payer: Medicare Other

## 2012-11-08 ENCOUNTER — Other Ambulatory Visit (HOSPITAL_BASED_OUTPATIENT_CLINIC_OR_DEPARTMENT_OTHER): Payer: Medicare Other | Admitting: Lab

## 2012-11-08 VITALS — BP 106/59 | HR 58 | Temp 97.5°F | Resp 20

## 2012-11-08 DIAGNOSIS — C8313 Mantle cell lymphoma, intra-abdominal lymph nodes: Secondary | ICD-10-CM

## 2012-11-08 DIAGNOSIS — Z5112 Encounter for antineoplastic immunotherapy: Secondary | ICD-10-CM

## 2012-11-08 DIAGNOSIS — R161 Splenomegaly, not elsewhere classified: Secondary | ICD-10-CM

## 2012-11-08 LAB — COMPREHENSIVE METABOLIC PANEL (CC13)
ALT: 25 U/L (ref 0–55)
AST: 23 U/L (ref 5–34)
Albumin: 3.5 g/dL (ref 3.5–5.0)
Alkaline Phosphatase: 140 U/L (ref 40–150)
BUN: 23 mg/dL (ref 7.0–26.0)
Calcium: 9.5 mg/dL (ref 8.4–10.4)
Chloride: 103 mEq/L (ref 98–107)
Potassium: 4.7 mEq/L (ref 3.5–5.1)
Sodium: 137 mEq/L (ref 136–145)
Total Protein: 5.7 g/dL — ABNORMAL LOW (ref 6.4–8.3)

## 2012-11-08 LAB — CBC WITH DIFFERENTIAL/PLATELET
BASO%: 0.4 % (ref 0.0–2.0)
HCT: 35.6 % — ABNORMAL LOW (ref 38.4–49.9)
HGB: 12 g/dL — ABNORMAL LOW (ref 13.0–17.1)
MCHC: 33.7 g/dL (ref 32.0–36.0)
MONO#: 0.8 10*3/uL (ref 0.1–0.9)
NEUT%: 71.5 % (ref 39.0–75.0)
RDW: 15.9 % — ABNORMAL HIGH (ref 11.0–14.6)
lymph#: 0.5 10*3/uL — ABNORMAL LOW (ref 0.9–3.3)
nRBC: 0 % (ref 0–0)

## 2012-11-08 LAB — URIC ACID (CC13): Uric Acid, Serum: 5.2 mg/dl (ref 2.6–7.4)

## 2012-11-08 MED ORDER — SODIUM CHLORIDE 0.9 % IV SOLN
375.0000 mg/m2 | Freq: Once | INTRAVENOUS | Status: AC
  Administered 2012-11-08: 800 mg via INTRAVENOUS
  Filled 2012-11-08: qty 80

## 2012-11-08 MED ORDER — DIPHENHYDRAMINE HCL 25 MG PO CAPS
50.0000 mg | ORAL_CAPSULE | Freq: Once | ORAL | Status: AC
  Administered 2012-11-08: 50 mg via ORAL

## 2012-11-08 MED ORDER — ACETAMINOPHEN 325 MG PO TABS
650.0000 mg | ORAL_TABLET | Freq: Once | ORAL | Status: AC
  Administered 2012-11-08: 650 mg via ORAL

## 2012-11-08 NOTE — Progress Notes (Signed)
Rituxan given per protocol.  Started at 50mg /hr d/t first dose.  Will continue to titrate by 50mg /hr as pt tolerates.  RN at bedside monitoring pt.

## 2012-11-08 NOTE — Patient Instructions (Addendum)
[facility] Discharge Instructions for Patients Receiving Chemotherapy  Today you received the following chemotherapy agents: Rituxan.  To help prevent nausea and vomiting after your treatment, we encourage you to take your nausea medication.   BELOW ARE SYMPTOMS THAT SHOULD BE REPORTED IMMEDIATELY:  *FEVER GREATER THAN 100.5 F  *CHILLS WITH OR WITHOUT FEVER  NAUSEA AND VOMITING THAT IS NOT CONTROLLED WITH YOUR NAUSEA MEDICATION  *UNUSUAL SHORTNESS OF BREATH  *UNUSUAL BRUISING OR BLEEDING  TENDERNESS IN MOUTH AND THROAT WITH OR WITHOUT PRESENCE OF ULCERS  *URINARY PROBLEMS  *BOWEL PROBLEMS  UNUSUAL RASH Items with * indicate a potential emergency and should be followed up as soon as possible.  One of the nurses will contact you 24 hours after your treatment. Please let the nurse know about any problems that you may have experienced. Feel free to call the clinic you have any questions or concerns. The clinic phone number is [phone].   Rituximab injection What is this medicine? RITUXIMAB (ri TUX i mab) is a monoclonal antibody. This medicine changes the way the body's immune system works. It is used commonly to treat non-Hodgkin's lymphoma and other conditions. In cancer cells, this drug targets a specific protein within cancer cells and stops the cancer cells from growing. It is also used to treat rhuematoid arthritis (RA). In RA, this medicine slow the inflammatory process and help reduce joint pain and swelling. This medicine is often used with other cancer or arthritis medications. This medicine may be used for other purposes; ask your health care provider or pharmacist if you have questions. What should I tell my health care provider before I take this medicine? They need to know if you have any of these conditions: -blood disorders -heart disease -history of hepatitis B -infection (especially a virus infection such as chickenpox, cold sores, or  herpes) -irregular heartbeat -kidney disease -lung or breathing disease, like asthma -lupus -an unusual or allergic reaction to rituximab, mouse proteins, other medicines, foods, dyes, or preservatives -pregnant or trying to get pregnant -breast-feeding How should I use this medicine? This medicine is for infusion into a vein. It is administered in a hospital or clinic by a specially trained health care professional. A special MedGuide will be given to you by the pharmacist with each prescription and refill. Be sure to read this information carefully each time. Talk to your pediatrician regarding the use of this medicine in children. This medicine is not approved for use in children. Overdosage: If you think you have taken too much of this medicine contact a poison control center or emergency room at once. NOTE: This medicine is only for you. Do not share this medicine with others. What if I miss a dose? It is important not to miss a dose. Call your doctor or health care professional if you are unable to keep an appointment. What may interact with this medicine? -cisplatin -medicines for blood pressure -some other medicines for arthritis -vaccines This list may not describe all possible interactions. Give your health care provider a list of all the medicines, herbs, non-prescription drugs, or dietary supplements you use. Also tell them if you smoke, drink alcohol, or use illegal drugs. Some items may interact with your medicine. What should I watch for while using this medicine? Report any side effects that you notice during your treatment right away, such as changes in your breathing, fever, chills, dizziness or lightheadedness. These effects are more common with the first dose. Visit your prescriber or  health care professional for checks on your progress. You will need to have regular blood work. Report any other side effects. The side effects of this medicine can continue after you finish  your treatment. Continue your course of treatment even though you feel ill unless your doctor tells you to stop. Call your doctor or health care professional for advice if you get a fever, chills or sore throat, or other symptoms of a cold or flu. Do not treat yourself. This drug decreases your body's ability to fight infections. Try to avoid being around people who are sick. This medicine may increase your risk to bruise or bleed. Call your doctor or health care professional if you notice any unusual bleeding. Be careful brushing and flossing your teeth or using a toothpick because you may get an infection or bleed more easily. If you have any dental work done, tell your dentist you are receiving this medicine. Avoid taking products that contain aspirin, acetaminophen, ibuprofen, naproxen, or ketoprofen unless instructed by your doctor. These medicines may hide a fever. Do not become pregnant while taking this medicine. Women should inform their doctor if they wish to become pregnant or think they might be pregnant. There is a potential for serious side effects to an unborn child. Talk to your health care professional or pharmacist for more information. Do not breast-feed an infant while taking this medicine. What side effects may I notice from receiving this medicine? Side effects that you should report to your doctor or health care professional as soon as possible: -allergic reactions like skin rash, itching or hives, swelling of the face, lips, or tongue -low blood counts - this medicine may decrease the number of white blood cells, red blood cells and platelets. You may be at increased risk for infections and bleeding. -signs of infection - fever or chills, cough, sore throat, pain or difficulty passing urine -signs of decreased platelets or bleeding - bruising, pinpoint red spots on the skin, black, tarry stools, blood in the urine -signs of decreased red blood cells - unusually weak or tired,  fainting spells, lightheadedness -breathing problems -confused, not responsive -chest pain -fast, irregular heartbeat -feeling faint or lightheaded, falls -mouth sores -redness, blistering, peeling or loosening of the skin, including inside the mouth -stomach pain -swelling of the ankles, feet, or hands -trouble passing urine or change in the amount of urine Side effects that usually do not require medical attention (report to your doctor or other health care professional if they continue or are bothersome): -anxiety -headache -loss of appetite -muscle aches -nausea -night sweats This list may not describe all possible side effects. Call your doctor for medical advice about side effects. You may report side effects to FDA at 1-800-FDA-1088. Where should I keep my medicine? This drug is given in a hospital or clinic and will not be stored at home. NOTE: This sheet is a summary. It may not cover all possible information. If you have questions about this medicine, talk to your doctor, pharmacist, or health care provider.  2013, [employer]vier/[employer] Standard. (07/22/2008 2:04:59 PM)

## 2012-11-08 NOTE — Progress Notes (Signed)
[  pt 1st time rituxan, progressed per protocol.  Tolerated well, no adverse reactions.   dmr

## 2012-11-09 ENCOUNTER — Telehealth: Payer: Self-pay | Admitting: *Deleted

## 2012-11-09 NOTE — Telephone Encounter (Signed)
Called patient to follow up on 1st rituxan. Pt states that he is "doing very well, no problems". Denies N/V, diarrhea or pain.

## 2012-11-10 ENCOUNTER — Telehealth: Payer: Self-pay | Admitting: *Deleted

## 2012-11-10 NOTE — Telephone Encounter (Signed)
Message copied by Orbie Hurst on Fri Nov 10, 2012 11:45 AM ------      Message from: Levert Feinstein      Created: Wed Nov 08, 2012  9:53 AM       Call pt - white count now fully recovered from chemo - should help to heal infection

## 2012-11-10 NOTE — Telephone Encounter (Signed)
Spoke with patient @ home number and wife @cell  number and let them know that white count fully recovered from chemotherapy and that should help to heal infection.  They both appreciated the phone call.

## 2012-11-13 ENCOUNTER — Other Ambulatory Visit: Payer: Medicare Other | Admitting: Lab

## 2012-11-13 ENCOUNTER — Ambulatory Visit: Payer: Medicare Other | Admitting: Nurse Practitioner

## 2012-11-13 ENCOUNTER — Ambulatory Visit: Payer: Medicare Other

## 2012-11-15 ENCOUNTER — Ambulatory Visit (HOSPITAL_BASED_OUTPATIENT_CLINIC_OR_DEPARTMENT_OTHER): Payer: Medicare Other | Admitting: Nurse Practitioner

## 2012-11-15 ENCOUNTER — Other Ambulatory Visit (HOSPITAL_BASED_OUTPATIENT_CLINIC_OR_DEPARTMENT_OTHER): Payer: Medicare Other | Admitting: Lab

## 2012-11-15 ENCOUNTER — Telehealth: Payer: Self-pay | Admitting: Oncology

## 2012-11-15 ENCOUNTER — Other Ambulatory Visit: Payer: Self-pay | Admitting: Oncology

## 2012-11-15 ENCOUNTER — Ambulatory Visit (HOSPITAL_BASED_OUTPATIENT_CLINIC_OR_DEPARTMENT_OTHER): Payer: Medicare Other

## 2012-11-15 VITALS — BP 132/52 | HR 54 | Temp 97.7°F | Resp 20 | Ht 73.0 in | Wt 178.6 lb

## 2012-11-15 VITALS — BP 118/54 | HR 53 | Temp 97.1°F

## 2012-11-15 DIAGNOSIS — E119 Type 2 diabetes mellitus without complications: Secondary | ICD-10-CM

## 2012-11-15 DIAGNOSIS — R911 Solitary pulmonary nodule: Secondary | ICD-10-CM

## 2012-11-15 DIAGNOSIS — D696 Thrombocytopenia, unspecified: Secondary | ICD-10-CM

## 2012-11-15 DIAGNOSIS — Z5112 Encounter for antineoplastic immunotherapy: Secondary | ICD-10-CM

## 2012-11-15 DIAGNOSIS — Z23 Encounter for immunization: Secondary | ICD-10-CM

## 2012-11-15 DIAGNOSIS — C8313 Mantle cell lymphoma, intra-abdominal lymph nodes: Secondary | ICD-10-CM

## 2012-11-15 DIAGNOSIS — R161 Splenomegaly, not elsewhere classified: Secondary | ICD-10-CM

## 2012-11-15 DIAGNOSIS — L299 Pruritus, unspecified: Secondary | ICD-10-CM

## 2012-11-15 DIAGNOSIS — C8590 Non-Hodgkin lymphoma, unspecified, unspecified site: Secondary | ICD-10-CM

## 2012-11-15 LAB — COMPREHENSIVE METABOLIC PANEL (CC13)
ALT: 24 U/L (ref 0–55)
AST: 24 U/L (ref 5–34)
Albumin: 3.6 g/dL (ref 3.5–5.0)
Alkaline Phosphatase: 153 U/L — ABNORMAL HIGH (ref 40–150)
BUN: 29 mg/dL — ABNORMAL HIGH (ref 7.0–26.0)
Creatinine: 1.2 mg/dL (ref 0.7–1.3)
Potassium: 4.7 mEq/L (ref 3.5–5.1)

## 2012-11-15 LAB — CBC WITH DIFFERENTIAL/PLATELET
BASO%: 0.3 % (ref 0.0–2.0)
EOS%: 1.8 % (ref 0.0–7.0)
HCT: 33.4 % — ABNORMAL LOW (ref 38.4–49.9)
MCH: 33.7 pg — ABNORMAL HIGH (ref 27.2–33.4)
MCHC: 34.1 g/dL (ref 32.0–36.0)
MONO#: 0.8 10*3/uL (ref 0.1–0.9)
NEUT%: 72.1 % (ref 39.0–75.0)
RBC: 3.38 10*6/uL — ABNORMAL LOW (ref 4.20–5.82)
WBC: 7.2 10*3/uL (ref 4.0–10.3)
lymph#: 1.1 10*3/uL (ref 0.9–3.3)
nRBC: 0 % (ref 0–0)

## 2012-11-15 MED ORDER — ACETAMINOPHEN 325 MG PO TABS
650.0000 mg | ORAL_TABLET | Freq: Once | ORAL | Status: AC
  Administered 2012-11-15: 650 mg via ORAL

## 2012-11-15 MED ORDER — SODIUM CHLORIDE 0.9 % IV SOLN
375.0000 mg/m2 | Freq: Once | INTRAVENOUS | Status: AC
  Administered 2012-11-15: 800 mg via INTRAVENOUS
  Filled 2012-11-15: qty 80

## 2012-11-15 MED ORDER — DIPHENHYDRAMINE HCL 25 MG PO CAPS
50.0000 mg | ORAL_CAPSULE | Freq: Once | ORAL | Status: AC
  Administered 2012-11-15: 50 mg via ORAL

## 2012-11-15 MED ORDER — HYDROXYZINE HCL 25 MG PO TABS: 25.0000 mg | ORAL_TABLET | Freq: Three times a day (TID) | ORAL | Status: DC | PRN

## 2012-11-15 MED ORDER — SODIUM CHLORIDE 0.9 % IV SOLN
Freq: Once | INTRAVENOUS | Status: AC
  Administered 2012-11-15: 50 mL via INTRAVENOUS

## 2012-11-15 MED ORDER — INFLUENZA VIRUS VACC SPLIT PF IM SUSP
0.5000 mL | Freq: Once | INTRAMUSCULAR | Status: AC
  Administered 2012-11-15: 0.5 mL via INTRAMUSCULAR
  Filled 2012-11-15: qty 0.5

## 2012-11-15 NOTE — Progress Notes (Signed)
OFFICE PROGRESS NOTE  Interval history:   [name] is an 78 year old man diagnosed with clinical stage III mantle cell lymphoma in April of this year at which time he presented with pancytopenia and splenomegaly, progressive weakness and weight loss. He is on active treatment with single agent bendamustine 70 mg per meter squared for 2 consecutive days every 28 days. Restaging CT scans following 3 cycles showed a significant partial response. He completed cycle 6 beginning 09/28/2012. Restaging CT evaluation 10/16/2012 showed no lymphadenopathy in the chest; stable right-sided lung nodules; no new or progressive disease in the abdomen and pelvis; decreased size of an omental nodule and hepatoduodenal ligament lymph node; nephrolithiasis and stable splenomegaly.  He began Rituxan weekly x4 on 11/08/2012. He reports tolerating the infusion well. No signs of an allergic reaction. He denies fevers or sweats. He has a good appetite. No nausea or vomiting. No constipation or diarrhea. He denies bleeding. He has chronic pain at the right face related to a previous shingles rash. He continues to note intermittent generalized pruritus.  Objective: Blood pressure 132/52, pulse 54, temperature 97.7 F (36.5 C), temperature source Oral, resp. rate 20, height 6\' 1"  (1.854 m), weight 178 lb 9.6 oz (81.012 kg).  Oropharynx is without thrush or ulceration. No palpable cervical, supraclavicular or axillary lymph nodes. Lungs are clear. Regular cardiac rhythm. Abdomen is soft and nontender. No obvious splenomegaly. Extremities are without edema. Amputation left great toe. Appears well healed. No erythema. Motor strength 5 over 5.  Lab Results: Lab Results  Component Value Date   WBC 7.2 11/15/2012   HGB 11.4* 11/15/2012   HCT 33.4* 11/15/2012   MCV 98.8* 11/15/2012   PLT 91* 11/15/2012    Chemistry:    Chemistry      Component Value Date/Time   NA 140 11/15/2012 0941   NA 137 09/19/2012 0450   K 4.7  11/15/2012 0941   K 3.6 09/19/2012 0450   CL 107 11/15/2012 0941   CL 103 09/19/2012 0450   CO2 27 11/15/2012 0941   CO2 26 09/19/2012 0450   BUN 29.0* 11/15/2012 0941   BUN 10 09/19/2012 0450   CREATININE 1.2 11/15/2012 0941   CREATININE 0.98 09/19/2012 0450      Component Value Date/Time   CALCIUM 9.7 11/15/2012 0941   CALCIUM 9.4 09/19/2012 0450   ALKPHOS 153* 11/15/2012 0941   ALKPHOS 119* 09/19/2012 0450   AST 24 11/15/2012 0941   AST 12 09/19/2012 0450   ALT 24 11/15/2012 0941   ALT 8 09/19/2012 0450   BILITOT 0.73 11/15/2012 0941   BILITOT 0.3 09/19/2012 0450       Studies/Results: No results found.  Medications: I have reviewed the patient's current medications.  Assessment/Plan:  1. Clinical stage III mantle cell lymphoma status post single agent bendamustine x6 cycles 04/17/2012 through 09/28/2012 with a partial response. He began a course of Rituxan weekly x4 on 11/08/2012 with plans for a maintenance program of Rituxan if it is well-tolerated. 2. Status post amputation left great toe 09/16/2012. 3. 8 mm right upper lobe noncalcified pulmonary nodule noted on CT staging evaluation for lymphoma. Stable on most recent CT evaluation 10/16/2012. 4. Coronary artery disease. Previous bypass surgery. Previous coronary stent. 5. Thrombocytopenia secondary to splenomegaly related to lymphoma with suspicion of bone marrow involvement. 6. Type 2 diabetes. 7. Chronic pain right face related to previous herpes zoster.  Disposition-[name] appears stable. He tolerated the first of a planned 4 weekly treatments with Rituxan well.  Plan to proceed with week 2 today as scheduled. He will return for a followup visit and initiation of an every 2 month maintenance program of Rituxan beginning 01/30/2013. He will contact the office in the interim with any problems.  Plan reviewed with Dr. Cyndie Chime.  Lonna Cobb ANP/GNP-BC

## 2012-11-15 NOTE — Patient Instructions (Addendum)
[facility] Discharge Instructions for Patients Receiving Chemotherapy  Today you received the following chemotherapy agents :  Rituxan  To help prevent nausea and vomiting after your treatment, we encourage you to take your nausea medication If you develop nausea and vomiting that is not controlled by your nausea medication, call the clinic. If it is after clinic hours your family physician or the after hours number for the clinic or go to the Emergency Department.   BELOW ARE SYMPTOMS THAT SHOULD BE REPORTED IMMEDIATELY:  *FEVER GREATER THAN 100.5 F  *CHILLS WITH OR WITHOUT FEVER  NAUSEA AND VOMITING THAT IS NOT CONTROLLED WITH YOUR NAUSEA MEDICATION  *UNUSUAL SHORTNESS OF BREATH  *UNUSUAL BRUISING OR BLEEDING  TENDERNESS IN MOUTH AND THROAT WITH OR WITHOUT PRESENCE OF ULCERS  *URINARY PROBLEMS  *BOWEL PROBLEMS  UNUSUAL RASH Items with * indicate a potential emergency and should be followed up as soon as possible.  Please let the nurse know about any problems that you may have experienced. Feel free to call the clinic you have any questions or concerns. The clinic phone number is [phone].   I have been informed and understand all the instructions given to me. I know to contact the clinic, my physician, or go to the Emergency Department if any problems should occur. I do not have any questions at this time, but understand that I may call the clinic during office hours   should I have any questions or need assistance in obtaining follow up care.    __________________________________________  _____________  __________ Signature of Patient or Authorized Representative            Date                   Time    __________________________________________ Nurse's Signature

## 2012-11-15 NOTE — Telephone Encounter (Signed)
Gave pt appt for February 2014 lab,MD emailed for chemo

## 2012-11-16 ENCOUNTER — Telehealth: Payer: Self-pay | Admitting: Oncology

## 2012-11-16 NOTE — Telephone Encounter (Signed)
Talked to patient's wife , gave her appt for 12/18 and 11/30/12 advised patient  to get calendar

## 2012-11-20 ENCOUNTER — Telehealth: Payer: Self-pay | Admitting: *Deleted

## 2012-11-20 ENCOUNTER — Observation Stay (HOSPITAL_COMMUNITY)
Admission: RE | Admit: 2012-11-20 | Discharge: 2012-11-22 | Disposition: A | Discharge status: Home / Self Care | Payer: Medicare Other | Source: Ambulatory Visit | Attending: Orthopedic Surgery | Admitting: Orthopedic Surgery

## 2012-11-20 ENCOUNTER — Encounter (HOSPITAL_COMMUNITY): Payer: Self-pay | Admitting: *Deleted

## 2012-11-20 ENCOUNTER — Ambulatory Visit: Payer: Medicare Other

## 2012-11-20 DIAGNOSIS — R609 Edema, unspecified: Secondary | ICD-10-CM

## 2012-11-20 DIAGNOSIS — C8313 Mantle cell lymphoma, intra-abdominal lymph nodes: Secondary | ICD-10-CM | POA: Insufficient documentation

## 2012-11-20 DIAGNOSIS — I10 Essential (primary) hypertension: Secondary | ICD-10-CM | POA: Diagnosis present

## 2012-11-20 DIAGNOSIS — Z79899 Other long term (current) drug therapy: Secondary | ICD-10-CM | POA: Insufficient documentation

## 2012-11-20 DIAGNOSIS — E1169 Type 2 diabetes mellitus with other specified complication: Secondary | ICD-10-CM | POA: Insufficient documentation

## 2012-11-20 DIAGNOSIS — R9431 Abnormal electrocardiogram [ECG] [EKG]: Secondary | ICD-10-CM

## 2012-11-20 DIAGNOSIS — I2510 Atherosclerotic heart disease of native coronary artery without angina pectoris: Secondary | ICD-10-CM | POA: Insufficient documentation

## 2012-11-20 DIAGNOSIS — L299 Pruritus, unspecified: Secondary | ICD-10-CM

## 2012-11-20 DIAGNOSIS — I499 Cardiac arrhythmia, unspecified: Secondary | ICD-10-CM | POA: Insufficient documentation

## 2012-11-20 DIAGNOSIS — I96 Gangrene, not elsewhere classified: Principal | ICD-10-CM | POA: Diagnosis present

## 2012-11-20 DIAGNOSIS — M9080 Osteopathy in diseases classified elsewhere, unspecified site: Secondary | ICD-10-CM | POA: Insufficient documentation

## 2012-11-20 DIAGNOSIS — E119 Type 2 diabetes mellitus without complications: Secondary | ICD-10-CM

## 2012-11-20 DIAGNOSIS — M86179 Other acute osteomyelitis, unspecified ankle and foot: Secondary | ICD-10-CM | POA: Insufficient documentation

## 2012-11-20 HISTORY — DX: Osteomyelitis, unspecified: M86.9

## 2012-11-20 HISTORY — DX: Solitary pulmonary nodule: R91.1

## 2012-11-20 HISTORY — DX: Chronic embolism and thrombosis of unspecified deep veins of unspecified lower extremity: I82.509

## 2012-11-20 LAB — CBC
MCH: 33.5 pg (ref 26.0–34.0)
MCHC: 33.6 g/dL (ref 30.0–36.0)
MCV: 99.7 fL (ref 78.0–100.0)
Platelets: 85 10*3/uL — ABNORMAL LOW (ref 150–400)
RBC: 3.25 MIL/uL — ABNORMAL LOW (ref 4.22–5.81)

## 2012-11-20 LAB — BASIC METABOLIC PANEL
BUN: 22 mg/dL (ref 6–23)
CO2: 26 mEq/L (ref 19–32)
Calcium: 10.2 mg/dL (ref 8.4–10.5)
Glucose, Bld: 142 mg/dL — ABNORMAL HIGH (ref 70–99)
Sodium: 137 mEq/L (ref 135–145)

## 2012-11-20 MED ORDER — SENNA 8.6 MG PO TABS
1.0000 | ORAL_TABLET | Freq: Two times a day (BID) | ORAL | Status: DC
  Administered 2012-11-20: 8.6 mg via ORAL
  Filled 2012-11-20 (×3): qty 1

## 2012-11-20 MED ORDER — CYANOCOBALAMIN 500 MCG PO TABS
500.0000 ug | ORAL_TABLET | Freq: Every day | ORAL | Status: DC
  Filled 2012-11-20 (×2): qty 1

## 2012-11-20 MED ORDER — LOSARTAN POTASSIUM 50 MG PO TABS
100.0000 mg | ORAL_TABLET | Freq: Every day | ORAL | Status: DC
  Filled 2012-11-20: qty 2

## 2012-11-20 MED ORDER — ACETAMINOPHEN 325 MG PO TABS: 650.0000 mg | ORAL_TABLET | Freq: Four times a day (QID) | ORAL | Status: DC | PRN

## 2012-11-20 MED ORDER — ASPIRIN EC 81 MG PO TBEC
81.0000 mg | DELAYED_RELEASE_TABLET | Freq: Every day | ORAL | Status: DC
  Filled 2012-11-20: qty 1

## 2012-11-20 MED ORDER — ACYCLOVIR 200 MG PO CAPS
400.0000 mg | ORAL_CAPSULE | Freq: Two times a day (BID) | ORAL | Status: DC
  Administered 2012-11-20 – 2012-11-21 (×2): 400 mg via ORAL
  Filled 2012-11-20 (×6): qty 2

## 2012-11-20 MED ORDER — SODIUM CHLORIDE 0.9 % IV SOLN
INTRAVENOUS | Status: DC
  Administered 2012-11-21: via INTRAVENOUS

## 2012-11-20 MED ORDER — METOPROLOL SUCCINATE ER 25 MG PO TB24
25.0000 mg | ORAL_TABLET | Freq: Every day | ORAL | Status: DC
  Administered 2012-11-21: 25 mg via ORAL
  Filled 2012-11-20: qty 1

## 2012-11-20 MED ORDER — CHLORHEXIDINE GLUCONATE 4 % EX LIQD
60.0000 mL | Freq: Once | CUTANEOUS | Status: AC
  Administered 2012-11-20: 4 via TOPICAL
  Filled 2012-11-20: qty 60

## 2012-11-20 MED ORDER — GABAPENTIN 300 MG PO CAPS
300.0000 mg | ORAL_CAPSULE | Freq: Three times a day (TID) | ORAL | Status: DC
  Administered 2012-11-20: 300 mg via ORAL
  Filled 2012-11-20 (×4): qty 1

## 2012-11-20 MED ORDER — VANCOMYCIN HCL 1000 MG IV SOLR
750.0000 mg | Freq: Two times a day (BID) | INTRAVENOUS | Status: DC
  Administered 2012-11-21 – 2012-11-22 (×3): 750 mg via INTRAVENOUS
  Filled 2012-11-20 (×5): qty 750

## 2012-11-20 MED ORDER — OXYCODONE HCL 5 MG PO TABS: 5.0000 mg | ORAL_TABLET | ORAL | Status: DC | PRN

## 2012-11-20 MED ORDER — ACYCLOVIR 400 MG PO TABS
400.0000 mg | ORAL_TABLET | Freq: Two times a day (BID) | ORAL | Status: DC
  Filled 2012-11-20: qty 1

## 2012-11-20 MED ORDER — VANCOMYCIN HCL 10 G IV SOLR
1250.0000 mg | Freq: Once | INTRAVENOUS | Status: AC
  Administered 2012-11-20: 1250 mg via INTRAVENOUS
  Filled 2012-11-20: qty 1250

## 2012-11-20 MED ORDER — VITAMIN B-12 500 MCG PO TABS: 500.0000 ug | ORAL_TABLET | Freq: Every day | ORAL | Status: DC

## 2012-11-20 MED ORDER — INSULIN ASPART 100 UNIT/ML ~~LOC~~ SOLN
0.0000 [IU] | Freq: Three times a day (TID) | SUBCUTANEOUS | Status: DC
  Administered 2012-11-21: 3 [IU] via SUBCUTANEOUS
  Administered 2012-11-21: 2 [IU] via SUBCUTANEOUS

## 2012-11-20 MED ORDER — DOCUSATE SODIUM 100 MG PO CAPS
100.0000 mg | ORAL_CAPSULE | Freq: Two times a day (BID) | ORAL | Status: DC
  Administered 2012-11-20: 100 mg via ORAL
  Filled 2012-11-20 (×3): qty 1

## 2012-11-20 MED ORDER — ACETAMINOPHEN 650 MG RE SUPP: 650.0000 mg | Freq: Four times a day (QID) | RECTAL | Status: DC | PRN

## 2012-11-20 MED ORDER — HYDROXYZINE HCL 25 MG PO TABS
25.0000 mg | ORAL_TABLET | Freq: Three times a day (TID) | ORAL | Status: DC | PRN
  Administered 2012-11-20 – 2012-11-21 (×2): 25 mg via ORAL
  Filled 2012-11-20 (×3): qty 1

## 2012-11-20 MED ORDER — ISOSORBIDE MONONITRATE ER 30 MG PO TB24
30.0000 mg | ORAL_TABLET | Freq: Every day | ORAL | Status: DC
  Filled 2012-11-20: qty 1

## 2012-11-20 MED ORDER — SIMVASTATIN 20 MG PO TABS
20.0000 mg | ORAL_TABLET | Freq: Every day | ORAL | Status: DC
  Administered 2012-11-20 – 2012-11-21 (×2): 20 mg via ORAL
  Filled 2012-11-20 (×2): qty 1

## 2012-11-20 MED ORDER — ONDANSETRON HCL 4 MG/2ML IJ SOLN: 4.0000 mg | Freq: Four times a day (QID) | INTRAMUSCULAR | Status: DC | PRN

## 2012-11-20 NOTE — Progress Notes (Signed)
ANTIBIOTIC CONSULT NOTE - INITIAL  Pharmacy Consult for vancomycin Indication: osteomyelitis  Allergies  Allergen Reactions  . Azithromycin Itching  . Macrodantin Itching  . Minocycline Hcl Itching  . Nitrofurantoin Itching  . Zithromax (Azithromycin Dihydrate) Itching  . Contrast Media (Iodinated Diagnostic Agents) Rash    Patient Measurements:   Total body weight 81 kg  Vital Signs:   Intake/Output from previous day:   Intake/Output from this shift:    Labs: No results found for this basename: WBC:3,HGB:3,PLT:3,LABCREA:3,CREATININE:3 in the last 72 hours The CrCl is unknown because both a height and weight (above a minimum accepted value) are required for this calculation. No results found for this basename: VANCOTROUGH:2,VANCOPEAK:2,VANCORANDOM:2,GENTTROUGH:2,GENTPEAK:2,GENTRANDOM:2,TOBRATROUGH:2,TOBRAPEAK:2,TOBRARND:2,AMIKACINPEAK:2,AMIKACINTROU:2,AMIKACIN:2, in the last 72 hours   Microbiology: No results found for this or any previous visit (from the past 720 hour(s)).  Medical History: Past Medical History  Diagnosis Date  . CAD (coronary artery disease)     a. s/p CABG 1995;  b. NSTEMI & subsequent BMS to SVG->right PDA 02/03/12.;  c. Cath 02/14/2012 3vd with 4/4 patent grafts and patent stent in vg->pda.  . Cerebrovascular disease, unspecified   . Unspecified essential hypertension   . Other and unspecified hyperlipidemia   . Type II or unspecified type diabetes mellitus without mention of complication, not stated as uncontrolled   . Osteoarthritis (arthritis due to wear and tear of joints)   . Asthmatic bronchitis   . BPH (benign prostatic hypertrophy)   . Herpes zoster   . Arthritis   . Anemia   . Thrombocytopenia   . Aortic stenosis, moderate     a. Echo 02/14/12 EF 55%, Gr 2 DD, Mod AS (mean grad: 29, peak grad 52)  . Splenomegaly 04/13/2012  . History of blood transfusion 04/13/2012  . 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  Dermatitis 06/07/2012    acnieform rash on back 06/06/12  . COPD (chronic obstructive pulmonary disease)   . Heart murmur   . Peripheral vascular disease     Medications:  Prescriptions prior to admission  Medication Sig Dispense Refill  . acyclovir (ZOVIRAX) 400 MG tablet Take 400 mg by mouth 2 (two) times daily.      Marland Kitchen aspirin EC 81 MG tablet Take 81 mg by mouth every morning.       . gabapentin (NEURONTIN) 300 MG capsule Take 300-600 mg by mouth 3 (three) times daily. TAKES 2 CAPSULES IN THE MORNING AND 2 AT LUNCH AND 1 AT NIGHT      . hydrOXYzine (ATARAX/VISTARIL) 25 MG tablet Take 1 tablet (25 mg total) by mouth every 8 (eight) hours as needed for itching.  30 tablet  0  . isosorbide mononitrate (IMDUR) 30 MG 24 hr tablet Take 1 tablet (30 mg total) by mouth daily.  30 tablet  11  . losartan (COZAAR) 100 MG tablet Take 1 tablet by mouth daily.       . meloxicam (MOBIC) 15 MG tablet Take 15 mg by mouth daily.      . metoprolol succinate (TOPROL-XL) 25 MG 24 hr tablet Take 25 mg by mouth every morning.       . ondansetron (ZOFRAN-ODT) 8 MG disintegrating tablet Place 8 mg under the tongue every 8 (eight) hours as needed. For nausea.      . pravastatin (PRAVACHOL) 40 MG tablet Take 40 mg by mouth daily.      . vitamin B-12 (CYANOCOBALAMIN) 500 MCG tablet Take 500 mcg by mouth daily.  Assessment: 78 yo to start vancomycin for osteomyelitis.  His CrCl is ~ 50 ml/min.  Goal of Therapy:  Vancomycin trough level 15-20 mcg/ml  Plan:  Vancomycin load of 1250 mg then 750 mg IV q12 hours. F/u clinical course, cultures and renal function.  Reyce Lubeck Poteet 11/20/2012,5:45 PM

## 2012-11-20 NOTE — H&P (Signed)
[name] is an 78 y.o. male.   Chief Complaint: left 2nd toe wound HPI: 78 y/o male presents today c/o redness at left 2nd toe.  He denies f/c/n/v/wt loss.  He is s/p L hallux amputation in mid October.  This healed well.  He denies any pain in the 2nd toe.  He says his [facility] RN has been watching it, but it got more swollen and red ove rthe weekend.  It started draining then as well.  PMH is significant for previous amputation as well as diabetes and peripheral neuropathy and PVD.  Past Medical History  Diagnosis Date  . CAD (coronary artery disease)     a. s/p CABG 1995;  b. NSTEMI & subsequent BMS to SVG->right PDA 02/03/12.;  c. Cath 02/14/2012 3vd with 4/4 patent grafts and patent stent in vg->pda.  . Cerebrovascular disease, unspecified   . Unspecified essential hypertension   . Other and unspecified hyperlipidemia   . Type II or unspecified type diabetes mellitus without mention of complication, not stated as uncontrolled   . Osteoarthritis (arthritis due to wear and tear of joints)   . Asthmatic bronchitis   . BPH (benign prostatic hypertrophy)   . Herpes zoster   . Arthritis   . Anemia   . Thrombocytopenia   . Aortic stenosis, moderate     a. Echo 02/14/12 EF 55%, Gr 2 DD, Mod AS (mean grad: 29, peak grad 52)  . Splenomegaly 04/13/2012  . History of blood transfusion 04/13/2012  . 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 Dermatitis 06/07/2012    acnieform rash on back 06/06/12  . COPD (chronic obstructive pulmonary disease)   . Heart murmur   . Peripheral vascular disease     Past Surgical History  Procedure Date  . Coronary artery bypass graft 1995  . Rotator cuff repair   . Carpal tunnel release   . Incisional hernia repair   . Kidney surgery   . Back surgery   . I&d extremity 09/16/2012    Procedure: IRRIGATION AND DEBRIDEMENT EXTREMITY;  Surgeon: [name], MD;  Location: [facility] ORS;  Service: Orthopedics;  Laterality: Left;  irrigation and  debridement and first Ray amputation of left foot  . Amputation 09/16/2012    Procedure: AMPUTATION RAY;  Surgeon: [name], MD;  Location: [facility] ORS;  Service: Orthopedics;  Laterality: Left;  first Ray amputation left foot    Family History  Problem Relation Age of Onset  . Coronary artery disease    . Alcohol abuse     Social History:  reports that he quit smoking about 27 years ago. His smoking use included Cigarettes and Pipe. He has a 30 pack-year smoking history. He has never used smokeless tobacco. He reports that he does not drink alcohol or use illicit drugs.  Allergies:  Allergies  Allergen Reactions  . Azithromycin Itching  . Macrodantin Itching  . Minocycline Hcl Itching  . Nitrofurantoin Itching  . Zithromax (Azithromycin Dihydrate) Itching  . Contrast Media (Iodinated Diagnostic Agents) Rash    Medications Prior to Admission  Medication Sig Dispense Refill  . acyclovir (ZOVIRAX) 400 MG tablet Take 400 mg by mouth 2 (two) times daily.      Marland Kitchen aspirin EC 81 MG tablet Take 81 mg by mouth every morning.       . gabapentin (NEURONTIN) 300 MG capsule Take 300-600 mg by mouth 3 (three) times daily. TAKES 2 CAPSULES IN THE MORNING AND 2 AT LUNCH AND  1 AT NIGHT      . hydrOXYzine (ATARAX/VISTARIL) 25 MG tablet Take 1 tablet (25 mg total) by mouth every 8 (eight) hours as needed for itching.  30 tablet  0  . isosorbide mononitrate (IMDUR) 30 MG 24 hr tablet Take 1 tablet (30 mg total) by mouth daily.  30 tablet  11  . losartan (COZAAR) 100 MG tablet Take 1 tablet by mouth daily.       . meloxicam (MOBIC) 15 MG tablet Take 15 mg by mouth daily.      . metoprolol succinate (TOPROL-XL) 25 MG 24 hr tablet Take 25 mg by mouth every morning.       . ondansetron (ZOFRAN-ODT) 8 MG disintegrating tablet Place 8 mg under the tongue every 8 (eight) hours as needed. For nausea.      . pravastatin (PRAVACHOL) 40 MG tablet Take 40 mg by mouth daily.      . vitamin B-12 (CYANOCOBALAMIN) 500  MCG tablet Take 500 mcg by mouth daily.        No results found for this or any previous visit (from the past 48 hour(s)). No results found.  ROS as above and o/w neg.  Blood pressure 161/63, pulse 107, temperature 98 F (36.7 C), resp. rate 20, SpO2 97.00%. Physical Exam  wn wd elderly male in nad.  A and O x 4.  Mood and affect normal.  EOMI.  Respirations unlabored.  L 2nd toe with ulcer distally.  Distal phalanx is exposed.  The end has wet gangrene and there is cellulitis to the level of the PIP joint.  1+ dp and pt pulses.  Diminshed sens to LT at forefoot.  Tight heelcord.  No lymphadenopathy.  5/5 strength in PF and DF at the ankle.  Xrays:  L 3v foot films show previous hallux ray resection and no lysis at the tip of the second toe  Assessment/Plan L 2nd toe gangrene / cellulitis - the pt is admitted today and will start on IV vanc.  He'll be NPO after midnight for left 2nd toe amputation tomorrow.  He understands the plan and agrees.  [name] 11/20/2012, 5:54 PM

## 2012-11-20 NOTE — Progress Notes (Signed)
i called patients cell phone.  Pts wife [name] said that they were on their way to Dr [facility] office now and did not know about OR scheduled for am.  I will call back later.

## 2012-11-20 NOTE — Telephone Encounter (Signed)
Received call from pt's wife; stating that pt is going to be admitted today and have left foot "second toe amputated" on 11/21/12 by Dr. Victorino Dike.  Will have to cancel 11/22/12 lab/ Infusion appt and "does not know about next appt after that"  Note to Dr. Cyndie Chime.

## 2012-11-21 ENCOUNTER — Observation Stay (HOSPITAL_COMMUNITY): Payer: Medicare Other | Admitting: Anesthesiology

## 2012-11-21 ENCOUNTER — Encounter (HOSPITAL_COMMUNITY): Payer: Self-pay | Admitting: General Practice

## 2012-11-21 ENCOUNTER — Encounter (HOSPITAL_COMMUNITY): Payer: Self-pay | Admitting: Anesthesiology

## 2012-11-21 ENCOUNTER — Encounter (HOSPITAL_COMMUNITY): Payer: Self-pay | Admitting: Certified Registered Nurse Anesthetist

## 2012-11-21 ENCOUNTER — Encounter (HOSPITAL_COMMUNITY)
Admission: RE | Disposition: A | Discharge status: Home / Self Care | Payer: Self-pay | Source: Ambulatory Visit | Attending: Orthopedic Surgery

## 2012-11-21 DIAGNOSIS — E119 Type 2 diabetes mellitus without complications: Secondary | ICD-10-CM

## 2012-11-21 DIAGNOSIS — I96 Gangrene, not elsewhere classified: Secondary | ICD-10-CM | POA: Diagnosis present

## 2012-11-21 DIAGNOSIS — I10 Essential (primary) hypertension: Secondary | ICD-10-CM

## 2012-11-21 DIAGNOSIS — L299 Pruritus, unspecified: Secondary | ICD-10-CM

## 2012-11-21 DIAGNOSIS — R9431 Abnormal electrocardiogram [ECG] [EKG]: Secondary | ICD-10-CM

## 2012-11-21 HISTORY — PX: AMPUTATION: SHX166

## 2012-11-21 LAB — GLUCOSE, CAPILLARY
Glucose-Capillary: 90 mg/dL (ref 70–99)
Glucose-Capillary: 138 mg/dL — ABNORMAL HIGH (ref 70–99)
Glucose-Capillary: 116 mg/dL — ABNORMAL HIGH (ref 70–99)

## 2012-11-21 LAB — MAGNESIUM: Magnesium: 1.3 mg/dL — ABNORMAL LOW (ref 1.5–2.5)

## 2012-11-21 SURGERY — AMPUTATION, FOOT, RAY
Anesthesia: Monitor Anesthesia Care | Site: Toe | Laterality: Left | Wound class: Dirty or Infected

## 2012-11-21 MED ORDER — FENTANYL CITRATE 0.05 MG/ML IJ SOLN
50.0000 ug | Freq: Once | INTRAMUSCULAR | Status: AC
  Administered 2012-11-21: 50 ug via INTRAVENOUS

## 2012-11-21 MED ORDER — LACTATED RINGERS IV SOLN
INTRAVENOUS | Status: DC | PRN
  Administered 2012-11-21: 16:00:00 via INTRAVENOUS

## 2012-11-21 MED ORDER — ONDANSETRON HCL 4 MG/2ML IJ SOLN: 4.0000 mg | Freq: Four times a day (QID) | INTRAMUSCULAR | Status: DC | PRN

## 2012-11-21 MED ORDER — ROPIVACAINE HCL 5 MG/ML IJ SOLN
INTRAMUSCULAR | Status: DC | PRN
  Administered 2012-11-21: 30 mL via EPIDURAL

## 2012-11-21 MED ORDER — SODIUM CHLORIDE 0.9 % IV SOLN
INTRAVENOUS | Status: DC
  Administered 2012-11-22: 01:00:00 via INTRAVENOUS

## 2012-11-21 MED ORDER — MIDAZOLAM HCL 2 MG/2ML IJ SOLN: 0.5000 mg | INTRAMUSCULAR | Status: DC | PRN

## 2012-11-21 MED ORDER — 0.9 % SODIUM CHLORIDE (POUR BTL) OPTIME
TOPICAL | Status: DC | PRN
  Administered 2012-11-21: 1000 mL

## 2012-11-21 MED ORDER — HYDROXYZINE HCL 10 MG/5ML PO SYRP
10.0000 mg | ORAL_SOLUTION | Freq: Three times a day (TID) | ORAL | Status: DC | PRN
  Administered 2012-11-22: 10 mg via ORAL
  Filled 2012-11-21 (×2): qty 5

## 2012-11-21 MED ORDER — FENTANYL CITRATE 0.05 MG/ML IJ SOLN
INTRAMUSCULAR | Status: AC
  Filled 2012-11-21: qty 2

## 2012-11-21 MED ORDER — ONDANSETRON HCL 4 MG PO TABS: 4.0000 mg | ORAL_TABLET | Freq: Four times a day (QID) | ORAL | Status: DC | PRN

## 2012-11-21 MED ORDER — GABAPENTIN 300 MG PO CAPS
300.0000 mg | ORAL_CAPSULE | Freq: Three times a day (TID) | ORAL | Status: DC
  Administered 2012-11-22: 300 mg via ORAL
  Filled 2012-11-21 (×5): qty 1

## 2012-11-21 MED ORDER — FENTANYL CITRATE 0.05 MG/ML IJ SOLN
INTRAMUSCULAR | Status: DC | PRN
  Administered 2012-11-21 (×5): 25 ug via INTRAVENOUS

## 2012-11-21 MED ORDER — FENTANYL CITRATE 0.05 MG/ML IJ SOLN: 25.0000 ug | INTRAMUSCULAR | Status: DC | PRN

## 2012-11-21 MED ORDER — VANCOMYCIN HCL IN DEXTROSE 1-5 GM/200ML-% IV SOLN
INTRAVENOUS | Status: AC
  Filled 2012-11-21: qty 800

## 2012-11-21 MED ORDER — PROPOFOL INFUSION 10 MG/ML OPTIME
INTRAVENOUS | Status: DC | PRN
  Administered 2012-11-21: 50 ug/kg/min via INTRAVENOUS

## 2012-11-21 MED ORDER — LACTATED RINGERS IV SOLN
INTRAVENOUS | Status: DC
  Administered 2012-11-21: 14:00:00 via INTRAVENOUS

## 2012-11-21 MED ORDER — FUROSEMIDE 10 MG/ML IJ SOLN: 40.0000 mg | Freq: Once | INTRAMUSCULAR | Status: DC

## 2012-11-21 MED ORDER — FENTANYL CITRATE 0.05 MG/ML IJ SOLN: 50.0000 ug | INTRAMUSCULAR | Status: DC | PRN

## 2012-11-21 MED ORDER — ENOXAPARIN SODIUM 40 MG/0.4ML ~~LOC~~ SOLN
40.0000 mg | SUBCUTANEOUS | Status: DC
  Administered 2012-11-22: 40 mg via SUBCUTANEOUS
  Filled 2012-11-21 (×2): qty 0.4

## 2012-11-21 MED ORDER — HYDROMORPHONE HCL PF 1 MG/ML IJ SOLN: 0.5000 mg | INTRAMUSCULAR | Status: DC | PRN

## 2012-11-21 MED ORDER — HYDROCODONE-ACETAMINOPHEN 5-325 MG PO TABS
1.0000 | ORAL_TABLET | ORAL | Status: DC | PRN
Start: 2012-11-21 — End: 2012-11-22
  Administered 2012-11-21 – 2012-11-22 (×2): 1 via ORAL
  Filled 2012-11-21 (×2): qty 1

## 2012-11-21 SURGICAL SUPPLY — 39 items
BANDAGE CONFORM 3  STR LF (GAUZE/BANDAGES/DRESSINGS) ×2 IMPLANT
BLADE LONG MED 31X9 (MISCELLANEOUS) ×2 IMPLANT
BNDG COHESIVE 4X5 TAN STRL (GAUZE/BANDAGES/DRESSINGS) ×2 IMPLANT
BNDG COHESIVE 6X5 TAN STRL LF (GAUZE/BANDAGES/DRESSINGS) ×2 IMPLANT
BNDG ESMARK 4X9 LF (GAUZE/BANDAGES/DRESSINGS) ×2 IMPLANT
CHLORAPREP W/TINT 26ML (MISCELLANEOUS) ×2 IMPLANT
CLOTH BEACON ORANGE TIMEOUT ST (SAFETY) ×2 IMPLANT
CONT SPECI 4OZ STER CLIK (MISCELLANEOUS) ×4 IMPLANT
CUFF TOURNIQUET SINGLE 34IN LL (TOURNIQUET CUFF) IMPLANT
CUFF TOURNIQUET SINGLE 44IN (TOURNIQUET CUFF) IMPLANT
DRAPE U-SHAPE 47X51 STRL (DRAPES) ×4 IMPLANT
DRSG ADAPTIC 3X8 NADH LF (GAUZE/BANDAGES/DRESSINGS) ×2 IMPLANT
ELECT REM PT RETURN 9FT ADLT (ELECTROSURGICAL) ×2
ELECTRODE REM PT RTRN 9FT ADLT (ELECTROSURGICAL) ×1 IMPLANT
GLOVE BIO SURGEON STRL SZ8 (GLOVE) ×4 IMPLANT
GLOVE BIOGEL PI IND STRL 8 (GLOVE) ×1 IMPLANT
GLOVE BIOGEL PI INDICATOR 8 (GLOVE) ×1
GOWN PREVENTION PLUS XLARGE (GOWN DISPOSABLE) ×2 IMPLANT
GOWN STRL NON-REIN LRG LVL3 (GOWN DISPOSABLE) ×2 IMPLANT
KIT BASIN OR (CUSTOM PROCEDURE TRAY) ×2 IMPLANT
KIT ROOM TURNOVER OR (KITS) ×2 IMPLANT
MANIFOLD NEPTUNE II (INSTRUMENTS) ×2 IMPLANT
NS IRRIG 1000ML POUR BTL (IV SOLUTION) ×2 IMPLANT
PACK ORTHO EXTREMITY (CUSTOM PROCEDURE TRAY) ×2 IMPLANT
PAD ARMBOARD 7.5X6 YLW CONV (MISCELLANEOUS) ×4 IMPLANT
PAD CAST 4YDX4 CTTN HI CHSV (CAST SUPPLIES) ×1 IMPLANT
PADDING CAST COTTON 4X4 STRL (CAST SUPPLIES) ×1
SPONGE GAUZE 4X4 12PLY (GAUZE/BANDAGES/DRESSINGS) ×2 IMPLANT
SPONGE LAP 18X18 X RAY DECT (DISPOSABLE) ×2 IMPLANT
STAPLER VISISTAT 35W (STAPLE) IMPLANT
STOCKINETTE IMPERVIOUS LG (DRAPES) IMPLANT
SUCTION FRAZIER TIP 10 FR DISP (SUCTIONS) ×2 IMPLANT
SUT ETHILON 2 0 PSLX (SUTURE) IMPLANT
TOWEL OR 17X24 6PK STRL BLUE (TOWEL DISPOSABLE) ×2 IMPLANT
TOWEL OR 17X26 10 PK STRL BLUE (TOWEL DISPOSABLE) ×2 IMPLANT
TUBE CONNECTING 12X1/4 (SUCTIONS) ×2 IMPLANT
TUBE SUCT ARGYLE STRL (TUBING) ×2 IMPLANT
UNDERPAD 30X30 INCONTINENT (UNDERPADS AND DIAPERS) ×2 IMPLANT
WATER STERILE IRR 1000ML POUR (IV SOLUTION) ×2 IMPLANT

## 2012-11-21 NOTE — Consult Note (Signed)
[facility] Medical Consultation  ELO MARMOLEJOS JXB:[id number] DOB: Aug 05, 1927 DOA: 11/20/2012 PCP: Garlan Fillers, MD   Requesting physician: Dr. Victorino Dike Date of consultation: 11/21/12 Reason for consultation: abnormal tracing on cardiac monitor further evaluation and recommendations.  Impression/Recommendations Active Problems: Abnormal EKG Gangrenous toe Hypertension DM   1. Abnormal EKG - At this point not thought to be secondary to high grade av block - type one av block most likely with premature beats. - Reviewed with Cardiologist and would like to thank Dr. Daleen Squibb for his assistance - Will monitor on telemetry - Order magnesium and phosphorus levels - Not atrial fibrillation as there are p waves  2. Gangrenous toe - Per ortho at this juncture - Defer DVT prophylaxis to ortho  3. HTN - At this juncture will continue current regimen and continue to monitor  4. DM - Once able to tolerate po intake would place on diabetic diet  I will followup again tomorrow. Please contact me if I can be of assistance in the meanwhile. Thank you for this consultation.  Chief Complaint: gangrenous toe s/p amputation day 0  HPI: 78 y/o CM with with history of left 2nd toe wound, DM II diet controlled, and HTN presented for elective 2nd toe amputation secondary to left 2nd toe gangrene. Procedure completed and patient had ankle block.  While patient was in recovery nursing noted abnormality on tracing and thus we were consulted for further evaluation and recommendations. Patient denies any chest pain or shortness of breath.  Pain is currently tolerable at this juncture and patient has no complaints at this juncture.   Review of Systems:  All other ROS evaluated except for those mentioned above.  Past Medical History  Diagnosis Date  . CAD (coronary artery disease)     a. s/p CABG 1995;  b. NSTEMI & subsequent BMS to SVG->right PDA 02/03/12.;  c. Cath 02/14/2012 3vd with 4/4  patent grafts and patent stent in vg->pda.  . Cerebrovascular disease, unspecified   . Unspecified essential hypertension   . Other and unspecified hyperlipidemia   . Type II or unspecified type diabetes mellitus without mention of complication, not stated as uncontrolled   . Osteoarthritis (arthritis due to wear and tear of joints)   . Asthmatic bronchitis   . BPH (benign prostatic hypertrophy)   . Herpes zoster   . Arthritis   . Anemia   . Thrombocytopenia   . Aortic stenosis, moderate     a. Echo 02/14/12 EF 55%, Gr 2 DD, Mod AS (mean grad: 29, peak grad 52)  . Splenomegaly 04/13/2012  . History of blood transfusion 04/13/2012  . 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 Dermatitis 06/07/2012    acnieform rash on back 06/06/12  . COPD (chronic obstructive pulmonary disease)   . Heart murmur   . Peripheral vascular disease    Past Surgical History  Procedure Date  . Coronary artery bypass graft 1995  . Rotator cuff repair   . Carpal tunnel release   . Incisional hernia repair   . Kidney surgery   . Back surgery   . I&d extremity 09/16/2012    Procedure: IRRIGATION AND DEBRIDEMENT EXTREMITY;  Surgeon: [name], MD;  Location: [facility]S;  Service: Orthopedics;  Laterality: Left;  irrigation and debridement and first Ray amputation of left foot  . Amputation 09/16/2012    Procedure: AMPUTATION RAY;  Surgeon: [name], MD;  Location: [facility];  Service: Orthopedics;  Laterality: Left;  first Ray  amputation left foot   Social History:  reports that he quit smoking about 27 years ago. His smoking use included Cigarettes and Pipe. He has a 30 pack-year smoking history. He has never used smokeless tobacco. He reports that he does not drink alcohol or use illicit drugs.  Allergies  Allergen Reactions  . Azithromycin Itching  . Macrodantin Itching  . Minocycline Hcl Itching  . Nitrofurantoin Itching  . Zithromax (Azithromycin Dihydrate) Itching  . Contrast Media  (Iodinated Diagnostic Agents) Rash   Family History  Problem Relation Age of Onset  . Coronary artery disease    . Alcohol abuse      Prior to Admission medications   Medication Sig Start Date End Date Taking? Authorizing Provider  acyclovir (ZOVIRAX) 400 MG tablet Take 400 mg by mouth 2 (two) times daily.   Yes Historical Provider, MD  aspirin EC 81 MG tablet Take 81 mg by mouth every morning.    Yes Historical Provider, MD  gabapentin (NEURONTIN) 300 MG capsule Take 300-600 mg by mouth 3 (three) times daily. TAKES 2 CAPSULES IN THE MORNING AND 2 AT LUNCH AND 1 AT NIGHT   Yes Historical Provider, MD  hydrOXYzine (ATARAX/VISTARIL) 25 MG tablet Take 1 tablet (25 mg total) by mouth every 8 (eight) hours as needed for itching. 11/15/12  Yes Rana Snare, NP  isosorbide mononitrate (IMDUR) 30 MG 24 hr tablet Take 1 tablet (30 mg total) by mouth daily. 05/24/12 05/24/13 Yes Gaylord Shih, MD  losartan (COZAAR) 100 MG tablet Take 1 tablet by mouth daily.  10/17/11  Yes Historical Provider, MD  meloxicam (MOBIC) 15 MG tablet Take 15 mg by mouth daily.   Yes Historical Provider, MD  metoprolol succinate (TOPROL-XL) 25 MG 24 hr tablet Take 25 mg by mouth every morning.    Yes Historical Provider, MD  ondansetron (ZOFRAN-ODT) 8 MG disintegrating tablet Place 8 mg under the tongue every 8 (eight) hours as needed. For nausea. 04/13/12  Yes Historical Provider, MD  pravastatin (PRAVACHOL) 40 MG tablet Take 40 mg by mouth daily.   Yes Historical Provider, MD  vitamin B-12 (CYANOCOBALAMIN) 500 MCG tablet Take 500 mcg by mouth daily.   Yes Historical Provider, MD   Physical Exam: Blood pressure 109/47, pulse 73, temperature 97.7 F (36.5 C), temperature source Oral, resp. rate 15, height 6\' 1"  (1.854 m), weight 81 kg (178 lb 9.2 oz), SpO2 98.00%. Filed Vitals:   11/21/12 1655 11/21/12 1700 11/21/12 1715 11/21/12 1730  BP: 109/47     Pulse: 75 73 62 73  Temp: 97.7 F (36.5 C)     TempSrc:      Resp: 19  13 15 15   Height:      Weight:      SpO2: 96% 96% 99% 98%     General:  Pt in NAD, Laying supine, Alert and Awake  Eyes: EOMI, nonicteric  ENT: normal exterior appearance  Neck: no masses, supple  Cardiovascular: S1 and S2 WNL, No Murmurs  Respiratory: CTA BL,no wheezes  Abdomen: soft, NT, ND  Skin: warm and dry  Musculoskeletal: left foot in dressing.    Psychiatric: mood and affect appropriate  Neurologic: moves all extremities and responds to questions appropriately.  Labs on Admission:  Basic Metabolic Panel:  Lab 11/20/12 6213 11/15/12 0941  NA 137 140  K 4.8 4.7  CL 102 107  CO2 26 27  GLUCOSE 142* 113*  BUN 22 29.0*  CREATININE 1.09 1.2  CALCIUM 10.2 9.7  MG -- --  PHOS -- --   Liver Function Tests:  Lab 11/15/12 0941  AST 24  ALT 24  ALKPHOS 153*  BILITOT 0.73  PROT 5.8*  ALBUMIN 3.6   No results found for this basename: LIPASE:5,AMYLASE:5 in the last 168 hours No results found for this basename: AMMONIA:5 in the last 168 hours CBC:  Lab 11/20/12 1948 11/15/12 0940  WBC 5.6 7.2  NEUTROABS -- 5.2  HGB 10.9* 11.4*  HCT 32.4* 33.4*  MCV 99.7 98.8*  PLT 85* 91*   Cardiac Enzymes: No results found for this basename: CKTOTAL:5,CKMB:5,CKMBINDEX:5,TROPONINI:5 in the last 168 hours BNP: No components found with this basename: POCBNP:5 CBG:  Lab 11/21/12 1655 11/21/12 1338 11/21/12 1152 11/21/12 0650 11/20/12 2159  GLUCAP 138* 90 100* 140* 129*    Radiological Exams on Admission: No results found.  EKG: Independently reviewed. 1st degree av block with ectopic beats. No ST elevation or depressions  Time spent: > 55 minutes  Penny Pia [facility] Pager [phone]  If 7PM-7AM, please contact night-coverage [email or web address] Password [facility] 11/21/2012, 5:47 PM

## 2012-11-21 NOTE — Progress Notes (Signed)
Attempted to find patients wife in room and in lobby, but did not find her.  Patient noted to be skipping beats, informed Dr. Victorino Dike, who is contacting hospitalist for a consult.

## 2012-11-21 NOTE — Progress Notes (Signed)
[facility]  Patient Status: Active (receiving services up to time of hospitalization)  [facility] is providing the following services: RN  If patient discharges after hours, please call [phone].   Lanae Crumbly 11/21/2012, 10:04 AM

## 2012-11-21 NOTE — Anesthesia Preprocedure Evaluation (Signed)
Anesthesia Evaluation  Patient identified by MRN, date of birth, ID band Patient awake    Reviewed: Allergy & Precautions, H&P , NPO status , Patient's Chart, lab work & pertinent test results, reviewed documented beta blocker date and time   Airway Mallampati: II TM Distance: >3 FB Neck ROM: Full    Dental No notable dental hx. (+) Teeth Intact and Dental Advisory Given   Pulmonary COPD breath sounds clear to auscultation  Pulmonary exam normal       Cardiovascular hypertension, On Home Beta Blockers and On Medications + CAD, + CABG and + Peripheral Vascular Disease + Valvular Problems/Murmurs AS Rhythm:Regular Rate:Normal + Systolic murmurs    Neuro/Psych  Neuromuscular disease negative neurological ROS  negative psych ROS   GI/Hepatic negative GI ROS, Neg liver ROS,   Endo/Other  diabetes, Type 2, Oral Hypoglycemic Agents  Renal/GU negative Renal ROS  negative genitourinary   Musculoskeletal   Abdominal   Peds  Hematology  (+) anemia ,   Anesthesia Other Findings   Reproductive/Obstetrics negative OB ROS                           Anesthesia Physical Anesthesia Plan  ASA: III  Anesthesia Plan: MAC and Regional   Post-op Pain Management:    Induction: Intravenous  Airway Management Planned: Simple Face Mask  Additional Equipment:   Intra-op Plan:   Post-operative Plan:   Informed Consent: I have reviewed the patients History and Physical, chart, labs and discussed the procedure including the risks, benefits and alternatives for the proposed anesthesia with the patient or authorized representative who has indicated his/her understanding and acceptance.   Dental advisory given  Plan Discussed with: CRNA  Anesthesia Plan Comments:         Anesthesia Quick Evaluation

## 2012-11-21 NOTE — Brief Op Note (Signed)
11/20/2012 - 11/21/2012  4:43 PM  PATIENT:  [name]  78 y.o. male  PRE-OPERATIVE DIAGNOSIS:  Left second toe gangrene  POST-OPERATIVE DIAGNOSIS:  Left second toe gangrene  Procedure(s): Left 2nd toe amputation through the MTP joint  SURGEON:  [name], MD  ASSISTANT: n/a  ANESTHESIA:   Ankle block, MAC  EBL:  minimal   TOURNIQUET:  < 10 min with ankle esmarch  COMPLICATIONS:  None apparent  DISPOSITION:  Extubated, awake and stable to recovery.  DICTATION ID:  [id number]

## 2012-11-21 NOTE — Progress Notes (Signed)
(  late entry)  Patient was seen by hospitalist,  Who has contacted the patients cardiologist.  Cardiologist states that the patient may return to his room, but on telemetry

## 2012-11-21 NOTE — Preoperative (Signed)
Beta Blockers   Reason not to administer Beta Blockers:Not Applicable 

## 2012-11-21 NOTE — Anesthesia Procedure Notes (Signed)
Anesthesia Regional Block:  Ankle block  Pre-Anesthetic Checklist: ,, timeout performed, Correct Patient, Correct Site, Correct Laterality, Correct Procedure, Correct Position, site marked, Risks and benefits discussed, pre-op evaluation, post-op pain management  Laterality: Left  Prep: Maximum Sterile Barrier Precautions used and chloraprep       Needles:  Injection technique: Single-shot  Needle Type: Other     Needle Length: 3cm  Needle Gauge: 25 and 25 G    Additional Needles: Ankle block Narrative:  Start time: 11/21/2012 1:26 PM End time: 11/21/2012 1:35 PM Injection made incrementally with aspirations every 5 mL. Anesthesiologist: Sampson Goon, MD  Additional Notes: Superficial and deep Peroneal, Posterior Tibial, and Saphenous. Negative aspiration of blood prior to all injections.  Ankle block

## 2012-11-21 NOTE — Transfer of Care (Signed)
Immediate Anesthesia Transfer of Care Note  Patient: [name]  Procedure(s) Performed: Procedure(s) (LRB) with comments: AMPUTATION RAY (Left) - LEFT SECOND TOE AMPUTATION  Patient Location: PACU  Anesthesia Type:MAC  Level of Consciousness: awake, alert  and oriented  Airway & Oxygen Therapy: Patient Spontanous Breathing  Post-op Assessment: Report given to PACU RN, Post -op Vital signs reviewed and stable and Patient moving all extremities X 4  Post vital signs: Reviewed and stable  Complications: No apparent anesthesia complications

## 2012-11-22 ENCOUNTER — Encounter (HOSPITAL_COMMUNITY): Payer: Self-pay | Admitting: Physician Assistant

## 2012-11-22 ENCOUNTER — Other Ambulatory Visit: Payer: Medicare Other

## 2012-11-22 ENCOUNTER — Ambulatory Visit: Payer: Medicare Other

## 2012-11-22 DIAGNOSIS — R9431 Abnormal electrocardiogram [ECG] [EKG]: Secondary | ICD-10-CM

## 2012-11-22 LAB — GLUCOSE, CAPILLARY: Glucose-Capillary: 110 mg/dL — ABNORMAL HIGH (ref 70–99)

## 2012-11-22 MED ORDER — LOSARTAN POTASSIUM 50 MG PO TABS
100.0000 mg | ORAL_TABLET | Freq: Every day | ORAL | Status: DC
  Filled 2012-11-22: qty 2

## 2012-11-22 MED ORDER — INSULIN ASPART 100 UNIT/ML ~~LOC~~ SOLN: 0.0000 [IU] | Freq: Every day | SUBCUTANEOUS | Status: DC

## 2012-11-22 MED ORDER — ISOSORBIDE MONONITRATE ER 30 MG PO TB24
30.0000 mg | ORAL_TABLET | Freq: Every day | ORAL | Status: DC
  Filled 2012-11-22: qty 1

## 2012-11-22 MED ORDER — METOPROLOL SUCCINATE ER 25 MG PO TB24
25.0000 mg | ORAL_TABLET | Freq: Every day | ORAL | Status: DC
  Filled 2012-11-22: qty 1

## 2012-11-22 MED ORDER — MAGNESIUM SULFATE IN D5W 10-5 MG/ML-% IV SOLN
1.0000 g | Freq: Once | INTRAVENOUS | Status: AC
  Administered 2012-11-22: 1 g via INTRAVENOUS
  Filled 2012-11-22: qty 100

## 2012-11-22 MED ORDER — INSULIN ASPART 100 UNIT/ML ~~LOC~~ SOLN
0.0000 [IU] | Freq: Three times a day (TID) | SUBCUTANEOUS | Status: DC
  Administered 2012-11-22: 2 [IU] via SUBCUTANEOUS

## 2012-11-22 MED ORDER — SULFAMETHOXAZOLE-TRIMETHOPRIM 800-160 MG PO TABS: 1.0000 | ORAL_TABLET | Freq: Two times a day (BID) | ORAL | Status: DC

## 2012-11-22 MED ORDER — HYDROCODONE-ACETAMINOPHEN 5-325 MG PO TABS: 1.0000 | ORAL_TABLET | Freq: Four times a day (QID) | ORAL | Status: DC | PRN

## 2012-11-22 NOTE — Op Note (Signed)
NAMETAMARA, KENYON NO.:  [id number]  MEDICAL RECORD NO.:  [id number]  LOCATION:  5N12C                        FACILITY:  [facility]  PHYSICIAN:  [name], MD        DATE OF BIRTH:  1927/06/16  DATE OF PROCEDURE:  11/21/2012 DATE OF DISCHARGE:                              OPERATIVE REPORT   PREOPERATIVE DIAGNOSIS:  Left second toe gangrene.  POSTOPERATIVE DIAGNOSIS:  Left second toe gangrene.  PROCEDURE:  Left second toe amputation through the metatarsophalangeal joint.  SURGEON:  [name], MD.  ANESTHESIA:  Ankle block, MAC.  ESTIMATED BLOOD LOSS:  Minimal.  TOURNIQUET TIME:  Less than 10 minutes with an ankle Esmarch.  COMPLICATIONS:  None apparent.  DISPOSITION:  Extubated, awake, and stable to recovery.  INDICATIONS FOR PROCEDURE:  The patient is an 78 year old male who presents now for left second toe amputation with a diagnosis of left second toe gangrene.  He is status post left hallux amputation back in October for gangrene of that toe.  He presented to my office yesterday with cellulitis.  He was admitted last night and started on IV vancomycin.  He understands the risks, benefits, the alternative treatment options and elects surgical treatment.  He specifically understands risks of bleeding, infection, nerve damage, blood clots, need for additional surgery, revision amputation, and death.  PROCEDURE IN DETAIL:  After preoperative consent was obtained and the correct operative site was identified, the patient was brought to the operating room and placed supine on the operating table.  General anesthesia was induced.  Preoperative antibiotics were administered. Surgical time-out was taken.  The left lower extremity was prepped and draped in standard sterile fashion.  A racquet style incision was marked around the base of the second toe.  The extremity was exsanguinated and a 4-inch Esmarch tourniquet was wrapped around the ankle.  The  incision was made.  Sharp dissection was carried down through the skin and subcutaneous tissue to the level of the bone.  Subperiosteal dissection was then carried along the base of the proximal phalanx to the level of MTP joint.  The toe was disarticulated through the MTP joint and passed off the field as a specimen.  Deep tissue was obtained from the tip of the toe and sent to Microbiology for aerobic and anaerobic culture.  The remainder of the toe was sent as a specimen to Pathology.  The wound was then irrigated copiously.  The tourniquet was released.  Hemostasis was achieved.  The incision was then closed with horizontal and vertical mattress sutures of 2-0 nylon.  Sterile dressings were applied followed by compression wrap.  The patient was then awakened from anesthesia and transported to the recovery room in stable condition.  FOLLOWUP PLAN:  The patient will be weightbearing as tolerated and the left foot in a postop shoe.  He will be observed overnight for pain control and for IV antibiotics.  He will likely be discharged home tomorrow morning.     [name], MD     JH/MEDQ  D:  11/21/2012  T:  11/22/2012  Job:  [id number]

## 2012-11-22 NOTE — Discharge Summary (Signed)
Physician Discharge Summary  Patient ID: [name] MRN: [id number] DOB/AGE: Aug 07, 1927 78 y.o.  Admit date: 11/20/2012 Discharge date: 11/22/2012  Admission Diagnoses:  DM, CAD, s/p left hallux amputation.  L 2nd toe gangrene, HTN, cardiac arrythmia  Discharge Diagnoses:  Active Problems:  DIABETES MELLITUS, TYPE II  HYPERTENSION  Gangrenous toe  Abnormal EKG s/p L 2nd toe amputation  Discharged Condition: stable  Hospital Course: Pt was admitted and started on vancomycin IV.  He went to the OR th efollowing day and underwent amputation of the left 2nd gangrenous toe.  He tolerated the procedure well and had IV vanc overnight.  He is discharged to home this morning on oral bactrim DS.  He is WBAT in a hard sole shoe and will f/u with me in two weeks.  He was noted to be in 1deg AV block and had a hosptialist consult.  This was a baseline condition and he was completely stable throughout his hospital stay.  Consults: hospitalist  Significant Diagnostic Studies: labs  Treatments: surgery: as above  Discharge Exam: Blood pressure 128/44, pulse 57, temperature 98.5 F (36.9 C), temperature source Oral, resp. rate 16, height 6\' 1"  (1.854 m), weight 81 kg (178 lb 9.2 oz), SpO2 95.00%. Incision/Wound:  Dressed and dry.  Disposition: to home  Discharge Orders    Future Appointments: Provider: Department: Dept Phone: Center:   11/30/2012 10:30 AM [facility] MEDICAL ONCOLOGY [phone] None   11/30/2012 11:00 AM [facility] [place] [facility] MED[facility] [phone] None   01/09/2013 11:15 AM Gaylord Shih, MD [facility] Main Office [place]) [id number]   01/30/2013 10:00 AM Delcie Roch [facility] MED[facility]Y [phone] None   01/30/2013 10:30 AM Levert Feinstein, MD [facility] MEDICAL ONCOLOGY [phone] None   01/30/2013 11:30 AM [facility] [place] [facility] [phone] None     Future Orders Please Complete By Expires   Diet - low sodium heart healthy      Call MD / Call 911      Comments:   If you experience chest pain or shortness of breath, CALL 911 and be transported to the hospital emergency room.  If you develope a fever above 101 F, pus (white drainage) or increased drainage or redness at the wound, or calf pain, call your surgeon's office.   Constipation Prevention      Comments:   Drink plenty of fluids.  Prune juice may be helpful.  You may use a stool softener, such as Colace (over the counter) 100 mg twice a day.  Use MiraLax (over the counter) for constipation as needed.   Increase activity slowly as tolerated      Weight bearing as tolerated      Comments:   In hard sole post op shoe.       Medication List     As of 11/22/2012  7:46 AM    TAKE these medications         acyclovir 400 MG tablet   Commonly known as: ZOVIRAX   Take 400 mg by mouth 2 (two) times daily.      aspirin EC 81 MG tablet   Take 81 mg by mouth every morning.      gabapentin 300 MG capsule   Commonly known as: NEURONTIN   Take 300-600 mg by mouth 3 (three) times daily. TAKES 2 CAPSULES IN THE MORNING AND 2 AT  LUNCH AND 1 AT NIGHT      HYDROcodone-acetaminophen 5-325 MG per tablet   Commonly known as: NORCO/VICODIN   Take 1-2 tablets by mouth every 6 (six) hours as needed.      hydrOXYzine 25 MG tablet   Commonly known as: ATARAX/VISTARIL   Take 1 tablet (25 mg total) by mouth every 8 (eight) hours as needed for itching.      isosorbide mononitrate 30 MG 24 hr tablet   Commonly known as: IMDUR   Take 1 tablet (30 mg total) by mouth daily.      losartan 100 MG tablet   Commonly known as: COZAAR   Take 1 tablet by mouth daily.      meloxicam 15 MG tablet   Commonly known as: MOBIC   Take 15 mg by mouth daily.      metoprolol succinate 25 MG 24 hr tablet   Commonly known as: TOPROL-XL   Take 25 mg by mouth  every morning.      ondansetron 8 MG disintegrating tablet   Commonly known as: ZOFRAN-ODT   Place 8 mg under the tongue every 8 (eight) hours as needed. For nausea.      pravastatin 40 MG tablet   Commonly known as: PRAVACHOL   Take 40 mg by mouth daily.      sulfamethoxazole-trimethoprim 800-160 MG per tablet   Commonly known as: BACTRIM DS,SEPTRA DS   Take 1 tablet by mouth 2 (two) times daily.      vitamin B-12 500 MCG tablet   Commonly known as: CYANOCOBALAMIN   Take 500 mcg by mouth daily.           Follow-up Information    Follow up with Anselmo Reihl, Jonny Ruiz, MD. Schedule an appointment as soon as possible for a visit in 2 weeks.   Contact information:   [address], [address] [place] [address] [phone]          Signed: [name] 11/22/2012, 7:46 AM

## 2012-11-22 NOTE — Progress Notes (Signed)
Patient noted to have been in a-fib overnight with heart rate ranging in the 60-70's.  Vital signs stable, no complaints of pain, chest pain, SOB, dizziness, or nausea/vomiting.  Spoke with Dr. Shon Baton on call for Dr. Victorino Dike, whom instructed me to speak with [facility] team regarding cardiac status.   Spoke with Maren Reamer, NP on call for [facility].  Order for EKG.  EKG completed, did not show a-fib.  NP up to see patient and review EKG results.

## 2012-11-22 NOTE — Consult Note (Signed)
CARDIOLOGY CONSULT NOTE  Patient ID: [name], MRN: [id number], DOB/AGE: 76-Mar-1928 76 y.o. Admit date: 11/20/2012   Date of Consult: 11/22/2012 Primary Physician: Garlan Fillers, MD Primary Cardiologist: Daleen Squibb  Chief Complaint: redness left toe Reason for Consult: irregular heart rhythm  HPI: [name] is an 78 y/o M with history of CAD s/p CABG and subsequent stenting, moderate AS, HTN, DM, PVD, and Mantle cell lymphoma dx this year receiving chemotherapy. He was hospitalized with osteomyelitis of foot 09/2012 and underwent L first ray amputation; LE dopplers from that time demonstrated chronic DVT. He has chronic anemia/thrombocytopenia. He was admitted 11/20/12 with complaints of redness at this left 2nd toe with drainage. He was found to have gangrene/cellulitis and started on IV vancomycin and underwent L 2nd toe amputation yesterday. Post operatively he was found to have an irregular heart rhythm on telemetry. This has been noted in prior admissions as well. P wave activity is present, so this is not felt to be atrial fibrillation. He currently is in NSR with occasional PACs and atrial bigeminy. Mg is low at 1.3. Last K 4.8. He was given 1 g Mag sulfate overnight. He got his metoprolol yesterday AM, but his Imdur/Cozaar have been held. BB held this AM. The patient is completely unaware of his heart rhythm. He denies any palpitations, SOB, chest pain, nausea, diaphoresis, presyncope, dizziness, LEE or orthopnea. He is actually eager to go home.  Past Medical History  Diagnosis Date  . CAD (coronary artery disease)     a. s/p CABG 1995;  b. NSTEMI & subsequent BMS to SVG->right PDA 02/03/12.;  c. Cath 02/14/2012 3vd with 4/4 patent grafts and patent stent in vg->pda.  . Cerebrovascular disease, unspecified     right ICA < 60%, left ICA < 40%, 01/2012  . Unspecified essential hypertension   . Other and unspecified hyperlipidemia   . Type II or unspecified type diabetes mellitus  without mention of complication, not stated as uncontrolled   . Osteoarthritis (arthritis due to wear and tear of joints)   . Asthmatic bronchitis   . BPH (benign prostatic hypertrophy)   . Herpes zoster   . Anemia   . Thrombocytopenia     a. secondary to splenomegaly related to lymphoma with suspicion of bone marrow involvement. (Plavix had to be stopped due to this)  . Aortic stenosis, moderate     a. Echo 02/14/12 EF 55%, Gr 2 DD, Mod AS (mean grad: 29, peak grad 52)  . Splenomegaly 04/13/2012  . History of blood transfusion 04/13/2012  . 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 Dermatitis 06/07/2012    acnieform rash on back 06/06/12  . COPD (chronic obstructive pulmonary disease)   . Heart murmur   . Peripheral vascular disease   . Pulmonary nodule     8mm RUL calcified pulm nodule noted on CT staging for lymphoma - stable 10/2012.  . Leg DVT (deep venous thromboembolism), chronic     LE dopplers 09/2012: chronic DVT involving right mid femoral vein, left mid femoral vein, and left popliteal vein.  . Osteomyelitis     L first foot ray amputation 09/2012  . Gangrene     L 2nd toe amputation 11/2012      Most Recent Cardiac Studies: LE Venous Dopplers 09/2012 Summary: - No evidence of acute deep vein thrombosis bilaterally. - Findings consistent with chronic deep vein thrombosis involving the right mid femoral  vein, left mid femoral vein, and left popliteal vein. - No evidence of Baker's cyst on the right or left.  2D Echo 02/2012 Study Conclusions - Left ventricle: The cavity size was normal. Wall thickness was increased in a pattern of mild LVH. The estimated ejection fraction was 55%. Wall motion was normal; there were no regional wall motion abnormalities. Features are consistent with a pseudonormal left ventricular filling pattern, with concomitant abnormal relaxation and increased filling pressure (grade  2 diastolic dysfunction). Doppler parameters are consistent with high ventricular filling pressure. - Aortic valve: There was moderate stenosis. Mean gradient: 29mm Hg (S). Peak gradient: 52mm Hg (S). - Mitral valve: Moderately calcified annulus. - Left atrium: The atrium was mildly dilated. - Right ventricle: The cavity size was mildly dilated. - Right atrium: The atrium was mildly dilated. - Pulmonary arteries: PA peak pressure: 43mm Hg (S).  Cardiac Cath 02/2012 [facility] FINDINGS  Hemodynamics:  AO 89/39  LV NA  Coronary angiography:  Coronary dominance: right  Left mainstem: Occluded (There was minimal flow demonstrated in the previous cath.)  Left anterior descending (LAD): Heavy calcification with total proximal occlusion of the LAD. The vessel fills via a vein graft. Moderate first diagonal with 25% stenosis. Small second diagonal free of high grade disease.  Left circumflex (LCx): Heavy calcification with total proximal occlusion. OM 1 large and branching with prox 95% stenosis. Superior and inferior branches after the graft anastomosis have diffuse mild/moderate non obstructive plaque. OM2 is large with diffuse luminal irregularities. PL small with moderate diffuse disease.  Right coronary artery (RCA): The RCA is heavily calcified. There is diffuse mid vessel disease noted. The RCA is totally occluded at the junction of the mid and distal vessel just after the acute marginal branch . The distal anastomotic site appears intact to the PDA and is retrograde fills the posterolateral branch. PDA is small to moderate and free of high grade disease.  Saphenous vein graft to PDA: There is a proximal patent stent. The remaining portions of the saphenous vein graft has diffuse nonobstructive disease noted but is widely patent to the PDA.  Saphenous vein graft sequential to the left circumflex: This vein graft is smooth throughout its course. It is sequential to two branches of a large first  OM and a MOM. The graft is widely patent and the distal anastomotic sites are all widely patent.  LIMA: Widely patent  Left ventriculography: NA (The valve was not crossed for pressures as this was done recently with the previous cath.)  Final Conclusions:  1. Severe three-vessel native coronary artery disease. LM is now occluded. There was scant flow through this previously.  2. Wide patency of the LIMA to LAD and saphenous vein graft sequenced to obtuse marginal branches left circumflex. Widely patent recent stent in the SVG to the RCA.  Plan:  Continued medical    Surgical History:  Past Surgical History  Procedure Date  . Coronary artery bypass graft 1995  . Rotator cuff repair   . Carpal tunnel release   . Incisional hernia repair   . Kidney surgery   . Back surgery   . I&d extremity 09/16/2012    Procedure: IRRIGATION AND DEBRIDEMENT EXTREMITY;  Surgeon: [name], MD;  Location: [facility] ORS;  Service: Orthopedics;  Laterality: Left;  irrigation and debridement and first Ray amputation of left foot  . Amputation 09/16/2012    Procedure: AMPUTATION RAY;  Surgeon: [name], MD;  Location: [facility] ORS;  Service: Orthopedics;  Laterality: Left;  first Ray amputation left foot     Home Meds: Prior to Admission medications   Medication Sig Start Date End Date Taking? Authorizing Provider  acyclovir (ZOVIRAX) 400 MG tablet Take 400 mg by mouth 2 (two) times daily.   Yes Historical Provider, MD  aspirin EC 81 MG tablet Take 81 mg by mouth every morning.    Yes Historical Provider, MD  gabapentin (NEURONTIN) 300 MG capsule Take 300-600 mg by mouth 3 (three) times daily. TAKES 2 CAPSULES IN THE MORNING AND 2 AT LUNCH AND 1 AT NIGHT   Yes Historical Provider, MD  hydrOXYzine (ATARAX/VISTARIL) 25 MG tablet Take 1 tablet (25 mg total) by mouth every 8 (eight) hours as needed for itching. 11/15/12  Yes Rana Snare, NP  isosorbide mononitrate (IMDUR) 30 MG 24 hr tablet Take 1 tablet (30 mg total)  by mouth daily. 05/24/12 05/24/13 Yes Gaylord Shih, MD  losartan (COZAAR) 100 MG tablet Take 1 tablet by mouth daily.  10/17/11  Yes Historical Provider, MD  meloxicam (MOBIC) 15 MG tablet Take 15 mg by mouth daily.   Yes Historical Provider, MD  metoprolol succinate (TOPROL-XL) 25 MG 24 hr tablet Take 25 mg by mouth every morning.    Yes Historical Provider, MD  ondansetron (ZOFRAN-ODT) 8 MG disintegrating tablet Place 8 mg under the tongue every 8 (eight) hours as needed. For nausea. 04/13/12  Yes Historical Provider, MD  pravastatin (PRAVACHOL) 40 MG tablet Take 40 mg by mouth daily.   Yes Historical Provider, MD  vitamin B-12 (CYANOCOBALAMIN) 500 MCG tablet Take 500 mcg by mouth daily.   Yes Historical Provider, MD  HYDROcodone-acetaminophen (NORCO/VICODIN) 5-325 MG per tablet Take 1-2 tablets by mouth every 6 (six) hours as needed. 11/22/12   [name], MD  sulfamethoxazole-trimethoprim (BACTRIM DS) 800-160 MG per tablet Take 1 tablet by mouth 2 (two) times daily. 11/22/12   [name], MD    Inpatient Medications:     . acyclovir  400 mg Oral BID  . vitamin B-12  500 mcg Oral Daily  . enoxaparin (LOVENOX) injection  40 mg Subcutaneous Q24H  . gabapentin  300 mg Oral TID  . insulin aspart  0-15 Units Subcutaneous TID WC  . insulin aspart  0-5 Units Subcutaneous QHS  . vancomycin  750 mg Intravenous Q12H    Allergies:  Allergies  Allergen Reactions  . Azithromycin Itching  . Macrodantin Itching  . Minocycline Hcl Itching  . Nitrofurantoin Itching  . Zithromax (Azithromycin Dihydrate) Itching  . Contrast Media (Iodinated Diagnostic Agents) Rash    History   Social History  . Marital Status: Married    Spouse Name: N/A    Number of Children: N/A  . Years of Education: N/A   Occupational History  . retired [employer]   Social History Main Topics  . Smoking status: Former Smoker -- 1.0 packs/day for 30 years    Types: Cigarettes, Pipe    Quit date: 03/29/1985  .  Smokeless tobacco: Never Used     Comment: quit in 1986  . Alcohol Use: No  . Drug Use: No  . Sexually Active: Not Currently   Other Topics Concern  . Not on file   Social History Narrative  . No narrative on file     Family History  Problem Relation Age of Onset  . Coronary artery disease    . Alcohol abuse       Review of Systems: General: negative for chills, fever, night sweats  Cardiovascular: see above Dermatological: itching ever since he started chemo Respiratory: negative for cough Urologic: negative for hematuria Abdominal: negative for nausea, vomiting, diarrhea, bright red blood per rectum, melena, or hematemesis Neurologic: negative for visual changes, syncope, or dizziness All other systems reviewed and are otherwise negative except as noted above.  Labs:  Lab Results  Component Value Date   WBC 5.6 11/20/2012   HGB 10.9* 11/20/2012   HCT 32.4* 11/20/2012   MCV 99.7 11/20/2012   PLT 85* 11/20/2012     Lab 11/20/12 1948  NA 137  K 4.8  CL 102  CO2 26  BUN 22  CREATININE 1.09  CALCIUM 10.2  PROT --  BILITOT --  ALKPHOS --  ALT --  AST --  GLUCOSE 142*   Lab Results  Component Value Date   CHOL 111 02/02/2012   HDL 24* 02/02/2012   LDLCALC 72 02/02/2012   TRIG 76 02/02/2012    Radiology/Studies:  No results found.  EKG: 11/21/12: irregular heart rhythm 74bpm with apparent P wave activity, nonspecific ST-T changes 11/22/12: atrial bigeminy 58bpm, nonspecific ST-T changes  Physical Exam: Blood pressure 128/44, pulse 57, temperature 98.5 F (36.9 C), temperature source Oral, resp. rate 16, height 6\' 1"  (1.854 m), weight 178 lb 9.2 oz (81 kg), SpO2 95.00%. General: Well developed, well nourished WM in no acute distress. Head: Normocephalic, atraumatic, sclera non-icteric, no xanthomas, nares are without discharge.  Neck: Bilateral carotid bruits. JVD not elevated. Lungs: Coarse BS throughout. No wheezes, rales. Initially had quiet rhonchi  cleared with cough. Breathing is unlabored. Heart: Irregular rhythm, controlled rate with S1 S2. SEM noted at both RUSB and apex. No rubs or gallops appreciated. Abdomen: Soft, non-tender, non-distended with normoactive bowel sounds. No hepatomegaly. No rebound/guarding. No obvious abdominal masses. Msk:  Strength and tone appear normal for age. Extremities: No clubbing or cyanosis. No edema. No erythema. L foot wrapped in ACE. Distal pedal pulses in tact on R, unable to assess L. Neuro: Alert and oriented X 3. No facial asymmetry. No focal deficit. Moves all extremities spontaneously. Psych:  Responds to questions appropriately with a normal affect.   Assessment and Plan:   1. L 2nd toe gangrene s/p amputation POD#1 - doing well. 2. Arrhythmia - stable. Appears to be NSR with PACs/atrial arrhythmia but not atrial fibrillation or flutter since P wave activity is present. Continue BB. No further intervention necessary at present. Asymptomatic.  3. CAD s/p CABG & PCI - stable. No ischemic symptoms. Continue BB, statin. Resume ASA when OK with surgery.  4. Mantle cell lymphoma with anemia, thrombocytopenia - followed by onc. He reports itching since starting most recent chemotherapy - will defer to primary team regarding further follow-up. 5. R&L chronic DVT noted 09/2012 - do not see comments regarding this. Would appreciate internal med input. Not currently on anticoagulant therapy. Discussed with Dr. Eden Emms - will arrange f/u LE dopplers in 4 weeks in our office to follow and followup appt (12/21/12 at 12:30pm - listed in D/C section in Epic). 6. Moderate aortic stenosis - stable. Will follow as OP. 7. Hypomagnesemia - ?r/t surgery. Received IV magnesium which Dr. Eden Emms feels is sufficient. Will obtain f/u magnesium level next week in our office - 12/23 anytime between 8:30-4pm - also put in D/C section in Epic. 8. Diabetes mellitus with vascular complications - see above.  9. HTN, controlled -  continue home medications.  Signed, Ronie Spies PA-C 11/22/2012, 9:52 AM  Patient examined chart reviewed.  Asymptomatic  PAC;s and atrial arrhythmia.  Resume Toprol.  No need for further w/u.  Will arrange f/u venousl duplex in our office regarding DVT and need for anticoagulation.  Resume ASA.  Continue antibiotics at home  His pulses are plus 3 at the popliteal level so no PVD proximally.  Patient and wife anxious to go home  [name] 1:02 PM 11/22/2012

## 2012-11-22 NOTE — Progress Notes (Signed)
After reading the cardiology note, Dr. Victorino Dike was contacted. He confirmed that the ASA and walking until the patient has his follow up appointment with Dr. Anola Gurney office for this dopplers.  Patient is being discharged.

## 2012-11-22 NOTE — Anesthesia Postprocedure Evaluation (Signed)
  Anesthesia Post-op Note  Patient: [name]  Procedure(s) Performed: Procedure(s) (LRB) with comments: AMPUTATION RAY (Left) - LEFT SECOND TOE AMPUTATION  Patient Location: PACU  Anesthesia Type:MAC and MAC combined with regional for post-op pain  Level of Consciousness: awake  Airway and Oxygen Therapy: Patient Spontanous Breathing  Post-op Pain: none  Post-op Assessment: Post-op Vital signs reviewed, Patient's Cardiovascular Status Stable, Respiratory Function Stable, Patent Airway, No signs of Nausea or vomiting and Pain level controlled  Post-op Vital Signs: stable  Complications: No apparent anesthesia complications

## 2012-11-22 NOTE — Progress Notes (Signed)
UR COMPLETED  

## 2012-11-22 NOTE — Progress Notes (Signed)
Orthopedic Tech Progress Note Patient Details:  [name] January 01, 1927 [id number]  Ortho Devices Type of Ortho Device: Postop shoe/boot   Haskell Flirt 11/22/2012, 5:38 AM

## 2012-11-22 NOTE — Progress Notes (Signed)
CC: asked to see pt by ortho 2/2 arrythmia on tele  HPI:  Pt admitted with cellulitis of toe and underwent amputation of said toe 11/21/12 by orthopaedics. On 11/21/12, the pt developed ? Afib on tele and [facility] hospitalists were consulted by ortho. Dr. Cena Benton saw pt and it was determined that the pt was NOT in Afib as there were Pwaves on the 12 lead EKG. However, there was evidence of arrythmia and 1st deg AV block.  Tonight, the RN caring for the pt called Dr. Shon Baton (ortho) because the tele was again showing Afib with a controlled rate. RN also concerned about pt not having preop meds restarted yet in the post op period. Dr. Shon Baton asked RN to ask [facility] to again look at this issue. This NP spoke to Dr. Shon Baton about plan of care.  NP ordered 12 lead repeated and went to the bedside.  Subjective: Pt denies any SOB, dizziness, palpitations, or chest pain. He says he feels fine.    Objective: Vital signs in last 24 hours: Temp:  [97.3 F (36.3 C)-98.4 F (36.9 C)] 97.6 F (36.4 C) (12/17 2157) Pulse Rate:  [57-80] 80  (12/17 2157) Resp:  [13-22] 18  (12/17 2157) BP: (109-176)/(47-72) 144/70 mmHg (12/17 2157) SpO2:  [96 %-99 %] 96 % (12/17 2157) Weight change:  Last BM Date: 11/20/12  Intake/Output from previous day: 12/17 0701 - 12/18 0700 In: 922 [P.O.:222; I.V.:700] Out: 111 [Urine:100; Stool:1; Blood:10] Intake/Output this shift: Total I/O In: -  Out: 1 [Stool:1]  PE:  Gen: well appearing elderly WM resting comfortably in bed.  Card: irreg rhythm with normal rate. S1 and S2 heard. 12 lead reviewed.  Resp: normal effort and rate Psych/neuro: alert and oriented x 3. Pleasant and cooperative.  No CN focal deficits noted.   Lab Results:  [facility] 11/20/12 1948  WBC 5.6  HGB 10.9*  HCT 32.4*  PLT 85*   BMET  Basename 11/20/12 1948  NA 137  K 4.8  CL 102  CO2 26  GLUCOSE 142*  BUN 22  CREATININE 1.09  CALCIUM 10.2    Studies/Results: No results  found.  Medications: meds reviewed  Assessment/Plan:  1. Cardiac arrythmia, 1st deg AV block. EKG with Pwaves, so again, this is not Afib. Rate is well controlled in the 60-70s. BP is stable. Pt is non symptomatic. I reviewed EKGs back in October of 2013 which also show 1st deg AV block. I noticed his Mg is a little low, so we will replete that. I discussed issue with Armanda Magic, MD, cardiac fellow on call, who agrees with plan to hold his meds until further evaluation. Even though pt has hx of CAD, we will hold his night time BB given the block and reeval tomorrow. Pt is not in distress, O2 sats are normal and he is having NO chest pain, so will also hold Imdur and Cozaar for now and add back slowly in periop period.    LOS: 2 days   [name], [name] 11/22/2012, 12:17 AM

## 2012-11-23 ENCOUNTER — Other Ambulatory Visit: Payer: Self-pay | Admitting: *Deleted

## 2012-11-23 ENCOUNTER — Encounter (HOSPITAL_COMMUNITY): Payer: Self-pay | Admitting: Orthopedic Surgery

## 2012-11-23 ENCOUNTER — Telehealth: Payer: Self-pay | Admitting: *Deleted

## 2012-11-23 NOTE — Telephone Encounter (Signed)
Received call from pt's wife stating that pt just came home yest from the hosp after having another toe amputated.  She states that she noticed that the appt for rituxan is still on pt's schedule for 11/30/12 & wants to make sure this is OK with Dr Cyndie Chime.  She asked for call back @ [phone].  Note to Dr Cyndie Chime.

## 2012-11-24 ENCOUNTER — Other Ambulatory Visit: Payer: Self-pay | Admitting: Oncology

## 2012-11-24 ENCOUNTER — Telehealth: Payer: Self-pay | Admitting: *Deleted

## 2012-11-24 LAB — TISSUE CULTURE: Gram Stain: NONE SEEN

## 2012-11-24 NOTE — Telephone Encounter (Signed)
Per staff message and POF I have scheduled appts.  JMW  

## 2012-11-24 NOTE — Progress Notes (Signed)
Orthopedic Tech Progress Note Patient Details:  [name] [birth year] [id number] Canceled charge for post op shoe. Patient ID: HOA DERISO, male   DOB: [birth year], 78 y.o.   MRN: [id number]   [name] 11/24/2012, 3:15 PM

## 2012-11-27 ENCOUNTER — Ambulatory Visit: Payer: Medicare Other

## 2012-11-27 ENCOUNTER — Telehealth: Payer: Self-pay | Admitting: Oncology

## 2012-11-27 ENCOUNTER — Ambulatory Visit (INDEPENDENT_AMBULATORY_CARE_PROVIDER_SITE_OTHER): Payer: Medicare Other | Admitting: *Deleted

## 2012-11-27 DIAGNOSIS — E785 Hyperlipidemia, unspecified: Secondary | ICD-10-CM

## 2012-11-27 DIAGNOSIS — E119 Type 2 diabetes mellitus without complications: Secondary | ICD-10-CM

## 2012-11-27 NOTE — Telephone Encounter (Signed)
Talked to patient's wife and gave her appt for 12/08/12 lab and chemo

## 2012-11-28 ENCOUNTER — Telehealth: Payer: Self-pay | Admitting: Oncology

## 2012-11-28 NOTE — Telephone Encounter (Signed)
Talked to patient he is aware of appt on 12/08/12 lab and chemo

## 2012-11-29 LAB — ANAEROBIC CULTURE

## 2012-11-30 ENCOUNTER — Ambulatory Visit: Payer: Medicare Other

## 2012-11-30 ENCOUNTER — Other Ambulatory Visit: Payer: Medicare Other | Admitting: Lab

## 2012-12-05 ENCOUNTER — Telehealth: Payer: Self-pay | Admitting: *Deleted

## 2012-12-05 NOTE — Telephone Encounter (Signed)
Received call from Ms. Romanello who is concerned about insurance & whether medicare will cover [place]'s rituxan.  She reports that they can't afford if medicare doesn't cover.  Transferred call to [facility] in [facility] to discuss.

## 2012-12-08 ENCOUNTER — Ambulatory Visit (HOSPITAL_BASED_OUTPATIENT_CLINIC_OR_DEPARTMENT_OTHER): Payer: Medicare Other

## 2012-12-08 ENCOUNTER — Other Ambulatory Visit (HOSPITAL_BASED_OUTPATIENT_CLINIC_OR_DEPARTMENT_OTHER): Payer: Medicare Other | Admitting: Lab

## 2012-12-08 VITALS — BP 101/52 | HR 54 | Temp 98.1°F | Resp 18

## 2012-12-08 DIAGNOSIS — Z5112 Encounter for antineoplastic immunotherapy: Secondary | ICD-10-CM

## 2012-12-08 DIAGNOSIS — R161 Splenomegaly, not elsewhere classified: Secondary | ICD-10-CM

## 2012-12-08 DIAGNOSIS — C8590 Non-Hodgkin lymphoma, unspecified, unspecified site: Secondary | ICD-10-CM

## 2012-12-08 DIAGNOSIS — C8313 Mantle cell lymphoma, intra-abdominal lymph nodes: Secondary | ICD-10-CM

## 2012-12-08 LAB — CBC WITH DIFFERENTIAL/PLATELET
BASO%: 0.3 % (ref 0.0–2.0)
Eosinophils Absolute: 0.2 10*3/uL (ref 0.0–0.5)
HCT: 35.4 % — ABNORMAL LOW (ref 38.4–49.9)
LYMPH%: 15.1 % (ref 14.0–49.0)
MCHC: 33.9 g/dL (ref 32.0–36.0)
MCV: 100.3 fL — ABNORMAL HIGH (ref 79.3–98.0)
MONO%: 12.3 % (ref 0.0–14.0)
NEUT%: 69.4 % (ref 39.0–75.0)
Platelets: 106 10*3/uL — ABNORMAL LOW (ref 140–400)
RBC: 3.53 10*6/uL — ABNORMAL LOW (ref 4.20–5.82)
nRBC: 0 % (ref 0–0)

## 2012-12-08 MED ORDER — SODIUM CHLORIDE 0.9 % IV SOLN
375.0000 mg/m2 | Freq: Once | INTRAVENOUS | Status: AC
  Administered 2012-12-08: 800 mg via INTRAVENOUS
  Filled 2012-12-08: qty 80

## 2012-12-08 MED ORDER — SODIUM CHLORIDE 0.9 % IV SOLN
Freq: Once | INTRAVENOUS | Status: AC
  Administered 2012-12-08: 09:00:00 via INTRAVENOUS

## 2012-12-08 MED ORDER — DIPHENHYDRAMINE HCL 25 MG PO CAPS
50.0000 mg | ORAL_CAPSULE | Freq: Once | ORAL | Status: AC
  Administered 2012-12-08: 50 mg via ORAL

## 2012-12-08 MED ORDER — ACETAMINOPHEN 325 MG PO TABS
650.0000 mg | ORAL_TABLET | Freq: Once | ORAL | Status: AC
  Administered 2012-12-08: 650 mg via ORAL

## 2012-12-08 NOTE — Patient Instructions (Signed)
[facility] Discharge Instructions for Patients Receiving Chemotherapy  Today you received the following biootherapy agents: Rituxan.   If you develop bleeding that is not easily controlled, call the clinic. If it is after clinic hours your family physician or the after hours number for the clinic or go to the Emergency Department.   BELOW ARE SYMPTOMS THAT SHOULD BE REPORTED IMMEDIATELY:  *FEVER GREATER THAN 100.5 F  *CHILLS WITH OR WITHOUT FEVER  NAUSEA AND VOMITING THAT IS NOT CONTROLLED WITH YOUR NAUSEA MEDICATION  *UNUSUAL SHORTNESS OF BREATH  *UNUSUAL BRUISING OR BLEEDING  TENDERNESS IN MOUTH AND THROAT WITH OR WITHOUT PRESENCE OF ULCERS  *URINARY PROBLEMS  *BOWEL PROBLEMS  UNUSUAL RASH Items with * indicate a potential emergency and should be followed up as soon as possible.  Feel free to call the clinic you have any questions or concerns. The clinic phone number is [phone].   I have been informed and understand all the instructions given to me. I know to contact the clinic, my physician, or go to the Emergency Department if any problems should occur. I do not have any questions at this time, but understand that I may call the clinic during office hours   should I have any questions or need assistance in obtaining follow up care.

## 2012-12-12 ENCOUNTER — Telehealth: Payer: Self-pay | Admitting: Oncology

## 2012-12-12 NOTE — Telephone Encounter (Signed)
Pt is aware of appt for February 2014 lab , MD and chemo

## 2012-12-18 ENCOUNTER — Other Ambulatory Visit: Payer: Self-pay

## 2012-12-18 DIAGNOSIS — I739 Peripheral vascular disease, unspecified: Secondary | ICD-10-CM

## 2012-12-21 ENCOUNTER — Encounter (INDEPENDENT_AMBULATORY_CARE_PROVIDER_SITE_OTHER): Payer: Medicare Other

## 2012-12-21 ENCOUNTER — Ambulatory Visit (INDEPENDENT_AMBULATORY_CARE_PROVIDER_SITE_OTHER): Payer: Medicare Other | Admitting: Physician Assistant

## 2012-12-21 ENCOUNTER — Encounter: Payer: Self-pay | Admitting: Physician Assistant

## 2012-12-21 VITALS — BP 122/54 | HR 56 | Ht 73.0 in | Wt 183.8 lb

## 2012-12-21 DIAGNOSIS — I739 Peripheral vascular disease, unspecified: Secondary | ICD-10-CM

## 2012-12-21 DIAGNOSIS — E785 Hyperlipidemia, unspecified: Secondary | ICD-10-CM

## 2012-12-21 DIAGNOSIS — I25810 Atherosclerosis of coronary artery bypass graft(s) without angina pectoris: Secondary | ICD-10-CM

## 2012-12-21 DIAGNOSIS — I10 Essential (primary) hypertension: Secondary | ICD-10-CM

## 2012-12-21 DIAGNOSIS — Z86718 Personal history of other venous thrombosis and embolism: Secondary | ICD-10-CM

## 2012-12-21 DIAGNOSIS — I359 Nonrheumatic aortic valve disorder, unspecified: Secondary | ICD-10-CM

## 2012-12-21 DIAGNOSIS — I82409 Acute embolism and thrombosis of unspecified deep veins of unspecified lower extremity: Secondary | ICD-10-CM

## 2012-12-21 DIAGNOSIS — I87009 Postthrombotic syndrome without complications of unspecified extremity: Secondary | ICD-10-CM

## 2012-12-21 NOTE — Progress Notes (Signed)
[address], [address] [place], [place]  [address] Phone: [phone], Fax:  [phone]  Date:  12/21/2012   Name:  [name]   DOB:  11-27-1927   MRN:  [id number]  PCP:  Garlan Fillers, MD  Primary Cardiologist:  Dr. Valera Castle    History of Present Illness: [name] is a 79 y.o. male who returns for follow up after recent hospital admission for gangrenous left 2nd toe, s/p amputation.  He has a hx of CAD, s/p CABG, s/p NSTEMI 2/13 => PCI with BMS to the S-PDA, moderate AS, DM2, HTN, PAD and Mantle Cell Lymphoma (s/p chemoTx with Bendamustine, now on Rituxan), anemia and thrombocytopenia.  Last LHC 3/13: severe native 3 v CAD, patent S-PDA stent, S-CFX patent, L-LAD patent.  Echo 3/13:  Mild LVH, EF 55%, Gr 2 diast dysfn, mod AS, mean 29 mmHg, peak 52 mmHg, MAC, mild LAE, mild RVE, mild RAE, PASP 43.  Carotid dopplers 2/13: RICA 40-59%, LICA 0-39% (repeat due in 01/2013).  Patient had osteomyelitis 09/2012 and underwent left 1st ray amputation.    Recently admitted 12/16-12/18 with gangrenous left 2nd toe and ultimately underwent amputation.  Cardiology was asked to evaluate during admission for an irregular rhythm.  This was reviewed and felt to represent NSR with PACs and atrial arrhythmia.  No AFib was identified.  Note was made of LE dopplers in 09/2012 demonstrating chronic DVT right mid femoral vein, left mid femoral vein and left popliteal vein.  No acute DVT.  He was set up for follow up today with a repeat venous doppler.  Of note, in review of records, patient had venous doppler in 04/2012 with similar findings.    He is doing well.  No leg pain or edema.  The patient denies chest pain, shortness of breath, syncope, orthopnea, PND.  States he is feeling good.    Labs (2/13):     LDL 72, TSH 1.290 Labs (10/13):   K 4.3, creatinine 1, Hgb 11.6, PLT 131 Labs (11/13):   K 4.2, creatinine 0.9, ALT 19, Hgb 11.5, PLT 64 Labs (12/13):   K 4.8, creatinine  1.09, Mg 1.3=>1.6, Hgb 10.9, PLT 85 Labs (1/14):     Hgb 12.0, PLT 106  Wt Readings from Last 3 Encounters:  12/21/12 183 lb 12.8 oz (83.371 kg)  11/20/12 178 lb 9.2 oz (81 kg)  11/20/12 178 lb 9.2 oz (81 kg)     Past Medical History  Diagnosis Date  . CAD (coronary artery disease)     a. s/p CABG 1995;  b. NSTEMI & subsequent BMS to SVG->right PDA 02/03/12.;  c. Cath 02/14/2012 3vd with 4/4 patent grafts and patent stent in vg->pda.  . Carotid stenosis     a. Carotid dopplers 2/13: RICA 40-59%, LICA 0-39% (repeat due in 01/2013).  Marland Kitchen HTN (hypertension)   . HLD (hyperlipidemia)   . DM2 (diabetes mellitus, type 2)   . DJD (degenerative joint disease)   . Asthmatic bronchitis   . BPH (benign prostatic hypertrophy)   . Herpes zoster   . Anemia   . Thrombocytopenia     a. secondary to splenomegaly related to lymphoma with suspicion of bone marrow involvement. (Plavix had to be stopped due to this)  . Aortic stenosis, moderate     a. Echo 02/14/12 EF 55%, Gr 2 DD, Mod AS (mean grad: 29, peak grad 52)  . Splenomegaly 04/13/2012  . History of blood transfusion 04/13/2012  . 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 Dermatitis 06/07/2012    acnieform rash on back 06/06/12  . COPD (chronic obstructive pulmonary disease)   . Peripheral vascular disease     a. s/p L ray amp 10/13;  b. s/p L 2nd toe amp 12/13  . Pulmonary nodule     8mm RUL calcified pulm nodule noted on CT staging for lymphoma - stable 10/2012.  . Leg DVT (deep venous thromboembolism), chronic     LE dopplers 5/13, 10/13 and 1/14: chronic DVT involving right mid femoral vein, left mid femoral vein, and left popliteal vein.  . Osteomyelitis     L first foot ray amputation 09/2012  . Gangrene     L 2nd toe amputation 11/2012    Current Outpatient Prescriptions  Medication Sig Dispense Refill  . acyclovir (ZOVIRAX) 400 MG tablet Take 400 mg by mouth 2 (two)  times daily.      Marland Kitchen aspirin EC 81 MG tablet Take 81 mg by mouth every morning.       . gabapentin (NEURONTIN) 300 MG capsule Take 300-600 mg by mouth 3 (three) times daily. TAKES 2 CAPSULES IN THE MORNING AND 2 AT LUNCH AND 1 AT NIGHT      . isosorbide mononitrate (IMDUR) 30 MG 24 hr tablet Take 1 tablet (30 mg total) by mouth daily.  30 tablet  11  . losartan (COZAAR) 100 MG tablet Take 1 tablet by mouth daily.       . meloxicam (MOBIC) 15 MG tablet Take 15 mg by mouth daily.      . metoprolol succinate (TOPROL-XL) 25 MG 24 hr tablet Take 25 mg by mouth every morning.       . pravastatin (PRAVACHOL) 40 MG tablet Take 40 mg by mouth daily.      . vitamin B-12 (CYANOCOBALAMIN) 500 MCG tablet Take 500 mcg by mouth daily.      Marland Kitchen HYDROcodone-acetaminophen (NORCO/VICODIN) 5-325 MG per tablet Take 1-2 tablets by mouth every 6 (six) hours as needed.  30 tablet  0  . hydrOXYzine (ATARAX/VISTARIL) 25 MG tablet Take 1 tablet (25 mg total) by mouth every 8 (eight) hours as needed for itching.  30 tablet  0  . ondansetron (ZOFRAN-ODT) 8 MG disintegrating tablet Place 8 mg under the tongue every 8 (eight) hours as needed. For nausea.      Marland Kitchen sulfamethoxazole-trimethoprim (BACTRIM DS) 800-160 MG per tablet Take 1 tablet by mouth 2 (two) times daily.  14 tablet  0    Allergies: Allergies  Allergen Reactions  . Azithromycin Itching  . Macrodantin Itching  . Minocycline Hcl Itching  . Nitrofurantoin Itching  . Zithromax (Azithromycin Dihydrate) Itching  . Contrast Media (Iodinated Diagnostic Agents) Rash    Social History:  The patient  reports that he quit smoking about 27 years ago. His smoking use included Cigarettes and Pipe. He has a 30 pack-year smoking history. He has never used smokeless tobacco. He reports that he does not drink alcohol or use illicit drugs.   ROS:  Please see the history of present illness.      All other systems reviewed and negative.   PHYSICAL EXAM: VS:  BP 122/54  Pulse  56  Ht 6\' 1"  (1.854 m)  Wt 183 lb 12.8 oz (83.371 kg)  BMI 24.25 kg/m2 Well nourished, well developed, in no acute distress HEENT: normal Neck: no JVD Cardiac:  normal S1, S2; RRR;  no murmur Lungs:  clear to auscultation bilaterally, no wheezing, rhonchi or rales Abd: soft, nontender, no hepatomegaly Ext: no edema Skin: warm and dry Neuro:  CNs 2-12 intact, no focal abnormalities noted  EKG:  Sinus bradycardia, HR 55, normal axis, septal Q waves, nonspecific ST-T wave changes, PR 196 ms     ASSESSMENT AND PLAN:  1. Chronic Deep Venous Thrombosis:  He has evidence of bilateral proximal DVT.  This is chronic and has been documented since 5/13.  Study done today shows non-obstructive thrombus, suggesting further resolution.  No indication for anticoagulation for chronic DVT.  Plus he is a poor candidate with ongoing need for ASA in setting of PCI to SVG in 01/2012 and chronic anemia + thrombocytopenia.  He does not have any evidence of chronic thrombotic syndrome.  I do not believe that he needs graduated compression stockings.  In addition, given his diabetic ulcers, stockings would likely be contraindicated.  Continue current management.   2. Coronary Artery Disease:   No angina.  Continue ASA and statin. 3. Hypertension:  Controlled.  Continue current therapy.  4. Hyperlipidemia:  Managed by PCP.  5. Carotid Stenosis:  Schedule follow up dopplers in 01/2013. 6. Mantle Cell Lymphoma:  Management per oncology. 7. Premature Atrial Contractions:  No evidence of AFib documented. 8. Aortic Stenosis:  Asymptomatic.  Consider follow up echo at next visit.  9. Hypomagnesemia:  Follow up Mg2+ level acceptable. 10. Disposition:  Follow up with Dr. Valera Castle in 6 mos.  Appointment in 01/2013 will be canceled.   Signed, Tereso Newcomer, PA-C  2:13 PM 12/21/2012

## 2012-12-21 NOTE — Patient Instructions (Addendum)
Your physician has requested that you have a carotid duplex DX 424.1; THIS CAN BE DONE SOMETIME AFTER 01/2013. This test is an ultrasound of the carotid arteries in your neck. It looks at blood flow through these arteries that supply the brain with blood. Allow one hour for this exam. There are no restrictions or special instructions.  Your physician wants you to follow-up in: 6 MONTH WITH DR. WALL. You will receive a reminder letter in the mail two months in advance. If you don't receive a letter, please call our office to schedule the follow-up appointment.

## 2013-01-09 ENCOUNTER — Ambulatory Visit: Payer: Medicare Other | Admitting: Cardiology

## 2013-01-23 ENCOUNTER — Other Ambulatory Visit: Payer: Self-pay | Admitting: Oncology

## 2013-01-23 DIAGNOSIS — C8313 Mantle cell lymphoma, intra-abdominal lymph nodes: Secondary | ICD-10-CM

## 2013-01-25 ENCOUNTER — Telehealth: Payer: Self-pay | Admitting: Oncology

## 2013-01-25 NOTE — Telephone Encounter (Signed)
Talked to pt's wife pt aware of appt appt on 2/25 advised pt to get appt calendar until Augusr since lab and chemo has been scheduled

## 2013-01-30 ENCOUNTER — Encounter: Payer: Self-pay | Admitting: *Deleted

## 2013-01-30 ENCOUNTER — Telehealth: Payer: Self-pay | Admitting: *Deleted

## 2013-01-30 ENCOUNTER — Ambulatory Visit (HOSPITAL_BASED_OUTPATIENT_CLINIC_OR_DEPARTMENT_OTHER): Payer: Medicare Other | Admitting: Oncology

## 2013-01-30 ENCOUNTER — Ambulatory Visit (HOSPITAL_BASED_OUTPATIENT_CLINIC_OR_DEPARTMENT_OTHER): Payer: Medicare Other

## 2013-01-30 ENCOUNTER — Encounter: Payer: Self-pay | Admitting: Oncology

## 2013-01-30 ENCOUNTER — Telehealth: Payer: Self-pay | Admitting: Oncology

## 2013-01-30 ENCOUNTER — Other Ambulatory Visit (HOSPITAL_BASED_OUTPATIENT_CLINIC_OR_DEPARTMENT_OTHER): Payer: Medicare Other | Admitting: Lab

## 2013-01-30 VITALS — BP 135/58 | HR 51 | Temp 97.0°F | Resp 18

## 2013-01-30 VITALS — BP 145/57 | HR 52 | Temp 96.9°F | Resp 20 | Ht 73.0 in | Wt 189.0 lb

## 2013-01-30 DIAGNOSIS — C8313 Mantle cell lymphoma, intra-abdominal lymph nodes: Secondary | ICD-10-CM

## 2013-01-30 LAB — COMPREHENSIVE METABOLIC PANEL (CC13)
Alkaline Phosphatase: 140 U/L (ref 40–150)
BUN: 27.5 mg/dL — ABNORMAL HIGH (ref 7.0–26.0)
CO2: 24 mEq/L (ref 22–29)
Creatinine: 1.1 mg/dL (ref 0.7–1.3)
Glucose: 97 mg/dl (ref 70–99)
Sodium: 139 mEq/L (ref 136–145)
Total Bilirubin: 0.58 mg/dL (ref 0.20–1.20)

## 2013-01-30 LAB — CBC WITH DIFFERENTIAL/PLATELET
BASO%: 0.5 % (ref 0.0–2.0)
Basophils Absolute: 0 10*3/uL (ref 0.0–0.1)
EOS%: 3.6 % (ref 0.0–7.0)
HCT: 33.4 % — ABNORMAL LOW (ref 38.4–49.9)
HGB: 11.2 g/dL — ABNORMAL LOW (ref 13.0–17.1)
MCH: 34.1 pg — ABNORMAL HIGH (ref 27.2–33.4)
MCHC: 33.5 g/dL (ref 32.0–36.0)
MCV: 101.8 fL — ABNORMAL HIGH (ref 79.3–98.0)
MONO%: 16.7 % — ABNORMAL HIGH (ref 0.0–14.0)
NEUT%: 59.7 % (ref 39.0–75.0)
lymph#: 0.7 10*3/uL — ABNORMAL LOW (ref 0.9–3.3)

## 2013-01-30 MED ORDER — SODIUM CHLORIDE 0.9 % IV SOLN
Freq: Once | INTRAVENOUS | Status: AC
  Administered 2013-01-30: 12:00:00 via INTRAVENOUS

## 2013-01-30 MED ORDER — ACETAMINOPHEN 325 MG PO TABS
650.0000 mg | ORAL_TABLET | Freq: Once | ORAL | Status: AC
  Administered 2013-01-30: 650 mg via ORAL

## 2013-01-30 MED ORDER — DIPHENHYDRAMINE HCL 25 MG PO CAPS
50.0000 mg | ORAL_CAPSULE | Freq: Once | ORAL | Status: AC
  Administered 2013-01-30: 50 mg via ORAL

## 2013-01-30 MED ORDER — RITUXIMAB CHEMO INJECTION 10 MG/ML
375.0000 mg/m2 | Freq: Once | INTRAVENOUS | Status: AC
  Administered 2013-01-30: 800 mg via INTRAVENOUS
  Filled 2013-01-30: qty 80

## 2013-01-30 NOTE — Patient Instructions (Addendum)
[facility] Discharge Instructions for Patients Receiving Chemotherary  Today you received the following chemotherapy agents rituxan  To help prevent nausea and vomiting after your treatment, we encourage you to take your nausea medication  and take it as often as prescribed   If you develop nausea and vomiting that is not controlled by your nausea medication, call the clinic. If it is after clinic hours your family physician or the after hours number for the clinic or go to the Emergency Department.   BELOW ARE SYMPTOMS THAT SHOULD BE REPORTED IMMEDIATELY:  *FEVER GREATER THAN 100.5 F  *CHILLS WITH OR WITHOUT FEVER  NAUSEA AND VOMITING THAT IS NOT CONTROLLED WITH YOUR NAUSEA MEDICATION  *UNUSUAL SHORTNESS OF BREATH  *UNUSUAL BRUISING OR BLEEDING  TENDERNESS IN MOUTH AND THROAT WITH OR WITHOUT PRESENCE OF ULCERS  *URINARY PROBLEMS  *BOWEL PROBLEMS  UNUSUAL RASH Items with * indicate a potential emergency and should be followed up as soon as possible.  One of the nurses will contact you 24 hours after your treatment. Please let the nurse know about any problems that you may have experienced. Feel free to call the clinic you have any questions or concerns. The clinic phone number is [phone].   I have been informed and understand all the instructions given to me. I know to contact the clinic, my physician, or go to the Emergency Department if any problems should occur. I do not have any questions at this time, but understand that I may call the clinic during office hours   should I have any questions or need assistance in obtaining follow up care.    __________________________________________  _____________  __________ Signature of Patient or Authorized Representative            Date                   Time    __________________________________________ Nurse's Signature

## 2013-01-30 NOTE — Patient Instructions (Addendum)
Rituxan treatments April 22, June 24, August 19 We will schedule a follow up CT scan for next week We will see you back in April

## 2013-01-30 NOTE — Telephone Encounter (Signed)
Per staff message and POF I have scheduled appts.  JMW  

## 2013-01-30 NOTE — Progress Notes (Signed)
@   1215, pt extremely difficult iv start.  Start x2 by 3 nurses (total 6 sticks) robin rn finally got patent iv.  Pt tolerated well.  dmr

## 2013-01-30 NOTE — Telephone Encounter (Signed)
gv and printed appt schedule for opt for March....emailed michelle to move tx closer to es

## 2013-01-30 NOTE — Progress Notes (Signed)
Hematology and Oncology Follow Up Visit  [name] [id number] June 05, 1927 79 y.o. 01/30/2013 10:58 AM   Principle Diagnosis: Encounter Diagnoses  Name Primary?  . Mantle cell lymphoma of intra-abdominal lymph nodes Yes  . Splenomegaly      Interim History:   Followup visit for this 79 year old man on active treatment for mantle cell lymphoma. He has clinical stage III disease. He declined a staging bone marrow biopsy. He presented with weight loss, splenomegaly and pancytopenia in March of 2013. He had a recent MI and coronary stent placement in February 2013. He never fully recovered from that procedure and his initial constitutional symptoms were felt to be related to his underlying cardiac disease. He had a remote coronary bypass surgery in 1995. CT scans showed splenomegaly with spleen measuring 18 cm, periportal, retroperitoneal, and iliac adenopathy. No disease in the chest. None of the lymph nodes were larger than 2 cm but some have metabolic uptake up to 6.3. At time of his first visit here he was very frail. I elected to treat him with single agent tree and a 70 mg per meter squared day 1 and day to every 28 days. Treatment was started on 04/17/2012. He got through 6 cycles given through 09/29/2012. and achieved a nice partial response. Performance status improved. He regained his lost weight. Spleen decreased from 18 to 16 cm.  He was started on consolidation with Rituxan anti-B cell antibody on December 4 with a plan to give 4 weekly doses and then every 2 month maintenance for 4 additional doses. He received the first of 3 planned weekly doses but then treatment was interrupted for other medical problems.  He is a diabetic. He developed an infected toe on his left foot which had to be amputated on 09/16/2012. Subsequent to that a second toe had to be amputated from the same foot on December 17. He is now fully recovered from those surgeries.  Last week he had a skin cancer  removed from his left ear.    Medications: reviewed  Allergies:  Allergies  Allergen Reactions  . Azithromycin Itching  . Macrodantin Itching  . Minocycline Hcl Itching  . Nitrofurantoin Itching  . Zithromax (Azithromycin Dihydrate) Itching  . Contrast Media (Iodinated Diagnostic Agents) Rash    Review of Systems: Constitutional:   Performance status now back to baseline Respiratory: Dyspnea on exertion not at rest Cardiovascular:  No chest pain or palpitations Gastrointestinal: No change in bowel habit Genito-Urinary: No urinary tract symptoms Musculoskeletal: No muscle or bone pain Neurologic: No headache or change in vision Skin: No rash or ecchymosis. Recent skin tumor removed left ear Remaining ROS negative.  Physical Exam: Blood pressure 145/57, pulse 52, temperature 96.9 F (36.1 C), temperature source Oral, resp. rate 20, height 6\' 1"  (1.854 m), weight 189 lb (85.73 kg). Wt Readings from Last 3 Encounters:  01/30/13 189 lb (85.73 kg)  12/21/12 183 lb 12.8 oz (83.371 kg)  11/20/12 178 lb 9.2 oz (81 kg)     General appearance: Thin Caucasian man. Weight 189 pounds increased compared with November when he weighed 182 HENNT: Pharynx no erythema or exudate Lymph nodes: No cervical, supraclavicular, or axillary adenopathy Breasts: Lungs: Coarse rhonchi in bilateral bases left greater than right Heart: Regular rhythm 3/6 systolic murmur left sternal border radiates to apex, 2/6 systolic murmur second right intercostal space Abdomen: Soft, nontender, I was unable to palpate his spleen today Extremities: No edema, no calf tenderness Vascular: No cyanosis Neurologic: Motor strength 5  over 5 reflexes 1+ symmetric Skin: Bandage over the left ear status post recent tumor excision  Lab Results: Lab Results  Component Value Date   WBC 3.7* 01/30/2013   HGB 11.2* 01/30/2013   HCT 33.4* 01/30/2013   MCV 101.8* 01/30/2013   PLT 99* 01/30/2013     Chemistry      Component  Value Date/Time   NA 137 11/20/2012 1948   NA 140 11/15/2012 0941   K 4.8 11/20/2012 1948   K 4.7 11/15/2012 0941   CL 102 11/20/2012 1948   CL 107 11/15/2012 0941   CO2 26 11/20/2012 1948   CO2 27 11/15/2012 0941   BUN 22 11/20/2012 1948   BUN 29.0* 11/15/2012 0941   CREATININE 1.09 11/20/2012 1948   CREATININE 1.2 11/15/2012 0941      Component Value Date/Time   CALCIUM 10.2 11/20/2012 1948   CALCIUM 9.7 11/15/2012 0941   ALKPHOS 153* 11/15/2012 0941   ALKPHOS 119* 09/19/2012 0450   AST 24 11/15/2012 0941   AST 12 09/19/2012 0450   ALT 24 11/15/2012 0941   ALT 8 09/19/2012 0450   BILITOT 0.73 11/15/2012 0941   BILITOT 0.3 09/19/2012 0450       Impression and Plan: #1. Clinical stage III mantle cell lymphoma in an elderly man.  He missed one weekly dose of Rituxan I am not going to make this up. I am going to start him on his every 2 months maintenance program today for 4 planned doses. Last month a very active new oral drug was approved for mantle cell lymphoma-Ibrutinib; we have also had approval within the last year of Revlimid for  relapsed mantle cell lymphoma. What I will likely do is put him on maintenance Revlimid when he completes the course of Rituxan and reserve the Ibrutinib for any subsequent relapse. I will get a CT scan at this time since he has not had one since November.  #2. Advanced coronary artery disease status post bypass surgery status post stent  #3. Obstructive airway disease  #4. Type 2 diabetes  #5. Peripheral vascular disease  #6. Postherpetic neuralgia on chronic gabapentin and prophylactic Zovirax  #7. Hyperlipidemia  #8. History of atrial flutter    CC:. Dr. Ivery Quale; Dr. Valera Castle   Levert Feinstein, MD 2/25/201410:58 AM

## 2013-01-31 ENCOUNTER — Other Ambulatory Visit: Payer: Self-pay | Admitting: Dermatology

## 2013-02-05 ENCOUNTER — Ambulatory Visit (HOSPITAL_COMMUNITY)
Admission: RE | Admit: 2013-02-05 | Discharge: 2013-02-05 | Disposition: A | Discharge status: Home / Self Care | Payer: Medicare Other | Source: Ambulatory Visit | Attending: Oncology | Admitting: Oncology

## 2013-02-05 DIAGNOSIS — M47817 Spondylosis without myelopathy or radiculopathy, lumbosacral region: Secondary | ICD-10-CM | POA: Insufficient documentation

## 2013-02-05 DIAGNOSIS — C8319 Mantle cell lymphoma, extranodal and solid organ sites: Secondary | ICD-10-CM | POA: Insufficient documentation

## 2013-02-05 DIAGNOSIS — R161 Splenomegaly, not elsewhere classified: Secondary | ICD-10-CM

## 2013-02-05 DIAGNOSIS — C8313 Mantle cell lymphoma, intra-abdominal lymph nodes: Secondary | ICD-10-CM

## 2013-02-05 DIAGNOSIS — R911 Solitary pulmonary nodule: Secondary | ICD-10-CM | POA: Insufficient documentation

## 2013-02-08 ENCOUNTER — Encounter (INDEPENDENT_AMBULATORY_CARE_PROVIDER_SITE_OTHER): Payer: Medicare Other

## 2013-02-14 ENCOUNTER — Telehealth: Payer: Self-pay | Admitting: *Deleted

## 2013-02-14 NOTE — Telephone Encounter (Signed)
Pt's wife notified of CT results per Dr Cyndie Chime.  She didn't have any questions.

## 2013-02-14 NOTE — Telephone Encounter (Signed)
Message copied by Sabino Snipes on Wed Feb 14, 2013  3:32 PM ------      Message from: [name]      Created: Fri Feb 09, 2013 12:03 PM       Call pt - spleen still slightly enlarged but a little smaller than last CT; no other areas of concern ------

## 2013-02-15 ENCOUNTER — Telehealth: Payer: Self-pay | Admitting: *Deleted

## 2013-02-15 ENCOUNTER — Encounter: Payer: Self-pay | Admitting: Physician Assistant

## 2013-02-15 NOTE — Telephone Encounter (Signed)
Message copied by Tarri Fuller on Thu Feb 15, 2013  3:24 PM ------      Message from: [place], [place] T      Created: Thu Feb 15, 2013  2:12 PM       40-59% LICA      0-39% RICA      Stable carotid disease      Repeat in one year.      Tereso Newcomer, PA-C  2:12 PM 02/15/2013        ------

## 2013-02-15 NOTE — Telephone Encounter (Signed)
pt's wife notified about carotid doppler results with verbal understanding

## 2013-02-20 ENCOUNTER — Other Ambulatory Visit: Payer: Self-pay | Admitting: Dermatology

## 2013-02-28 ENCOUNTER — Inpatient Hospital Stay (HOSPITAL_COMMUNITY)
Admission: EM | Admit: 2013-02-28 | Discharge: 2013-03-05 | DRG: 623 | Disposition: A | Discharge status: Skilled Nursing Facility | Payer: Medicare Other | Attending: Internal Medicine | Admitting: Internal Medicine

## 2013-02-28 ENCOUNTER — Encounter (HOSPITAL_COMMUNITY): Payer: Self-pay | Admitting: Emergency Medicine

## 2013-02-28 ENCOUNTER — Emergency Department (HOSPITAL_COMMUNITY): Payer: Medicare Other

## 2013-02-28 DIAGNOSIS — A4901 Methicillin susceptible Staphylococcus aureus infection, unspecified site: Secondary | ICD-10-CM | POA: Diagnosis present

## 2013-02-28 DIAGNOSIS — C8590 Non-Hodgkin lymphoma, unspecified, unspecified site: Secondary | ICD-10-CM

## 2013-02-28 DIAGNOSIS — I350 Nonrheumatic aortic (valve) stenosis: Secondary | ICD-10-CM

## 2013-02-28 DIAGNOSIS — J449 Chronic obstructive pulmonary disease, unspecified: Secondary | ICD-10-CM | POA: Diagnosis present

## 2013-02-28 DIAGNOSIS — N400 Enlarged prostate without lower urinary tract symptoms: Secondary | ICD-10-CM

## 2013-02-28 DIAGNOSIS — I2510 Atherosclerotic heart disease of native coronary artery without angina pectoris: Secondary | ICD-10-CM

## 2013-02-28 DIAGNOSIS — D61818 Other pancytopenia: Secondary | ICD-10-CM

## 2013-02-28 DIAGNOSIS — R079 Chest pain, unspecified: Secondary | ICD-10-CM

## 2013-02-28 DIAGNOSIS — C8319 Mantle cell lymphoma, extranodal and solid organ sites: Secondary | ICD-10-CM | POA: Diagnosis present

## 2013-02-28 DIAGNOSIS — Z794 Long term (current) use of insulin: Secondary | ICD-10-CM

## 2013-02-28 DIAGNOSIS — E785 Hyperlipidemia, unspecified: Secondary | ICD-10-CM

## 2013-02-28 DIAGNOSIS — L02619 Cutaneous abscess of unspecified foot: Secondary | ICD-10-CM | POA: Diagnosis present

## 2013-02-28 DIAGNOSIS — J4489 Other specified chronic obstructive pulmonary disease: Secondary | ICD-10-CM | POA: Diagnosis present

## 2013-02-28 DIAGNOSIS — I059 Rheumatic mitral valve disease, unspecified: Secondary | ICD-10-CM | POA: Diagnosis present

## 2013-02-28 DIAGNOSIS — E871 Hypo-osmolality and hyponatremia: Secondary | ICD-10-CM | POA: Diagnosis present

## 2013-02-28 DIAGNOSIS — I679 Cerebrovascular disease, unspecified: Secondary | ICD-10-CM

## 2013-02-28 DIAGNOSIS — L98499 Non-pressure chronic ulcer of skin of other sites with unspecified severity: Secondary | ICD-10-CM | POA: Diagnosis present

## 2013-02-28 DIAGNOSIS — C8310 Mantle cell lymphoma, unspecified site: Secondary | ICD-10-CM | POA: Diagnosis present

## 2013-02-28 DIAGNOSIS — R509 Fever, unspecified: Secondary | ICD-10-CM

## 2013-02-28 DIAGNOSIS — E119 Type 2 diabetes mellitus without complications: Secondary | ICD-10-CM

## 2013-02-28 DIAGNOSIS — R7881 Bacteremia: Secondary | ICD-10-CM

## 2013-02-28 DIAGNOSIS — A419 Sepsis, unspecified organism: Secondary | ICD-10-CM

## 2013-02-28 DIAGNOSIS — L03119 Cellulitis of unspecified part of limb: Secondary | ICD-10-CM

## 2013-02-28 DIAGNOSIS — J984 Other disorders of lung: Secondary | ICD-10-CM

## 2013-02-28 DIAGNOSIS — R161 Splenomegaly, not elsewhere classified: Secondary | ICD-10-CM

## 2013-02-28 DIAGNOSIS — L03116 Cellulitis of left lower limb: Secondary | ICD-10-CM

## 2013-02-28 DIAGNOSIS — B9561 Methicillin susceptible Staphylococcus aureus infection as the cause of diseases classified elsewhere: Secondary | ICD-10-CM

## 2013-02-28 DIAGNOSIS — Z9289 Personal history of other medical treatment: Secondary | ICD-10-CM

## 2013-02-28 DIAGNOSIS — L97509 Non-pressure chronic ulcer of other part of unspecified foot with unspecified severity: Secondary | ICD-10-CM | POA: Diagnosis present

## 2013-02-28 DIAGNOSIS — I96 Gangrene, not elsewhere classified: Secondary | ICD-10-CM

## 2013-02-28 DIAGNOSIS — L0390 Cellulitis, unspecified: Secondary | ICD-10-CM

## 2013-02-28 DIAGNOSIS — Z87891 Personal history of nicotine dependence: Secondary | ICD-10-CM

## 2013-02-28 DIAGNOSIS — Z9221 Personal history of antineoplastic chemotherapy: Secondary | ICD-10-CM

## 2013-02-28 DIAGNOSIS — M869 Osteomyelitis, unspecified: Secondary | ICD-10-CM | POA: Diagnosis present

## 2013-02-28 DIAGNOSIS — B0229 Other postherpetic nervous system involvement: Secondary | ICD-10-CM | POA: Diagnosis present

## 2013-02-28 DIAGNOSIS — I779 Disorder of arteries and arterioles, unspecified: Secondary | ICD-10-CM

## 2013-02-28 DIAGNOSIS — D649 Anemia, unspecified: Secondary | ICD-10-CM

## 2013-02-28 DIAGNOSIS — Z951 Presence of aortocoronary bypass graft: Secondary | ICD-10-CM

## 2013-02-28 DIAGNOSIS — R112 Nausea with vomiting, unspecified: Secondary | ICD-10-CM

## 2013-02-28 DIAGNOSIS — S98139A Complete traumatic amputation of one unspecified lesser toe, initial encounter: Secondary | ICD-10-CM

## 2013-02-28 DIAGNOSIS — Z79899 Other long term (current) drug therapy: Secondary | ICD-10-CM

## 2013-02-28 DIAGNOSIS — C8313 Mantle cell lymphoma, intra-abdominal lymph nodes: Secondary | ICD-10-CM

## 2013-02-28 DIAGNOSIS — L309 Dermatitis, unspecified: Secondary | ICD-10-CM

## 2013-02-28 DIAGNOSIS — R0989 Other specified symptoms and signs involving the circulatory and respiratory systems: Secondary | ICD-10-CM

## 2013-02-28 DIAGNOSIS — I10 Essential (primary) hypertension: Secondary | ICD-10-CM

## 2013-02-28 DIAGNOSIS — R9431 Abnormal electrocardiogram [ECG] [EKG]: Secondary | ICD-10-CM

## 2013-02-28 DIAGNOSIS — E11621 Type 2 diabetes mellitus with foot ulcer: Secondary | ICD-10-CM

## 2013-02-28 DIAGNOSIS — I6529 Occlusion and stenosis of unspecified carotid artery: Secondary | ICD-10-CM | POA: Diagnosis present

## 2013-02-28 DIAGNOSIS — E1169 Type 2 diabetes mellitus with other specified complication: Principal | ICD-10-CM | POA: Diagnosis present

## 2013-02-28 DIAGNOSIS — I739 Peripheral vascular disease, unspecified: Secondary | ICD-10-CM

## 2013-02-28 DIAGNOSIS — I825Y9 Chronic embolism and thrombosis of unspecified deep veins of unspecified proximal lower extremity: Secondary | ICD-10-CM | POA: Diagnosis present

## 2013-02-28 DIAGNOSIS — E1149 Type 2 diabetes mellitus with other diabetic neurological complication: Secondary | ICD-10-CM

## 2013-02-28 DIAGNOSIS — D539 Nutritional anemia, unspecified: Secondary | ICD-10-CM

## 2013-02-28 DIAGNOSIS — J189 Pneumonia, unspecified organism: Secondary | ICD-10-CM

## 2013-02-28 DIAGNOSIS — M792 Neuralgia and neuritis, unspecified: Secondary | ICD-10-CM

## 2013-02-28 DIAGNOSIS — R600 Localized edema: Secondary | ICD-10-CM

## 2013-02-28 DIAGNOSIS — I359 Nonrheumatic aortic valve disorder, unspecified: Secondary | ICD-10-CM | POA: Diagnosis present

## 2013-02-28 DIAGNOSIS — L0291 Cutaneous abscess, unspecified: Secondary | ICD-10-CM | POA: Diagnosis present

## 2013-02-28 DIAGNOSIS — I252 Old myocardial infarction: Secondary | ICD-10-CM

## 2013-02-28 DIAGNOSIS — D696 Thrombocytopenia, unspecified: Secondary | ICD-10-CM

## 2013-02-28 LAB — COMPREHENSIVE METABOLIC PANEL
AST: 20 U/L (ref 0–37)
Albumin: 3.6 g/dL (ref 3.5–5.2)
Alkaline Phosphatase: 129 U/L — ABNORMAL HIGH (ref 39–117)
BUN: 22 mg/dL (ref 6–23)
Chloride: 92 mEq/L — ABNORMAL LOW (ref 96–112)
Potassium: 3.8 mEq/L (ref 3.5–5.1)
Sodium: 127 mEq/L — ABNORMAL LOW (ref 135–145)
Total Bilirubin: 1.1 mg/dL (ref 0.3–1.2)
Total Protein: 5.9 g/dL — ABNORMAL LOW (ref 6.0–8.3)

## 2013-02-28 LAB — CBC WITH DIFFERENTIAL/PLATELET
Band Neutrophils: 0 % (ref 0–10)
Blasts: 0 %
HCT: 31.2 % — ABNORMAL LOW (ref 39.0–52.0)
MCHC: 34.9 g/dL (ref 30.0–36.0)
MCV: 99.7 fL (ref 78.0–100.0)
Metamyelocytes Relative: 0 %
Monocytes Absolute: 0.9 10*3/uL (ref 0.1–1.0)
Monocytes Relative: 23 % — ABNORMAL HIGH (ref 3–12)
Myelocytes: 0 %
Platelets: 79 10*3/uL — ABNORMAL LOW (ref 150–400)
Promyelocytes Absolute: 0 %
RDW: 14.3 % (ref 11.5–15.5)
WBC: 4.1 10*3/uL (ref 4.0–10.5)
nRBC: 0 /100 WBC

## 2013-02-28 LAB — URINALYSIS, ROUTINE W REFLEX MICROSCOPIC
Glucose, UA: NEGATIVE mg/dL
Ketones, ur: NEGATIVE mg/dL
Leukocytes, UA: NEGATIVE
pH: 6 (ref 5.0–8.0)

## 2013-02-28 LAB — URINE MICROSCOPIC-ADD ON

## 2013-02-28 LAB — GLUCOSE, CAPILLARY: Glucose-Capillary: 161 mg/dL — ABNORMAL HIGH (ref 70–99)

## 2013-02-28 MED ORDER — ACETAMINOPHEN 500 MG PO TABS
1000.0000 mg | ORAL_TABLET | Freq: Once | ORAL | Status: AC
  Administered 2013-02-28: 1000 mg via ORAL
  Filled 2013-02-28: qty 2

## 2013-02-28 MED ORDER — INSULIN ASPART 100 UNIT/ML ~~LOC~~ SOLN: 0.0000 [IU] | Freq: Every day | SUBCUTANEOUS | Status: DC

## 2013-02-28 MED ORDER — ENOXAPARIN SODIUM 40 MG/0.4ML ~~LOC~~ SOLN
40.0000 mg | SUBCUTANEOUS | Status: DC
  Administered 2013-02-28: 40 mg via SUBCUTANEOUS
  Filled 2013-02-28 (×3): qty 0.4

## 2013-02-28 MED ORDER — PIPERACILLIN-TAZOBACTAM 3.375 G IVPB
3.3750 g | Freq: Three times a day (TID) | INTRAVENOUS | Status: DC
  Administered 2013-03-01 – 2013-03-03 (×8): 3.375 g via INTRAVENOUS
  Filled 2013-02-28 (×9): qty 50

## 2013-02-28 MED ORDER — VANCOMYCIN HCL IN DEXTROSE 1-5 GM/200ML-% IV SOLN
1000.0000 mg | Freq: Two times a day (BID) | INTRAVENOUS | Status: DC
  Administered 2013-03-01 – 2013-03-03 (×5): 1000 mg via INTRAVENOUS
  Filled 2013-02-28 (×6): qty 200

## 2013-02-28 MED ORDER — INSULIN GLARGINE 100 UNIT/ML ~~LOC~~ SOLN
10.0000 [IU] | Freq: Every day | SUBCUTANEOUS | Status: DC
  Administered 2013-02-28 – 2013-03-04 (×5): 10 [IU] via SUBCUTANEOUS
  Filled 2013-02-28 (×8): qty 0.1

## 2013-02-28 MED ORDER — INSULIN ASPART 100 UNIT/ML ~~LOC~~ SOLN
0.0000 [IU] | Freq: Three times a day (TID) | SUBCUTANEOUS | Status: DC
  Administered 2013-03-01 – 2013-03-02 (×2): 2 [IU] via SUBCUTANEOUS
  Administered 2013-03-03: 4 [IU] via SUBCUTANEOUS
  Administered 2013-03-04 – 2013-03-05 (×3): 2 [IU] via SUBCUTANEOUS

## 2013-02-28 MED ORDER — ACETAMINOPHEN 325 MG PO TABS
650.0000 mg | ORAL_TABLET | Freq: Four times a day (QID) | ORAL | Status: DC | PRN
  Administered 2013-03-01 – 2013-03-02 (×4): 650 mg via ORAL
  Filled 2013-02-28 (×4): qty 2

## 2013-02-28 MED ORDER — POTASSIUM CHLORIDE 20 MEQ/15ML (10%) PO LIQD
20.0000 meq | Freq: Every day | ORAL | Status: DC
  Administered 2013-02-28 – 2013-03-05 (×6): 20 meq via ORAL
  Filled 2013-02-28 (×7): qty 15

## 2013-02-28 MED ORDER — VANCOMYCIN HCL IN DEXTROSE 1-5 GM/200ML-% IV SOLN
1000.0000 mg | Freq: Once | INTRAVENOUS | Status: AC
  Administered 2013-02-28: 1000 mg via INTRAVENOUS
  Filled 2013-02-28: qty 200

## 2013-02-28 MED ORDER — PIPERACILLIN-TAZOBACTAM 3.375 G IVPB
3.3750 g | Freq: Once | INTRAVENOUS | Status: AC
  Administered 2013-02-28: 3.375 g via INTRAVENOUS
  Filled 2013-02-28: qty 50

## 2013-02-28 MED ORDER — ONDANSETRON HCL 4 MG/2ML IJ SOLN
4.0000 mg | Freq: Once | INTRAMUSCULAR | Status: AC
  Administered 2013-02-28: 4 mg via INTRAVENOUS
  Filled 2013-02-28: qty 2

## 2013-02-28 MED ORDER — SODIUM CHLORIDE 0.9 % IV SOLN
1000.0000 mL | Freq: Once | INTRAVENOUS | Status: AC
  Administered 2013-02-28: 1000 mL via INTRAVENOUS

## 2013-02-28 MED ORDER — SODIUM CHLORIDE 0.9 % IV SOLN
1000.0000 mL | INTRAVENOUS | Status: DC
  Administered 2013-02-28 – 2013-03-01 (×4): 1000 mL via INTRAVENOUS
  Administered 2013-03-01: 16:00:00 via INTRAVENOUS
  Administered 2013-03-01 – 2013-03-02 (×2): 1000 mL via INTRAVENOUS

## 2013-02-28 NOTE — H&P (Signed)
PCP:   Garlan Fillers, MD   Chief Complaint:  Foot infection and fever HPI: Had been doing well until an episode of vomiting with chills. Wife thought he had the flu but when she removed his sock, a new issue with bloody drainage from the 1st MTP area on the left was noted. He was seen by Dr Victorino Dike in his office and a large amount of bloody pus was expressed from the wound. He was sent here for admissions. He is febrile with chills and sweats. He is still a bit nauseous but has not vomited any more. He had a normal formed BM earlier today. He feels weak. He denies CP or SOB. He has no abdominal pain. His foot does not hurt. FBS today was 180 and has been 120 and 150 in recent days. He is still getting monthly chemo for his mantle cell lymphoma. He was hospitalized 7 times in the past year and had 2 foot procedures with toe amputations. At last visit in our office in FEB he had well controlled DM and was otherwise doing well. He has PVD and insensate feet.  Review of Systems:  Review of Systems -  As above Past Medical History: Past Medical History  Diagnosis Date  . CAD (coronary artery disease)     a. s/p CABG 1995;  b. NSTEMI & subsequent BMS to SVG->right PDA 02/03/12.;  c. Cath 02/14/2012 3vd with 4/4 patent grafts and patent stent in vg->pda.  . Carotid stenosis     a. Carotid dopplers 2/13: RICA 40-59%, LICA 0-39%;  b. dopplers 3/14:  40-59% RICA, 0-39% LICA  . HTN (hypertension)   . HLD (hyperlipidemia)   . DM2 (diabetes mellitus, type 2)   . DJD (degenerative joint disease)   . Asthmatic bronchitis   . BPH (benign prostatic hypertrophy)   . Herpes zoster   . Anemia   . Thrombocytopenia     a. secondary to splenomegaly related to lymphoma with suspicion of bone marrow involvement. (Plavix had to be stopped due to this)  . Aortic stenosis, moderate     a. Echo 02/14/12 EF 55%, Gr 2 DD, Mod AS (mean grad: 29, peak grad 52)  . Splenomegaly 04/13/2012  . History of blood transfusion  04/13/2012  . 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 Dermatitis 06/07/2012    acnieform rash on back 06/06/12  . COPD (chronic obstructive pulmonary disease)   . Peripheral vascular disease     a. s/p L ray amp 10/13;  b. s/p L 2nd toe amp 12/13  . Pulmonary nodule     8mm RUL calcified pulm nodule noted on CT staging for lymphoma - stable 10/2012.  . Leg DVT (deep venous thromboembolism), chronic     LE dopplers 5/13, 10/13 and 1/14: chronic DVT involving right mid femoral vein, left mid femoral vein, and left popliteal vein.  . Osteomyelitis     L first foot ray amputation 09/2012  . Gangrene     L 2nd toe amputation 11/2012      Past Surgical History  Procedure Laterality Date  . Coronary artery bypass graft  1995  . Rotator cuff repair    . Carpal tunnel release    . Incisional hernia repair    . Kidney surgery    . Back surgery    . I&d extremity  09/16/2012    Procedure: IRRIGATION AND DEBRIDEMENT EXTREMITY;  Surgeon: [name],  MD;  Location: [facility];  Service: Orthopedics;  Laterality: Left;  irrigation and debridement and first Ray amputation of left foot  . Amputation  09/16/2012    Procedure: AMPUTATION RAY;  Surgeon: [name], MD;  Location: [facility];  Service: Orthopedics;  Laterality: Left;  first Ray amputation left foot  . Amputation  11/21/2012    Procedure: AMPUTATION RAY;  Surgeon: [name], MD;  Location: [facility] OR;  Service: Orthopedics;  Laterality: Left;  LEFT SECOND TOE AMPUTATION  Past History Surgical History: Nephrolithiasis (1970, 1980) L-spine (1988) CABG x 5 (1995) Hernia, incisional (2004) Right and left shoulder rotator cuff repair (2005) Bilateral carpal tunnel (2005) Left cataract (2008) October 2006 colonoscopy showed diverticulosis and colon polyps (adenoma) 2012 Moh's surgery for forehead Basal Cell Carcinoma 2/13 Cardiac Cath and PTCA PDA 12/13 Amputation of left great  toe and 2nd toe 2/14 Basal cell CA resection from left chin region  Medications: Prior to Admission medications   Medication Sig Start Date End Date Taking? Authorizing Provider  acyclovir (ZOVIRAX) 400 MG tablet Take 400 mg by mouth 2 (two) times daily.   Yes Historical Provider, MD  aspirin EC 81 MG tablet Take 81 mg by mouth every morning.    Yes Historical Provider, MD  gabapentin (NEURONTIN) 300 MG capsule Take 300-600 mg by mouth 3 (three) times daily. TAKES 2 CAPSULES IN THE MORNING AND 2 AT LUNCH AND 1 AT NIGHT   Yes Historical Provider, MD  isosorbide mononitrate (IMDUR) 30 MG 24 hr tablet Take 1 tablet (30 mg total) by mouth daily. 05/24/12 05/24/13 Yes Gaylord Shih, MD  losartan (COZAAR) 100 MG tablet Take 1 tablet by mouth daily.  10/17/11  Yes Historical Provider, MD  meloxicam (MOBIC) 15 MG tablet Take 15 mg by mouth daily.   Yes Historical Provider, MD  metFORMIN (GLUCOPHAGE) 500 MG tablet Take 1,000 mg by mouth daily.   Yes Historical Provider, MD  metoprolol succinate (TOPROL-XL) 25 MG 24 hr tablet Take 25 mg by mouth daily.   Yes Historical Provider, MD  ondansetron (ZOFRAN-ODT) 8 MG disintegrating tablet Place 8 mg under the tongue every 8 (eight) hours as needed. For nausea. 04/13/12  Yes Historical Provider, MD  pravastatin (PRAVACHOL) 40 MG tablet Take 40 mg by mouth daily.   Yes Historical Provider, MD  vitamin B-12 (CYANOCOBALAMIN) 500 MCG tablet Take 500 mcg by mouth daily.   Yes Historical Provider, MD    Allergies:   Allergies  Allergen Reactions  . Azithromycin Itching  . Macrodantin Itching  . Minocycline Hcl Itching  . Nitrofurantoin Itching  . Zithromax (Azithromycin Dihydrate) Itching  . Contrast Media (Iodinated Diagnostic Agents) Rash    Social History:  reports that he quit smoking about 27 years ago. His smoking use included Cigarettes and Pipe. He has a 30 pack-year smoking history. He has never used smokeless tobacco. He reports that he does not  drink alcohol or use illicit drugs.   Social History (reviewed - no changes required): He is marrried to [place] who is also a patient.  He is retired from [employer].  He has two daughters and three grandchildren who all live in [place].  He smoked years ago and does not drink alcohol.  Family History: Family History  Problem Relation Age of Onset  . Coronary artery disease    . Alcohol abuse     Family History (reviewed - no changes required): Father and mother are both deceased.  History positive for early CAD.  He has five living sisters.  His brother is deceased history positive for MI at a young age (3 years)  Physical Exam: Filed Vitals:   02/28/13 1642 02/28/13 1810  BP: 145/65   Pulse: 87   Temp: 99.3 F (37.4 C) 101.2 F (38.4 C)  TempSrc: Oral Rectal  Resp: 25   SpO2: 94%    General appearance: mild distress, a bit flushed, sclera anicteric, oral membranes moist, no yeast   Neck: supple with left bruit Resp: clear to auscultation bilaterally with no wheezes, rales or rhonchi Cardio: regular rate and rhythm with frequent premature beats and sys murmur GI: soft, non-tender; bowel sounds normal; no masses,   Hypoactive BS's, cant feel spleen Extremities:  Healed toe amputation on left, 2 mm hole over distal MTP plantar area with expressable pus. Diminished pulses, mid foot and forefoot are red , warm and swollen Lymph nodes: Cervical adenopathy: no cervical lymphadenopathy Neurologic: awake, a little confused, weak, some tremor, clear speech  Labs on Admission:   Recent Labs  02/28/13 1725  NA 127*  K 3.8  CL 92*  CO2 21  GLUCOSE 151*  BUN 22  CREATININE 0.99  CALCIUM 9.2    Recent Labs  02/28/13 1725  AST 20  ALT 12  ALKPHOS 129*  BILITOT 1.1  PROT 5.9*  ALBUMIN 3.6       Recent Labs  02/28/13 1725  WBC 4.1  NEUTROABS 2.7  HGB 10.9*  HCT 31.2*  MCV 99.7  PLT 79*     Notes Recorded by Beatrice Lecher, PA-C on 02/15/2013 at 2:12  PM 40-59% LICA 0-39% RICA Stable carotid disease Repeat in one year.    Radiological Exams on Admission: Ct Abdomen Pelvis Wo Contrast    Ct Chest Wo Contrast  02/05/2013  *RADIOLOGY REPORT*  Clinical Data:  Follow up mantle cell lymphoma  CT CHEST, ABDOMEN AND PELVIS WITHOUT CONTRAST  Technique:  Multidetector CT imaging of the chest, abdomen and pelvis was performed following the standard protocol without IV contrast.  Comparison:  02/05/2013  CT CHEST  Findings:  Lungs/pleura: There is no pleural effusion identified. Mild basilar predominant interstitial reticulation is identified. 7 mm nodule in the superior segment of right lower lobe is stable, image 30/series 4.  Within the right apex there is an 8 mm irregular density, image 13/series 4. This is unchanged from previous exam.  Pulmonary nodule within the left lower lobe is unchanged measuring 4 mm, image 32.  This is stable from previous exam.  Heart/Mediastinum: Normal heart size.  No pericardial effusion. Status post CABG.  There is no mediastinal or hilar adenopathy.  No pericardial or pleural effusion.  Bones/Musculoskeletal:  No axillary or supraclavicular adenopathy noted. There are no aggressive lytic or sclerotic bone lesions identified.  IMPRESSION:  1.  No acute cardiopulmonary abnormalities. 2.  No mass or adenopathy. 3.  Small pulmonary nodules are unchanged from previous exam.  CT ABDOMEN AND PELVIS  Findings:  Calcified granuloma noted within the dome of liver.  No focal liver abnormalities identified.  The gallbladder appears normal.  No biliary dilatation.  The pancreas is unremarkable.  The spleen measures 15 cm in craniocaudal dimension.  This is compared with 16 cm previously.  No focal splenic lesion identified. Normal appearance of the adrenal glands.  Small nonobstructing calculus is noted in the inferior pole the right kidney measuring approximately 2 mm.  Nonobstructing stone within the inferior pole of the left kidney  measures 6 mm, image  64/series 2.  The urinary bladder is within normal limits.  Prostate gland and seminal vesicles are unremarkable.  Normal caliber of the abdominal aorta. Gastrohepatic ligament lymph node measures 1 cm, image 66/series 2.  Previously 1.1 cm.  No pelvic or inguinal adenopathy. The stomach is normal.  Small bowel loops are unremarkable.  The appendix is visualized and appears normal.  Normal caliber of the colon.  Peritoneal nodule within the upper abdomen measures 2.30 cm, image 60/series 2.  Previously this measured 1.9 cm  Review of the visualized osseous structures is negative for lytic or sclerotic bone change.  There is multilevel spondylosis identified within the lumbar spine.  IMPRESSION:  1.  No acute findings. 2.  Slight increase in size of the peritoneal nodule within the upper abdomen. Stable gastrohepatic ligament lymph node.  3.  Stable splenomegaly.   Original Report Authenticated By: Signa Kell, M.D.    Dg Chest Portable 1 View  02/28/2013  *RADIOLOGY REPORT*  Clinical Data: Chest pain and cough.  Vomiting.  PORTABLE CHEST - 1 VIEW  Comparison: PA and lateral chest 09/07/2012 and CT chest 02/05/2013.  Findings: The patient is status post CABG.  Heart size is mildly enlarged.  Lungs are clear.  No pneumothorax or pleural fluid. Postoperative change left shoulder is noted.  IMPRESSION: No acute abnormality.    ECHO 2013- Left ventricle: The cavity size was normal. Wall thickness was increased in a pattern of mild LVH. The estimated ejection fraction was 55%. Wall motion was normal; there were no regional wall motion abnormalities. Features are consistent with a pseudonormal left ventricular filling pattern, with concomitant abnormal relaxation and increased filling pressure (grade 2 diastolic dysfunction). Doppler parameters are consistent with high ventricular filling pressure. - Aortic valve: There was moderate stenosis. Mean gradient:66mm Hg (S). Peak gradient:  52mm Hg (S). - Mitral valve: Moderately calcified annulus. - Left atrium: The atrium was mildly dilated. - Right ventricle: The cavity size was mildly dilated. - Right atrium: The atrium was mildly dilated.       Orders placed in visit on 12/21/12  . EKG 12-LEAD    Assessment/Plan Principal Problem:   Diabetic foot ulcer associated with type 2 diabetes mellitus: there is clearly a deep seated infection with ascending cellulitis and almost certainly osteomyelitis. Suspect Blood cultures could be positive. Pt is clinically toxic but not septic. He is immunocompromised with vascular compromise and insensate feet. No gangrene noted. Suspect a more extensive surgical procedure will be needed. On zosyn and vanc. WBC low due to chemo. Active Problems:   Anemia: due to chemo and CA   Peripheral arterial disease: I don't see any recent ABI's but I'm not sure they would change mgmt tonight   Fever and chills: clinically toxic from foot infection   Type II or unspecified type diabetes mellitus with neurological manifestations, not stated as uncontrolled(250.60): hold MET, add a little lantus and SS insulin   Mantle cell lymphoma: getting Chemo CT as above   Carotid artery disease: see report from 2 weeks ago   Essential hypertension, benign: BP reasonable   Aortic stenosis, moderate: see ECHO above from a yr ago   CAD (coronary artery disease): doing well. Some ectopy noted . Will check EKG but doesn't look like Afib on the monitor Hyponatremia: likely due to n/v, illness. Gentle NS hydration Thrombocytopenia: stable, no overt bleeding Chronic LE DVT: Rx lovenox now    Berkley Wrightsman ALAN 02/28/2013, 6:46 PM

## 2013-02-28 NOTE — ED Notes (Signed)
Pt complains of weakness and generalized body aches.  Denies cough, sore throat or diarrhea.  Pt states he is able to keep down fluids, but not solids.  Denies throwing up today, but threw up 3x yesterday.

## 2013-02-28 NOTE — ED Notes (Addendum)
Pt from [facility].  Pt there for check up on L foot toes that were amputated last fall.  Pt complained to MD that he had n/v and flu-like sx for past 3 days. Pt's foot also bled after examination.  Pt also told EMS that he also has been running a fever of 101F for past 2 days.

## 2013-02-28 NOTE — Progress Notes (Signed)
ANTIBIOTIC CONSULT NOTE - INITIAL  Pharmacy Consult for Vancomycin, Zosyn Indication: Diabetic foot ulcer  Allergies  Allergen Reactions  . Azithromycin Itching  . Macrodantin Itching  . Minocycline Hcl Itching  . Nitrofurantoin Itching  . Zithromax (Azithromycin Dihydrate) Itching  . Contrast Media (Iodinated Diagnostic Agents) Rash    Patient Measurements:   Adjusted Body Weight:   Vital Signs: Temp: 101.2 F (38.4 C) (03/26 1810) Temp src: Rectal (03/26 1810) BP: 145/65 mmHg (03/26 1642) Pulse Rate: 87 (03/26 1642)  Labs:  Recent Labs  02/28/13 1725  WBC 4.1  HGB 10.9*  PLT 79*  CREATININE 0.99   The CrCl is unknown because both a height and weight (above a minimum accepted value) are required for this calculation. CrCl ~ 68 ml/min CG CrCl ~ 57 ml/min Normalized  Microbiology: No results found for this or any previous visit (from the past 720 hour(s)).  Medical History: Past Medical History  Diagnosis Date  . CAD (coronary artery disease)     a. s/p CABG 1995;  b. NSTEMI & subsequent BMS to SVG->right PDA 02/03/12.;  c. Cath 02/14/2012 3vd with 4/4 patent grafts and patent stent in vg->pda.  . Carotid stenosis     a. Carotid dopplers 2/13: RICA 40-59%, LICA 0-39%;  b. dopplers 3/14:  40-59% RICA, 0-39% LICA  . HTN (hypertension)   . HLD (hyperlipidemia)   . DM2 (diabetes mellitus, type 2)   . DJD (degenerative joint disease)   . Asthmatic bronchitis   . BPH (benign prostatic hypertrophy)   . Herpes zoster   . Anemia   . Thrombocytopenia     a. secondary to splenomegaly related to lymphoma with suspicion of bone marrow involvement. (Plavix had to be stopped due to this)  . Aortic stenosis, moderate     a. Echo 02/14/12 EF 55%, Gr 2 DD, Mod AS (mean grad: 29, peak grad 52)  . Splenomegaly 04/13/2012  . History of blood transfusion 04/13/2012  . 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  Dermatitis 06/07/2012    acnieform rash on back 06/06/12  . COPD (chronic obstructive pulmonary disease)   . Peripheral vascular disease     a. s/p L ray amp 10/13;  b. s/p L 2nd toe amp 12/13  . Pulmonary nodule     8mm RUL calcified pulm nodule noted on CT staging for lymphoma - stable 10/2012.  . Leg DVT (deep venous thromboembolism), chronic     LE dopplers 5/13, 10/13 and 1/14: chronic DVT involving right mid femoral vein, left mid femoral vein, and left popliteal vein.  . Osteomyelitis     L first foot ray amputation 09/2012  . Gangrene     L 2nd toe amputation 11/2012    Medications:  Anti-infectives   Start     Dose/Rate Route Frequency Ordered Stop   02/28/13 1830  vancomycin (VANCOCIN) IVPB 1000 mg/200 mL premix     1,000 mg 200 mL/hr over 60 Minutes Intravenous  Once 02/28/13 1824     02/28/13 1830  piperacillin-tazobactam (ZOSYN) IVPB 3.375 g     3.375 g 100 mL/hr over 30 Minutes Intravenous  Once 02/28/13 1824       Assessment: 85 yom present to ED 3/26 with N/V and flu-like symptoms x3 days.  He has a diabetic foot ulcer and a history of amputation of left second toe d/t gangrene 11/2012 and NHL with last chemotherapy on 01/30/13.  Pharmacy asked to assist with Vancomycin and Zosyn dosing for foot infection.  SCr 0.99, CrCl ~ 68 ml/min (CG)  WBC 4.1, Tm 101.2  First doses of antibiotics ordered in ED.  Goal of Therapy:  Vancomycin trough level 15-20 mcg/ml  Plan:   Zosyn 3.375g IV Q8H infused over 4hrs.  Vancomycin 1g IV x1 IV q12h.  Measure Vanc trough at steady state.  Follow up renal fxn and culture results.   Lynann Beaver PharmD, BCPS Pager [phone] 02/28/2013 7:16 PM

## 2013-02-28 NOTE — ED Notes (Signed)
[id number]<JY> Expected date:<BR> Expected time:<BR> Means of arrival:<BR> Comments:<BR> n/v

## 2013-02-28 NOTE — ED Provider Notes (Signed)
History     CSN: [id number]  Arrival date & time 02/28/13  1632   First MD Initiated Contact with Patient 02/28/13 1700      Chief Complaint  Patient presents with  . Emesis   Level V caveat for altered mental status  (Consider location/radiation/quality/duration/timing/severity/associated sxs/prior treatment) HPI  Per wife patient was fine yesterday morning when he returned home from his usual morning at [facility]. She reports shortly afterward he started having chills and he developed a fever to 101. He had nausea with 2 episodes of vomiting yesterday. She reports today his temperature was 100.5 and he vomited once. She states when he vomited today he did vomit on his foot and when she removed his sock she noted that he had increased redness and swelling in his foot that was not present there earlier in the morning when she had dressed him. She called his orthopedist Dr. Victorino Dike and they were seen in the office today. She reports when he touched the foot there was a large amount of bloody drainage on the sole aspect of the foot. She reports that patient was so weak he could not ambulate to the car to come to the ED so EMS was called. She states she's had a mild cough. He denies sore throat, rhinorrhea or diarrhea. When patient asked how he is feeling he just states "real bad". He can only clarify and say it is "the flu". Wife says he has not eaten today but he has been able to drink fluids. Patient had his left great toe and second toe amputated last fall.  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 Cyndie Chime. She thinks his last chemotherapy was in January or February.   PCP Dr [name] Orthopedist Dr Victorino Dike  Past Medical History  Diagnosis Date  . CAD (coronary artery disease)     a. s/p CABG 1995;  b. NSTEMI & subsequent BMS to SVG->right PDA 02/03/12.;  c. Cath 02/14/2012 3vd with 4/4 patent grafts and patent  stent in vg->pda.  . Carotid stenosis     a. Carotid dopplers 2/13: RICA 40-59%, LICA 0-39%;  b. dopplers 3/14:  40-59% RICA, 0-39% LICA  . HTN (hypertension)   . HLD (hyperlipidemia)   . DM2 (diabetes mellitus, type 2)   . DJD (degenerative joint disease)   . Asthmatic bronchitis   . BPH (benign prostatic hypertrophy)   . Herpes zoster   . Anemia   . Thrombocytopenia     a. secondary to splenomegaly related to lymphoma with suspicion of bone marrow involvement. (Plavix had to be stopped due to this)  . Aortic stenosis, moderate     a. Echo 02/14/12 EF 55%, Gr 2 DD, Mod AS (mean grad: 29, peak grad 52)  . Splenomegaly 04/13/2012  . History of blood transfusion 04/13/2012  . 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 Dermatitis 06/07/2012    acnieform rash on back 06/06/12  . COPD (chronic obstructive pulmonary disease)   . Peripheral vascular disease     a. s/p L ray amp 10/13;  b. s/p L 2nd toe amp 12/13  . Pulmonary nodule     8mm RUL calcified pulm nodule noted on CT staging for lymphoma - stable 10/2012.  . Leg DVT (deep venous thromboembolism), chronic     LE dopplers 5/13, 10/13 and 1/14: chronic DVT involving right mid  femoral vein, left mid femoral vein, and left popliteal vein.  . Osteomyelitis     L first foot ray amputation 09/2012  . Gangrene     L 2nd toe amputation 11/2012    Past Surgical History  Procedure Laterality Date  . Coronary artery bypass graft  1995  . Rotator cuff repair    . Carpal tunnel release    . Incisional hernia repair    . Kidney surgery    . Back surgery    . I&d extremity  09/16/2012    Procedure: IRRIGATION AND DEBRIDEMENT EXTREMITY;  Surgeon: [name], MD;  Location: [facility] ORS;  Service: Orthopedics;  Laterality: Left;  irrigation and debridement and first Ray amputation of left foot  . Amputation  09/16/2012    Procedure: AMPUTATION RAY;  Surgeon: [name], MD;   Location: [facility];  Service: Orthopedics;  Laterality: Left;  first Ray amputation left foot  . Amputation  11/21/2012    Procedure: AMPUTATION RAY;  Surgeon: [name], MD;  Location: [facility] OR;  Service: Orthopedics;  Laterality: Left;  LEFT SECOND TOE AMPUTATION    Family History  Problem Relation Age of Onset  . Coronary artery disease    . Alcohol abuse      History  Substance Use Topics  . Smoking status: Former Smoker -- 1.00 packs/day for 30 years    Types: Cigarettes, Pipe    Quit date: 03/29/1985  . Smokeless tobacco: Never Used     Comment: quit in 1986  . Alcohol Use: No   Lives at home Lives with spouse   Review of Systems  Unable to perform ROS: Mental status change    Allergies  Azithromycin; Macrodantin; Minocycline hcl; Nitrofurantoin; Zithromax; and Contrast media  Home Medications   Current Outpatient Rx  Name  Route  Sig  Dispense  Refill  . acyclovir (ZOVIRAX) 400 MG tablet   Oral   Take 400 mg by mouth 2 (two) times daily.         Marland Kitchen aspirin EC 81 MG tablet   Oral   Take 81 mg by mouth every morning.          . gabapentin (NEURONTIN) 300 MG capsule   Oral   Take 300-600 mg by mouth 3 (three) times daily. TAKES 2 CAPSULES IN THE MORNING AND 2 AT LUNCH AND 1 AT NIGHT         . isosorbide mononitrate (IMDUR) 30 MG 24 hr tablet   Oral   Take 1 tablet (30 mg total) by mouth daily.   30 tablet   11   . losartan (COZAAR) 100 MG tablet   Oral   Take 1 tablet by mouth daily.          . meloxicam (MOBIC) 15 MG tablet   Oral   Take 15 mg by mouth daily.         . metFORMIN (GLUCOPHAGE) 500 MG tablet   Oral   Take 1,000 mg by mouth daily.         . metoprolol succinate (TOPROL-XL) 25 MG 24 hr tablet   Oral   Take 25 mg by mouth every morning.          . ondansetron (ZOFRAN-ODT) 8 MG disintegrating tablet   Sublingual   Place 8 mg under the tongue every 8 (eight) hours as needed. For nausea.         . pravastatin  (PRAVACHOL) 40 MG tablet   Oral  Take 40 mg by mouth daily.         . vitamin B-12 (CYANOCOBALAMIN) 500 MCG tablet   Oral   Take 500 mcg by mouth daily.           BP 145/65  Pulse 87  Temp(Src) 101.2 F (38.4 C) (Rectal)  Resp 25  SpO2 94%  Vital signs normal except for fever   Physical Exam  Nursing note and vitals reviewed. Constitutional: He appears well-developed and well-nourished.  Non-toxic appearance. He does not appear ill. No distress.  HENT:  Head: Normocephalic and atraumatic.  Right Ear: External ear normal.  Left Ear: External ear normal.  Nose: Nose normal. No mucosal edema or rhinorrhea.  Mouth/Throat: Mucous membranes are normal. No dental abscesses or edematous.  Tongue dry  Eyes: Conjunctivae and EOM are normal. Pupils are equal, round, and reactive to light.  Neck: Normal range of motion and full passive range of motion without pain. Neck supple.  Cardiovascular: Normal rate, regular rhythm and normal heart sounds.  Exam reveals no gallop and no friction rub.   No murmur heard. Pulmonary/Chest: Effort normal and breath sounds normal. No respiratory distress. He has no wheezes. He has no rhonchi. He has no rales. He exhibits no tenderness and no crepitus.  Abdominal: Soft. Normal appearance and bowel sounds are normal. He exhibits no distension. There is no tenderness. There is no rebound and no guarding.  Musculoskeletal: Normal range of motion. He exhibits no edema and no tenderness.  Moves all extremities well.   Patient noted to have indication of his left great and second toes. The actual stump appears to be well-healed. There is an area on the bottom of the foot which appears to be an opening in the skin that patient's wife states was draining blood at the orthopedist office. He's noted to have some purplish discoloration underneath the fourth toe nail. He has diffuse redness of the medial aspect of the foot with swelling halfway up the foot with  warmth to touch.  Neurological: He is alert. He has normal strength. No cranial nerve deficit.  Skin: Skin is warm, dry and intact. No rash noted. No erythema. No pallor.  Psychiatric: His speech is normal and behavior is normal. His mood appears not anxious.  Flat affect    ED Course  Procedures (including critical care time)  Medications  0.9 %  sodium chloride infusion (not administered)    Followed by  0.9 %  sodium chloride infusion (not administered)  ondansetron (ZOFRAN) injection 4 mg (not administered)  vancomycin (VANCOCIN) IVPB 1000 mg/200 mL premix (not administered)  piperacillin-tazobactam (ZOSYN) IVPB 3.375 g (not administered)  acetaminophen (TYLENOL) tablet 1,000 mg (not administered)   Pt started on antibiotics for cellulitis in his left foot. He was given IV fluids b/o his nausea and vomiting and was given zofran for his nausea.  18:34 Dr Evlyn Kanner, will come see patient for admission  Results for orders placed during the hospital encounter of 02/28/13  CBC WITH DIFFERENTIAL      Result Value Range   WBC 4.1  4.0 - 10.5 K/uL   RBC 3.13 (*) 4.22 - 5.81 MIL/uL   Hemoglobin 10.9 (*) 13.0 - 17.0 g/dL   HCT 16.1 (*) 09.6 - 04.5 %   MCV 99.7  78.0 - 100.0 fL   MCH 34.8 (*) 26.0 - 34.0 pg   MCHC 34.9  30.0 - 36.0 g/dL   RDW 40.9  81.1 - 91.4 %   Platelets  79 (*) 150 - 400 K/uL   Neutrophils Relative 65  43 - 77 %   Lymphocytes Relative 12  12 - 46 %   Monocytes Relative 23 (*) 3 - 12 %   Eosinophils Relative 0  0 - 5 %   Basophils Relative 0  0 - 1 %   Band Neutrophils 0  0 - 10 %   Metamyelocytes Relative 0     Myelocytes 0     Promyelocytes Absolute 0     Blasts 0     nRBC 0  0 /100 WBC   Neutro Abs 2.7  1.7 - 7.7 K/uL   Lymphs Abs 0.5 (*) 0.7 - 4.0 K/uL   Monocytes Absolute 0.9  0.1 - 1.0 K/uL   Eosinophils Absolute 0.0  0.0 - 0.7 K/uL   Basophils Absolute 0.0  0.0 - 0.1 K/uL   RBC Morphology POLYCHROMASIA PRESENT     WBC Morphology MILD LEFT SHIFT (1-5%  METAS, OCC MYELO, OCC BANDS)     Smear Review LARGE PLATELETS PRESENT    COMPREHENSIVE METABOLIC PANEL      Result Value Range   Sodium 127 (*) 135 - 145 mEq/L   Potassium 3.8  3.5 - 5.1 mEq/L   Chloride 92 (*) 96 - 112 mEq/L   CO2 21  19 - 32 mEq/L   Glucose, Bld 151 (*) 70 - 99 mg/dL   BUN 22  6 - 23 mg/dL   Creatinine, Ser 4.09  0.50 - 1.35 mg/dL   Calcium 9.2  8.4 - 81.1 mg/dL   Total Protein 5.9 (*) 6.0 - 8.3 g/dL   Albumin 3.6  3.5 - 5.2 g/dL   AST 20  0 - 37 U/L   ALT 12  0 - 53 U/L   Alkaline Phosphatase 129 (*) 39 - 117 U/L   Total Bilirubin 1.1  0.3 - 1.2 mg/dL   GFR calc non Af Amer 74 (*) >90 mL/min   GFR calc Af Amer 85 (*) >90 mL/min   Laboratory interpretation all normal except low total WBC without neutropenia   Dg Chest Portable 1 View  02/28/2013  *RADIOLOGY REPORT*  Clinical Data: Chest pain and cough.  Vomiting.  PORTABLE CHEST - 1 VIEW  Comparison: PA and lateral chest 09/07/2012 and CT chest 02/05/2013.  Findings: The patient is status post CABG.  Heart size is mildly enlarged.  Lungs are clear.  No pneumothorax or pleural fluid. Postoperative change left shoulder is noted.  IMPRESSION: No acute abnormality.   Original Report Authenticated By: Holley Dexter, M.D.      Ct Abdomen Pelvis Wo Contrast  Ct Chest Wo Contrast  02/05/2013  *RADIOLOGY REPORT*  Clinical Data:  Follow up mantle cell lymphoma  CT CHEST, ABDOMEN AND PELVIS WITHOUT CONTRAST  Technique:  Multidetector CT imaging of the chest, abdomen and pelvis was performed following the standard protocol without IV contrast.  Comparison:  02/05/2013  CT CHEST  Findings:  Lungs/pleura: There is no pleural effusion identified. Mild basilar predominant interstitial reticulation is identified. 7 mm nodule in the superior segment of right lower lobe is stable, image 30/series 4.  Within the right apex there is an 8 mm irregular density, image 13/series 4. This is unchanged from previous exam.  Pulmonary nodule  within the left lower lobe is unchanged measuring 4 mm, image 32.  This is stable from previous exam.  Heart/Mediastinum: Normal heart size.  No pericardial effusion. Status post CABG.  There is no mediastinal  or hilar adenopathy.  No pericardial or pleural effusion.  Bones/Musculoskeletal:  No axillary or supraclavicular adenopathy noted. There are no aggressive lytic or sclerotic bone lesions identified.  IMPRESSION:  1.  No acute cardiopulmonary abnormalities. 2.  No mass or adenopathy. 3.  Small pulmonary nodules are unchanged from previous exam.  CT ABDOMEN AND PELVIS  Findings:  Calcified granuloma noted within the dome of liver.  No focal liver abnormalities identified.  The gallbladder appears normal.  No biliary dilatation.  The pancreas is unremarkable.  The spleen measures 15 cm in craniocaudal dimension.  This is compared with 16 cm previously.  No focal splenic lesion identified. Normal appearance of the adrenal glands.  Small nonobstructing calculus is noted in the inferior pole the right kidney measuring approximately 2 mm.  Nonobstructing stone within the inferior pole of the left kidney measures 6 mm, image 64/series 2.  The urinary bladder is within normal limits.  Prostate gland and seminal vesicles are unremarkable.  Normal caliber of the abdominal aorta. Gastrohepatic ligament lymph node measures 1 cm, image 66/series 2.  Previously 1.1 cm.  No pelvic or inguinal adenopathy. The stomach is normal.  Small bowel loops are unremarkable.  The appendix is visualized and appears normal.  Normal caliber of the colon.  Peritoneal nodule within the upper abdomen measures 2.30 cm, image 60/series 2.  Previously this measured 1.9 cm  Review of the visualized osseous structures is negative for lytic or sclerotic bone change.  There is multilevel spondylosis identified within the lumbar spine.  IMPRESSION:  1.  No acute findings. 2.  Slight increase in size of the peritoneal nodule within the upper abdomen.  Stable gastrohepatic ligament lymph node.  3.  Stable splenomegaly.   Original Report Authenticated By: Signa Kell, M.D.        1. Fever   2. Cellulitis of left foot   3. Nausea and vomiting   4. Anemia     Plan admission   Devoria Albe, MD, FACEP   MDM          Ward Givens, MD 02/28/13 [phone]

## 2013-03-01 ENCOUNTER — Encounter (HOSPITAL_COMMUNITY)
Admission: EM | Disposition: A | Discharge status: Skilled Nursing Facility | Payer: Self-pay | Source: Home / Self Care | Attending: Internal Medicine

## 2013-03-01 ENCOUNTER — Encounter (HOSPITAL_COMMUNITY): Payer: Self-pay | Admitting: Anesthesiology

## 2013-03-01 ENCOUNTER — Inpatient Hospital Stay (HOSPITAL_COMMUNITY): Payer: Medicare Other | Admitting: Anesthesiology

## 2013-03-01 HISTORY — PX: I & D EXTREMITY: SHX5045

## 2013-03-01 LAB — COMPREHENSIVE METABOLIC PANEL
ALT: 12 U/L (ref 0–53)
BUN: 19 mg/dL (ref 6–23)
CO2: 22 mEq/L (ref 19–32)
Calcium: 9 mg/dL (ref 8.4–10.5)
Creatinine, Ser: 1 mg/dL (ref 0.50–1.35)
GFR calc Af Amer: 78 mL/min — ABNORMAL LOW (ref >90)
GFR calc non Af Amer: 67 mL/min — ABNORMAL LOW (ref >90)
Glucose, Bld: 134 mg/dL — ABNORMAL HIGH (ref 70–99)
Sodium: 130 mEq/L — ABNORMAL LOW (ref 135–145)
Total Protein: 5.6 g/dL — ABNORMAL LOW (ref 6.0–8.3)

## 2013-03-01 LAB — URINE CULTURE
Colony Count: NO GROWTH
Culture: NO GROWTH

## 2013-03-01 LAB — CBC
HCT: 30.4 % — ABNORMAL LOW (ref 39.0–52.0)
Hemoglobin: 10.9 g/dL — ABNORMAL LOW (ref 13.0–17.0)
MCHC: 35.9 g/dL (ref 30.0–36.0)
RBC: 3.07 MIL/uL — ABNORMAL LOW (ref 4.22–5.81)
WBC: 3.7 10*3/uL — ABNORMAL LOW (ref 4.0–10.5)

## 2013-03-01 LAB — GLUCOSE, CAPILLARY
Glucose-Capillary: 140 mg/dL — ABNORMAL HIGH (ref 70–99)
Glucose-Capillary: 83 mg/dL (ref 70–99)

## 2013-03-01 LAB — HEMOGLOBIN A1C: Mean Plasma Glucose: 131 mg/dL — ABNORMAL HIGH (ref <117)

## 2013-03-01 SURGERY — IRRIGATION AND DEBRIDEMENT EXTREMITY
Anesthesia: General | Site: Foot | Laterality: Left | Wound class: Dirty or Infected

## 2013-03-01 MED ORDER — SODIUM CHLORIDE 0.9 % IV SOLN
INTRAVENOUS | Status: DC | PRN
  Administered 2013-03-01: 3000 mL via INTRAMUSCULAR

## 2013-03-01 MED ORDER — PROPOFOL 10 MG/ML IV EMUL
INTRAVENOUS | Status: DC | PRN
  Administered 2013-03-01: 100 ug/kg/min via INTRAVENOUS

## 2013-03-01 MED ORDER — ROPIVACAINE HCL 5 MG/ML IJ SOLN
INTRAMUSCULAR | Status: DC | PRN
  Administered 2013-03-01: 26 mL

## 2013-03-01 MED ORDER — CHLORHEXIDINE GLUCONATE 4 % EX LIQD: 60.0000 mL | Freq: Once | CUTANEOUS | Status: DC

## 2013-03-01 MED ORDER — SODIUM CHLORIDE 0.9 % IV SOLN
INTRAVENOUS | Status: DC
  Administered 2013-03-04 – 2013-03-05 (×3): via INTRAVENOUS

## 2013-03-01 MED ORDER — METOPROLOL SUCCINATE ER 25 MG PO TB24
25.0000 mg | ORAL_TABLET | Freq: Every day | ORAL | Status: DC
  Administered 2013-03-01 – 2013-03-05 (×5): 25 mg via ORAL
  Filled 2013-03-01 (×5): qty 1

## 2013-03-01 MED ORDER — ASPIRIN EC 81 MG PO TBEC
81.0000 mg | DELAYED_RELEASE_TABLET | Freq: Every day | ORAL | Status: DC
  Administered 2013-03-01 – 2013-03-05 (×5): 81 mg via ORAL
  Filled 2013-03-01 (×5): qty 1

## 2013-03-01 MED ORDER — GABAPENTIN 300 MG PO CAPS
600.0000 mg | ORAL_CAPSULE | ORAL | Status: DC
  Administered 2013-03-01 – 2013-03-05 (×10): 600 mg via ORAL
  Filled 2013-03-01 (×12): qty 2

## 2013-03-01 MED ORDER — ONDANSETRON HCL 4 MG/2ML IJ SOLN
4.0000 mg | INTRAMUSCULAR | Status: DC | PRN
  Administered 2013-03-01: 4 mg via INTRAVENOUS
  Filled 2013-03-01: qty 2

## 2013-03-01 MED ORDER — GABAPENTIN 300 MG PO CAPS
300.0000 mg | ORAL_CAPSULE | Freq: Every day | ORAL | Status: DC
  Administered 2013-03-01: 300 mg via ORAL
  Filled 2013-03-01 (×2): qty 1

## 2013-03-01 MED ORDER — ACYCLOVIR 400 MG PO TABS
400.0000 mg | ORAL_TABLET | Freq: Two times a day (BID) | ORAL | Status: DC
  Administered 2013-03-01 – 2013-03-05 (×10): 400 mg via ORAL
  Filled 2013-03-01 (×11): qty 1

## 2013-03-01 MED ORDER — ISOSORBIDE MONONITRATE ER 30 MG PO TB24
30.0000 mg | ORAL_TABLET | Freq: Every day | ORAL | Status: DC
  Administered 2013-03-01 – 2013-03-05 (×4): 30 mg via ORAL
  Filled 2013-03-01 (×6): qty 1

## 2013-03-01 MED ORDER — FENTANYL CITRATE 0.05 MG/ML IJ SOLN
INTRAMUSCULAR | Status: DC | PRN
  Administered 2013-03-01 (×2): 50 ug via INTRAVENOUS

## 2013-03-01 MED ORDER — SIMVASTATIN 10 MG PO TABS
10.0000 mg | ORAL_TABLET | Freq: Every day | ORAL | Status: DC
  Administered 2013-03-02 – 2013-03-04 (×2): 10 mg via ORAL
  Filled 2013-03-01 (×4): qty 1

## 2013-03-01 MED ORDER — BACITRACIN ZINC 500 UNIT/GM EX OINT
TOPICAL_OINTMENT | CUTANEOUS | Status: DC | PRN
  Administered 2013-03-01: 1 via TOPICAL

## 2013-03-01 MED ORDER — CYANOCOBALAMIN 250 MCG PO TABS
500.0000 ug | ORAL_TABLET | Freq: Every day | ORAL | Status: DC
  Administered 2013-03-01 – 2013-03-05 (×5): 500 ug via ORAL
  Filled 2013-03-01 (×5): qty 2

## 2013-03-01 MED ORDER — ENOXAPARIN SODIUM 40 MG/0.4ML ~~LOC~~ SOLN
40.0000 mg | SUBCUTANEOUS | Status: DC
  Administered 2013-03-01 – 2013-03-04 (×4): 40 mg via SUBCUTANEOUS
  Filled 2013-03-01 (×5): qty 0.4

## 2013-03-01 MED ORDER — 0.9 % SODIUM CHLORIDE (POUR BTL) OPTIME
TOPICAL | Status: DC | PRN
  Administered 2013-03-01: 1000 mL

## 2013-03-01 MED ORDER — LOSARTAN POTASSIUM 50 MG PO TABS
100.0000 mg | ORAL_TABLET | Freq: Every day | ORAL | Status: DC
  Administered 2013-03-01 – 2013-03-05 (×5): 100 mg via ORAL
  Filled 2013-03-01 (×5): qty 2

## 2013-03-01 MED ORDER — OXYCODONE HCL 5 MG PO TABS
5.0000 mg | ORAL_TABLET | Freq: Four times a day (QID) | ORAL | Status: DC | PRN
  Administered 2013-03-02 – 2013-03-04 (×5): 5 mg via ORAL
  Filled 2013-03-01 (×5): qty 1

## 2013-03-01 MED ORDER — ONDANSETRON 8 MG PO TBDP
8.0000 mg | ORAL_TABLET | Freq: Three times a day (TID) | ORAL | Status: DC | PRN
  Administered 2013-03-01: 8 mg via ORAL
  Filled 2013-03-01: qty 1

## 2013-03-01 SURGICAL SUPPLY — 52 items
BAG ZIPLOCK 12X15 (MISCELLANEOUS) ×2 IMPLANT
BANDAGE CONFORM 3  STR LF (GAUZE/BANDAGES/DRESSINGS) IMPLANT
BANDAGE ELASTIC 4 VELCRO ST LF (GAUZE/BANDAGES/DRESSINGS) ×2 IMPLANT
BANDAGE ELASTIC 6 VELCRO ST LF (GAUZE/BANDAGES/DRESSINGS) IMPLANT
BNDG ESMARK 4X9 LF (GAUZE/BANDAGES/DRESSINGS) ×2 IMPLANT
CLOTH BEACON ORANGE TIMEOUT ST (SAFETY) ×2 IMPLANT
CONT SPECI 4OZ STER CLIK (MISCELLANEOUS) ×2 IMPLANT
CUFF TOURN SGL QUICK 34 (TOURNIQUET CUFF)
CUFF TRNQT CYL 34X4X40X1 (TOURNIQUET CUFF) IMPLANT
DECANTER SPIKE VIAL GLASS SM (MISCELLANEOUS) ×2 IMPLANT
DRAPE C-ARM 42X72 X-RAY (DRAPES) IMPLANT
DRAPE LG THREE QUARTER DISP (DRAPES) ×2 IMPLANT
DRAPE OEC MINIVIEW 54X84 (DRAPES) IMPLANT
DRAPE SURG 17X11 SM STRL (DRAPES) ×2 IMPLANT
DRAPE U-SHAPE 47X51 STRL (DRAPES) ×2 IMPLANT
DRSG ADAPTIC 3X8 NADH LF (GAUZE/BANDAGES/DRESSINGS) ×2 IMPLANT
DRSG PAD ABDOMINAL 8X10 ST (GAUZE/BANDAGES/DRESSINGS) ×2 IMPLANT
DURAPREP 26ML APPLICATOR (WOUND CARE) ×2 IMPLANT
ELECT REM PT RETURN 9FT ADLT (ELECTROSURGICAL) ×2
ELECTRODE REM PT RTRN 9FT ADLT (ELECTROSURGICAL) ×1 IMPLANT
FACESHIELD LNG OPTICON STERILE (SAFETY) IMPLANT
GAUZE PACKING IODOFORM 1 (PACKING) IMPLANT
GAUZE PACKING IODOFORM 1/2 (PACKING) IMPLANT
GLOVE BIOGEL PI IND STRL 8 (GLOVE) ×1 IMPLANT
GLOVE BIOGEL PI INDICATOR 8 (GLOVE) ×1
GLOVE ECLIPSE 8.0 STRL XLNG CF (GLOVE) ×4 IMPLANT
GLOVE ORTHO TXT STRL SZ7.5 (GLOVE) ×2 IMPLANT
KIT BASIN OR (CUSTOM PROCEDURE TRAY) ×2 IMPLANT
MANIFOLD NEPTUNE II (INSTRUMENTS) ×2 IMPLANT
NEEDLE HYPO 22GX1.5 SAFETY (NEEDLE) IMPLANT
NEEDLE MAYO .5 CIRCLE (NEEDLE) IMPLANT
NS IRRIG 1000ML POUR BTL (IV SOLUTION) ×2 IMPLANT
PACK LOWER EXTREMITY WL (CUSTOM PROCEDURE TRAY) ×2 IMPLANT
PAD CAST 4YDX4 CTTN HI CHSV (CAST SUPPLIES) ×1 IMPLANT
PADDING CAST COTTON 4X4 STRL (CAST SUPPLIES) ×1
POSITIONER SURGICAL ARM (MISCELLANEOUS) ×2 IMPLANT
SET CYSTO W/LG BORE CLAMP LF (SET/KITS/TRAYS/PACK) ×2 IMPLANT
SPONGE GAUZE 4X4 12PLY (GAUZE/BANDAGES/DRESSINGS) ×2 IMPLANT
SPONGE LAP 4X18 X RAY DECT (DISPOSABLE) ×4 IMPLANT
STAPLER VISISTAT 35W (STAPLE) ×2 IMPLANT
STRIP CLOSURE SKIN 1/2X4 (GAUZE/BANDAGES/DRESSINGS) ×2 IMPLANT
SUT ETHIBOND NAB BRD #0 18IN (SUTURE) IMPLANT
SUT ETHILON 2 0 PS N (SUTURE) ×2 IMPLANT
SUT PROLENE 3 0 PS 2 (SUTURE) IMPLANT
SUT VIC AB 2-0 CT1 27 (SUTURE)
SUT VIC AB 2-0 CT1 TAPERPNT 27 (SUTURE) IMPLANT
SUT VIC AB 3-0 PS2 18 (SUTURE)
SUT VIC AB 3-0 PS2 18XBRD (SUTURE) IMPLANT
SWAB COLLECTION DEVICE MRSA (MISCELLANEOUS) IMPLANT
SYR CONTROL 10ML LL (SYRINGE) ×2 IMPLANT
TUBE ANAEROBIC SPECIMEN COL (MISCELLANEOUS) IMPLANT
WATER STERILE IRR 1500ML POUR (IV SOLUTION) ×2 IMPLANT

## 2013-03-01 NOTE — Progress Notes (Addendum)
LAB CALLED POSITIVE  BLOOD CULTURE DRAWN 3/26 AT 1725, ANAEROBIC BOTTLE GRAM + COCCI IN CLUSTERS, RESULTS CALLED TO DR. Jacky Kindle.

## 2013-03-01 NOTE — Brief Op Note (Signed)
02/28/2013 - 03/01/2013  5:59 PM  PATIENT:  [name]  79 y.o. male  PRE-OPERATIVE DIAGNOSIS:  Left plantar foot ulcer and cellulitis  POST-OPERATIVE DIAGNOSIS:  Left plantar foot ulcer, abscess and cellulitis  Procedure(s): 1.  Irrigation and debridement of multiple areas of the left foot 2.  Revision left 1st ray amputation  SURGEON:  [name], MD  ASSISTANT: n/a  ANESTHESIA:   General, regional  EBL:  minimal   TOURNIQUET:   Total Tourniquet Time Documented: Calf (Left) - 17 minutes Total: Calf (Left) - 17 minutes   COMPLICATIONS:  None apparent  DISPOSITION:  Extubated, awake and stable to recovery.  DICTATION ID:  [id number]

## 2013-03-01 NOTE — Progress Notes (Signed)
Pt came up from OR. He is in NAD, complaining of no pain, no SOB, or dyspnea. Will continue to monitor.

## 2013-03-01 NOTE — Progress Notes (Signed)
Nutrition Brief Note  Patient identified on the Malnutrition Screening Tool (MST) Report  Body mass index is 25.6 kg/(m^2). Patient meets criteria for overweight based on current BMI.   Pt NPO r/t surgery for left foot abscess and probable osteomyelitis. Diet likely to be resumed soon back to CHO modified. Labs and medications reviewed. Pt not eating well for 3 days PTA r/t nausea and vomiting. Pt reports before then he was eating 3 meals/day with stable weight and good blood sugar control. Discussed importance of blood sugar control and protein for wound healing.   No nutrition interventions warranted at this time. If nutrition issues arise, please consult RD.   Levon Hedger MS, RD, LDN [phone] Pager [phone] After Hours Pager

## 2013-03-01 NOTE — Preoperative (Signed)
Beta Blockers   Reason not to administer Beta Blockers:Not Applicable 

## 2013-03-01 NOTE — Anesthesia Postprocedure Evaluation (Signed)
Anesthesia Post Note  Patient: [name]  Procedure(s) Performed: Procedure(s) (LRB): IRRIGATION AND DEBRIDEMENT EXTREMITY (Left)  Anesthesia type: MAC  Patient location: PACU  Post pain: Pain level controlled  Post assessment: Post-op Vital signs reviewed  Last Vitals: BP 147/63  Pulse 56  Temp(Src) 36.9 C (Oral)  Resp 18  Ht 6\' 1"  (1.854 m)  Wt 194 lb 0.1 oz (88 kg)  BMI 25.6 kg/m2  SpO2 99%  Post vital signs: Reviewed  Level of consciousness: awake  Complications: No apparent anesthesia complications

## 2013-03-01 NOTE — Care Management (Signed)
CARE MANAGEMENT NOTE 03/01/2013  Patient:  SKYLIER, KRETSCHMER   Account Number:  [id number]  Date Initiated:  03/01/2013  Documentation initiated by:  Xoie Kreuser  Subjective/Objective Assessment:   79 yo male admitted with osteomylitis. Hx or cell lypmhoma of lymph nodes , DM,& two previous foot amputations.     Action/Plan:   Home vs SNF   Anticipated DC Date:     Anticipated DC Plan:    In-house referral  Clinical Social Worker      DC Planning Services  CM consult      Choice offered to / List presented to:             Status of service:  In process, will continue to follow Medicare Important Message given?   (If response is "NO", the following Medicare IM given date fields will be blank) Date Medicare IM given:   Date Additional Medicare IM given:    Discharge Disposition:    Per UR Regulation:  Reviewed for med. necessity/level of care/duration of stay  If discussed at Long Length of Stay Meetings, dates discussed:    Comments:  03/01/13 1155 Darlen Gledhill,RN,BSN [phone]

## 2013-03-01 NOTE — Anesthesia Procedure Notes (Addendum)
Anesthesia Regional Block:  Ankle block  Pre-Anesthetic Checklist: ,, timeout performed, Correct Patient, Correct Site, Correct Laterality, Correct Procedure, Correct Position, site marked, Risks and benefits discussed,  Surgical consent,  Pre-op evaluation,  At surgeon's request and post-op pain management  Laterality: Left  Prep: chloraprep       Needles:  Injection technique: Single-shot  Needle Type: Other     Needle Length: 5cm 5 cm     Additional Needles:  Procedures: other  (Landmarks) Ankle block Narrative:  Start time: 03/01/2013 4:28 PM End time: 03/01/2013 4:36 PM Injection made incrementally with aspirations every 5 mL.  Performed by: Personally  Anesthesiologist: Renold Don

## 2013-03-01 NOTE — Consult Note (Signed)
Reason for Consult:left foot infection Referring Physician: Dr. Rona Ravens is an 79 y.o. male.  HPI: 79 y/o male with PMH of diabetes s/p left forefoot amputations x 2 presented to clinic yesterday c/o one day h/o drainage from his left foot.  The day prior he had noted onset of nausea, vomiting,fever and chills.  He denies any pain in his foot.  He has not had any trouble with the foot ove rthe last month or so until yesterday.  He was sent to the ER at [facility] via EMS after not being able to walk safely in clinic.   Past Medical History  Diagnosis Date  . CAD (coronary artery disease)     a. s/p CABG 1995;  b. NSTEMI & subsequent BMS to SVG->right PDA 02/03/12.;  c. Cath 02/14/2012 3vd with 4/4 patent grafts and patent stent in vg->pda.  . Carotid stenosis     a. Carotid dopplers 2/13: RICA 40-59%, LICA 0-39%;  b. dopplers 3/14:  40-59% RICA, 0-39% LICA  . HTN (hypertension)   . HLD (hyperlipidemia)   . DM2 (diabetes mellitus, type 2)   . DJD (degenerative joint disease)   . Asthmatic bronchitis   . BPH (benign prostatic hypertrophy)   . Herpes zoster   . Anemia   . Thrombocytopenia     a. secondary to splenomegaly related to lymphoma with suspicion of bone marrow involvement. (Plavix had to be stopped due to this)  . Aortic stenosis, moderate     a. Echo 02/14/12 EF 55%, Gr 2 DD, Mod AS (mean grad: 29, peak grad 52)  . Splenomegaly 04/13/2012  . History of blood transfusion 04/13/2012  . 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 Dermatitis 06/07/2012    acnieform rash on back 06/06/12  . COPD (chronic obstructive pulmonary disease)   . Peripheral vascular disease     a. s/p L ray amp 10/13;  b. s/p L 2nd toe amp 12/13  . Pulmonary nodule     8mm RUL calcified pulm nodule noted on CT staging for lymphoma - stable 10/2012.  . Leg DVT (deep venous thromboembolism), chronic     LE dopplers 5/13, 10/13 and 1/14:  chronic DVT involving right mid femoral vein, left mid femoral vein, and left popliteal vein.  . Osteomyelitis     L first foot ray amputation 09/2012  . Gangrene     L 2nd toe amputation 11/2012    Past Surgical History  Procedure Laterality Date  . Coronary artery bypass graft  1995  . Rotator cuff repair    . Carpal tunnel release    . Incisional hernia repair    . Kidney surgery    . Back surgery    . I&d extremity  09/16/2012    Procedure: IRRIGATION AND DEBRIDEMENT EXTREMITY;  Surgeon: [name], MD;  Location: [facility] ORS;  Service: Orthopedics;  Laterality: Left;  irrigation and debridement and first Ray amputation of left foot  . Amputation  09/16/2012    Procedure: AMPUTATION RAY;  Surgeon: [name], MD;  Location: [facility]S;  Service: Orthopedics;  Laterality: Left;  first Ray amputation left foot  . Amputation  11/21/2012    Procedure: AMPUTATION RAY;  Surgeon: [name], MD;  Location: [facility] OR;  Service: Orthopedics;  Laterality: Left;  LEFT SECOND TOE AMPUTATION    Family History  Problem Relation Age of Onset  . Coronary artery disease    .  Alcohol abuse      Social History:  reports that he quit smoking about 27 years ago. His smoking use included Cigarettes and Pipe. He has a 30 pack-year smoking history. He has never used smokeless tobacco. He reports that he does not drink alcohol or use illicit drugs.  Allergies:  Allergies  Allergen Reactions  . Azithromycin Itching  . Macrodantin Itching  . Minocycline Hcl Itching  . Nitrofurantoin Itching  . Zithromax (Azithromycin Dihydrate) Itching  . Contrast Media (Iodinated Diagnostic Agents) Rash    Medications: I have reviewed the patient's current medications.  Results for orders placed during the hospital encounter of 02/28/13 (from the past 48 hour(s))  CBC WITH DIFFERENTIAL     Status: Abnormal   Collection Time    02/28/13  5:25 PM      Result Value Range   WBC 4.1  4.0 - 10.5 K/uL   RBC 3.13 (*) 4.22  - 5.81 MIL/uL   Hemoglobin 10.9 (*) 13.0 - 17.0 g/dL   HCT 47.8 (*) 29.5 - 62.1 %   MCV 99.7  78.0 - 100.0 fL   MCH 34.8 (*) 26.0 - 34.0 pg   MCHC 34.9  30.0 - 36.0 g/dL   RDW 30.8  65.7 - 84.6 %   Platelets 79 (*) 150 - 400 K/uL   Comment: SPECIMEN CHECKED FOR CLOTS     REPEATED TO VERIFY   Neutrophils Relative 65  43 - 77 %   Lymphocytes Relative 12  12 - 46 %   Monocytes Relative 23 (*) 3 - 12 %   Eosinophils Relative 0  0 - 5 %   Basophils Relative 0  0 - 1 %   Band Neutrophils 0  0 - 10 %   Metamyelocytes Relative 0     Myelocytes 0     Promyelocytes Absolute 0     Blasts 0     nRBC 0  0 /100 WBC   Neutro Abs 2.7  1.7 - 7.7 K/uL   Lymphs Abs 0.5 (*) 0.7 - 4.0 K/uL   Monocytes Absolute 0.9  0.1 - 1.0 K/uL   Eosinophils Absolute 0.0  0.0 - 0.7 K/uL   Basophils Absolute 0.0  0.0 - 0.1 K/uL   RBC Morphology POLYCHROMASIA PRESENT     WBC Morphology MILD LEFT SHIFT (1-5% METAS, OCC MYELO, OCC BANDS)     Comment: TOXIC GRANULATION     DOHLE BODIES     ATYPICAL LYMPHOCYTES   Smear Review LARGE PLATELETS PRESENT     Comment: GIANT PLATELETS SEEN  COMPREHENSIVE METABOLIC PANEL     Status: Abnormal   Collection Time    02/28/13  5:25 PM      Result Value Range   Sodium 127 (*) 135 - 145 mEq/L   Potassium 3.8  3.5 - 5.1 mEq/L   Chloride 92 (*) 96 - 112 mEq/L   CO2 21  19 - 32 mEq/L   Glucose, Bld 151 (*) 70 - 99 mg/dL   BUN 22  6 - 23 mg/dL   Creatinine, Ser 9.62  0.50 - 1.35 mg/dL   Calcium 9.2  8.4 - 95.2 mg/dL   Total Protein 5.9 (*) 6.0 - 8.3 g/dL   Albumin 3.6  3.5 - 5.2 g/dL   AST 20  0 - 37 U/L   ALT 12  0 - 53 U/L   Alkaline Phosphatase 129 (*) 39 - 117 U/L   Total Bilirubin 1.1  0.3 - 1.2 mg/dL  GFR calc non Af Amer 74 (*) >90 mL/min   GFR calc Af Amer 85 (*) >90 mL/min   Comment:            The eGFR has been calculated     using the CKD EPI equation.     This calculation has not been     validated in all clinical     situations.     eGFR's persistently      <90 mL/min signify     possible Chronic Kidney Disease.  HEMOGLOBIN A1C     Status: Abnormal   Collection Time    02/28/13  5:25 PM      Result Value Range   Hemoglobin A1C 6.2 (*) <5.7 %   Comment: (NOTE)                                                                               According to the ADA Clinical Practice Recommendations for 2011, when     HbA1c is used as a screening test:      >=6.5%   Diagnostic of Diabetes Mellitus               (if abnormal result is confirmed)     5.7-6.4%   Increased risk of developing Diabetes Mellitus     References:Diagnosis and Classification of Diabetes Mellitus,Diabetes     Care,2011,34(Suppl 1):S62-S69 and Standards of Medical Care in             Diabetes - 2011,Diabetes Care,2011,34 (Suppl 1):S11-S61.   Mean Plasma Glucose 131 (*) <117 mg/dL  URINALYSIS, ROUTINE W REFLEX MICROSCOPIC     Status: Abnormal   Collection Time    02/28/13  6:11 PM      Result Value Range   Color, Urine YELLOW  YELLOW   APPearance CLEAR  CLEAR   Specific Gravity, Urine 1.022  1.005 - 1.030   pH 6.0  5.0 - 8.0   Glucose, UA NEGATIVE  NEGATIVE mg/dL   Hgb urine dipstick SMALL (*) NEGATIVE   Bilirubin Urine NEGATIVE  NEGATIVE   Ketones, ur NEGATIVE  NEGATIVE mg/dL   Protein, ur 30 (*) NEGATIVE mg/dL   Urobilinogen, UA 1.0  0.0 - 1.0 mg/dL   Nitrite NEGATIVE  NEGATIVE   Leukocytes, UA NEGATIVE  NEGATIVE  URINE MICROSCOPIC-ADD ON     Status: Abnormal   Collection Time    02/28/13  6:11 PM      Result Value Range   Squamous Epithelial / LPF FEW (*) RARE   RBC / HPF 3-6  <3 RBC/hpf   Bacteria, UA FEW (*) RARE   Urine-Other MUCOUS PRESENT    GLUCOSE, CAPILLARY     Status: Abnormal   Collection Time    02/28/13 10:08 PM      Result Value Range   Glucose-Capillary 161 (*) 70 - 99 mg/dL  COMPREHENSIVE METABOLIC PANEL     Status: Abnormal   Collection Time    03/01/13  3:50 AM      Result Value Range   Sodium 130 (*) 135 - 145 mEq/L   Potassium 3.9   3.5 - 5.1 mEq/L   Chloride 95 (*) 96 - 112  mEq/L   CO2 22  19 - 32 mEq/L   Glucose, Bld 134 (*) 70 - 99 mg/dL   BUN 19  6 - 23 mg/dL   Creatinine, Ser 1.61  0.50 - 1.35 mg/dL   Calcium 9.0  8.4 - 09.6 mg/dL   Total Protein 5.6 (*) 6.0 - 8.3 g/dL   Albumin 3.3 (*) 3.5 - 5.2 g/dL   AST 19  0 - 37 U/L   ALT 12  0 - 53 U/L   Alkaline Phosphatase 125 (*) 39 - 117 U/L   Total Bilirubin 1.0  0.3 - 1.2 mg/dL   GFR calc non Af Amer 67 (*) >90 mL/min   GFR calc Af Amer 78 (*) >90 mL/min   Comment:            The eGFR has been calculated     using the CKD EPI equation.     This calculation has not been     validated in all clinical     situations.     eGFR's persistently     <90 mL/min signify     possible Chronic Kidney Disease.  CBC     Status: Abnormal   Collection Time    03/01/13  3:50 AM      Result Value Range   WBC 3.7 (*) 4.0 - 10.5 K/uL   RBC 3.07 (*) 4.22 - 5.81 MIL/uL   Hemoglobin 10.9 (*) 13.0 - 17.0 g/dL   HCT 04.5 (*) 40.9 - 81.1 %   MCV 99.0  78.0 - 100.0 fL   MCH 35.5 (*) 26.0 - 34.0 pg   MCHC 35.9  30.0 - 36.0 g/dL   RDW 91.4  78.2 - 95.6 %   Platelets 72 (*) 150 - 400 K/uL   Comment: CONSISTENT WITH PREVIOUS RESULT    Dg Chest Portable 1 View  02/28/2013  *RADIOLOGY REPORT*  Clinical Data: Chest pain and cough.  Vomiting.  PORTABLE CHEST - 1 VIEW  Comparison: PA and lateral chest 09/07/2012 and CT chest 02/05/2013.  Findings: The patient is status post CABG.  Heart size is mildly enlarged.  Lungs are clear.  No pneumothorax or pleural fluid. Postoperative change left shoulder is noted.  IMPRESSION: No acute abnormality.   Original Report Authenticated By: Holley Dexter, M.D.     ROS:  As above.  No weight loss.  BGs running high for him. PE:  Blood pressure 141/78, pulse 97, temperature 99 F (37.2 C), temperature source Oral, resp. rate 16, height 6\' 1"  (1.854 m), weight 88 kg (194 lb 0.1 oz), SpO2 96.00%. Elderly male in nad.  Alert and oriented.  EOMI.   Respirations unlabored.  Gait is unsteady and wide based.  Left foot with small ulcer plantarly under 1st ray.  Small callous.  Seropurulent drainage.  Mild erythema and swelling.  NTTP.  Mild warmth.  1+ dp pulse.  Diminished sens to LT at the forefoot.  5/5 strength in PF and DF of the ankle.  Surgical sites well healed.  No lymphadenopathy.  Xray:  Plain films show no bony destruction but gas is evident at the left of the distal remaining 1st MT.  Assessment/Plan: Left foot abscess and probable osteomyelitis - to OR this afternoon for I and D and revision 1st ray resection.  The risks and benefits of the alternative treatment options have been discussed in detail.  The patient wishes to proceed with surgery and specifically understands risks of bleeding, infection, nerve damage, blood clots,  need for additional surgery, amputation and death.   [name] 2013-03-22, 7:29 AM

## 2013-03-01 NOTE — Transfer of Care (Signed)
Immediate Anesthesia Transfer of Care Note  Patient: [name]  Procedure(s) Performed: Procedure(s) with comments: IRRIGATION AND DEBRIDEMENT EXTREMITY (Left) - IRRIGATION  AND  DEBRIDEMENT  LEFT  FOOT  Patient Location: PACU  Anesthesia Type:Regional  Level of Consciousness: awake, alert  and oriented  Airway & Oxygen Therapy: Patient Spontanous Breathing and Patient connected to nasal cannula oxygen  Post-op Assessment: Report given to PACU RN and Post -op Vital signs reviewed and stable  Post vital signs: Reviewed and stable  Complications: No apparent anesthesia complications

## 2013-03-01 NOTE — Progress Notes (Signed)
Pt code status is unclear in computer. He has a full code and a DNR order, both of which have an active and inactive date. I called Dr. Eloise Harman and he is going to speak with pt about pt's wishes on code status. Will continue to monitor.

## 2013-03-01 NOTE — Progress Notes (Signed)
Subjective: Complains of right facial pain from shingles and left foot pain. Has a dry cough without dyspnea.  Objective: Vital signs in last 24 hours: Temp:  [98.6 F (37 C)-101.2 F (38.4 C)] 99 F (37.2 C) (03/27 0530) Pulse Rate:  [83-97] 97 (03/27 0530) Resp:  [16-25] 16 (03/27 0530) BP: (133-145)/(59-78) 141/78 mmHg (03/27 0530) SpO2:  [94 %-96 %] 96 % (03/27 0530) Weight:  [88 kg (194 lb 0.1 oz)] 88 kg (194 lb 0.1 oz) (03/26 2030) Weight change:    Intake/Output from previous day: 03/26 0701 - 03/27 0700 In: -  Out: 1275 [Urine:1275]   General appearance: alert, cooperative and no distress Resp: clear to auscultation bilaterally Cardio: regular rate and rhythm and with 2/6 SEM GI: soft, non-tender; bowel sounds normal; no masses,  no organomegaly Extremities: has trace left foot edema and minimal erythema  Lab Results:  Recent Labs  02/28/13 1725 03/01/13 0350  WBC 4.1 3.7*  HGB 10.9* 10.9*  HCT 31.2* 30.4*  PLT 79* 72*   BMET  Recent Labs  02/28/13 1725 03/01/13 0350  NA 127* 130*  K 3.8 3.9  CL 92* 95*  CO2 21 22  GLUCOSE 151* 134*  BUN 22 19  CREATININE 0.99 1.00  CALCIUM 9.2 9.0   CMET CMP     Component Value Date/Time   NA 130* 03/01/2013 0350   NA 139 01/30/2013 0943   K 3.9 03/01/2013 0350   K 4.3 01/30/2013 0943   CL 95* 03/01/2013 0350   CL 106 01/30/2013 0943   CO2 22 03/01/2013 0350   CO2 24 01/30/2013 0943   GLUCOSE 134* 03/01/2013 0350   GLUCOSE 97 01/30/2013 0943   BUN 19 03/01/2013 0350   BUN 27.5* 01/30/2013 0943   CREATININE 1.00 03/01/2013 0350   CREATININE 1.1 01/30/2013 0943   CALCIUM 9.0 03/01/2013 0350   CALCIUM 9.9 01/30/2013 0943   PROT 5.6* 03/01/2013 0350   PROT 6.3* 01/30/2013 0943   ALBUMIN 3.3* 03/01/2013 0350   ALBUMIN 4.0 01/30/2013 0943   AST 19 03/01/2013 0350   AST 25 01/30/2013 0943   ALT 12 03/01/2013 0350   ALT 20 01/30/2013 0943   ALKPHOS 125* 03/01/2013 0350   ALKPHOS 140 01/30/2013 0943   BILITOT 1.0 03/01/2013  0350   BILITOT 0.58 01/30/2013 0943   GFRNONAA 67* 03/01/2013 0350   GFRAA 78* 03/01/2013 0350    CBG (last 3)   Recent Labs  02/28/13 2208 03/01/13 0734  GLUCAP 161* 140*    INR RESULTS:   Lab Results  Component Value Date   INR 1.09 09/08/2012   INR 1.05 03/29/2012   INR 1.13 02/14/2012     Studies/Results: Dg Chest Portable 1 View  02/28/2013  *RADIOLOGY REPORT*  Clinical Data: Chest pain and cough.  Vomiting.  PORTABLE CHEST - 1 VIEW  Comparison: PA and lateral chest 09/07/2012 and CT chest 02/05/2013.  Findings: The patient is status post CABG.  Heart size is mildly enlarged.  Lungs are clear.  No pneumothorax or pleural fluid. Postoperative change left shoulder is noted.  IMPRESSION: No acute abnormality.   Original Report Authenticated By: Holley Dexter, M.D.     Medications: I have reviewed the patient's current medications.  Assessment/Plan: #1 COPD: stable on current meds #2 DM2: stable on SS insulin #3 Post herpetic neuralgia: will increase meds when able to resume po meds.   LOS: 1 day   Ayse Mccartin G 03/01/2013, 7:42 AM

## 2013-03-01 NOTE — Anesthesia Preprocedure Evaluation (Addendum)
Anesthesia Evaluation  Patient identified by MRN, date of birth, ID band Patient awake    Reviewed: Allergy & Precautions, H&P , NPO status , Patient's Chart, lab work & pertinent test results, reviewed documented beta blocker date and time   Airway Mallampati: II TM Distance: >3 FB Neck ROM: Full    Dental  (+) Teeth Intact, Partial Lower and Dental Advisory Given   Pulmonary asthma , COPDformer smoker,  + rhonchi (Faint rhonchi right lung otherwise clear; SaO2 98% on room air)         Cardiovascular hypertension, Pt. on home beta blockers and Pt. on medications + CAD, + Past MI, + Cardiac Stents, + CABG and + Peripheral Vascular Disease + Valvular Problems/Murmurs AS Rhythm:Regular Rate:Normal + Systolic murmurs    Neuro/Psych  Neuromuscular disease negative neurological ROS  negative psych ROS   GI/Hepatic negative GI ROS, Neg liver ROS,   Endo/Other  diabetes, Type 2  Renal/GU negative Renal ROS  negative genitourinary   Musculoskeletal negative musculoskeletal ROS (+)   Abdominal   Peds  Hematology  (+) Blood dyscrasia, anemia , Hx Lymphoma; thrombocytopenia   Anesthesia Other Findings   Reproductive/Obstetrics negative OB ROS                           Anesthesia Physical  Anesthesia Plan  ASA: III and emergent  Anesthesia Plan: Regional and MAC   Post-op Pain Management:    Induction: Intravenous  Airway Management Planned:   Additional Equipment:   Intra-op Plan:   Post-operative Plan:   Informed Consent: I have reviewed the patients History and Physical, chart, labs and discussed the procedure including the risks, benefits and alternatives for the proposed anesthesia with the patient or authorized representative who has indicated his/her understanding and acceptance.   Dental advisory given  Plan Discussed with: CRNA  Anesthesia Plan Comments:        Anesthesia  Quick Evaluation

## 2013-03-02 ENCOUNTER — Encounter (HOSPITAL_COMMUNITY): Payer: Self-pay | Admitting: Orthopedic Surgery

## 2013-03-02 LAB — GLUCOSE, CAPILLARY
Glucose-Capillary: 111 mg/dL — ABNORMAL HIGH (ref 70–99)
Glucose-Capillary: 118 mg/dL — ABNORMAL HIGH (ref 70–99)

## 2013-03-02 MED ORDER — GABAPENTIN 300 MG PO CAPS
300.0000 mg | ORAL_CAPSULE | Freq: Three times a day (TID) | ORAL | Status: DC
  Administered 2013-03-02 – 2013-03-05 (×10): 300 mg via ORAL
  Filled 2013-03-02 (×12): qty 1

## 2013-03-02 NOTE — Progress Notes (Signed)
Clinical Social Work Department BRIEF PSYCHOSOCIAL ASSESSMENT 03/02/2013  Patient:  [name], [name]     Account Number:  [id number]     Admit date:  02/28/2013  Clinical Social Worker:  Dennison Bulla  Date/Time:  03/02/2013 03:40 PM  Referred by:  Physician  Date Referred:  03/02/2013 Referred for  SNF Placement   Other Referral:   Interview type:  Patient Other interview type:    PSYCHOSOCIAL DATA Living Status:  FAMILY Admitted from facility:   Level of care:   Primary support name:  Junious Dresser Primary support relationship to patient:  SPOUSE Degree of support available:   Strong    CURRENT CONCERNS Current Concerns  Post-Acute Placement   Other Concerns:    SOCIAL WORK ASSESSMENT / PLAN CSW received referral to assist with dc planning. Per chart review, MD recommending SNF placement. CSW reviewed chart and met with patient at bedside. No visitors present during assessment.    CSW introduced myself and explained role. Patient reports that he lives at home with wife but MD recommending SNF. Patient reports that wife has been to [facility] four times in the past and he is interested in their facility. Patient reports that he has never been to SNF but understands the details. CSW spoke with [facility] who can offer a bed. CSW shared this information with patient who also requests a private room. CSW spoke with SNF who reports unsure if private room will be available and will have to wait for day of dc. CSW explained Medicare benefits to patient regarding SNF coverage.    CSW completed FL2 and placed on chart for MD signature. CSW will continue to follow to assist with dc.   Assessment/plan status:  Psychosocial Support/Ongoing Assessment of Needs Other assessment/ plan:   Information/referral to community resources:   SNF information    PATIENT'S/FAMILY'S RESPONSE TO PLAN OF CARE: Patient alert and oriented. Patient agreeable to SNF and excited that [facility] can accept him. Patient  thanked CSW for time.        Coverage for [employer]

## 2013-03-02 NOTE — Progress Notes (Signed)
LAB CALLED BLOOD CULTURE  DRAWN 3/26, AEROBIC BOTTLE , GRAM+ COCCI IN CLUSTERS, PT ON VANCOMYCIN  AND ZOSYN. EARLIER BLOOD CULTURE CALLED TO DR. Jacky Kindle.

## 2013-03-02 NOTE — Progress Notes (Signed)
Subjective: 1 Day Post-Op Procedure(s) (LRB): IRRIGATION AND DEBRIDEMENT EXTREMITY (Left) Patient reports pain as mild.  Occasional stabbing pain on top of the foot.  No n/v.  Feeling better.  Objective: Vital signs in last 24 hours: Temp:  [97.8 F (36.6 C)-99.5 F (37.5 C)] 98.3 F (36.8 C) (03/28 0431) Pulse Rate:  [56-89] 89 (03/28 0431) Resp:  [14-18] 18 (03/28 0431) BP: (125-161)/(58-86) 137/80 mmHg (03/28 0431) SpO2:  [94 %-100 %] 94 % (03/28 0431)  Intake/Output from previous day: 03/27 0701 - 03/28 0700 In: 2970.8 [I.V.:2970.8] Out: 1425 [Urine:1400; Blood:25] Intake/Output this shift:     Recent Labs  02/28/13 1725 03/01/13 0350  HGB 10.9* 10.9*    Recent Labs  02/28/13 1725 03/01/13 0350  WBC 4.1 3.7*  RBC 3.13* 3.07*  HCT 31.2* 30.4*  PLT 79* 72*    Recent Labs  02/28/13 1725 03/01/13 0350  NA 127* 130*  K 3.8 3.9  CL 92* 95*  CO2 21 22  BUN 22 19  CREATININE 0.99 1.00  GLUCOSE 151* 134*  CALCIUM 9.2 9.0   No results found for this basename: LABPT, INR,  in the last 72 hours  L foot wound dressed and dry.  Toes with brisk cap refill  No results yet for deep tissue sent for aerobic and anaerobic culture from the OR.  Assessment/Plan: 1 Day Post-Op Procedure(s) (LRB): IRRIGATION AND DEBRIDEMENT EXTREMITY (Left) Pt appears to be feeling better.  He can bear weight as tolerated on his operated foot in a hard sole shoe.  Given his recurrent infection, I'd be inclined to have him on IV abx for a couple of weeks followed by a couple more weeks on orals.  I'll sign off for the weekend and check on him Monday if he's still here.  Otherwise I'll see him back in the office in a week.  [name] 03/02/2013, 7:34 AM

## 2013-03-02 NOTE — Progress Notes (Addendum)
Clinical Social Work Department CLINICAL SOCIAL WORK PLACEMENT NOTE 03/02/2013  Patient:  [name], [name]  Account Number:  [id number] Admit date:  02/28/2013  Clinical Social Worker:  Unk Lightning, LCSW  Date/time:  03/02/2013 03:45 PM  Clinical Social Work is seeking post-discharge placement for this patient at the following level of care:   SKILLED NURSING   (*CSW will update this form in Epic as items are completed)   03/02/2013  Patient/family provided with [facility] Department of Clinical Social Work's list of facilities offering this level of care within the geographic area requested by the patient (or if unable, by the patient's family).  03/02/2013  Patient/family informed of their freedom to choose among providers that offer the needed level of care, that participate in Medicare, Medicaid or managed care program needed by the patient, have an available bed and are willing to accept the patient.  03/02/2013  Patient/family informed of [facility]' ownership interest in [facility], as well as of the fact that they are under no obligation to receive care at this facility.  PASARR submitted to EDS on 03/02/2013 PASARR number received from EDS on 03/02/2013  FL2 transmitted to all facilities in geographic area requested by pt/family on  03/02/2013 FL2 transmitted to all facilities within larger geographic area on   Patient informed that his/her managed care company has contracts with or will negotiate with  certain facilities, including the following:     Patient/family informed of bed offers received:  03/02/2013 Patient chooses bed at [facility] [place] Physician recommends and patient chooses bed at    Patient to be transferred to  on  [facility] [place] on 03/05/2013 Patient to be transferred to facility by ambulance Sharin Mons)  The following physician request were entered in Epic:   Additional Comments:    Jacklynn Lewis, MSW, LCSWA  Clinical Social Work [phone]

## 2013-03-02 NOTE — Progress Notes (Signed)
Brief Pharmacy Note: Vancomycin  Day 3 Vancomycin for diabetic foot ulcer Cultures; Blood - 2/2 GPC Wound - Staph aureus, sens pending  Vancomycin trough 14.2 on Vanc 1gm q12  Goal trough 15-20  Anticipate accumulation of antibiotic, continue same dose Follow cultures  Otho Bellows PharmD Pager [phone] 03/02/2013, 8:09 PM

## 2013-03-02 NOTE — Progress Notes (Signed)
PATIENT ACCIDENTALLY PULLED IV LOOSE, LOST MODERATE AMOUNT OF BLOOD, HGB DRAWN  PRIOR AT 0350, 10.9.

## 2013-03-02 NOTE — Progress Notes (Signed)
Subjective: Having some sharp pain in the foot after surgery yesterday. Given need for IV antibiotics and regular wound care we will plan on discharge to a SNF in the near future. He lives close to the [facility] and would prefer to go there.  Objective: Vital signs in last 24 hours: Temp:  [97.8 F (36.6 C)-99.5 F (37.5 C)] 98.3 F (36.8 C) (03/28 0431) Pulse Rate:  [56-89] 89 (03/28 0431) Resp:  [14-18] 18 (03/28 0431) BP: (125-161)/(58-86) 137/80 mmHg (03/28 0431) SpO2:  [94 %-100 %] 94 % (03/28 0431) Weight change:    Intake/Output from previous day: 03/27 0701 - 03/28 0700 In: 2970.8 [I.V.:2970.8] Out: 1425 [Urine:1400; Blood:25]   General appearance: alert, cooperative and no distress Resp: clear to auscultation bilaterally Cardio: regular rate and rhythm and with a 2/6 SEM GI: soft, non-tender; bowel sounds normal; no masses,  no organomegaly  Lab Results:  Recent Labs  02/28/13 1725 03/01/13 0350  WBC 4.1 3.7*  HGB 10.9* 10.9*  HCT 31.2* 30.4*  PLT 79* 72*   BMET  Recent Labs  02/28/13 1725 03/01/13 0350  NA 127* 130*  K 3.8 3.9  CL 92* 95*  CO2 21 22  GLUCOSE 151* 134*  BUN 22 19  CREATININE 0.99 1.00  CALCIUM 9.2 9.0   CMET CMP     Component Value Date/Time   NA 130* 03/01/2013 0350   NA 139 01/30/2013 0943   K 3.9 03/01/2013 0350   K 4.3 01/30/2013 0943   CL 95* 03/01/2013 0350   CL 106 01/30/2013 0943   CO2 22 03/01/2013 0350   CO2 24 01/30/2013 0943   GLUCOSE 134* 03/01/2013 0350   GLUCOSE 97 01/30/2013 0943   BUN 19 03/01/2013 0350   BUN 27.5* 01/30/2013 0943   CREATININE 1.00 03/01/2013 0350   CREATININE 1.1 01/30/2013 0943   CALCIUM 9.0 03/01/2013 0350   CALCIUM 9.9 01/30/2013 0943   PROT 5.6* 03/01/2013 0350   PROT 6.3* 01/30/2013 0943   ALBUMIN 3.3* 03/01/2013 0350   ALBUMIN 4.0 01/30/2013 0943   AST 19 03/01/2013 0350   AST 25 01/30/2013 0943   ALT 12 03/01/2013 0350   ALT 20 01/30/2013 0943   ALKPHOS 125* 03/01/2013 0350   ALKPHOS 140  01/30/2013 0943   BILITOT 1.0 03/01/2013 0350   BILITOT 0.58 01/30/2013 0943   GFRNONAA 67* 03/01/2013 0350   GFRAA 78* 03/01/2013 0350    CBG (last 3)   Recent Labs  03/01/13 1223 03/01/13 1800 03/01/13 2131  GLUCAP 114* 83 138*    INR RESULTS:   Lab Results  Component Value Date   INR 1.09 09/08/2012   INR 1.05 03/29/2012   INR 1.13 02/14/2012     Studies/Results: Dg Chest Portable 1 View  02/28/2013  *RADIOLOGY REPORT*  Clinical Data: Chest pain and cough.  Vomiting.  PORTABLE CHEST - 1 VIEW  Comparison: PA and lateral chest 09/07/2012 and CT chest 02/05/2013.  Findings: The patient is status post CABG.  Heart size is mildly enlarged.  Lungs are clear.  No pneumothorax or pleural fluid. Postoperative change left shoulder is noted.  IMPRESSION: No acute abnormality.   Original Report Authenticated By: Holley Dexter, M.D.     Medications: I have reviewed the patient's current medications.  Assessment/Plan: #1 Foot Pain: from surgery and I will increase dosing of neurontin. #2 Abscess: will continue IV antibiotics and begin search for a SNF #3 DM2: stable on current meds.   LOS: 2 days  Venetia Prewitt G 03/02/2013, 8:17 AM

## 2013-03-02 NOTE — Op Note (Signed)
NAMEHUDSEN, FEI NO.:  [id number]  MEDICAL RECORD NO.:  [id number]  LOCATION:  1308                         FACILITY:  [facility]  PHYSICIAN:  [name], MD        DATE OF BIRTH:  [birth year]  DATE OF PROCEDURE:  03/01/2013 DATE OF DISCHARGE:                              OPERATIVE REPORT   PREOPERATIVE DIAGNOSIS:  Left plantar foot ulcer and cellulitis.  POSTOPERATIVE DIAGNOSIS:  Left plantar foot ulcer with abscess and cellulitis.  PROCEDURE: 1. Irrigation and debridement of left foot abscess including multiple     areas 2. Revision left first ray amputation.  SURGEON:  [name], MD  ANESTHESIA:  General, regional.  ESTIMATED BLOOD LOSS:  Minimal.  TOURNIQUET TIME:  17 minutes with an ankle Esmarch.  COMPLICATIONS:  None apparent.  DISPOSITION:  Extubated awake and stable to recovery.  INDICATIONS FOR PROCEDURE:  The patient is an 79 year old male, who was admitted yesterday for his left foot ulcer and drainage.  He was also noted to have constitutional symptoms associated with this left foot cellulitis and draining wound.  He presents now for irrigation and debridement of this wound.  He is status post left first ray amputation in the past for a similar ulcer.  He and his wife understand the risks and benefits, the alternative treatment options and elects surgical treatment.  They specifically understand risks of bleeding, infection, nerve damage, blood clots, need for additional surgery, revision amputation, and death.  PROCEDURE IN DETAIL:  After preoperative consent was obtained and the correct operative site was identified, the patient was brought to the operating room and placed supine on the operating table.  General anesthesia was induced.  Preoperative antibiotics were administered. Surgical time-out was taken.  The left lower extremity was prepped and draped in standard sterile fashion.  The foot was exsanguinated and a 4 inch  Esmarch tourniquet was wrapped around the ankle.  The patient's previous surgical incision was identified dorsal to the plantar ulcer. The incision was made and sharp dissection was carried down through the skin.  Immediately evident was an abscess distal to the remaining first metatarsal.  Specimen of deep tissue was obtained and sent to Microbiology for aerobic and anaerobic culture.  Given the proximity of this abscess over the distal end of the first metatarsal, there was concern for residual osteomyelitis of the bone.  Since the remaining ray was not functional, the decision was made to proceed with revision first ray amputation.  The first metatarsal was then shelled out subperiosteally and passed off the field.  The remaining area was debrided sharply with a scalpel from the level of the skin down to the level of the bone removing all necrotic and nonvital tissue.  All purulent material was removed as well.  The ulcer area was cored out to healthy surrounding tissue.  The wound was then irrigated with 3 L of normal saline.  The tourniquet was released.  Hemostasis was achieved. Vertical mattress sutures of 2-0 nylon were used to close the skin incision.  Sterile dressings were applied followed by compression wrap. The patient was awakened from anesthesia and transported to the recovery room in  stable condition.  FOLLOWUP PLAN:  The patient will continue on his IV vancomycin and Zosyn pending the results of his wound cultures.  He may need a course of IV antibiotics at home.     [name], MD     JH/MEDQ  D:  03/01/2013  T:  03/02/2013  Job:  [id number]

## 2013-03-02 NOTE — Progress Notes (Signed)
ANTIBIOTIC CONSULT NOTE - Follow Up  Pharmacy Consult for Vancomycin, Zosyn Indication: Diabetic foot ulcer/abscess  Allergies  Allergen Reactions  . Azithromycin Itching  . Macrodantin Itching  . Minocycline Hcl Itching  . Nitrofurantoin Itching  . Zithromax (Azithromycin Dihydrate) Itching  . Contrast Media (Iodinated Diagnostic Agents) Rash    Patient Measurements: Height: 6\' 1"  (185.4 cm) Weight: 194 lb 0.1 oz (88 kg) IBW/kg (Calculated) : 79.9 Adjusted Body Weight:   Vital Signs: Temp: 98.3 F (36.8 C) (03/28 0431) Temp src: Oral (03/28 0431) BP: 137/80 mmHg (03/28 0431) Pulse Rate: 89 (03/28 0431)  Labs:  Recent Labs  02/28/13 1725 03/01/13 0350  WBC 4.1 3.7*  HGB 10.9* 10.9*  PLT 79* 72*  CREATININE 0.99 1.00   Estimated Creatinine Clearance: 63.3 ml/min (by C-G formula based on Cr of 1).  Microbiology: Recent Results (from the past 720 hour(s))  CULTURE, BLOOD (ROUTINE X 2)     Status: None   Collection Time    02/28/13  5:25 PM      Result Value Range Status   Specimen Description BLOOD LEFT HAND   Final   Special Requests BOTTLES DRAWN AEROBIC AND ANAEROBIC   Final   Culture  Setup Time 03/01/2013 02:01   Final   Culture     Final   Value: GRAM POSITIVE COCCI IN CLUSTERS     Note: Gram Stain Report Called to,Read Back By and Verified With: REBECCA YOW ON 03/01/2013 AT 9:35P BY WILEJ   Report Status PENDING   Incomplete  CULTURE, BLOOD (ROUTINE X 2)     Status: None   Collection Time    02/28/13  5:30 PM      Result Value Range Status   Specimen Description BLOOD RIGHT ARM   Final   Special Requests BOTTLES DRAWN AEROBIC AND ANAEROBIC   Final   Culture  Setup Time 03/01/2013 02:02   Final   Culture     Final   Value: GRAM POSITIVE COCCI IN CLUSTERS     Note: Gram Stain Report Called to,Read Back By and Verified With: Weldon Picking 03/02/2013 5:05AM YIMSU   Report Status PENDING   Incomplete  URINE CULTURE     Status: None   Collection  Time    02/28/13  6:11 PM      Result Value Range Status   Specimen Description URINE, CLEAN CATCH   Final   Special Requests Normal   Final   Culture  Setup Time 03/01/2013 01:33   Final   Colony Count NO GROWTH   Final   Culture NO GROWTH   Final   Report Status 03/01/2013 FINAL   Final  WOUND CULTURE     Status: None   Collection Time    02/28/13  7:01 PM      Result Value Range Status   Specimen Description FOOT   Final   Special Requests Normal   Final   Gram Stain     Final   Value: NO WBC SEEN     NO SQUAMOUS EPITHELIAL CELLS SEEN     RARE GRAM POSITIVE COCCI     IN PAIRS   Culture     Final   Value: MODERATE STAPHYLOCOCCUS AUREUS     Note: RIFAMPIN AND GENTAMICIN SHOULD NOT BE USED AS SINGLE DRUGS FOR TREATMENT OF STAPH INFECTIONS.   Report Status PENDING   Incomplete  SURGICAL PCR SCREEN     Status: Abnormal   Collection Time  03/01/13  8:59 AM      Result Value Range Status   MRSA, PCR NEGATIVE  NEGATIVE Final   Staphylococcus aureus POSITIVE (*) NEGATIVE Final   Comment:            The Xpert SA Assay (FDA     approved for NASAL specimens     in patients over 50 years of age),     is one component of     a comprehensive surveillance     program.  Test performance has     been validated by [employer] for patients greater     than or equal to 72 year old.     It is not intended     to diagnose infection nor to     guide or monitor treatment.  TISSUE CULTURE     Status: None   Collection Time    03/01/13  5:15 PM      Result Value Range Status   Specimen Description FOOT LEFT DEEP TISSUE FROM ABSCESS.   Final   Special Requests NONE   Final   Gram Stain     Final   Value: NO WBC SEEN     NO ORGANISMS SEEN   Culture PENDING   Incomplete   Report Status PENDING   Incomplete    Medical History: Past Medical History  Diagnosis Date  . CAD (coronary artery disease)     a. s/p CABG 1995;  b. NSTEMI & subsequent BMS to SVG->right PDA 02/03/12.;  c.  Cath 02/14/2012 3vd with 4/4 patent grafts and patent stent in vg->pda.  . Carotid stenosis     a. Carotid dopplers 2/13: RICA 40-59%, LICA 0-39%;  b. dopplers 3/14:  40-59% RICA, 0-39% LICA  . HTN (hypertension)   . HLD (hyperlipidemia)   . DM2 (diabetes mellitus, type 2)   . DJD (degenerative joint disease)   . Asthmatic bronchitis   . BPH (benign prostatic hypertrophy)   . Herpes zoster   . Anemia   . Thrombocytopenia     a. secondary to splenomegaly related to lymphoma with suspicion of bone marrow involvement. (Plavix had to be stopped due to this)  . Aortic stenosis, moderate     a. Echo 02/14/12 EF 55%, Gr 2 DD, Mod AS (mean grad: 29, peak grad 52)  . Splenomegaly 04/13/2012  . History of blood transfusion 04/13/2012  . 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 Dermatitis 06/07/2012    acnieform rash on back 06/06/12  . COPD (chronic obstructive pulmonary disease)   . Peripheral vascular disease     a. s/p L ray amp 10/13;  b. s/p L 2nd toe amp 12/13  . Pulmonary nodule     8mm RUL calcified pulm nodule noted on CT staging for lymphoma - stable 10/2012.  . Leg DVT (deep venous thromboembolism), chronic     LE dopplers 5/13, 10/13 and 1/14: chronic DVT involving right mid femoral vein, left mid femoral vein, and left popliteal vein.  . Osteomyelitis     L first foot ray amputation 09/2012  . Gangrene     L 2nd toe amputation 11/2012    Medications:  Anti-infectives   Start     Dose/Rate Route Frequency Ordered Stop   03/01/13 0800  vancomycin (VANCOCIN) IVPB 1000 mg/200 mL premix     1,000 mg 200 mL/hr over 60 Minutes Intravenous Every  12 hours 02/28/13 1931     03/01/13 0200  piperacillin-tazobactam (ZOSYN) IVPB 3.375 g     3.375 g 12.5 mL/hr over 240 Minutes Intravenous Every 8 hours 02/28/13 1931     03/01/13 0130  acyclovir (ZOVIRAX) tablet 400 mg     400 mg Oral 2 times daily 03/01/13 0103     02/28/13  1830  vancomycin (VANCOCIN) IVPB 1000 mg/200 mL premix     1,000 mg 200 mL/hr over 60 Minutes Intravenous  Once 02/28/13 1824 02/28/13 2219   02/28/13 1830  piperacillin-tazobactam (ZOSYN) IVPB 3.375 g     3.375 g 100 mL/hr over 30 Minutes Intravenous  Once 02/28/13 1824 02/28/13 2018     Assessment: 85 yom present to ED 3/26 with N/V and flu-like symptoms x3 days.  He has a diabetic foot ulcer and a history of amputation of left second toe d/t gangrene 11/2012 and NHL with last chemotherapy on 01/30/13.  Pharmacy asked to assist with Vancomycin and Zosyn dosing for foot infection.  Now D#3 Vancomycin/Zosyn  Patient s/p I&D of infected foot/abscesses and cx's sent  Positive blood cx's with GPC and wound cx growing staph - await final sensitivities  Afebrile  WBC 3.7K but stable  Goal of Therapy:  Vancomycin trough level 15-20 mcg/ml  Plan:   Continue Zosyn 3.375g IV Q8H infused over 4hrs.  Continue Vancomycin 1g IV x1 IV q12h for now - noted plan per ortho to continue IV abx's for at least 2 weeks  Will check a vanc trough tonight  Can likely d/c Zosyn pending final cx results and hopefully can change vanc to a beta-lactam   Hessie Knows, PharmD, BCPS Pager [phone] 03/02/2013 10:38 AM

## 2013-03-03 DIAGNOSIS — R7881 Bacteremia: Secondary | ICD-10-CM

## 2013-03-03 LAB — CULTURE, BLOOD (ROUTINE X 2)

## 2013-03-03 LAB — GLUCOSE, CAPILLARY
Glucose-Capillary: 110 mg/dL — ABNORMAL HIGH (ref 70–99)
Glucose-Capillary: 144 mg/dL — ABNORMAL HIGH (ref 70–99)

## 2013-03-03 LAB — WOUND CULTURE

## 2013-03-03 MED ORDER — INSULIN ASPART 100 UNIT/ML ~~LOC~~ SOLN: 0.0000 [IU] | Freq: Three times a day (TID) | SUBCUTANEOUS | Status: DC

## 2013-03-03 MED ORDER — PIPERACILLIN-TAZOBACTAM 3.375 G IVPB: 3.3750 g | Freq: Three times a day (TID) | INTRAVENOUS | Status: DC

## 2013-03-03 MED ORDER — GABAPENTIN 300 MG PO CAPS: 600.0000 mg | ORAL_CAPSULE | Freq: Three times a day (TID) | ORAL | Status: DC

## 2013-03-03 MED ORDER — INSULIN ASPART 100 UNIT/ML ~~LOC~~ SOLN: 0.0000 [IU] | Freq: Every day | SUBCUTANEOUS | Status: DC

## 2013-03-03 MED ORDER — VANCOMYCIN HCL IN DEXTROSE 1-5 GM/200ML-% IV SOLN: 1000.0000 mg | Freq: Two times a day (BID) | INTRAVENOUS | Status: DC

## 2013-03-03 MED ORDER — INSULIN GLARGINE 100 UNIT/ML ~~LOC~~ SOLN: 10.0000 [IU] | Freq: Every day | SUBCUTANEOUS | Status: DC

## 2013-03-03 MED ORDER — CEFAZOLIN SODIUM-DEXTROSE 2-3 GM-% IV SOLR
2.0000 g | Freq: Three times a day (TID) | INTRAVENOUS | Status: DC
  Administered 2013-03-03 – 2013-03-05 (×6): 2 g via INTRAVENOUS
  Filled 2013-03-03 (×8): qty 50

## 2013-03-03 MED ORDER — ENOXAPARIN SODIUM 40 MG/0.4ML ~~LOC~~ SOLN: 40.0000 mg | SUBCUTANEOUS | Status: DC

## 2013-03-03 MED ORDER — ACETAMINOPHEN 325 MG PO TABS: 650.0000 mg | ORAL_TABLET | Freq: Four times a day (QID) | ORAL | Status: DC | PRN

## 2013-03-03 MED ORDER — POTASSIUM CHLORIDE 20 MEQ/15ML (10%) PO LIQD: 20.0000 meq | Freq: Every day | ORAL | Status: DC

## 2013-03-03 NOTE — Progress Notes (Signed)
ANTIBIOTIC CONSULT NOTE - Follow Up  Pharmacy Consult for Cefazolin Indication: Diabetic foot ulcer/abscess  Allergies  Allergen Reactions  . Azithromycin Itching  . Macrodantin Itching  . Minocycline Hcl Itching  . Nitrofurantoin Itching  . Zithromax (Azithromycin Dihydrate) Itching  . Contrast Media (Iodinated Diagnostic Agents) Rash    Patient Measurements: Height: 6\' 1"  (185.4 cm) Weight: 194 lb 0.1 oz (88 kg) IBW/kg (Calculated) : 79.9 Adjusted Body Weight:   Vital Signs: Temp: 98.3 F (36.8 C) (03/29 0451) Temp src: Oral (03/29 0451) BP: 133/50 mmHg (03/29 0451) Pulse Rate: 64 (03/29 0451)  Labs:  Recent Labs  02/28/13 1725 03/01/13 0350  WBC 4.1 3.7*  HGB 10.9* 10.9*  PLT 79* 72*  CREATININE 0.99 1.00   Estimated Creatinine Clearance: 63.3 ml/min (by C-G formula based on Cr of 1).  Microbiology: Recent Results (from the past 720 hour(s))  CULTURE, BLOOD (ROUTINE X 2)     Status: None   Collection Time    02/28/13  5:25 PM      Result Value Range Status   Specimen Description BLOOD LEFT HAND   Final   Special Requests BOTTLES DRAWN AEROBIC AND ANAEROBIC   Final   Culture  Setup Time 03/01/2013 02:01   Final   Culture     Final   Value: STAPHYLOCOCCUS AUREUS     Note: RIFAMPIN AND GENTAMICIN SHOULD NOT BE USED AS SINGLE DRUGS FOR TREATMENT OF STAPH INFECTIONS.     Note: Gram Stain Report Called to,Read Back By and Verified With: REBECCA YOW ON 03/01/2013 AT 9:35P BY WILEJ   Report Status 03/03/2013 FINAL   Final   Organism ID, Bacteria STAPHYLOCOCCUS AUREUS   Final  CULTURE, BLOOD (ROUTINE X 2)     Status: None   Collection Time    02/28/13  5:30 PM      Result Value Range Status   Specimen Description BLOOD RIGHT ARM   Final   Special Requests BOTTLES DRAWN AEROBIC AND ANAEROBIC   Final   Culture  Setup Time 03/01/2013 02:02   Final   Culture     Final   Value: STAPHYLOCOCCUS AUREUS     Note: SUSCEPTIBILITIES PERFORMED ON PREVIOUS CULTURE  WITHIN THE LAST 5 DAYS.     Note: Gram Stain Report Called to,Read Back By and Verified With: CAROLINE MURRY 03/02/2013 5:05AM YIMSU   Report Status 03/03/2013 FINAL   Final  URINE CULTURE     Status: None   Collection Time    02/28/13  6:11 PM      Result Value Range Status   Specimen Description URINE, CLEAN CATCH   Final   Special Requests Normal   Final   Culture  Setup Time 03/01/2013 01:33   Final   Colony Count NO GROWTH   Final   Culture NO GROWTH   Final   Report Status 03/01/2013 FINAL   Final  WOUND CULTURE     Status: None   Collection Time    02/28/13  7:01 PM      Result Value Range Status   Specimen Description FOOT   Final   Special Requests Normal   Final   Gram Stain     Final   Value: NO WBC SEEN     NO SQUAMOUS EPITHELIAL CELLS SEEN     RARE GRAM POSITIVE COCCI     IN PAIRS   Culture     Final   Value: MODERATE STAPHYLOCOCCUS AUREUS  Note: RIFAMPIN AND GENTAMICIN SHOULD NOT BE USED AS SINGLE DRUGS FOR TREATMENT OF STAPH INFECTIONS.   Report Status 03/03/2013 FINAL   Final   Organism ID, Bacteria STAPHYLOCOCCUS AUREUS   Final  SURGICAL PCR SCREEN     Status: Abnormal   Collection Time    03/01/13  8:59 AM      Result Value Range Status   MRSA, PCR NEGATIVE  NEGATIVE Final   Staphylococcus aureus POSITIVE (*) NEGATIVE Final   Comment:            The Xpert SA Assay (FDA     approved for NASAL specimens     in patients over 65 years of age),     is one component of     a comprehensive surveillance     program.  Test performance has     been validated by [employer] for patients greater     than or equal to 75 year old.     It is not intended     to diagnose infection nor to     guide or monitor treatment.  TISSUE CULTURE     Status: None   Collection Time    03/01/13  5:15 PM      Result Value Range Status   Specimen Description FOOT LEFT DEEP TISSUE FROM ABSCESS.   Final   Special Requests NONE   Final   Gram Stain     Final   Value: NO  WBC SEEN     NO ORGANISMS SEEN   Culture     Final   Value: MODERATE STAPHYLOCOCCUS AUREUS     Note: RIFAMPIN AND GENTAMICIN SHOULD NOT BE USED AS SINGLE DRUGS FOR TREATMENT OF STAPH INFECTIONS.   Report Status PENDING   Incomplete    Medical History: Past Medical History  Diagnosis Date  . CAD (coronary artery disease)     a. s/p CABG 1995;  b. NSTEMI & subsequent BMS to SVG->right PDA 02/03/12.;  c. Cath 02/14/2012 3vd with 4/4 patent grafts and patent stent in vg->pda.  . Carotid stenosis     a. Carotid dopplers 2/13: RICA 40-59%, LICA 0-39%;  b. dopplers 3/14:  40-59% RICA, 0-39% LICA  . HTN (hypertension)   . HLD (hyperlipidemia)   . DM2 (diabetes mellitus, type 2)   . DJD (degenerative joint disease)   . Asthmatic bronchitis   . BPH (benign prostatic hypertrophy)   . Herpes zoster   . Anemia   . Thrombocytopenia     a. secondary to splenomegaly related to lymphoma with suspicion of bone marrow involvement. (Plavix had to be stopped due to this)  . Aortic stenosis, moderate     a. Echo 02/14/12 EF 55%, Gr 2 DD, Mod AS (mean grad: 29, peak grad 52)  . Splenomegaly 04/13/2012  . History of blood transfusion 04/13/2012  . 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 Dermatitis 06/07/2012    acnieform rash on back 06/06/12  . COPD (chronic obstructive pulmonary disease)   . Peripheral vascular disease     a. s/p L ray amp 10/13;  b. s/p L 2nd toe amp 12/13  . Pulmonary nodule     8mm RUL calcified pulm nodule noted on CT staging for lymphoma - stable 10/2012.  . Leg DVT (deep venous thromboembolism), chronic     LE dopplers 5/13, 10/13 and 1/14: chronic DVT involving right  mid femoral vein, left mid femoral vein, and left popliteal vein.  . Osteomyelitis     L first foot ray amputation 09/2012  . Gangrene     L 2nd toe amputation 11/2012    Medications:  Anti-infectives   Start     Dose/Rate Route Frequency  Ordered Stop   03/03/13 0000  piperacillin-tazobactam (ZOSYN) 3.375 GM/50ML IVPB     3.375 g 12.5 mL/hr over 240 Minutes Intravenous Every 8 hours 03/03/13 0821     03/03/13 0000  vancomycin (VANCOCIN) 1 GM/200ML SOLN     1,000 mg 200 mL/hr over 60 Minutes Intravenous Every 12 hours 03/03/13 0821     03/01/13 0800  vancomycin (VANCOCIN) IVPB 1000 mg/200 mL premix     1,000 mg 200 mL/hr over 60 Minutes Intravenous Every 12 hours 02/28/13 1931     03/01/13 0200  piperacillin-tazobactam (ZOSYN) IVPB 3.375 g     3.375 g 12.5 mL/hr over 240 Minutes Intravenous Every 8 hours 02/28/13 1931     03/01/13 0130  acyclovir (ZOVIRAX) tablet 400 mg     400 mg Oral 2 times daily 03/01/13 0103     02/28/13 1830  vancomycin (VANCOCIN) IVPB 1000 mg/200 mL premix     1,000 mg 200 mL/hr over 60 Minutes Intravenous  Once 02/28/13 1824 02/28/13 2219   02/28/13 1830  piperacillin-tazobactam (ZOSYN) IVPB 3.375 g     3.375 g 100 mL/hr over 30 Minutes Intravenous  Once 02/28/13 1824 02/28/13 2018     Assessment: 85 yom present to ED 3/26 with N/V and flu-like symptoms x3 days.  He has a diabetic foot ulcer and a history of amputation of left second toe d/t gangrene 11/2012 and NHL with last chemotherapy on 01/30/13.  Pharmacy asked to assist with Vancomycin and Zosyn dosing for foot infection. ID contacted per policy for S. Aureus bacteremia, orders to change current abx to cefazolin  3/26 >> Vanc >> 3/29 3/26 >> Zosyn >> 3/29 3/29 >> cefazolin >>  Tmax: afeb WBCs: 3.7 Renal: Scr stable, CrCl 63 CG  3/26 blood: MSSA 2/2 3/26 urine: NGF  3/26 wound: MSSA  Dose changes/drug level info:  3/28: VT @ 1900 = 14.2, no change  Goal of Therapy:  Vancomycin trough level 15-20 mcg/ml  Plan:   Cefazolin 2gm IV q8h  ID has ordered repeat BC and TTE  Juliette Alcide, PharmD, BCPS.   Pager: [phone] 03/03/2013 12:02 PM

## 2013-03-03 NOTE — Discharge Summary (Addendum)
DISCHARGE SUMMARY  [name]  MR#: [name]:1927-11-15  Date of Admission: 02/28/2013 Date of Discharge: 03/03/2013  Attending Physician:Emmons Toth W  Patient's WUJ:WJXBJYNW,GNFAOZ G, MD  Consults:  Gaylord ShihVictorino [name]  Discharge Diagnoses: Principal Problem:   Diabetic foot ulcer associated with type 2 diabetes mellitus and ray amputation. Active Problems:   Anemia   Peripheral arterial disease   Fever and chills   Type II or unspecified type diabetes mellitus with neurological manifestations, not stated as uncontrolled(250.60)   Mantle cell lymphoma   Carotid artery disease   Essential hypertension, benign   Aortic stenosis, moderate   CAD (coronary artery disease)   Hyponatremia   Discharge Medications:   Medication List    TAKE these medications       acetaminophen 325 MG tablet  Commonly known as:  TYLENOL  Take 2 tablets (650 mg total) by mouth every 6 (six) hours as needed.     acyclovir 400 MG tablet  Commonly known as:  ZOVIRAX  Take 400 mg by mouth 2 (two) times daily.     aspirin EC 81 MG tablet  Take 81 mg by mouth every morning.     enoxaparin 40 MG/0.4ML injection  Commonly known as:  LOVENOX  Inject 0.4 mLs (40 mg total) into the skin daily.     gabapentin 300 MG capsule  Commonly known as:  NEURONTIN  Take 2 capsules (600 mg total) by mouth 3 (three) times daily.     insulin aspart 100 UNIT/ML injection  Commonly known as:  novoLOG  Inject 0-15 Units into the skin 3 (three) times daily with meals.     insulin aspart 100 UNIT/ML injection  Commonly known as:  novoLOG  Inject 0-5 Units into the skin at bedtime.     insulin glargine 100 UNIT/ML injection  Commonly known as:  LANTUS  Inject 0.1 mLs (10 Units total) into the skin at bedtime.     isosorbide mononitrate 30 MG 24 hr tablet  Commonly known as:  IMDUR  Take 1 tablet (30 mg total) by mouth daily.     losartan 100 MG tablet  Commonly known as:  COZAAR  Take 1  tablet by mouth daily.     meloxicam 15 MG tablet  Commonly known as:  MOBIC  Take 15 mg by mouth daily.     metFORMIN 500 MG tablet  Commonly known as:  GLUCOPHAGE  Take 1,000 mg by mouth daily.     metoprolol succinate 25 MG 24 hr tablet  Commonly known as:  TOPROL-XL  Take 25 mg by mouth daily.     ondansetron 8 MG disintegrating tablet  Commonly known as:  ZOFRAN-ODT  Place 8 mg under the tongue every 8 (eight) hours as needed. For nausea.     piperacillin-tazobactam 3.375 GM/50ML IVPB  Commonly known as:  ZOSYN  Inject 50 mLs (3.375 g total) into the vein every 8 (eight) hours.     potassium chloride 20 MEQ/15ML (10%) solution  Take 15 mLs (20 mEq total) by mouth daily.     pravastatin 40 MG tablet  Commonly known as:  PRAVACHOL  Take 40 mg by mouth daily.     vancomycin 1 GM/200ML Soln  Commonly known as:  VANCOCIN  Inject 200 mLs (1,000 mg total) into the vein every 12 (twelve) hours.     vitamin B-12 500 MCG tablet  Commonly known as:  CYANOCOBALAMIN  Take 500 mcg by mouth daily.  Hospital Procedures: Ct Abdomen Pelvis Wo Contrast  02/05/2013  *RADIOLOGY REPORT*  Clinical Data:  Follow up mantle cell lymphoma  CT CHEST, ABDOMEN AND PELVIS WITHOUT CONTRAST  Technique:  Multidetector CT imaging of the chest, abdomen and pelvis was performed following the standard protocol without IV contrast.  Comparison:  02/05/2013  CT CHEST  Findings:  Lungs/pleura: There is no pleural effusion identified. Mild basilar predominant interstitial reticulation is identified. 7 mm nodule in the superior segment of right lower lobe is stable, image 30/series 4.  Within the right apex there is an 8 mm irregular density, image 13/series 4. This is unchanged from previous exam.  Pulmonary nodule within the left lower lobe is unchanged measuring 4 mm, image 32.  This is stable from previous exam.  Heart/Mediastinum: Normal heart size.  No pericardial effusion. Status post CABG.  There is  no mediastinal or hilar adenopathy.  No pericardial or pleural effusion.  Bones/Musculoskeletal:  No axillary or supraclavicular adenopathy noted. There are no aggressive lytic or sclerotic bone lesions identified.  IMPRESSION:  1.  No acute cardiopulmonary abnormalities. 2.  No mass or adenopathy. 3.  Small pulmonary nodules are unchanged from previous exam.  CT ABDOMEN AND PELVIS  Findings:  Calcified granuloma noted within the dome of liver.  No focal liver abnormalities identified.  The gallbladder appears normal.  No biliary dilatation.  The pancreas is unremarkable.  The spleen measures 15 cm in craniocaudal dimension.  This is compared with 16 cm previously.  No focal splenic lesion identified. Normal appearance of the adrenal glands.  Small nonobstructing calculus is noted in the inferior pole the right kidney measuring approximately 2 mm.  Nonobstructing stone within the inferior pole of the left kidney measures 6 mm, image 64/series 2.  The urinary bladder is within normal limits.  Prostate gland and seminal vesicles are unremarkable.  Normal caliber of the abdominal aorta. Gastrohepatic ligament lymph node measures 1 cm, image 66/series 2.  Previously 1.1 cm.  No pelvic or inguinal adenopathy. The stomach is normal.  Small bowel loops are unremarkable.  The appendix is visualized and appears normal.  Normal caliber of the colon.  Peritoneal nodule within the upper abdomen measures 2.30 cm, image 60/series 2.  Previously this measured 1.9 cm  Review of the visualized osseous structures is negative for lytic or sclerotic bone change.  There is multilevel spondylosis identified within the lumbar spine.  IMPRESSION:  1.  No acute findings. 2.  Slight increase in size of the peritoneal nodule within the upper abdomen. Stable gastrohepatic ligament lymph node.  3.  Stable splenomegaly.   Original Report Authenticated By: Signa Kell, M.D.    Ct Chest Wo Contrast  02/05/2013  *RADIOLOGY REPORT*  Clinical  Data:  Follow up mantle cell lymphoma  CT CHEST, ABDOMEN AND PELVIS WITHOUT CONTRAST  Technique:  Multidetector CT imaging of the chest, abdomen and pelvis was performed following the standard protocol without IV contrast.  Comparison:  02/05/2013  CT CHEST  Findings:  Lungs/pleura: There is no pleural effusion identified. Mild basilar predominant interstitial reticulation is identified. 7 mm nodule in the superior segment of right lower lobe is stable, image 30/series 4.  Within the right apex there is an 8 mm irregular density, image 13/series 4. This is unchanged from previous exam.  Pulmonary nodule within the left lower lobe is unchanged measuring 4 mm, image 32.  This is stable from previous exam.  Heart/Mediastinum: Normal heart size.  No pericardial effusion. Status post  CABG.  There is no mediastinal or hilar adenopathy.  No pericardial or pleural effusion.  Bones/Musculoskeletal:  No axillary or supraclavicular adenopathy noted. There are no aggressive lytic or sclerotic bone lesions identified.  IMPRESSION:  1.  No acute cardiopulmonary abnormalities. 2.  No mass or adenopathy. 3.  Small pulmonary nodules are unchanged from previous exam.  CT ABDOMEN AND PELVIS  Findings:  Calcified granuloma noted within the dome of liver.  No focal liver abnormalities identified.  The gallbladder appears normal.  No biliary dilatation.  The pancreas is unremarkable.  The spleen measures 15 cm in craniocaudal dimension.  This is compared with 16 cm previously.  No focal splenic lesion identified. Normal appearance of the adrenal glands.  Small nonobstructing calculus is noted in the inferior pole the right kidney measuring approximately 2 mm.  Nonobstructing stone within the inferior pole of the left kidney measures 6 mm, image 64/series 2.  The urinary bladder is within normal limits.  Prostate gland and seminal vesicles are unremarkable.  Normal caliber of the abdominal aorta. Gastrohepatic ligament lymph node measures  1 cm, image 66/series 2.  Previously 1.1 cm.  No pelvic or inguinal adenopathy. The stomach is normal.  Small bowel loops are unremarkable.  The appendix is visualized and appears normal.  Normal caliber of the colon.  Peritoneal nodule within the upper abdomen measures 2.30 cm, image 60/series 2.  Previously this measured 1.9 cm  Review of the visualized osseous structures is negative for lytic or sclerotic bone change.  There is multilevel spondylosis identified within the lumbar spine.  IMPRESSION:  1.  No acute findings. 2.  Slight increase in size of the peritoneal nodule within the upper abdomen. Stable gastrohepatic ligament lymph node.  3.  Stable splenomegaly.   Original Report Authenticated By: Signa Kell, M.D.    Dg Chest Portable 1 View  02/28/2013  *RADIOLOGY REPORT*  Clinical Data: Chest pain and cough.  Vomiting.  PORTABLE CHEST - 1 VIEW  Comparison: PA and lateral chest 09/07/2012 and CT chest 02/05/2013.  Findings: The patient is status post CABG.  Heart size is mildly enlarged.  Lungs are clear.  No pneumothorax or pleural fluid. Postoperative change left shoulder is noted.  IMPRESSION: No acute abnormality.   Original Report Authenticated By: Holley Dexter, M.D.    PHYSICIAN: [name], MD DATE OF BIRTH: 07-21-1927  DATE OF PROCEDURE: 03/01/2013  DATE OF DISCHARGE:  OPERATIVE REPORT  PREOPERATIVE DIAGNOSIS: Left plantar foot ulcer and cellulitis.  POSTOPERATIVE DIAGNOSIS: Left plantar foot ulcer with abscess and  cellulitis.  PROCEDURE:  1. Irrigation and debridement of left foot abscess including multiple  areas  2. Revision left first ray amputation.  SURGEON: [name], MD  ANESTHESIA: General, regional.  ESTIMATED BLOOD LOSS: Minimal.  TOURNIQUET TIME: 17 minutes with an ankle Esmarch.  COMPLICATIONS: None apparent.  DISPOSITION: Extubated awake and stable to recovery.  INDICATIONS FOR PROCEDURE: The patient is an 79 year old male, who was  admitted yesterday  for his left foot ulcer and drainage. He was also  noted to have constitutional symptoms associated with this left foot  cellulitis and draining wound. He presents now for irrigation and  debridement of this wound. He is status post left first ray amputation  in the past for a similar ulcer. He and his wife understand the risks  and benefits, the alternative treatment options and elects surgical  treatment. They specifically understand risks of bleeding, infection,  nerve damage, blood clots, need for additional surgery, revision  amputation, and death.  PROCEDURE IN DETAIL: After preoperative consent was obtained and the  correct operative site was identified, the patient was brought to the  operating room and placed supine on the operating table. General  anesthesia was induced. Preoperative antibiotics were administered.  Surgical time-out was taken. The left lower extremity was prepped and  draped in standard sterile fashion. The foot was exsanguinated and a 4  inch Esmarch tourniquet was wrapped around the ankle. The patient's  previous surgical incision was identified dorsal to the plantar ulcer.  The incision was made and sharp dissection was carried down through the  skin. Immediately evident was an abscess distal to the remaining first  metatarsal. Specimen of deep tissue was obtained and sent to  Microbiology for aerobic and anaerobic culture. Given the proximity of  this abscess over the distal end of the first metatarsal, there was  concern for residual osteomyelitis of the bone. Since the remaining ray  was not functional, the decision was made to proceed with revision first  ray amputation. The first metatarsal was then shelled out  subperiosteally and passed off the field. The remaining area was  debrided sharply with a scalpel from the level of the skin down to the  level of the bone removing all necrotic and nonvital tissue. All  purulent material was removed as well. The  ulcer area was cored out to  healthy surrounding tissue. The wound was then irrigated with 3 L of  normal saline. The tourniquet was released. Hemostasis was achieved.  Vertical mattress sutures of 2-0 nylon were used to close the skin  incision. Sterile dressings were applied followed by compression wrap.  The patient was awakened from anesthesia and transported to the recovery  room in stable condition.  FOLLOWUP PLAN: The patient will continue on his IV vancomycin and Zosyn  pending the results of his wound cultures. He may need a course of IV  antibiotics at home.  [name], MD    History of Present Illness: Patient is an 79 year old admitted with vomiting, chills, evidence of diabetic complicatiobns with L foot cellulitis and ulcer  Hospital Course: Patient had been seen by [facility] for foot infection and was sent for admission, started on Vanc and Zosyn with plan for ray amputation.  He was hospitalized 7 times in the past year and had 2 foot procedures with toe amputations. At last visit in our office in FEB he had well controlled DM and was otherwise doing well. He has PVD and insensate feet.  Underwent procedure on 3/27 and tolerated it well, with some neuropathic pain.  Plan is for 2 more weeks of IV antibiotics to be administered at [facility] with Ortho f/u in 1 week from now.  He and his wife are understanding of this and accepting of short term placement.  His wife will be in fact seeing Eloise Harman at the office on Monday.   Day of Discharge Exam BP 133/50  Pulse 64  Temp(Src) 98.3 F (36.8 C) (Oral)  Resp 18  Ht 6\' 1"  (1.854 m)  Wt 88 kg (194 lb 0.1 oz)  BMI 25.6 kg/m2  SpO2 95%  Physical Exam: General appearance: alert, cooperative and appears stated age Eyes: no scleral icterus Throat: oropharynx moist without erythema Resp: clear to auscultation bilaterally Cardio: regular rate and rhythm, S1, S2 normal, no murmur, click, rub or gallop GI: soft, non-tender; bowel  sounds normal; no masses,  no organomegaly Extremities: area s/p amputation cleanly dressed Neuro:  Alert and awake, some sharp pain c/w neuropatjhic.  Discharge Labs:  Recent Labs  02/28/13 1725 03/01/13 0350  NA 127* 130*  K 3.8 3.9  CL 92* 95*  CO2 21 22  GLUCOSE 151* 134*  BUN 22 19  CREATININE 0.99 1.00  CALCIUM 9.2 9.0    Recent Labs  02/28/13 1725 03/01/13 0350  AST 20 19  ALT 12 12  ALKPHOS 129* 125*  BILITOT 1.1 1.0  PROT 5.9* 5.6*  ALBUMIN 3.6 3.3*    Recent Labs  02/28/13 1725 03/01/13 0350  WBC 4.1 3.7*  NEUTROABS 2.7  --   HGB 10.9* 10.9*  HCT 31.2* 30.4*  MCV 99.7 99.0  PLT 79* 72*   Lab Results  Component Value Date   INR 1.09 09/08/2012   INR 1.05 03/29/2012   INR 1.13 02/14/2012    Discharge instructions:      Future Appointments Provider Department Dept Phone   03/27/2013 8:45 AM [facility] MEDICAL ONCOLOGY [phone]   03/27/2013 9:15 AM Rana Snare, NP [facility] MEDICAL ONCOLOGY [phone]   03/27/2013 10:15 AM [facility] MED[facility] [phone]   05/29/2013 10:00 AM Beverely Pace [facility] [phone]   05/29/2013 10:30 AM [facility] [phone]   07/31/2013 10:00 AM [facility] Roch  [facility] [phone]      Disposition:[facility] Follow-up Appts: Dr. Eloise Harman, his regular primary care MD, will be the accepting MD at [facility] and will arrange.  Will arrange f/u with [facility] in 1 week from facility as well. Condition on Discharge: Stable Signed:  Addendum: Despite his PCP and the accepting MD being the same, the facility cannot take him until Monday.  Have cancelled his discharge and will use this as his progress note for today.  Will plan on Monday. Anora Schwenke W 03/03/2013, 8:21 AM

## 2013-03-03 NOTE — Progress Notes (Addendum)
Per MD, Pt ready for d/c.  Contacted [facility].  Per April, supervisor, at [facility], admission wasn't pre-arranged, so facility cannot admit over the weekend.  She confirmed this with Revonda Standard, [facility] CSW.  Notified RN.  RN to notify family and MD.  Mosie Epstein CSW to follow.  Providence Crosby, LCSWA Clinical Social Work [phone]

## 2013-03-03 NOTE — Consult Note (Signed)
[facility]     Reason for Consult: Staph aureus bacteremia    Referring Physician: Staph aureus bacteremia protocol/Dr. Wylene Simmer  Principal Problem:   Diabetic foot ulcer associated with type 2 diabetes mellitus Active Problems:   Anemia   Peripheral arterial disease   Fever and chills   Type II or unspecified type diabetes mellitus with neurological manifestations, not stated as uncontrolled(250.60)   Mantle cell lymphoma   Carotid artery disease   Essential hypertension, benign   Aortic stenosis, moderate   CAD (coronary artery disease)   Hyponatremia   . acyclovir  400 mg Oral BID  . aspirin EC  81 mg Oral Daily  .  ceFAZolin (ANCEF) IV  2 g Intravenous Q8H  . chlorhexidine  60 mL Topical Once  . enoxaparin (LOVENOX) injection  40 mg Subcutaneous Q24H  . gabapentin  300 mg Oral TID  . gabapentin  600 mg Oral Custom  . insulin aspart  0-15 Units Subcutaneous TID WC  . insulin aspart  0-5 Units Subcutaneous QHS  . insulin glargine  10 Units Subcutaneous QHS  . isosorbide mononitrate  30 mg Oral Daily  . losartan  100 mg Oral Daily  . metoprolol succinate  25 mg Oral Daily  . potassium chloride  20 mEq Oral Daily  . simvastatin  10 mg Oral q1800  . vitamin B-12  500 mcg Oral Daily    Recommendations: D/C vancomycin and Zosyn Start Cefazolin, he will need 4-6 weeks of IV therapy due to bacteremia in the setting of chemotherapy from 1st negative blood culture Check TTE for ? endocarditis Repeat blood cultures to assure clearance TEE if any concerns on TTE   Assessment: He has a diabetic foot ulcer and now s/p revision first left ray amputation.  His abscess culture and blood culture, 2/2, are growing Staph aureus, MSSA.     Antibiotics: Vancomycin, Zosyn 4 days  HPI: [name] is a 79 y.o. male with mantle cell lymphoma on monthly chemo with a history of left first ray partial amputation who presented on 3/26 with N/V, chills and  noted in orthopedics office to have bloody pus from the foot wound.  He required revision amputation of ray and debridement of abscess and cultures in blood and wound, tissue, have grown MSSA.  The patient still feels a little weak but no fever or chills now.  Hgb A1C 6.2.  He has had the left second toe amputated as well and multiple procedures.     Review of Systems: Pertinent items are noted in HPI.  Past Medical History  Diagnosis Date  . CAD (coronary artery disease)     a. s/p CABG 1995;  b. NSTEMI & subsequent BMS to SVG->right PDA 02/03/12.;  c. Cath 02/14/2012 3vd with 4/4 patent grafts and patent stent in vg->pda.  . Carotid stenosis     a. Carotid dopplers 2/13: RICA 40-59%, LICA 0-39%;  b. dopplers 3/14:  40-59% RICA, 0-39% LICA  . HTN (hypertension)   . HLD (hyperlipidemia)   . DM2 (diabetes mellitus, type 2)   . DJD (degenerative joint disease)   . Asthmatic bronchitis   . BPH (benign prostatic hypertrophy)   . Herpes zoster   . Anemia   . Thrombocytopenia     a. secondary to splenomegaly related to lymphoma with suspicion of bone marrow involvement. (Plavix had to be stopped due to this)  . Aortic stenosis, moderate     a. Echo 02/14/12  EF 55%, Gr 2 DD, Mod AS (mean grad: 29, peak grad 52)  . Splenomegaly 04/13/2012  . History of blood transfusion 04/13/2012  . 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 Dermatitis 06/07/2012    acnieform rash on back 06/06/12  . COPD (chronic obstructive pulmonary disease)   . Peripheral vascular disease     a. s/p L ray amp 10/13;  b. s/p L 2nd toe amp 12/13  . Pulmonary nodule     8mm RUL calcified pulm nodule noted on CT staging for lymphoma - stable 10/2012.  . Leg DVT (deep venous thromboembolism), chronic     LE dopplers 5/13, 10/13 and 1/14: chronic DVT involving right mid femoral vein, left mid femoral vein, and left popliteal vein.  . Osteomyelitis     L first foot  ray amputation 09/2012  . Gangrene     L 2nd toe amputation 11/2012    History  Substance Use Topics  . Smoking status: Former Smoker -- 1.00 packs/day for 30 years    Types: Cigarettes, Pipe    Quit date: 03/29/1985  . Smokeless tobacco: Never Used     Comment: quit in 1986  . Alcohol Use: No    Family History  Problem Relation Age of Onset  . Coronary artery disease    . Alcohol abuse     Allergies  Allergen Reactions  . Azithromycin Itching  . Macrodantin Itching  . Minocycline Hcl Itching  . Nitrofurantoin Itching  . Zithromax (Azithromycin Dihydrate) Itching  . Contrast Media (Iodinated Diagnostic Agents) Rash    OBJECTIVE: Blood pressure 133/50, pulse 64, temperature 98.3 F (36.8 C), temperature source Oral, resp. rate 18, height 6\' 1"  (1.854 m), weight 194 lb 0.1 oz (88 kg), SpO2 95.00%. General: Awake, alert nad Skin: no rashes Lungs: CTA B Cor: RRR without m/r/g Abdomen: soft, nt, nd, +bs, no HSM Ext: left foot wrapped  Microbiology: Recent Results (from the past 240 hour(s))  CULTURE, BLOOD (ROUTINE X 2)     Status: None   Collection Time    02/28/13  5:25 PM      Result Value Range Status   Specimen Description BLOOD LEFT HAND   Final   Special Requests BOTTLES DRAWN AEROBIC AND ANAEROBIC   Final   Culture  Setup Time 03/01/2013 02:01   Final   Culture     Final   Value: STAPHYLOCOCCUS AUREUS     Note: RIFAMPIN AND GENTAMICIN SHOULD NOT BE USED AS SINGLE DRUGS FOR TREATMENT OF STAPH INFECTIONS.     Note: Gram Stain Report Called to,Read Back By and Verified With: REBECCA YOW ON 03/01/2013 AT 9:35P BY WILEJ   Report Status 03/03/2013 FINAL   Final   Organism ID, Bacteria STAPHYLOCOCCUS AUREUS   Final  CULTURE, BLOOD (ROUTINE X 2)     Status: None   Collection Time    02/28/13  5:30 PM      Result Value Range Status   Specimen Description BLOOD RIGHT ARM   Final   Special Requests BOTTLES DRAWN AEROBIC AND ANAEROBIC   Final   Culture   Setup Time 03/01/2013 02:02   Final   Culture     Final   Value: STAPHYLOCOCCUS AUREUS     Note: SUSCEPTIBILITIES PERFORMED ON PREVIOUS CULTURE WITHIN THE LAST 5 DAYS.     Note: Gram Stain Report Called to,Read Back By and Verified With: Weldon Picking  03/02/2013 5:05AM YIMSU   Report Status 03/03/2013 FINAL   Final  URINE CULTURE     Status: None   Collection Time    02/28/13  6:11 PM      Result Value Range Status   Specimen Description URINE, CLEAN CATCH   Final   Special Requests Normal   Final   Culture  Setup Time 03/01/2013 01:33   Final   Colony Count NO GROWTH   Final   Culture NO GROWTH   Final   Report Status 03/01/2013 FINAL   Final  WOUND CULTURE     Status: None   Collection Time    02/28/13  7:01 PM      Result Value Range Status   Specimen Description FOOT   Final   Special Requests Normal   Final   Gram Stain     Final   Value: NO WBC SEEN     NO SQUAMOUS EPITHELIAL CELLS SEEN     RARE GRAM POSITIVE COCCI     IN PAIRS   Culture     Final   Value: MODERATE STAPHYLOCOCCUS AUREUS     Note: RIFAMPIN AND GENTAMICIN SHOULD NOT BE USED AS SINGLE DRUGS FOR TREATMENT OF STAPH INFECTIONS.   Report Status 03/03/2013 FINAL   Final   Organism ID, Bacteria STAPHYLOCOCCUS AUREUS   Final  SURGICAL PCR SCREEN     Status: Abnormal   Collection Time    03/01/13  8:59 AM      Result Value Range Status   MRSA, PCR NEGATIVE  NEGATIVE Final   Staphylococcus aureus POSITIVE (*) NEGATIVE Final   Comment:            The Xpert SA Assay (FDA     approved for NASAL specimens     in patients over 40 years of age),     is one component of     a comprehensive surveillance     program.  Test performance has     been validated by [employer] for patients greater     than or equal to 81 year old.     It is not intended     to diagnose infection nor to     guide or monitor treatment.  TISSUE CULTURE     Status: None   Collection Time    03/01/13  5:15 PM      Result Value Range  Status   Specimen Description FOOT LEFT DEEP TISSUE FROM ABSCESS.   Final   Special Requests NONE   Final   Gram Stain     Final   Value: NO WBC SEEN     NO ORGANISMS SEEN   Culture     Final   Value: MODERATE STAPHYLOCOCCUS AUREUS     Note: RIFAMPIN AND GENTAMICIN SHOULD NOT BE USED AS SINGLE DRUGS FOR TREATMENT OF STAPH INFECTIONS.   Report Status PENDING   Incomplete    Staci Righter, MD [facility] [phone] pager  [phone] cell 03/03/2013, 2:21 PM

## 2013-03-04 DIAGNOSIS — I359 Nonrheumatic aortic valve disorder, unspecified: Secondary | ICD-10-CM

## 2013-03-04 LAB — GLUCOSE, CAPILLARY
Glucose-Capillary: 134 mg/dL — ABNORMAL HIGH (ref 70–99)
Glucose-Capillary: 144 mg/dL — ABNORMAL HIGH (ref 70–99)
Glucose-Capillary: 184 mg/dL — ABNORMAL HIGH (ref 70–99)

## 2013-03-04 LAB — TISSUE CULTURE: Gram Stain: NONE SEEN

## 2013-03-04 NOTE — Progress Notes (Signed)
  Echocardiogram 2D Echocardiogram has been performed.  [name] 03/04/2013, 10:11 AM

## 2013-03-04 NOTE — Progress Notes (Signed)
Subjective: Doing ok this AM.  Explained delay in transfer to [facility] (pharmacy issues).  He feels ok otherwise, just a bit bored.  Objective: Vital signs in last 24 hours: Temp:  [97.6 F (36.4 C)-98.8 F (37.1 C)] 97.6 F (36.4 C) (03/30 0414) Pulse Rate:  [60-82] 75 (03/30 0414) Resp:  [18-19] 19 (03/30 0414) BP: (111-149)/(42-87) 149/59 mmHg (03/30 0414) SpO2:  [92 %-95 %] 95 % (03/30 0414) Weight change:  Last BM Date: 03/04/13  Intake/Output from previous day: 03/29 0701 - 03/30 0700 In: 4551.4 [P.O.:1420; I.V.:2781.4; IV Piggyback:350] Out: 3550 [Urine:3550] Intake/Output this shift:   General appearance: alert, cooperative and appears stated age  Eyes: no scleral icterus  Throat: oropharynx moist without erythema  Resp: clear to auscultation bilaterally  Cardio: regular rate and rhythm, S1, S2 normal, no murmur, click, rub or gallop  GI: soft, non-tender; bowel sounds normal; no masses, no organomegaly  Extremities: area s/p amputation cleanly dressed  Neuro: Alert and awake, some sharp pain c/w neuropatjhic.  Lab Results: No results found for this basename: WBC, HGB, HCT, PLT,  in the last 72 hours BMET No results found for this basename: NA, K, CL, CO2, GLUCOSE, BUN, CREATININE, CALCIUM,  in the last 72 hours  Studies/Results: No results found.  Medications:  I have reviewed the patient's current medications. Scheduled: . acyclovir  400 mg Oral BID  . aspirin EC  81 mg Oral Daily  .  ceFAZolin (ANCEF) IV  2 g Intravenous Q8H  . chlorhexidine  60 mL Topical Once  . enoxaparin (LOVENOX) injection  40 mg Subcutaneous Q24H  . gabapentin  300 mg Oral TID  . gabapentin  600 mg Oral Custom  . insulin aspart  0-15 Units Subcutaneous TID WC  . insulin aspart  0-5 Units Subcutaneous QHS  . insulin glargine  10 Units Subcutaneous QHS  . isosorbide mononitrate  30 mg Oral Daily  . losartan  100 mg Oral Daily  . metoprolol succinate  25 mg Oral Daily  . potassium  chloride  20 mEq Oral Daily  . simvastatin  10 mg Oral q1800  . vitamin B-12  500 mcg Oral Daily   Continuous: . sodium chloride 1,000 mL (03/02/13 2332)  . sodium chloride 100 mL/hr at 03/04/13 0021   ZOX:[id number], ondansetron, ondansetron, oxyCODONE  Assessment/Plan: MSSA cellulitis and now sepsis.  ID consult obtained.  Now on IV Cefazolin.  Echo pending to look for vegetation.  Further BC's drawn.  If negative will arrange PICC for tomorrow morning. DM2- No changes, controlled Anemia- Stable PAD- Stable Mantle Cell Lymphoma- Per Onc. CAD no current issues, continue current meds.  LOS: 4 days   Aigner Horseman W 03/04/2013, 8:39 AM

## 2013-03-04 NOTE — Progress Notes (Signed)
Subjective: 3 Days Post-Op Procedure(s) (LRB): IRRIGATION AND DEBRIDEMENT EXTREMITY (Left) Patient reports pain as mild.   Patient seen in rounds with Dr. Darrelyn [name]. Patient is well, and has had no acute complaints or problems. Voiding well. No complaints of chest pain or shortness of breath. Up eating breakfast during rounds.  Plan is to go Home after hospital stay.  Objective: Vital signs in last 24 hours: Temp:  [97.6 F (36.4 C)-98.8 F (37.1 C)] 97.6 F (36.4 C) (03/30 0414) Pulse Rate:  [60-82] 75 (03/30 0414) Resp:  [18-19] 19 (03/30 0414) BP: (111-149)/(42-87) 149/59 mmHg (03/30 0414) SpO2:  [92 %-95 %] 95 % (03/30 0414)  Intake/Output from previous day:  Intake/Output Summary (Last 24 hours) at 03/04/13 0742 Last data filed at 03/04/13 0548  Gross per 24 hour  Intake 4551.42 ml  Output   3550 ml  Net 1001.42 ml      EXAM General - Patient is Alert and Oriented Extremity - Compartment soft Dressing - clean, dry   Past Medical History  Diagnosis Date  . CAD (coronary artery disease)     a. s/p CABG 1995;  b. NSTEMI & subsequent BMS to SVG->right PDA 02/03/12.;  c. Cath 02/14/2012 3vd with 4/4 patent grafts and patent stent in vg->pda.  . Carotid stenosis     a. Carotid dopplers 2/13: RICA 40-59%, LICA 0-39%;  b. dopplers 3/14:  40-59% RICA, 0-39% LICA  . HTN (hypertension)   . HLD (hyperlipidemia)   . DM2 (diabetes mellitus, type 2)   . DJD (degenerative joint disease)   . Asthmatic bronchitis   . BPH (benign prostatic hypertrophy)   . Herpes zoster   . Anemia   . Thrombocytopenia     a. secondary to splenomegaly related to lymphoma with suspicion of bone marrow involvement. (Plavix had to be stopped due to this)  . Aortic stenosis, moderate     a. Echo 02/14/12 EF 55%, Gr 2 DD, Mod AS (mean grad: 29, peak grad 52)  . Splenomegaly 04/13/2012  . History of blood transfusion 04/13/2012  . 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 Dermatitis 06/07/2012    acnieform rash on back 06/06/12  . COPD (chronic obstructive pulmonary disease)   . Peripheral vascular disease     a. s/p L ray amp 10/13;  b. s/p L 2nd toe amp 12/13  . Pulmonary nodule     8mm RUL calcified pulm nodule noted on CT staging for lymphoma - stable 10/2012.  . Leg DVT (deep venous thromboembolism), chronic     LE dopplers 5/13, 10/13 and 1/14: chronic DVT involving right mid femoral vein, left mid femoral vein, and left popliteal vein.  . Osteomyelitis     L first foot ray amputation 09/2012  . Gangrene     L 2nd toe amputation 11/2012    Assessment/Plan: 3 Days Post-Op Procedure(s) (LRB): IRRIGATION AND DEBRIDEMENT EXTREMITY (Left) Principal Problem:   Diabetic foot ulcer associated with type 2 diabetes mellitus Active Problems:   Anemia   Peripheral arterial disease   Fever and chills   Type II or unspecified type diabetes mellitus with neurological manifestations, not stated as uncontrolled(250.60)   Mantle cell lymphoma   Carotid artery disease   Essential hypertension, benign   Aortic stenosis, moderate   CAD (coronary artery disease)   Hyponatremia   Staphylococcus aureus bacteremia  Estimated body mass index is 25.6 kg/(m^2) as calculated from  the following:   Height as of this encounter: 6\' 1"  (1.854 m).   Weight as of this encounter: 88 kg (194 lb 0.1 oz). Advance diet Plan for discharge tomorrow  Patient doing well today. Will monitor through the day. Dr. Victorino [name] to resume care tomorrow with possible discharge.   [name] [name] 03/04/2013, 7:42 AM

## 2013-03-05 ENCOUNTER — Telehealth: Payer: Self-pay | Admitting: *Deleted

## 2013-03-05 DIAGNOSIS — L97509 Non-pressure chronic ulcer of other part of unspecified foot with unspecified severity: Secondary | ICD-10-CM

## 2013-03-05 DIAGNOSIS — E1169 Type 2 diabetes mellitus with other specified complication: Principal | ICD-10-CM

## 2013-03-05 DIAGNOSIS — R509 Fever, unspecified: Secondary | ICD-10-CM

## 2013-03-05 DIAGNOSIS — R7881 Bacteremia: Secondary | ICD-10-CM

## 2013-03-05 DIAGNOSIS — A4901 Methicillin susceptible Staphylococcus aureus infection, unspecified site: Secondary | ICD-10-CM

## 2013-03-05 DIAGNOSIS — D61818 Other pancytopenia: Secondary | ICD-10-CM | POA: Diagnosis not present

## 2013-03-05 LAB — CBC WITH DIFFERENTIAL/PLATELET
Band Neutrophils: 5 % (ref 0–10)
Basophils Absolute: 0 10*3/uL (ref 0.0–0.1)
Basophils Relative: 0 % (ref 0–1)
Eosinophils Relative: 7 % — ABNORMAL HIGH (ref 0–5)
HCT: 27.6 % — ABNORMAL LOW (ref 39.0–52.0)
Hemoglobin: 9.6 g/dL — ABNORMAL LOW (ref 13.0–17.0)
Lymphs Abs: 0.5 10*3/uL — ABNORMAL LOW (ref 0.7–4.0)
MCH: 34.9 pg — ABNORMAL HIGH (ref 26.0–34.0)
MCV: 100.4 fL — ABNORMAL HIGH (ref 78.0–100.0)
Monocytes Relative: 16 % — ABNORMAL HIGH (ref 3–12)
Neutro Abs: 0.9 10*3/uL — ABNORMAL LOW (ref 1.7–7.7)
Platelets: 93 10*3/uL — ABNORMAL LOW (ref 150–400)
RBC: 2.75 MIL/uL — ABNORMAL LOW (ref 4.22–5.81)
WBC: 1.8 10*3/uL — ABNORMAL LOW (ref 4.0–10.5)

## 2013-03-05 LAB — BASIC METABOLIC PANEL
BUN: 17 mg/dL (ref 6–23)
Chloride: 104 mEq/L (ref 96–112)
GFR calc non Af Amer: 76 mL/min — ABNORMAL LOW (ref >90)
Glucose, Bld: 120 mg/dL — ABNORMAL HIGH (ref 70–99)
Potassium: 3.9 mEq/L (ref 3.5–5.1)
Sodium: 138 mEq/L (ref 135–145)

## 2013-03-05 LAB — GLUCOSE, CAPILLARY: Glucose-Capillary: 133 mg/dL — ABNORMAL HIGH (ref 70–99)

## 2013-03-05 MED ORDER — HEPARIN SOD (PORK) LOCK FLUSH 100 UNIT/ML IV SOLN
INTRAVENOUS | Status: AC
  Administered 2013-03-05: 500 [IU]
  Filled 2013-03-05: qty 5

## 2013-03-05 MED ORDER — CEFAZOLIN SODIUM-DEXTROSE 2-3 GM-% IV SOLR: 2.0000 g | Freq: Three times a day (TID) | INTRAVENOUS | Status: DC

## 2013-03-05 MED ORDER — SODIUM CHLORIDE 0.9 % IJ SOLN: 10.0000 mL | INTRAMUSCULAR | Status: DC | PRN

## 2013-03-05 MED ORDER — SODIUM CHLORIDE 0.9 % IJ SOLN: 10.0000 mL | Freq: Two times a day (BID) | INTRAMUSCULAR | Status: DC

## 2013-03-05 NOTE — Progress Notes (Signed)
Pt D/C to [facility]. Pt is alert and oriented, no new complains, D/C instructions done, medication administration done, pt verbalizes understanding. RN called in report to Paramedic ([facility])

## 2013-03-05 NOTE — Telephone Encounter (Signed)
Received call from pt's wife, Junious Dresser that pt is going to [facility] in [place] from [facility] today & is wondering about appt 03/27/13 for lab, Lonna Cobb NP, & treatment, specifically if it should be changed.  Call back # is [phone]

## 2013-03-05 NOTE — Progress Notes (Signed)
Pt for discharge to [facility] [place]  CSW facilitated pt discharge including contacting facility, faxing pt discharge information via [facility], discussing with pt at bedside and discussing that ambulance company will submit ambulance transport with insurance, but there is not guarantee it will be covered. Pt expressed understanding and discussed with wife via telephone and still opts to have ambulance transport ([facility]) to [facility]. CSW provided phone number for RN to call report and arranged ambulance transportation ([facility]).  No further social work needs identified at this time.  CSW signing off.   Jacklynn Lewis, MSW, LCSWA  Clinical Social Work [phone]

## 2013-03-05 NOTE — Progress Notes (Signed)
Peripherally Inserted Central Catheter/Midline Placement  The IV Nurse has discussed with the patient and/or persons authorized to consent for the patient, the purpose of this procedure and the potential benefits and risks involved with this procedure.  The benefits include less needle sticks, lab draws from the catheter and patient may be discharged home with the catheter.  Risks include, but not limited to, infection, bleeding, blood clot (thrombus formation), and puncture of an artery; nerve damage and irregular heat beat.  Alternatives to this procedure were also discussed.  PICC/Midline Placement Documentation        Lisabeth Devoid 03/05/2013, 2:11 PM Consent obtained by Lazarus Gowda, RN

## 2013-03-05 NOTE — Progress Notes (Signed)
INFECTIOUS DISEASE PROGRESS NOTE  ID: [name] is a 79 y.o. male with   Principal Problem:   Diabetic foot ulcer associated with type 2 diabetes mellitus Active Problems:   Anemia   Peripheral arterial disease   Fever and chills   Type II or unspecified type diabetes mellitus with neurological manifestations, not stated as uncontrolled(250.60)   Mantle cell lymphoma   Carotid artery disease   Essential hypertension, benign   Aortic stenosis, moderate   CAD (coronary artery disease)   Hyponatremia   Staphylococcus aureus bacteremia   Other pancytopenia  Subjective: Without complaints  Abtx:  Anti-infectives   Start     Dose/Rate Route Frequency Ordered Stop   03/05/13 0000  ceFAZolin (ANCEF) 2-3 GM-% SOLR     2 g 100 mL/hr over 30 Minutes Intravenous Every 8 hours 03/05/13 0810     03/03/13 1400  ceFAZolin (ANCEF) IVPB 2 g/50 mL premix     2 g 100 mL/hr over 30 Minutes Intravenous 3 times per day 03/03/13 1210     03/03/13 0000  piperacillin-tazobactam (ZOSYN) 3.375 GM/50ML IVPB  Status:  Discontinued     3.375 g 12.5 mL/hr over 240 Minutes Intravenous Every 8 hours 03/03/13 0821 03/05/13    03/03/13 0000  vancomycin (VANCOCIN) 1 GM/200ML SOLN  Status:  Discontinued     1,000 mg 200 mL/hr over 60 Minutes Intravenous Every 12 hours 03/03/13 0821 03/05/13    03/01/13 0800  vancomycin (VANCOCIN) IVPB 1000 mg/200 mL premix  Status:  Discontinued     1,000 mg 200 mL/hr over 60 Minutes Intravenous Every 12 hours 02/28/13 1931 03/03/13 1245   03/01/13 0200  piperacillin-tazobactam (ZOSYN) IVPB 3.375 g  Status:  Discontinued     3.375 g 12.5 mL/hr over 240 Minutes Intravenous Every 8 hours 02/28/13 1931 03/03/13 1245   03/01/13 0130  acyclovir (ZOVIRAX) tablet 400 mg     400 mg Oral 2 times daily 03/01/13 0103     02/28/13 1830  vancomycin (VANCOCIN) IVPB 1000 mg/200 mL premix     1,000 mg 200 mL/hr over 60 Minutes Intravenous  Once 02/28/13 1824 02/28/13 2219   02/28/13 1830  piperacillin-tazobactam (ZOSYN) IVPB 3.375 g     3.375 g 100 mL/hr over 30 Minutes Intravenous  Once 02/28/13 1824 02/28/13 2018      Medications:  Scheduled: . acyclovir  400 mg Oral BID  . aspirin EC  81 mg Oral Daily  .  ceFAZolin (ANCEF) IV  2 g Intravenous Q8H  . chlorhexidine  60 mL Topical Once  . enoxaparin (LOVENOX) injection  40 mg Subcutaneous Q24H  . gabapentin  300 mg Oral TID  . gabapentin  600 mg Oral Custom  . insulin aspart  0-15 Units Subcutaneous TID WC  . insulin aspart  0-5 Units Subcutaneous QHS  . insulin glargine  10 Units Subcutaneous QHS  . isosorbide mononitrate  30 mg Oral Daily  . losartan  100 mg Oral Daily  . metoprolol succinate  25 mg Oral Daily  . potassium chloride  20 mEq Oral Daily  . simvastatin  10 mg Oral q1800  . vitamin B-12  500 mcg Oral Daily    Objective: Vital signs in last 24 hours: Temp:  [98.5 F (36.9 C)-98.9 F (37.2 C)] 98.5 F (36.9 C) (03/31 0620) Pulse Rate:  [76] 76 (03/31 0620) Resp:  [16] 16 (03/31 0620) BP: (143-173)/(46-72) 173/72 mmHg (03/31 0620) SpO2:  [95 %-97 %] 97 % (03/31 0620)  General appearance: alert, cooperative and no distress Resp: rhonchi anterior - left Cardio: irregularly irregular rhythm and systolic murmur: early systolic 2/6, crescendo at 2nd left intercostal space GI: normal findings: bowel sounds normal and soft, non-tender Extremities: edema 3+ L ankle. L foot wrapped, clean. no light touch in his exposed toes.  and LUE preipheral IV  Lab Results  Recent Labs  03/05/13 0406  WBC 1.8*  HGB 9.6*  HCT 27.6*  NA 138  K 3.9  CL 104  CO2 25  BUN 17  CREATININE 0.92   Liver Panel No results found for this basename: PROT, ALBUMIN, AST, ALT, ALKPHOS, BILITOT, BILIDIR, IBILI,  in the last 72 hours Sedimentation Rate No results found for this basename: ESRSEDRATE,  in the last 72 hours C-Reactive Protein No results found for this basename: CRP,  in the last 72  hours  Microbiology: Recent Results (from the past 240 hour(s))  CULTURE, BLOOD (ROUTINE X 2)     Status: None   Collection Time    02/28/13  5:25 PM      Result Value Range Status   Specimen Description BLOOD LEFT HAND   Final   Special Requests BOTTLES DRAWN AEROBIC AND ANAEROBIC   Final   Culture  Setup Time 03/01/2013 02:01   Final   Culture     Final   Value: STAPHYLOCOCCUS AUREUS     Note: RIFAMPIN AND GENTAMICIN SHOULD NOT BE USED AS SINGLE DRUGS FOR TREATMENT OF STAPH INFECTIONS.     Note: Gram Stain Report Called to,Read Back By and Verified With: REBECCA YOW ON 03/01/2013 AT 9:35P BY WILEJ   Report Status 03/03/2013 FINAL   Final   Organism ID, Bacteria STAPHYLOCOCCUS AUREUS   Final  CULTURE, BLOOD (ROUTINE X 2)     Status: None   Collection Time    02/28/13  5:30 PM      Result Value Range Status   Specimen Description BLOOD RIGHT ARM   Final   Special Requests BOTTLES DRAWN AEROBIC AND ANAEROBIC   Final   Culture  Setup Time 03/01/2013 02:02   Final   Culture     Final   Value: STAPHYLOCOCCUS AUREUS     Note: SUSCEPTIBILITIES PERFORMED ON PREVIOUS CULTURE WITHIN THE LAST 5 DAYS.     Note: Gram Stain Report Called to,Read Back By and Verified With: CAROLINE MURRY 03/02/2013 5:05AM YIMSU   Report Status 03/03/2013 FINAL   Final  URINE CULTURE     Status: None   Collection Time    02/28/13  6:11 PM      Result Value Range Status   Specimen Description URINE, CLEAN CATCH   Final   Special Requests Normal   Final   Culture  Setup Time 03/01/2013 01:33   Final   Colony Count NO GROWTH   Final   Culture NO GROWTH   Final   Report Status 03/01/2013 FINAL   Final  WOUND CULTURE     Status: None   Collection Time    02/28/13  7:01 PM      Result Value Range Status   Specimen Description FOOT   Final   Special Requests Normal   Final   Gram Stain     Final   Value: NO WBC SEEN     NO SQUAMOUS EPITHELIAL CELLS SEEN     RARE GRAM POSITIVE COCCI     IN PAIRS    Culture     Final  Value: MODERATE STAPHYLOCOCCUS AUREUS     Note: RIFAMPIN AND GENTAMICIN SHOULD NOT BE USED AS SINGLE DRUGS FOR TREATMENT OF STAPH INFECTIONS.   Report Status 03/03/2013 FINAL   Final   Organism ID, Bacteria STAPHYLOCOCCUS AUREUS   Final  SURGICAL PCR SCREEN     Status: Abnormal   Collection Time    03/01/13  8:59 AM      Result Value Range Status   MRSA, PCR NEGATIVE  NEGATIVE Final   Staphylococcus aureus POSITIVE (*) NEGATIVE Final   Comment:            The Xpert SA Assay (FDA     approved for NASAL specimens     in patients over 12 years of age),     is one component of     a comprehensive surveillance     program.  Test performance has     been validated by [employer] for patients greater     than or equal to 25 year old.     It is not intended     to diagnose infection nor to     guide or monitor treatment.  TISSUE CULTURE     Status: None   Collection Time    03/01/13  5:15 PM      Result Value Range Status   Specimen Description FOOT LEFT DEEP TISSUE FROM ABSCESS.   Final   Special Requests NONE   Final   Gram Stain     Final   Value: NO WBC SEEN     NO ORGANISMS SEEN   Culture     Final   Value: MODERATE STAPHYLOCOCCUS AUREUS     Note: RIFAMPIN AND GENTAMICIN SHOULD NOT BE USED AS SINGLE DRUGS FOR TREATMENT OF STAPH INFECTIONS.   Report Status 03/04/2013 FINAL   Final   Organism ID, Bacteria STAPHYLOCOCCUS AUREUS   Final  CULTURE, BLOOD (ROUTINE X 2)     Status: None   Collection Time    03/03/13 12:15 PM      Result Value Range Status   Specimen Description BLOOD RIGHT ARM   Final   Special Requests     Final   Value: BOTTLES DRAWN AEROBIC AND ANAEROBIC 5CC BOTH BOTTLES   Culture  Setup Time 03/03/2013 18:02   Final   Culture     Final   Value:        BLOOD CULTURE RECEIVED NO GROWTH TO DATE CULTURE WILL BE HELD FOR 5 DAYS BEFORE ISSUING A FINAL NEGATIVE REPORT   Report Status PENDING   Incomplete  CULTURE, BLOOD (ROUTINE X 2)      Status: None   Collection Time    03/03/13 12:30 PM      Result Value Range Status   Specimen Description BLOOD RIGHT ARM   Final   Special Requests     Final   Value: BOTTLES DRAWN AEROBIC AND ANAEROBIC 5CC BOTH BOTTLES   Culture  Setup Time 03/03/2013 18:02   Final   Culture     Final   Value:        BLOOD CULTURE RECEIVED NO GROWTH TO DATE CULTURE WILL BE HELD FOR 5 DAYS BEFORE ISSUING A FINAL NEGATIVE REPORT   Report Status PENDING   Incomplete    Studies/Results: No results found.   Assessment/Plan: MSSA Bacteremia (2/2) 03-01-13  Repeat BCx 03-03-13 ngtd  TTE- LVH, diastolic dysfxn, AR, AS, mitral calcification.  Diabetic Foot   I &  D L foot abscess, 1st ray amputation revision (3-27)  Tissue Cx MSSA  Mantle Cell Lymphoma  Would- continue Ancef for total of 28 days (today is 5 of anbx so 23 more) PIC to be placed today Repeat BCx 2 weeks after 28 day course.  To rehab/snf Available if questions  [name] Infectious Diseases [phone] 03/05/2013, 1:31 PM   LOS: 5 days

## 2013-03-05 NOTE — Progress Notes (Signed)
Subjective: 4 Days Post-Op Procedure(s) (LRB): IRRIGATION AND DEBRIDEMENT EXTREMITY (Left) Patient reports pain as mild.  No n/v.  Objective: Vital signs in last 24 hours: Temp:  [98.5 F (36.9 C)-98.9 F (37.2 C)] 98.5 F (36.9 C) (03/31 0620) Pulse Rate:  [64-76] 76 (03/31 0620) Resp:  [16] 16 (03/31 0620) BP: (141-173)/(46-72) 173/72 mmHg (03/31 0620) SpO2:  [95 %-98 %] 97 % (03/31 0620)  Intake/Output from previous day: 03/30 0701 - 03/31 0700 In: 3020 [P.O.:1400; I.V.:1620] Out: 3700 [Urine:3700] Intake/Output this shift:     Recent Labs  03/05/13 0406  HGB 9.6*    Recent Labs  03/05/13 0406  WBC 1.8*  RBC 2.75*  HCT 27.6*  PLT 93*    Recent Labs  03/05/13 0406  NA 138  K 3.9  CL 104  CO2 25  BUN 17  CREATININE 0.92  GLUCOSE 120*  CALCIUM 9.0   No results found for this basename: LABPT, INR,  in the last 72 hours  wound c/d/i.  slight maceration plantarly.  no signs of infection.  Assessment/Plan: 4 Days Post-Op Procedure(s) (LRB): IRRIGATION AND DEBRIDEMENT EXTREMITY (Left) Discharge to SNF  [name] 03/05/2013, 1:05 PM

## 2013-03-05 NOTE — Discharge Summary (Addendum)
Physician Discharge Summary  Patient ID: [name] MRN: [id number] DOB/AGE: 1927/02/22 79 y.o.  Admit date: 02/28/2013 Discharge date: 03/05/2013   Discharge Diagnoses:  Principal Problem:   Diabetic foot ulcer associated with type 2 diabetes mellitus Active Problems:   Anemia   Peripheral arterial disease   Staphylococcus aureus bacteremia   Other pancytopenia   Fever and chills   Mantle cell lymphoma   Carotid artery disease   CAD (coronary artery disease)   Essential hypertension, benign   Aortic stenosis, moderate   Hyponatremia   Type II or unspecified type diabetes mellitus with neurological manifestations, not stated as uncontrolled(250.60)   Discharged Condition: good  Hospital Course:   Patient is an 79 year old Caucasian man with several medical problems who had been doing well until an episode of vomiting with chills. Wife thought he had the flu but when she removed his sock, a new issue with bloody drainage from the 1st MTP area on the left was noted. He was seen by Dr Victorino Dike in his office and a large amount of bloody pus was expressed from the wound. He was sent here for admissions. He is febrile with chills and sweats. He is still a bit nauseous but has not vomited any more. He had a normal formed BM earlier today. He feels weak. He denies CP or SOB. He has no abdominal pain. His foot does not hurt. FBS today was 180 and has been 120 and 150 in recent days. He is still getting monthly chemo for his mantle cell lymphoma. He was hospitalized 7 times in the past year and had 2 foot procedures with toe amputations. At last visit in our office in FEB he had well controlled DM and was otherwise doing well. He has PVD and insensate feet.  He was admitted to the hospital started on broad-spectrum IV antibiotics. He went on to have completion of left first ray amputation by Dr. Victorino Dike who noted an abscess pocket during surgery. Cultures from the operation grew out methicillin  sensitive Staphylococcus aureus in his IV medication was changed to a cephalosporin. He also had a transthoracic echocardiogram done that showed normal left ventricular systolic function with moderate aortic stenosis and mild mitral regurgitation, but no vegetations. On the day of discharge she was feeling fine without significant left foot pain. His appetite has been good and he has not had problems with shortness of breath, chest pain, abdominal pain, diarrhea, or dysuria. The plan is for him to get a PICC line placed today and then transferred to a skilled nursing facility for 14 days of IV antibiotics and physical therapy.   Consults: orthopedic surgery  Significant Diagnostic Studies:  No results found.  Labs: Lab Results  Component Value Date   WBC 1.8* 03/05/2013   HGB 9.6* 03/05/2013   HCT 27.6* 03/05/2013   MCV 100.4* 03/05/2013   PLT 93* 03/05/2013      Recent Labs Lab 03/01/13 0350 03/05/13 0406  NA 130* 138  K 3.9 3.9  CL 95* 104  CO2 22 25  BUN 19 17  CREATININE 1.00 0.92  CALCIUM 9.0 9.0  PROT 5.6*  --   BILITOT 1.0  --   ALKPHOS 125*  --   ALT 12  --   AST 19  --   GLUCOSE 134* 120*       Lab Results  Component Value Date   INR 1.09 09/08/2012   INR 1.05 03/29/2012   INR 1.13 02/14/2012  Recent Results (from the past 240 hour(s))  CULTURE, BLOOD (ROUTINE X 2)     Status: None   Collection Time    02/28/13  5:25 PM      Result Value Range Status   Specimen Description BLOOD LEFT HAND   Final   Special Requests BOTTLES DRAWN AEROBIC AND ANAEROBIC   Final   Culture  Setup Time 03/01/2013 02:01   Final   Culture     Final   Value: STAPHYLOCOCCUS AUREUS     Note: RIFAMPIN AND GENTAMICIN SHOULD NOT BE USED AS SINGLE DRUGS FOR TREATMENT OF STAPH INFECTIONS.     Note: Gram Stain Report Called to,Read Back By and Verified With: REBECCA YOW ON 03/01/2013 AT 9:35P BY WILEJ   Report Status 03/03/2013 FINAL   Final   Organism ID, Bacteria STAPHYLOCOCCUS AUREUS    Final  CULTURE, BLOOD (ROUTINE X 2)     Status: None   Collection Time    02/28/13  5:30 PM      Result Value Range Status   Specimen Description BLOOD RIGHT ARM   Final   Special Requests BOTTLES DRAWN AEROBIC AND ANAEROBIC   Final   Culture  Setup Time 03/01/2013 02:02   Final   Culture     Final   Value: STAPHYLOCOCCUS AUREUS     Note: SUSCEPTIBILITIES PERFORMED ON PREVIOUS CULTURE WITHIN THE LAST 5 DAYS.     Note: Gram Stain Report Called to,Read Back By and Verified With: CAROLINE MURRY 03/02/2013 5:05AM YIMSU   Report Status 03/03/2013 FINAL   Final  URINE CULTURE     Status: None   Collection Time    02/28/13  6:11 PM      Result Value Range Status   Specimen Description URINE, CLEAN CATCH   Final   Special Requests Normal   Final   Culture  Setup Time 03/01/2013 01:33   Final   Colony Count NO GROWTH   Final   Culture NO GROWTH   Final   Report Status 03/01/2013 FINAL   Final  WOUND CULTURE     Status: None   Collection Time    02/28/13  7:01 PM      Result Value Range Status   Specimen Description FOOT   Final   Special Requests Normal   Final   Gram Stain     Final   Value: NO WBC SEEN     NO SQUAMOUS EPITHELIAL CELLS SEEN     RARE GRAM POSITIVE COCCI     IN PAIRS   Culture     Final   Value: MODERATE STAPHYLOCOCCUS AUREUS     Note: RIFAMPIN AND GENTAMICIN SHOULD NOT BE USED AS SINGLE DRUGS FOR TREATMENT OF STAPH INFECTIONS.   Report Status 03/03/2013 FINAL   Final   Organism ID, Bacteria STAPHYLOCOCCUS AUREUS   Final  SURGICAL PCR SCREEN     Status: Abnormal   Collection Time    03/01/13  8:59 AM      Result Value Range Status   MRSA, PCR NEGATIVE  NEGATIVE Final   Staphylococcus aureus POSITIVE (*) NEGATIVE Final   Comment:            The Xpert SA Assay (FDA     approved for NASAL specimens     in patients over 54 years of age),     is one component of     a comprehensive surveillance     program.  Test performance has  been validated by [facility] for patients greater     than or equal to 18 year old.     It is not intended     to diagnose infection nor to     guide or monitor treatment.  TISSUE CULTURE     Status: None   Collection Time    03/01/13  5:15 PM      Result Value Range Status   Specimen Description FOOT LEFT DEEP TISSUE FROM ABSCESS.   Final   Special Requests NONE   Final   Gram Stain     Final   Value: NO WBC SEEN     NO ORGANISMS SEEN   Culture     Final   Value: MODERATE STAPHYLOCOCCUS AUREUS     Note: RIFAMPIN AND GENTAMICIN SHOULD NOT BE USED AS SINGLE DRUGS FOR TREATMENT OF STAPH INFECTIONS.   Report Status 03/04/2013 FINAL   Final   Organism ID, Bacteria STAPHYLOCOCCUS AUREUS   Final  CULTURE, BLOOD (ROUTINE X 2)     Status: None   Collection Time    03/03/13 12:15 PM      Result Value Range Status   Specimen Description BLOOD RIGHT ARM   Final   Special Requests     Final   Value: BOTTLES DRAWN AEROBIC AND ANAEROBIC 5CC BOTH BOTTLES   Culture  Setup Time 03/03/2013 18:02   Final   Culture     Final   Value:        BLOOD CULTURE RECEIVED NO GROWTH TO DATE CULTURE WILL BE HELD FOR 5 DAYS BEFORE ISSUING A FINAL NEGATIVE REPORT   Report Status PENDING   Incomplete  CULTURE, BLOOD (ROUTINE X 2)     Status: None   Collection Time    03/03/13 12:30 PM      Result Value Range Status   Specimen Description BLOOD RIGHT ARM   Final   Special Requests     Final   Value: BOTTLES DRAWN AEROBIC AND ANAEROBIC 5CC BOTH BOTTLES   Culture  Setup Time 03/03/2013 18:02   Final   Culture     Final   Value:        BLOOD CULTURE RECEIVED NO GROWTH TO DATE CULTURE WILL BE HELD FOR 5 DAYS BEFORE ISSUING A FINAL NEGATIVE REPORT   Report Status PENDING   Incomplete      Discharge Exam: Blood pressure 173/72, pulse 76, temperature 98.5 F (36.9 C), temperature source Oral, resp. rate 16, height 6\' 1"  (1.854 m), weight 88 kg (194 lb 0.1 oz), SpO2 97.00%.  Physical Exam:  in general, he is an elderly white man  who was in no apparent distress while sitting up in a chair. HEENT exam was within normal limits, neck was supple without jugular distention, chest was clear to auscultation, heart had a regular rate and rhythm with a systolic ejection murmur grade 2/6 the left sternal border, abdomen had normal bowel sounds and no tenderness, extremities were without cyanosis, clubbing, or edema. Neurologic exam was nonfocal. The left foot was wrapped in gauze.   Disposition: He will be discharged from the hospital today and transferred to a skilled nursing facility for continued care. He should have a followup visit with his orthopedic surgeon within one to 2 weeks following hospital discharge.       Discharge Orders   Future Appointments Provider Department Dept Phone   03/27/2013 8:45 AM [facility]  MEDICAL ONCOLOGY [phone]   03/27/2013 9:15 AM Rana Snare, NP [facility] MEDICAL ONCOLOGY [phone]   03/27/2013 10:15 AM [facility] [phone]   05/29/2013 10:00 AM [facility] [phone]   05/29/2013 10:30 AM [facility] MED[facility] [phone]   07/31/2013 10:00 AM [facility] [phone]   Future Orders Complete By Expires     Call MD for:  As directed     Comments:      Call physician for fever, chills, difficulty breathing, chest pain, diarrhea, or other concerning symptoms    Diet - low sodium heart healthy  As directed     Discharge instructions  As directed     Comments:      You'll be discharged from the hospital today to go to a skilled nursing facility for continued IV antibiotics for your foot infection. We will also have physical therapy and wound care at the facility. When she completes rehabilitation at the skilled nursing facility he should schedule followup visit  with Dr. [name] by calling [phone]    Increase activity slowly  As directed         Medication List    TAKE these medications       acetaminophen 325 MG tablet  Commonly known as:  TYLENOL  Take 2 tablets (650 mg total) by mouth every 6 (six) hours as needed.     acyclovir 400 MG tablet  Commonly known as:  ZOVIRAX  Take 400 mg by mouth 2 (two) times daily.     aspirin EC 81 MG tablet  Take 81 mg by mouth every morning.     ceFAZolin 2-3 GM-% Solr  Commonly known as:  ANCEF  Inject 50 mLs (2 g total) into the vein every 8 (eight) hours.     enoxaparin 40 MG/0.4ML injection  Commonly known as:  LOVENOX  Inject 0.4 mLs (40 mg total) into the skin daily.     gabapentin 300 MG capsule  Commonly known as:  NEURONTIN  Take 2 capsules (600 mg total) by mouth 3 (three) times daily.     insulin aspart 100 UNIT/ML injection  Commonly known as:  novoLOG  Inject 0-15 Units into the skin 3 (three) times daily with meals.     insulin aspart 100 UNIT/ML injection  Commonly known as:  novoLOG  Inject 0-5 Units into the skin at bedtime.     insulin glargine 100 UNIT/ML injection  Commonly known as:  LANTUS  Inject 0.1 mLs (10 Units total) into the skin at bedtime.     isosorbide mononitrate 30 MG 24 hr tablet  Commonly known as:  IMDUR  Take 1 tablet (30 mg total) by mouth daily.     losartan 100 MG tablet  Commonly known as:  COZAAR  Take 1 tablet by mouth daily.     meloxicam 15 MG tablet  Commonly known as:  MOBIC  Take 15 mg by mouth daily.     metFORMIN 500 MG tablet  Commonly known as:  GLUCOPHAGE  Take 1,000 mg by mouth daily.     metoprolol succinate 25 MG 24 hr tablet  Commonly known as:  TOPROL-XL  Take 25 mg by mouth daily.     ondansetron 8 MG disintegrating tablet  Commonly known as:  ZOFRAN-ODT  Place 8 mg under the tongue every 8 (eight)  hours as needed. For nausea.     potassium chloride 20 MEQ/15ML (10%) solution  Take 15 mLs (20 mEq  total) by mouth daily.     pravastatin 40 MG tablet  Commonly known as:  PRAVACHOL  Take 40 mg by mouth daily.     vitamin B-12 500 MCG tablet  Commonly known as:  CYANOCOBALAMIN  Take 500 mcg by mouth daily.       Follow-up Information   Follow up with Artice Bergerson, Jonny Ruiz, MD. Schedule an appointment as soon as possible for a visit in 1 week.   Contact information:   [address], [address] [place] [address] [phone]       Signed: Garlan Fillers 03/05/2013, 8:20 AM  ADDENDUM:    Change the right foot dressing with dry gauze, kerlix and ace wrap daily and as needed.  It's ok for the patient to shower and to get the wound wet in the shower.  Clean with soap and water before re-bandaging.  Follow up with me in 1-2 weeks in clinic.  [phone].

## 2013-03-06 ENCOUNTER — Telehealth: Payer: Self-pay | Admitting: *Deleted

## 2013-03-06 NOTE — Telephone Encounter (Signed)
Pt's wife called & states that husband will be at [facility] @ 2 wks after surgery to foot for infection & should be home when his appt is scheduled & therefore should not be a problem to come in for MD visit.

## 2013-03-09 LAB — CULTURE, BLOOD (ROUTINE X 2): Culture: NO GROWTH

## 2013-03-13 ENCOUNTER — Telehealth: Payer: Self-pay | Admitting: *Deleted

## 2013-03-13 NOTE — Telephone Encounter (Signed)
Pt's wife, Junious Dresser called to say that pt is at [facility] & had more surg on his foot to remove abscess, bone & tissue & has a return appt 03/26/13 with surgeon & she is concerned about appt 03/27/13 b/c he might have to have more surgery.  She thinks that his WBC has been low & she will check to see if they are doing any labs at [facility].  Pt is just getting rituxan per Dr. Cyndie Chime so this shouldn't be a problem.

## 2013-03-19 ENCOUNTER — Encounter (HOSPITAL_BASED_OUTPATIENT_CLINIC_OR_DEPARTMENT_OTHER): Payer: Medicare Other | Attending: General Surgery

## 2013-03-19 DIAGNOSIS — L97509 Non-pressure chronic ulcer of other part of unspecified foot with unspecified severity: Secondary | ICD-10-CM | POA: Insufficient documentation

## 2013-03-19 DIAGNOSIS — E1169 Type 2 diabetes mellitus with other specified complication: Secondary | ICD-10-CM | POA: Insufficient documentation

## 2013-03-19 DIAGNOSIS — Z794 Long term (current) use of insulin: Secondary | ICD-10-CM | POA: Insufficient documentation

## 2013-03-19 DIAGNOSIS — S98119A Complete traumatic amputation of unspecified great toe, initial encounter: Secondary | ICD-10-CM | POA: Insufficient documentation

## 2013-03-19 DIAGNOSIS — C8319 Mantle cell lymphoma, extranodal and solid organ sites: Secondary | ICD-10-CM | POA: Insufficient documentation

## 2013-03-19 DIAGNOSIS — Z79899 Other long term (current) drug therapy: Secondary | ICD-10-CM | POA: Insufficient documentation

## 2013-03-20 ENCOUNTER — Telehealth: Payer: Self-pay | Admitting: *Deleted

## 2013-03-20 NOTE — Progress Notes (Signed)
[facility]  NAME:  [name], [name] NO.:  [id number]  MEDICAL RECORD NO.:  [id number]      DATE OF BIRTH:  [birth year]  PHYSICIAN:  Ardath Sax, M.D.           VISIT DATE:                                  OFFICE VISIT   This is an 79 year old man who has many health issues including mantle cell lymphoma, for which he is on chemotherapy.  He also is a type 2 diabetic, and has had great problems with his left foot.  He has recently undergone amputation of the left great toe and down taking whole first metatarsal.  We saw him several weeks ago and sent him to the emergency room because of gangrenous changes in the great toe.  He comes back for [place] to following him.  This is classified as a Wagner 3 diabetic foot ulcer when it started, and he has got a healing wound. The stitches are still in place, and he apparently cultured out an Enterobacter and he is on IV antibiotics in a nursing home.  He otherwise is doing reasonably well.  He is in the nursing home but is tolerating the IV antibiotics.  He also has arteriosclerotic heart disease.  He has had congestive heart failure.  He is on many medicines including Imdur, Cozaar, metformin, acyclovir, gabapentin.  He takes NovoLog insulin and Lantus insulin.  He also is on vitamins and metoprolol.  Today, when we checked him in, he was found to have a temperature of 97.7, pulse 56, respirations 18, blood pressure of 134/60.  He weighs 180 pounds.  His blood sugar was 130.  Today we just washed it and we put on some silver alginate and a dressing.  He will continue the IV antibiotics and we will see him back here in a week.     Ardath Sax, M.D.     PP/MEDQ  D:  03/19/2013  T:  03/20/2013  Job:  [id number]

## 2013-03-20 NOTE — Telephone Encounter (Signed)
Wife called to say that patient could not make appointment for lab/ [facility] Thomas/rx on 03/27/13.  He is in a nursing home getting IV antibiotics TID.  She is aware he has an appt. On 6/24 for lab and chemo.  Is it ok to wait and see Lonna Cobb NP that day or do you need him to try and come before that.? Discussed with Dr.Granfortuna:  Will ask wife to call us when she thinks he is able to be seen.  Will cancel all appts for 4/22 but leave the June 2014 appts. On for right now. Called wife and left  message from Dr. Cyndie Chime on answering machine as she requested.

## 2013-03-27 ENCOUNTER — Ambulatory Visit: Payer: Medicare Other

## 2013-03-27 ENCOUNTER — Other Ambulatory Visit: Payer: Medicare Other | Admitting: Lab

## 2013-03-27 ENCOUNTER — Ambulatory Visit: Payer: Medicare Other | Admitting: Nurse Practitioner

## 2013-04-09 ENCOUNTER — Encounter (HOSPITAL_BASED_OUTPATIENT_CLINIC_OR_DEPARTMENT_OTHER): Payer: Medicare Other | Attending: Plastic Surgery

## 2013-04-09 DIAGNOSIS — T8189XA Other complications of procedures, not elsewhere classified, initial encounter: Secondary | ICD-10-CM | POA: Insufficient documentation

## 2013-04-09 DIAGNOSIS — Y835 Amputation of limb(s) as the cause of abnormal reaction of the patient, or of later complication, without mention of misadventure at the time of the procedure: Secondary | ICD-10-CM | POA: Insufficient documentation

## 2013-04-09 DIAGNOSIS — T8789 Other complications of amputation stump: Secondary | ICD-10-CM | POA: Insufficient documentation

## 2013-04-09 DIAGNOSIS — L97509 Non-pressure chronic ulcer of other part of unspecified foot with unspecified severity: Secondary | ICD-10-CM | POA: Insufficient documentation

## 2013-04-09 DIAGNOSIS — E1169 Type 2 diabetes mellitus with other specified complication: Secondary | ICD-10-CM | POA: Insufficient documentation

## 2013-04-19 ENCOUNTER — Other Ambulatory Visit: Payer: Self-pay | Admitting: Oncology

## 2013-04-19 DIAGNOSIS — C8310 Mantle cell lymphoma, unspecified site: Secondary | ICD-10-CM

## 2013-04-25 ENCOUNTER — Telehealth: Payer: Self-pay | Admitting: *Deleted

## 2013-04-25 NOTE — Telephone Encounter (Signed)
Wife is calling.  Patient has had several surgeries on his foot, the last one being 3/27.  After that he was moved to [facility] where he had a PICC line with IV antibiotics and PT.  He is currently home and going to the wound center.  His foot is still healing and his wife says overall he is much better.  She is wondering about his chemotherapy.  He is due for lab and chemo on 6/24. Discussed with Dr. Cyndie Chime, he said patient can keep 6/24 on his schedule.  He will look at where he can be seen on his schedule or Lisa's before 6/24.  Let his wife know what the plan is and she will wait for call to schedule MD or NP appt.

## 2013-04-27 ENCOUNTER — Other Ambulatory Visit (HOSPITAL_COMMUNITY): Payer: Self-pay | Admitting: Oncology

## 2013-05-01 ENCOUNTER — Other Ambulatory Visit: Payer: Self-pay | Admitting: Dermatology

## 2013-05-03 ENCOUNTER — Other Ambulatory Visit: Payer: Self-pay | Admitting: *Deleted

## 2013-05-04 ENCOUNTER — Telehealth: Payer: Self-pay | Admitting: Oncology

## 2013-05-04 NOTE — Telephone Encounter (Signed)
Called pt's wife and left message regarding appt on 6/25 lab, ML and chemo

## 2013-05-07 ENCOUNTER — Encounter (HOSPITAL_BASED_OUTPATIENT_CLINIC_OR_DEPARTMENT_OTHER): Payer: Medicare Other | Attending: General Surgery

## 2013-05-07 DIAGNOSIS — S98119A Complete traumatic amputation of unspecified great toe, initial encounter: Secondary | ICD-10-CM | POA: Insufficient documentation

## 2013-05-07 DIAGNOSIS — S98139A Complete traumatic amputation of one unspecified lesser toe, initial encounter: Secondary | ICD-10-CM | POA: Insufficient documentation

## 2013-05-07 DIAGNOSIS — L97509 Non-pressure chronic ulcer of other part of unspecified foot with unspecified severity: Secondary | ICD-10-CM | POA: Insufficient documentation

## 2013-05-07 DIAGNOSIS — E1169 Type 2 diabetes mellitus with other specified complication: Secondary | ICD-10-CM | POA: Insufficient documentation

## 2013-05-29 ENCOUNTER — Ambulatory Visit: Payer: Medicare Other | Admitting: Nurse Practitioner

## 2013-05-29 ENCOUNTER — Other Ambulatory Visit: Payer: Medicare Other | Admitting: Lab

## 2013-05-29 ENCOUNTER — Ambulatory Visit: Payer: Medicare Other

## 2013-05-30 ENCOUNTER — Telehealth: Payer: Self-pay | Admitting: Oncology

## 2013-05-30 ENCOUNTER — Other Ambulatory Visit (HOSPITAL_BASED_OUTPATIENT_CLINIC_OR_DEPARTMENT_OTHER): Payer: Medicare Other | Admitting: Lab

## 2013-05-30 ENCOUNTER — Telehealth: Payer: Self-pay | Admitting: *Deleted

## 2013-05-30 ENCOUNTER — Ambulatory Visit (HOSPITAL_BASED_OUTPATIENT_CLINIC_OR_DEPARTMENT_OTHER): Payer: Medicare Other

## 2013-05-30 ENCOUNTER — Ambulatory Visit (HOSPITAL_BASED_OUTPATIENT_CLINIC_OR_DEPARTMENT_OTHER): Payer: Medicare Other | Admitting: Nurse Practitioner

## 2013-05-30 VITALS — BP 138/68 | HR 54 | Temp 97.5°F | Resp 16

## 2013-05-30 VITALS — BP 122/50 | HR 49 | Temp 97.8°F | Resp 19 | Ht 73.0 in | Wt 186.5 lb

## 2013-05-30 DIAGNOSIS — E119 Type 2 diabetes mellitus without complications: Secondary | ICD-10-CM

## 2013-05-30 DIAGNOSIS — I4891 Unspecified atrial fibrillation: Secondary | ICD-10-CM

## 2013-05-30 DIAGNOSIS — C8313 Mantle cell lymphoma, intra-abdominal lymph nodes: Secondary | ICD-10-CM

## 2013-05-30 DIAGNOSIS — I739 Peripheral vascular disease, unspecified: Secondary | ICD-10-CM

## 2013-05-30 DIAGNOSIS — Z5112 Encounter for antineoplastic immunotherapy: Secondary | ICD-10-CM

## 2013-05-30 DIAGNOSIS — R161 Splenomegaly, not elsewhere classified: Secondary | ICD-10-CM

## 2013-05-30 LAB — COMPREHENSIVE METABOLIC PANEL (CC13)
Albumin: 3.8 g/dL (ref 3.5–5.0)
BUN: 25.6 mg/dL (ref 7.0–26.0)
CO2: 24 mEq/L (ref 22–29)
Calcium: 9.9 mg/dL (ref 8.4–10.4)
Chloride: 108 mEq/L — ABNORMAL HIGH (ref 98–107)
Glucose: 109 mg/dl — ABNORMAL HIGH (ref 70–99)
Potassium: 4.6 mEq/L (ref 3.5–5.1)

## 2013-05-30 LAB — CBC WITH DIFFERENTIAL/PLATELET
Basophils Absolute: 0 10*3/uL (ref 0.0–0.1)
Eosinophils Absolute: 0.1 10*3/uL (ref 0.0–0.5)
HCT: 35.8 % — ABNORMAL LOW (ref 38.4–49.9)
HGB: 11.5 g/dL — ABNORMAL LOW (ref 13.0–17.1)
NEUT#: 1.4 10*3/uL — ABNORMAL LOW (ref 1.5–6.5)
NEUT%: 52.9 % (ref 39.0–75.0)
RDW: 14.8 % — ABNORMAL HIGH (ref 11.0–14.6)
lymph#: 0.7 10*3/uL — ABNORMAL LOW (ref 0.9–3.3)

## 2013-05-30 LAB — LACTATE DEHYDROGENASE (CC13): LDH: 211 U/L (ref 125–245)

## 2013-05-30 MED ORDER — DIPHENHYDRAMINE HCL 25 MG PO CAPS
50.0000 mg | ORAL_CAPSULE | Freq: Once | ORAL | Status: AC
  Administered 2013-05-30: 50 mg via ORAL

## 2013-05-30 MED ORDER — SODIUM CHLORIDE 0.9 % IV SOLN
375.0000 mg/m2 | Freq: Once | INTRAVENOUS | Status: AC
  Administered 2013-05-30: 800 mg via INTRAVENOUS
  Filled 2013-05-30: qty 80

## 2013-05-30 MED ORDER — SODIUM CHLORIDE 0.9 % IV SOLN
Freq: Once | INTRAVENOUS | Status: AC
  Administered 2013-05-30: 12:00:00 via INTRAVENOUS

## 2013-05-30 MED ORDER — ACETAMINOPHEN 325 MG PO TABS
650.0000 mg | ORAL_TABLET | Freq: Once | ORAL | Status: AC
  Administered 2013-05-30: 650 mg via ORAL

## 2013-05-30 NOTE — Telephone Encounter (Signed)
Per staff message and POF I have scheduled appts.  JMW  

## 2013-05-30 NOTE — Telephone Encounter (Signed)
gv and printed appt sched and avs for pt....MW added tx   °

## 2013-05-30 NOTE — Telephone Encounter (Signed)
gv pt barium °

## 2013-05-30 NOTE — Patient Instructions (Addendum)
[facility] Discharge Instructions for Patients Receiving Chemotherary  Today you received the following chemotherapy agents rituxan  To help prevent nausea and vomiting after your treatment, we encourage you to take your nausea medication  and take it as often as prescribed   If you develop nausea and vomiting that is not controlled by your nausea medication, call the clinic. If it is after clinic hours your family physician or the after hours number for the clinic or go to the Emergency Department.   BELOW ARE SYMPTOMS THAT SHOULD BE REPORTED IMMEDIATELY:  *FEVER GREATER THAN 100.5 F  *CHILLS WITH OR WITHOUT FEVER  NAUSEA AND VOMITING THAT IS NOT CONTROLLED WITH YOUR NAUSEA MEDICATION  *UNUSUAL SHORTNESS OF BREATH  *UNUSUAL BRUISING OR BLEEDING  TENDERNESS IN MOUTH AND THROAT WITH OR WITHOUT PRESENCE OF ULCERS  *URINARY PROBLEMS  *BOWEL PROBLEMS  UNUSUAL RASH Items with * indicate a potential emergency and should be followed up as soon as possible.  One of the nurses will contact you 24 hours after your treatment. Please let the nurse know about any problems that you may have experienced. Feel free to call the clinic you have any questions or concerns. The clinic phone number is [phone].   I have been informed and understand all the instructions given to me. I know to contact the clinic, my physician, or go to the Emergency Department if any problems should occur. I do not have any questions at this time, but understand that I may call the clinic during office hours   should I have any questions or need assistance in obtaining follow up care.    __________________________________________  _____________  __________ Signature of Patient or Authorized Representative            Date                   Time    __________________________________________ Nurse's Signature

## 2013-05-30 NOTE — Progress Notes (Signed)
OFFICE PROGRESS NOTE  Interval history:  [name] is an 79 year old man with mantle cell lymphoma with clinical stage III disease. He initially presented with weight loss, splenomegaly and pancytopenia in March 2013. He completed 6 cycles of bendamustine/Rituxan through 09/29/2012 with a partial response. His performance status improved. He regained lost weight and the spleen decreased from 18 to 16 cm. He was started on consolidation with Rituxan on 11/08/2012 with plans to give 4 weekly doses and then every 2 month maintenance for 4 additional doses. He received Rituxan on 11/08/2012 and 11/15/2012. The next Rituxan was given 12/08/2012 and again on 01/30/2013. Treatment has been interrupted/delayed due to foot infections requiring toe amputation. He was most recently hospitalized 02/28/2013 through 03/05/2013 with a diabetic foot ulcer. He underwent irrigation and debridement of a left foot abscess including multiple areas and revision of left first ray amputation on 03/01/2013. He was discharged to a nursing facility where he remained for one month.  He is seen today for scheduled followup. He feels he has recovered from the most recent foot infection/surgery. He is no longer on antibiotics. He has a good appetite. No fevers or sweats. He reports a good energy level. He denies abdominal pain. No fever. No cough or shortness of breath. He is being followed by dermatology for basal cell skin cancer.   Objective: Blood pressure 122/50, pulse 49, temperature 97.8 F (36.6 C), temperature source Oral, resp. rate 19, height 6\' 1"  (1.854 m), weight 186 lb 8 oz (84.596 kg).  Oropharynx is without thrush or ulceration. No palpable cervical, supraclavicular, axillary or inguinal lymph nodes. Lungs are clear. Regular cardiac rhythm. 2/6 systolic murmur. Abdomen soft and nontender. Spleen not palpable on exam today. Extremities are without edema. Superficial skin abrasion at the left pretibial region. No  surrounding erythema. Left medial foot incision healed.   Lab Results: Lab Results  Component Value Date   WBC 2.6* 05/30/2013   HGB 11.5* 05/30/2013   HCT 35.8* 05/30/2013   MCV 100.3* 05/30/2013   PLT 90* 05/30/2013    Chemistry:    Chemistry      Component Value Date/Time   NA 138 03/05/2013 0406   NA 139 01/30/2013 0943   K 3.9 03/05/2013 0406   K 4.3 01/30/2013 0943   CL 104 03/05/2013 0406   CL 106 01/30/2013 0943   CO2 25 03/05/2013 0406   CO2 24 01/30/2013 0943   BUN 17 03/05/2013 0406   BUN 27.5* 01/30/2013 0943   CREATININE 0.92 03/05/2013 0406   CREATININE 1.1 01/30/2013 0943      Component Value Date/Time   CALCIUM 9.0 03/05/2013 0406   CALCIUM 9.9 01/30/2013 0943   ALKPHOS 125* 03/01/2013 0350   ALKPHOS 140 01/30/2013 0943   AST 19 03/01/2013 0350   AST 25 01/30/2013 0943   ALT 12 03/01/2013 0350   ALT 20 01/30/2013 0943   BILITOT 1.0 03/01/2013 0350   BILITOT 0.58 01/30/2013 0943       Studies/Results: No results found.  Medications: I have reviewed the patient's current medications.  Assessment/Plan:  1. Clinical stage III mantle cell lymphoma with previous treatment as outlined above. Currently completing a course of maintenance Rituxan with 4 treatments planned every 2 months. He completed the first treatment on 01/30/2013. The second treatment was missed due to a diabetic foot ulcer requiring irrigation and debridement and revision of a previous toe amputation. 2. Advanced coronary artery disease status post bypass surgery, status post stent. 3. Obstructive airway disease.  4. Type 2 diabetes. 5. Peripheral vascular disease. 6. Postherpetic neuralgia on chronic gabapentin and prophylactic Zovirax. 7. Hyperlipidemia. 8. History of atrial flutter.  Disposition-[name] appears stable. Plan to proceed with treatment 2 of 4 maintenance Rituxan given every 2 months. We are referring him for restaging CT scans in the next one month. He will return for a followup visit  and treatment 3 of maintenance Rituxan in 2 months.  Plan reviewed with Dr. Cyndie Chime.  Lonna Cobb ANP/GNP-BC

## 2013-06-18 ENCOUNTER — Other Ambulatory Visit: Payer: Self-pay | Admitting: Cardiology

## 2013-07-02 ENCOUNTER — Ambulatory Visit (HOSPITAL_COMMUNITY)
Admission: RE | Admit: 2013-07-02 | Discharge: 2013-07-02 | Disposition: A | Discharge status: Home / Self Care | Payer: Medicare Other | Source: Ambulatory Visit | Attending: Nurse Practitioner | Admitting: Nurse Practitioner

## 2013-07-02 ENCOUNTER — Telehealth: Payer: Self-pay | Admitting: *Deleted

## 2013-07-02 ENCOUNTER — Encounter (HOSPITAL_COMMUNITY): Payer: Self-pay

## 2013-07-02 ENCOUNTER — Other Ambulatory Visit (HOSPITAL_BASED_OUTPATIENT_CLINIC_OR_DEPARTMENT_OTHER): Payer: Medicare Other | Admitting: Lab

## 2013-07-02 DIAGNOSIS — K5730 Diverticulosis of large intestine without perforation or abscess without bleeding: Secondary | ICD-10-CM | POA: Insufficient documentation

## 2013-07-02 DIAGNOSIS — M47814 Spondylosis without myelopathy or radiculopathy, thoracic region: Secondary | ICD-10-CM | POA: Insufficient documentation

## 2013-07-02 DIAGNOSIS — C8313 Mantle cell lymphoma, intra-abdominal lymph nodes: Secondary | ICD-10-CM | POA: Insufficient documentation

## 2013-07-02 DIAGNOSIS — N200 Calculus of kidney: Secondary | ICD-10-CM | POA: Insufficient documentation

## 2013-07-02 DIAGNOSIS — R918 Other nonspecific abnormal finding of lung field: Secondary | ICD-10-CM | POA: Insufficient documentation

## 2013-07-02 DIAGNOSIS — R161 Splenomegaly, not elsewhere classified: Secondary | ICD-10-CM | POA: Insufficient documentation

## 2013-07-02 DIAGNOSIS — I700 Atherosclerosis of aorta: Secondary | ICD-10-CM | POA: Insufficient documentation

## 2013-07-02 DIAGNOSIS — Z951 Presence of aortocoronary bypass graft: Secondary | ICD-10-CM | POA: Insufficient documentation

## 2013-07-02 LAB — CBC WITH DIFFERENTIAL/PLATELET
Basophils Absolute: 0 10*3/uL (ref 0.0–0.1)
EOS%: 5 % (ref 0.0–7.0)
Eosinophils Absolute: 0.1 10*3/uL (ref 0.0–0.5)
HCT: 35.5 % — ABNORMAL LOW (ref 38.4–49.9)
HGB: 12.1 g/dL — ABNORMAL LOW (ref 13.0–17.1)
MCH: 33.6 pg — ABNORMAL HIGH (ref 27.2–33.4)
MONO#: 0.5 10*3/uL (ref 0.1–0.9)
NEUT#: 1.4 10*3/uL — ABNORMAL LOW (ref 1.5–6.5)
NEUT%: 55.1 % (ref 39.0–75.0)
lymph#: 0.5 10*3/uL — ABNORMAL LOW (ref 0.9–3.3)

## 2013-07-02 LAB — COMPREHENSIVE METABOLIC PANEL (CC13)
AST: 23 U/L (ref 5–34)
BUN: 24.1 mg/dL (ref 7.0–26.0)
Calcium: 10 mg/dL (ref 8.4–10.4)
Chloride: 108 mEq/L (ref 98–109)
Creatinine: 1 mg/dL (ref 0.7–1.3)

## 2013-07-02 NOTE — Telephone Encounter (Signed)
Per Dr. Cyndie Chime; pt's wife notified of scan stable with no growth of lymphoma.  Spleen remains enlarged but unchanged from before.  Pt's wife verbalized understanding and stated she would inform pt.  Confirmed appt for 07/23/13.

## 2013-07-02 NOTE — Telephone Encounter (Signed)
Message copied by Gala Romney on Mon Jul 02, 2013  3:37 PM ------      Message from: Levert Feinstein      Created: Mon Jul 02, 2013  1:37 PM       Call pt - scans stable - no growth of lymphoma' spleen remians enlarged but unchanged from before ------

## 2013-07-19 ENCOUNTER — Other Ambulatory Visit: Payer: Self-pay | Admitting: *Deleted

## 2013-07-19 MED ORDER — ISOSORBIDE MONONITRATE ER 30 MG PO TB24: ORAL_TABLET | ORAL | Status: DC

## 2013-07-23 ENCOUNTER — Telehealth: Payer: Self-pay | Admitting: Oncology

## 2013-07-23 ENCOUNTER — Telehealth: Payer: Self-pay | Admitting: *Deleted

## 2013-07-23 ENCOUNTER — Ambulatory Visit (HOSPITAL_BASED_OUTPATIENT_CLINIC_OR_DEPARTMENT_OTHER): Payer: Medicare Other

## 2013-07-23 ENCOUNTER — Ambulatory Visit (HOSPITAL_BASED_OUTPATIENT_CLINIC_OR_DEPARTMENT_OTHER): Payer: Medicare Other | Admitting: Oncology

## 2013-07-23 ENCOUNTER — Other Ambulatory Visit (HOSPITAL_BASED_OUTPATIENT_CLINIC_OR_DEPARTMENT_OTHER): Payer: Medicare Other | Admitting: Lab

## 2013-07-23 VITALS — BP 117/58 | HR 48 | Temp 97.6°F | Resp 20 | Ht 73.0 in | Wt 187.1 lb

## 2013-07-23 VITALS — BP 118/61 | HR 49 | Temp 97.0°F | Resp 20

## 2013-07-23 DIAGNOSIS — C8313 Mantle cell lymphoma, intra-abdominal lymph nodes: Secondary | ICD-10-CM

## 2013-07-23 DIAGNOSIS — E119 Type 2 diabetes mellitus without complications: Secondary | ICD-10-CM

## 2013-07-23 DIAGNOSIS — R161 Splenomegaly, not elsewhere classified: Secondary | ICD-10-CM

## 2013-07-23 DIAGNOSIS — Z5112 Encounter for antineoplastic immunotherapy: Secondary | ICD-10-CM

## 2013-07-23 LAB — CBC WITH DIFFERENTIAL/PLATELET
Basophils Absolute: 0 10*3/uL (ref 0.0–0.1)
EOS%: 2.8 % (ref 0.0–7.0)
Eosinophils Absolute: 0.1 10*3/uL (ref 0.0–0.5)
HCT: 34.6 % — ABNORMAL LOW (ref 38.4–49.9)
HGB: 11.5 g/dL — ABNORMAL LOW (ref 13.0–17.1)
MCH: 33.2 pg (ref 27.2–33.4)
MONO#: 0.6 10*3/uL (ref 0.1–0.9)
NEUT#: 2.4 10*3/uL (ref 1.5–6.5)
NEUT%: 62.8 % (ref 39.0–75.0)
lymph#: 0.8 10*3/uL — ABNORMAL LOW (ref 0.9–3.3)

## 2013-07-23 MED ORDER — DIPHENHYDRAMINE HCL 25 MG PO CAPS
50.0000 mg | ORAL_CAPSULE | Freq: Once | ORAL | Status: AC
  Administered 2013-07-23: 50 mg via ORAL

## 2013-07-23 MED ORDER — SODIUM CHLORIDE 0.9 % IV SOLN
375.0000 mg/m2 | Freq: Once | INTRAVENOUS | Status: AC
  Administered 2013-07-23: 800 mg via INTRAVENOUS
  Filled 2013-07-23: qty 80

## 2013-07-23 MED ORDER — SODIUM CHLORIDE 0.9 % IV SOLN
Freq: Once | INTRAVENOUS | Status: AC
  Administered 2013-07-23: 12:00:00 via INTRAVENOUS

## 2013-07-23 MED ORDER — ACETAMINOPHEN 325 MG PO TABS
650.0000 mg | ORAL_TABLET | Freq: Once | ORAL | Status: AC
  Administered 2013-07-23: 650 mg via ORAL

## 2013-07-23 NOTE — Progress Notes (Signed)
Hematology and Oncology Follow Up Visit  [name] [id number] [birth year] 79 y.o. 07/23/2013 4:50 PM   Principle Diagnosis: Encounter Diagnosis  Name Primary?  . Mantle cell lymphoma of intra-abdominal lymph nodes Yes     Interim History:   Follow up visit for this  79 year old man with mantle cell lymphoma with clinical stage III disease. He initially presented with weight loss, splenomegaly and pancytopenia in March 2013. CT scan done on 03/21/2012 showed an 18 cm spleen. There were multiple enlarged periportal lymph nodes and peripancreatic lymph nodes up to 3.9 x 3.1 cm in size. There were enlarged gastrohepatic ligament nodes up to 2.7 x 4.8 cm. Bilateral external iliac lymph nodes up to 2 cm. He had very poor performance status at presentation. He appeared to be in heart failure. He has underlying obstructive airway disease as well. I started treatment very cautiously with single agent bendamustine. He had a nice partial response and  an excellent clinical stabilization.   His performance status improved. He regained lost weight and the spleen decreased from 18 to 16 cm. lymphadenopathy regressed.  He was started on consolidation with Rituxan on 11/08/2012.  He received 2 weekly doses. Treatment was then interrupted due to other medical problems. Treatment was resumed again on 12/08/2012. Interrupted again until February 25. Then interrupted again until June 25. Treatment has been interrupted/delayed due to foot infections requiring toe amputation. He was most recently hospitalized 02/28/2013 through 03/05/2013 with a diabetic foot ulcer. He underwent irrigation and debridement of a left foot abscess including multiple areas and revision of left first amputation on 03/01/2013. He was discharged to a nursing facility where he remained for one month.  He is doing well at this time. He is had no interim infection or fever. Foot has now healed.   Medications: reviewed  Allergies:   Allergies  Allergen Reactions  . Azithromycin Itching  . Macrodantin Itching  . Minocycline Hcl Itching  . Nitrofurantoin Itching  . Zithromax [Azithromycin Dihydrate] Itching  . Contrast Media [Iodinated Diagnostic Agents] Rash    Review of Systems: Constitutional:   No constitutional symptoms at this time Respiratory: No cough or dyspnea Cardiovascular:  No chest pain or palpitations Gastrointestinal: No abdominal pain or change in bowel habit Genito-Urinary: No urinary tract symptoms Musculoskeletal: No musculoskeletal complaints Neurologic: No headache or change in vision Skin: No rash or ecchymosis Remaining ROS negative.  Physical Exam: Blood pressure 117/58, pulse 48, temperature 97.6 F (36.4 C), temperature source Oral, resp. rate 20, height 6\' 1"  (1.854 m), weight 187 lb 1.6 oz (84.868 kg). Wt Readings from Last 3 Encounters:  07/23/13 187 lb 1.6 oz (84.868 kg)  05/30/13 186 lb 8 oz (84.596 kg)  02/28/13 194 lb 0.1 oz (88 kg)     General appearance: Well-nourished Caucasian man HENNT: Pharynx no erythema or exudate Lymph nodes: No lymphadenopathy Breasts: Lungs: Clear to auscultation resonant to percussion Heart: Regular rhythm 3/6 aortic systolic murmur and 2/6 mitral regurgitation murmur Abdomen: Soft, nontender, no masses, no gross splenomegaly Extremities: Resolved peripheral edema. Left toe amputation. Musculoskeletal: No joint deformities GU: Vascular: No cyanosis Neurologic: He is alert and oriented, motor strength 5 over 5, reflexes 1+ symmetric, sensation intact to vibration over the fingers Skin: No rash or ecchymosis  Lab Results: Lab Results  Component Value Date   WBC 3.9* 07/23/2013   HGB 11.5* 07/23/2013   HCT 34.6* 07/23/2013   MCV 100.0* 07/23/2013   PLT 90* 07/23/2013     Chemistry  Component Value Date/Time   NA 140 07/02/2013 0755   NA 138 03/05/2013 0406   K 4.8 07/02/2013 0755   K 3.9 03/05/2013 0406   CL 108* 05/30/2013 0935    CL 104 03/05/2013 0406   CO2 23 07/02/2013 0755   CO2 25 03/05/2013 0406   BUN 24.1 07/02/2013 0755   BUN 17 03/05/2013 0406   CREATININE 1.0 07/02/2013 0755   CREATININE 0.92 03/05/2013 0406      Component Value Date/Time   CALCIUM 10.0 07/02/2013 0755   CALCIUM 9.0 03/05/2013 0406   ALKPHOS 141 07/02/2013 0755   ALKPHOS 125* 03/01/2013 0350   AST 23 07/02/2013 0755   AST 19 03/01/2013 0350   ALT 23 07/02/2013 0755   ALT 12 03/01/2013 0350   BILITOT 0.49 07/02/2013 0755   BILITOT 1.0 03/01/2013 0350       Radiological Studies: Ct Abdomen Pelvis Wo Contrast  07/02/2013   *RADIOLOGY REPORT*  Clinical Data:  Restaging lymphoma  CT CHEST, ABDOMEN AND PELVIS WITHOUT CONTRAST  Technique:  Multidetector CT imaging of the chest, abdomen and pelvis was performed following the standard protocol without IV contrast.  Comparison:  03/04/2013  CT CHEST  Findings:  There is no pleural effusion identified.  Right upper lobe sub solid nodule measures 11 mm, image 14/series 4.  This appears unchanged from previous exam.  Nodule in the superior segment of the right lower lobe measures 8 mm and is also unchanged, image 30/series 4.  There is a 4 mm nodule in the left lower lobe, image 41/series 4.  This is stable from previous study.  Heart size is normal.  Status post median sternotomy and CABG procedure.  No pericardial effusion.  No enlarged mediastinal or hilar adenopathy.  No axillary or supraclavicular adenopathy.  Review of the visualized osseous structures is significant for mild thoracic spondylosis.  IMPRESSION:  1.  No acute findings. 2.  No evidence for mass or adenopathy. 3.  Pulmonary nodules are unchanged from previous exam.  CT ABDOMEN AND PELVIS  Findings:  There is a normal appearance of the liver.  No focal liver abnormalities.  The gallbladder is normal.  No biliary dilatation.  Normal appearance of the pancreas. The spleen is enlarged measuring 16 cm in craniocaudal dimension.  This is similar to previous  exam.  Normal appearance of both adrenal glands.  Small nonobstructing stone within the mid pole of left kidney measures 2-3 mm, image 70/series 2.  There are multiple left renal calculi.  The largest is in the upper pole measuring 5 mm, image 66/series 2.  Urinary bladder appears normal.  The prostate gland and seminal vesicles appear normal.  There is calcified atherosclerotic disease affecting the abdominal aorta.  No aneurysm.  There is no enlarged lymph node within the upper abdomen.  There is no pelvic or inguinal adenopathy.  The stomach is normal.  The small bowel loops are normal in course and caliber.  No obstruction.  The appendix is visualized and appears normal.  Multiple colonic diverticula are identified.  No acute inflammation.  No free fluid or abnormal fluid collections identified within the abdomen or pelvis.  Spondylosis is present throughout the lumbar spine.  IMPRESSION:  1.  No acute findings.  There is no mass or adenopathy identified. 2.  Persistent splenomegaly. 3.  Nonobstructing renal calculi.   Original Report Authenticated By: Signa Kell, M.D.   Ct Chest Wo Contrast  07/02/2013   *RADIOLOGY REPORT*  Clinical Data:  Restaging  lymphoma  CT CHEST, ABDOMEN AND PELVIS WITHOUT CONTRAST  Technique:  Multidetector CT imaging of the chest, abdomen and pelvis was performed following the standard protocol without IV contrast.  Comparison:  03/04/2013  CT CHEST  Findings:  There is no pleural effusion identified.  Right upper lobe sub solid nodule measures 11 mm, image 14/series 4.  This appears unchanged from previous exam.  Nodule in the superior segment of the right lower lobe measures 8 mm and is also unchanged, image 30/series 4.  There is a 4 mm nodule in the left lower lobe, image 41/series 4.  This is stable from previous study.  Heart size is normal.  Status post median sternotomy and CABG procedure.  No pericardial effusion.  No enlarged mediastinal or hilar adenopathy.  No axillary  or supraclavicular adenopathy.  Review of the visualized osseous structures is significant for mild thoracic spondylosis.  IMPRESSION:  1.  No acute findings. 2.  No evidence for mass or adenopathy. 3.  Pulmonary nodules are unchanged from previous exam.  CT ABDOMEN AND PELVIS  Findings:  There is a normal appearance of the liver.  No focal liver abnormalities.  The gallbladder is normal.  No biliary dilatation.  Normal appearance of the pancreas. The spleen is enlarged measuring 16 cm in craniocaudal dimension.  This is similar to previous exam.  Normal appearance of both adrenal glands.  Small nonobstructing stone within the mid pole of left kidney measures 2-3 mm, image 70/series 2.  There are multiple left renal calculi.  The largest is in the upper pole measuring 5 mm, image 66/series 2.  Urinary bladder appears normal.  The prostate gland and seminal vesicles appear normal.  There is calcified atherosclerotic disease affecting the abdominal aorta.  No aneurysm.  There is no enlarged lymph node within the upper abdomen.  There is no pelvic or inguinal adenopathy.  The stomach is normal.  The small bowel loops are normal in course and caliber.  No obstruction.  The appendix is visualized and appears normal.  Multiple colonic diverticula are identified.  No acute inflammation.  No free fluid or abnormal fluid collections identified within the abdomen or pelvis.  Spondylosis is present throughout the lumbar spine.  IMPRESSION:  1.  No acute findings.  There is no mass or adenopathy identified. 2.  Persistent splenomegaly. 3.  Nonobstructing renal calculi.   Original Report Authenticated By: Signa Kell, M.D.    Impression: #1. Clinical stage III mantle cell lymphoma. Excellent partial remission with complete resolution of all areas of pathologic adenopathy. Some reduction in spleen size from 18 to 16 cm and stable on serial CT scans since March. He'll continue maintenance Rituxan every 2 months for a total  of 4 doses. He will receive dose #2 today. Since his diagnosis, a number of new agents have been approved for relapsed mantle cell including Velcade, Revlimid, ibrutinib, and just 2 weeks ago, Zydelig. 3 out of the 4 of these drugs are oral agents that we will be able to use if we need to.   #2. Advanced coronary artery disease status post bypass surgery status post stent  #3. Obstructive airway disease  #4. Type 2 diabetes  #5. Peripheral vascular disease  #6. Postherpetic neuralgia on chronic gabapentin and prophylactic Zovirax  #7. Hyperlipidemia  #8. History of atrial flutter  #9. 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   CC:. Dr. Ivery Quale; Dr. Valera Castle     CC:.    Lovada Barwick  Judie Petit, MD 8/18/20144:50 PM

## 2013-07-23 NOTE — Telephone Encounter (Signed)
Per staff message and POF I have scheduled appts.  JMW  

## 2013-07-23 NOTE — Patient Instructions (Signed)

## 2013-07-23 NOTE — Telephone Encounter (Signed)
Gave pt appt for lab, and ML, emailed [facility] regarding chemo, cancelled chemo for 10/13 per [place], [place]

## 2013-07-25 ENCOUNTER — Telehealth: Payer: Self-pay | Admitting: Oncology

## 2013-07-25 NOTE — Telephone Encounter (Signed)
gave pt appt for lab, ML and chemo for October 2014

## 2013-07-31 ENCOUNTER — Other Ambulatory Visit: Payer: Medicare Other | Admitting: Lab

## 2013-08-08 ENCOUNTER — Ambulatory Visit (INDEPENDENT_AMBULATORY_CARE_PROVIDER_SITE_OTHER): Payer: Medicare Other | Admitting: Cardiovascular Disease

## 2013-08-08 ENCOUNTER — Encounter: Payer: Self-pay | Admitting: Cardiovascular Disease

## 2013-08-08 VITALS — BP 100/58 | HR 51 | Ht 73.0 in | Wt 187.0 lb

## 2013-08-08 DIAGNOSIS — I2510 Atherosclerotic heart disease of native coronary artery without angina pectoris: Secondary | ICD-10-CM

## 2013-08-08 DIAGNOSIS — I350 Nonrheumatic aortic (valve) stenosis: Secondary | ICD-10-CM

## 2013-08-08 DIAGNOSIS — I359 Nonrheumatic aortic valve disorder, unspecified: Secondary | ICD-10-CM

## 2013-08-08 NOTE — Progress Notes (Signed)
HPI:  79 year-old gentleman previously followed by Dr Daleen Squibb, presenting for follow-up evaluation. He was last seen by Tereso Newcomer in January 2014. The patient has a fairly complex history. He has a hx of CAD s/p CABG, with NSTEMI in 2013 requiring stenting of the SVG-PDA (bare-metal stent). He also has been followed for moderate aortic stenosis, type 2 diabetes, and PAD. Other medical comorbidities include Mantle Cell Lymphoma, chronic DVT, anemia, and thrombocytopenia, and first ray amputation for osteomyelitis in 2013.   The patient is doing remarkably well. He still works on his farm. He denies exertional chest pain or tightness, dyspnea, or lightheadedness/syncope. He denies orthopnea, PND, or leg swelling.   He completes treatment with Rituxan later this year and apparently has had a good response to therapy.  Outpatient Encounter Prescriptions as of 08/08/2013  Medication Sig Dispense Refill  . acyclovir (ZOVIRAX) 400 MG tablet TAKE ONE TABLET BY MOUTH TWICE DAILY TO  PREVENT  VIRUS  INFECTION  60 tablet  3  . aspirin EC 81 MG tablet Take 81 mg by mouth every morning.       . gabapentin (NEURONTIN) 300 MG capsule Take by mouth. TAKE 5 CAPSULES DAILY      . isosorbide mononitrate (IMDUR) 30 MG 24 hr tablet TAKE ONE TABLET BY MOUTH EVERY DAY  90 tablet  1  . losartan (COZAAR) 100 MG tablet Take 1 tablet by mouth daily.       . meloxicam (MOBIC) 15 MG tablet Take 15 mg by mouth daily.      . metFORMIN (GLUCOPHAGE) 500 MG tablet Take 1,000 mg by mouth daily.      . metoprolol tartrate (LOPRESSOR) 25 MG tablet TAKE 1/2 TABLET TWICE DAILY      . ondansetron (ZOFRAN-ODT) 8 MG disintegrating tablet Place 8 mg under the tongue every 8 (eight) hours as needed. For nausea.      . pravastatin (PRAVACHOL) 40 MG tablet Take 40 mg by mouth daily.      . vitamin B-12 (CYANOCOBALAMIN) 500 MCG tablet Take 500 mcg by mouth daily.      . [DISCONTINUED] gabapentin (NEURONTIN) 300 MG capsule Take 2 capsules  (600 mg total) by mouth 3 (three) times daily.      . [DISCONTINUED] acetaminophen (TYLENOL) 325 MG tablet Take 2 tablets (650 mg total) by mouth every 6 (six) hours as needed.      . [DISCONTINUED] metoprolol succinate (TOPROL-XL) 25 MG 24 hr tablet Take 25 mg by mouth daily.       No facility-administered encounter medications on file as of 08/08/2013.    Allergies  Allergen Reactions  . Azithromycin Itching  . Macrodantin Itching  . Minocycline Hcl Itching  . Nitrofurantoin Itching  . Zithromax [Azithromycin Dihydrate] Itching  . Contrast Media [Iodinated Diagnostic Agents] Rash    Past Medical History  Diagnosis Date  . CAD (coronary artery disease)     a. s/p CABG 1995;  b. NSTEMI & subsequent BMS to SVG->right PDA 02/03/12.;  c. Cath 02/14/2012 3vd with 4/4 patent grafts and patent stent in vg->pda.  . Carotid stenosis     a. Carotid dopplers 2/13: RICA 40-59%, LICA 0-39%;  b. dopplers 3/14:  40-59% RICA, 0-39% LICA  . HTN (hypertension)   . HLD (hyperlipidemia)   . DM2 (diabetes mellitus, type 2)   . DJD (degenerative joint disease)   . Asthmatic bronchitis   . BPH (benign prostatic hypertrophy)   . Herpes zoster   . Anemia   .  Thrombocytopenia     a. secondary to splenomegaly related to lymphoma with suspicion of bone marrow involvement. (Plavix had to be stopped due to this)  . Aortic stenosis, moderate     a. Echo 02/14/12 EF 55%, Gr 2 DD, Mod AS (mean grad: 29, peak grad 52)  . Splenomegaly 04/13/2012  . History of blood transfusion 04/13/2012  . 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 Dermatitis 06/07/2012    acnieform rash on back 06/06/12  . COPD (chronic obstructive pulmonary disease)   . Peripheral vascular disease     a. s/p L ray amp 10/13;  b. s/p L 2nd toe amp 12/13  . Pulmonary nodule     8mm RUL calcified pulm nodule noted on CT staging for lymphoma - stable 10/2012.  . Leg DVT (deep venous  thromboembolism), chronic     LE dopplers 5/13, 10/13 and 1/14: chronic DVT involving right mid femoral vein, left mid femoral vein, and left popliteal vein.  . Osteomyelitis     L first foot ray amputation 09/2012  . Gangrene     L 2nd toe amputation 11/2012    ROS: Negative except as per HPI  BP 100/58  Pulse 51  Ht 6\' 1"  (1.854 m)  Wt 187 lb (84.823 kg)  BMI 24.68 kg/m2  SpO2 95%  PHYSICAL EXAM: Pt is alert and oriented, elderly gentleman in NAD HEENT: normal Neck: JVP - normal, carotids 2+= with bilateral bruits Lungs: CTA bilaterally CV: RRR with grade 3/6 harsh crescendo decrescendo murmur at the apex, A2 is intact Abd: soft, NT, Positive BS, no hepatomegaly Ext: no C/C/E, distal pulses intact and equal Skin: warm/dry no rash  EKG:  Sinus bradycardia with 1st degree AV block, occasional PAC, nonspecific IVCD  2D Echo 03/04/2013: Left ventricle: The cavity size was normal. Wall thickness was increased in a pattern of moderate LVH. Systolic function was normal. The estimated ejection fraction was in the range of 50% to 55%. Wall motion was normal; there were no regional wall motion abnormalities. Doppler parameters are consistent with abnormal left ventricular relaxation (grade 1 diastolic dysfunction).  ------------------------------------------------------------ Aortic valve: Moderately thickened, moderately calcified leaflets. Doppler: There was moderate stenosis. Moderate regurgitation. VTI ratio of LVOT to aortic valve: 0.37. Valve area: 1.53cm^2(VTI). Indexed valve area: 0.72cm^2/m^2 (VTI). Peak velocity ratio of LVOT to aortic valve: 0.36. Valve area: 1.48cm^2 (Vmax). Indexed valve area: 0.69cm^2/m^2 (Vmax). Mean velocity ratio of LVOT to aortic valve: 0.34. Valve area: 1.42cm^2 (Vmean). Indexed valve area: 0.67cm^2/m^2 (Vmean). Mean gradient: 28mm Hg (S). Peak gradient: 49mm Hg (S).  ------------------------------------------------------------ Aorta: The  aorta was normal, not dilated, and non-diseased.  ------------------------------------------------------------ Mitral valve: Calcified annulus. Doppler: Mild regurgitation. Peak gradient: 5mm Hg (D).  ------------------------------------------------------------ Left atrium: The atrium was moderately dilated.  ------------------------------------------------------------ Right ventricle: The cavity size was normal. Wall thickness was normal. Systolic function was normal.  ------------------------------------------------------------ Pulmonic valve: Doppler: Trivial regurgitation.  ------------------------------------------------------------ Tricuspid valve: Structurally normal valve. Leaflet separation was normal. Doppler: Transvalvular velocity was within the normal range. Mild regurgitation.  ------------------------------------------------------------ Right atrium: The atrium was mildly dilated.  ------------------------------------------------------------ Pericardium: The pericardium was normal in appearance.  ------------------------------------------------------------ Post procedure conclusions Ascending Aorta:  - The aorta was normal, not dilated, and non-diseased.  ASSESSMENT AND PLAN: 1. CAD s/p CABG and PCI. Current CCS Class 1. On appropriate Rx with ASA, statin drug, beta-blocker, and nitrate. Follow-up in 6 months.  2. Moderate aortic stenosis.  Appears asymptomatic at present. Follow-up echo in 6 months prior to his return visit.  3. Hyperlipdemia. Followed by Dr Eloise Harman. On pravastatin.  4. HTN - BP well-controlled on current Rx.  [name] 08/08/2013 11:01 AM

## 2013-08-08 NOTE — Patient Instructions (Addendum)
Your physician has requested that you have an echocardiogram in [facility] 2015. Echocardiography is a painless test that uses sound waves to create images of your heart. It provides your doctor with information about the size and shape of your heart and how well your heart's chambers and valves are working. This procedure takes approximately one hour. There are no restrictions for this procedure.  Your physician wants you to follow-up in: March 2015 with Dr Excell Seltzer.  You will receive a reminder letter in the mail two months in advance. If you don't receive a letter, please call our office to schedule the follow-up appointment.  Your physician recommends that you continue on your current medications as directed. Please refer to the Current Medication list given to you today.

## 2013-08-14 ENCOUNTER — Other Ambulatory Visit: Payer: Self-pay | Admitting: Oncology

## 2013-08-14 DIAGNOSIS — C8319 Mantle cell lymphoma, extranodal and solid organ sites: Secondary | ICD-10-CM

## 2013-08-28 ENCOUNTER — Other Ambulatory Visit: Payer: Self-pay | Admitting: Dermatology

## 2013-08-30 ENCOUNTER — Telehealth: Payer: Self-pay | Admitting: *Deleted

## 2013-08-30 NOTE — Telephone Encounter (Signed)
Wife called wanted to know Dr. Patsy Lager opinion on a flu shot for [name].  Discussed with Dr. Cyndie Chime - called her back and let her know he said it was fine.  Asked her to let us know the date that he has it done so we can document this in our system.

## 2013-09-12 ENCOUNTER — Inpatient Hospital Stay (HOSPITAL_COMMUNITY)
Admission: EM | Admit: 2013-09-12 | Discharge: 2013-09-18 | DRG: 988 | Disposition: A | Discharge status: Home Health | Payer: Medicare Other | Attending: Internal Medicine | Admitting: Internal Medicine

## 2013-09-12 ENCOUNTER — Encounter (HOSPITAL_COMMUNITY): Payer: Self-pay | Admitting: Emergency Medicine

## 2013-09-12 ENCOUNTER — Emergency Department (HOSPITAL_COMMUNITY): Payer: Medicare Other

## 2013-09-12 DIAGNOSIS — M7020 Olecranon bursitis, unspecified elbow: Secondary | ICD-10-CM | POA: Diagnosis present

## 2013-09-12 DIAGNOSIS — L309 Dermatitis, unspecified: Secondary | ICD-10-CM

## 2013-09-12 DIAGNOSIS — I252 Old myocardial infarction: Secondary | ICD-10-CM

## 2013-09-12 DIAGNOSIS — W19XXXA Unspecified fall, initial encounter: Secondary | ICD-10-CM | POA: Diagnosis present

## 2013-09-12 DIAGNOSIS — E871 Hypo-osmolality and hyponatremia: Secondary | ICD-10-CM

## 2013-09-12 DIAGNOSIS — I779 Disorder of arteries and arterioles, unspecified: Secondary | ICD-10-CM

## 2013-09-12 DIAGNOSIS — R600 Localized edema: Secondary | ICD-10-CM

## 2013-09-12 DIAGNOSIS — N400 Enlarged prostate without lower urinary tract symptoms: Secondary | ICD-10-CM

## 2013-09-12 DIAGNOSIS — I350 Nonrheumatic aortic (valve) stenosis: Secondary | ICD-10-CM

## 2013-09-12 DIAGNOSIS — D539 Nutritional anemia, unspecified: Secondary | ICD-10-CM

## 2013-09-12 DIAGNOSIS — M792 Neuralgia and neuritis, unspecified: Secondary | ICD-10-CM

## 2013-09-12 DIAGNOSIS — C8313 Mantle cell lymphoma, intra-abdominal lymph nodes: Secondary | ICD-10-CM

## 2013-09-12 DIAGNOSIS — R509 Fever, unspecified: Secondary | ICD-10-CM

## 2013-09-12 DIAGNOSIS — L03119 Cellulitis of unspecified part of limb: Secondary | ICD-10-CM

## 2013-09-12 DIAGNOSIS — R161 Splenomegaly, not elsewhere classified: Secondary | ICD-10-CM

## 2013-09-12 DIAGNOSIS — A4901 Methicillin susceptible Staphylococcus aureus infection, unspecified site: Secondary | ICD-10-CM | POA: Diagnosis present

## 2013-09-12 DIAGNOSIS — I2510 Atherosclerotic heart disease of native coronary artery without angina pectoris: Secondary | ICD-10-CM

## 2013-09-12 DIAGNOSIS — D696 Thrombocytopenia, unspecified: Secondary | ICD-10-CM | POA: Diagnosis present

## 2013-09-12 DIAGNOSIS — Z951 Presence of aortocoronary bypass graft: Secondary | ICD-10-CM

## 2013-09-12 DIAGNOSIS — E11621 Type 2 diabetes mellitus with foot ulcer: Secondary | ICD-10-CM

## 2013-09-12 DIAGNOSIS — IMO0002 Reserved for concepts with insufficient information to code with codable children: Principal | ICD-10-CM | POA: Diagnosis present

## 2013-09-12 DIAGNOSIS — E1149 Type 2 diabetes mellitus with other diabetic neurological complication: Secondary | ICD-10-CM

## 2013-09-12 DIAGNOSIS — C8319 Mantle cell lymphoma, extranodal and solid organ sites: Secondary | ICD-10-CM | POA: Diagnosis present

## 2013-09-12 DIAGNOSIS — D61818 Other pancytopenia: Secondary | ICD-10-CM

## 2013-09-12 DIAGNOSIS — F0390 Unspecified dementia without behavioral disturbance: Secondary | ICD-10-CM | POA: Diagnosis present

## 2013-09-12 DIAGNOSIS — A419 Sepsis, unspecified organism: Secondary | ICD-10-CM

## 2013-09-12 DIAGNOSIS — C8590 Non-Hodgkin lymphoma, unspecified, unspecified site: Secondary | ICD-10-CM

## 2013-09-12 DIAGNOSIS — E119 Type 2 diabetes mellitus without complications: Secondary | ICD-10-CM

## 2013-09-12 DIAGNOSIS — Z9289 Personal history of other medical treatment: Secondary | ICD-10-CM

## 2013-09-12 DIAGNOSIS — I10 Essential (primary) hypertension: Secondary | ICD-10-CM

## 2013-09-12 DIAGNOSIS — L0390 Cellulitis, unspecified: Secondary | ICD-10-CM | POA: Diagnosis present

## 2013-09-12 DIAGNOSIS — M7022 Olecranon bursitis, left elbow: Secondary | ICD-10-CM

## 2013-09-12 DIAGNOSIS — J984 Other disorders of lung: Secondary | ICD-10-CM

## 2013-09-12 DIAGNOSIS — I679 Cerebrovascular disease, unspecified: Secondary | ICD-10-CM

## 2013-09-12 DIAGNOSIS — S98139A Complete traumatic amputation of one unspecified lesser toe, initial encounter: Secondary | ICD-10-CM

## 2013-09-12 DIAGNOSIS — D649 Anemia, unspecified: Secondary | ICD-10-CM | POA: Diagnosis present

## 2013-09-12 DIAGNOSIS — M009 Pyogenic arthritis, unspecified: Secondary | ICD-10-CM | POA: Diagnosis present

## 2013-09-12 DIAGNOSIS — Z87891 Personal history of nicotine dependence: Secondary | ICD-10-CM

## 2013-09-12 DIAGNOSIS — E785 Hyperlipidemia, unspecified: Secondary | ICD-10-CM

## 2013-09-12 DIAGNOSIS — I739 Peripheral vascular disease, unspecified: Secondary | ICD-10-CM

## 2013-09-12 DIAGNOSIS — R079 Chest pain, unspecified: Secondary | ICD-10-CM

## 2013-09-12 DIAGNOSIS — R0989 Other specified symptoms and signs involving the circulatory and respiratory systems: Secondary | ICD-10-CM

## 2013-09-12 DIAGNOSIS — E869 Volume depletion, unspecified: Secondary | ICD-10-CM | POA: Diagnosis present

## 2013-09-12 DIAGNOSIS — C8310 Mantle cell lymphoma, unspecified site: Secondary | ICD-10-CM | POA: Diagnosis present

## 2013-09-12 DIAGNOSIS — R7881 Bacteremia: Secondary | ICD-10-CM

## 2013-09-12 DIAGNOSIS — R9431 Abnormal electrocardiogram [ECG] [EKG]: Secondary | ICD-10-CM

## 2013-09-12 DIAGNOSIS — J189 Pneumonia, unspecified organism: Secondary | ICD-10-CM

## 2013-09-12 DIAGNOSIS — I359 Nonrheumatic aortic valve disorder, unspecified: Secondary | ICD-10-CM | POA: Diagnosis present

## 2013-09-12 DIAGNOSIS — R609 Edema, unspecified: Secondary | ICD-10-CM | POA: Diagnosis present

## 2013-09-12 DIAGNOSIS — I96 Gangrene, not elsewhere classified: Secondary | ICD-10-CM

## 2013-09-12 LAB — COMPREHENSIVE METABOLIC PANEL
ALT: 11 U/L (ref 0–53)
Albumin: 3.4 g/dL — ABNORMAL LOW (ref 3.5–5.2)
Calcium: 8.9 mg/dL (ref 8.4–10.5)
GFR calc Af Amer: 52 mL/min — ABNORMAL LOW (ref >90)
Glucose, Bld: 214 mg/dL — ABNORMAL HIGH (ref 70–99)
Potassium: 4.3 mEq/L (ref 3.5–5.1)
Sodium: 124 mEq/L — ABNORMAL LOW (ref 135–145)
Total Bilirubin: 0.6 mg/dL (ref 0.3–1.2)
Total Protein: 6 g/dL (ref 6.0–8.3)

## 2013-09-12 LAB — CBC WITH DIFFERENTIAL/PLATELET
Basophils Absolute: 0 10*3/uL (ref 0.0–0.1)
Eosinophils Absolute: 0 10*3/uL (ref 0.0–0.7)
Lymphocytes Relative: 9 % — ABNORMAL LOW (ref 12–46)
MCHC: 34.7 g/dL (ref 30.0–36.0)
Monocytes Absolute: 1 10*3/uL (ref 0.1–1.0)
Monocytes Relative: 14 % — ABNORMAL HIGH (ref 3–12)
Neutro Abs: 5.6 10*3/uL (ref 1.7–7.7)
Neutrophils Relative %: 77 % (ref 43–77)
Platelets: 106 10*3/uL — ABNORMAL LOW (ref 150–400)
RDW: 14.8 % (ref 11.5–15.5)
WBC: 7.3 10*3/uL (ref 4.0–10.5)

## 2013-09-12 LAB — POCT I-STAT, CHEM 8
Calcium, Ion: 1.18 mmol/L (ref 1.13–1.30)
Chloride: 96 mEq/L (ref 96–112)
HCT: 32 % — ABNORMAL LOW (ref 39.0–52.0)
Hemoglobin: 10.9 g/dL — ABNORMAL LOW (ref 13.0–17.0)
Potassium: 4 mEq/L (ref 3.5–5.1)
Sodium: 128 mEq/L — ABNORMAL LOW (ref 135–145)

## 2013-09-12 LAB — GLUCOSE, CAPILLARY: Glucose-Capillary: 191 mg/dL — ABNORMAL HIGH (ref 70–99)

## 2013-09-12 MED ORDER — IBUPROFEN 200 MG PO TABS
600.0000 mg | ORAL_TABLET | Freq: Once | ORAL | Status: AC
  Administered 2013-09-12: 600 mg via ORAL
  Filled 2013-09-12: qty 3

## 2013-09-12 MED ORDER — SODIUM CHLORIDE 0.9 % IV BOLUS (SEPSIS)
500.0000 mL | Freq: Once | INTRAVENOUS | Status: AC
  Administered 2013-09-12: 500 mL via INTRAVENOUS

## 2013-09-12 MED ORDER — VANCOMYCIN HCL 10 G IV SOLR
1250.0000 mg | Freq: Once | INTRAVENOUS | Status: AC
  Administered 2013-09-12: 1250 mg via INTRAVENOUS
  Filled 2013-09-12: qty 1250

## 2013-09-12 NOTE — ED Notes (Addendum)
Pt reports he fell on Saturday morning and hit his elbow on a coffee table. Pt reports redness, swelling in left arm. Also reports fevers. (Pt reports being seen by orthopedic physician, and given antibiotics/pain meds. Pt to have recheck on Friday for the same)

## 2013-09-12 NOTE — ED Notes (Signed)
EDP with patient for assessment at this time

## 2013-09-12 NOTE — ED Notes (Signed)
Patient transported to X-ray 

## 2013-09-12 NOTE — ED Provider Notes (Signed)
CSN: [id number]     Arrival date & time 09/12/13  2045 History   First MD Initiated Contact with Patient 09/12/13 2136     Chief Complaint  Patient presents with  . Fall   (Consider location/radiation/quality/duration/timing/severity/associated sxs/prior Treatment) Patient is a 79 y.o. male presenting with fall. The history is provided by the patient.  Fall This is a new problem. Episode onset: 4 days ago. Episode frequency: once. The problem has been resolved. Pertinent negatives include no chest pain, no abdominal pain, no headaches and no shortness of breath. Nothing aggravates the symptoms. Nothing relieves the symptoms. Treatments tried: bactrim. The treatment provided no relief.    Past Medical History  Diagnosis Date  . CAD (coronary artery disease)     a. s/p CABG 1995;  b. NSTEMI & subsequent BMS to SVG->right PDA 02/03/12.;  c. Cath 02/14/2012 3vd with 4/4 patent grafts and patent stent in vg->pda.  . Carotid stenosis     a. Carotid dopplers 2/13: RICA 40-59%, LICA 0-39%;  b. dopplers 3/14:  40-59% RICA, 0-39% LICA  . HTN (hypertension)   . HLD (hyperlipidemia)   . DM2 (diabetes mellitus, type 2)   . DJD (degenerative joint disease)   . Asthmatic bronchitis   . BPH (benign prostatic hypertrophy)   . Herpes zoster   . Anemia   . Thrombocytopenia     a. secondary to splenomegaly related to lymphoma with suspicion of bone marrow involvement. (Plavix had to be stopped due to this)  . Aortic stenosis, moderate     a. Echo 02/14/12 EF 55%, Gr 2 DD, Mod AS (mean grad: 29, peak grad 52)  . Splenomegaly 04/13/2012  . History of blood transfusion 04/13/2012  . 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 Dermatitis 06/07/2012    acnieform rash on back 06/06/12  . COPD (chronic obstructive pulmonary disease)   . Peripheral vascular disease     a. s/p L ray amp 10/13;  b. s/p L 2nd toe amp 12/13  . Pulmonary nodule     8mm  RUL calcified pulm nodule noted on CT staging for lymphoma - stable 10/2012.  . Leg DVT (deep venous thromboembolism), chronic     LE dopplers 5/13, 10/13 and 1/14: chronic DVT involving right mid femoral vein, left mid femoral vein, and left popliteal vein.  . Osteomyelitis     L first foot ray amputation 09/2012  . Gangrene     L 2nd toe amputation 11/2012   Past Surgical History  Procedure Laterality Date  . Coronary artery bypass graft  1995  . Rotator cuff repair    . Carpal tunnel release    . Incisional hernia repair    . Kidney surgery    . Back surgery    . I&d extremity  09/16/2012    Procedure: IRRIGATION AND DEBRIDEMENT EXTREMITY;  Surgeon: [name], MD;  Location: [facility];  Service: Orthopedics;  Laterality: Left;  irrigation and debridement and first Ray amputation of left foot  . Amputation  09/16/2012    Procedure: AMPUTATION RAY;  Surgeon: [name], MD;  Location: [facility];  Service: Orthopedics;  Laterality: Left;  first Ray amputation left foot  . Amputation  11/21/2012    Procedure: AMPUTATION RAY;  Surgeon: [name], MD;  Location: [facility] OR;  Service: Orthopedics;  Laterality: Left;  LEFT SECOND TOE AMPUTATION  . I&d extremity Left 03/01/2013    Procedure: IRRIGATION  AND DEBRIDEMENT EXTREMITY;  Surgeon: [name], MD;  Location: [facility];  Service: Orthopedics;  Laterality: Left;  IRRIGATION  AND  DEBRIDEMENT  LEFT  FOOT   Family History  Problem Relation Age of Onset  . Coronary artery disease    . Alcohol abuse     History  Substance Use Topics  . Smoking status: Former Smoker -- 1.00 packs/day for 30 years    Types: Cigarettes, Pipe    Quit date: 03/29/1985  . Smokeless tobacco: Never Used     Comment: quit in 1986  . Alcohol Use: No    Review of Systems  Constitutional: Positive for fever.  HENT: Negative for drooling and rhinorrhea.   Eyes: Negative for pain.  Respiratory: Negative for cough and shortness of breath.   Cardiovascular: Negative  for chest pain and leg swelling.  Gastrointestinal: Negative for nausea, vomiting, abdominal pain and diarrhea.  Genitourinary: Negative for dysuria and hematuria.  Musculoskeletal: Negative for gait problem and neck pain.  Skin: Positive for color change.  Neurological: Negative for numbness and headaches.  Hematological: Negative for adenopathy.  Psychiatric/Behavioral: Negative for behavioral problems.  All other systems reviewed and are negative.    Allergies  Azithromycin; Doxycycline; Macrodantin; Minocycline hcl; Nitrofurantoin; Zithromax; and Contrast media  Home Medications   Current Outpatient Rx  Name  Route  Sig  Dispense  Refill  . acyclovir (ZOVIRAX) 400 MG tablet      TAKE ONE TABLET BY MOUTH TWICE DAILY TO  PREVENT  VIRUS  INFECTION   60 tablet   2   . aspirin EC 81 MG tablet   Oral   Take 81 mg by mouth every morning.          . gabapentin (NEURONTIN) 300 MG capsule   Oral   Take 300 mg by mouth at bedtime.          . isosorbide mononitrate (IMDUR) 30 MG 24 hr tablet      TAKE ONE TABLET BY MOUTH EVERY DAY   90 tablet   1   . losartan (COZAAR) 100 MG tablet   Oral   Take 1 tablet by mouth daily.          . meloxicam (MOBIC) 15 MG tablet   Oral   Take 15 mg by mouth daily.         . metFORMIN (GLUCOPHAGE) 500 MG tablet   Oral   Take 500 mg by mouth 2 (two) times daily with a meal.          . metoprolol tartrate (LOPRESSOR) 25 MG tablet      TAKE 1/2 TABLET TWICE DAILY         . ondansetron (ZOFRAN-ODT) 8 MG disintegrating tablet   Sublingual   Place 8 mg under the tongue every 8 (eight) hours as needed. For nausea.         . pravastatin (PRAVACHOL) 40 MG tablet   Oral   Take 40 mg by mouth daily.         Marland Kitchen sulfamethoxazole-trimethoprim (BACTRIM DS) 800-160 MG per tablet   Oral   Take 1 tablet by mouth 2 (two) times daily.         . traMADol (ULTRAM) 50 MG tablet   Oral   Take 50 mg by mouth every 6 (six) hours as  needed for pain.         . vitamin B-12 (CYANOCOBALAMIN) 500 MCG tablet   Oral   Take 1,000 mcg  by mouth daily.           BP 155/57  Pulse 73  Temp(Src) 102.4 F (39.1 C) (Oral)  Resp 18  Ht 6\' 1"  (1.854 m)  Wt 185 lb (83.915 kg)  BMI 24.41 kg/m2  SpO2 92% Physical Exam  Nursing note and vitals reviewed. Constitutional: He is oriented to person, place, and time. He appears well-developed and well-nourished.  HENT:  Head: Normocephalic and atraumatic.  Right Ear: External ear normal.  Left Ear: External ear normal.  Nose: Nose normal.  Mouth/Throat: Oropharynx is clear and moist. No oropharyngeal exudate.  Eyes: Conjunctivae and EOM are normal. Pupils are equal, round, and reactive to light.  Neck: Normal range of motion. Neck supple.  Cardiovascular: Normal rate, regular rhythm, normal heart sounds and intact distal pulses.  Exam reveals no gallop and no friction rub.   No murmur heard. Pulmonary/Chest: Effort normal and breath sounds normal. No respiratory distress. He has no wheezes.  Abdominal: Soft. Bowel sounds are normal. He exhibits no distension. There is no tenderness. There is no rebound and no guarding.  Musculoskeletal: Normal range of motion. He exhibits no edema and no tenderness.  Erythema to left elbow and forearm.  Left olecranon bursa is erythematous and ballotable.  Shallow wound on dorsal aspect of left mid forearm.     Neurological: He is alert and oriented to person, place, and time.  Skin: Skin is warm and dry.  Small pustular lesion to right anterior shin.   Psychiatric: He has a normal mood and affect. His behavior is normal.    ED Course  Procedures (including critical care time) Labs Review Labs Reviewed  CBC WITH DIFFERENTIAL - Abnormal; Notable for the following:    RBC 3.15 (*)    Hemoglobin 10.7 (*)    HCT 30.8 (*)    Platelets 106 (*)    Lymphocytes Relative 9 (*)    Monocytes Relative 14 (*)    All other components within  normal limits  COMPREHENSIVE METABOLIC PANEL - Abnormal; Notable for the following:    Sodium 124 (*)    Chloride 91 (*)    Glucose, Bld 214 (*)    BUN 26 (*)    Creatinine, Ser 1.39 (*)    Albumin 3.4 (*)    Alkaline Phosphatase 132 (*)    GFR calc non Af Amer 45 (*)    GFR calc Af Amer 52 (*)    All other components within normal limits  GLUCOSE, CAPILLARY - Abnormal; Notable for the following:    Glucose-Capillary 191 (*)    All other components within normal limits  POCT I-STAT, CHEM 8 - Abnormal; Notable for the following:    Sodium 128 (*)    BUN 26 (*)    Creatinine, Ser 1.70 (*)    Glucose, Bld 209 (*)    Hemoglobin 10.9 (*)    HCT 32.0 (*)    All other components within normal limits  CULTURE, BLOOD (ROUTINE X 2)  CULTURE, BLOOD (ROUTINE X 2)  URINALYSIS, ROUTINE W REFLEX MICROSCOPIC   Imaging Review Dg Chest 2 View  09/12/2013   CLINICAL DATA:  Short of breath, cough  EXAM: CHEST  2 VIEW  COMPARISON:  Prior chest x-ray 02/28/2013; Prior CT chest 07/02/2013  FINDINGS: Stable cardiomegaly. Status post median sternotomy with evidence of multivessel CABG including renal bypass. Atherosclerotic calcifications in the transverse aorta. Chronic central bronchitic changes and interstitial prominence. No focal airspace consolidation, pleural effusion or pneumothorax. No  acute osseous abnormality.  IMPRESSION: Stable chest x-ray. No active cardiopulmonary disease.   Electronically Signed   By: Malachy Moan M.D.   On: 09/12/2013 22:50    MDM   1. Cellulitis   2. Olecranon bursitis, left   3. Hyponatremia    10:02 PM 79 y.o. male who presents with fever and left arm pain and redness. He reports that he had a mechanical fall onto the left arm 4 days ago. He denies hitting his head or loss of consciousness. He denies any pain initially but noticed that pain developed 2 days later. Patient states that redness of the left arm began 2 days ago and he developed a fever yesterday.  The patient was evaluated by his orthopedist Dr. Penni Bombard who was suspicious for olecranon bursitis. The patient was sent home on Bactrim. The patient is febrile here, he has erythema to the left elbow and left forearm consistent with cellulitis, and fever as well. He is otherwise well appearing and interactive on exam. Will get lab work and discuss the case with degrees per orthopedics.  Discussed case w/ [facility] on call who does not recommend arthrocentesis of olecranon bursa at this time. Will tx for cellulitis w/ vanc. Consulted [facility] for admission.     Junius Argyle, MD 09/13/13 [phone]

## 2013-09-12 NOTE — ED Notes (Signed)
Notified RN,Kelly pt. CBG 191.

## 2013-09-12 NOTE — ED Notes (Signed)
Pt. Attempted urinating in urinal but unsuccessful. Will continue to monitor.

## 2013-09-13 LAB — COMPREHENSIVE METABOLIC PANEL
ALT: 9 U/L (ref 0–53)
Albumin: 2.9 g/dL — ABNORMAL LOW (ref 3.5–5.2)
Alkaline Phosphatase: 112 U/L (ref 39–117)
BUN: 28 mg/dL — ABNORMAL HIGH (ref 6–23)
CO2: 20 mEq/L (ref 19–32)
Calcium: 8.4 mg/dL (ref 8.4–10.5)
GFR calc Af Amer: 46 mL/min — ABNORMAL LOW (ref >90)
GFR calc non Af Amer: 40 mL/min — ABNORMAL LOW (ref >90)
Glucose, Bld: 141 mg/dL — ABNORMAL HIGH (ref 70–99)
Potassium: 4.2 mEq/L (ref 3.5–5.1)
Total Protein: 5.3 g/dL — ABNORMAL LOW (ref 6.0–8.3)

## 2013-09-13 LAB — GLUCOSE, CAPILLARY: Glucose-Capillary: 112 mg/dL — ABNORMAL HIGH (ref 70–99)

## 2013-09-13 LAB — URINALYSIS, ROUTINE W REFLEX MICROSCOPIC
Bilirubin Urine: NEGATIVE
Glucose, UA: NEGATIVE mg/dL
Leukocytes, UA: NEGATIVE
Protein, ur: 30 mg/dL — AB
Urobilinogen, UA: 0.2 mg/dL (ref 0.0–1.0)
pH: 5.5 (ref 5.0–8.0)

## 2013-09-13 LAB — CBC
HCT: 28.7 % — ABNORMAL LOW (ref 39.0–52.0)
Hemoglobin: 9.8 g/dL — ABNORMAL LOW (ref 13.0–17.0)
MCH: 33.8 pg (ref 26.0–34.0)
MCHC: 34.1 g/dL (ref 30.0–36.0)
Platelets: 89 10*3/uL — ABNORMAL LOW (ref 150–400)
RDW: 14.8 % (ref 11.5–15.5)
WBC: 5.5 10*3/uL (ref 4.0–10.5)

## 2013-09-13 LAB — URINE MICROSCOPIC-ADD ON

## 2013-09-13 MED ORDER — ACETAMINOPHEN 325 MG PO TABS
650.0000 mg | ORAL_TABLET | Freq: Four times a day (QID) | ORAL | Status: DC | PRN
  Administered 2013-09-13 – 2013-09-14 (×2): 650 mg via ORAL
  Filled 2013-09-13 (×2): qty 2

## 2013-09-13 MED ORDER — ASPIRIN EC 81 MG PO TBEC
81.0000 mg | DELAYED_RELEASE_TABLET | Freq: Every day | ORAL | Status: DC
  Administered 2013-09-13 – 2013-09-18 (×6): 81 mg via ORAL
  Filled 2013-09-13 (×6): qty 1

## 2013-09-13 MED ORDER — ACETAMINOPHEN 650 MG RE SUPP: 650.0000 mg | Freq: Four times a day (QID) | RECTAL | Status: DC | PRN

## 2013-09-13 MED ORDER — ACYCLOVIR 400 MG PO TABS
400.0000 mg | ORAL_TABLET | Freq: Two times a day (BID) | ORAL | Status: DC
  Administered 2013-09-13 – 2013-09-18 (×11): 400 mg via ORAL
  Filled 2013-09-13 (×13): qty 1

## 2013-09-13 MED ORDER — TRAMADOL HCL 50 MG PO TABS
50.0000 mg | ORAL_TABLET | Freq: Four times a day (QID) | ORAL | Status: DC | PRN
  Administered 2013-09-13 – 2013-09-18 (×12): 50 mg via ORAL
  Filled 2013-09-13 (×12): qty 1

## 2013-09-13 MED ORDER — VANCOMYCIN HCL IN DEXTROSE 1-5 GM/200ML-% IV SOLN
1000.0000 mg | INTRAVENOUS | Status: DC
  Administered 2013-09-13: 1000 mg via INTRAVENOUS
  Filled 2013-09-13: qty 200

## 2013-09-13 MED ORDER — ONDANSETRON 8 MG PO TBDP
8.0000 mg | ORAL_TABLET | Freq: Three times a day (TID) | ORAL | Status: DC | PRN
  Filled 2013-09-13: qty 1

## 2013-09-13 MED ORDER — SIMVASTATIN 20 MG PO TABS
20.0000 mg | ORAL_TABLET | Freq: Every day | ORAL | Status: DC
  Administered 2013-09-14 – 2013-09-17 (×4): 20 mg via ORAL
  Filled 2013-09-13 (×6): qty 1

## 2013-09-13 MED ORDER — MELOXICAM 15 MG PO TABS
15.0000 mg | ORAL_TABLET | Freq: Every day | ORAL | Status: DC
  Filled 2013-09-13: qty 1

## 2013-09-13 MED ORDER — ENOXAPARIN SODIUM 40 MG/0.4ML ~~LOC~~ SOLN
40.0000 mg | SUBCUTANEOUS | Status: DC
  Administered 2013-09-13 – 2013-09-18 (×6): 40 mg via SUBCUTANEOUS
  Filled 2013-09-13 (×6): qty 0.4

## 2013-09-13 MED ORDER — INSULIN ASPART 100 UNIT/ML ~~LOC~~ SOLN
0.0000 [IU] | Freq: Three times a day (TID) | SUBCUTANEOUS | Status: DC
  Administered 2013-09-14 (×2): 1 [IU] via SUBCUTANEOUS
  Administered 2013-09-15: 12:00:00 2 [IU] via SUBCUTANEOUS
  Administered 2013-09-16 – 2013-09-18 (×5): 1 [IU] via SUBCUTANEOUS

## 2013-09-13 MED ORDER — VITAMIN B-12 1000 MCG PO TABS
1000.0000 ug | ORAL_TABLET | Freq: Every day | ORAL | Status: DC
  Administered 2013-09-13 – 2013-09-18 (×6): 1000 ug via ORAL
  Filled 2013-09-13 (×6): qty 1

## 2013-09-13 MED ORDER — POTASSIUM CHLORIDE IN NACL 20-0.9 MEQ/L-% IV SOLN
INTRAVENOUS | Status: DC
  Administered 2013-09-13 – 2013-09-17 (×6): via INTRAVENOUS
  Filled 2013-09-13 (×9): qty 1000

## 2013-09-13 MED ORDER — ISOSORBIDE MONONITRATE ER 30 MG PO TB24
30.0000 mg | ORAL_TABLET | Freq: Every day | ORAL | Status: DC
  Administered 2013-09-13 – 2013-09-18 (×6): 30 mg via ORAL
  Filled 2013-09-13 (×6): qty 1

## 2013-09-13 MED ORDER — SODIUM CHLORIDE 0.9 % IV SOLN
INTRAVENOUS | Status: DC
  Administered 2013-09-13: 02:00:00 via INTRAVENOUS

## 2013-09-13 MED ORDER — LOSARTAN POTASSIUM 50 MG PO TABS
100.0000 mg | ORAL_TABLET | Freq: Every day | ORAL | Status: DC
  Filled 2013-09-13: qty 2

## 2013-09-13 MED ORDER — GABAPENTIN 300 MG PO CAPS
300.0000 mg | ORAL_CAPSULE | Freq: Every day | ORAL | Status: DC
  Administered 2013-09-13 – 2013-09-17 (×5): 300 mg via ORAL
  Filled 2013-09-13 (×7): qty 1

## 2013-09-13 MED ORDER — MORPHINE SULFATE 2 MG/ML IJ SOLN
1.0000 mg | INTRAMUSCULAR | Status: DC | PRN
  Administered 2013-09-14 – 2013-09-18 (×7): 1 mg via INTRAVENOUS
  Filled 2013-09-13 (×8): qty 1

## 2013-09-13 MED ORDER — METOPROLOL TARTRATE 12.5 MG HALF TABLET
12.5000 mg | ORAL_TABLET | Freq: Two times a day (BID) | ORAL | Status: DC
  Administered 2013-09-13 – 2013-09-18 (×10): 12.5 mg via ORAL
  Filled 2013-09-13 (×12): qty 1

## 2013-09-13 NOTE — ED Notes (Signed)
Admitting MD with patinet at this time

## 2013-09-13 NOTE — Progress Notes (Signed)
VASCULAR LAB PRELIMINARY  PRELIMINARY  PRELIMINARY  PRELIMINARY   Left upper extremity venous duplex completed.    Preliminary report:  Left:  No evidence of DVT or superficial thrombosis.    Oakleigh Hesketh, RVT 09/13/2013, 9:09 AM

## 2013-09-13 NOTE — H&P (Signed)
PCP:   Garlan Fillers, MD   Chief Complaint:  Fever left arm pain  HPI: This is a pleasant elderly Caucasian male who has an extensive past medical history for coronary artery disease, vascular disease, diabetes with vascular complications, neuropathic complications, and Mantle cell lymphoma in partial remission being treated by oncology and continues to get chemotherapy, last dose in August with next dose scheduled for next month. Notes from Dr. Excell Seltzer and Dr. Cyndie Chime from the month of August and September have been reviewed. This is actually a fairly well compensated individual, indeed he has been mowing his yard with both a riding lawnmower as well as a push mower approximately one week ago. The patient does have some short-term memory deficits, does need some supervision per the wife and daughter who accompany the patient today in the emergency room. He had a fall early on Saturday morning while in the stent, striking his left forearm. He was asymptomatic after the fall for one-2 days, on Monday he did have 3 lesions removed by dermatology and including one from the left forearm and 2 from the face. The area on the forearm did look somewhat infected, and was observed by dermatology to per family report recommended topical antibiotic treatment and cleansing. However on Monday the patient developed progressive pain in that forearm, and was seen by orthopedics yesterday, thought to have a mild cellulitis as well as an olecranon bursitis, initially given doxycycline, but did not tolerate this secondary to history and was transferred over to sulfa antibiotics, which were started last night however this was in conjunction with worsening fevers over 101-102F. Patient resisted further evaluation however was convinced to come to the emergency room this evening by wife and daughter, and noted to have worsening erythema consistent with a cellulitis extending from the mid forearm all way into the elbow with  some painful movement on range of motion. Orthopedics was called however they deferred aggressive intervention with incision and drainage at this time by telephone with the emergency room physician, and we were called to admit the patient for presumed cellulitis that is possibly refractory to oral antibiotics in conjunction with high fever and the need for IV antibiotics given multiple comorbidities. Review of Systems:  Positive for fevers, chills within the last 24 hours but negative for headache, new visual abnormalities, swallowing difficulties, recurrent cough, shortness of breath, chest pain, palpitations, presyncope, nausea, vomiting, change in bowel habits however has had some issues with difficulty in producing urine in the emergency room but denies recent dysuria, gross hematuria. Denies lower extremity edema but does note and admits to chronic issues with paresthesias in the lower extremities secondary to diabetic neuropathy. He is status post multiple surgeries, toe amputation, modification, by Dr. Victorino Dike but denies any significant issues otherwise at this time. And indeed he is quite ambulatory at baseline. Denies any other new focal neurologic deficit  Past Medical History: Past Medical History  Diagnosis Date  . CAD (coronary artery disease)     a. s/p CABG 1995;  b. NSTEMI & subsequent BMS to SVG->right PDA 02/03/12.;  c. Cath 02/14/2012 3vd with 4/4 patent grafts and patent stent in vg->pda.  . Carotid stenosis     a. Carotid dopplers 2/13: RICA 40-59%, LICA 0-39%;  b. dopplers 3/14:  40-59% RICA, 0-39% LICA  . HTN (hypertension)   . HLD (hyperlipidemia)   . DM2 (diabetes mellitus, type 2)   . DJD (degenerative joint disease)   . Asthmatic bronchitis   . BPH (benign  prostatic hypertrophy)   . Herpes zoster   . Anemia   . Thrombocytopenia     a. secondary to splenomegaly related to lymphoma with suspicion of bone marrow involvement. (Plavix had to be stopped due to this)  . Aortic  stenosis, moderate     a. Echo 02/14/12 EF 55%, Gr 2 DD, Mod AS (mean grad: 29, peak grad 52)  . Splenomegaly 04/13/2012  . History of blood transfusion 04/13/2012  . 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 Dermatitis 06/07/2012    acnieform rash on back 06/06/12  . COPD (chronic obstructive pulmonary disease)   . Peripheral vascular disease     a. s/p L ray amp 10/13;  b. s/p L 2nd toe amp 12/13  . Pulmonary nodule     8mm RUL calcified pulm nodule noted on CT staging for lymphoma - stable 10/2012.  . Leg DVT (deep venous thromboembolism), chronic     LE dopplers 5/13, 10/13 and 1/14: chronic DVT involving right mid femoral vein, left mid femoral vein, and left popliteal vein.  . Osteomyelitis     L first foot ray amputation 09/2012  . Gangrene     L 2nd toe amputation 11/2012   Past Surgical History  Procedure Laterality Date  . Coronary artery bypass graft  1995  . Rotator cuff repair    . Carpal tunnel release    . Incisional hernia repair    . Kidney surgery    . Back surgery    . I&d extremity  09/16/2012    Procedure: IRRIGATION AND DEBRIDEMENT EXTREMITY;  Surgeon: [name], MD;  Location: [facility];  Service: Orthopedics;  Laterality: Left;  irrigation and debridement and first Ray amputation of left foot  . Amputation  09/16/2012    Procedure: AMPUTATION RAY;  Surgeon: [name], MD;  Location: [facility];  Service: Orthopedics;  Laterality: Left;  first Ray amputation left foot  . Amputation  11/21/2012    Procedure: AMPUTATION RAY;  Surgeon: [name], MD;  Location: [facility] OR;  Service: Orthopedics;  Laterality: Left;  LEFT SECOND TOE AMPUTATION  . I&d extremity Left 03/01/2013    Procedure: IRRIGATION AND DEBRIDEMENT EXTREMITY;  Surgeon: [name], MD;  Location: [facility];  Service: Orthopedics;  Laterality: Left;  IRRIGATION  AND  DEBRIDEMENT  LEFT  FOOT    Medications: Prior to Admission medications    Medication Sig Start Date End Date Taking? Authorizing Provider  acyclovir (ZOVIRAX) 400 MG tablet TAKE ONE TABLET BY MOUTH TWICE DAILY TO  PREVENT  VIRUS  INFECTION 08/14/13  Yes Levert Feinstein, MD  aspirin EC 81 MG tablet Take 81 mg by mouth every morning.    Yes Historical Provider, MD  gabapentin (NEURONTIN) 300 MG capsule Take 300 mg by mouth at bedtime.  03/03/13  Yes Gaspar Garbe, MD  isosorbide mononitrate (IMDUR) 30 MG 24 hr tablet TAKE ONE TABLET BY MOUTH EVERY DAY 07/19/13  Yes Gaylord Shih, MD  losartan (COZAAR) 100 MG tablet Take 1 tablet by mouth daily.  10/17/11  Yes Historical Provider, MD  meloxicam (MOBIC) 15 MG tablet Take 15 mg by mouth daily.   Yes Historical Provider, MD  metFORMIN (GLUCOPHAGE) 500 MG tablet Take 500 mg by mouth 2 (two) times daily with a meal.    Yes Historical Provider, MD  metoprolol tartrate (LOPRESSOR) 25 MG tablet TAKE 1/2 TABLET TWICE DAILY 07/24/13  Yes Historical  Provider, MD  ondansetron (ZOFRAN-ODT) 8 MG disintegrating tablet Place 8 mg under the tongue every 8 (eight) hours as needed. For nausea. 04/13/12  Yes Historical Provider, MD  pravastatin (PRAVACHOL) 40 MG tablet Take 40 mg by mouth daily.   Yes Historical Provider, MD  sulfamethoxazole-trimethoprim (BACTRIM DS) 800-160 MG per tablet Take 1 tablet by mouth 2 (two) times daily. 09/11/13 09/18/13 Yes Historical Provider, MD  traMADol (ULTRAM) 50 MG tablet Take 50 mg by mouth every 6 (six) hours as needed for pain.   Yes Historical Provider, MD  vitamin B-12 (CYANOCOBALAMIN) 500 MCG tablet Take 1,000 mcg by mouth daily.    Yes Historical Provider, MD    Allergies:   Allergies  Allergen Reactions  . Azithromycin Itching  . Doxycycline   . Macrodantin Itching  . Minocycline Hcl Itching  . Nitrofurantoin Itching  . Zithromax [Azithromycin Dihydrate] Itching  . Contrast Media [Iodinated Diagnostic Agents] Rash    Social History:  reports that he quit smoking about 28 years ago.  His smoking use included Cigarettes and Pipe. He has a 30 pack-year smoking history. He has never used smokeless tobacco. He reports that he does not drink alcohol or use illicit drugs.  Family History: Family History  Problem Relation Age of Onset  . Coronary artery disease    . Alcohol abuse      Physical Exam: Filed Vitals:   09/12/13 2254 09/12/13 2300 09/12/13 2305 09/13/13 0012  BP: 128/49   114/48  Pulse: 57 66 84 66  Temp: 99.7 F (37.6 C)   98.9 F (37.2 C)  TempSrc: Oral   Oral  Resp: 15 19 16 18   Height:      Weight:      SpO2: 95% 91% 93% 98%   General appearance, pleasant, answering questions semicircular but with some short-term memory deficits, Encouraged by , no respiratory distress Sclera anicteric extraocular movements are intact, face symmetrical No oropharyngeal lesions No facial erythema Neck supple, no thyromegaly, carotid bruits are appreciated, next line lungs are clear to auscultation bilaterally except for some minor rhonchi diffuse with no respiratory distress Cardiovascular reveals regular rate and rhythm with harsh systolic murmur Abdominal exam reveals a soft nontender nondistended abdomen bowel sounds are present, no splenomegaly is appreciated Extremity exam reveals no edema, distal pulses are intact but diminished, no cyanosis appreciated Skin exam reveals erythema extending from the midportion of the left forearm around to the elbow, somewhat warm to the touch, with significant scaly appearance, there is a Band-Aid covering a shallow ulcer where patient recently had skin biopsy, with some.. purulent discharge. There is a pustular type lesion on the medial aspect of the right shin. No discharge at this point. Patient can move all 4 extremities, tone intact, no resting tremors, ambulation not tested, follows commands, alert and oriented to person place and situation    Labs on Admission:   Recent Labs  09/12/13 2140 09/12/13 2158  NA 124* 128*   K 4.3 4.0  CL 91* 96  CO2 22  --   GLUCOSE 214* 209*  BUN 26* 26*  CREATININE 1.39* 1.70*  CALCIUM 8.9  --     Recent Labs  09/12/13 2140  AST 14  ALT 11  ALKPHOS 132*  BILITOT 0.6  PROT 6.0  ALBUMIN 3.4*   No results found for this basename: LIPASE, AMYLASE,  in the last 72 hours  Recent Labs  09/12/13 2140 09/12/13 2158  WBC 7.3  --   NEUTROABS 5.6  --  HGB 10.7* 10.9*  HCT 30.8* 32.0*  MCV 97.8  --   PLT 106*  --    No results found for this basename: CKTOTAL, CKMB, CKMBINDEX, TROPONINI,  in the last 72 hours No results found for this basename: TSH, T4TOTAL, FREET3, T3FREE, THYROIDAB,  in the last 72 hours No results found for this basename: VITAMINB12, FOLATE, FERRITIN, TIBC, IRON, RETICCTPCT,  in the last 72 hours  Radiological Exams on Admission: Dg Chest 2 View  09/12/2013   CLINICAL DATA:  Short of breath, cough  EXAM: CHEST  2 VIEW  COMPARISON:  Prior chest x-ray 02/28/2013; Prior CT chest 07/02/2013  FINDINGS: Stable cardiomegaly. Status post median sternotomy with evidence of multivessel CABG including renal bypass. Atherosclerotic calcifications in the transverse aorta. Chronic central bronchitic changes and interstitial prominence. No focal airspace consolidation, pleural effusion or pneumothorax. No acute osseous abnormality.  IMPRESSION: Stable chest x-ray. No active cardiopulmonary disease.   Electronically Signed   By: Malachy Moan M.D.   On: 09/12/2013 22:50   Orders placed in visit on 08/08/13  . EKG 12-LEAD    Assessment/Plan Left upper extremity cellulitis-unclear etiology whether this is related to the olecranon bursitis with some painful range of motion versus recently administered skin biopsy in close proximity, will treat with vancomycin it appears that the patient has improved symptomatically since being in the emergency room, we'll continue the same however may need to broaden antibiotics if cellulitis worsens Olecranon  bursitis-followed by orthopedics, may need orthopedic consult if range of motion, swelling worsen, seen by Dr. Penni Bombard [facility] for orthopedics earlier this week Skin lesion-recent skin lesions removed, with possible culprit lesion on the left forearm inciting occult cellulitis, white blood cell count is normal, will treat with IV antibiotic Coronary artery disease-notes from cardiology recently reviewed, patient clearly does not have any evidence of volume overload on physical exam or decompensation, last note from cardiology within the last one-2 months reviewed Peripheral vascular disease-stable with no active cyanosis on physical exam at this time, we'll continue to monitor Type 2 diabetes-we'll hold metformin given hospitalization, provide sliding scale insulin and adjust as indicated Hyponatremia-new diagnosis we'll provide normal saline IV fluids, recheck in the morning, possibly complicated by volume depletion with rising creatinine that is ever so slight, will hold NSAIDs Mantle cell lymphoma-last note from oncology reviewed, last chemotherapy per family report back in August with second set of chemotherapy scheduled next month, white blood cell count is normal, with mild anemia and thrombocytopenia. Mild dementia-did discuss with family the possibility of sundowning, labile behavior once family left however they stated that during multiple hospitalizations he has never exhibited this behavior.   Darrien Belter R 09/13/2013, 12:15 AM

## 2013-09-13 NOTE — Consult Note (Signed)
Reason for Consult:Left arm cellulitis Referring Physician: Dr Dossie Arbour  ESLI [name] is an 79 y.o. male.  HPI: 79 yo male who presented to ED last night with swollen red elbow and arm. He had a biopsy of a skin lesion of his left forearm last week and developed redness around it in past few days. He had a fall over the weekend and landed on his left elbow. He saw Dr. Rodolph Bong in our office 2 days ago and did not have any bony injury. He started the patient on doxycyline, which was not tolerated, then switched to Bactrim. Yesterday the patient devleped high fevers and decreased urinary output. His wife took him to the [facility] ED and he was admitted for IV antibiotics. He denies any pain in his left arm/elbow but does have stiffness due to the swelling. He has not had any drainage form the forearm/elbow.  Past Medical History  Diagnosis Date  . CAD (coronary artery disease)     a. s/p CABG 1995;  b. NSTEMI & subsequent BMS to SVG->right PDA 02/03/12.;  c. Cath 02/14/2012 3vd with 4/4 patent grafts and patent stent in vg->pda.  . Carotid stenosis     a. Carotid dopplers 2/13: RICA 40-59%, LICA 0-39%;  b. dopplers 3/14:  40-59% RICA, 0-39% LICA  . HTN (hypertension)   . HLD (hyperlipidemia)   . DM2 (diabetes mellitus, type 2)   . DJD (degenerative joint disease)   . Asthmatic bronchitis   . BPH (benign prostatic hypertrophy)   . Herpes zoster   . Anemia   . Thrombocytopenia     a. secondary to splenomegaly related to lymphoma with suspicion of bone marrow involvement. (Plavix had to be stopped due to this)  . Aortic stenosis, moderate     a. Echo 02/14/12 EF 55%, Gr 2 DD, Mod AS (mean grad: 29, peak grad 52)  . Splenomegaly 04/13/2012  . History of blood transfusion 04/13/2012  . 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 Dermatitis 06/07/2012    acnieform rash on back 06/06/12  . COPD (chronic obstructive pulmonary disease)    . Peripheral vascular disease     a. s/p L ray amp 10/13;  b. s/p L 2nd toe amp 12/13  . Pulmonary nodule     8mm RUL calcified pulm nodule noted on CT staging for lymphoma - stable 10/2012.  . Leg DVT (deep venous thromboembolism), chronic     LE dopplers 5/13, 10/13 and 1/14: chronic DVT involving right mid femoral vein, left mid femoral vein, and left popliteal vein.  . Osteomyelitis     L first foot ray amputation 09/2012  . Gangrene     L 2nd toe amputation 11/2012    Past Surgical History  Procedure Laterality Date  . Coronary artery bypass graft  1995  . Rotator cuff repair    . Carpal tunnel release    . Incisional hernia repair    . Kidney surgery    . Back surgery    . I&d extremity  09/16/2012    Procedure: IRRIGATION AND DEBRIDEMENT EXTREMITY;  Surgeon: [name], MD;  Location: [facility];  Service: Orthopedics;  Laterality: Left;  irrigation and debridement and first Ray amputation of left foot  . Amputation  09/16/2012    Procedure: AMPUTATION RAY;  Surgeon: [name], MD;  Location: [facility];  Service: Orthopedics;  Laterality: Left;  first Ray amputation left foot  .  Amputation  11/21/2012    Procedure: AMPUTATION RAY;  Surgeon: [name], MD;  Location: [facility] OR;  Service: Orthopedics;  Laterality: Left;  LEFT SECOND TOE AMPUTATION  . I&d extremity Left 03/01/2013    Procedure: IRRIGATION AND DEBRIDEMENT EXTREMITY;  Surgeon: [name], MD;  Location: [facility];  Service: Orthopedics;  Laterality: Left;  IRRIGATION  AND  DEBRIDEMENT  LEFT  FOOT    Family History  Problem Relation Age of Onset  . Coronary artery disease    . Alcohol abuse      Social History:  reports that he quit smoking about 28 years ago. His smoking use included Cigarettes and Pipe. He has a 30 pack-year smoking history. He has never used smokeless tobacco. He reports that he does not drink alcohol or use illicit drugs.  Allergies:  Allergies  Allergen Reactions  . Azithromycin Itching  .  Doxycycline   . Macrodantin Itching  . Minocycline Hcl Itching  . Nitrofurantoin Itching  . Zithromax [Azithromycin Dihydrate] Itching  . Contrast Media [Iodinated Diagnostic Agents] Rash    Medications: I have reviewed the patient's current medications.  Results for orders placed during the hospital encounter of 09/12/13 (from the past 48 hour(s))  CBC WITH DIFFERENTIAL     Status: Abnormal   Collection Time    09/12/13  9:40 PM      Result Value Range   WBC 7.3  4.0 - 10.5 K/uL   RBC 3.15 (*) 4.22 - 5.81 MIL/uL   Hemoglobin 10.7 (*) 13.0 - 17.0 g/dL   HCT 16.1 (*) 09.6 - 04.5 %   MCV 97.8  78.0 - 100.0 fL   MCH 34.0  26.0 - 34.0 pg   MCHC 34.7  30.0 - 36.0 g/dL   RDW 40.9  81.1 - 91.4 %   Platelets 106 (*) 150 - 400 K/uL   Comment: SPECIMEN CHECKED FOR CLOTS     PLATELET COUNT CONFIRMED BY SMEAR   Neutrophils Relative % 77  43 - 77 %   Lymphocytes Relative 9 (*) 12 - 46 %   Monocytes Relative 14 (*) 3 - 12 %   Eosinophils Relative 0  0 - 5 %   Basophils Relative 0  0 - 1 %   Neutro Abs 5.6  1.7 - 7.7 K/uL   Lymphs Abs 0.7  0.7 - 4.0 K/uL   Monocytes Absolute 1.0  0.1 - 1.0 K/uL   Eosinophils Absolute 0.0  0.0 - 0.7 K/uL   Basophils Absolute 0.0  0.0 - 0.1 K/uL   RBC Morphology ELLIPTOCYTES     Comment: TEARDROP CELLS   WBC Morphology MILD LEFT SHIFT (1-5% METAS, OCC MYELO, OCC BANDS)     Comment: DOHLE BODIES     TOXIC GRANULATION  COMPREHENSIVE METABOLIC PANEL     Status: Abnormal   Collection Time    09/12/13  9:40 PM      Result Value Range   Sodium 124 (*) 135 - 145 mEq/L   Potassium 4.3  3.5 - 5.1 mEq/L   Chloride 91 (*) 96 - 112 mEq/L   CO2 22  19 - 32 mEq/L   Glucose, Bld 214 (*) 70 - 99 mg/dL   BUN 26 (*) 6 - 23 mg/dL   Creatinine, Ser 7.82 (*) 0.50 - 1.35 mg/dL   Calcium 8.9  8.4 - 95.6 mg/dL   Total Protein 6.0  6.0 - 8.3 g/dL   Albumin 3.4 (*) 3.5 - 5.2 g/dL   AST 14  0 - 37 U/L   ALT 11  0 - 53 U/L   Alkaline Phosphatase 132 (*) 39 - 117 U/L    Total Bilirubin 0.6  0.3 - 1.2 mg/dL   GFR calc non Af Amer 45 (*) >90 mL/min   GFR calc Af Amer 52 (*) >90 mL/min   Comment: (NOTE)     The eGFR has been calculated using the CKD EPI equation.     This calculation has not been validated in all clinical situations.     eGFR's persistently <90 mL/min signify possible Chronic Kidney     Disease.  POCT I-STAT, CHEM 8     Status: Abnormal   Collection Time    09/12/13  9:58 PM      Result Value Range   Sodium 128 (*) 135 - 145 mEq/L   Potassium 4.0  3.5 - 5.1 mEq/L   Chloride 96  96 - 112 mEq/L   BUN 26 (*) 6 - 23 mg/dL   Creatinine, Ser 1.61 (*) 0.50 - 1.35 mg/dL   Glucose, Bld 096 (*) 70 - 99 mg/dL   Calcium, Ion 0.45  4.09 - 1.30 mmol/L   TCO2 21  0 - 100 mmol/L   Hemoglobin 10.9 (*) 13.0 - 17.0 g/dL   HCT 81.1 (*) 91.4 - 78.2 %  GLUCOSE, CAPILLARY     Status: Abnormal   Collection Time    09/12/13 10:01 PM      Result Value Range   Glucose-Capillary 191 (*) 70 - 99 mg/dL   Comment 1 Notify RN    URINALYSIS, ROUTINE W REFLEX MICROSCOPIC     Status: Abnormal   Collection Time    09/13/13 12:09 AM      Result Value Range   Color, Urine YELLOW  YELLOW   APPearance CLEAR  CLEAR   Specific Gravity, Urine 1.020  1.005 - 1.030   pH 5.5  5.0 - 8.0   Glucose, UA NEGATIVE  NEGATIVE mg/dL   Hgb urine dipstick TRACE (*) NEGATIVE   Bilirubin Urine NEGATIVE  NEGATIVE   Ketones, ur NEGATIVE  NEGATIVE mg/dL   Protein, ur 30 (*) NEGATIVE mg/dL   Urobilinogen, UA 0.2  0.0 - 1.0 mg/dL   Nitrite NEGATIVE  NEGATIVE   Leukocytes, UA NEGATIVE  NEGATIVE  URINE MICROSCOPIC-ADD ON     Status: None   Collection Time    09/13/13 12:09 AM      Result Value Range   RBC / HPF 3-6  <3 RBC/hpf  COMPREHENSIVE METABOLIC PANEL     Status: Abnormal   Collection Time    09/13/13  4:15 AM      Result Value Range   Sodium 124 (*) 135 - 145 mEq/L   Potassium 4.2  3.5 - 5.1 mEq/L   Chloride 93 (*) 96 - 112 mEq/L   CO2 20  19 - 32 mEq/L   Glucose, Bld  141 (*) 70 - 99 mg/dL   BUN 28 (*) 6 - 23 mg/dL   Creatinine, Ser 9.56 (*) 0.50 - 1.35 mg/dL   Calcium 8.4  8.4 - 21.3 mg/dL   Total Protein 5.3 (*) 6.0 - 8.3 g/dL   Albumin 2.9 (*) 3.5 - 5.2 g/dL   AST 15  0 - 37 U/L   ALT 9  0 - 53 U/L   Alkaline Phosphatase 112  39 - 117 U/L   Total Bilirubin 0.4  0.3 - 1.2 mg/dL   GFR calc non Af Amer 40 (*) >  90 mL/min   GFR calc Af Amer 46 (*) >90 mL/min   Comment: (NOTE)     The eGFR has been calculated using the CKD EPI equation.     This calculation has not been validated in all clinical situations.     eGFR's persistently <90 mL/min signify possible Chronic Kidney     Disease.  CBC     Status: Abnormal   Collection Time    09/13/13  4:15 AM      Result Value Range   WBC 5.5  4.0 - 10.5 K/uL   RBC 2.90 (*) 4.22 - 5.81 MIL/uL   Hemoglobin 9.8 (*) 13.0 - 17.0 g/dL   HCT 16.1 (*) 09.6 - 04.5 %   MCV 99.0  78.0 - 100.0 fL   MCH 33.8  26.0 - 34.0 pg   MCHC 34.1  30.0 - 36.0 g/dL   RDW 40.9  81.1 - 91.4 %   Platelets 89 (*) 150 - 400 K/uL   Comment: CONSISTENT WITH PREVIOUS RESULT    Dg Chest 2 View  09/12/2013   CLINICAL DATA:  Short of breath, cough  EXAM: CHEST  2 VIEW  COMPARISON:  Prior chest x-ray 02/28/2013; Prior CT chest 07/02/2013  FINDINGS: Stable cardiomegaly. Status post median sternotomy with evidence of multivessel CABG including renal bypass. Atherosclerotic calcifications in the transverse aorta. Chronic central bronchitic changes and interstitial prominence. No focal airspace consolidation, pleural effusion or pneumothorax. No acute osseous abnormality.  IMPRESSION: Stable chest x-ray. No active cardiopulmonary disease.   Electronically Signed   By: Malachy Moan M.D.   On: 09/12/2013 22:50    ROS Blood pressure 105/64, pulse 58, temperature 97.7 F (36.5 C), temperature source Oral, resp. rate 16, height 6\' 1"  (1.854 m), weight 191 lb (86.637 kg), SpO2 96.00%. Physical Exam Left arm with moderate swelling in forearm.  Has open area from biopsy with slight surrounding redness but no signs of overt infection; no drainage. Slightly swollen olecranon bursa with mild erythema. Has near normal range of motion of elbow without pain. All compartments in forearm and arm are soft.No evidence of streaking erythema. No soft tissue crepitance.  Assessment/Plan: Left arm cellulitis- Appears well controlled with the IV antibiotics. No signs of septic joint. The swelling is mild and should respond well to elevation. I do not anticipate the need for any surgical intervention. We will check him again tomorrow.  Loanne Drilling 09/13/2013, 7:35 AM

## 2013-09-13 NOTE — Progress Notes (Signed)
ANTIBIOTIC CONSULT NOTE - INITIAL  Pharmacy Consult for Vancomcyin Indication: Cellulitis Left forearm/elbow   Allergies  Allergen Reactions  . Azithromycin Itching  . Doxycycline   . Macrodantin Itching  . Minocycline Hcl Itching  . Nitrofurantoin Itching  . Zithromax [Azithromycin Dihydrate] Itching  . Contrast Media [Iodinated Diagnostic Agents] Rash    Patient Measurements: Height: 6\' 1"  (185.4 cm) Weight: 191 lb (86.637 kg) IBW/kg (Calculated) : 79.9   Vital Signs: Temp: 98.5 F (36.9 C) (10/09 0128) Temp src: Oral (10/09 0128) BP: 126/62 mmHg (10/09 0128) Pulse Rate: 75 (10/09 0128) Intake/Output from previous day:   Intake/Output from this shift:    Labs:  Recent Labs  09/12/13 2140 09/12/13 2158  WBC 7.3  --   HGB 10.7* 10.9*  PLT 106*  --   CREATININE 1.39* 1.70*   Estimated Creatinine Clearance: 36.6 ml/min (by C-G formula based on Cr of 1.7). No results found for this basename: VANCOTROUGH, VANCOPEAK, VANCORANDOM, GENTTROUGH, GENTPEAK, GENTRANDOM, TOBRATROUGH, TOBRAPEAK, TOBRARND, AMIKACINPEAK, AMIKACINTROU, AMIKACIN,  in the last 72 hours   Microbiology: No results found for this or any previous visit (from the past 720 hour(s)).  Medical History: Past Medical History  Diagnosis Date  . CAD (coronary artery disease)     a. s/p CABG 1995;  b. NSTEMI & subsequent BMS to SVG->right PDA 02/03/12.;  c. Cath 02/14/2012 3vd with 4/4 patent grafts and patent stent in vg->pda.  . Carotid stenosis     a. Carotid dopplers 2/13: RICA 40-59%, LICA 0-39%;  b. dopplers 3/14:  40-59% RICA, 0-39% LICA  . HTN (hypertension)   . HLD (hyperlipidemia)   . DM2 (diabetes mellitus, type 2)   . DJD (degenerative joint disease)   . Asthmatic bronchitis   . BPH (benign prostatic hypertrophy)   . Herpes zoster   . Anemia   . Thrombocytopenia     a. secondary to splenomegaly related to lymphoma with suspicion of bone marrow involvement. (Plavix had to be stopped due to  this)  . Aortic stenosis, moderate     a. Echo 02/14/12 EF 55%, Gr 2 DD, Mod AS (mean grad: 29, peak grad 52)  . Splenomegaly 04/13/2012  . History of blood transfusion 04/13/2012  . 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 Dermatitis 06/07/2012    acnieform rash on back 06/06/12  . COPD (chronic obstructive pulmonary disease)   . Peripheral vascular disease     a. s/p L ray amp 10/13;  b. s/p L 2nd toe amp 12/13  . Pulmonary nodule     8mm RUL calcified pulm nodule noted on CT staging for lymphoma - stable 10/2012.  . Leg DVT (deep venous thromboembolism), chronic     LE dopplers 5/13, 10/13 and 1/14: chronic DVT involving right mid femoral vein, left mid femoral vein, and left popliteal vein.  . Osteomyelitis     L first foot ray amputation 09/2012  . Gangrene     L 2nd toe amputation 11/2012    Medications:  Scheduled:  . acyclovir  400 mg Oral BID  . aspirin EC  81 mg Oral Daily  . enoxaparin (LOVENOX) injection  40 mg Subcutaneous Q24H  . gabapentin  300 mg Oral QHS  . insulin aspart  0-9 Units Subcutaneous TID WC  . isosorbide mononitrate  30 mg Oral Daily  . losartan  100 mg Oral Daily  . meloxicam  15 mg Oral Daily  .  metoprolol tartrate  12.5 mg Oral BID  . simvastatin  20 mg Oral q1800  . vancomycin  1,000 mg Intravenous Q24H  . vitamin B-12  1,000 mcg Oral Daily   Infusions:  . sodium chloride 75 mL/hr at 09/13/13 0148   Assessment: 79 yo with hx of CAD, vascular disease, DM w/ vascular complications, neuopathic complications and mantle cell lymphoma presents today with worsening left forearm/elbow cellulitis.  Pt on sulfa at home, worsening erythema and fever.  Vancomycin per RX ordered.  Goal of Therapy:  VT= 10-15 mcg/ml  Plan:   Vancomycin 1250mg  IV x1 in ER then 1Gm IV q24h.  CrCl ~ 33 (N)  F/u SCr/levels/cultures as needed.  Susanne Greenhouse R 09/13/2013,3:07 AM

## 2013-09-13 NOTE — Progress Notes (Signed)
Subjective: Slept well no fever this morning, left forearm is not painful.  Objective: Vital signs in last 24 hours: Temp:  [97.7 F (36.5 C)-102.4 F (39.1 C)] 97.7 F (36.5 C) (10/09 0500) Pulse Rate:  [57-84] 58 (10/09 0500) Resp:  [15-19] 16 (10/09 0500) BP: (104-155)/(39-64) 105/64 mmHg (10/09 0500) SpO2:  [91 %-98 %] 96 % (10/09 0500) Weight:  [83.915 kg (185 lb)-86.637 kg (191 lb)] 86.637 kg (191 lb) (10/09 0200) Weight change:    Intake/Output from previous day: 10/08 0701 - 10/09 0700 In: -  Out: 400 [Urine:400]   General appearance: alert, cooperative and with occasional dry cough Resp: clear to auscultation bilaterally Cardio: regular rate and rhythm and with a 2/6 systolic ejection murmur GI: soft, non-tender; bowel sounds normal; no masses,  no organomegaly Extremities: moderate induration of left forearm and enlarged nontender left olecranon bursa  Lab Results:  Recent Labs  09/12/13 2140 09/12/13 2158 09/13/13 0415  WBC 7.3  --  5.5  HGB 10.7* 10.9* 9.8*  HCT 30.8* 32.0* 28.7*  PLT 106*  --  89*   BMET  Recent Labs  09/12/13 2140 09/12/13 2158 09/13/13 0415  NA 124* 128* 124*  K 4.3 4.0 4.2  CL 91* 96 93*  CO2 22  --  20  GLUCOSE 214* 209* 141*  BUN 26* 26* 28*  CREATININE 1.39* 1.70* 1.54*  CALCIUM 8.9  --  8.4   CMET CMP     Component Value Date/Time   NA 124* 09/13/2013 0415   NA 140 07/02/2013 0755   K 4.2 09/13/2013 0415   K 4.8 07/02/2013 0755   CL 93* 09/13/2013 0415   CL 108* 05/30/2013 0935   CO2 20 09/13/2013 0415   CO2 23 07/02/2013 0755   GLUCOSE 141* 09/13/2013 0415   GLUCOSE 153* 07/02/2013 0755   GLUCOSE 109* 05/30/2013 0935   BUN 28* 09/13/2013 0415   BUN 24.1 07/02/2013 0755   CREATININE 1.54* 09/13/2013 0415   CREATININE 1.0 07/02/2013 0755   CALCIUM 8.4 09/13/2013 0415   CALCIUM 10.0 07/02/2013 0755   PROT 5.3* 09/13/2013 0415   PROT 6.2* 07/02/2013 0755   ALBUMIN 2.9* 09/13/2013 0415   ALBUMIN 3.9 07/02/2013 0755   AST 15  09/13/2013 0415   AST 23 07/02/2013 0755   ALT 9 09/13/2013 0415   ALT 23 07/02/2013 0755   ALKPHOS 112 09/13/2013 0415   ALKPHOS 141 07/02/2013 0755   BILITOT 0.4 09/13/2013 0415   BILITOT 0.49 07/02/2013 0755   GFRNONAA 40* 09/13/2013 0415   GFRAA 46* 09/13/2013 0415    CBG (last 3)   Recent Labs  09/12/13 2201  GLUCAP 191*    INR RESULTS:   Lab Results  Component Value Date   INR 1.09 09/08/2012   INR 1.05 03/29/2012   INR 1.13 02/14/2012     Studies/Results: Dg Chest 2 View  09/12/2013   CLINICAL DATA:  Short of breath, cough  EXAM: CHEST  2 VIEW  COMPARISON:  Prior chest x-ray 02/28/2013; Prior CT chest 07/02/2013  FINDINGS: Stable cardiomegaly. Status post median sternotomy with evidence of multivessel CABG including renal bypass. Atherosclerotic calcifications in the transverse aorta. Chronic central bronchitic changes and interstitial prominence. No focal airspace consolidation, pleural effusion or pneumothorax. No acute osseous abnormality.  IMPRESSION: Stable chest x-ray. No active cardiopulmonary disease.   Electronically Signed   By: Malachy Moan M.D.   On: 09/12/2013 22:50    Medications: I have reviewed the patient's current medications.  Assessment/Plan: #1 Cellulitis: stable on vancomycin, will request ortho consult, will also check LUE venous ultrasound. #2 Hyponatremia: moderate and will continue IVF and hold losartan, recheck electrolytes in am   LOS: 1 day   Janya Eveland G 09/13/2013, 7:03 AM

## 2013-09-14 ENCOUNTER — Inpatient Hospital Stay (HOSPITAL_COMMUNITY): Payer: Medicare Other

## 2013-09-14 LAB — BASIC METABOLIC PANEL
BUN: 28 mg/dL — ABNORMAL HIGH (ref 6–23)
CO2: 18 mEq/L — ABNORMAL LOW (ref 19–32)
Chloride: 96 mEq/L (ref 96–112)
Creatinine, Ser: 1.2 mg/dL (ref 0.50–1.35)
Glucose, Bld: 136 mg/dL — ABNORMAL HIGH (ref 70–99)
Potassium: 4.4 mEq/L (ref 3.5–5.1)
Sodium: 127 mEq/L — ABNORMAL LOW (ref 135–145)

## 2013-09-14 LAB — GLUCOSE, CAPILLARY
Glucose-Capillary: 118 mg/dL — ABNORMAL HIGH (ref 70–99)
Glucose-Capillary: 123 mg/dL — ABNORMAL HIGH (ref 70–99)

## 2013-09-14 MED ORDER — TRAZODONE HCL 50 MG PO TABS
50.0000 mg | ORAL_TABLET | Freq: Every day | ORAL | Status: DC
  Administered 2013-09-14 – 2013-09-17 (×4): 50 mg via ORAL
  Filled 2013-09-14 (×5): qty 1

## 2013-09-14 MED ORDER — SODIUM CHLORIDE 0.9 % IV SOLN
1500.0000 mg | INTRAVENOUS | Status: DC
  Administered 2013-09-15 – 2013-09-17 (×3): 1500 mg via INTRAVENOUS
  Filled 2013-09-14 (×5): qty 1500

## 2013-09-14 NOTE — Progress Notes (Signed)
ANTIBIOTIC CONSULT NOTE - FOLLOW UP  Pharmacy Consult for Vancomycin Indication: L Forearm Cellulitis  Allergies  Allergen Reactions  . Azithromycin Itching  . Doxycycline   . Macrodantin Itching  . Minocycline Hcl Itching  . Nitrofurantoin Itching  . Zithromax [Azithromycin Dihydrate] Itching  . Contrast Media [Iodinated Diagnostic Agents] Rash    Patient Measurements: Height: 6\' 1"  (185.4 cm) Weight: 191 lb (86.637 kg) IBW/kg (Calculated) : 79.9  Vital Signs: Temp: 99.2 F (37.3 C) (10/10 0500) Temp src: Oral (10/10 0500) BP: 130/67 mmHg (10/10 0500) Pulse Rate: 65 (10/10 0500) Intake/Output from previous day: 10/09 0701 - 10/10 0700 In: 480 [P.O.:480] Out: 3175 [Urine:3175] Intake/Output from this shift: Total I/O In: -  Out: 200 [Urine:200]  Labs:  Recent Labs  09/12/13 2140 09/12/13 2158 09/13/13 0415 09/14/13 0443  WBC 7.3  --  5.5  --   HGB 10.7* 10.9* 9.8*  --   PLT 106*  --  89*  --   CREATININE 1.39* 1.70* 1.54* 1.20   Estimated Creatinine Clearance: 51.8 ml/min (by C-G formula based on Cr of 1.2). 46 ml/min/1.72m2 (normalized)  Microbiology: Recent Results (from the past 720 hour(s))  CULTURE, BLOOD (ROUTINE X 2)     Status: None   Collection Time    09/12/13 10:10 PM      Result Value Range Status   Specimen Description BLOOD RIGHT HAND   Final   Special Requests BOTTLES DRAWN AEROBIC AND ANAEROBIC    Final   Culture  Setup Time     Final   Value: 09/13/2013 02:21     Performed at [facility]   Culture     Final   Value:        BLOOD CULTURE RECEIVED NO GROWTH TO DATE CULTURE WILL BE HELD FOR 5 DAYS BEFORE ISSUING A FINAL NEGATIVE REPORT     Performed at [facility]   Report Status PENDING   Incomplete  CULTURE, BLOOD (ROUTINE X 2)     Status: None   Collection Time    09/12/13 10:20 PM      Result Value Range Status   Specimen Description BLOOD LEFT HAND   Final   Special Requests BOTTLES DRAWN AEROBIC ONLY     Final   Culture  Setup Time     Final   Value: 09/13/2013 02:20     Performed at [facility]   Culture     Final   Value:        BLOOD CULTURE RECEIVED NO GROWTH TO DATE CULTURE WILL BE HELD FOR 5 DAYS BEFORE ISSUING A FINAL NEGATIVE REPORT     Performed at [facility]   Report Status PENDING   Incomplete    Anti-infectives   Start     Dose/Rate Route Frequency Ordered Stop   09/13/13 2200  vancomycin (VANCOCIN) IVPB 1000 mg/200 mL premix     1,000 mg 200 mL/hr over 60 Minutes Intravenous Every 24 hours 09/13/13 0217     09/13/13 0200  acyclovir (ZOVIRAX) tablet 400 mg     400 mg Oral 2 times daily 09/13/13 0116     09/12/13 2230  vancomycin (VANCOCIN) 1,250 mg in sodium chloride 0.9 % 250 mL IVPB     1,250 mg 166.7 mL/hr over 90 Minutes Intravenous  Once 09/12/13 2216 09/13/13 0037      Assessment: 79 yo male presents with worsening left forearm/elbow cellulitis, worsening despite septra PTA. Per Ortho, no signs  of septic joint, no plans for surgery at this point. MRI of elbow ordered 10/10 to rule out abscess.  Day #3 Vancomycin  SCr continues to improve, CrCl ~45 ml/min  Tmax 99.2, WBC improved to wnl, blood cultures negative to date  Goal of Therapy:  Vancomycin trough level 10-15 mcg/ml  Plan:   Increase vancomycin to 1500mg  IV q24h  Check trough at steady state if continues Follow up renal function & cultures  Loralee Pacas, PharmD, BCPS Pager: [phone] 09/14/2013,8:35 AM

## 2013-09-14 NOTE — Progress Notes (Signed)
Subjective: Hospital day - 2 Patient reports pain as mild.   Patient seen in rounds with Dr. Lequita Halt. Patient is well, and has had no acute complaints or problems He is feeling better.  Objective: Vital signs in last 24 hours: Temp:  [98.3 F (36.8 C)-99.2 F (37.3 C)] 99.2 F (37.3 C) (10/10 0500) Pulse Rate:  [65-70] 65 (10/10 0500) Resp:  [16-22] 18 (10/10 0500) BP: (130-137)/(64-67) 130/67 mmHg (10/10 0500) SpO2:  [97 %-99 %] 99 % (10/10 0500)  Intake/Output from previous day:  Intake/Output Summary (Last 24 hours) at 09/14/13 0902 Last data filed at 09/14/13 0800  Gross per 24 hour  Intake    240 ml  Output   3375 ml  Net  -3135 ml    Intake/Output this shift: Total I/O In: -  Out: 200 [Urine:200]  Labs:  Recent Labs  09/12/13 2140 09/12/13 2158 09/13/13 0415  HGB 10.7* 10.9* 9.8*    Recent Labs  09/12/13 2140 09/12/13 2158 09/13/13 0415  WBC 7.3  --  5.5  RBC 3.15*  --  2.90*  HCT 30.8* 32.0* 28.7*  PLT 106*  --  89*    Recent Labs  09/13/13 0415 09/14/13 0443  NA 124* 127*  K 4.2 4.4  CL 93* 96  CO2 20 18*  BUN 28* 28*  CREATININE 1.54* 1.20  GLUCOSE 141* 136*  CALCIUM 8.4 9.4   No results found for this basename: LABPT, INR,  in the last 72 hours  EXAM General - Patient is Alert and Appropriate Extremity - Neurovascular intact Sensation intact distally Cellulitis present but looks better today. Compartment soft Motor Function - intact, moving hand and fingers well on exam.   Past Medical History  Diagnosis Date  . CAD (coronary artery disease)     a. s/p CABG 1995;  b. NSTEMI & subsequent BMS to SVG->right PDA 02/03/12.;  c. Cath 02/14/2012 3vd with 4/4 patent grafts and patent stent in vg->pda.  . Carotid stenosis     a. Carotid dopplers 2/13: RICA 40-59%, LICA 0-39%;  b. dopplers 3/14:  40-59% RICA, 0-39% LICA  . HTN (hypertension)   . HLD (hyperlipidemia)   . DM2 (diabetes mellitus, type 2)   . DJD (degenerative joint  disease)   . Asthmatic bronchitis   . BPH (benign prostatic hypertrophy)   . Herpes zoster   . Anemia   . Thrombocytopenia     a. secondary to splenomegaly related to lymphoma with suspicion of bone marrow involvement. (Plavix had to be stopped due to this)  . Aortic stenosis, moderate     a. Echo 02/14/12 EF 55%, Gr 2 DD, Mod AS (mean grad: 29, peak grad 52)  . Splenomegaly 04/13/2012  . History of blood transfusion 04/13/2012  . 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 Dermatitis 06/07/2012    acnieform rash on back 06/06/12  . COPD (chronic obstructive pulmonary disease)   . Peripheral vascular disease     a. s/p L ray amp 10/13;  b. s/p L 2nd toe amp 12/13  . Pulmonary nodule     8mm RUL calcified pulm nodule noted on CT staging for lymphoma - stable 10/2012.  . Leg DVT (deep venous thromboembolism), chronic     LE dopplers 5/13, 10/13 and 1/14: chronic DVT involving right mid femoral vein, left mid femoral vein, and left popliteal vein.  . Osteomyelitis     L first foot  ray amputation 09/2012  . Gangrene     L 2nd toe amputation 11/2012    Assessment/Plan: Hospital day - 2 Active Problems:   Olecranon bursitis  Estimated body mass index is 25.2 kg/(m^2) as calculated from the following:   Height as of this encounter: 6\' 1"  (1.854 m).   Weight as of this encounter: 86.637 kg (191 lb).  Seen in rounds by Dr. Lequita Halt.  He does not feel that any surgery is indicated and will not be required for the arm.  Plan home when Medicine feels appropriate. Ortho will sign off at this time. Please call for any further questions or concerns.  PERKINS, ALEXZANDREW 09/14/2013, 9:02 AM

## 2013-09-14 NOTE — Progress Notes (Signed)
Subjective: Left forearm and elbow area are sore today, he continues to have a dry cough without dyspnea, not sleeping well  Objective: Vital signs in last 24 hours: Temp:  [98.3 F (36.8 C)-99.2 F (37.3 C)] 99.2 F (37.3 C) (10/10 0500) Pulse Rate:  [65-70] 65 (10/10 0500) Resp:  [16-22] 18 (10/10 0500) BP: (130-137)/(64-67) 130/67 mmHg (10/10 0500) SpO2:  [97 %-99 %] 99 % (10/10 0500) Weight change:    Intake/Output from previous day: 10/09 0701 - 10/10 0700 In: 480 [P.O.:480] Out: 3175 [Urine:3175]   General appearance: alert, cooperative and no distress Resp: clear to auscultation bilaterally Cardio: regular rate and rhythm and with a 2/6 SEM GI: soft, non-tender; bowel sounds normal; no masses,  no organomegaly Extremities: moderate edema and induration left forearm, mild tenderness to swollen olecranon bursa  Lab Results:  Recent Labs  09/12/13 2140 09/12/13 2158 09/13/13 0415  WBC 7.3  --  5.5  HGB 10.7* 10.9* 9.8*  HCT 30.8* 32.0* 28.7*  PLT 106*  --  89*   BMET  Recent Labs  09/13/13 0415 09/14/13 0443  NA 124* 127*  K 4.2 4.4  CL 93* 96  CO2 20 18*  GLUCOSE 141* 136*  BUN 28* 28*  CREATININE 1.54* 1.20  CALCIUM 8.4 9.4   CMET CMP     Component Value Date/Time   NA 127* 09/14/2013 0443   NA 140 07/02/2013 0755   K 4.4 09/14/2013 0443   K 4.8 07/02/2013 0755   CL 96 09/14/2013 0443   CL 108* 05/30/2013 0935   CO2 18* 09/14/2013 0443   CO2 23 07/02/2013 0755   GLUCOSE 136* 09/14/2013 0443   GLUCOSE 153* 07/02/2013 0755   GLUCOSE 109* 05/30/2013 0935   BUN 28* 09/14/2013 0443   BUN 24.1 07/02/2013 0755   CREATININE 1.20 09/14/2013 0443   CREATININE 1.0 07/02/2013 0755   CALCIUM 9.4 09/14/2013 0443   CALCIUM 10.0 07/02/2013 0755   PROT 5.3* 09/13/2013 0415   PROT 6.2* 07/02/2013 0755   ALBUMIN 2.9* 09/13/2013 0415   ALBUMIN 3.9 07/02/2013 0755   AST 15 09/13/2013 0415   AST 23 07/02/2013 0755   ALT 9 09/13/2013 0415   ALT 23 07/02/2013 0755   ALKPHOS 112 09/13/2013 0415   ALKPHOS 141 07/02/2013 0755   BILITOT 0.4 09/13/2013 0415   BILITOT 0.49 07/02/2013 0755   GFRNONAA 54* 09/14/2013 0443   GFRAA 62* 09/14/2013 0443    CBG (last 3)   Recent Labs  09/13/13 1636 09/13/13 2036 09/14/13 0737  GLUCAP 119* 130* 146*    INR RESULTS:   Lab Results  Component Value Date   INR 1.09 09/08/2012   INR 1.05 03/29/2012   INR 1.13 02/14/2012     Studies/Results: Dg Chest 2 View  09/12/2013   CLINICAL DATA:  Short of breath, cough  EXAM: CHEST  2 VIEW  COMPARISON:  Prior chest x-ray 02/28/2013; Prior CT chest 07/02/2013  FINDINGS: Stable cardiomegaly. Status post median sternotomy with evidence of multivessel CABG including renal bypass. Atherosclerotic calcifications in the transverse aorta. Chronic central bronchitic changes and interstitial prominence. No focal airspace consolidation, pleural effusion or pneumothorax. No acute osseous abnormality.  IMPRESSION: Stable chest x-ray. No active cardiopulmonary disease.   Electronically Signed   By: Malachy Moan M.D.   On: 09/12/2013 22:50    Medications: I have reviewed the patient's current medications.  Assessment/Plan: #1 Left Forearm Cellulitis: stable however still with low grade fever, appears to be a bad case  of cellulitis, doubt abscess. Will check MRI of the forearm to help exclude abscess, continue vancomycin for now. #2 Hyponatremia: improved with IVF and holding ARB #3 CAD: stable on current meds.   LOS: 2 days   [name] G 09/14/2013, 8:07 AM

## 2013-09-14 NOTE — Clinical Documentation Improvement (Signed)
THIS DOCUMENT IS NOT A PERMANENT PART OF THE MEDICAL RECORD  Please update your documentation with the medical record to reflect your response to this query. If you need help knowing how to do this please call [phone].  09/14/13  Dear Dr. Eloise Harman, [facility]  In an effort to better capture your patient's severity of illness, reflect appropriate length of stay and utilization of resources, a review of the patient medical record has revealed the following indicators.    Based on your clinical judgment, please clarify and document in a progress note and/or discharge summary the clinical condition associated with the following supporting information:  In responding to this query please exercise your independent judgment.  The fact that a query is asked, does not imply that any particular answer is desired or expected.  Pt with mild anemia and thrombocytopenia  Clarification Needed   Please clarify if anemia can be further specified as one of the diagnoses listed below and document in pn or d/c summary.   Possible Clinical Conditions?   " Expected Acute Blood Loss Anemia  " Acute Blood Loss Anemia  " Acute on chronic blood loss anemia   " Other Condition________________  " Cannot Clinically Determine  Risk Factors:   Supporting Information:  Signs and Symptoms   Cellulitis  Thrombocytopenia  Hyponatremia  Anemia   Dementia  Olecranon Bursitis  CAD  Diagnostics: Component     Latest Ref Rng 09/13/2013         4:15 AM  Hemoglobin     13.0 - 17.0 g/dL 9.8 (L)  HCT     95.2 - 52.0 % 28.7 (L)   Component      Hemoglobin HCT  Latest Ref Rng      13.0 - 17.0 g/dL 84.1 - 32.4 %  40/12/270     9:40 PM 10.7 (L) 30.8 (L)  09/12/2013     9:58 PM 10.9 (L) 32.0 (L)   Treatments: Monitoring  Reviewed:  no additional documentation provided ljh  Thank You,  Enis Slipper  RN, BSN, MSN/Inf, CCDS Clinical Documentation Specialist [facility]  Dept Pager: [phone] / E-mail: [email or web address]  [phone]

## 2013-09-15 LAB — CBC
HCT: 31.1 % — ABNORMAL LOW (ref 39.0–52.0)
Hemoglobin: 10.6 g/dL — ABNORMAL LOW (ref 13.0–17.0)
MCHC: 34.1 g/dL (ref 30.0–36.0)
Platelets: 89 10*3/uL — ABNORMAL LOW (ref 150–400)
WBC: 4.5 10*3/uL (ref 4.0–10.5)

## 2013-09-15 LAB — BASIC METABOLIC PANEL
Chloride: 98 mEq/L (ref 96–112)
GFR calc Af Amer: 87 mL/min — ABNORMAL LOW (ref >90)
GFR calc non Af Amer: 75 mL/min — ABNORMAL LOW (ref >90)
Potassium: 4.7 mEq/L (ref 3.5–5.1)

## 2013-09-15 LAB — GLUCOSE, CAPILLARY: Glucose-Capillary: 113 mg/dL — ABNORMAL HIGH (ref 70–99)

## 2013-09-15 MED ORDER — SACCHAROMYCES BOULARDII 250 MG PO CAPS
250.0000 mg | ORAL_CAPSULE | Freq: Two times a day (BID) | ORAL | Status: DC
  Administered 2013-09-15 – 2013-09-18 (×7): 250 mg via ORAL
  Filled 2013-09-15 (×8): qty 1

## 2013-09-15 MED ORDER — PIPERACILLIN-TAZOBACTAM 3.375 G IVPB
3.3750 g | Freq: Three times a day (TID) | INTRAVENOUS | Status: DC
  Administered 2013-09-15 – 2013-09-17 (×6): 3.375 g via INTRAVENOUS
  Filled 2013-09-15 (×7): qty 50

## 2013-09-15 NOTE — Progress Notes (Signed)
ANTIBIOTIC CONSULT NOTE - FOLLOW UP  Pharmacy Consult for Vancomycin/zosyn Indication: L Forearm Cellulitis  Allergies  Allergen Reactions  . Azithromycin Itching  . Doxycycline   . Macrodantin Itching  . Minocycline Hcl Itching  . Nitrofurantoin Itching  . Zithromax [Azithromycin Dihydrate] Itching  . Contrast Media [Iodinated Diagnostic Agents] Rash    Patient Measurements: Height: 6\' 1"  (185.4 cm) Weight: 191 lb (86.637 kg) IBW/kg (Calculated) : 79.9  Vital Signs: Temp: 98.4 F (36.9 C) (10/11 0609) Temp src: Oral (10/11 0609) BP: 169/78 mmHg (10/11 0943) Pulse Rate: 74 (10/11 0943) Intake/Output from previous day: 10/10 0701 - 10/11 0700 In: 2720 [P.O.:120; I.V.:2100; IV Piggyback:500] Out: 1825 [Urine:1825] Intake/Output from this shift: Total I/O In: -  Out: 600 [Urine:600]  Labs:  Recent Labs  09/12/13 2140 09/12/13 2158 09/13/13 0415 09/14/13 0443  WBC 7.3  --  5.5  --   HGB 10.7* 10.9* 9.8*  --   PLT 106*  --  89*  --   CREATININE 1.39* 1.70* 1.54* 1.20   Estimated Creatinine Clearance: 51.8 ml/min (by C-G formula based on Cr of 1.2). 46 ml/min/1.18m2 (normalized)  Microbiology: Recent Results (from the past 720 hour(s))  CULTURE, BLOOD (ROUTINE X 2)     Status: None   Collection Time    09/12/13 10:10 PM      Result Value Range Status   Specimen Description BLOOD RIGHT HAND   Final   Special Requests BOTTLES DRAWN AEROBIC AND ANAEROBIC    Final   Culture  Setup Time     Final   Value: 09/13/2013 02:21     Performed at [facility]   Culture     Final   Value:        BLOOD CULTURE RECEIVED NO GROWTH TO DATE CULTURE WILL BE HELD FOR 5 DAYS BEFORE ISSUING A FINAL NEGATIVE REPORT     Performed at [facility]   Report Status PENDING   Incomplete  CULTURE, BLOOD (ROUTINE X 2)     Status: None   Collection Time    09/12/13 10:20 PM      Result Value Range Status   Specimen Description BLOOD LEFT HAND   Final   Special  Requests BOTTLES DRAWN AEROBIC ONLY    Final   Culture  Setup Time     Final   Value: 09/13/2013 02:20     Performed at [facility]   Culture     Final   Value:        BLOOD CULTURE RECEIVED NO GROWTH TO DATE CULTURE WILL BE HELD FOR 5 DAYS BEFORE ISSUING A FINAL NEGATIVE REPORT     Performed at [facility]   Report Status PENDING   Incomplete    Anti-infectives   Start     Dose/Rate Route Frequency Ordered Stop   09/14/13 1800  vancomycin (VANCOCIN) 1,500 mg in sodium chloride 0.9 % 500 mL IVPB     1,500 mg 250 mL/hr over 120 Minutes Intravenous Every 24 hours 09/14/13 0840     09/13/13 2200  vancomycin (VANCOCIN) IVPB 1000 mg/200 mL premix  Status:  Discontinued     1,000 mg 200 mL/hr over 60 Minutes Intravenous Every 24 hours 09/13/13 0217 09/14/13 0840   09/13/13 0200  acyclovir (ZOVIRAX) tablet 400 mg     400 mg Oral 2 times daily 09/13/13 0116     09/12/13 2230  vancomycin (VANCOCIN) 1,250 mg in sodium chloride 0.9 % 250  mL IVPB     1,250 mg 166.7 mL/hr over 90 Minutes Intravenous  Once 09/12/13 2216 09/13/13 0037      Assessment: 79 yo male presents with worsening left forearm/elbow cellulitis, worsening despite septra PTA. Per Ortho, no signs of septic joint, no plans for surgery at this point. MRI of elbow ordered 10/10 to rule out abscess. Zosyn added 10/11 for no improvement in cellulitis.   10/8 >>vancomycin >> 10/11 >> zosyn >>  Tmax: afebrile WBCs: improved to 5.5 10/9 Renal: SCr improving daily, no new labs today (Scr = 1.2 on 10/10  10/8: blood x 2: ngtd  Dose changes/drug level info:  10/10 empiric increase 1500mg  q24h for improved SCr   Goal of Therapy:  Vancomycin trough level 10-15 mcg/ml  Plan:  D1 zosyn, D4 vancomcycin  Start zosyn 3.375gm IV q8h, each dose over 4h  Continue vancomycin to 1500mg  IV q24h  Check trough at steady state if continues Follow up renal function & cultures  Juliette Alcide, PharmD, BCPS.    Pager: [phone] 09/15/2013,10:36 AM

## 2013-09-15 NOTE — Progress Notes (Signed)
Subjective: Worse pain. Feels he is not improving.  Objective: Vital signs in last 24 hours: Temp:  [98.4 F (36.9 C)-99.8 F (37.7 C)] 98.4 F (36.9 C) (10/11 0609) Pulse Rate:  [69-80] 74 (10/11 0943) Resp:  [18-20] 20 (10/11 0609) BP: (116-177)/(55-78) 169/78 mmHg (10/11 0943) SpO2:  [97 %] 97 % (10/11 0609) Weight change:  Last BM Date: 09/15/13  Intake/Output from previous day: 10/10 0701 - 10/11 0700 In: 2720 [P.O.:120; I.V.:2100; IV Piggyback:500] Out: 1825 [Urine:1825] Intake/Output this shift: Total I/O In: -  Out: 600 [Urine:600]  General appearance: alert, cooperative and appears stated age Resp: clear to auscultation bilaterally Cardio: regular rate and rhythm, S1, S2 normal, no murmur, click, rub or gallop GI: soft, non-tender; bowel sounds normal; no masses,  no organomegaly Extremities: left forearm red and swollen and tender. he says no better. no obvious fluid pocket. Neurologic: Grossly normal   Lab Results:  Recent Labs  09/12/13 2140 09/12/13 2158 09/13/13 0415  WBC 7.3  --  5.5  HGB 10.7* 10.9* 9.8*  HCT 30.8* 32.0* 28.7*  PLT 106*  --  89*   BMET  Recent Labs  09/13/13 0415 09/14/13 0443  NA 124* 127*  K 4.2 4.4  CL 93* 96  CO2 20 18*  GLUCOSE 141* 136*  BUN 28* 28*  CREATININE 1.54* 1.20  CALCIUM 8.4 9.4   CMET CMP     Component Value Date/Time   NA 127* 09/14/2013 0443   NA 140 07/02/2013 0755   K 4.4 09/14/2013 0443   K 4.8 07/02/2013 0755   CL 96 09/14/2013 0443   CL 108* 05/30/2013 0935   CO2 18* 09/14/2013 0443   CO2 23 07/02/2013 0755   GLUCOSE 136* 09/14/2013 0443   GLUCOSE 153* 07/02/2013 0755   GLUCOSE 109* 05/30/2013 0935   BUN 28* 09/14/2013 0443   BUN 24.1 07/02/2013 0755   CREATININE 1.20 09/14/2013 0443   CREATININE 1.0 07/02/2013 0755   CALCIUM 9.4 09/14/2013 0443   CALCIUM 10.0 07/02/2013 0755   PROT 5.3* 09/13/2013 0415   PROT 6.2* 07/02/2013 0755   ALBUMIN 2.9* 09/13/2013 0415   ALBUMIN 3.9 07/02/2013 0755    AST 15 09/13/2013 0415   AST 23 07/02/2013 0755   ALT 9 09/13/2013 0415   ALT 23 07/02/2013 0755   ALKPHOS 112 09/13/2013 0415   ALKPHOS 141 07/02/2013 0755   BILITOT 0.4 09/13/2013 0415   BILITOT 0.49 07/02/2013 0755   GFRNONAA 54* 09/14/2013 0443   GFRAA 62* 09/14/2013 0443     Studies/Results: Mr Elbow Left Wo Contrast  09/14/2013   CLINICAL DATA:  Edema and pain in the left forearm. Cellulitis. Recent skin biopsy.  EXAM: MRI OF THE LEFT FOREARM WITHOUT CONTRAST  TECHNIQUE: Multiplanar, multisequence MR imaging of the FOREARM was performed. No intravenous contrast was administered.  COMPARISON:  None.  FINDINGS: There is diffuse subcutaneous edema throughout the forearm and wrist. The radius and ulna appear normal. No appreciable effusions at the wrist joint or elbow joint. The muscles appear normal.  On several images there is a small fluid collection measuring 23 x 15 x 10 mm just posterior to the proximal ulnar shaft and superficial to the muscle which may represent a small focal abscess.  We could not to give the patient contrast because he was unable to tolerate further scanning.  The study is significantly degraded by motion artifact.  IMPRESSION: 1. Diffuse edema in the subcutaneous tissues of the forearm and wrist consistent with cellulitis.  2. Possible small subcutaneous abscess in the proximal forearm superficial to the posterior aspect of the proximal ulnar shaft.   Electronically Signed   By: Geanie Cooley M.D.   On: 09/14/2013 13:28    Medications: I have reviewed the patient's current medications. Marland Kitchen acyclovir  400 mg Oral BID  . aspirin EC  81 mg Oral Daily  . enoxaparin (LOVENOX) injection  40 mg Subcutaneous Q24H  . gabapentin  300 mg Oral QHS  . insulin aspart  0-9 Units Subcutaneous TID WC  . isosorbide mononitrate  30 mg Oral Daily  . metoprolol tartrate  12.5 mg Oral BID  . simvastatin  20 mg Oral q1800  . traZODone  50 mg Oral QHS  . vancomycin  1,500 mg Intravenous  Q24H  . vitamin B-12  1,000 mcg Oral Daily     Assessment/Plan: Cellulitis of left upper extremity. Cont vanco.  Add zosyn.  Ask ortho to look at [facility] to be sure no drainage needed.   Follow sodim level.  Cad-stable.  Active Problems:   Olecranon bursitis   LOS: 3 days   Ezequiel Kayser, MD 09/15/2013, 10:22 AM

## 2013-09-16 LAB — GLUCOSE, CAPILLARY
Glucose-Capillary: 134 mg/dL — ABNORMAL HIGH (ref 70–99)
Glucose-Capillary: 126 mg/dL — ABNORMAL HIGH (ref 70–99)
Glucose-Capillary: 110 mg/dL — ABNORMAL HIGH (ref 70–99)

## 2013-09-16 LAB — BASIC METABOLIC PANEL
Chloride: 100 mEq/L (ref 96–112)
GFR calc Af Amer: 89 mL/min — ABNORMAL LOW (ref >90)
GFR calc non Af Amer: 77 mL/min — ABNORMAL LOW (ref >90)
Glucose, Bld: 134 mg/dL — ABNORMAL HIGH (ref 70–99)
Potassium: 4.1 mEq/L (ref 3.5–5.1)
Sodium: 131 mEq/L — ABNORMAL LOW (ref 135–145)

## 2013-09-16 NOTE — Op Note (Signed)
NAMEAARYAV, HOPFENSPERGER NO.:  [id number]  MEDICAL RECORD NO.:  [id number]  LOCATION:  1614                         FACILITY:  [facility]  PHYSICIAN:  Vania Rea. Katriona Schmierer, M.D.  DATE OF BIRTH:  November 20, 1927  DATE OF PROCEDURE:  09/16/2013 DATE OF DISCHARGE:                              OPERATIVE REPORT   PREOPERATIVE DIAGNOSIS:  Septic left elbow olecranon bursitis.  POSTOPERATIVE DIAGNOSIS:  Septic left elbow olecranon bursitis.  PROCEDURE:  Incision and drainage of septic left elbow olecranon bursitis.  SURGEON:  Vania Rea. Tamecia Mcdougald, M.D.  ASSISTANT:  None.  ANESTHESIA:  Local 1% lidocaine.  BLOOD LOSS:  Approximately 25 mL.  DRAINS:  The wound was packed with gauze.  HISTORY:  Mr. Palma is an 79 year old gentleman admitted to the hospital with progressive increasing left elbow and forearm pain and swelling and a febrile appearance with MRI scan showing fluid collection and edema in the forearm.  His clinical examination shows marked swelling and fluctuance with extreme tenderness over the apex of the elbow with purulent drainage.  Based on ongoing pain, purulent drainage, he is planned for I and D of the left olecranon at the bedside.  I preoperatively counseled Mr. Pickerill on treatment options as well as risks versus benefits thereof.  Possible complications of bleeding, persistent infection, neurovascular injury, and possibly need for additional surgery were reviewed.  He understands and accepts and agrees to plan.  PROCEDURE IN DETAIL:  The left elbow was prepped with Betadine and a field block of 1% lidocaine was established.  Elbow was then prepped again and draped.  I made a longitudinal 4 cm incision over the apex of the olecranon to gain access to the olecranon bursa and a large amount of bloody purulent material was evacuated, I would estimate close to 60 mL.  The abscess cavity was then explored, loculations were divided, and irrigated and then the  wound was packed with a gauze and bulky dry dressing was applied.  At this time, the plan will be for continued dry dressing changes, continued IV antibiotics, wound check tomorrow.     Vania Rea. Nelline Lio, M.D.     KMS/MEDQ  D:  09/16/2013  T:  09/16/2013  Job:  [id number]

## 2013-09-16 NOTE — Progress Notes (Signed)
Subjective: Thankfully Dr.Supple just did an I and D. Large amount of pus relieved from the elbow, bursa, forearm. Patient still in some pain.  Objective: Vital signs in last 24 hours: Temp:  [98.2 F (36.8 C)-98.9 F (37.2 C)] 98.2 F (36.8 C) (10/12 0500) Pulse Rate:  [64-71] 71 (10/12 0500) Resp:  [18-20] 20 (10/12 0500) BP: (157-163)/(65-87) 157/87 mmHg (10/12 0500) SpO2:  [95 %-98 %] 97 % (10/12 0500) Weight change:  Last BM Date: 09/15/13  Intake/Output from previous day: 10/11 0701 - 10/12 0700 In: 1390 [P.O.:840; IV Piggyback:550] Out: 3100 [Urine:3100] Intake/Output this shift:    General appearance: alert, cooperative and appears stated age Resp: clear to auscultation bilaterally Cardio: regular rate and rhythm, S1, S2 normal, no murmur, click, rub or gallop GI: soft, non-tender; bowel sounds normal; no masses,  no organomegaly Extremities: extremities normal, atraumatic, no cyanosis or edema Neurologic: Grossly normal   Lab Results:  Recent Labs  09/15/13 1040  WBC 4.5  HGB 10.6*  HCT 31.1*  PLT 89*   BMET  Recent Labs  09/15/13 1040 09/16/13 0522  NA 128* 131*  K 4.7 4.1  CL 98 100  CO2 19 19  GLUCOSE 145* 134*  BUN 22 17  CREATININE 0.92 0.88  CALCIUM 10.0 9.9   CMET CMP     Component Value Date/Time   NA 131* 09/16/2013 0522   NA 140 07/02/2013 0755   K 4.1 09/16/2013 0522   K 4.8 07/02/2013 0755   CL 100 09/16/2013 0522   CL 108* 05/30/2013 0935   CO2 19 09/16/2013 0522   CO2 23 07/02/2013 0755   GLUCOSE 134* 09/16/2013 0522   GLUCOSE 153* 07/02/2013 0755   GLUCOSE 109* 05/30/2013 0935   BUN 17 09/16/2013 0522   BUN 24.1 07/02/2013 0755   CREATININE 0.88 09/16/2013 0522   CREATININE 1.0 07/02/2013 0755   CALCIUM 9.9 09/16/2013 0522   CALCIUM 10.0 07/02/2013 0755   PROT 5.3* 09/13/2013 0415   PROT 6.2* 07/02/2013 0755   ALBUMIN 2.9* 09/13/2013 0415   ALBUMIN 3.9 07/02/2013 0755   AST 15 09/13/2013 0415   AST 23 07/02/2013 0755   ALT 9  09/13/2013 0415   ALT 23 07/02/2013 0755   ALKPHOS 112 09/13/2013 0415   ALKPHOS 141 07/02/2013 0755   BILITOT 0.4 09/13/2013 0415   BILITOT 0.49 07/02/2013 0755   GFRNONAA 77* 09/16/2013 0522   GFRAA 89* 09/16/2013 0522     Studies/Results: Mr Elbow Left Wo Contrast  09/14/2013   CLINICAL DATA:  Edema and pain in the left forearm. Cellulitis. Recent skin biopsy.  EXAM: MRI OF THE LEFT FOREARM WITHOUT CONTRAST  TECHNIQUE: Multiplanar, multisequence MR imaging of the FOREARM was performed. No intravenous contrast was administered.  COMPARISON:  None.  FINDINGS: There is diffuse subcutaneous edema throughout the forearm and wrist. The radius and ulna appear normal. No appreciable effusions at the wrist joint or elbow joint. The muscles appear normal.  On several images there is a small fluid collection measuring 23 x 15 x 10 mm just posterior to the proximal ulnar shaft and superficial to the muscle which may represent a small focal abscess.  We could not to give the patient contrast because he was unable to tolerate further scanning.  The study is significantly degraded by motion artifact.  IMPRESSION: 1. Diffuse edema in the subcutaneous tissues of the forearm and wrist consistent with cellulitis. 2. Possible small subcutaneous abscess in the proximal forearm superficial to the posterior aspect  of the proximal ulnar shaft.   Electronically Signed   By: Geanie Cooley M.D.   On: 09/14/2013 13:28    Medications: I have reviewed the patient's current medications. Marland Kitchen acyclovir  400 mg Oral BID  . aspirin EC  81 mg Oral Daily  . enoxaparin (LOVENOX) injection  40 mg Subcutaneous Q24H  . gabapentin  300 mg Oral QHS  . insulin aspart  0-9 Units Subcutaneous TID WC  . isosorbide mononitrate  30 mg Oral Daily  . metoprolol tartrate  12.5 mg Oral BID  . piperacillin-tazobactam (ZOSYN)  IV  3.375 g Intravenous Q8H  . saccharomyces boulardii  250 mg Oral BID  . simvastatin  20 mg Oral q1800  . traZODone  50  mg Oral QHS  . vancomycin  1,500 mg Intravenous Q24H  . vitamin B-12  1,000 mcg Oral Daily      Assessment/Plan: Septic bursitis and abscess of forearm:  S/p i and d today.  Cont. vanco and zosyn for now.  May be able to narrow back to vanco monotherapy tomorrow .  Continue other meds. Resume diet. Greatly appreciate ortho help in this case. Active Problems: Hyponatremia:  Follow. Anemia: follow.   LOS: 4 days   Ezequiel Kayser, MD 09/16/2013, 9:46 AM

## 2013-09-16 NOTE — Progress Notes (Signed)
[name]  MRN: [id number] DOB/Age: 06/04/1927 79 y.o. Physician: Lynnea Maizes, M.D.      Subjective: Reports increasing left elbow pain Vital Signs Temp:  [98.2 F (36.8 C)-98.9 F (37.2 C)] 98.2 F (36.8 C) (10/12 0500) Pulse Rate:  [64-74] 71 (10/12 0500) Resp:  [18-20] 20 (10/12 0500) BP: (116-169)/(65-87) 157/87 mmHg (10/12 0500) SpO2:  [95 %-98 %] 97 % (10/12 0500)  Lab Results  Recent Labs  09/15/13 1040  WBC 4.5  HGB 10.6*  HCT 31.1*  PLT 89*   BMET  Recent Labs  09/15/13 1040 09/16/13 0522  NA 128* 131*  K 4.7 4.1  CL 98 100  CO2 19 19  GLUCOSE 145* 134*  BUN 22 17  CREATININE 0.92 0.88  CALCIUM 10.0 9.9   INR  Date Value Range Status  09/08/2012 1.09  0.00 - 1.49 Final     Exam  Left elbow with marked swelling about olecranon with fluctuance, forearm diffusely swollen and indurated.  Impression Left elbow septic olecranon bursitis  Plan I and D  Cadie Sorci M 09/16/2013, 9:40 AM

## 2013-09-16 NOTE — Progress Notes (Signed)
Report received from G. Crutchfield, RN. No change from initial pm assessment. Will continue to follow the plan of care. 

## 2013-09-16 NOTE — Op Note (Signed)
*   No surgery found *  9:43 AM  PATIENT:   [name]  79 y.o. male  PRE-OPERATIVE DIAGNOSIS:  Left elbow septic olecranon bursitis  POST-OPERATIVE DIAGNOSIS:  same  PROCEDURE:  I and D  SURGEON:  Kelsy Polack, Vania Rea. M.D.  ASSISTANTS: none   ANESTHESIA:   local  EBL: 25cc  SPECIMEN:  Culture of fluid from bursae   Drains: wound packed with gauze   PATIENT DISPOSITION:  Remains on floor    PLAN OF CARE: continue IV abx  Dictation# [id number]

## 2013-09-17 ENCOUNTER — Telehealth: Payer: Self-pay | Admitting: *Deleted

## 2013-09-17 ENCOUNTER — Ambulatory Visit: Payer: Medicare Other

## 2013-09-17 LAB — BASIC METABOLIC PANEL
Calcium: 9.2 mg/dL (ref 8.4–10.5)
Chloride: 103 mEq/L (ref 96–112)
Creatinine, Ser: 0.87 mg/dL (ref 0.50–1.35)
GFR calc Af Amer: 89 mL/min — ABNORMAL LOW (ref >90)
Potassium: 4.4 mEq/L (ref 3.5–5.1)
Sodium: 131 mEq/L — ABNORMAL LOW (ref 135–145)

## 2013-09-17 LAB — GLUCOSE, CAPILLARY
Glucose-Capillary: 129 mg/dL — ABNORMAL HIGH (ref 70–99)
Glucose-Capillary: 139 mg/dL — ABNORMAL HIGH (ref 70–99)

## 2013-09-17 MED ORDER — HYDROXYZINE HCL 25 MG PO TABS
25.0000 mg | ORAL_TABLET | Freq: Three times a day (TID) | ORAL | Status: DC | PRN
  Administered 2013-09-17: 25 mg via ORAL
  Filled 2013-09-17: qty 1

## 2013-09-17 NOTE — Telephone Encounter (Signed)
Pt's wife called reporting "my husband has been in [facility] hospital since last Wednesday because of a bad infection in his elbow where he fell; should he still have chemo 10/20?"  States he maybe discharged tomorrow 10/11.  Note to MD.

## 2013-09-17 NOTE — Care Management Note (Addendum)
    Page 1 of 1   09/19/2013     4:38:28 PM   CARE MANAGEMENT NOTE 09/19/2013  Patient:  [name], [name]   Account Number:  [id number]  Date Initiated:  09/17/2013  Documentation initiated by:  Colleen Can  Subjective/Objective Assessment:   dx septic left elbow-olecaranon bursitis; incision & draninage  09/16/2013     Action/Plan:   CM spoke with patient & spouse. Plans are for him to return to his home in [place] where spouse will be caregiver. They request to use [facility] if services are needed.   Anticipated DC Date:  09/18/2013   Anticipated DC Plan:  HOME W HOME HEALTH SERVICES      DC Planning Services  CM consult      [facility] Choice  HOME HEALTH   Choice offered to / List presented to:  C-1 Patient        HH arranged  HH-1 RN  HH-3 OT      [name] agency  [facility]   Status of service:  Completed, signed off Medicare Important Message given?   (If response is "NO", the following Medicare IM given date fields will be blank) Date Medicare IM given:   Date Additional Medicare IM given:    Discharge Disposition:  HOME W HOME HEALTH SERVICES  Per UR Regulation:  Reviewed for med. necessity/level of care/duration of stay  If discussed at Long Length of Stay Meetings, dates discussed:    Comments:

## 2013-09-17 NOTE — Progress Notes (Signed)
[name]  MRN: [id number] DOB/Age: 1927/04/17 79 y.o. Physician: Jacquelyne Balint Procedure:       Subjective: Up in bathroom, elbow sore but feels some better  Vital Signs Temp:  [98.1 F (36.7 C)-98.7 F (37.1 C)] 98.6 F (37 C) (10/13 0515) Pulse Rate:  [53-65] 53 (10/13 0515) Resp:  [18] 18 (10/13 0515) BP: (126-180)/(53-69) 141/62 mmHg (10/13 0515) SpO2:  [93 %-97 %] 97 % (10/13 0515)  Lab Results  Recent Labs  09/15/13 1040  WBC 4.5  HGB 10.6*  HCT 31.1*  PLT 89*   BMET  Recent Labs  09/16/13 0522 09/17/13 0505  NA 131* 131*  K 4.1 4.4  CL 100 103  CO2 19 20  GLUCOSE 134* 136*  BUN 17 14  CREATININE 0.88 0.87  CALCIUM 9.9 9.2   INR  Date Value Range Status  09/08/2012 1.09  0.00 - 1.49 Final     Exam Left elbow with some scant bloody drainage as expected, redressed  gran stain with : RARE WBC PRESENT, PREDOMINANTLY PMN RARE SQUAMOUS EPITHELIAL CELLS PRESENT FEW GRAM POSITIVE COCCI IN PAIRS IN CLUSTERS            Plan Antibiotics adjusted Will redress again tomorrow and advance/remove packing if appropriate Anticipate DC home tomorrow   [facility] 2 for Dr.Kevin Supple 09/17/2013, 8:22 AM

## 2013-09-17 NOTE — Progress Notes (Signed)
Subjective: Left elbow pain is much improved after incision and drainage yesterday, appetite remains poor, no dyspnea or diarrhea  Objective: Vital signs in last 24 hours: Temp:  [98.1 F (36.7 C)-98.7 F (37.1 C)] 98.6 F (37 C) (10/13 0515) Pulse Rate:  [53-65] 53 (10/13 0515) Resp:  [18] 18 (10/13 0515) BP: (126-180)/(53-69) 141/62 mmHg (10/13 0515) SpO2:  [93 %-97 %] 97 % (10/13 0515) Weight change:    Intake/Output from previous day: 10/12 0701 - 10/13 0700 In: 4038.7 [P.O.:360; I.V.:2978.7; IV Piggyback:700] Out: 1600 [Urine:1600]   General appearance: alert, cooperative and no distress Resp: clear to auscultation bilaterally Cardio: regular rate and rhythm Extremities: left forearm with only mild edema, olecranon bursa draining serosanguenous fluid  Lab Results:  Recent Labs  09/15/13 1040  WBC 4.5  HGB 10.6*  HCT 31.1*  PLT 89*   BMET  Recent Labs  09/16/13 0522 09/17/13 0505  NA 131* 131*  K 4.1 4.4  CL 100 103  CO2 19 20  GLUCOSE 134* 136*  BUN 17 14  CREATININE 0.88 0.87  CALCIUM 9.9 9.2   CMET CMP     Component Value Date/Time   NA 131* 09/17/2013 0505   NA 140 07/02/2013 0755   K 4.4 09/17/2013 0505   K 4.8 07/02/2013 0755   CL 103 09/17/2013 0505   CL 108* 05/30/2013 0935   CO2 20 09/17/2013 0505   CO2 23 07/02/2013 0755   GLUCOSE 136* 09/17/2013 0505   GLUCOSE 153* 07/02/2013 0755   GLUCOSE 109* 05/30/2013 0935   BUN 14 09/17/2013 0505   BUN 24.1 07/02/2013 0755   CREATININE 0.87 09/17/2013 0505   CREATININE 1.0 07/02/2013 0755   CALCIUM 9.2 09/17/2013 0505   CALCIUM 10.0 07/02/2013 0755   PROT 5.3* 09/13/2013 0415   PROT 6.2* 07/02/2013 0755   ALBUMIN 2.9* 09/13/2013 0415   ALBUMIN 3.9 07/02/2013 0755   AST 15 09/13/2013 0415   AST 23 07/02/2013 0755   ALT 9 09/13/2013 0415   ALT 23 07/02/2013 0755   ALKPHOS 112 09/13/2013 0415   ALKPHOS 141 07/02/2013 0755   BILITOT 0.4 09/13/2013 0415   BILITOT 0.49 07/02/2013 0755   GFRNONAA 77*  09/17/2013 0505   GFRAA 89* 09/17/2013 0505    CBG (last 3)   Recent Labs  09/16/13 1212 09/16/13 1635 09/17/13 0711  GLUCAP 126* 110* 129*    INR RESULTS:   Lab Results  Component Value Date   INR 1.09 09/08/2012   INR 1.05 03/29/2012   INR 1.13 02/14/2012     Studies/Results: No results found.  Medications: I have reviewed the patient's current medications.  Assessment/Plan: #1 Cellullitis and bursitis: much improved and gram stain indicates gram positive infection, so will stop zosyn, continue vancomycin, likely discharge to home tomorrow with [facility]. #2 CAD: stable  #3 COPD: stable #4 DM2: stable on SSI   LOS: 5 days   Claborn Janusz G 09/17/2013, 7:28 AM

## 2013-09-17 NOTE — Telephone Encounter (Signed)
Called and spoke with pt's wife; per Dr. Cyndie Chime when pt comes in for appt with Lonna Cobb, NP on 10/20; she'll assess at that time and determine about treatment; the rituxan should not cause any problems.  Pt's wife verbalized understanding and stated they would be here Monday.

## 2013-09-18 LAB — BASIC METABOLIC PANEL
BUN: 14 mg/dL (ref 6–23)
Creatinine, Ser: 0.84 mg/dL (ref 0.50–1.35)
GFR calc Af Amer: >90 mL/min (ref >90)
GFR calc non Af Amer: 78 mL/min — ABNORMAL LOW (ref >90)
Potassium: 3.9 mEq/L (ref 3.5–5.1)
Sodium: 135 mEq/L (ref 135–145)

## 2013-09-18 NOTE — Progress Notes (Signed)
[name]  MRN: [id number] DOB/Age: Jul 13, 1927 79 y.o. Physician: Jacquelyne Balint Procedure:       Subjective: Pt states he just doesn't feel well, elbow and forearm sore. Reports he may not have been moving it as much as we had wanted him to  Vital Signs Temp:  [97.7 F (36.5 C)-98.7 F (37.1 C)] 98.7 F (37.1 C) (10/14 0642) Pulse Rate:  [59-94] 74 (10/14 0642) Resp:  [14-16] 14 (10/14 0642) BP: (112-162)/(55-75) 162/75 mmHg (10/14 0642) SpO2:  [94 %-99 %] 99 % (10/14 0642)  Lab Results  Recent Labs  09/15/13 1040  WBC 4.5  HGB 10.6*  HCT 31.1*  PLT 89*   BMET  Recent Labs  09/17/13 0505 09/18/13 0455  NA 131* 135  K 4.4 3.9  CL 103 103  CO2 20 23  GLUCOSE 136* 128*  BUN 14 14  CREATININE 0.87 0.84  CALCIUM 9.2 9.7   INR  Date Value Range Status  09/08/2012 1.09  0.00 - 1.49 Final     Exam Left elbow dressings are all removed. Only serous bloody drainage from incision site. He still is quite indurated into the lateral forearm, but it is not as red. It is still tender but certainly not fluctuant.  No signs of worsening infection        Plan S/P I&D L elbow  I ordered OT to work with him on rom and edema control and have also ordered him a k pad. No sensistivities final on cultures as yet Feel that he needs aggressive rom and edema control which should improve his elbow greatly. If he goes home today we want to see him Monday in our office for wound check  [name] for Dr.Kevin Supple 09/18/2013, 8:04 AM

## 2013-09-18 NOTE — Progress Notes (Signed)
09/18/13 1100  OT Visit Information  History of Present Illness pt was admitted for I & D of olecranon bursitis  Cognition  Behavior During Therapy WFL for tasks assessed/performed  Memory Decreased short-term memory  Other Exercises  Other Exercises worked through HEP, 10x hour elbow ROM, arom followed by aarom for increased range.  Now tolerating 140 degrees.  Also does finger rom hourly.  3x day, general rom:  wrist, supination and shoulder flexion.  Educated and demonstrated positioning, above head when possible.  Pt sleeps in recliner  OT Assessment/Plan  Follow Up Recommendations Home health OT  OT Equipment None recommended by OT  OT Goal Progression  Progress towards OT goals Goals met/education completed, patient discharged from OT  OT General Charges  $OT Visit 1 Procedure  OT Treatments  $Therapeutic Exercise 8-22 mins  Marica Otter, OTR/L [phone] 09/18/2013

## 2013-09-18 NOTE — Discharge Summary (Signed)
Physician Discharge Summary  Patient ID: [name] MRN: [id number] DOB/AGE: 1927-05-01 79 y.o.  Admit date: 09/12/2013 Discharge date: 09/18/2013   Discharge Diagnoses:  Principal Problem:   Olecranon bursitis Active Problems:   Anemia   Cellulitis   DIABETES MELLITUS, TYPE II   Mantle cell lymphoma   CAD (coronary artery disease)   Thrombocytopenia   Discharged Condition: good  Hospital Course:   This is a pleasant elderly Caucasian male who has an extensive past medical history for coronary artery disease, vascular disease, diabetes with vascular complications, neuropathic complications, and Mantle cell lymphoma in partial remission being treated by oncology and continues to get chemotherapy, last dose in August with next dose scheduled for next month. Notes from Dr. Excell Seltzer and Dr. Cyndie Chime from the month of August and September have been reviewed. This is actually a fairly well compensated individual, indeed he has been mowing his yard with both a riding lawnmower as well as a push mower approximately one week ago.  The patient does have some short-term memory deficits, does need some supervision per the wife and daughter who accompanied the patient today in the emergency room. He had a fall early on Saturday morning while in the street, striking his left forearm. He was asymptomatic after the fall for one-2 days. On Monday he did have 3 lesions removed by dermatology and including one from the left forearm and 2 from the face. The area on the forearm did look somewhat infected, and was observed by dermatology to per family report recommended topical antibiotic treatment and cleansing. However on Monday the patient developed progressive pain in that forearm, and was seen by orthopedics yesterday, thought to have a mild cellulitis as well as an olecranon bursitis, initially given doxycycline, but did not tolerate this secondary to history and was transferred over to sulfa  antibiotics, which were started last night however this was in conjunction with worsening fevers over 101-102F. Patient resisted further evaluation however was convinced to come to the emergency room this evening by wife and daughter, and noted to have worsening erythema consistent with a cellulitis extending from the mid forearm all way into the elbow with some painful movement on range of motion. Orthopedics was called however they deferred aggressive intervention with incision and drainage at this time by telephone with the emergency room physician, and we were called to admit the patient for presumed cellulitis that is possibly refractory to oral antibiotics in conjunction with high fever and the need for IV antibiotics given multiple comorbidities.  He was admitted to a medical bed with telemetry and started on IV vancomycin. He was also seen by orthopedics consultant because his forearm had significant edema. The initial evaluation he had a normal range of motion of the left upper extremity and no pain in the elbow region. However the edema in the forearm worsened and he developed pain in the left elbow despite the antibiotics. He also continued to have low-grade fevers, so a MRI exam of the left forearm was done which showed a posterior proximal left forearm fluid collection and edema of the forearm. An orthopedics consultant reevaluated the patient and felt that he had developed olecranon bursitis, so he did a bedside incision and drainage procedure which evacuated a moderate amount of somewhat purulent material. Cultures from the material are growing Staphylococcus aureus with sensitivities pending. After the incision and drainage procedure the patient's condition gradually improved and he is having less pain and erythema of the left forearm. The plan  is for him to be discharged to home today with wound care by a home health nurse, continue the Bactrim that was previously prescribed, and have a followup  orthopedics evaluation in 6 days for wound check. On the day of discharge he had mild left forearm discomfort, but was able to do some flexion at the elbow. His appetite is reduced and he is not having nausea, vomiting, or diarrhea. Procedures during his hospitalization included a left upper extremity venous ultrasound exam that did not show thrombosis and a left forearm MRI exam, as well as incision and drainage of a left olecranon bursitis. There were no complications related to this hospitalization.  Consults: orthopedic surgery  Significant Diagnostic Studies:  No results found. left upper extremity venous Doppler ultrasound exam, left forearm MRI exam  Labs: Lab Results  Component Value Date   WBC 4.5 09/15/2013   HGB 10.6* 09/15/2013   HCT 31.1* 09/15/2013   MCV 100.0 09/15/2013   PLT 89* 09/15/2013     Recent Labs Lab 09/13/13 0415  09/18/13 0455  NA 124*  < > 135  K 4.2  < > 3.9  CL 93*  < > 103  CO2 20  < > 23  BUN 28*  < > 14  CREATININE 1.54*  < > 0.84  CALCIUM 8.4  < > 9.7  PROT 5.3*  --   --   BILITOT 0.4  --   --   ALKPHOS 112  --   --   ALT 9  --   --   AST 15  --   --   GLUCOSE 141*  < > 128*  < > = values in this interval not displayed.     Lab Results  Component Value Date   INR 1.09 09/08/2012   INR 1.05 03/29/2012   INR 1.13 02/14/2012     Recent Results (from the past 240 hour(s))  CULTURE, BLOOD (ROUTINE X 2)     Status: None   Collection Time    09/12/13 10:10 PM      Result Value Range Status   Specimen Description BLOOD RIGHT HAND   Final   Special Requests BOTTLES DRAWN AEROBIC AND ANAEROBIC    Final   Culture  Setup Time     Final   Value: 09/13/2013 02:21     Performed at [facility]   Culture     Final   Value:        BLOOD CULTURE RECEIVED NO GROWTH TO DATE CULTURE WILL BE HELD FOR 5 DAYS BEFORE ISSUING A FINAL NEGATIVE REPORT     Performed at [facility]   Report Status PENDING   Incomplete  CULTURE, BLOOD  (ROUTINE X 2)     Status: None   Collection Time    09/12/13 10:20 PM      Result Value Range Status   Specimen Description BLOOD LEFT HAND   Final   Special Requests BOTTLES DRAWN AEROBIC ONLY    Final   Culture  Setup Time     Final   Value: 09/13/2013 02:20     Performed at [facility]   Culture     Final   Value:        BLOOD CULTURE RECEIVED NO GROWTH TO DATE CULTURE WILL BE HELD FOR 5 DAYS BEFORE ISSUING A FINAL NEGATIVE REPORT     Performed at [facility]   Report Status PENDING   Incomplete  WOUND  CULTURE     Status: None   Collection Time    09/16/13  9:55 AM      Result Value Range Status   Specimen Description ARM   Final   Special Requests Normal   Final   Gram Stain     Final   Value: RARE WBC PRESENT, PREDOMINANTLY PMN     RARE SQUAMOUS EPITHELIAL CELLS PRESENT     FEW GRAM POSITIVE COCCI IN PAIRS     IN CLUSTERS     Performed at [facility]   Culture     Final   Value: MODERATE STAPHYLOCOCCUS AUREUS     Note: RIFAMPIN AND GENTAMICIN SHOULD NOT BE USED AS SINGLE DRUGS FOR TREATMENT OF STAPH INFECTIONS.     Performed at [facility]   Report Status PENDING   Incomplete      Discharge Exam: Blood pressure 162/75, pulse 74, temperature 98.7 F (37.1 C), temperature source Oral, resp. rate 14, height 6\' 1"  (1.854 m), weight 86.637 kg (191 lb), SpO2 99.00%.  Physical Exam: In general, the patient is an elderly white man who was in no apparent distress while sitting upright in bed. HEENT exam was within normal limits, neck was supple without jugular venous distention, chest had minimal scattered wheezing and no use accessory muscles of respiration, heart had a regular rate and rhythm with a systolic ejection murmur grade 2/6 at the left sternal border, abdomen had normal bowel sounds and no tenderness, extremities were without dependent edema. There is mild edema of the left forearm without significant tenderness or fluctuance and  there is a minimal amount of serosanguineous drainage from the olecranon bursa surgical site.  Disposition: He will be discharged from the hospital to return home in the company of his wife. We have set up home health nursing to perform wound care and initiate occupational therapy for range of motion exercises.  Discharge Orders   Future Appointments Provider Department Dept Phone   09/24/2013 8:45 AM Dava Najjar [facility] MEDICAL ONCOLOGY [phone]   09/24/2013 9:15 AM Rana Snare, NP [facility] MEDICAL ONCOLOGY [phone]   09/24/2013 10:15 AM [facility] MEDICAL ONCOLOGY [phone]   Future Orders Complete By Expires   Ambulatory referral to Home Health  As directed    Comments:     Please evaluate RAJVEER HANDLER for admission to [facility].  Disciplines requested: Nursing and Occupational Therapy  Services to provide: [facility]  Physician to follow patient's care (the person listed here will be responsible for signing ongoing orders): PCP  Requested Start of Care Date: Tomorrow  Special Instructions:  Will need care of incision and drainage site left olecranon bursa, also needs ROM exercises with OT.   Call MD for:  As directed    Comments:     Call for fever, chills, worsening breathing, chest pain, vomiting, severe diarrhea, or worsening symptoms.   Diet - low sodium heart healthy  As directed    Discharge instructions  As directed    Comments:     You will be discharged to home today. A home health nurse will stop by to change the wound dressings. You should call [facility] to set up a wound check with Dr. Rennis Chris next Monday.   Discharge wound care:  As directed    Comments:     Wound should be washed with soap and water and packed with iodoform gauze then covered  with gauze every other day.   Increase activity slowly  As directed        Medication List         acyclovir 400  MG tablet  Commonly known as:  ZOVIRAX  TAKE ONE TABLET BY MOUTH TWICE DAILY TO  PREVENT  VIRUS  INFECTION     aspirin EC 81 MG tablet  Take 81 mg by mouth every morning.     gabapentin 300 MG capsule  Commonly known as:  NEURONTIN  Take 300 mg by mouth at bedtime.     isosorbide mononitrate 30 MG 24 hr tablet  Commonly known as:  IMDUR  TAKE ONE TABLET BY MOUTH EVERY DAY     losartan 100 MG tablet  Commonly known as:  COZAAR  Take 1 tablet by mouth daily.     meloxicam 15 MG tablet  Commonly known as:  MOBIC  Take 15 mg by mouth daily.     metFORMIN 500 MG tablet  Commonly known as:  GLUCOPHAGE  Take 500 mg by mouth 2 (two) times daily with a meal.     metoprolol tartrate 25 MG tablet  Commonly known as:  LOPRESSOR  TAKE 1/2 TABLET TWICE DAILY     ondansetron 8 MG disintegrating tablet  Commonly known as:  ZOFRAN-ODT  Place 8 mg under the tongue every 8 (eight) hours as needed. For nausea.     pravastatin 40 MG tablet  Commonly known as:  PRAVACHOL  Take 40 mg by mouth daily.     sulfamethoxazole-trimethoprim 800-160 MG per tablet  Commonly known as:  BACTRIM DS  Take 1 tablet by mouth 2 (two) times daily.     traMADol 50 MG tablet  Commonly known as:  ULTRAM  Take 50 mg by mouth every 6 (six) hours as needed for pain.     vitamin B-12 500 MCG tablet  Commonly known as:  CYANOCOBALAMIN  Take 1,000 mcg by mouth daily.           Follow-up Information   Follow up with Garlan Fillers, MD. Schedule an appointment as soon as possible for a visit in 1 week.   Specialty:  Internal Medicine   Contact information:   [address] [facility] [place] [address] [phone]       Follow up with Senaida Lange, MD. Schedule an appointment as soon as possible for a visit in 6 days.   Specialty:  Orthopedic Surgery   Contact information:   [address] [place] [address] [phone]        Signed: Garlan Fillers 09/18/2013, 9:15 AM

## 2013-09-18 NOTE — Evaluation (Signed)
Occupational Therapy Evaluation Patient Details Name: [name] MRN: [id number] DOB: [birth year] Today's Date: 09/18/2013 Time: 4098-1191 OT Time Calculation (min): 10 min  OT Assessment / Plan / Recommendation History of present illness pt was admitted for I & D of olecranon bursitis   Clinical Impression   Pt was admitted for the above.  Will return one more time when family is here for education.      OT Assessment       Follow Up Recommendations   HHOT   Barriers to Discharge      Equipment Recommendations       Recommendations for Other Services    Frequency    2x week   Precautions / Restrictions Restrictions Weight Bearing Restrictions: No   Pertinent Vitals/Pain Pain at end range, L elbow    ADL       OT Diagnosis:   General weakness OT Problem List:  decreased ROM LUE OT Treatment Interventions:   Therapeutic exercise/pt and family education  OT Goals(Current goals can be found in the care plan section) ADL Goals Additional ADL Goal #1: pt/family will be independent with rom exercises to LUE Additional ADL Goal #2: pt/family will be independent with positioning for edema management  Visit Information  Last OT Received On: 09/18/13 History of Present Illness: pt was admitted for I & D of olecranon bursitis       Prior Functioning     Home Living Family/patient expects to be discharged to:: Private residence Living Arrangements: Spouse/significant other Prior Function Level of Independence: Independent Communication Communication: No difficulties         Vision/Perception     Copywriter, advertising Arousal/Alertness: Awake/alert Behavior During Therapy: WFL for tasks assessed/performed Overall Cognitive Status:  (h/o memory problems)    Extremity/Trunk Assessment Upper Extremity Assessment Upper Extremity Assessment: LUE deficits/detail LUE Deficits / Details: able to flex elbow to approximately 100 degrees arom     Mobility        Exercise Other Exercises Other Exercises: educated pt on LUE exercises, fingers to shoulders with emphasis on elbow.  Educated to move elbow at least 10x per hour.  Also educated on positioning arm on pillows, above heart, when able for edema management  Pt usually sleeps in recliner   Balance     End of Session    GO     [name] 09/18/2013, 10:45 AM Marica Otter, OTR/L [phone] 09/18/2013

## 2013-09-19 LAB — WOUND CULTURE: Special Requests: NORMAL

## 2013-09-19 LAB — CULTURE, BLOOD (ROUTINE X 2): Culture: NO GROWTH

## 2013-09-24 ENCOUNTER — Encounter (INDEPENDENT_AMBULATORY_CARE_PROVIDER_SITE_OTHER): Payer: Self-pay

## 2013-09-24 ENCOUNTER — Ambulatory Visit (HOSPITAL_BASED_OUTPATIENT_CLINIC_OR_DEPARTMENT_OTHER): Payer: Medicare Other | Admitting: Nurse Practitioner

## 2013-09-24 ENCOUNTER — Ambulatory Visit: Payer: Medicare Other | Admitting: Lab

## 2013-09-24 ENCOUNTER — Ambulatory Visit (HOSPITAL_BASED_OUTPATIENT_CLINIC_OR_DEPARTMENT_OTHER): Payer: Medicare Other

## 2013-09-24 ENCOUNTER — Telehealth: Payer: Self-pay | Admitting: *Deleted

## 2013-09-24 ENCOUNTER — Other Ambulatory Visit (HOSPITAL_BASED_OUTPATIENT_CLINIC_OR_DEPARTMENT_OTHER): Payer: Medicare Other | Admitting: Lab

## 2013-09-24 ENCOUNTER — Telehealth: Payer: Self-pay | Admitting: Oncology

## 2013-09-24 VITALS — BP 140/51 | HR 47 | Temp 97.5°F | Resp 20 | Ht 73.0 in | Wt 189.5 lb

## 2013-09-24 VITALS — BP 127/65 | HR 49 | Temp 97.0°F | Resp 20

## 2013-09-24 DIAGNOSIS — C8313 Mantle cell lymphoma, intra-abdominal lymph nodes: Secondary | ICD-10-CM

## 2013-09-24 DIAGNOSIS — R161 Splenomegaly, not elsewhere classified: Secondary | ICD-10-CM

## 2013-09-24 DIAGNOSIS — Z5112 Encounter for antineoplastic immunotherapy: Secondary | ICD-10-CM

## 2013-09-24 DIAGNOSIS — C8310 Mantle cell lymphoma, unspecified site: Secondary | ICD-10-CM

## 2013-09-24 LAB — CBC WITH DIFFERENTIAL/PLATELET
BASO%: 0.3 % (ref 0.0–2.0)
EOS%: 1.8 % (ref 0.0–7.0)
LYMPH%: 16.3 % (ref 14.0–49.0)
MCHC: 33 g/dL (ref 32.0–36.0)
MCV: 100.3 fL — ABNORMAL HIGH (ref 79.3–98.0)
MONO#: 0.3 10*3/uL (ref 0.1–0.9)
MONO%: 8.5 % (ref 0.0–14.0)
Platelets: 106 10*3/uL — ABNORMAL LOW (ref 140–400)
RBC: 3.14 10*6/uL — ABNORMAL LOW (ref 4.20–5.82)
WBC: 4 10*3/uL (ref 4.0–10.3)
nRBC: 0 % (ref 0–0)

## 2013-09-24 LAB — COMPREHENSIVE METABOLIC PANEL (CC13)
ALT: 13 U/L (ref 0–55)
AST: 18 U/L (ref 5–34)
Albumin: 3.4 g/dL — ABNORMAL LOW (ref 3.5–5.0)
Alkaline Phosphatase: 130 U/L (ref 40–150)
BUN: 23.3 mg/dL (ref 7.0–26.0)
Chloride: 110 mEq/L — ABNORMAL HIGH (ref 98–109)
Creatinine: 1.2 mg/dL (ref 0.7–1.3)
Sodium: 142 mEq/L (ref 136–145)
Total Bilirubin: 0.31 mg/dL (ref 0.20–1.20)

## 2013-09-24 MED ORDER — SODIUM CHLORIDE 0.9 % IV SOLN
375.0000 mg/m2 | Freq: Once | INTRAVENOUS | Status: AC
  Administered 2013-09-24: 800 mg via INTRAVENOUS
  Filled 2013-09-24: qty 80

## 2013-09-24 MED ORDER — SODIUM CHLORIDE 0.9 % IV SOLN
Freq: Once | INTRAVENOUS | Status: AC
  Administered 2013-09-24: 11:00:00 via INTRAVENOUS

## 2013-09-24 MED ORDER — ACETAMINOPHEN 325 MG PO TABS
ORAL_TABLET | ORAL | Status: AC
  Filled 2013-09-24: qty 2

## 2013-09-24 MED ORDER — ACETAMINOPHEN 325 MG PO TABS
650.0000 mg | ORAL_TABLET | Freq: Once | ORAL | Status: AC
  Administered 2013-09-24: 650 mg via ORAL

## 2013-09-24 MED ORDER — DIPHENHYDRAMINE HCL 25 MG PO CAPS
ORAL_CAPSULE | ORAL | Status: AC
  Filled 2013-09-24: qty 2

## 2013-09-24 MED ORDER — DIPHENHYDRAMINE HCL 25 MG PO CAPS
50.0000 mg | ORAL_CAPSULE | Freq: Once | ORAL | Status: AC
  Administered 2013-09-24: 50 mg via ORAL

## 2013-09-24 NOTE — Patient Instructions (Signed)
[facility] Discharge Instructions for Patients Receiving Chemotherapy  Today you received the following chemotherapy agent: Rituxan   To help prevent nausea and vomiting after your treatment, we encourage you to take your nausea medication as prescribed.    If you develop nausea and vomiting that is not controlled by your nausea medication, call the clinic.   BELOW ARE SYMPTOMS THAT SHOULD BE REPORTED IMMEDIATELY:  *FEVER GREATER THAN 100.5 F  *CHILLS WITH OR WITHOUT FEVER  NAUSEA AND VOMITING THAT IS NOT CONTROLLED WITH YOUR NAUSEA MEDICATION  *UNUSUAL SHORTNESS OF BREATH  *UNUSUAL BRUISING OR BLEEDING  TENDERNESS IN MOUTH AND THROAT WITH OR WITHOUT PRESENCE OF ULCERS  *URINARY PROBLEMS  *BOWEL PROBLEMS  UNUSUAL RASH Items with * indicate a potential emergency and should be followed up as soon as possible.  Feel free to call the clinic you have any questions or concerns. The clinic phone number is [phone].    

## 2013-09-24 NOTE — Telephone Encounter (Signed)
Per staff message and POF I have scheduled appts.  JMW  

## 2013-09-24 NOTE — Progress Notes (Signed)
OFFICE PROGRESS NOTE  Interval history:  [name] is an 79 year old man with mantle cell lymphoma, clinical stage III disease initially presenting with weight loss, splenomegaly and pancytopenia in March 2013. He was treated with single agent bendamustine with a partial response. His performance status improved. He is now completing consolidation Rituxan. He has completed 2 of a planned 4 treatments given on an every 2 month schedule.  He was hospitalized 09/12/2013 through 09/18/2013 with a cellulitis/olecranon bursitis. He underwent an incision and drainage procedure. He was discharged home on antibiotics.  He is seen today prior to proceeding with Rituxan. He has had no fever or chills since discharge from the hospital. His appetite is good. The left elbow continues to be "sore". His wife noted a "new draining lesion" at the left elbow over the weekend. He is currently on Bactrim. He denies nausea/vomiting. No mouth sores. No diarrhea. He denies shortness of breath.   Objective: Blood pressure 140/51, pulse 47, temperature 97.5 F (36.4 C), resp. rate 20, height 6\' 1"  (1.854 m), weight 189 lb 8 oz (85.957 kg).  Oropharynx is without thrush or ulceration. No palpable cervical, supraclavicular or axillary lymph nodes. Lungs are clear. No wheezes or rales. Regular cardiac rhythm. 2-3/6 systolic murmur. Abdomen is soft and nontender. No splenomegaly. No leg edema. Left elbow edematous. Incision and drainage site appears to be healing. Area of serous drainage at the forearm.  Lab Results: Lab Results  Component Value Date   WBC 4.0 09/24/2013   HGB 10.4* 09/24/2013   HCT 31.5* 09/24/2013   MCV 100.3* 09/24/2013   PLT 106* 09/24/2013    Chemistry:    Chemistry      Component Value Date/Time   NA 135 09/18/2013 0455   NA 140 07/02/2013 0755   K 3.9 09/18/2013 0455   K 4.8 07/02/2013 0755   CL 103 09/18/2013 0455   CL 108* 05/30/2013 0935   CO2 23 09/18/2013 0455   CO2 23 07/02/2013  0755   BUN 14 09/18/2013 0455   BUN 24.1 07/02/2013 0755   CREATININE 0.84 09/18/2013 0455   CREATININE 1.0 07/02/2013 0755      Component Value Date/Time   CALCIUM 9.7 09/18/2013 0455   CALCIUM 10.0 07/02/2013 0755   ALKPHOS 112 09/13/2013 0415   ALKPHOS 141 07/02/2013 0755   AST 15 09/13/2013 0415   AST 23 07/02/2013 0755   ALT 9 09/13/2013 0415   ALT 23 07/02/2013 0755   BILITOT 0.4 09/13/2013 0415   BILITOT 0.49 07/02/2013 0755       Studies/Results: Dg Chest 2 View  09/12/2013   CLINICAL DATA:  Short of breath, cough  EXAM: CHEST  2 VIEW  COMPARISON:  Prior chest x-ray 02/28/2013; Prior CT chest 07/02/2013  FINDINGS: Stable cardiomegaly. Status post median sternotomy with evidence of multivessel CABG including renal bypass. Atherosclerotic calcifications in the transverse aorta. Chronic central bronchitic changes and interstitial prominence. No focal airspace consolidation, pleural effusion or pneumothorax. No acute osseous abnormality.  IMPRESSION: Stable chest x-ray. No active cardiopulmonary disease.   Electronically Signed   By: Malachy Moan M.D.   On: 09/12/2013 22:50   Mr Elbow Left Wo Contrast  09/14/2013   CLINICAL DATA:  Edema and pain in the left forearm. Cellulitis. Recent skin biopsy.  EXAM: MRI OF THE LEFT FOREARM WITHOUT CONTRAST  TECHNIQUE: Multiplanar, multisequence MR imaging of the FOREARM was performed. No intravenous contrast was administered.  COMPARISON:  None.  FINDINGS: There is diffuse subcutaneous edema throughout  the forearm and wrist. The radius and ulna appear normal. No appreciable effusions at the wrist joint or elbow joint. The muscles appear normal.  On several images there is a small fluid collection measuring 23 x 15 x 10 mm just posterior to the proximal ulnar shaft and superficial to the muscle which may represent a small focal abscess.  We could not to give the patient contrast because he was unable to tolerate further scanning.  The study is  significantly degraded by motion artifact.  IMPRESSION: 1. Diffuse edema in the subcutaneous tissues of the forearm and wrist consistent with cellulitis. 2. Possible small subcutaneous abscess in the proximal forearm superficial to the posterior aspect of the proximal ulnar shaft.   Electronically Signed   By: Geanie Cooley M.D.   On: 09/14/2013 13:28    Medications: I have reviewed the patient's current medications.  Assessment/Plan:  1. Clinical stage III mantle cell lymphoma with previous treatment as outlined above. Currently completing a course of maintenance Rituxan with 4 treatments planned every 2 months.  2. Advanced coronary artery disease status post bypass surgery, status post stent. 3. Obstructive airway disease. 4. Type 2 diabetes. 5. Peripheral vascular disease. 6. Postherpetic neuralgia on chronic gabapentin and prophylactic Zovirax. 7. Hyperlipidemia. 8. History of atrial flutter. 9. Hospitalization 09/12/2013 through 09/18/2013 with cellulitis/olecranon bursitis status post incision and drainage. 10. 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  Disposition-[name] appears stable. Plan to proceed with Rituxan today as scheduled. He will return in approximately 8 weeks for the final Rituxan infusion.  He and his wife understand to contact orthopedics with further changes at the left elbow.  Plan reviewed with Dr. Cyndie Chime.  Lonna Cobb ANP/GNP-BC

## 2013-09-24 NOTE — Progress Notes (Signed)
Labs drawn from PIV upon insertion clotted, per lab. Lab sent tubes back to infusion room. Unable to obtain labs after multiple sticks. This RN called lab; per lab, send patient and lab tubes back to lab after completed treatment for redraw.

## 2013-09-24 NOTE — Telephone Encounter (Signed)
lvm for pt regarding to Dec appts...mailed pt avs and letter

## 2013-11-09 ENCOUNTER — Encounter (INDEPENDENT_AMBULATORY_CARE_PROVIDER_SITE_OTHER): Payer: Self-pay

## 2013-11-09 ENCOUNTER — Ambulatory Visit (INDEPENDENT_AMBULATORY_CARE_PROVIDER_SITE_OTHER): Payer: Medicare Other | Admitting: General Surgery

## 2013-11-09 ENCOUNTER — Encounter (INDEPENDENT_AMBULATORY_CARE_PROVIDER_SITE_OTHER): Payer: Self-pay | Admitting: General Surgery

## 2013-11-09 VITALS — BP 120/60 | HR 68 | Temp 98.9°F | Resp 14 | Ht 72.0 in | Wt 193.4 lb

## 2013-11-09 DIAGNOSIS — L739 Follicular disorder, unspecified: Secondary | ICD-10-CM

## 2013-11-09 DIAGNOSIS — L738 Other specified follicular disorders: Secondary | ICD-10-CM

## 2013-11-09 NOTE — Progress Notes (Signed)
Patient ID: [name], male   DOB: August 24, 1927, 79 y.o.   MRN: [id number]  Chief Complaint  Patient presents with  . Follow-up    boil on right leg/both axillary    HPI [name] is a 80 y.o. male.  This patient comes in today for evaluation ofmultiple skin abscesses. He says that he has a history of non-Hodgkin's lymphoma and the is undergoing chemotherapy. He said that over the last 2-3 weeks he has noticed several small "boils" mostly on his arms but also has large legs and his armpits. They do cause some discomfort and they haven't really drained. Other than this he has been feeling okay and denies any fevers or chills. He was recently started on Bactrim earlier today. He has not seen a dermatologist for this previously although he is followed by a dermatologist retaining 4 history of skin cancer HPI  Past Medical History  Diagnosis Date  . CAD (coronary artery disease)     a. s/p CABG 1995;  b. NSTEMI & subsequent BMS to SVG->right PDA 02/03/12.;  c. Cath 02/14/2012 3vd with 4/4 patent grafts and patent stent in vg->pda.  . Carotid stenosis     a. Carotid dopplers 2/13: RICA 40-59%, LICA 0-39%;  b. dopplers 3/14:  40-59% RICA, 0-39% LICA  . HTN (hypertension)   . HLD (hyperlipidemia)   . DM2 (diabetes mellitus, type 2)   . DJD (degenerative joint disease)   . Asthmatic bronchitis   . BPH (benign prostatic hypertrophy)   . Herpes zoster   . Anemia   . Thrombocytopenia     a. secondary to splenomegaly related to lymphoma with suspicion of bone marrow involvement. (Plavix had to be stopped due to this)  . Aortic stenosis, moderate     a. Echo 02/14/12 EF 55%, Gr 2 DD, Mod AS (mean grad: 29, peak grad 52)  . Splenomegaly 04/13/2012  . History of blood transfusion 04/13/2012  . 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 Dermatitis 06/07/2012    acnieform rash on back 06/06/12  . COPD (chronic obstructive pulmonary  disease)   . Peripheral vascular disease     a. s/p L ray amp 10/13;  b. s/p L 2nd toe amp 12/13  . Pulmonary nodule     8mm RUL calcified pulm nodule noted on CT staging for lymphoma - stable 10/2012.  . Leg DVT (deep venous thromboembolism), chronic     LE dopplers 5/13, 10/13 and 1/14: chronic DVT involving right mid femoral vein, left mid femoral vein, and left popliteal vein.  . Osteomyelitis     L first foot ray amputation 09/2012  . Gangrene     L 2nd toe amputation 11/2012    Past Surgical History  Procedure Laterality Date  . Coronary artery bypass graft  1995  . Rotator cuff repair    . Carpal tunnel release    . Incisional hernia repair    . Kidney surgery    . Back surgery    . I&d extremity  09/16/2012    Procedure: IRRIGATION AND DEBRIDEMENT EXTREMITY;  Surgeon: [name], MD;  Location: [facility];  Service: Orthopedics;  Laterality: Left;  irrigation and debridement and first Ray amputation of left foot  . Amputation  09/16/2012    Procedure: AMPUTATION RAY;  Surgeon: [name], MD;  Location: [facility];  Service: Orthopedics;  Laterality: Left;  first Ray amputation left foot  .  Amputation  11/21/2012    Procedure: AMPUTATION RAY;  Surgeon: [name], MD;  Location: [facility];  Service: Orthopedics;  Laterality: Left;  LEFT SECOND TOE AMPUTATION  . I&d extremity Left 03/01/2013    Procedure: IRRIGATION AND DEBRIDEMENT EXTREMITY;  Surgeon: [name], MD;  Location: [facility];  Service: Orthopedics;  Laterality: Left;  IRRIGATION  AND  DEBRIDEMENT  LEFT  FOOT    Family History  Problem Relation Age of Onset  . Coronary artery disease    . Alcohol abuse      Social History History  Substance Use Topics  . Smoking status: Former Smoker -- 1.00 packs/day for 30 years    Types: Cigarettes, Pipe    Quit date: 03/29/1985  . Smokeless tobacco: Never Used     Comment: quit in 1986  . Alcohol Use: No    Allergies  Allergen Reactions  . Azithromycin Itching  .  Doxycycline   . Macrodantin Itching  . Minocycline Hcl Itching  . Nitrofurantoin Itching  . Zithromax [Azithromycin Dihydrate] Itching  . Contrast Media [Iodinated Diagnostic Agents] Rash    Current Outpatient Prescriptions  Medication Sig Dispense Refill  . acyclovir (ZOVIRAX) 400 MG tablet TAKE ONE TABLET BY MOUTH TWICE DAILY TO  PREVENT  VIRUS  INFECTION  60 tablet  2  . aspirin EC 81 MG tablet Take 81 mg by mouth every morning.       . gabapentin (NEURONTIN) 300 MG capsule Take 300 mg by mouth at bedtime.       . isosorbide mononitrate (IMDUR) 30 MG 24 hr tablet TAKE ONE TABLET BY MOUTH EVERY DAY  90 tablet  1  . losartan (COZAAR) 100 MG tablet Take 1 tablet by mouth daily.       . meloxicam (MOBIC) 15 MG tablet Take 15 mg by mouth daily.      . metFORMIN (GLUCOPHAGE) 500 MG tablet Take 500 mg by mouth 2 (two) times daily with a meal.       . metoprolol tartrate (LOPRESSOR) 25 MG tablet TAKE 1/2 TABLET TWICE DAILY      . pravastatin (PRAVACHOL) 40 MG tablet Take 40 mg by mouth daily.      . vitamin B-12 (CYANOCOBALAMIN) 500 MCG tablet Take 1,000 mcg by mouth daily.       . ondansetron (ZOFRAN-ODT) 8 MG disintegrating tablet Place 8 mg under the tongue every 8 (eight) hours as needed. For nausea.       No current facility-administered medications for this visit.    Review of Systems Review of Systems All other review of systems negative or noncontributory except as stated in the HPI  Blood pressure 120/60, pulse 68, temperature 98.9 F (37.2 C), temperature source Temporal, resp. rate 14, height 6' (1.829 m), weight 193 lb 6.4 oz (87.726 kg).  Physical Exam Physical Exam  Vitals reviewed. Constitutional: He is oriented to person, place, and time. He appears well-developed and well-nourished. No distress.  HENT:  Head: Normocephalic and atraumatic.  Eyes: Pupils are equal, round, and reactive to light.  Neck: Normal range of motion. Neck supple.  Pulmonary/Chest: Effort  normal.  Abdominal: Soft. He exhibits no distension. There is no tenderness.  Musculoskeletal: Normal range of motion.  Neurological: He is alert and oriented to person, place, and time.  Skin: He is not diaphoretic.  He has multiple small skin abscesses in various stages of healing. He has a 1 cm abscess in his left axilla with a central scab and I  removed this scab and expressed purulence. He has 2 small nodules in his right axilla which are slightly red but these are less than 1 cm in don't think that these are really drainable. He has another small lesion on his left forearm and I removed the scab overlying this and expressed and others small amount of purulence and there is another small skin abscess on his right lateral shin which does not appear fluctuant and I'm not sure it is really a skin abscess. He doesn't have much erythema associated with any of these lesions. He also has other small scars and scabs other places on his arms where he has had other lesions.   Data Reviewed   Assessment    Multiple small skin abscesses/folliculitis I do not think that this is hidradenitis. He has multiple small random skin abscesses in these were able to be drained by removing the overlying scalp and expressing the purulence.  I do not think that any of these will require any surgical drainage at this time. He was started on Bactrim today and I think it is reasonable to continue on this for the next week to 10 days to see if they improve with the antibiotics. I am not sure if the chemotherapy is contributing to to developing these lesions. I have recommended that he consult with his dermatologist to see if there is any topical soaps or antibiotics which might be beneficial to prevent this from occurring because I do not think that these are individual sebaceous cyst that are becoming infected. Again, I don't think he needs any acute surgical drainage and I think that these should improve with topical  treatments and antibiotics. Nevertheless, the I would recommend that he followup with [place] next week for repeat evaluation and wound check to make sure that these are not progressing.     Plan    followup with dermatology Continue antibiotics Followup with [place] in one week for repeat evaluation        Tanyiah Laurich DAVID 11/09/2013, 5:16 PM

## 2013-11-14 ENCOUNTER — Encounter (INDEPENDENT_AMBULATORY_CARE_PROVIDER_SITE_OTHER): Payer: Medicare Other | Admitting: General Surgery

## 2013-11-23 ENCOUNTER — Other Ambulatory Visit: Payer: Self-pay

## 2013-11-23 ENCOUNTER — Ambulatory Visit (HOSPITAL_BASED_OUTPATIENT_CLINIC_OR_DEPARTMENT_OTHER): Payer: Medicare Other

## 2013-11-23 ENCOUNTER — Ambulatory Visit (HOSPITAL_BASED_OUTPATIENT_CLINIC_OR_DEPARTMENT_OTHER): Payer: Medicare Other | Admitting: Oncology

## 2013-11-23 ENCOUNTER — Other Ambulatory Visit: Payer: Self-pay | Admitting: *Deleted

## 2013-11-23 ENCOUNTER — Inpatient Hospital Stay (HOSPITAL_COMMUNITY)
Admission: EM | Admit: 2013-11-23 | Discharge: 2013-11-25 | DRG: 641 | Disposition: A | Discharge status: Home / Self Care | Payer: Medicare Other | Attending: Internal Medicine | Admitting: Internal Medicine

## 2013-11-23 ENCOUNTER — Ambulatory Visit: Payer: Medicare Other

## 2013-11-23 ENCOUNTER — Telehealth: Payer: Self-pay | Admitting: Oncology

## 2013-11-23 ENCOUNTER — Other Ambulatory Visit (HOSPITAL_BASED_OUTPATIENT_CLINIC_OR_DEPARTMENT_OTHER): Payer: Medicare Other

## 2013-11-23 ENCOUNTER — Encounter (HOSPITAL_COMMUNITY): Payer: Self-pay | Admitting: Emergency Medicine

## 2013-11-23 VITALS — BP 136/49 | HR 54 | Temp 97.1°F | Resp 18 | Ht 72.0 in | Wt 191.1 lb

## 2013-11-23 VITALS — BP 132/46 | HR 51 | Temp 97.5°F | Resp 16

## 2013-11-23 DIAGNOSIS — C8310 Mantle cell lymphoma, unspecified site: Secondary | ICD-10-CM

## 2013-11-23 DIAGNOSIS — J449 Chronic obstructive pulmonary disease, unspecified: Secondary | ICD-10-CM | POA: Diagnosis present

## 2013-11-23 DIAGNOSIS — C8313 Mantle cell lymphoma, intra-abdominal lymph nodes: Secondary | ICD-10-CM

## 2013-11-23 DIAGNOSIS — I252 Old myocardial infarction: Secondary | ICD-10-CM

## 2013-11-23 DIAGNOSIS — T3995XA Adverse effect of unspecified nonopioid analgesic, antipyretic and antirheumatic, initial encounter: Secondary | ICD-10-CM | POA: Diagnosis present

## 2013-11-23 DIAGNOSIS — Z951 Presence of aortocoronary bypass graft: Secondary | ICD-10-CM

## 2013-11-23 DIAGNOSIS — E875 Hyperkalemia: Secondary | ICD-10-CM

## 2013-11-23 DIAGNOSIS — Z8249 Family history of ischemic heart disease and other diseases of the circulatory system: Secondary | ICD-10-CM

## 2013-11-23 DIAGNOSIS — D696 Thrombocytopenia, unspecified: Secondary | ICD-10-CM

## 2013-11-23 DIAGNOSIS — Z7982 Long term (current) use of aspirin: Secondary | ICD-10-CM

## 2013-11-23 DIAGNOSIS — C8319 Mantle cell lymphoma, extranodal and solid organ sites: Secondary | ICD-10-CM | POA: Diagnosis present

## 2013-11-23 DIAGNOSIS — J4489 Other specified chronic obstructive pulmonary disease: Secondary | ICD-10-CM | POA: Diagnosis present

## 2013-11-23 DIAGNOSIS — E785 Hyperlipidemia, unspecified: Secondary | ICD-10-CM | POA: Diagnosis present

## 2013-11-23 DIAGNOSIS — R161 Splenomegaly, not elsewhere classified: Secondary | ICD-10-CM

## 2013-11-23 DIAGNOSIS — Z5112 Encounter for antineoplastic immunotherapy: Secondary | ICD-10-CM

## 2013-11-23 DIAGNOSIS — I359 Nonrheumatic aortic valve disorder, unspecified: Secondary | ICD-10-CM | POA: Diagnosis present

## 2013-11-23 DIAGNOSIS — Z87891 Personal history of nicotine dependence: Secondary | ICD-10-CM

## 2013-11-23 DIAGNOSIS — E119 Type 2 diabetes mellitus without complications: Secondary | ICD-10-CM

## 2013-11-23 DIAGNOSIS — I2510 Atherosclerotic heart disease of native coronary artery without angina pectoris: Secondary | ICD-10-CM | POA: Diagnosis present

## 2013-11-23 DIAGNOSIS — N289 Disorder of kidney and ureter, unspecified: Secondary | ICD-10-CM | POA: Diagnosis present

## 2013-11-23 DIAGNOSIS — R7989 Other specified abnormal findings of blood chemistry: Secondary | ICD-10-CM

## 2013-11-23 LAB — COMPREHENSIVE METABOLIC PANEL (CC13)
ALT: 19 U/L (ref 0–55)
AST: 23 U/L (ref 5–34)
Anion Gap: 7 mEq/L (ref 3–11)
CO2: 19 mEq/L — ABNORMAL LOW (ref 22–29)
Calcium: 10.2 mg/dL (ref 8.4–10.4)
Chloride: 111 mEq/L — ABNORMAL HIGH (ref 98–109)
Creatinine: 1.6 mg/dL — ABNORMAL HIGH (ref 0.7–1.3)
Glucose: 95 mg/dl (ref 70–140)
Total Bilirubin: 0.41 mg/dL (ref 0.20–1.20)

## 2013-11-23 LAB — CBC WITH DIFFERENTIAL/PLATELET
BASO%: 0.3 % (ref 0.0–2.0)
Basophils Absolute: 0 10*3/uL (ref 0.0–0.1)
EOS%: 3.1 % (ref 0.0–7.0)
LYMPH%: 26.1 % (ref 14.0–49.0)
MCH: 33.9 pg — ABNORMAL HIGH (ref 27.2–33.4)
MCHC: 33.2 g/dL (ref 32.0–36.0)
MCV: 102 fL — ABNORMAL HIGH (ref 79.3–98.0)
MONO%: 11.6 % (ref 0.0–14.0)
NEUT%: 58.9 % (ref 39.0–75.0)
RBC: 3.54 10*6/uL — ABNORMAL LOW (ref 4.20–5.82)
RDW: 15 % — ABNORMAL HIGH (ref 11.0–14.6)
lymph#: 0.9 10*3/uL (ref 0.9–3.3)
nRBC: 0 % (ref 0–0)

## 2013-11-23 LAB — BASIC METABOLIC PANEL
BUN: 38 mg/dL — ABNORMAL HIGH (ref 6–23)
Chloride: 107 mEq/L (ref 96–112)
Creatinine, Ser: 1.61 mg/dL — ABNORMAL HIGH (ref 0.50–1.35)
Glucose, Bld: 98 mg/dL (ref 70–99)
Potassium: 7 mEq/L (ref 3.5–5.1)

## 2013-11-23 LAB — MRSA PCR SCREENING: MRSA by PCR: NEGATIVE

## 2013-11-23 LAB — GLUCOSE, CAPILLARY: Glucose-Capillary: 85 mg/dL (ref 70–99)

## 2013-11-23 LAB — LACTATE DEHYDROGENASE (CC13): LDH: 281 U/L — ABNORMAL HIGH (ref 125–245)

## 2013-11-23 MED ORDER — ENOXAPARIN SODIUM 40 MG/0.4ML ~~LOC~~ SOLN
40.0000 mg | SUBCUTANEOUS | Status: DC
  Administered 2013-11-23 – 2013-11-24 (×2): 40 mg via SUBCUTANEOUS
  Filled 2013-11-23 (×5): qty 0.4

## 2013-11-23 MED ORDER — SODIUM CHLORIDE 0.9 % IV SOLN
Freq: Once | INTRAVENOUS | Status: AC
  Administered 2013-11-23: 12:00:00 via INTRAVENOUS

## 2013-11-23 MED ORDER — ACETAMINOPHEN 325 MG PO TABS
650.0000 mg | ORAL_TABLET | Freq: Once | ORAL | Status: AC
  Administered 2013-11-23: 650 mg via ORAL

## 2013-11-23 MED ORDER — ASPIRIN EC 81 MG PO TBEC
81.0000 mg | DELAYED_RELEASE_TABLET | Freq: Every day | ORAL | Status: DC
  Administered 2013-11-24 – 2013-11-25 (×2): 81 mg via ORAL
  Filled 2013-11-23 (×2): qty 1

## 2013-11-23 MED ORDER — DIPHENHYDRAMINE HCL 25 MG PO CAPS
ORAL_CAPSULE | ORAL | Status: AC
  Filled 2013-11-23: qty 2

## 2013-11-23 MED ORDER — SODIUM POLYSTYRENE SULFONATE 15 GM/60ML PO SUSP
30.0000 g | Freq: Once | ORAL | Status: AC
Start: 2013-11-23 — End: 2013-11-23
  Administered 2013-11-23: 30 g via ORAL
  Filled 2013-11-23 (×2): qty 120

## 2013-11-23 MED ORDER — SODIUM POLYSTYRENE SULFONATE 15 GM/60ML PO SUSP
30.0000 g | Freq: Once | ORAL | Status: AC
  Administered 2013-11-23: 30 g via ORAL
  Filled 2013-11-23: qty 120

## 2013-11-23 MED ORDER — HYDROXYZINE HCL 25 MG PO TABS
25.0000 mg | ORAL_TABLET | ORAL | Status: DC | PRN
  Filled 2013-11-23: qty 1

## 2013-11-23 MED ORDER — GABAPENTIN 300 MG PO CAPS
300.0000 mg | ORAL_CAPSULE | Freq: Every day | ORAL | Status: DC
  Administered 2013-11-23 – 2013-11-24 (×2): 300 mg via ORAL
  Filled 2013-11-23 (×5): qty 1

## 2013-11-23 MED ORDER — ISOSORBIDE MONONITRATE ER 30 MG PO TB24
30.0000 mg | ORAL_TABLET | Freq: Every day | ORAL | Status: DC
  Administered 2013-11-24 – 2013-11-25 (×2): 30 mg via ORAL
  Filled 2013-11-23 (×2): qty 1

## 2013-11-23 MED ORDER — INSULIN ASPART 100 UNIT/ML IV SOLN
10.0000 [IU] | Freq: Once | INTRAVENOUS | Status: AC
  Administered 2013-11-23: 10 [IU] via INTRAVENOUS
  Filled 2013-11-23: qty 0.1

## 2013-11-23 MED ORDER — SODIUM CHLORIDE 0.9 % IV SOLN
1.0000 g | Freq: Once | INTRAVENOUS | Status: AC
  Administered 2013-11-23: 1 g via INTRAVENOUS
  Filled 2013-11-23: qty 10

## 2013-11-23 MED ORDER — ONDANSETRON 8 MG PO TBDP
8.0000 mg | ORAL_TABLET | Freq: Three times a day (TID) | ORAL | Status: DC | PRN
  Filled 2013-11-23: qty 1

## 2013-11-23 MED ORDER — ACETAMINOPHEN 325 MG PO TABS
ORAL_TABLET | ORAL | Status: AC
  Filled 2013-11-23: qty 2

## 2013-11-23 MED ORDER — GABAPENTIN 300 MG PO CAPS
600.0000 mg | ORAL_CAPSULE | Freq: Every day | ORAL | Status: DC
  Administered 2013-11-24 – 2013-11-25 (×2): 600 mg via ORAL
  Filled 2013-11-23 (×4): qty 2

## 2013-11-23 MED ORDER — VITAMIN B-12 1000 MCG PO TABS
1000.0000 ug | ORAL_TABLET | Freq: Every day | ORAL | Status: DC
  Administered 2013-11-24 – 2013-11-25 (×2): 1000 ug via ORAL
  Filled 2013-11-23 (×2): qty 1

## 2013-11-23 MED ORDER — DEXTROSE 50 % IV SOLN
50.0000 mL | Freq: Once | INTRAVENOUS | Status: AC
  Administered 2013-11-23: 50 mL via INTRAVENOUS
  Filled 2013-11-23: qty 50

## 2013-11-23 MED ORDER — VITAMIN B-12 500 MCG PO TABS: 1000.0000 ug | ORAL_TABLET | Freq: Every day | ORAL | Status: DC

## 2013-11-23 MED ORDER — GABAPENTIN 300 MG PO CAPS
600.0000 mg | ORAL_CAPSULE | ORAL | Status: DC
  Administered 2013-11-24: 600 mg via ORAL
  Filled 2013-11-23 (×2): qty 2

## 2013-11-23 MED ORDER — INSULIN ASPART 100 UNIT/ML ~~LOC~~ SOLN
0.0000 [IU] | Freq: Three times a day (TID) | SUBCUTANEOUS | Status: DC
  Administered 2013-11-24: 2 [IU] via SUBCUTANEOUS

## 2013-11-23 MED ORDER — METOPROLOL TARTRATE 12.5 MG HALF TABLET
12.5000 mg | ORAL_TABLET | Freq: Two times a day (BID) | ORAL | Status: DC
  Administered 2013-11-23 – 2013-11-25 (×4): 12.5 mg via ORAL
  Filled 2013-11-23 (×7): qty 1

## 2013-11-23 MED ORDER — SODIUM CHLORIDE 0.9 % IV BOLUS (SEPSIS)
500.0000 mL | Freq: Once | INTRAVENOUS | Status: AC
  Administered 2013-11-23: 500 mL via INTRAVENOUS

## 2013-11-23 MED ORDER — SODIUM CHLORIDE 0.9 % IV SOLN
INTRAVENOUS | Status: DC
  Administered 2013-11-23: 1000 mL via INTRAVENOUS
  Administered 2013-11-24: 11:00:00 via INTRAVENOUS

## 2013-11-23 MED ORDER — SODIUM POLYSTYRENE SULFONATE 15 GM/60ML PO SUSP
30.0000 g | Freq: Once | ORAL | Status: AC
  Administered 2013-11-23: 30 g via ORAL

## 2013-11-23 MED ORDER — ACYCLOVIR 400 MG PO TABS
400.0000 mg | ORAL_TABLET | Freq: Two times a day (BID) | ORAL | Status: DC
  Administered 2013-11-23 – 2013-11-25 (×4): 400 mg via ORAL
  Filled 2013-11-23 (×6): qty 1

## 2013-11-23 MED ORDER — SODIUM CHLORIDE 0.9 % IV SOLN
375.0000 mg/m2 | Freq: Once | INTRAVENOUS | Status: AC
  Administered 2013-11-23: 800 mg via INTRAVENOUS
  Filled 2013-11-23: qty 80

## 2013-11-23 MED ORDER — DIPHENHYDRAMINE HCL 25 MG PO CAPS
50.0000 mg | ORAL_CAPSULE | Freq: Once | ORAL | Status: AC
  Administered 2013-11-23: 50 mg via ORAL

## 2013-11-23 NOTE — Progress Notes (Signed)
Received call from lab stating that K+ elevated @ 7 & asking about specimen collection.  Specimen was drawn from right arm, good stick, excellent blood return with butterfly needle & no IVF in that arm.  Lab will rerun specimen & call desk nurse to notify Dr Cyndie Chime.  EKG ordered by Dr. Cyndie Chime & this was done & reviewed by Dr Cyndie Chime.  Lab specimen redrawn & sent for stat K+.  Dr Cyndie Chime talked with pt.  Rituxan stopped per Dr. Patsy Lager orders.

## 2013-11-23 NOTE — Telephone Encounter (Signed)
Gave pt appt for md , january 2015 gave pt oral contrast for CT

## 2013-11-23 NOTE — ED Notes (Addendum)
Pt from the cancer center, doctor states that pt had a high potassium level. Pt is A&O x 4, has no complaints, and no pain.

## 2013-11-23 NOTE — Progress Notes (Signed)
Hematology and Oncology Follow Up Visit  [name] [id number] Aug 06, 1927 79 y.o. 11/23/2013 2:53 PM   Principle Diagnosis: Encounter Diagnoses  Name Primary?  . Mantle cell lymphoma of intra-abdominal lymph nodes Yes  . Thrombocytopenia       Interim History:   Routine followup visit for this 79 year old man currently on maintenance immunotherapy with single agent Rituxan for Mantle cell lymphoma. Initial diagnosis March 2013 presented with pancytopenia and splenomegaly. Initial treatment with single agent bendamustine and currently on Rituxan maintenance every 2 months with a plan for 4 doses. He is due for his final dose today. He has been doing very well. Still having some problems with his feet but no active infection. He required amputations of the toes of this year secondary to advanced diabetic ulcers with probably osteomyelitis.  Laboratory unexpectedly showed a potassium of 7.2. Repeat venipuncture was done and potassium remains elevated at 7.0. BUN 37, creatinine 1.6, bicarbonate 19, anion gap 7. Lab on October 20 BUN 23 creatinine 1.2 potassium 4.9, on October 14 BUN 14, creatinine 0.8, potassium 3.9.  He is on 3 medications that may cause increased potassium: Losartan, Bactrim prophylaxis, and Mobic. Electrocardiogram today shows sinus bradycardia first degree AV block no peaked P. or T waves. Of note he has underlying coronary artery disease 15 years status post bypass surgery in 2005 in one year status post coronary stent placement February 2013 followed by Dr. Elijah [name] wall.  I will get him immediately to the emergency department to receive insulin, glucose, and calcium.  Medications: reviewed  Allergies:  Allergies  Allergen Reactions  . Azithromycin Itching  . Doxycycline   . Macrodantin Itching  . Minocycline Hcl Itching  . Nitrofurantoin Itching  . Zithromax [Azithromycin Dihydrate] Itching  . Contrast Media [Iodinated Diagnostic Agents] Rash    Review of  Systems: Hematology: No bleeding or bruising ENT ROS: No sore throat Breast ROS:  Respiratory ROS: No cough or dyspnea Cardiovascular ROS:   No chest pain or palpitations Gastrointestinal ROS: No abdominal pain Genito-Urinary ROS Musculoskeletal ROS no joint deformities Neurological ROS: Alert and oriented hard of hearing no focal deficit Dermatological ROS: No rash Remaining ROS negative  Physical Exam: Blood pressure 136/49, pulse 54, temperature 97.1 F (36.2 C), temperature source Oral, resp. rate 18, height 6' (1.829 m), weight 191 lb 1.6 oz (86.682 kg). Wt Readings from Last 3 Encounters:  11/23/13 191 lb 1.6 oz (86.682 kg)  11/09/13 193 lb 6.4 oz (87.726 kg)  09/24/13 189 lb 8 oz (85.957 kg)     General appearance:  [name]: Pharynx no erythema, exudate, mass, or ulcer. No thyromegaly or thyroid nodules Lymph nodes: No cervical, supraclavicular, or axillary lymphadenopathy Breasts:  Lungs: Clear to auscultation, resonant to percussion throughout Heart: Regular rhythm, aortic systolic and mitral regurgitant murmur , no gallop, no rub, no click, no edema Abdomen: Soft, nontender, normal bowel sounds, no mass, no organomegaly Extremities: No edema, no calf tenderness Musculoskeletal: no joint deformities GU: Vascular: Carotid pulses 2+, no bruits,  Neurologic: Alert, oriented, PERRLA, , cranial nerves grossly normal, motor strength 5 over 5, reflexes 1+ symmetric, upper body coordination normal, gait normal, Skin: No rash or ecchymosis  Lab Results: CBC W/Diff    Component Value Date/Time   WBC 3.5* 11/23/2013 0945   WBC 4.5 09/15/2013 1040   RBC 3.54* 11/23/2013 0945   RBC 3.11* 09/15/2013 1040   RBC 2.83* 02/03/2012 0029   HGB 12.0* 11/23/2013 0945   HGB 10.6* 09/15/2013 1040  HCT 36.1* 11/23/2013 0945   HCT 31.1* 09/15/2013 1040   PLT 102* 11/23/2013 0945   PLT 89* 09/15/2013 1040   MCV 102.0* 11/23/2013 0945   MCV 100.0 09/15/2013 1040   MCH 33.9* 11/23/2013  0945   MCH 34.1* 09/15/2013 1040   MCHC 33.2 11/23/2013 0945   MCHC 34.1 09/15/2013 1040   RDW 15.0* 11/23/2013 0945   RDW 14.7 09/15/2013 1040   LYMPHSABS 0.9 11/23/2013 0945   LYMPHSABS 0.7 09/12/2013 2140   MONOABS 0.4 11/23/2013 0945   MONOABS 1.0 09/12/2013 2140   EOSABS 0.1 11/23/2013 0945   EOSABS 0.0 09/12/2013 2140   BASOSABS 0.0 11/23/2013 0945   BASOSABS 0.0 09/12/2013 2140     Chemistry      Component Value Date/Time   NA 137 11/23/2013 0945   NA 135 09/18/2013 0455   K 7.2* 11/23/2013 0945   K 3.9 09/18/2013 0455   CL 103 09/18/2013 0455   CL 108* 05/30/2013 0935   CO2 19* 11/23/2013 0945   CO2 23 09/18/2013 0455   BUN 37.4* 11/23/2013 0945   BUN 14 09/18/2013 0455   CREATININE 1.6* 11/23/2013 0945   CREATININE 0.84 09/18/2013 0455      Component Value Date/Time   CALCIUM 10.2 11/23/2013 0945   CALCIUM 9.7 09/18/2013 0455   ALKPHOS 126 11/23/2013 0945   ALKPHOS 112 09/13/2013 0415   AST 23 11/23/2013 0945   AST 15 09/13/2013 0415   ALT 19 11/23/2013 0945   ALT 9 09/13/2013 0415   BILITOT 0.41 11/23/2013 0945   BILITOT 0.4 09/13/2013 0415       Radiological Studies: No results found.  Impression:  #1. Severe hyperkalemia Urgent transfer to the emergency department for treatment. I consulted the covering physician for his internist, [name], and nephrology, [name] and I informed the emergency department physician [name] that the patient is in route to the ED. Stop Mobic, Dr., and losartan  #2. Mantle cell lymphoma in stable partial remission We will reschedule his Rituxan treatment. Followup CT scans after the new year.  #3.. Advanced coronary artery disease status post bypass surgery status post stent  #4. Obstructive airway disease  #5. Type 2 diabetes  #6. Peripheral vascular disease  #7. Postherpetic neuralgia on chronic gabapentin and prophylactic Zovirax  #7. Hyperlipidemia  #7. History of atrial flutter  #9. Status post left  forefoot amputations for diabetic ischemic toes.     CC: Patient Care Team: [name], MD as PCP - General (Internal Medicine)   Levert Feinstein, MD 12/19/20142:53 PM

## 2013-11-23 NOTE — ED Provider Notes (Addendum)
CSN: [id number]     Arrival date & time 11/23/13  1525 History   First MD Initiated Contact with Patient 11/23/13 1526     Chief Complaint  Patient presents with  . hyperkalemia    (Consider location/radiation/quality/duration/timing/severity/associated sxs/prior Treatment) Patient is a 79 y.o. male presenting with general illness. The history is provided by the patient.  Illness Location:  Found hyperkalemia in clinic today Quality:  Hyperkalemia Severity:  Severe Onset quality:  Gradual Duration: unknown. Timing:  Constant Progression:  Unchanged Chronicity:  New Context:  Found to be hyperkalemic in clinic today Relieved by:  Nothing Worsened by:  Nothing Associated symptoms: no abdominal pain, no chest pain, no cough, no fever, no nausea, no shortness of breath and no vomiting     Past Medical History  Diagnosis Date  . CAD (coronary artery disease)     a. s/p CABG 1995;  b. NSTEMI & subsequent BMS to SVG->right PDA 02/03/12.;  c. Cath 02/14/2012 3vd with 4/4 patent grafts and patent stent in vg->pda.  . Carotid stenosis     a. Carotid dopplers 2/13: RICA 40-59%, LICA 0-39%;  b. dopplers 3/14:  40-59% RICA, 0-39% LICA  . HTN (hypertension)   . HLD (hyperlipidemia)   . DM2 (diabetes mellitus, type 2)   . DJD (degenerative joint disease)   . Asthmatic bronchitis   . BPH (benign prostatic hypertrophy)   . Herpes zoster   . Anemia   . Thrombocytopenia     a. secondary to splenomegaly related to lymphoma with suspicion of bone marrow involvement. (Plavix had to be stopped due to this)  . Aortic stenosis, moderate     a. Echo 02/14/12 EF 55%, Gr 2 DD, Mod AS (mean grad: 29, peak grad 52)  . Splenomegaly 04/13/2012  . History of blood transfusion 04/13/2012  . 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 Dermatitis 06/07/2012    acnieform rash on back 06/06/12  . COPD (chronic obstructive pulmonary disease)   .  Peripheral vascular disease     a. s/p L ray amp 10/13;  b. s/p L 2nd toe amp 12/13  . Pulmonary nodule     8mm RUL calcified pulm nodule noted on CT staging for lymphoma - stable 10/2012.  . Leg DVT (deep venous thromboembolism), chronic     LE dopplers 5/13, 10/13 and 1/14: chronic DVT involving right mid femoral vein, left mid femoral vein, and left popliteal vein.  . Osteomyelitis     L first foot ray amputation 09/2012  . Gangrene     L 2nd toe amputation 11/2012   Past Surgical History  Procedure Laterality Date  . Coronary artery bypass graft  1995  . Rotator cuff repair    . Carpal tunnel release    . Incisional hernia repair    . Kidney surgery    . Back surgery    . I&d extremity  09/16/2012    Procedure: IRRIGATION AND DEBRIDEMENT EXTREMITY;  Surgeon: [name], MD;  Location: [facility];  Service: Orthopedics;  Laterality: Left;  irrigation and debridement and first Ray amputation of left foot  . Amputation  09/16/2012    Procedure: AMPUTATION RAY;  Surgeon: [name], MD;  Location: [facility];  Service: Orthopedics;  Laterality: Left;  first Ray amputation left foot  . Amputation  11/21/2012    Procedure: AMPUTATION RAY;  Surgeon: [name], MD;  Location: [facility] OR;  Service: Orthopedics;  Laterality: Left;  LEFT SECOND TOE AMPUTATION  . I&d extremity Left 03/01/2013    Procedure: IRRIGATION AND DEBRIDEMENT EXTREMITY;  Surgeon: [name], MD;  Location: [facility];  Service: Orthopedics;  Laterality: Left;  IRRIGATION  AND  DEBRIDEMENT  LEFT  FOOT   Family History  Problem Relation Age of Onset  . Coronary artery disease    . Alcohol abuse     History  Substance Use Topics  . Smoking status: Former Smoker -- 1.00 packs/day for 30 years    Types: Cigarettes, Pipe    Quit date: 03/29/1985  . Smokeless tobacco: Never Used     Comment: quit in 1986  . Alcohol Use: No    Review of Systems  Constitutional: Negative for fever.  Respiratory: Negative for cough and shortness  of breath.   Cardiovascular: Negative for chest pain and leg swelling.  Gastrointestinal: Negative for nausea, vomiting, abdominal pain and abdominal distention.  All other systems reviewed and are negative.    Allergies  Azithromycin; Doxycycline; Macrodantin; Minocycline hcl; Nitrofurantoin; Zithromax; and Contrast media  Home Medications   Current Outpatient Rx  Name  Route  Sig  Dispense  Refill  . acyclovir (ZOVIRAX) 400 MG tablet      TAKE ONE TABLET BY MOUTH TWICE DAILY TO  PREVENT  VIRUS  INFECTION   60 tablet   2   . aspirin EC 81 MG tablet   Oral   Take 81 mg by mouth every morning.          . gabapentin (NEURONTIN) 300 MG capsule   Oral   Take 300 mg by mouth at bedtime.          . hydrOXYzine (ATARAX/VISTARIL) 25 MG tablet      TID for itching         . isosorbide mononitrate (IMDUR) 30 MG 24 hr tablet      TAKE ONE TABLET BY MOUTH EVERY DAY   90 tablet   1   . losartan (COZAAR) 100 MG tablet   Oral   Take 1 tablet by mouth daily.          . meloxicam (MOBIC) 15 MG tablet   Oral   Take 15 mg by mouth daily.         . metFORMIN (GLUCOPHAGE) 500 MG tablet   Oral   Take 500 mg by mouth 2 (two) times daily with a meal.          . metoprolol tartrate (LOPRESSOR) 25 MG tablet      TAKE 1/2 TABLET TWICE DAILY         . ondansetron (ZOFRAN-ODT) 8 MG disintegrating tablet   Sublingual   Place 8 mg under the tongue every 8 (eight) hours as needed. For nausea.         . pravastatin (PRAVACHOL) 40 MG tablet   Oral   Take 40 mg by mouth daily.         Marland Kitchen sulfamethoxazole-trimethoprim (BACTRIM DS) 800-160 MG per tablet      BID for 21 days         . vitamin B-12 (CYANOCOBALAMIN) 500 MCG tablet   Oral   Take 1,000 mcg by mouth daily.           BP 132/97  Pulse 54  Temp(Src) 97.3 F (36.3 C) (Oral)  Resp 20  SpO2 100% Physical Exam  Nursing note and vitals reviewed. Constitutional: He is oriented to person, place, and  time. He  appears well-developed and well-nourished. No distress.  HENT:  Head: Normocephalic and atraumatic.  Mouth/Throat: No oropharyngeal exudate.  Eyes: EOM are normal. Pupils are equal, round, and reactive to light.  Neck: Normal range of motion. Neck supple.  Cardiovascular: Normal rate and regular rhythm.  Exam reveals no friction rub.   No murmur heard. Pulmonary/Chest: Effort normal and breath sounds normal. No respiratory distress. He has no wheezes. He has no rales.  Abdominal: He exhibits no distension. There is no tenderness. There is no rebound.  Musculoskeletal: Normal range of motion. He exhibits no edema.  Neurological: He is alert and oriented to person, place, and time. No cranial nerve deficit. He exhibits normal muscle tone.  Skin: No rash noted. He is not diaphoretic.    ED Course  Procedures (including critical care time) Labs Review Labs Reviewed  BASIC METABOLIC PANEL   Imaging Review No results found.  EKG Interpretation   None      Date: 11/23/2013  Rate: 51  Rhythm: sinus bradycardia  QRS Axis: left  Intervals: normal  ST/T Wave abnormalities: normal  Conduction Disutrbances:left bundle branch block  Narrative Interpretation:   Old EKG Reviewed: unchanged   CRITICAL CARE Performed by: Dagmar Hait   Total critical care time: 30 minutes  Critical care time was exclusive of separately billable procedures and treating other patients.  Critical care was necessary to treat or prevent imminent or life-threatening deterioration.  Critical care was time spent personally by me on the following activities: development of treatment plan with patient and/or surrogate as well as nursing, discussions with consultants, evaluation of patient's response to treatment, examination of patient, obtaining history from patient or surrogate, ordering and performing treatments and interventions, ordering and review of laboratory studies, ordering and  review of radiographic studies, pulse oximetry and re-evaluation of patient's condition.     MDM   1. Hyperkalemia   2. Elevated serum creatinine    9M presents from clinic with hyperkalemia. Repeated, still high. 7.2 on last check. Recently started Bactrim about 3 weeks ago for foot infection. EKG with sinus arrhythmia, T waves taller, no severe peaking. Denies other complaints, no SOB, no CP, no N/V/D. Patient relaxing comfortably, exam benign. Temporized with calcium gluconate, insulin, D50. Kayexalate given. [facility] consulted for admission.     Dagmar Hait, MD 11/23/13 1610  Dagmar Hait, MD 11/23/13 [phone]

## 2013-11-23 NOTE — Progress Notes (Signed)
D/C to ED. Via w/c with wife accompanied by NT & RN.  Report given to ED RN-Stacy & Dr. Cyndie Chime spoke with ED MD.

## 2013-11-23 NOTE — H&P (Signed)
Hospital Admission Note Date: 11/23/2013  Patient name: [name] Medical record number: [id number] Date of birth: 09-Apr-1927 Age: 79 y.o. Gender: male PCP: Garlan Fillers, MD  Attending physician: [name], MD  Chief Complaint: "Came to get my chemotherapy and suddenly I ended up in the hospital"  History of Present Illness: [name] is an 79 y.o. male with a history of mantle cell lymphoma, coronary artery disease, type 2 diabetes mellitus, and recent left foot ulceration and left elbow infected bursitis presented to receive chemotherapy with rituximab today and was noted to have potassium of 7.2. He has no complaints. He is on several medications to promote hyperkalemia including losartan, Septra, and Mobic. These have been held and he has been seen by Dr. Arlean Hopping of nephrology and he is being hydrated and treated with Kayexalate. Symptomatically this is working well with diarrheal stools. Potassium is pending for the a.m. Otherwise he feels well and we will resume other medications except for metformin because of renal insufficiency and will provide sliding scale NovoLog insulin  Past Medical History  Diagnosis Date  . CAD (coronary artery disease)     a. s/p CABG 1995;  b. NSTEMI & subsequent BMS to SVG->right PDA 02/03/12.;  c. Cath 02/14/2012 3vd with 4/4 patent grafts and patent stent in vg->pda.  . Carotid stenosis     a. Carotid dopplers 2/13: RICA 40-59%, LICA 0-39%;  b. dopplers 3/14:  40-59% RICA, 0-39% LICA  . HTN (hypertension)   . HLD (hyperlipidemia)   . DM2 (diabetes mellitus, type 2)   . DJD (degenerative joint disease)   . Asthmatic bronchitis   . BPH (benign prostatic hypertrophy)   . Herpes zoster   . Anemia   . Thrombocytopenia     a. secondary to splenomegaly related to lymphoma with suspicion of bone marrow involvement. (Plavix had to be stopped due to this)  . Aortic stenosis, moderate     a. Echo 02/14/12 EF 55%, Gr 2 DD, Mod AS (mean grad:  29, peak grad 52)  . Splenomegaly 04/13/2012  . History of blood transfusion 04/13/2012  . 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 Dermatitis 06/07/2012    acnieform rash on back 06/06/12  . COPD (chronic obstructive pulmonary disease)   . Peripheral vascular disease     a. s/p L ray amp 10/13;  b. s/p L 2nd toe amp 12/13  . Pulmonary nodule     8mm RUL calcified pulm nodule noted on CT staging for lymphoma - stable 10/2012.  . Leg DVT (deep venous thromboembolism), chronic     LE dopplers 5/13, 10/13 and 1/14: chronic DVT involving right mid femoral vein, left mid femoral vein, and left popliteal vein.  . Osteomyelitis     L first foot ray amputation 09/2012  . Gangrene     L 2nd toe amputation 11/2012   Meds: Prior to Admission medications   Medication Sig Start Date End Date Taking? Authorizing Provider  acyclovir (ZOVIRAX) 400 MG tablet TAKE ONE TABLET BY MOUTH TWICE DAILY TO  PREVENT  VIRUS  INFECTION 08/14/13  Yes Levert Feinstein, MD  aspirin EC 81 MG tablet Take 81 mg by mouth every morning.    Yes Historical Provider, MD  gabapentin (NEURONTIN) 300 MG capsule Take 300-600 mg by mouth 3 (three) times daily. Take 600 mg in the morning, 600 mg at midday, and 300 mg at bedtime. 03/03/13  Yes Gaspar Garbe, MD  hydrOXYzine (ATARAX/VISTARIL) 25 MG tablet 25 mg every 8 (eight) hours as needed for itching. TID for itching 10/25/13  Yes Historical Provider, MD  isosorbide mononitrate (IMDUR) 30 MG 24 hr tablet Take 30 mg by mouth daily.   Yes Historical Provider, MD  losartan (COZAAR) 100 MG tablet Take 1 tablet by mouth every morning.  10/17/11  Yes Historical Provider, MD  meloxicam (MOBIC) 15 MG tablet Take 15 mg by mouth daily.   Yes Historical Provider, MD  metFORMIN (GLUCOPHAGE) 500 MG tablet Take 500 mg by mouth 2 (two) times daily with a meal.    Yes Historical Provider, MD  metoprolol tartrate (LOPRESSOR) 25 MG  tablet Take 12.5 mg by mouth 2 (two) times daily.   Yes Historical Provider, MD  ondansetron (ZOFRAN-ODT) 8 MG disintegrating tablet Place 8 mg under the tongue every 8 (eight) hours as needed. For nausea. 04/13/12  Yes Historical Provider, MD  pravastatin (PRAVACHOL) 40 MG tablet Take 40 mg by mouth daily.   Yes Historical Provider, MD  PRESCRIPTION MEDICATION riTUXimab (RITUXAN) 800 mg in sodium chloride 0.9 % 250 mL chemo infusion 375 mg/m2  2.06 m2 (Treatment Plan Actual)  Once 11/23/2013   Yes Historical Provider, MD  sulfamethoxazole-trimethoprim (BACTRIM DS) 800-160 MG per tablet Take 1 tablet by mouth 2 (two) times daily. BID for 21 days 11/08/13  Yes Historical Provider, MD  vitamin B-12 (CYANOCOBALAMIN) 500 MCG tablet Take 1,000 mcg by mouth daily.    Yes Historical Provider, MD   Allergies: Allergies  Allergen Reactions  . Azithromycin Itching  . Doxycycline Itching       . Macrodantin Itching  . Minocycline Hcl Itching  . Nitrofurantoin Itching  . Zithromax [Azithromycin Dihydrate] Itching  . Contrast Media [Iodinated Diagnostic Agents] Rash   History   Social History  . Marital Status: Married    Spouse Name: N/A    Number of Children: N/A  . Years of Education: N/A   Occupational History  . retired [employer]   Social History Main Topics  . Smoking status: Former Smoker -- 1.00 packs/day for 30 years    Types: Cigarettes, Pipe    Quit date: 03/29/1985  . Smokeless tobacco: Never Used     Comment: quit in 1986  . Alcohol Use: No  . Drug Use: No  . Sexual Activity: Not Currently   Other Topics Concern  . Not on file   Social History Narrative  . No narrative on file   Family History  Problem Relation Age of Onset  . Coronary artery disease    . Alcohol abuse     Past Surgical History  Procedure Laterality Date  . Coronary artery bypass graft  1995  . Rotator cuff repair    . Carpal tunnel release    . Incisional hernia repair    . Kidney surgery     . Back surgery    . I&d extremity  09/16/2012    Procedure: IRRIGATION AND DEBRIDEMENT EXTREMITY;  Surgeon: [name], MD;  Location: [facility];  Service: Orthopedics;  Laterality: Left;  irrigation and debridement and first Ray amputation of left foot  . Amputation  09/16/2012    Procedure: AMPUTATION RAY;  Surgeon: [name], MD;  Location: [facility];  Service: Orthopedics;  Laterality: Left;  first Ray amputation left foot  . Amputation  11/21/2012    Procedure: AMPUTATION RAY;  Surgeon: [name], MD;  Location: [facility] OR;  Service: Orthopedics;  Laterality: Left;  LEFT SECOND TOE AMPUTATION  . I&d extremity Left 03/01/2013    Procedure: IRRIGATION AND DEBRIDEMENT EXTREMITY;  Surgeon: [name], MD;  Location: [facility];  Service: Orthopedics;  Laterality: Left;  IRRIGATION  AND  DEBRIDEMENT  LEFT  FOOT    Review of Systems: Neuro alert and oriented, nonfocal Resp no shortness of breath or cough Cardio no chest pain or palpitations GI no abdominal pain. Positive diarrhea since taking Kayexalate GU denies dysuria Endo no fevers or chills or sweats or cold or heat intolerance. He is a diabetic and [place]el no acute changes Derm no rashes  Physical Exam: Filed Vitals:   11/23/13 1536 11/23/13 1800  BP: 132/97   Pulse: 54 86  Temp: 97.3 F (36.3 C)   TempSrc: Oral   Resp: 20 18  Height:  6\' 1"  (1.854 m)  Weight:  85 kg (187 lb 6.3 oz)  SpO2: 100% 98%    General Appearance: Alert, cooperative, no distress, appears stated age Head: Normocephalic, without obvious abnormality, atraumatic Neck: Supple, symmetrical, trachea midline, no adenopathy; thyroid: No enlargement/tenderness/nodules; no carotid bruit or JVD Lungs: Clear to auscultation bilaterally, respirations unlabored Heart: Regular rate and rhythm, S1 and S2 normal, 3/6 systolic ejection murmur at the right sternal border Abdomen: Soft, non-tender, bowel sounds active all four quadrants, no masses, no  organomegaly Extremities: No edema or cyanosis. Left plantar foot wound well healed. No fluctuance. Left elbow without fluctuance or erythema or swelling Pulses: 1+ and symmetric all extremities Skin: Skin color, texture, turgor normal, no rashes or lesions Neuro: CNII-XII intact. Alert nonfocal   EKG: NSR, LBBB, t-waves slightly high   Lab results: Results for orders placed during the hospital encounter of 11/23/13  MRSA PCR SCREENING      Result Value Range   MRSA by PCR NEGATIVE  NEGATIVE  BASIC METABOLIC PANEL      Result Value Range   Sodium 135  135 - 145 mEq/L   Potassium 7.0 (*) 3.5 - 5.1 mEq/L   Chloride 107  96 - 112 mEq/L   CO2 20  19 - 32 mEq/L   Glucose, Bld 98  70 - 99 mg/dL   BUN 38 (*) 6 - 23 mg/dL   Creatinine, Ser 4.54 (*) 0.50 - 1.35 mg/dL   Calcium 09.8  8.4 - 11.9 mg/dL   GFR calc non Af Amer 38 (*) >90 mL/min   GFR calc Af Amer 44 (*) >90 mL/min  GLUCOSE, CAPILLARY      Result Value Range   Glucose-Capillary 85  70 - 99 mg/dL    Imaging results:  No results found.  Assessment & Plan:  Assessment/Plan:  1. Hyperkalemia: See orders per Dr. Arlean Hopping - appreciate nephrology consult 2. Acute renal insufficiency: also due to meds (arb, nsaid) and probably slight vol depletion > hold arb, nsaids, and discontinue Bactrim. Given Kayexalate and continue IV fluids overnight and check basic metabolic profile in a.m. 3. Mantle cell lymphoma - aware 4. DM2- hold metformin for now until creat down - sliding scale NovoLog insulin added 5. CAD hx CABG 6. Infected L elbow bursitis, Oct 2014 - healed, okay to discontinue Septra 7. DVT prophylaxis  Patient Active Problem List   Diagnosis Date Noted  . Hyperkalemia 11/23/2013  . Olecranon bursitis 09/12/2013  . Other pancytopenia 03/05/2013    Class: Acute  . Staphylococcus aureus bacteremia 03/03/2013  . Diabetic foot ulcer associated with type 2 diabetes mellitus 02/28/2013  . Fever and chills 02/28/2013  .  Type II or unspecified type diabetes mellitus with neurological manifestations, not stated as uncontrolled(250.60) 02/28/2013  . Mantle cell lymphoma 02/28/2013  . Carotid artery disease 02/28/2013  . Essential hypertension, benign 02/28/2013  . Aortic stenosis, moderate 02/28/2013  . CAD (coronary artery disease) 02/28/2013  . Hyponatremia 02/28/2013  . Gangrenous toe 11/21/2012  . Abnormal EKG 11/21/2012  . Cellulitis 09/13/2012    Class: Acute  . Peripheral arterial disease 09/13/2012    Class: Chronic  . Cellulitis of foot 09/07/2012  . Sepsis(995.91) 09/07/2012  . Pneumonitis 06/07/2012  . Dermatitis 06/07/2012  . 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 Splenomegaly 04/13/2012  . History of blood transfusion 04/13/2012  . Lymphoma 04/07/2012  . Neuralgia 03/16/2012  . BPH (benign prostatic hyperplasia) 03/16/2012  . Hyperlipidemia 03/16/2012  . Unspecified deficiency anemia 03/16/2012  . Moderate aortic stenosis 02/15/2012  . Thrombocytopenia   . Chest pain at rest 02/08/2012  . Pedal edema 02/08/2012  . Anemia 02/08/2012  . Coronary artery disease 12/21/2011  . Carotid bruit 12/21/2011  . DIABETES MELLITUS, TYPE II 12/19/2009  . HYPERLIPIDEMIA 12/19/2009  . HYPERTENSION 12/19/2009  . CEREBROVASCULAR DISEASE 12/19/2009     Pearla Dubonnet, MD 11/23/2013, 8:33 PM

## 2013-11-23 NOTE — Consult Note (Addendum)
Renal Service Consult Note [name]  [name] 11/23/2013 [name] Requesting Physician:  Dr [name]  Reason for Consult:  Patient with mantle cell lyphoma on chemoRx with high K+ HPI: The patient is a 79 y.o. year-old with hx of mantle cell lymphoma, finishing approx 1 year of chemo w Rituximab (was on bendamustine prior to that), has had recent problems with left foot ulcer infection and L elbow infected bursitis and has been getting Septra abx for that.  He takes Mobic (unsure what for) for years, and losartan. He was in MD's office for chemo and labs returned with K+ of 7.2.  Patient says he feels fine, no different than usual .  Denies sob, cp, n/v/d, fevers. +tremor which is not new, no gait change. No constipation.    Chart Review Hx CABG, DM2, HTN, HL 01/2012 > NSTEMI, cath showed open LIMA to LAD/SVG to LCx, severe disease SVG to PDA treated with BM stenting 06/2012 > acute pneumonitis, mantle cell lymphoma w ongoing active chemoRx (dx'd by LN bx 03/28/12), pancytopenia, COPD, DM2 09/2012 > left foot cellulitis / open foot wound, sepsis, thrombocytopenia felt due to splenomegaly from MCL stage III 09/2012 > left 1st ray amp for osteo/nonhealing ulcers 11/2012 > left 2nd ray amp for same Mar 2014 > I&D left foot plantar abcess, staph aureus bacteremia, MCL stage III, CAD, DM2 Oct 2014 > purulent olecranon bursitis, cx's+ staph aureus, treated with I&D and abx (Bactrim)  Date     Creatinine  eGFR Mar 2014  1.00   74 July 2014  1.0   76 October 2014  1.39 > 1.2  45-54 Nov 23, 2013  1.6   38  Echo 02/2013 > EF 50-55% Sep 13, 2013 UA > 30 prot, 3-6rbc, no wbc     Past Medical History  Past Medical History  Diagnosis Date  . CAD (coronary artery disease)     a. s/p CABG 1995;  b. NSTEMI & subsequent BMS to SVG->right PDA 02/03/12.;  c. Cath 02/14/2012 3vd with 4/4 patent grafts and patent stent in vg->pda.  . Carotid stenosis     a. Carotid  dopplers 2/13: RICA 40-59%, LICA 0-39%;  b. dopplers 3/14:  40-59% RICA, 0-39% LICA  . HTN (hypertension)   . HLD (hyperlipidemia)   . DM2 (diabetes mellitus, type 2)   . DJD (degenerative joint disease)   . Asthmatic bronchitis   . BPH (benign prostatic hypertrophy)   . Herpes zoster   . Anemia   . Thrombocytopenia     a. secondary to splenomegaly related to lymphoma with suspicion of bone marrow involvement. (Plavix had to be stopped due to this)  . Aortic stenosis, moderate     a. Echo 02/14/12 EF 55%, Gr 2 DD, Mod AS (mean grad: 29, peak grad 52)  . Splenomegaly 04/13/2012  . History of blood transfusion 04/13/2012  . 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 Dermatitis 06/07/2012    acnieform rash on back 06/06/12  . COPD (chronic obstructive pulmonary disease)   . Peripheral vascular disease     a. s/p L ray amp 10/13;  b. s/p L 2nd toe amp 12/13  . Pulmonary nodule     8mm RUL calcified pulm nodule noted on CT staging for lymphoma - stable 10/2012.  . Leg DVT (deep venous thromboembolism), chronic     LE dopplers 5/13, 10/13 and 1/14: chronic DVT involving  right mid femoral vein, left mid femoral vein, and left popliteal vein.  . Osteomyelitis     L first foot ray amputation 09/2012  . Gangrene     L 2nd toe amputation 11/2012   Past Surgical History  Past Surgical History  Procedure Laterality Date  . Coronary artery bypass graft  1995  . Rotator cuff repair    . Carpal tunnel release    . Incisional hernia repair    . Kidney surgery    . Back surgery    . I&d extremity  09/16/2012    Procedure: IRRIGATION AND DEBRIDEMENT EXTREMITY;  Surgeon: [name], MD;  Location: [facility];  Service: Orthopedics;  Laterality: Left;  irrigation and debridement and first Ray amputation of left foot  . Amputation  09/16/2012    Procedure: AMPUTATION RAY;  Surgeon: [name], MD;  Location: [facility];  Service: Orthopedics;   Laterality: Left;  first Ray amputation left foot  . Amputation  11/21/2012    Procedure: AMPUTATION RAY;  Surgeon: [name], MD;  Location[facility];  Service: Orthopedics;  Laterality: Left;  LEFT SECOND TOE AMPUTATION  . I&d extremity Left 03/01/2013    Procedure: IRRIGATION AND DEBRIDEMENT EXTREMITY;  Surgeon: [name], MD;  Location: [facility];  Service: Orthopedics;  Laterality: Left;  IRRIGATION  AND  DEBRIDEMENT  LEFT  FOOT   Family History  Family History  Problem Relation Age of Onset  . Coronary artery disease    . Alcohol abuse     Social History  reports that he quit smoking about 28 years ago. His smoking use included Cigarettes and Pipe. He has a 30 pack-year smoking history. He has never used smokeless tobacco. He reports that he does not drink alcohol or use illicit drugs. Allergies  Allergies  Allergen Reactions  . Azithromycin Itching  . Doxycycline Itching       . Macrodantin Itching  . Minocycline Hcl Itching  . Nitrofurantoin Itching  . Zithromax [Azithromycin Dihydrate] Itching  . Contrast Media [Iodinated Diagnostic Agents] Rash   Home medications Prior to Admission medications   Medication Sig Start Date End Date Taking? Authorizing Provider  acyclovir (ZOVIRAX) 400 MG tablet TAKE ONE TABLET BY MOUTH TWICE DAILY TO  PREVENT  VIRUS  INFECTION 08/14/13  Yes Levert Feinstein, MD  aspirin EC 81 MG tablet Take 81 mg by mouth every morning.    Yes Historical Provider, MD  gabapentin (NEURONTIN) 300 MG capsule Take 300-600 mg by mouth 3 (three) times daily. Take 600 mg in the morning, 600 mg at midday, and 300 mg at bedtime. 03/03/13  Yes Gaspar Garbe, MD  hydrOXYzine (ATARAX/VISTARIL) 25 MG tablet 25 mg every 8 (eight) hours as needed for itching. TID for itching 10/25/13  Yes Historical Provider, MD  isosorbide mononitrate (IMDUR) 30 MG 24 hr tablet Take 30 mg by mouth daily.   Yes Historical Provider, MD  losartan (COZAAR) 100 MG tablet Take 1 tablet by  mouth every morning.  10/17/11  Yes Historical Provider, MD  meloxicam (MOBIC) 15 MG tablet Take 15 mg by mouth daily.   Yes Historical Provider, MD  metFORMIN (GLUCOPHAGE) 500 MG tablet Take 500 mg by mouth 2 (two) times daily with a meal.    Yes Historical Provider, MD  metoprolol tartrate (LOPRESSOR) 25 MG tablet Take 12.5 mg by mouth 2 (two) times daily.   Yes Historical Provider, MD  ondansetron (ZOFRAN-ODT) 8 MG disintegrating tablet Place 8 mg under the tongue  every 8 (eight) hours as needed. For nausea. 04/13/12  Yes Historical Provider, MD  pravastatin (PRAVACHOL) 40 MG tablet Take 40 mg by mouth daily.   Yes Historical Provider, MD  PRESCRIPTION MEDICATION riTUXimab (RITUXAN) 800 mg in sodium chloride 0.9 % 250 mL chemo infusion 375 mg/m2  2.06 m2 (Treatment Plan Actual)  Once 11/23/2013   Yes Historical Provider, MD  sulfamethoxazole-trimethoprim (BACTRIM DS) 800-160 MG per tablet Take 1 tablet by mouth 2 (two) times daily. BID for 21 days 11/08/13  Yes Historical Provider, MD  vitamin B-12 (CYANOCOBALAMIN) 500 MCG tablet Take 1,000 mcg by mouth daily.    Yes Historical Provider, MD   Liver Function Tests  Recent Labs Lab 11/23/13 0945  AST 23  ALT 19  ALKPHOS 126  BILITOT 0.41  PROT 6.3*  ALBUMIN 4.2   No results found for this basename: LIPASE, AMYLASE,  in the last 168 hours CBC  Recent Labs Lab 11/23/13 0945  WBC 3.5*  NEUTROABS 2.1  HGB 12.0*  HCT 36.1*  MCV 102.0*  PLT 102*   Basic Metabolic Panel  Recent Labs Lab 11/23/13 0945 11/23/13 1555  NA 137 135  K 7.2* 7.0*  CL  --  107  CO2 19* 20  GLUCOSE 95 98  BUN 37.4* 38*  CREATININE 1.6* 1.61*  CALCIUM 10.2 10.2    Exam  Blood pressure 132/97, pulse 54, temperature 97.3 F (36.3 C), temperature source Oral, resp. rate 20, SpO2 100.00%.  gen: elderly pleasant WM, no distress  skin: no rash, cyanosis  heent: eomi, sclera anicteric, throat moist  neck: no jvd, flat neck veins  chest: clear bilat   cor: regular, harsh 3/6 SEM RUSB > carotids, pedal pulses intact  abd: soft, nontender, no ascites   ext: no LE or UE edema, no joint effusion, healing wound L plantar foot, L elbow fully healed  neuro: alert, ox3, nonfocal  EKG: NSR, LBBB, t-waves slightly high  Assessment/Plan: 1. Hyperkalemia: due to combination of medications, particularly Septra, Mobic and losartan.  Would stop Mobic and avoid all nsaid's, stop losartan and substitute with norvasc 5-10/d for BP.  He only has a few Septra left, would stop this as well. Give kayexalate and IVF"s as you're doing, restrict K intake until back in normal range. Low K diet ordered.    2. Acute renal insufficiency: also due to meds (arb, nsaid) and probably slight vol depletion > hold arb, nsaids and give ivf 3. Mantle cell lymphoma 4. DM2- hold metformin for now until creat down 5. CAD hx CABG 6. Infected L elbow bursitis, Oct 2014  Will follow.    Vinson Moselle MD (pgr) [phone]    ([phone] 11/23/2013, 4:41 PM

## 2013-11-23 NOTE — Progress Notes (Signed)
Pt may have received @ 125 ml of rituxan before stopping.

## 2013-11-24 LAB — GLUCOSE, CAPILLARY
Glucose-Capillary: 89 mg/dL (ref 70–99)
Glucose-Capillary: 103 mg/dL — ABNORMAL HIGH (ref 70–99)
Glucose-Capillary: 118 mg/dL — ABNORMAL HIGH (ref 70–99)

## 2013-11-24 LAB — BASIC METABOLIC PANEL
BUN: 29 mg/dL — ABNORMAL HIGH (ref 6–23)
BUN: 20 mg/dL (ref 6–23)
CO2: 20 mEq/L (ref 19–32)
CO2: 21 mEq/L (ref 19–32)
Chloride: 107 mEq/L (ref 96–112)
Chloride: 109 mEq/L (ref 96–112)
GFR calc non Af Amer: 45 mL/min — ABNORMAL LOW (ref >90)
Glucose, Bld: 185 mg/dL — ABNORMAL HIGH (ref 70–99)
Potassium: 5.6 mEq/L — ABNORMAL HIGH (ref 3.5–5.1)
Potassium: 4.6 mEq/L (ref 3.5–5.1)
Sodium: 137 mEq/L (ref 135–145)

## 2013-11-24 MED ORDER — SODIUM POLYSTYRENE SULFONATE 15 GM/60ML PO SUSP
30.0000 g | Freq: Once | ORAL | Status: AC
  Administered 2013-11-24: 30 g via ORAL
  Filled 2013-11-24: qty 120

## 2013-11-24 NOTE — Progress Notes (Signed)
Subjective: Patient anxious for discharge, denies any issues with chest pain, palpitations, presyncope, denies nausea, vomiting, abdominal pain, had multiple bowel movements with Kayexalate throughout the night, denies any cramping, denies orthopnea. Extensive questions at answered from the patient and wife regarding current medications that may have caused hyperkalemia.  Objective: Vital signs in last 24 hours: Temp:  [97.3 F (36.3 C)-98.2 F (36.8 C)] 98 F (36.7 C) (12/20 0800) Pulse Rate:  [40-86] 59 (12/20 1000) Resp:  [13-20] 17 (12/20 1000) BP: (132-158)/(45-130) 152/58 mmHg (12/20 1000) SpO2:  [94 %-100 %] 96 % (12/20 1000) Weight:  [85 kg (187 lb 6.3 oz)-86.1 kg (189 lb 13.1 oz)] 86.1 kg (189 lb 13.1 oz) (12/20 0600) Weight change:  Last BM Date: 11/24/13  CBG (last 3)   Recent Labs  11/23/13 1711 11/23/13 2107 11/24/13 0808  GLUCAP 85 151* 121*    Intake/Output from previous day: 12/19 0701 - 12/20 0700 In: 2279.6 [P.O.:240; I.V.:1539.6; IV Piggyback:500] Out: 1225 [Urine:1225] Intake/Output this shift:   Physical exam No apparent distress pleasant, answering all questions appropriately alert and oriented x3 Sclera anicteric No oropharyngeal lesions Neck supple, no cervical lymphadenopathy Lungs clear to auscultation bilaterally with no respiratory distress Cardiovascular reveals regular rate and rhythm with murmur Abdomen soft, nontender, nondistended Extremities reveal no edema, or cyanosis, wound area on left foot, well-healed, no fluctuance or erythema, no discharge, bilateral elbows without erythema or swelling No resting tremors, nonfocal exam   Lab Results:  Recent Labs  11/23/13 1555 11/24/13 0355  NA 135 137  K 7.0* 5.6*  CL 107 107  CO2 20 20  GLUCOSE 98 124*  BUN 38* 29*  CREATININE 1.61* 1.40*  CALCIUM 10.2 9.6    Recent Labs  11/23/13 0945  AST 23  ALT 19  ALKPHOS 126  BILITOT 0.41  PROT 6.3*  ALBUMIN 4.2    Recent Labs  11/23/13 0945  WBC 3.5*  NEUTROABS 2.1  HGB 12.0*  HCT 36.1*  MCV 102.0*  PLT 102*   No results found for this basename: CKTOTAL, CKMB, CKMBINDEX, TROPONINI,  in the last 72 hours No results found for this basename: TSH, T4TOTAL, FREET3, T3FREE, THYROIDAB,  in the last 72 hours No results found for this basename: VITAMINB12, FOLATE, FERRITIN, TIBC, IRON, RETICCTPCT,  in the last 72 hours  Studies/Results: No results found.   Medications: Scheduled: . acyclovir  400 mg Oral BID  . aspirin EC  81 mg Oral Daily  . enoxaparin (LOVENOX) injection  40 mg Subcutaneous Q24H  . gabapentin  300 mg Oral QHS  . gabapentin  600 mg Oral QAC breakfast  . gabapentin  600 mg Oral Q24H  . insulin aspart  0-15 Units Subcutaneous TID WC  . isosorbide mononitrate  30 mg Oral Daily  . metoprolol tartrate  12.5 mg Oral BID  . vitamin B-12  1,000 mcg Oral Daily   Continuous: . sodium chloride 125 mL/hr at 11/24/13 1111    Assessment/Plan: Active Problems:   Hyperkalemia unfortunately did not have complete resolution of hyperkalemia with plans initiated in the emergency room and per nephrology, potassium level still slightly elevated, given age and other comorbidities and hesitant to discharge today without complete resolution, we'll continue to hold ARB, Bactrim, and NSAIDs, will give one more dose of Kayexalate, recheck renal parameters including electrolytes this evening, and again in the morning for stability, will continue IV fluids, no evidence of volume overload Acute renal insufficiency-improved overnight with IV fluids we'll continue the same, no  evidence of volume overload, most recent ejection fraction normal at 55% Thank you diabetes stable on sliding scale insulin with metformin being held Coronary artery disease with history CABG, hemodynamically stable, no exertional difficulties and no review of systems or findings consistent with active ischemia Left elbow bursitis, left foot  infection, on prolonged course of Septra, no evidence of infection at this time, we'll discontinue Disposition, will followup in the next 24 hours, and hopefully will discharge the morning with resolution of hyperkalemia.    LOS: 1 day   Deon Duer R 11/24/2013, 11:12 AM

## 2013-11-25 LAB — BASIC METABOLIC PANEL
CO2: 22 mEq/L (ref 19–32)
Calcium: 9.3 mg/dL (ref 8.4–10.5)
Chloride: 112 mEq/L (ref 96–112)
GFR calc Af Amer: 77 mL/min — ABNORMAL LOW (ref >90)
Potassium: 4.8 mEq/L (ref 3.5–5.1)
Sodium: 141 mEq/L (ref 135–145)

## 2013-11-25 LAB — GLUCOSE, CAPILLARY: Glucose-Capillary: 113 mg/dL — ABNORMAL HIGH (ref 70–99)

## 2013-11-25 NOTE — Discharge Summary (Signed)
DISCHARGE SUMMARY  [name]  MR#: [id number]  DOB:1927-02-21  Date of Admission: 11/23/2013 Date of Discharge: 11/25/2013  Attending Physician:Melquan Ernsberger R  Patient's NFA:[id number],QIONGE G, MD  Consults:Treatment Team:  [name], MD  Discharge Diagnoses: Active Problems:   Hyperkalemia   Discharge Medications:   Medication List    STOP taking these medications       losartan 100 MG tablet  Commonly known as:  COZAAR     meloxicam 15 MG tablet  Commonly known as:  MOBIC     sulfamethoxazole-trimethoprim 800-160 MG per tablet  Commonly known as:  BACTRIM DS      TAKE these medications       acyclovir 400 MG tablet  Commonly known as:  ZOVIRAX  TAKE ONE TABLET BY MOUTH TWICE DAILY TO  PREVENT  VIRUS  INFECTION     aspirin EC 81 MG tablet  Take 81 mg by mouth every morning.     gabapentin 300 MG capsule  Commonly known as:  NEURONTIN  Take 300-600 mg by mouth 3 (three) times daily. Take 600 mg in the morning, 600 mg at midday, and 300 mg at bedtime.     hydrOXYzine 25 MG tablet  Commonly known as:  ATARAX/VISTARIL  25 mg every 8 (eight) hours as needed for itching. TID for itching     isosorbide mononitrate 30 MG 24 hr tablet  Commonly known as:  IMDUR  Take 30 mg by mouth daily.     metFORMIN 500 MG tablet  Commonly known as:  GLUCOPHAGE  Take 500 mg by mouth 2 (two) times daily with a meal.     metoprolol tartrate 25 MG tablet  Commonly known as:  LOPRESSOR  Take 12.5 mg by mouth 2 (two) times daily.     ondansetron 8 MG disintegrating tablet  Commonly known as:  ZOFRAN-ODT  Place 8 mg under the tongue every 8 (eight) hours as needed. For nausea.     pravastatin 40 MG tablet  Commonly known as:  PRAVACHOL  Take 40 mg by mouth daily.     PRESCRIPTION MEDICATION  riTUXimab (RITUXAN) 800 mg in sodium chloride 0.9 % 250 mL chemo infusion 375 mg/m2  2.06 m2 (Treatment Plan Actual)  Once 11/23/2013     vitamin B-12 500 MCG  tablet  Commonly known as:  CYANOCOBALAMIN  Take 1,000 mcg by mouth daily.        History of Present Illness: [name] is an 79 y.o. male with a history of mantle cell lymphoma, coronary artery disease, type 2 diabetes mellitus, and recent left foot ulceration and left elbow infected bursitis presented to receive chemotherapy with rituximab today and was noted to have potassium of 7.2. He has no complaints. He is on several medications to promote hyperkalemia including losartan, Septra, and Mobic. These have been held and he has been seen by Dr. Arlean Hopping of nephrology and he is being hydrated and treated with Kayexalate. Symptomatically this is working well with diarrheal stools. Potassium is pending for the a.m. Otherwise he feels well and we will resume other medications except for metformin because of renal insufficiency and will provide sliding scale NovoLog insulin   Hospital Course: Patient admitted solely for his hyperkalemia, which by the next morning after initiation and being treated with Kayexalate, insulin and calcium gluconate, was still above 6, patient was continued on IV fluids, with subsequent drop in his potassium level to below 5 on 2 different measurements, urethritis time patient is  asymptomatic, did not have palpitations, presyncope, chest pain, muscle spasms, twitching, or tremors. He was anxious for discharge, we did we had an extensive discussion regarding low potassium diets, as well as complete cessation of ARB therapy including losartan, NSAIDs including meloxicam, and discontinuation of sulfa antibiotics for which he was being treated for elbow olecranon bursitis/cellulitis as well as foot infection which have completely resolved clinically.  Day of Discharge Exam BP 149/52  Pulse 56  Temp(Src) 98.3 F (36.8 C) (Oral)  Resp 16  Ht 6\' 1"  (1.854 m)  Wt 85.095 kg (187 lb 9.6 oz)  BMI 24.76 kg/m2  SpO2 96%  Physical Exam: No apparent distress pleasant,  answering all questions appropriately alert and oriented x3  Sclera anicteric  No oropharyngeal lesions  Neck supple, no cervical lymphadenopathy  Lungs clear to auscultation bilaterally with no respiratory distress  Cardiovascular reveals regular rate and rhythm with murmur  Abdomen soft, nontender, nondistended  Extremities reveal no edema, or cyanosis, wound area on left foot, well-healed, no fluctuance or erythema, no discharge, bilateral elbows without erythema or swelling  No resting tremors, nonfocal exam   Discharge Labs:  Recent Labs  11/24/13 1857 11/25/13 0455  NA 138 141  K 4.6 4.8  CL 109 112  CO2 21 22  GLUCOSE 185* 121*  BUN 20 18  CREATININE 1.08 1.01  CALCIUM 9.1 9.3    Recent Labs  11/23/13 0945  AST 23  ALT 19  ALKPHOS 126  BILITOT 0.41  PROT 6.3*  ALBUMIN 4.2    Recent Labs  11/23/13 0945  WBC 3.5*  NEUTROABS 2.1  HGB 12.0*  HCT 36.1*  MCV 102.0*  PLT 102*   No results found for this basename: CKTOTAL, CKMB, CKMBINDEX, TROPONINI,  in the last 72 hours No results found for this basename: TSH, T4TOTAL, FREET3, T3FREE, THYROIDAB,  in the last 72 hours No results found for this basename: VITAMINB12, FOLATE, FERRITIN, TIBC, IRON, RETICCTPCT,  in the last 72 hours  Discharge instructions:     Discharge Orders   Future Appointments Provider Department Dept Phone   12/03/2013 8:00 AM [facility] [phone]   Please pick up your oral contrast prep at your scheduled location at least 1 day prior to your appointment. CT Pelvis to eval bone or for kidney stones: No oral contrast prep needed. Please arrive 15 min prior to your scheduled exam time.   12/18/2013 10:00 AM Levert Feinstein, MD [facility] MEDICAL ONCOLOGY [phone]   02/12/2014 8:45 AM [name], MD [facility] [place] Office [phone]   Future Orders Complete By Expires   Call MD for:  persistant nausea and  vomiting  As directed    Diet - low sodium heart healthy  As directed    Discharge instructions  As directed    Comments:     Please have your basic metabolic panel checked in Dr. Norval Gable office on December 23, schedule followup visit in approximately one-2 weeks, monitor blood pressure at home, if systolic blood pressure is greater than 150 on a routine basis, please call the office for initiation of medications to take the place of losartan   Increase activity slowly  As directed       Disposition: Improved, discharge to home  Follow-up Appts: Follow-up with Dr. Eloise Harman at [facility] in 1-2 weeks  Call for appointment.  Condition on Discharge: Improved  Tests Needing Follow-up: BMET 11/27/13  Signed: Chilton Greathouse R 11/25/2013,  9:49 AM

## 2013-11-25 NOTE — Progress Notes (Signed)
Patient heart rate in the 50s and patient has order for metoprolol 12.5mg , notified [name] and he stated its okay to give the medication.

## 2013-11-25 NOTE — Progress Notes (Signed)
Patient discharged home with wife, discharge instructions given and explained to patient/wife and they verbalized understanding, denies  any pain/distress. No wound noted, skin intact.

## 2013-11-27 ENCOUNTER — Telehealth: Payer: Self-pay | Admitting: *Deleted

## 2013-11-27 ENCOUNTER — Other Ambulatory Visit: Payer: Self-pay | Admitting: *Deleted

## 2013-11-27 NOTE — Telephone Encounter (Signed)
Pt's wife called this am stating that pt was home from the hospital & has a CT scheduled for 12/03/13 & wants to know since rituxan was not completed 11/23/13, should CT be postponed?  Discussed with Lonna Cobb NP & POF routed to Scheduler to r/s chemo to 12/31 or 12/07/13 & move CT to 12/14/13 before MD visit.

## 2013-12-03 ENCOUNTER — Ambulatory Visit (HOSPITAL_COMMUNITY): Payer: Medicare Other

## 2013-12-13 ENCOUNTER — Encounter (HOSPITAL_COMMUNITY): Payer: Self-pay

## 2013-12-14 ENCOUNTER — Telehealth: Payer: Self-pay | Admitting: Oncology

## 2013-12-14 NOTE — Telephone Encounter (Signed)
Pt's wife called and cancelled appt for 1/13th, talked to [facility] and she is aware that pt's wife left VM

## 2013-12-15 IMAGING — CT CT ABD-PELV W/O CM
2 of 4 series · 16 of 46 positions shown, 18 images · non-contrast
Comparison: 03/04/2013

CT CHEST

CLINICAL DATA: Restaging lymphoma

CT CHEST, ABDOMEN AND PELVIS WITHOUT CONTRAST
TECHNIQUE: Multidetector CT imaging of the chest, abdomen and
pelvis was performed following the standard protocol without IV
contrast.

[Series 2: cap w/o w/o st · axial · non-contrast · 0.82mm/px · z∈[-598,+17]mm · 13 of 137 slices shown, 15 images]
[im 7/137  soft-tissue]
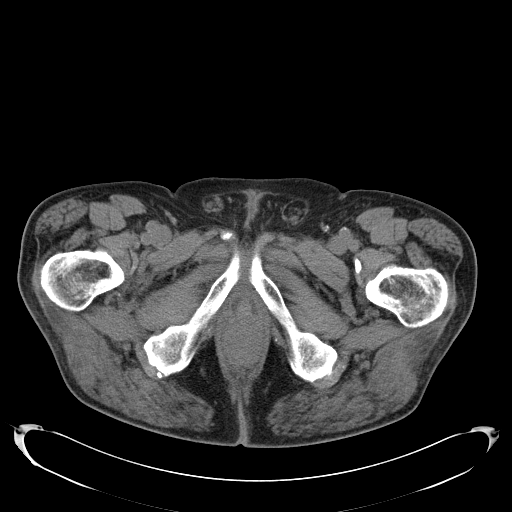
[im 7/137  bone]
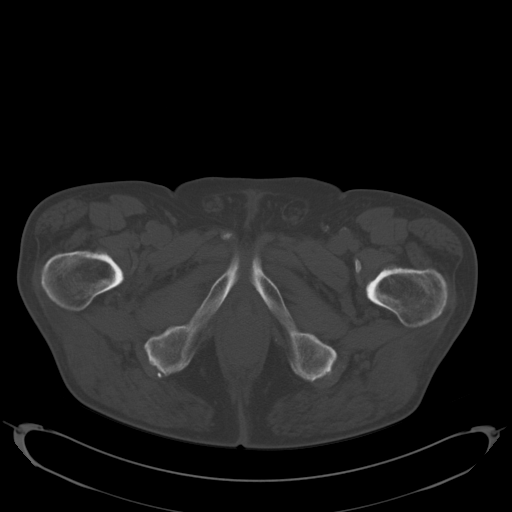
[im 20/137  soft-tissue]
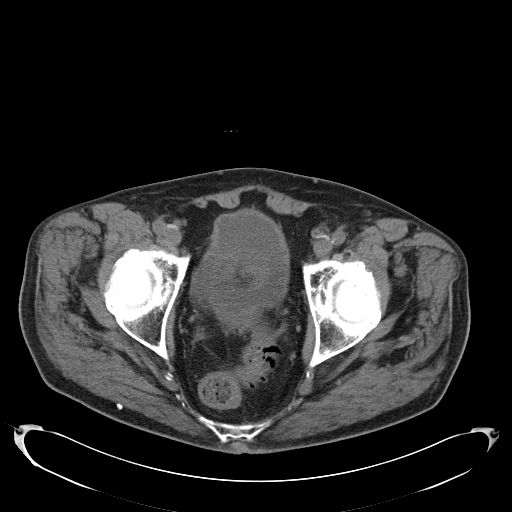
[im 26/137  soft-tissue]
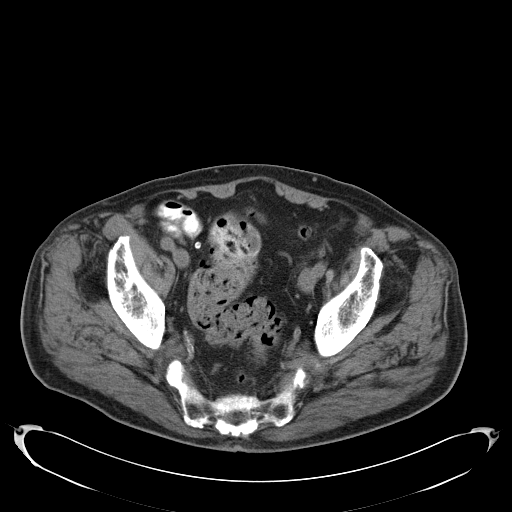
[im 39/137  soft-tissue]
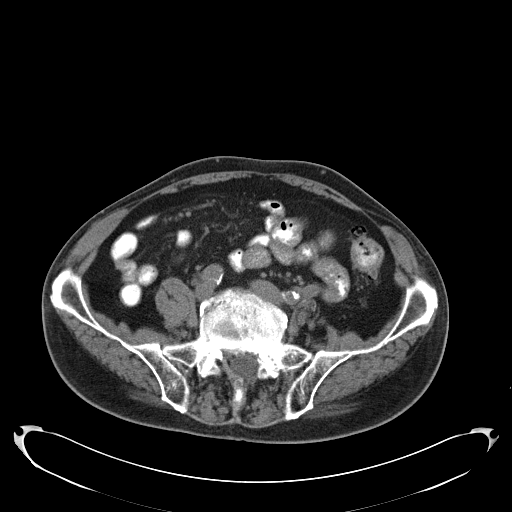
[im 46/137  soft-tissue]
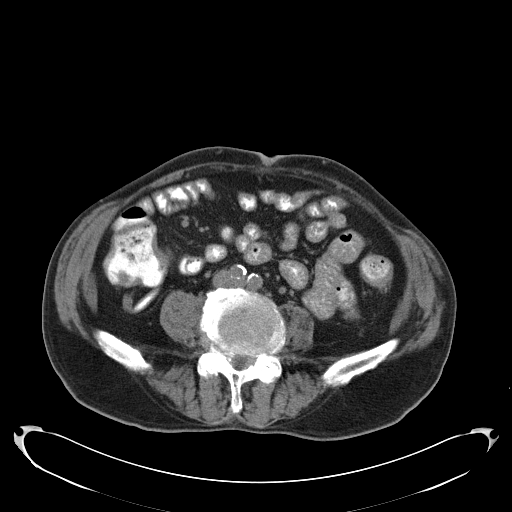
[im 59/137  soft-tissue]
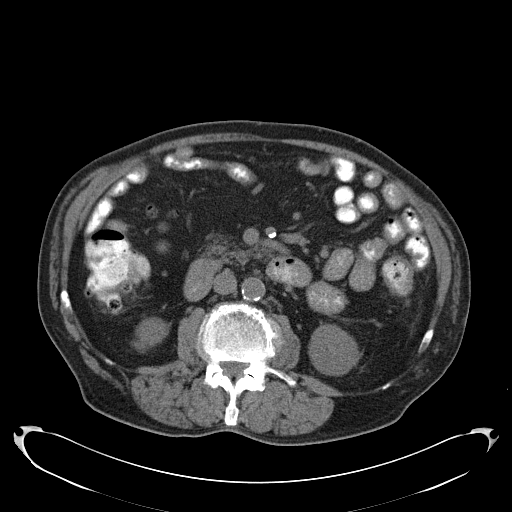
[im 72/137  soft-tissue]
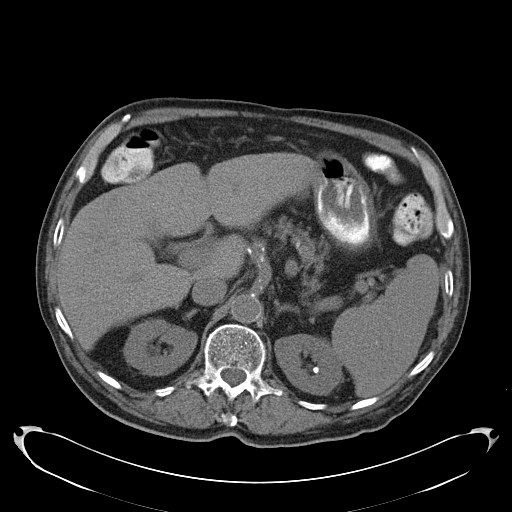
[im 78/137  soft-tissue]
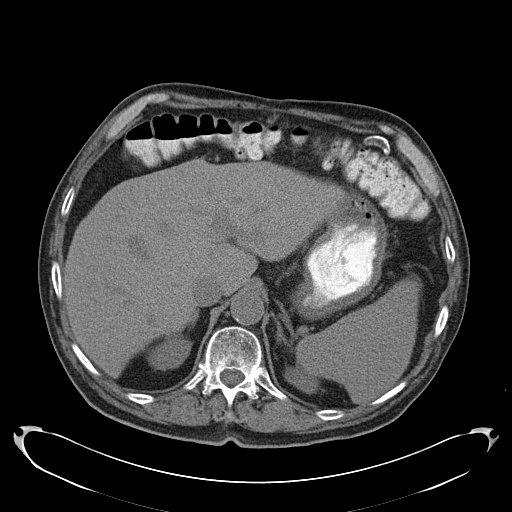
[im 91/137  soft-tissue]
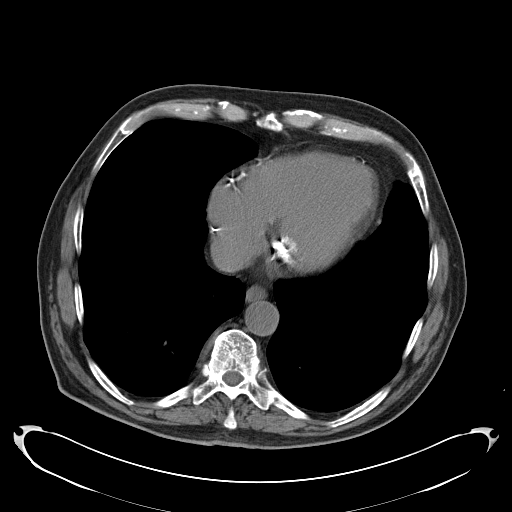
[im 91/137  bone]
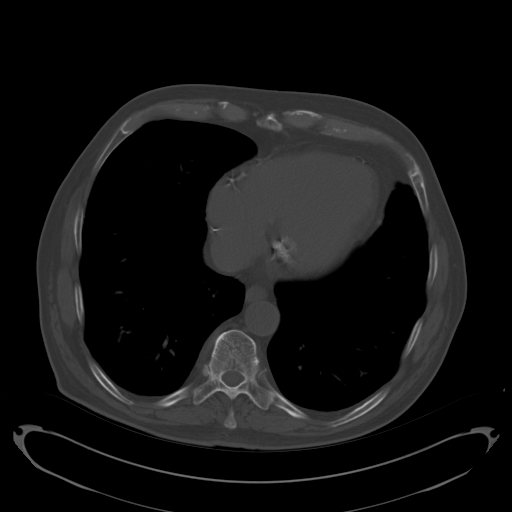
[im 98/137  soft-tissue]
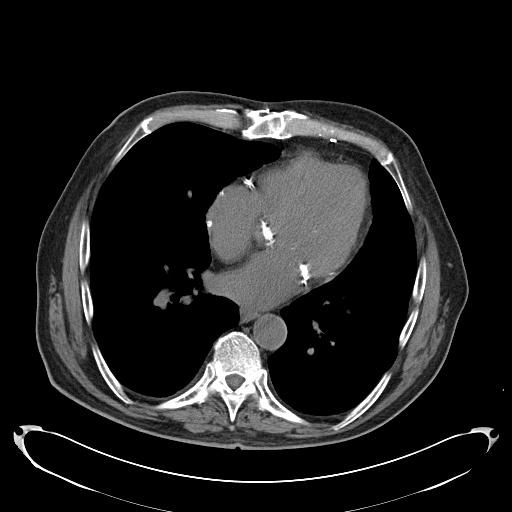
[im 111/137  soft-tissue]
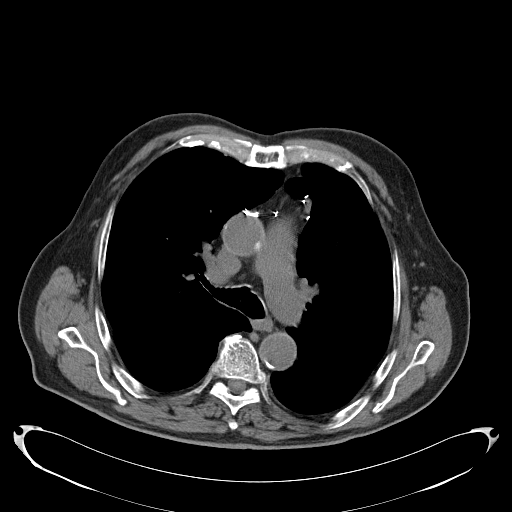
[im 117/137  soft-tissue]
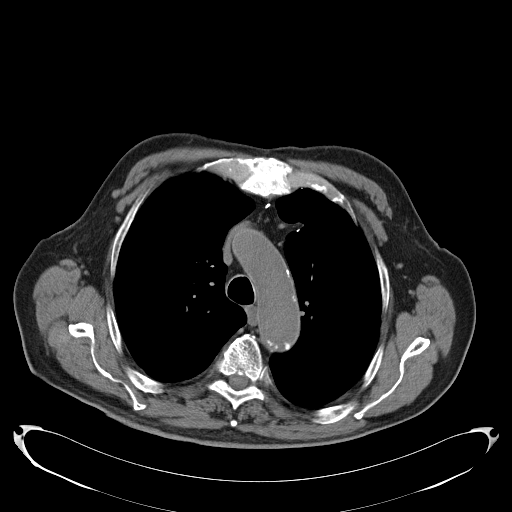
[im 130/137  soft-tissue]
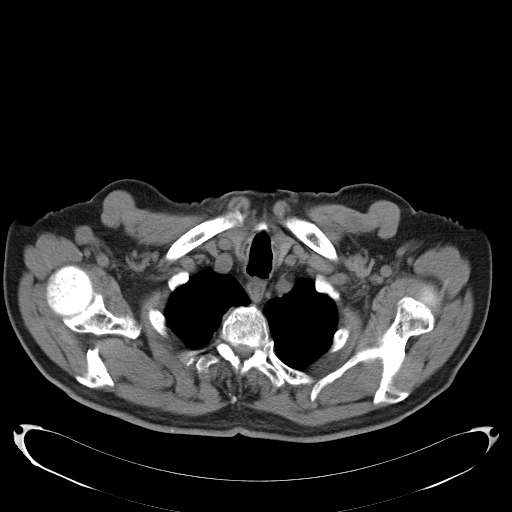

[Series 602: <mpr thick range> · coronal · 1.34mm/px · 3 of 96 slices shown]
[im 32/96  soft-tissue]
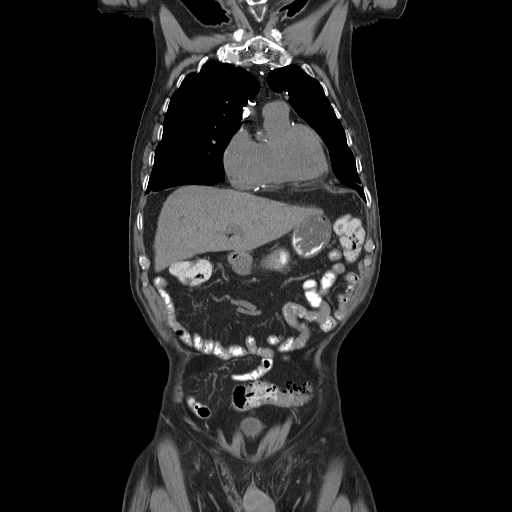
[im 43/96  soft-tissue]
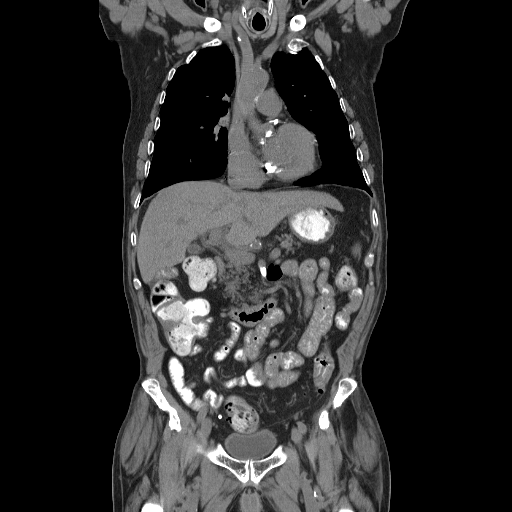
[im 53/96  soft-tissue]
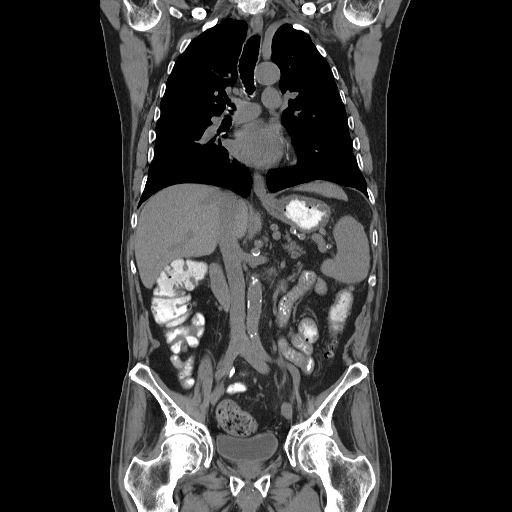

[16 of 46 positions shown; findings below may reference images not displayed]

FINDINGS: There is no pleural effusion identified.  Right upper
lobe sub solid nodule measures 11 mm, image 14/series 4.  This
appears unchanged from previous exam.  Nodule in the superior
segment of the right lower lobe measures 8 mm and is also
unchanged, image 30/series 4.  There is a 4 mm nodule in the left
lower lobe, image 41/series 4.  This is stable from previous study.

Heart size is normal.  Status post median sternotomy and CABG
procedure.  No pericardial effusion.  No enlarged mediastinal or
hilar adenopathy.

No axillary or supraclavicular adenopathy.

Review of the visualized osseous structures is significant for mild
thoracic spondylosis.
IMPRESSION: 1.  No acute findings.
2.  No evidence for mass or adenopathy.
3.  Pulmonary nodules are unchanged from previous exam.

CT ABDOMEN AND PELVIS
FINDINGS: There is a normal appearance of the liver.  No focal
liver abnormalities.  The gallbladder is normal.  No biliary
dilatation.  Normal appearance of the pancreas. The spleen is
enlarged measuring 16 cm in craniocaudal dimension.  This is
similar to previous exam.

Normal appearance of both adrenal glands.  Small nonobstructing
stone within the mid pole of left kidney measures 2-3 mm, image
70/series 2.  There are multiple left renal calculi.  The largest
is in the upper pole measuring 5 mm, image 66/series 2.  Urinary
bladder appears normal.  The prostate gland and seminal vesicles
appear normal.

There is calcified atherosclerotic disease affecting the abdominal
aorta.  No aneurysm.  There is no enlarged lymph node within the
upper abdomen.  There is no pelvic or inguinal adenopathy.

The stomach is normal.  The small bowel loops are normal in course
and caliber.  No obstruction.  The appendix is visualized and
appears normal.  Multiple colonic diverticula are identified.  No
acute inflammation.

No free fluid or abnormal fluid collections identified within the
abdomen or pelvis.

Spondylosis is present throughout the lumbar spine.
IMPRESSION: 1.  No acute findings.  There is no mass or adenopathy identified.
2.  Persistent splenomegaly.
3.  Nonobstructing renal calculi.

## 2013-12-15 NOTE — Pre-Procedure Instructions (Signed)
[name]  12/15/2013   Your procedure is scheduled on:  January 15  Report to [facility] Entrance "A" [address] at 1:00 PM.  Call this number if you have problems the morning of surgery: [phone]   Remember:   Do not eat food or drink liquids after midnight.   Take these medicines the morning of surgery with A SIP OF WATER: Keflex, Gabapentin, Isosorbide, Metoprolol, Zofran (if needed)   STOP Vitamin B12, Aspirin,   Do not wear jewelry..  Do not wear lotions, powders, or cologne. You may wear deodorant.  Men may shave face and neck.  Do not bring valuables to the hospital.  [facility] is not responsible for any belongings or valuables.               Contacts, dentures or bridgework may not be worn into surgery.  Leave suitcase in the car. After surgery it may be brought to your room.  For patients admitted to the hospital, discharge time is determined by your treatment team.               Patients discharged the day of surgery will not be allowed to drive home.  Name and phone number of your driver: Family/ Friend  Special Instructions: Shower using CHG 2 nights before surgery and the night before surgery.  If you shower the day of surgery use CHG.  Use special wash - you have one bottle of CHG for all showers.  You should use approximately 1/3 of the bottle for each shower.   Please read over the following fact sheets that you were given: Pain Booklet, Coughing and Deep Breathing and Surgical Site Infection Prevention

## 2013-12-17 ENCOUNTER — Encounter (HOSPITAL_COMMUNITY): Payer: Self-pay

## 2013-12-17 ENCOUNTER — Encounter (HOSPITAL_COMMUNITY)
Admission: RE | Admit: 2013-12-17 | Discharge: 2013-12-17 | Disposition: A | Discharge status: Home / Self Care | Payer: Medicare Other | Source: Ambulatory Visit | Attending: Orthopedic Surgery | Admitting: Orthopedic Surgery

## 2013-12-17 LAB — CBC
HCT: 34.5 % — ABNORMAL LOW (ref 39.0–52.0)
HEMOGLOBIN: 11.4 g/dL — AB (ref 13.0–17.0)
MCH: 33.2 pg (ref 26.0–34.0)
MCHC: 33 g/dL (ref 30.0–36.0)
MCV: 100.6 fL — AB (ref 78.0–100.0)
Platelets: 107 10*3/uL — ABNORMAL LOW (ref 150–400)
RBC: 3.43 MIL/uL — AB (ref 4.22–5.81)
RDW: 14 % (ref 11.5–15.5)
WBC: 4 10*3/uL (ref 4.0–10.5)

## 2013-12-17 LAB — COMPREHENSIVE METABOLIC PANEL
ALT: 14 U/L (ref 0–53)
AST: 16 U/L (ref 0–37)
Albumin: 3.7 g/dL (ref 3.5–5.2)
Alkaline Phosphatase: 115 U/L (ref 39–117)
BILIRUBIN TOTAL: 0.3 mg/dL (ref 0.3–1.2)
BUN: 22 mg/dL (ref 6–23)
CHLORIDE: 104 meq/L (ref 96–112)
CO2: 25 meq/L (ref 19–32)
CREATININE: 1 mg/dL (ref 0.50–1.35)
Calcium: 9.4 mg/dL (ref 8.4–10.5)
GFR calc Af Amer: 78 mL/min — ABNORMAL LOW (ref >90)
GFR, EST NON AFRICAN AMERICAN: 67 mL/min — AB (ref >90)
GLUCOSE: 182 mg/dL — AB (ref 70–99)
Potassium: 4.2 mEq/L (ref 3.7–5.3)
Sodium: 142 mEq/L (ref 137–147)
Total Protein: 6 g/dL (ref 6.0–8.3)

## 2013-12-17 NOTE — Progress Notes (Signed)
Anesthesia Note:  Patient is a 80 year old male scheduled for left 5th transmetatarsal amputation, left achilles tendon lengthening on 12/20/13 by Dr. Wylene Simmer.  History includes former smoker, DM2, CAD s/p CABG '05 with NSTEMI s/p BMS to SVG to right PDA 02/03/12, moderate AS/AR by 02/2013 echo, COPD, chronic LLE DVT, mantle cell lymphoma 05/2012 s/p chemotherapy (Rituxin), splenomegaly with thrombocytopenia (felt related to lymphoma with suspicion of bone marrow involvement), HTN, HLD, anemia, pulmonary nodule, PAD, left foot cellulitis 09/2012 followed by left 1st ray amputation 09/2012 and left 2nd ray amputation 11/2012, and ID left foot plantar abscess 02/2013, I&D infected left olecranon bursitis 09/2013, mild to moderate carotid stenosis, hospitalization on 11/23/13 for severe hyperkalemia (7.2).  He did report being treated with Tamiflu on 12/11/13 for possible influenza.  He has an occasional cough, but otherwise feels well without fever or SOB. PCP is Dr. Evalyn Casco at [facility]. HEM-ONC has been Dr. Beryle Beams. Cardiologist is now Dr. [name], previously Dr. Verl Blalock. His last visit was on 08/10/13. Patient was doing well at that time with plans for six month follow-up.  EKG on 11/23/13 (done during admission for hyperkalemia) showed SB @ 53 bpm, marked sinus arrhythmia, first degree AVB, borderline LAD, non-specific intraventricular block versus left BBB.   EKG was repeated today due to further QRS widening and more pronounced T wave abnormality in 11/2013.  Today's EKG showed irregular rhythm--question of afib versus frequent PAC's.  T wave abnormality in V4-6 appears improved.  QRS is widened at 0.134, with appearance of non-specific intraventricular block.  Patient denies history of afib.  He denies chest pain, SOB, new edema, palpitations, syncope, pre-syncope.  He is on b-blocker therapy.  I called and spoke with Dr. Burt Knack about EKG findings and plans for surgery.  He reviewed EKG himself and  felt that underlying rhythm was sinus, but with frequent PACs.  He did not have any additional preoperatively recommendations.  Echo on 03/04/13 showed: - Left ventricle: The cavity size was normal. Wall thickness was increased in a pattern of moderate LVH. Systolic function was normal. The estimated ejection fraction was in the range of 50% to 55%. Wall motion was normal; there were no regional wall motion abnormalities. Doppler parameters are consistent with abnormal left ventricular relaxation (grade 1 diastolic dysfunction). - Aortic valve: There was moderate stenosis. Moderate regurgitation. Valve area: 1.53cm^2(VTI). Valve area: 1.48cm^2 (Vmax). Valve area: 1.42cm^2 (Vmean). - Mitral valve: Calcified annulus. Mild regurgitation. - Left atrium: The atrium was moderately dilated. - Pulmonic valve: Trivial regurgitation. - Tricuspid valve: Mild regurgitation. - Right atrium: The atrium was mildly dilated. - Pulmonary arteries: PA peak pressure: 34mm Hg (S).  Cardiac cath on 02/14/12 showed:  1. Severe three-vessel native coronary artery disease. LM is now occluded. There was scant flow through this previously.  2. Wide patency of the LIMA to LAD and saphenous vein graft sequenced to obtuse marginal branches left circumflex. Widely patent recent stent in the SVG to the RCA. Continued medical therapy was recommended.  Carotid duplex on 4/78/29 showed 56-21% LICA stenosis, 3-08% RICA stenosis.  CXR on 12/11/13 ([facility]) showed no active disease.  Preoperative labs noted.  K 4.2.  PLT cont 107K which appears at or better than his baseline since 12/2012.    On exam, he appears well.  Wife at side. Heart sounds are fairly regular with only an occasional ectopic beat.  He does have a harsh 3/6 murmur.  Lungs clear without wheezing.  Mild LLE edema (no  change per patient).  He denies any active CV/CHF symptoms.  EKG reviewed with Dr. Burt Knack today.  Latest labs and CXR appear acceptable for OR.  If no acute  changes then I would anticipate that he could proceed as planned.  [facility]/Anesthesiology Phone [phone] 12/17/2013 5:32 PM

## 2013-12-17 NOTE — Progress Notes (Signed)
Called Dr. Renelda Loma office for orders.   [place] for copy of most recent CXR.  Spoke with Ebony Hail regarding seeing this patient prior to leaving the PAT appt.   Pt does have a cough.  DA

## 2013-12-18 ENCOUNTER — Ambulatory Visit: Payer: Medicare Other | Admitting: Oncology

## 2013-12-19 ENCOUNTER — Other Ambulatory Visit: Payer: Self-pay | Admitting: Orthopedic Surgery

## 2013-12-19 MED ORDER — CEFAZOLIN SODIUM-DEXTROSE 2-3 GM-% IV SOLR
2.0000 g | INTRAVENOUS | Status: AC
  Administered 2013-12-20: 2 g via INTRAVENOUS
  Filled 2013-12-19: qty 50

## 2013-12-19 MED ORDER — ACETAMINOPHEN 500 MG PO TABS
1000.0000 mg | ORAL_TABLET | Freq: Once | ORAL | Status: AC
  Administered 2013-12-20: 1000 mg via ORAL
  Filled 2013-12-19: qty 2

## 2013-12-19 NOTE — Progress Notes (Signed)
Surgery time changed. Patient to arrive at 1300.called and spoke with wife [name].

## 2013-12-19 NOTE — H&P (Signed)
[name] is an 80 y.o. male.   Chief Complaint: Left 5th toe infection HPI: Pt reports to OR for left foot transmetatarsal amputation and achilles lengthening for treatment of osteomyelitis found in left 5th toe.  Pt denies N/V/F/C, chest pain, SOB, calf pain or changes in appetite.  Past Medical History  Diagnosis Date  . CAD (coronary artery disease)     a. s/p CABG 1995;  b. NSTEMI & subsequent BMS to SVG->right PDA 02/03/12.;  c. Cath 02/14/2012 3vd with 4/4 patent grafts and patent stent in vg->pda.  . Carotid stenosis     a. Carotid dopplers 2/44: RICA 01-02%, LICA 7-25%;  b. dopplers 3/14:  36-64% RICA, 4-03% LICA  . HTN (hypertension)   . HLD (hyperlipidemia)   . DM2 (diabetes mellitus, type 2)   . DJD (degenerative joint disease)   . Asthmatic bronchitis   . BPH (benign prostatic hypertrophy)   . Herpes zoster   . Anemia   . Thrombocytopenia     a. secondary to splenomegaly related to lymphoma with suspicion of bone marrow involvement. (Plavix had to be stopped due to this)  . Aortic stenosis, moderate     a. Echo 02/14/12 EF 55%, Gr 2 DD, Mod AS (mean grad: 29, peak grad 52)  . Splenomegaly 04/13/2012  . History of blood transfusion 04/13/2012  . 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 Dermatitis 06/07/2012    acnieform rash on back 06/06/12  . COPD (chronic obstructive pulmonary disease)   . Peripheral vascular disease     a. s/p L ray amp 10/13;  b. s/p L 2nd toe amp 12/13  . Pulmonary nodule     37mm RUL calcified pulm nodule noted on CT staging for lymphoma - stable 10/2012.  . Leg DVT (deep venous thromboembolism), chronic     LE dopplers 5/13, 10/13 and 1/14: chronic DVT involving right mid femoral vein, left mid femoral vein, and left popliteal vein.  . Osteomyelitis     L first foot ray amputation 09/2012  . Gangrene     L 2nd toe amputation 11/2012    Past Surgical History  Procedure  Laterality Date  . Coronary artery bypass graft  1995  . Rotator cuff repair    . Carpal tunnel release    . Incisional hernia repair    . Kidney surgery    . Back surgery    . I&d extremity  09/16/2012    Procedure: IRRIGATION AND DEBRIDEMENT EXTREMITY;  Surgeon: Wylene Simmer, MD;  Location: [facility];  Service: Orthopedics;  Laterality: Left;  irrigation and debridement and first Ray amputation of left foot  . Amputation  09/16/2012    Procedure: AMPUTATION RAY;  Surgeon: Wylene Simmer, MD;  Location: [facility];  Service: Orthopedics;  Laterality: Left;  first Ray amputation left foot  . Amputation  11/21/2012    Procedure: AMPUTATION RAY;  Surgeon: Wylene Simmer, MD;  Location: [place];  Service: Orthopedics;  Laterality: Left;  LEFT SECOND TOE AMPUTATION  . I&d extremity Left 03/01/2013    Procedure: IRRIGATION AND DEBRIDEMENT EXTREMITY;  Surgeon: Wylene Simmer, MD;  Location: [facility];  Service: Orthopedics;  Laterality: Left;  IRRIGATION  AND  DEBRIDEMENT  LEFT  FOOT    Family History  Problem Relation Age of Onset  . Coronary artery disease    . Alcohol abuse     Social History:  reports that he quit  smoking about 28 years ago. His smoking use included Cigarettes and Pipe. He has a 30 pack-year smoking history. He has never used smokeless tobacco. He reports that he does not drink alcohol or use illicit drugs.  Allergies:  Allergies  Allergen Reactions  . Losartan     Hyperkalemia > 7  . Azithromycin Itching  . Doxycycline Itching       . Macrodantin Itching  . Minocycline Hcl Itching  . Nitrofurantoin Itching  . Zithromax [Azithromycin Dihydrate] Itching  . Contrast Media [Iodinated Diagnostic Agents] Rash    No prescriptions prior to admission    No results found for this or any previous visit (from the past 71 hour(s)). No results found.  Review of Systems  Constitutional: Negative.   HENT: Negative.   Eyes: Negative.   Respiratory: Negative.   Cardiovascular: Negative.    Gastrointestinal: Negative.   Musculoskeletal: Negative.   Skin: Negative.   Neurological: Negative for dizziness.  Endo/Heme/Allergies: Negative.   Psychiatric/Behavioral: The patient is not nervous/anxious.     There were no vitals taken for this visit. Physical Exam  Open ulcer note on left 5th toe.  Ulcer is draining with exposed bone present. Diminished sensation to light touch to midfoot and distally.  DP pulses not able to palpate.  Distal toes are well perfused.   Assessment/Plan Pt reports to OR for left transmetatarsal amputation for treatment of left 5th toe osteomyelitis. The risks and benefits of the alternative treatment options have been discussed in detail.  The patient wishes to proceed with surgery and specifically understands risks of bleeding, infection, nerve damage, blood clots, need for additional surgery, amputation and death.  FLOWERS, CHRISTOPHER S 12/19/2013, 4:58 PM  Agree wit H and P above.  To OR for left transmetatarsal amputation and heel cord lengthening.  The risks and benefits of the alternative treatment options have been discussed in detail.  The patient wishes to proceed with surgery and specifically understands risks of bleeding, infection, nerve damage, blood clots, need for additional surgery, amputation and death.

## 2013-12-20 ENCOUNTER — Observation Stay (HOSPITAL_COMMUNITY)
Admission: RE | Admit: 2013-12-20 | Discharge: 2013-12-21 | Disposition: A | Discharge status: Home / Self Care | Payer: Medicare Other | Source: Ambulatory Visit | Attending: Orthopedic Surgery | Admitting: Orthopedic Surgery

## 2013-12-20 ENCOUNTER — Telehealth: Payer: Self-pay | Admitting: *Deleted

## 2013-12-20 ENCOUNTER — Ambulatory Visit (HOSPITAL_COMMUNITY): Payer: Medicare Other | Admitting: Anesthesiology

## 2013-12-20 ENCOUNTER — Encounter (HOSPITAL_COMMUNITY): Payer: Medicare Other | Admitting: Vascular Surgery

## 2013-12-20 ENCOUNTER — Encounter (HOSPITAL_COMMUNITY): Payer: Self-pay | Admitting: Certified Registered"

## 2013-12-20 ENCOUNTER — Encounter (HOSPITAL_COMMUNITY)
Admission: RE | Disposition: A | Discharge status: Home / Self Care | Payer: Self-pay | Source: Ambulatory Visit | Attending: Orthopedic Surgery

## 2013-12-20 DIAGNOSIS — E785 Hyperlipidemia, unspecified: Secondary | ICD-10-CM | POA: Insufficient documentation

## 2013-12-20 DIAGNOSIS — Z7982 Long term (current) use of aspirin: Secondary | ICD-10-CM | POA: Insufficient documentation

## 2013-12-20 DIAGNOSIS — N400 Enlarged prostate without lower urinary tract symptoms: Secondary | ICD-10-CM | POA: Insufficient documentation

## 2013-12-20 DIAGNOSIS — Z951 Presence of aortocoronary bypass graft: Secondary | ICD-10-CM | POA: Insufficient documentation

## 2013-12-20 DIAGNOSIS — M9080 Osteopathy in diseases classified elsewhere, unspecified site: Secondary | ICD-10-CM | POA: Insufficient documentation

## 2013-12-20 DIAGNOSIS — J449 Chronic obstructive pulmonary disease, unspecified: Secondary | ICD-10-CM | POA: Insufficient documentation

## 2013-12-20 DIAGNOSIS — D696 Thrombocytopenia, unspecified: Secondary | ICD-10-CM | POA: Insufficient documentation

## 2013-12-20 DIAGNOSIS — J4489 Other specified chronic obstructive pulmonary disease: Secondary | ICD-10-CM | POA: Insufficient documentation

## 2013-12-20 DIAGNOSIS — Z86718 Personal history of other venous thrombosis and embolism: Secondary | ICD-10-CM | POA: Insufficient documentation

## 2013-12-20 DIAGNOSIS — C8319 Mantle cell lymphoma, extranodal and solid organ sites: Secondary | ICD-10-CM | POA: Insufficient documentation

## 2013-12-20 DIAGNOSIS — Z87891 Personal history of nicotine dependence: Secondary | ICD-10-CM | POA: Insufficient documentation

## 2013-12-20 DIAGNOSIS — S98139A Complete traumatic amputation of one unspecified lesser toe, initial encounter: Secondary | ICD-10-CM | POA: Insufficient documentation

## 2013-12-20 DIAGNOSIS — M869 Osteomyelitis, unspecified: Secondary | ICD-10-CM

## 2013-12-20 DIAGNOSIS — I10 Essential (primary) hypertension: Secondary | ICD-10-CM | POA: Insufficient documentation

## 2013-12-20 DIAGNOSIS — L97409 Non-pressure chronic ulcer of unspecified heel and midfoot with unspecified severity: Secondary | ICD-10-CM | POA: Insufficient documentation

## 2013-12-20 DIAGNOSIS — I359 Nonrheumatic aortic valve disorder, unspecified: Secondary | ICD-10-CM | POA: Insufficient documentation

## 2013-12-20 DIAGNOSIS — M1990 Unspecified osteoarthritis, unspecified site: Secondary | ICD-10-CM | POA: Insufficient documentation

## 2013-12-20 DIAGNOSIS — D649 Anemia, unspecified: Secondary | ICD-10-CM | POA: Insufficient documentation

## 2013-12-20 DIAGNOSIS — E1169 Type 2 diabetes mellitus with other specified complication: Principal | ICD-10-CM | POA: Insufficient documentation

## 2013-12-20 DIAGNOSIS — Z01812 Encounter for preprocedural laboratory examination: Secondary | ICD-10-CM | POA: Insufficient documentation

## 2013-12-20 DIAGNOSIS — Z01810 Encounter for preprocedural cardiovascular examination: Secondary | ICD-10-CM | POA: Insufficient documentation

## 2013-12-20 DIAGNOSIS — I2510 Atherosclerotic heart disease of native coronary artery without angina pectoris: Secondary | ICD-10-CM | POA: Insufficient documentation

## 2013-12-20 DIAGNOSIS — I252 Old myocardial infarction: Secondary | ICD-10-CM | POA: Insufficient documentation

## 2013-12-20 DIAGNOSIS — M898X9 Other specified disorders of bone, unspecified site: Secondary | ICD-10-CM | POA: Insufficient documentation

## 2013-12-20 HISTORY — PX: ACHILLES TENDON LENGTHENING: SHX6455

## 2013-12-20 HISTORY — PX: AMPUTATION: SHX166

## 2013-12-20 LAB — GLUCOSE, CAPILLARY
GLUCOSE-CAPILLARY: 129 mg/dL — AB (ref 70–99)
Glucose-Capillary: 114 mg/dL — ABNORMAL HIGH (ref 70–99)
Glucose-Capillary: 107 mg/dL — ABNORMAL HIGH (ref 70–99)
Glucose-Capillary: 123 mg/dL — ABNORMAL HIGH (ref 70–99)

## 2013-12-20 SURGERY — AMPUTATION, FOOT, RAY
Anesthesia: General | Site: Foot | Laterality: Left

## 2013-12-20 MED ORDER — HYDROXYZINE HCL 25 MG PO TABS
25.0000 mg | ORAL_TABLET | Freq: Two times a day (BID) | ORAL | Status: DC
  Administered 2013-12-21: 25 mg via ORAL
  Filled 2013-12-20 (×2): qty 1

## 2013-12-20 MED ORDER — VITAMIN B-12 500 MCG PO TABS: 1000.0000 ug | ORAL_TABLET | Freq: Every day | ORAL | Status: DC

## 2013-12-20 MED ORDER — SIMVASTATIN 20 MG PO TABS
20.0000 mg | ORAL_TABLET | Freq: Every day | ORAL | Status: DC
  Administered 2013-12-20: 20 mg via ORAL
  Filled 2013-12-20 (×2): qty 1

## 2013-12-20 MED ORDER — ONDANSETRON HCL 4 MG/2ML IJ SOLN
INTRAMUSCULAR | Status: DC | PRN
  Administered 2013-12-20: 4 mg via INTRAVENOUS

## 2013-12-20 MED ORDER — LIDOCAINE HCL (CARDIAC) 20 MG/ML IV SOLN
INTRAVENOUS | Status: DC | PRN
  Administered 2013-12-20: 70 mg via INTRAVENOUS

## 2013-12-20 MED ORDER — FENTANYL CITRATE 0.05 MG/ML IJ SOLN
INTRAMUSCULAR | Status: DC | PRN
  Administered 2013-12-20: 100 ug via INTRAVENOUS

## 2013-12-20 MED ORDER — PROPOFOL 10 MG/ML IV BOLUS
INTRAVENOUS | Status: DC | PRN
  Administered 2013-12-20: 120 mg via INTRAVENOUS

## 2013-12-20 MED ORDER — HYDROCODONE-ACETAMINOPHEN 5-325 MG PO TABS
ORAL_TABLET | ORAL | Status: AC
  Filled 2013-12-20: qty 2

## 2013-12-20 MED ORDER — PROMETHAZINE HCL 25 MG/ML IJ SOLN: 6.2500 mg | INTRAMUSCULAR | Status: DC | PRN

## 2013-12-20 MED ORDER — CEFAZOLIN SODIUM-DEXTROSE 2-3 GM-% IV SOLR
2.0000 g | Freq: Four times a day (QID) | INTRAVENOUS | Status: AC
  Administered 2013-12-20 – 2013-12-21 (×3): 2 g via INTRAVENOUS
  Filled 2013-12-20 (×4): qty 50

## 2013-12-20 MED ORDER — FENTANYL CITRATE 0.05 MG/ML IJ SOLN
INTRAMUSCULAR | Status: AC
  Filled 2013-12-20: qty 2

## 2013-12-20 MED ORDER — CHLORHEXIDINE GLUCONATE 4 % EX LIQD: 60.0000 mL | Freq: Once | CUTANEOUS | Status: DC

## 2013-12-20 MED ORDER — METOPROLOL TARTRATE 12.5 MG HALF TABLET
12.5000 mg | ORAL_TABLET | Freq: Two times a day (BID) | ORAL | Status: DC
  Administered 2013-12-20 – 2013-12-21 (×2): 12.5 mg via ORAL
  Filled 2013-12-20 (×3): qty 1

## 2013-12-20 MED ORDER — GABAPENTIN 300 MG PO CAPS: 300.0000 mg | ORAL_CAPSULE | Freq: Three times a day (TID) | ORAL | Status: DC

## 2013-12-20 MED ORDER — VITAMIN B-12 1000 MCG PO TABS
1000.0000 ug | ORAL_TABLET | Freq: Every day | ORAL | Status: DC
  Administered 2013-12-21: 1000 ug via ORAL
  Filled 2013-12-20: qty 1

## 2013-12-20 MED ORDER — ISOSORBIDE MONONITRATE ER 30 MG PO TB24
30.0000 mg | ORAL_TABLET | Freq: Every day | ORAL | Status: DC
  Administered 2013-12-21: 30 mg via ORAL
  Filled 2013-12-20: qty 1

## 2013-12-20 MED ORDER — HYDROCODONE-ACETAMINOPHEN 5-325 MG PO TABS
1.0000 | ORAL_TABLET | ORAL | Status: DC | PRN
  Administered 2013-12-20 – 2013-12-21 (×5): 1 via ORAL
  Filled 2013-12-20 (×4): qty 1

## 2013-12-20 MED ORDER — SODIUM CHLORIDE 0.9 % IV SOLN: INTRAVENOUS | Status: DC

## 2013-12-20 MED ORDER — ONDANSETRON HCL 4 MG PO TABS: 4.0000 mg | ORAL_TABLET | Freq: Four times a day (QID) | ORAL | Status: DC | PRN

## 2013-12-20 MED ORDER — GABAPENTIN 300 MG PO CAPS
600.0000 mg | ORAL_CAPSULE | ORAL | Status: DC
  Administered 2013-12-21: 600 mg via ORAL
  Filled 2013-12-20 (×3): qty 2

## 2013-12-20 MED ORDER — OXYCODONE HCL 5 MG/5ML PO SOLN: 5.0000 mg | Freq: Once | ORAL | Status: AC | PRN

## 2013-12-20 MED ORDER — ASPIRIN EC 81 MG PO TBEC
81.0000 mg | DELAYED_RELEASE_TABLET | Freq: Every day | ORAL | Status: DC
  Administered 2013-12-20 – 2013-12-21 (×2): 81 mg via ORAL
  Filled 2013-12-20 (×2): qty 1

## 2013-12-20 MED ORDER — MIDAZOLAM HCL 2 MG/2ML IJ SOLN
INTRAMUSCULAR | Status: AC
  Filled 2013-12-20: qty 2

## 2013-12-20 MED ORDER — MIDAZOLAM HCL 5 MG/5ML IJ SOLN
INTRAMUSCULAR | Status: DC | PRN
  Administered 2013-12-20: 1 mg via INTRAVENOUS

## 2013-12-20 MED ORDER — FENTANYL CITRATE 0.05 MG/ML IJ SOLN: 50.0000 ug | INTRAMUSCULAR | Status: DC | PRN

## 2013-12-20 MED ORDER — GABAPENTIN 300 MG PO CAPS
300.0000 mg | ORAL_CAPSULE | Freq: Every day | ORAL | Status: DC
  Administered 2013-12-20: 300 mg via ORAL
  Filled 2013-12-20 (×2): qty 1

## 2013-12-20 MED ORDER — OXYCODONE HCL 5 MG PO TABS
ORAL_TABLET | ORAL | Status: AC
  Filled 2013-12-20: qty 1

## 2013-12-20 MED ORDER — MIDAZOLAM HCL 2 MG/2ML IJ SOLN: 1.0000 mg | INTRAMUSCULAR | Status: DC | PRN

## 2013-12-20 MED ORDER — METFORMIN HCL 500 MG PO TABS
500.0000 mg | ORAL_TABLET | Freq: Two times a day (BID) | ORAL | Status: DC
  Filled 2013-12-20 (×2): qty 1

## 2013-12-20 MED ORDER — LACTATED RINGERS IV SOLN
INTRAVENOUS | Status: DC
  Administered 2013-12-20: 13:00:00 via INTRAVENOUS

## 2013-12-20 MED ORDER — FENTANYL CITRATE 0.05 MG/ML IJ SOLN
25.0000 ug | INTRAMUSCULAR | Status: DC | PRN
  Administered 2013-12-20: 50 ug via INTRAVENOUS
  Administered 2013-12-20: 25 ug via INTRAVENOUS

## 2013-12-20 MED ORDER — OXYCODONE HCL 5 MG PO TABS
5.0000 mg | ORAL_TABLET | Freq: Once | ORAL | Status: AC | PRN
  Administered 2013-12-20: 5 mg via ORAL

## 2013-12-20 MED ORDER — ONDANSETRON HCL 4 MG/2ML IJ SOLN: 4.0000 mg | Freq: Four times a day (QID) | INTRAMUSCULAR | Status: DC | PRN

## 2013-12-20 SURGICAL SUPPLY — 45 items
BLADE LONG MED 31MMX9MM (MISCELLANEOUS) ×1
BLADE LONG MED 31X9 (MISCELLANEOUS) ×2 IMPLANT
BNDG COHESIVE 4X5 TAN STRL (GAUZE/BANDAGES/DRESSINGS) ×3 IMPLANT
BNDG COHESIVE 6X5 TAN STRL LF (GAUZE/BANDAGES/DRESSINGS) ×3 IMPLANT
BNDG ESMARK 4X9 LF (GAUZE/BANDAGES/DRESSINGS) ×3 IMPLANT
CHLORAPREP W/TINT 26ML (MISCELLANEOUS) ×3 IMPLANT
CLOTH BEACON ORANGE TIMEOUT ST (SAFETY) ×3 IMPLANT
CUFF TOURNIQUET SINGLE 34IN LL (TOURNIQUET CUFF) ×3 IMPLANT
CUFF TOURNIQUET SINGLE 44IN (TOURNIQUET CUFF) IMPLANT
DRAPE U-SHAPE 47X51 STRL (DRAPES) ×6 IMPLANT
DRSG ADAPTIC 3X8 NADH LF (GAUZE/BANDAGES/DRESSINGS) ×3 IMPLANT
DRSG MEPITEL 3X4 ME34 (GAUZE/BANDAGES/DRESSINGS) ×3 IMPLANT
DRSG PAD ABDOMINAL 8X10 ST (GAUZE/BANDAGES/DRESSINGS) ×3 IMPLANT
ELECT REM PT RETURN 9FT ADLT (ELECTROSURGICAL) ×3
ELECTRODE REM PT RTRN 9FT ADLT (ELECTROSURGICAL) ×1 IMPLANT
GLOVE BIO SURGEON STRL SZ7 (GLOVE) ×3 IMPLANT
GLOVE BIO SURGEON STRL SZ8 (GLOVE) ×6 IMPLANT
GLOVE BIOGEL PI IND STRL 7.5 (GLOVE) ×1 IMPLANT
GLOVE BIOGEL PI IND STRL 8 (GLOVE) ×1 IMPLANT
GLOVE BIOGEL PI INDICATOR 7.5 (GLOVE) ×2
GLOVE BIOGEL PI INDICATOR 8 (GLOVE) ×2
GOWN PREVENTION PLUS XLARGE (GOWN DISPOSABLE) ×3 IMPLANT
GOWN STRL NON-REIN LRG LVL3 (GOWN DISPOSABLE) ×3 IMPLANT
KIT BASIN OR (CUSTOM PROCEDURE TRAY) ×3 IMPLANT
KIT ROOM TURNOVER OR (KITS) ×3 IMPLANT
MANIFOLD NEPTUNE II (INSTRUMENTS) ×3 IMPLANT
NS IRRIG 1000ML POUR BTL (IV SOLUTION) ×3 IMPLANT
PACK ORTHO EXTREMITY (CUSTOM PROCEDURE TRAY) ×3 IMPLANT
PAD ARMBOARD 7.5X6 YLW CONV (MISCELLANEOUS) ×6 IMPLANT
PAD CAST 4YDX4 CTTN HI CHSV (CAST SUPPLIES) ×1 IMPLANT
PADDING CAST COTTON 4X4 STRL (CAST SUPPLIES) ×2
SPONGE GAUZE 4X4 12PLY (GAUZE/BANDAGES/DRESSINGS) ×3 IMPLANT
SPONGE GAUZE 4X4 12PLY STER LF (GAUZE/BANDAGES/DRESSINGS) ×3 IMPLANT
SPONGE LAP 18X18 X RAY DECT (DISPOSABLE) ×3 IMPLANT
STAPLER VISISTAT 35W (STAPLE) IMPLANT
STOCKINETTE IMPERVIOUS LG (DRAPES) IMPLANT
SUCTION FRAZIER TIP 10 FR DISP (SUCTIONS) ×3 IMPLANT
SUT ETHILON 2 0 FS 18 (SUTURE) ×3 IMPLANT
SUT ETHILON 2 0 PSLX (SUTURE) ×3 IMPLANT
TOWEL OR 17X24 6PK STRL BLUE (TOWEL DISPOSABLE) ×3 IMPLANT
TOWEL OR 17X26 10 PK STRL BLUE (TOWEL DISPOSABLE) ×3 IMPLANT
TUBE CONNECTING 12'X1/4 (SUCTIONS) ×1
TUBE CONNECTING 12X1/4 (SUCTIONS) ×2 IMPLANT
UNDERPAD 30X30 INCONTINENT (UNDERPADS AND DIAPERS) ×3 IMPLANT
WATER STERILE IRR 1000ML POUR (IV SOLUTION) ×3 IMPLANT

## 2013-12-20 NOTE — Anesthesia Postprocedure Evaluation (Signed)
  Anesthesia Post-op Note  Patient: [name]  Procedure(s) Performed: Procedure(s): AMPUTATION LEFT 5TH TRANSMETATARSAL (Left) ACHILLES TENDON LENGTHENING (Left)  Patient Location: PACU  Anesthesia Type:General  Level of Consciousness: awake and alert   Airway and Oxygen Therapy: Patient Spontanous Breathing  Post-op Pain: none  Post-op Assessment: Post-op Vital signs reviewed  Post-op Vital Signs: stable  Complications: No apparent anesthesia complications

## 2013-12-20 NOTE — Anesthesia Procedure Notes (Signed)
Procedure Name: LMA Insertion Date/Time: 12/20/2013 2:52 PM Performed by: Melina Copa, Neaveh Belanger R Pre-anesthesia Checklist: Patient identified, Emergency Drugs available, Suction available, Patient being monitored and Timeout performed Patient Re-evaluated:Patient Re-evaluated prior to inductionOxygen Delivery Method: Circle system utilized Preoxygenation: Pre-oxygenation with 100% oxygen Intubation Type: IV induction Ventilation: Mask ventilation without difficulty LMA Size: 5.0 Number of attempts: 1 Placement Confirmation: positive ETCO2 and breath sounds checked- equal and bilateral Tube secured with: Tape Dental Injury: Teeth and Oropharynx as per pre-operative assessment

## 2013-12-20 NOTE — Transfer of Care (Signed)
Immediate Anesthesia Transfer of Care Note  Patient: [name]  Procedure(s) Performed: Procedure(s): AMPUTATION LEFT 5TH TRANSMETATARSAL (Left) ACHILLES TENDON LENGTHENING (Left)  Patient Location: PACU  Anesthesia Type:General  Level of Consciousness: awake  Airway & Oxygen Therapy: Patient Spontanous Breathing and Patient connected to nasal cannula oxygen  Post-op Assessment: Report given to PACU RN and Post -op Vital signs reviewed and stable  Post vital signs: Reviewed and stable  Complications: No apparent anesthesia complications

## 2013-12-20 NOTE — Preoperative (Signed)
Beta Blockers   Reason not to administer Beta Blockers:metoprolol taken at 0730 hrs on 12/20/2013

## 2013-12-20 NOTE — Consult Note (Signed)
[facility] for Infectious Disease  Total days of antibiotics 1        Day 1 cefazolin               Reason for Consult: 5th toe osteo s/p TM amputation of left foot    Referring Physician: hewitt  Active Problems:   Foot osteomyelitis, left    HPI: [name] is a 80 y.o. male  with a history of mantle cell lymphoma, coronary artery disease, type 2 diabetes mellitus, and recent left foot ulceration who had received chemo with rituximab in December. His chemo at that time was delayed due to  potassium of 7.2, where he was admitted for hydration and kayexalate.  He is also being followed by dr. Doran Durand for ongoing osteomyelitis of his left foot. Previously had 1st and 2nd toe amputation, but most recently noted to have 5th toe osteo.mr. Blazier underwent left TM amputation and left percutaneous tendo-achilles lengthening on 12/20/13. Dr. Doran Durand sent specimen for culture but reports that the proximal area of foot looked good, sans infection. Patient had previously been on keflex prior to surgery. Patient does have plantar ulcer on the left foot but felt not to be infected. ID consulted to see what options needed to treat for osteomyelitis. The patient reports that he is doing ok. Feels cold in the room under many blankets, but he reports " i run cold". Patient has history of MSSA abscess to his left foot in the past as noted in micro results from march 2014.   Past Medical History  Diagnosis Date  . CAD (coronary artery disease)     a. s/p CABG 1995;  b. NSTEMI & subsequent BMS to SVG->right PDA 02/03/12.;  c. Cath 02/14/2012 3vd with 4/4 patent grafts and patent stent in vg->pda.  . Carotid stenosis     a. Carotid dopplers 2/35: RICA 57-32%, LICA 2-02%;  b. dopplers 3/14:  54-27% RICA, 0-62% LICA  . HTN (hypertension)   . HLD (hyperlipidemia)   . DM2 (diabetes mellitus, type 2)   . DJD (degenerative joint disease)   . Asthmatic bronchitis   . BPH (benign prostatic hypertrophy)   .  Herpes zoster   . Anemia   . Thrombocytopenia     a. secondary to splenomegaly related to lymphoma with suspicion of bone marrow involvement. (Plavix had to be stopped due to this)  . Aortic stenosis, moderate     a. Echo 02/14/12 EF 55%, Gr 2 DD, Mod AS (mean grad: 29, peak grad 52)  . Splenomegaly 04/13/2012  . History of blood transfusion 04/13/2012  . 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 Dermatitis 06/07/2012    acnieform rash on back 06/06/12  . COPD (chronic obstructive pulmonary disease)   . Peripheral vascular disease     a. s/p L ray amp 10/13;  b. s/p L 2nd toe amp 12/13  . Pulmonary nodule     70mm RUL calcified pulm nodule noted on CT staging for lymphoma - stable 10/2012.  . Leg DVT (deep venous thromboembolism), chronic     LE dopplers 5/13, 10/13 and 1/14: chronic DVT involving right mid femoral vein, left mid femoral vein, and left popliteal vein.  . Osteomyelitis     L first foot ray amputation 09/2012  . Gangrene     L 2nd toe amputation 11/2012    Allergies:  Allergies  Allergen Reactions  .  Losartan     Hyperkalemia > 7  . Azithromycin Itching  . Doxycycline Itching       . Macrodantin Itching  . Minocycline Hcl Itching  . Nitrofurantoin Itching  . Zithromax [Azithromycin Dihydrate] Itching  . Contrast Media [Iodinated Diagnostic Agents] Rash     MEDICATIONS: . aspirin EC  81 mg Oral Daily  .  ceFAZolin (ANCEF) IV  2 g Intravenous Q6H  . fentaNYL      . gabapentin  300 mg Oral QHS  . [START ON 12/21/2013] gabapentin  600 mg Oral Custom  . HYDROcodone-acetaminophen      . hydrOXYzine  25 mg Oral BID PC  . [START ON 12/21/2013] isosorbide mononitrate  30 mg Oral Daily  . [START ON 12/21/2013] metFORMIN  500 mg Oral BID WC  . metoprolol tartrate  12.5 mg Oral BID  . oxyCODONE      . simvastatin  20 mg Oral q1800  . [START ON 12/21/2013] vitamin B-12  1,000 mcg Oral Daily    History    Substance Use Topics  . Smoking status: Former Smoker -- 1.00 packs/day for 30 years    Types: Cigarettes, Pipe    Quit date: 03/29/1985  . Smokeless tobacco: Never Used     Comment: quit in 1986  . Alcohol Use: No    Family History  Problem Relation Age of Onset  . Coronary artery disease    . Alcohol abuse      Review of Systems  Constitutional: positive for chills.egative for fever,  diaphoresis, activity change, appetite change, fatigue and unexpected weight change.  HENT: Negative for congestion, sore throat, rhinorrhea, sneezing, trouble swallowing and sinus pressure.  Eyes: Negative for photophobia and visual disturbance.  Respiratory: Negative for cough, chest tightness, shortness of breath, wheezing and stridor.  Cardiovascular: Negative for chest pain, palpitations and leg swelling.  Gastrointestinal: Negative for nausea, vomiting, abdominal pain, diarrhea, constipation, blood in stool, abdominal distention and anal bleeding.  Genitourinary: Negative for dysuria, hematuria, flank pain and difficulty urinating.  Musculoskeletal: Negative for myalgias, back pain, joint swelling, arthralgias and gait problem.  Skin: Negative for color change, pallor, rash and wound.  Neurological: Negative for dizziness, tremors, weakness and light-headedness.  Hematological: Negative for adenopathy. Does not bruise/bleed easily.  Psychiatric/Behavioral: Negative for behavioral problems, confusion, sleep disturbance, dysphoric mood, decreased concentration and agitation.     OBJECTIVE: Temp:  [97.4 F (36.3 C)-97.8 F (36.6 C)] 97.4 F (36.3 C) (01/15 1700) Pulse Rate:  [57-78] 78 (01/15 1700) Resp:  [8-20] 16 (01/15 1700) BP: (157-205)/(75-128) 164/128 mmHg (01/15 1700) SpO2:  [93 %-100 %] 96 % (01/15 1700)  Constitutional: He is oriented to person, place, and time. He appears well-developed and well-nourished. No distress.  HENT:  Mouth/Throat: Oropharynx is clear and moist. No  oropharyngeal exudate.  Cardiovascular: brachycardia, regular rhythm and 3/6 systolic murmur BH RUSB and apex. Exam reveals no gallop and no friction rub.  No murmur heard.  Pulmonary/Chest: Effort normal and breath sounds normal. No respiratory distress. He has no wheezes.  Abdominal: Soft. Bowel sounds are normal. He exhibits no distension. There is no tenderness.  Lymphadenopathy:  He has no cervical adenopathy.  Neurological: He is alert and oriented to person, place, and time.  Skin: Skin is warm and dry. No rash noted. No erythema.  Ext: left foot is wrapped   LABS: Results for orders placed during the hospital encounter of 12/20/13 (from the past 48 hour(s))  GLUCOSE, CAPILLARY  Status: Abnormal   Collection Time    12/20/13  1:04 PM      Result Value Range   Glucose-Capillary 114 (*) 70 - 99 mg/dL  GLUCOSE, CAPILLARY     Status: Abnormal   Collection Time    12/20/13  3:51 PM      Result Value Range   Glucose-Capillary 107 (*) 70 - 99 mg/dL   Comment 1 Notify RN    GLUCOSE, CAPILLARY     Status: Abnormal   Collection Time    12/20/13  5:06 PM      Result Value Range   Glucose-Capillary 123 (*) 70 - 99 mg/dL    MICRO: 12/19 or culture pending  Assessment/Plan:  80yo M with mantle cell lymphoma, CAD, DM2,  and prior hx of MSSA deep tissue infection found to have 5th toe osteo POD#0 from TM amputation of left foot  - will check sed rate and crp - appears to have good margins per OR report, but not confirmed by path. Recommend to discharge on 2 wks of keflex 500mg  TID, as "mop up". Follow up as an outpatient to decide if need to extend further   - will get patient into ID clinic for follow up for evaluation Namiyah Grantham B. [place] [facility] [phone]

## 2013-12-20 NOTE — Progress Notes (Signed)
Orthopedic Tech Progress Note Patient Details:  [name] 11-Sep-1927 [id number] Called [facility] for brace. Patient ID: IZEN PETZ, male   DOB: Aug 15, 1927, 80 y.o.   MRN: [id number]   Braulio Bosch 12/20/2013, 5:34 PM

## 2013-12-20 NOTE — Progress Notes (Signed)
Orthopedic Tech Progress Note Patient Details:  THONG FEENY [birth year] [id number] Brace order completed by [facility] Patient ID: Denman George, male   DOB: [birth year], 80 y.o.   MRN: [id number]   Braulio Bosch 12/20/2013, 9:13 PM

## 2013-12-20 NOTE — Telephone Encounter (Signed)
Received message from pt's wife stating "wanted Dr. Beryle Beams to be aware that Adel is going to have foot surgery today @ [facility]; they are going to remove 3 toes, I think"  Note to Dr. Beryle Beams.

## 2013-12-20 NOTE — Brief Op Note (Signed)
12/20/2013  3:50 PM  PATIENT:  [name]  80 y.o. male  PRE-OPERATIVE DIAGNOSIS:  LEFT 5TH TOE OSTEOMYOLITIS  POST-OPERATIVE DIAGNOSIS:  same  Procedure(s): 1.  Left percutaneous tendo-achilles lengthening 2.  Left transmetatarsal amputation  SURGEON:  Wylene Simmer, MD  ASSISTANT: n/a  ANESTHESIA:   General, regional  EBL:  minimal   TOURNIQUET:   Total Tourniquet Time Documented: Thigh (Left) - 26 minutes Total: Thigh (Left) - 26 minutes   COMPLICATIONS:  None apparent  DISPOSITION:  Extubated, awake and stable to recovery.  Specimen:  Deep tissue to micro for cultures  DICTATION ID:  [id number]

## 2013-12-20 NOTE — Discharge Instructions (Signed)
Wylene Simmer, MD [place]  Please read the following information regarding your care after surgery.  Medications  You only need a prescription for the narcotic pain medicine (ex. oxycodone, Percocet, Norco).  All of the other medicines listed below are available over the counter. ? acetominophen (Tylenol) 650 mg every 4-6 hours as you need for minor pain ? oxycodone as prescribed for moderate to severe pain ?   Narcotic pain medicine (ex. oxycodone, Percocet, Vicodin) will cause constipation.  To prevent this problem, take the following medicines while you are taking any pain medicine. ? docusate sodium (Colace) 100 mg twice a day ? senna (Senokot) 2 tablets twice a day  ? To help prevent blood clots, take an aspirin (325 mg) once a day for a month after surgery.  You should also get up every hour while you are awake to move around.    Weight Bearing ? Bear weight when you are able on your operated leg or foot. ? Bear weight only on the heel of your operated foot in the post-op shoe. ? Do not bear any weight on the operated leg or foot.  Cast / Splint / Dressing ? Keep your splint or cast clean and dry.  Dont put anything (coat hanger, pencil, etc) down inside of it.  If it gets damp, use a hair dryer on the cool setting to dry it.  If it gets soaked, call the office to schedule an appointment for a cast change. ? Remove your dressing 3 days after surgery and cover the incisions with dry dressings.    After your dressing, cast or splint is removed; you may shower, but do not soak or scrub the wound.  Allow the water to run over it, and then gently pat it dry.  Swelling It is normal for you to have swelling where you had surgery.  To reduce swelling and pain, keep your toes above your nose for at least 3 days after surgery.  It may be necessary to keep your foot or leg elevated for several weeks.  If it hurts, it should be elevated.  Follow Up Call my office at  [phone] when you are discharged from the hospital or surgery center to schedule an appointment to be seen two weeks after surgery.  Call my office at [phone] if you develop a fever >101.5 F, nausea, vomiting, bleeding from the surgical site or severe pain.

## 2013-12-20 NOTE — Anesthesia Preprocedure Evaluation (Addendum)
Anesthesia Evaluation  Patient identified by MRN, date of birth, ID band Patient awake    Reviewed: Allergy & Precautions, H&P , NPO status , Patient's Chart, lab work & pertinent test results, reviewed documented beta blocker date and time   Airway Mallampati: II TM Distance: >3 FB Neck ROM: Full    Dental  (+) Teeth Intact, Partial Lower and Dental Advisory Given   Pulmonary asthma , COPDformer smoker,  + rhonchi (Faint rhonchi right lung otherwise clear; SaO2 98% on room air)         Cardiovascular hypertension, Pt. on home beta blockers and Pt. on medications + CAD, + Past MI, + Cardiac Stents, + CABG and + Peripheral Vascular Disease + Valvular Problems/Murmurs AS Rhythm:Regular Rate:Normal + Systolic murmurs    Neuro/Psych  Neuromuscular disease negative neurological ROS  negative psych ROS   GI/Hepatic negative GI ROS, Neg liver ROS,   Endo/Other  diabetes, Type 2  Renal/GU      Musculoskeletal negative musculoskeletal ROS (+)   Abdominal   Peds  Hematology  (+) Blood dyscrasia, anemia , Hx Lymphoma; thrombocytopenia   Anesthesia Other Findings   Reproductive/Obstetrics negative OB ROS                        Anesthesia Physical Anesthesia Plan  ASA: III  Anesthesia Plan: General   Post-op Pain Management:    Induction: Intravenous  Airway Management Planned: LMA  Additional Equipment:   Intra-op Plan:   Post-operative Plan: Extubation in OR  Informed Consent: I have reviewed the patients History and Physical, chart, labs and discussed the procedure including the risks, benefits and alternatives for the proposed anesthesia with the patient or authorized representative who has indicated his/her understanding and acceptance.   Dental advisory given  Plan Discussed with: CRNA, Surgeon and Anesthesiologist  Anesthesia Plan Comments:       Anesthesia Quick Evaluation

## 2013-12-21 ENCOUNTER — Encounter (HOSPITAL_COMMUNITY): Payer: Self-pay | Admitting: Orthopedic Surgery

## 2013-12-21 LAB — BASIC METABOLIC PANEL
BUN: 18 mg/dL (ref 6–23)
CO2: 25 meq/L (ref 19–32)
Calcium: 9.3 mg/dL (ref 8.4–10.5)
Chloride: 105 mEq/L (ref 96–112)
Creatinine, Ser: 0.91 mg/dL (ref 0.50–1.35)
GFR calc Af Amer: 88 mL/min — ABNORMAL LOW (ref >90)
GFR calc non Af Amer: 76 mL/min — ABNORMAL LOW (ref >90)
GLUCOSE: 137 mg/dL — AB (ref 70–99)
POTASSIUM: 4.3 meq/L (ref 3.7–5.3)
Sodium: 142 mEq/L (ref 137–147)

## 2013-12-21 LAB — CBC WITH DIFFERENTIAL/PLATELET
Basophils Absolute: 0 10*3/uL (ref 0.0–0.1)
Basophils Relative: 0 % (ref 0–1)
EOS ABS: 0.1 10*3/uL (ref 0.0–0.7)
Eosinophils Relative: 3 % (ref 0–5)
HCT: 34.1 % — ABNORMAL LOW (ref 39.0–52.0)
HEMOGLOBIN: 11.3 g/dL — AB (ref 13.0–17.0)
LYMPHS ABS: 0.6 10*3/uL — AB (ref 0.7–4.0)
Lymphocytes Relative: 15 % (ref 12–46)
MCH: 33.2 pg (ref 26.0–34.0)
MCHC: 33.1 g/dL (ref 30.0–36.0)
MCV: 100.3 fL — ABNORMAL HIGH (ref 78.0–100.0)
MONOS PCT: 18 % — AB (ref 3–12)
Monocytes Absolute: 0.7 10*3/uL (ref 0.1–1.0)
Neutro Abs: 2.5 10*3/uL (ref 1.7–7.7)
Neutrophils Relative %: 64 % (ref 43–77)
Platelets: 87 10*3/uL — ABNORMAL LOW (ref 150–400)
RBC: 3.4 MIL/uL — AB (ref 4.22–5.81)
RDW: 14 % (ref 11.5–15.5)
WBC: 3.8 10*3/uL — AB (ref 4.0–10.5)

## 2013-12-21 LAB — GLUCOSE, CAPILLARY
Glucose-Capillary: 138 mg/dL — ABNORMAL HIGH (ref 70–99)
Glucose-Capillary: 144 mg/dL — ABNORMAL HIGH (ref 70–99)

## 2013-12-21 LAB — SEDIMENTATION RATE: Sed Rate: 8 mm/hr (ref 0–16)

## 2013-12-21 LAB — C-REACTIVE PROTEIN

## 2013-12-21 MED ORDER — CEPHALEXIN 500 MG PO CAPS: 500.0000 mg | ORAL_CAPSULE | Freq: Four times a day (QID) | ORAL | Status: DC

## 2013-12-21 MED ORDER — HYDROCODONE-ACETAMINOPHEN 5-325 MG PO TABS: 1.0000 | ORAL_TABLET | ORAL | Status: DC | PRN

## 2013-12-21 NOTE — Progress Notes (Signed)
Pt d/c home. Pt and wife verbalized understanding of d/c instructions and Rx. IV removed. Pt /dc via w/c in a stable conidition.

## 2013-12-21 NOTE — Care Management Note (Signed)
CARE MANAGEMENT NOTE 12/21/2013  Patient:  [name], [name]   Account Number:  [id number]  Date Initiated:  12/21/2013  Documentation initiated by:  Ricki Miller  Subjective/Objective Assessment:   80 yr old male s/p left transmetatarsal amputation.     Action/Plan:   Case Manager contacted patient concerning home health needs at discharge. Choice offered. CM  contacted [facility] with referral. Patient states he has rolling walker.   Anticipated DC Date:  12/21/2013   Anticipated DC Plan:  [place]  CM consult      [facility] Choice  HOME HEALTH   Choice offered to / List presented to:  C-1 Patient        [facility] arranged  [facility]      [place].   Status of service:  Completed, signed off Medicare Important Message given?   (If response is "NO", the following Medicare IM given date fields will be blank) Date Medicare IM given:   Date Additional Medicare IM given:    Discharge Disposition:  [place]

## 2013-12-21 NOTE — Progress Notes (Signed)
PT Evaluation   12/21/13 0912  PT Visit Information  Last PT Received On 12/21/13  Assistance Needed +1  History of Present Illness Pt is an 80 y/o male admitted s/p transmetatarsal amputation, achilles tendon l;engthening, and excision of medial cuneiform plantar exostosis.  Precautions  Precautions Fall  Required Braces or Orthoses Other Brace/Splint  Other Brace/Splint Boot  Restrictions  Weight Bearing Restrictions Yes  LLE Weight Bearing WBAT  Home Living  Family/patient expects to be discharged to: Private residence  Living Arrangements Spouse/significant other  Available Help at Discharge Family;Available 24 hours/day  Type of Home House  Home Access Stairs to enter  Entrance Stairs-Number of Steps 1  Entrance Stairs-Rails Left  Home Layout One level  [place] - single point;Walker - 2 wheels;BSC;Shower seat  Prior Function  Level of Independence Needs assistance  Gait / Transfers Assistance Needed Walker  ADL's / Homemaking Assistance Needed Wife assisted with dressing  Communication  Communication No difficulties  Cognition  Arousal/Alertness Awake/alert  Behavior During Therapy WFL for tasks assessed/performed  Overall Cognitive Status Within Functional Limits for tasks assessed  Upper Extremity Assessment  Upper Extremity Assessment Overall WFL for tasks assessed  Lower Extremity Assessment  Lower Extremity Assessment Overall WFL for tasks assessed;LLE deficits/detail  LLE Deficits / Details Decreased AROM and acute pain consistent with procedures performed.  LLE Unable to fully assess due to pain;Unable to fully assess due to immobilization  Cervical / Trunk Assessment  Cervical / Trunk Assessment Kyphotic  Bed Mobility  Overal bed mobility Modified Independent  General bed mobility comments Pt was able to come to full sit on EOB without physical assist. HOB was elevated, however anticipate that he would not need assist even with HOB flat.   Transfers  Overall transfer level Needs assistance  Equipment used Rolling walker (2 wheeled)  Transfers Sit to/from Stand  Sit to Stand Min guard  General transfer comment Pt was able to come to full stand with VC's for hand placement and safety awareness.   Ambulation/Gait  Ambulation/Gait assistance Min guard  Ambulation Distance (Feet) 50 Feet  Assistive device Rolling walker (2 wheeled)  Gait Pattern/deviations Step-to pattern;Step-through pattern;Decreased stride length;Trunk flexed  Gait velocity Decreased  Gait velocity interpretation Below normal speed for age/gender  General Gait Details VC's for sequencing and safety awareness with the RW. Pt reports getting fatigued easily as his pain inceases.   Stairs Yes  Stairs assistance Min guard  Stair Management Forwards;With walker  Number of Stairs 1  General stair comments VC's for sequencing, no physical assist required.   Balance  Overall balance assessment No apparent balance deficits (not formally assessed)  PT - End of Session  Equipment Utilized During Treatment Gait belt;Other (comment) (Boot)  Activity Tolerance Patient limited by pain  Patient left in chair;with call bell/phone within reach  Nurse Communication Mobility status  PT Assessment  PT Recommendation/Assessment Patient needs continued PT services  PT Problem List Decreased strength;Decreased range of motion;Decreased activity tolerance;Decreased balance;Decreased mobility;Decreased knowledge of use of DME;Decreased safety awareness;Decreased knowledge of precautions;Pain  PT Therapy Diagnosis  Difficulty walking;Acute pain  PT Plan  PT Frequency Min 5X/week  PT Treatment/Interventions Gait training;DME instruction;Stair training;Functional mobility training;Therapeutic activities;Therapeutic exercise;Neuromuscular re-education;Patient/family education  PT Recommendation  Follow Up Recommendations Home health PT  PT equipment None recommended by PT   Individuals Consulted  Consulted and Agree with Results and Recommendations Patient  Acute Rehab PT Goals  Patient Stated Goal To return home  PT Goal Formulation  With patient  Time For Goal Achievement 01/04/14  Potential to Achieve Goals Good  PT Time Calculation  PT Start Time 0910  PT Stop Time 0942  PT Time Calculation (min) 32 min  PT G-Codes **NOT FOR INPATIENT CLASS**  Functional Assessment Tool Used Clinical judgement  Functional Limitation Mobility: Walking and moving around  Mobility: Walking and Moving Around Current Status [phone]) CK  Mobility: Walking and Moving Around Goal Status (U0454) CK  PT General Charges  $$ ACUTE PT VISIT 1 Procedure  PT Evaluation  $Initial PT Evaluation Tier I 1 Procedure  PT Treatments  $Gait Training 8-22 mins  $Therapeutic Activity 8-22 mins  Written Expression  Dominant Hand Right  Assessment: This patient presents with acute pain and decreased functional independence following the above mentioned procedure. At the time of PT eval, pt was able to ambulate and negotiate 1 step with RW safely. This patient is appropriate for skilled PT interventions to address functional limitations, improve safety and independence with functional mobility, and return to PLOF.  Jolyn Lent, [place], DPT [phone]

## 2013-12-21 NOTE — Op Note (Signed)
[name], [name] NO.:  [id number]  MEDICAL RECORD NO.:  [id number]  LOCATION:  [id number]                        FACILITY:  [facility]  PHYSICIAN:  Wylene Simmer, MD        DATE OF BIRTH:  14-Apr-1927  DATE OF PROCEDURE:  12/20/2013 DATE OF DISCHARGE:                              OPERATIVE REPORT   PREOPERATIVE DIAGNOSES: 1. Left 5th toe osteomyelitis. 2. Left medial plantar diabetic ulcer.  POSTOPERATIVE DIAGNOSES: 1. Left 5th toe osteomyelitis. 2. Left medial plantar diabetic ulcer with underlying exostosis  PROCEDURE: 1. Left percutaneous tendo-Achilles lengthening. 2. Left transmetatarsal amputation. 3. Excision of medial cuneiform plantar exostosis  SURGEON:  Wylene Simmer, MD.  ANESTHESIA:  General, regional.  ESTIMATED BLOOD LOSS:  Minimal.  TOURNIQUET TIME:  26 minutes at 250 mmHg.  COMPLICATIONS:  None apparent.  DISPOSITION:  Extubated awake and stable to recovery.  SPECIMEN:  Deep tissue to Microbiology for anaerobic and aerobic culture.  INDICATIONS FOR PROCEDURE:  The patient is a 80 year old male with past medical history significant for lymphoma.  He is currently being treated by Dr. Fatima Sanger for tuna.  He has had recurrent diabetic ulcers of his left foot requiring previous amputation of his hallux and second toe.  He has a plantar medial ulcer as well as osteomyelitis of the fifth toe.  He is currently taking doxycycline.  He presents now for a transmetatarsal amputation, and tendo Achilles lengthening.  He understands the risks and benefits of the alternative treatment options and elects surgical treatment.  He specifically understands risks of bleeding, infection, nerve damage, blood clots, need for additional surgery, amputation, and death.  PROCEDURE IN DETAIL:  After preoperative consent was obtained, the correct operative site was identified, the patient was brought to the operating room and placed supine on the operating table.   General anesthesia was induced.  Preoperative antibiotics were administered. Surgical time-out was taken.  Left lower extremity was prepped and draped in standard sterile fashion and tourniquet around the thigh.  The extremity was exsanguinated.  Tourniquet was inflated to 250 mmHg.  A fishmouth incision was marked on the skin.  A percutaneous tendo- Achilles lengthening was then performed with 3 stab incisions into the heel cord.  The ankle was then noted to dorsiflex approximately 30 degrees with the knee extended.  Attention was then returned to the midfoot where the previously marked incision was made.  Sharp dissection was carried down through the skin subcutaneous tissue to the level of the bone.  The dorsal soft tissues were elevated subperiosteally exposing the metatarsal shafts.  Plantarly, the incision was taken down to the level of the bone and again subperiosteal dissection was carried down elevating the soft tissues off the plantar aspect of the metatarsals.  The oscillating saw was then used to cut through for the remaining metatarsals creating an appropriate plantar bevel and length of each metatarsal.  The forefoot was passed off the field as a specimen to Pathology.  In the area previous 1st metatarsal resection, deep tissue was acquired and sent as a specimen to Microbiology for aerobic and anaerobic culture.  The medial cuneiform was noted to be quite prominent plantarly in the  location of the previous plantar ulcer.  The oscillating saw was used to resect this bony prominence from the plantar aspect of the medial cuneiform.  The cut surfaces of bone were all smoothed with a rasp.  The wound was irrigated copiously.  The neurovascular bundles were cauterized.  The wound was closed with horizontal mattress sutures of 3-0 nylon.  Sterile dressings were applied followed by well-padded compression wrap.  Tourniquet was released after application of the dressings at 26  minutes.  The patient was awakened from anesthesia and transported to the recovery room in stable condition.  FOLLOWUP PLAN:  The patient will be weightbearing on his left foot in a CAM walker boot.  He will be admitted and started on IV vancomycin and Zosyn.  He will likely be in the hospital for 3-4 days awaiting final culture results.     Wylene Simmer, MD     JH/MEDQ  D:  12/20/2013  T:  12/21/2013  Job:  [id number]

## 2013-12-24 NOTE — Discharge Summary (Signed)
Physician Discharge Summary  Patient ID: [name] MRN: [id number] DOB/AGE: Mar 29, 1927 80 y.o.  Admit date: 12/20/2013 Discharge date: 12/24/2013  Admission Diagnoses: Left 5th toe Osteomyelitis   Discharge Diagnoses: Same  Active Problems:   Foot osteomyelitis, left   Discharged Condition: good  Hospital Course: Pt brought to the OR for left transmetatarsal amputation and excision of medial cuneiform plantar exostosis.  Pt tolerated procedure well and recovered in PACU and was transferred to the floor for further post operative care.  Consults: Infectious disease   Significant Diagnostic Studies: labs: C&S  Treatments: surgery: same as stated above  Discharge Exam: Blood pressure 138/55, pulse 54, temperature 98.5 F (36.9 C), temperature source Oral, resp. rate 20, SpO2 96.00%. WD WN male in NAD, A/Ox3, appears stated age, mood and affect normal.  EOMI, respirations unlabored.  Dressing C/D/I, in proper placement and good repair.   Disposition: 01-Home or Self Care  Discharge Orders   Future Appointments Provider Department Dept Phone   01/01/2014 4:15 PM Campbell Riches, MD [facility] for Infectious Disease [phone]   02/12/2014 8:45 AM [name], MD [place] Office [phone]   Future Orders Complete By Expires   Call MD / Call 911  As directed    Comments:     If you experience chest pain or shortness of breath, CALL 911 and be transported to the hospital emergency room.  If you develope a fever above 101 F, pus (white drainage) or increased drainage or redness at the wound, or calf pain, call your surgeon's office.   Constipation Prevention  As directed    Comments:     Drink plenty of fluids.  Prune juice may be helpful.  You may use a stool softener, such as Colace (over the counter) 100 mg twice a day.  Use MiraLax (over the counter) for constipation as needed.   Diet - low sodium heart healthy  As directed    Driving  restrictions  As directed    Comments:     No driving for 6 weeks   Increase activity slowly as tolerated  As directed    Lifting restrictions  As directed    Comments:     No lifting for 6 weeks       Medication List         aspirin EC 81 MG tablet  Take 81 mg by mouth every morning.     cephALEXin 500 MG capsule  Commonly known as:  KEFLEX  Take 1 capsule (500 mg total) by mouth 4 (four) times daily.     gabapentin 300 MG capsule  Commonly known as:  NEURONTIN  Take 300-600 mg by mouth 3 (three) times daily. Take 600 mg in the morning, 600 mg at midday, and 300 mg at bedtime.     HYDROcodone-acetaminophen 5-325 MG per tablet  Commonly known as:  NORCO/VICODIN  Take 1-2 tablets by mouth every 4 (four) hours as needed for moderate pain.     hydrOXYzine 25 MG tablet  Commonly known as:  ATARAX/VISTARIL  25 mg 2 (two) times daily after a meal.     isosorbide mononitrate 30 MG 24 hr tablet  Commonly known as:  IMDUR  Take 30 mg by mouth daily.     metFORMIN 500 MG tablet  Commonly known as:  GLUCOPHAGE  Take 500 mg by mouth 2 (two) times daily with a meal.     metoprolol tartrate 25 MG tablet  Commonly known  as:  LOPRESSOR  Take 12.5 mg by mouth 2 (two) times daily.     ondansetron 8 MG disintegrating tablet  Commonly known as:  ZOFRAN-ODT  Place 8 mg under the tongue every 8 (eight) hours as needed. For nausea.     pravastatin 40 MG tablet  Commonly known as:  PRAVACHOL  Take 40 mg by mouth daily.     vitamin B-12 500 MCG tablet  Commonly known as:  CYANOCOBALAMIN  Take 1,000 mcg by mouth daily.           Follow-up Information   Follow up with Wylene Simmer, MD.   Specialty:  Orthopedic Surgery   Contact information:   [address] [place] [address] [phone]       Signed: Trula Ore 12/24/2013, 3:05 PM

## 2013-12-25 LAB — TISSUE CULTURE

## 2013-12-26 LAB — ANAEROBIC CULTURE

## 2014-01-01 ENCOUNTER — Inpatient Hospital Stay: Payer: Medicare Other | Admitting: Infectious Diseases

## 2014-01-08 ENCOUNTER — Encounter: Payer: Self-pay | Admitting: Infectious Diseases

## 2014-01-08 ENCOUNTER — Ambulatory Visit (INDEPENDENT_AMBULATORY_CARE_PROVIDER_SITE_OTHER): Payer: Medicare Other | Admitting: Infectious Diseases

## 2014-01-08 VITALS — BP 122/67 | HR 59 | Temp 98.1°F | Ht 72.0 in | Wt 191.0 lb

## 2014-01-08 DIAGNOSIS — M869 Osteomyelitis, unspecified: Secondary | ICD-10-CM

## 2014-01-08 DIAGNOSIS — E1149 Type 2 diabetes mellitus with other diabetic neurological complication: Secondary | ICD-10-CM

## 2014-01-08 NOTE — Assessment & Plan Note (Signed)
Appears to be well controlled. He will f/u with his PCP.

## 2014-01-08 NOTE — Progress Notes (Signed)
   Subjective:    Patient ID: [name], male    DOB: 06/01/1927, 80 y.o.   MRN: [id number]  HPI 80 y.o. male with a history of mantle cell lymphoma (given rituximab in December), coronary artery disease, type 2 diabetes mellitus, and recent left foot ulceration. He is also being followed by dr. Doran Durand for ongoing osteomyelitis of his left foot. Previously had 1st and 2nd toe amputation, but most recently noted to have 5th toe osteo.mr. He underwent left TM amputation and left percutaneous tendo-achilles lengthening on 12/20/13. He was d/c home with 2 weeks of keflex, his surgical cx grew pan-sens enterococcus. His keflex was stopped on 01-01-14. Has been off anbx since.  Wound has been healing well. Has f/u with Dr Doran Durand 01-17-14.  No d/c from wound. No f/c. Has had mild ankle swelling.  FSG have been "pretty good": 100- 127.  Had ophtho exam last year.     Review of Systems     Objective:   Physical Exam  Constitutional: He appears well-developed and well-nourished.  HENT:  Mouth/Throat: No oropharyngeal exudate.  Eyes: EOM are normal. Pupils are equal, round, and reactive to light.  Neck: Neck supple.  Cardiovascular: Normal rate and regular rhythm.   Murmur heard. Pulmonary/Chest: Effort normal and breath sounds normal.  Abdominal: Soft. Bowel sounds are normal. There is no tenderness.  Musculoskeletal: He exhibits edema.       Feet:  Lymphadenopathy:    He has no cervical adenopathy.  Neurological:  Decreased light touch LLE.           Assessment & Plan:

## 2014-01-08 NOTE — Assessment & Plan Note (Addendum)
He is doing very well post surgery. His wound is healing well. My only concern is the mis-match between his anbx and his cx. I explained this to him and his family. I asked them to call me if he has any redness, pain, fever, chills, wound breakdown or d/c.  they would like to have his sutures out early, i will defer this to Dr Doran Durand.  He will otherwise rtc prn.

## 2014-01-24 ENCOUNTER — Other Ambulatory Visit: Payer: Self-pay | Admitting: Cardiology

## 2014-02-02 ENCOUNTER — Encounter: Payer: Self-pay | Admitting: Oncology

## 2014-02-07 ENCOUNTER — Encounter: Payer: Self-pay | Admitting: Cardiovascular Disease

## 2014-02-07 ENCOUNTER — Ambulatory Visit (HOSPITAL_COMMUNITY): Payer: Medicare Other | Attending: Cardiovascular Disease | Admitting: Radiology

## 2014-02-07 DIAGNOSIS — I359 Nonrheumatic aortic valve disorder, unspecified: Secondary | ICD-10-CM

## 2014-02-07 DIAGNOSIS — I350 Nonrheumatic aortic (valve) stenosis: Secondary | ICD-10-CM

## 2014-02-07 DIAGNOSIS — I2510 Atherosclerotic heart disease of native coronary artery without angina pectoris: Secondary | ICD-10-CM

## 2014-02-07 NOTE — Progress Notes (Signed)
Echocardiogram performed.  

## 2014-02-12 ENCOUNTER — Ambulatory Visit: Payer: Medicare Other | Admitting: Cardiovascular Disease

## 2014-02-28 ENCOUNTER — Other Ambulatory Visit (HOSPITAL_COMMUNITY): Payer: Self-pay | Admitting: Cardiology

## 2014-02-28 DIAGNOSIS — I6529 Occlusion and stenosis of unspecified carotid artery: Secondary | ICD-10-CM

## 2014-03-01 ENCOUNTER — Ambulatory Visit (HOSPITAL_COMMUNITY): Payer: Medicare Other | Attending: Cardiology | Admitting: Cardiology

## 2014-03-01 DIAGNOSIS — I6529 Occlusion and stenosis of unspecified carotid artery: Secondary | ICD-10-CM | POA: Insufficient documentation

## 2014-03-01 NOTE — Progress Notes (Signed)
Carotid duplex completed 

## 2014-03-05 ENCOUNTER — Encounter: Payer: Self-pay | Admitting: Cardiovascular Disease

## 2014-03-05 ENCOUNTER — Ambulatory Visit (INDEPENDENT_AMBULATORY_CARE_PROVIDER_SITE_OTHER): Payer: Medicare Other | Admitting: Cardiovascular Disease

## 2014-03-05 VITALS — BP 122/62 | HR 52 | Ht 72.0 in | Wt 188.0 lb

## 2014-03-05 DIAGNOSIS — I2589 Other forms of chronic ischemic heart disease: Secondary | ICD-10-CM

## 2014-03-05 DIAGNOSIS — I2510 Atherosclerotic heart disease of native coronary artery without angina pectoris: Secondary | ICD-10-CM

## 2014-03-05 DIAGNOSIS — I359 Nonrheumatic aortic valve disorder, unspecified: Secondary | ICD-10-CM

## 2014-03-05 DIAGNOSIS — I255 Ischemic cardiomyopathy: Secondary | ICD-10-CM

## 2014-03-05 DIAGNOSIS — I6529 Occlusion and stenosis of unspecified carotid artery: Secondary | ICD-10-CM

## 2014-03-05 DIAGNOSIS — I350 Nonrheumatic aortic (valve) stenosis: Secondary | ICD-10-CM

## 2014-03-05 NOTE — Patient Instructions (Signed)
Your physician has requested that you have an echocardiogram in 6 months before you see Dr. Burt Knack. Echocardiography is a painless test that uses sound waves to create images of your heart. It provides your doctor with information about the size and shape of your heart and how well your heart's chambers and valves are working. This procedure takes approximately one hour. There are no restrictions for this procedure.  Your physician wants you to follow-up in: 6 months with Dr. Burt Knack after your echocardiogram.  You will receive a reminder letter in the mail two months in advance. If you don't receive a letter, please call our office to schedule the follow-up appointment.  Your physician recommends that you continue on your current medications as directed. Please refer to the Current Medication list given to you today.

## 2014-03-05 NOTE — Progress Notes (Signed)
HPI:  80 year old gentleman presenting for followup evaluation. The patient has CAD status post CABG. He last underwent PCI in 2013 when he presented with a non-ST elevation MI. He had stenting of the saphenous vein graft to PDA with a bare-metal stent. The patient also had moderate aortic stenosis, type 2 diabetes, and peripheral arterial disease. He has multiple medical comorbidities which include Mantle cell lymphoma, chronic DVT, anemia, thrombocytopenia, and first ray amputation for osteomyelitis in 2013.  From a cardiac perspective the patient is doing fine. He remains modestly active and has no exertional symptoms. He denies chest pain, chest pressure, shortness of breath, edema, orthopnea, PND, palpitations, lightheadedness, or syncope. He continues on his same medical program without changes.  Outpatient Encounter Prescriptions as of 03/05/2014  Medication Sig  . aspirin EC 81 MG tablet Take 81 mg by mouth every morning.   . gabapentin (NEURONTIN) 300 MG capsule Take 300-600 mg by mouth 3 (three) times daily. Take 600 mg in the morning, 600 mg at midday, and 300 mg at bedtime.  . hydrOXYzine (ATARAX/VISTARIL) 25 MG tablet 25 mg 2 (two) times daily after a meal.   . isosorbide mononitrate (IMDUR) 30 MG 24 hr tablet TAKE ONE TABLET BY MOUTH ONCE DAILY  . metFORMIN (GLUCOPHAGE) 500 MG tablet Take 500 mg by mouth 2 (two) times daily with a meal.   . metoprolol tartrate (LOPRESSOR) 25 MG tablet Take 12.5 mg by mouth 2 (two) times daily.  . pravastatin (PRAVACHOL) 40 MG tablet Take 40 mg by mouth daily.  . vitamin B-12 (CYANOCOBALAMIN) 500 MCG tablet Take 1,000 mcg by mouth daily.   . [DISCONTINUED] ondansetron (ZOFRAN-ODT) 8 MG disintegrating tablet Place 8 mg under the tongue every 8 (eight) hours as needed. For nausea.    Allergies  Allergen Reactions  . Losartan     Hyperkalemia > 7  . Azithromycin Itching  . Doxycycline Itching       . Macrodantin Itching  . Minocycline Hcl  Itching  . Nitrofurantoin Itching  . Zithromax [Azithromycin Dihydrate] Itching  . Contrast Media [Iodinated Diagnostic Agents] Rash    Past Medical History  Diagnosis Date  . CAD (coronary artery disease)     a. s/p CABG 1995;  b. NSTEMI & subsequent BMS to SVG->right PDA 02/03/12.;  c. Cath 02/14/2012 3vd with 4/4 patent grafts and patent stent in vg->pda.  . Carotid stenosis     a. Carotid dopplers 8/34: RICA 19-62%, LICA 2-29%;  b. dopplers 3/14:  79-89% RICA, 2-11% LICA  . HTN (hypertension)   . HLD (hyperlipidemia)   . DM2 (diabetes mellitus, type 2)   . DJD (degenerative joint disease)   . Asthmatic bronchitis   . BPH (benign prostatic hypertrophy)   . Herpes zoster   . Anemia   . Thrombocytopenia     a. secondary to splenomegaly related to lymphoma with suspicion of bone marrow involvement. (Plavix had to be stopped due to this)  . Aortic stenosis, moderate     a. Echo 02/14/12 EF 55%, Gr 2 DD, Mod AS (mean grad: 29, peak grad 52)  . Splenomegaly 04/13/2012  . History of blood transfusion 04/13/2012  . 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 Dermatitis 06/07/2012    acnieform rash on back 06/06/12  . COPD (chronic obstructive pulmonary disease)   . Peripheral vascular disease     a. s/p L ray amp 10/13;  b. s/p  L 2nd toe amp 12/13  . Pulmonary nodule     22mm RUL calcified pulm nodule noted on CT staging for lymphoma - stable 10/2012.  . Leg DVT (deep venous thromboembolism), chronic     LE dopplers 5/13, 10/13 and 1/14: chronic DVT involving right mid femoral vein, left mid femoral vein, and left popliteal vein.  . Osteomyelitis     L first foot ray amputation 09/2012  . Gangrene     L 2nd toe amputation 11/2012   ROS: Negative except as per HPI  BP 122/62  Pulse 52  Ht 6' (1.829 m)  Wt 188 lb (85.276 kg)  BMI 25.49 kg/m2  PHYSICAL EXAM: Pt is alert and oriented, pleasant elderly male in NAD HEENT:  normal Neck: JVP - normal, carotids 2+= without bruits Lungs: CTA bilaterally CV: RRR with grade 3/6 harsh systolic murmur best heard at the right upper sternal border Abd: soft, NT, Positive BS, no hepatomegaly Ext: no C/C/E, distal pulses intact and equal Skin: warm/dry no rash  EKG:  Sinus bradycardia 52 beats per minute, age-indeterminate septal infarct versus nonspecific IVCD.  2-D echocardiogram 02/07/2014: Left ventricle: The cavity size was normal. Wall thickness was increased in a pattern of moderate LVH. Systolic function was moderately reduced. The estimated ejection fraction was in the range of 35% to 40%. Regional wall motion abnormalities: Moderate hypokinesis.  ------------------------------------------------------------ Aortic valve: Mildly thickened, moderately calcified leaflets. Doppler: There was moderate stenosis. Mild regurgitation. VTI ratio of LVOT to aortic valve: 0.29. Valve area: 1.02cm^2(VTI). Indexed valve area: 0.5cm^2/m^2 (VTI). Peak velocity ratio of LVOT to aortic valve: 0.31. Valve area: 1.07cm^2 (Vmax). Indexed valve area: 0.52cm^2/m^2 (Vmax). Mean gradient: 21mm Hg (S). Peak gradient: 96mm Hg (S).  ------------------------------------------------------------ Aorta: Aortic root: The aortic root was normal in size. Ascending aorta: The ascending aorta was normal in size.  ------------------------------------------------------------ Mitral valve: Moderately calcified annulus. Moderately thickened leaflets . Doppler: No significant regurgitation.  ------------------------------------------------------------ Left atrium: The atrium was mildly to moderately dilated.  ------------------------------------------------------------ Right ventricle: The cavity size was mildly dilated. Systolic function was normal.  ------------------------------------------------------------ Pulmonic valve: The valve appears to be grossly normal. Doppler: No  significant regurgitation.  ------------------------------------------------------------ Tricuspid valve: The valve appears to be grossly normal. Doppler: Mild regurgitation.  ------------------------------------------------------------ Pulmonary artery: Systolic pressure was within the normal range.  ------------------------------------------------------------ Right atrium: The atrium was mildly to moderately dilated.  ------------------------------------------------------------ Pericardium: There was no pericardial effusion.  ------------------------------------------------------------ Systemic veins: Inferior vena cava: The vessel was normal in size; the respirophasic diameter changes were in the normal range (= 50%); findings are consistent with normal central venous pressure.  ------------------------------------------------------------  2D measurements Normal Doppler measurements Normal Left ventricle Main pulmonary LVID ED, 49.9 mm 43-52 artery chord, Pressure, 28 mm Hg =30 PLAX S LVID ES, 40.1 mm 23-38 Left ventricle chord, Ea, lat 7.57 cm/s ------ PLAX ann, tiss FS, chord, 20 % >29 DP PLAX E/Ea, lat 7.6 ------ LVPW, ED 15.7 mm ------ ann, tiss IVS/LVPW 0.79 <1.3 DP ratio, ED Ea, med 6.25 cm/s ------ Ventricular septum ann, tiss IVS, ED 12.4 mm ------ DP LVOT E/Ea, med 9.2 ------ Diam, S 21 mm ------ ann, tiss Area 3.46 cm^2 ------ DP Diam 21 mm ------ LVOT Aorta Peak vel, 108 cm/s ------ Root diam, 38 mm ------ S ED VTI, S 24.7 cm ------ Left atrium Stroke vol 85.6 ml ------ AP dim 48 mm ------ Stroke 41.9 ml/m^2 ------ AP dim 2.35 cm/m^2 <2.2 index index Aortic valve Peak vel, 350 cm/s ------  S Mean vel, 282 cm/s ------ S VTI, S 83.9 cm ------ Mean 35 mm Hg ------ gradient, S Peak 49 mm Hg ------ gradient, S VTI ratio 0.29 ------ LVOT/AV Area, VTI 1.02 cm^2 ------ Area index 0.5 cm^2/m ------ (VTI) ^2 Peak vel 0.31  ------ ratio, LVOT/AV Area, Vmax 1.07 cm^2 ------ Area index 0.52 cm^2/m ------ (Vmax) ^2 Regurg PHT 935 ms ------ Mitral valve Peak E vel 57.5 cm/s ------ Peak A vel 104 cm/s ------ Decelerati 326 ms 150-23 on time 0 Peak E/A 0.6 ------ ratio Tricuspid valve Regurg 251 cm/s ------ peak vel Peak RV-RA 25 mm Hg ------ gradient, S Systemic veins Estimated 3 mm Hg ------ CVP Right ventricle Pressure, 28 mm Hg <30 S Sa vel, 14.3 cm/s ------ lat ann, tiss DP  ASSESSMENT AND PLAN: 1. Coronary artery disease status post CABG and PCI. He remains stable with no symptoms of angina. He will continue on a combination of aspirin, isosorbide, metoprolol, and pravastatin. I will see him back in 6 months.  2. Moderate aortic stenosis. He remains asymptomatic. However, I am concerned about interval development of LV dysfunction. I have personally reviewed his most recent echo images. He had frequent ectopic beats during his echo study and I think his LVEF is probably a little better than reported. There is some dyssynchrony noted, but I do not appreciate any discrete wall motion abnormalities. As he remains asymptomatic, I think we should just follow him closely. I am going to repeat an echo in 6 months to assure that there is not continued deterioration of LV function which has been previously normal.  3. Hyperlipidemia. Followed by his primary physician.  4. Hypertension. Blood pressure well controlled on current medical program.  For followup I will see him back in 6 months unless cardiac problems arise in the interim.  [name] 03/05/2014 1:39 PM

## 2014-03-07 ENCOUNTER — Other Ambulatory Visit: Payer: Self-pay | Admitting: Dermatology

## 2014-04-25 ENCOUNTER — Other Ambulatory Visit: Payer: Self-pay | Admitting: Cardiovascular Disease

## 2014-05-17 ENCOUNTER — Other Ambulatory Visit (INDEPENDENT_AMBULATORY_CARE_PROVIDER_SITE_OTHER): Payer: Self-pay | Admitting: Surgery

## 2014-05-17 ENCOUNTER — Encounter (INDEPENDENT_AMBULATORY_CARE_PROVIDER_SITE_OTHER): Payer: Self-pay | Admitting: Surgery

## 2014-05-17 ENCOUNTER — Ambulatory Visit (INDEPENDENT_AMBULATORY_CARE_PROVIDER_SITE_OTHER): Payer: Medicare Other | Admitting: Surgery

## 2014-05-17 VITALS — BP 124/74 | HR 63 | Temp 98.5°F | Ht 72.0 in | Wt 185.0 lb

## 2014-05-17 DIAGNOSIS — L03119 Cellulitis of unspecified part of limb: Principal | ICD-10-CM

## 2014-05-17 DIAGNOSIS — L02415 Cutaneous abscess of right lower limb: Secondary | ICD-10-CM | POA: Insufficient documentation

## 2014-05-17 DIAGNOSIS — L02419 Cutaneous abscess of limb, unspecified: Secondary | ICD-10-CM

## 2014-05-17 NOTE — Patient Instructions (Signed)
Change external gauze dressing tomorrow.  Remove dressing and removed packing in 2 days. Shower with soap and water. Cover with dry dressing until no further drainage.  Complete entire course of oral antibiotics as instructed.  Earnstine Regal, MD, [facility] Office: [phone]

## 2014-05-17 NOTE — Progress Notes (Signed)
General Surgery [facility]  Chief Complaint  Patient presents with  . Follow-up    history of cellulitis / abscesses - referral from Dr. Janie Morning    HISTORY: Patient is an 80 year old male seen in December 2014 in our practice for cutaneous abscess. At that time he was treated with oral Bactrim without incision and drainage. Patient improved.  Patient now has a new abscess on the lateral right calf. He was seen by his primary care physician and started on Bactrim. He is referred today for incision and drainage.  PERTINENT REVIEW OF SYSTEMS: Patient notes a lesion on the back of the neck which has dried and diminished in size. Patient notes an active abscess on the lateral right calf.  EXAM: HEENT: normocephalic; pupils equal and reactive; sclerae clear; dentition good; mucous membranes moist NECK:  No palpable masses in the thyroid bed; symmetric on extension; no palpable anterior or posterior cervical lymphadenopathy; no supraclavicular masses; no tenderness CHEST: rhonchi bilaterally without rales or wheezes CARDIAC: regular rate and rhythm without significant murmur; peripheral pulses are full EXT:  Erythematous, indurated, fluctuant lesion on the lateral right calf NEURO: no gross focal deficits; no sign of tremor   PROCEDURE: Under aseptic conditions, local anesthetic is infiltrated into the skin surrounding the abscess. Using a #11 blade the cavity is incised. A 1 cm incision is made. Cloudy purulent fluid is captured on a culture swab and submitted to the laboratory. Cavity is packed with quarter-inch iodoform gauze packing and a dry gauze dressing is placed.  IMPRESSION: Right calf abscess  PLAN: Patient is currently taking Bactrim. A culture was sent to the laboratory for analysis. We will forward those results to Dr. Valetta Fuller.  Local wound care instructions were provided to the patient and his wife.  Patient will return for surgical care as  needed.  Earnstine Regal, MD, [facility] Office: [phone]  Visit Diagnoses: 1. Cellulitis and abscess of leg   2. Abscess of right leg

## 2014-05-20 LAB — WOUND CULTURE

## 2014-05-21 ENCOUNTER — Telehealth (INDEPENDENT_AMBULATORY_CARE_PROVIDER_SITE_OTHER): Payer: Self-pay

## 2014-05-21 NOTE — Telephone Encounter (Signed)
Wd culture here and to Dr Harlow Asa to review.

## 2014-06-17 ENCOUNTER — Other Ambulatory Visit: Payer: Self-pay | Admitting: Dermatology

## 2014-07-21 ENCOUNTER — Encounter (HOSPITAL_COMMUNITY): Payer: Self-pay | Admitting: *Deleted

## 2014-07-21 ENCOUNTER — Emergency Department (HOSPITAL_COMMUNITY): Payer: Medicare Other

## 2014-07-21 ENCOUNTER — Inpatient Hospital Stay (HOSPITAL_COMMUNITY)
Admission: EM | Admit: 2014-07-21 | Discharge: 2014-07-23 | DRG: 307 | Disposition: A | Discharge status: Home / Self Care | Payer: Medicare Other | Attending: Interventional Cardiology | Admitting: Interventional Cardiology

## 2014-07-21 DIAGNOSIS — I679 Cerebrovascular disease, unspecified: Secondary | ICD-10-CM | POA: Diagnosis present

## 2014-07-21 DIAGNOSIS — M1990 Unspecified osteoarthritis, unspecified site: Secondary | ICD-10-CM | POA: Diagnosis present

## 2014-07-21 DIAGNOSIS — I2510 Atherosclerotic heart disease of native coronary artery without angina pectoris: Secondary | ICD-10-CM | POA: Diagnosis present

## 2014-07-21 DIAGNOSIS — I825Y9 Chronic embolism and thrombosis of unspecified deep veins of unspecified proximal lower extremity: Secondary | ICD-10-CM | POA: Diagnosis present

## 2014-07-21 DIAGNOSIS — T463X5A Adverse effect of coronary vasodilators, initial encounter: Secondary | ICD-10-CM | POA: Diagnosis present

## 2014-07-21 DIAGNOSIS — J449 Chronic obstructive pulmonary disease, unspecified: Secondary | ICD-10-CM | POA: Diagnosis present

## 2014-07-21 DIAGNOSIS — I2589 Other forms of chronic ischemic heart disease: Secondary | ICD-10-CM | POA: Diagnosis present

## 2014-07-21 DIAGNOSIS — Z951 Presence of aortocoronary bypass graft: Secondary | ICD-10-CM

## 2014-07-21 DIAGNOSIS — Z7982 Long term (current) use of aspirin: Secondary | ICD-10-CM | POA: Diagnosis not present

## 2014-07-21 DIAGNOSIS — Z881 Allergy status to other antibiotic agents status: Secondary | ICD-10-CM | POA: Diagnosis not present

## 2014-07-21 DIAGNOSIS — Z888 Allergy status to other drugs, medicaments and biological substances status: Secondary | ICD-10-CM | POA: Diagnosis not present

## 2014-07-21 DIAGNOSIS — I25810 Atherosclerosis of coronary artery bypass graft(s) without angina pectoris: Secondary | ICD-10-CM

## 2014-07-21 DIAGNOSIS — E119 Type 2 diabetes mellitus without complications: Secondary | ICD-10-CM | POA: Diagnosis present

## 2014-07-21 DIAGNOSIS — R6510 Systemic inflammatory response syndrome (SIRS) of non-infectious origin without acute organ dysfunction: Secondary | ICD-10-CM | POA: Diagnosis present

## 2014-07-21 DIAGNOSIS — I82509 Chronic embolism and thrombosis of unspecified deep veins of unspecified lower extremity: Secondary | ICD-10-CM | POA: Diagnosis present

## 2014-07-21 DIAGNOSIS — I739 Peripheral vascular disease, unspecified: Secondary | ICD-10-CM | POA: Diagnosis present

## 2014-07-21 DIAGNOSIS — D696 Thrombocytopenia, unspecified: Secondary | ICD-10-CM | POA: Diagnosis present

## 2014-07-21 DIAGNOSIS — C8590 Non-Hodgkin lymphoma, unspecified, unspecified site: Secondary | ICD-10-CM | POA: Diagnosis present

## 2014-07-21 DIAGNOSIS — R0989 Other specified symptoms and signs involving the circulatory and respiratory systems: Secondary | ICD-10-CM

## 2014-07-21 DIAGNOSIS — I10 Essential (primary) hypertension: Secondary | ICD-10-CM | POA: Diagnosis present

## 2014-07-21 DIAGNOSIS — C8319 Mantle cell lymphoma, extranodal and solid organ sites: Secondary | ICD-10-CM

## 2014-07-21 DIAGNOSIS — Z87898 Personal history of other specified conditions: Secondary | ICD-10-CM | POA: Diagnosis not present

## 2014-07-21 DIAGNOSIS — I350 Nonrheumatic aortic (valve) stenosis: Secondary | ICD-10-CM

## 2014-07-21 DIAGNOSIS — D731 Hypersplenism: Secondary | ICD-10-CM

## 2014-07-21 DIAGNOSIS — Z9861 Coronary angioplasty status: Secondary | ICD-10-CM | POA: Diagnosis not present

## 2014-07-21 DIAGNOSIS — Z87891 Personal history of nicotine dependence: Secondary | ICD-10-CM | POA: Diagnosis not present

## 2014-07-21 DIAGNOSIS — I359 Nonrheumatic aortic valve disorder, unspecified: Secondary | ICD-10-CM | POA: Diagnosis present

## 2014-07-21 DIAGNOSIS — R55 Syncope and collapse: Secondary | ICD-10-CM | POA: Diagnosis present

## 2014-07-21 DIAGNOSIS — I779 Disorder of arteries and arterioles, unspecified: Secondary | ICD-10-CM | POA: Diagnosis present

## 2014-07-21 DIAGNOSIS — E785 Hyperlipidemia, unspecified: Secondary | ICD-10-CM | POA: Diagnosis present

## 2014-07-21 DIAGNOSIS — R42 Dizziness and giddiness: Secondary | ICD-10-CM | POA: Diagnosis not present

## 2014-07-21 DIAGNOSIS — J4489 Other specified chronic obstructive pulmonary disease: Secondary | ICD-10-CM | POA: Diagnosis present

## 2014-07-21 DIAGNOSIS — N400 Enlarged prostate without lower urinary tract symptoms: Secondary | ICD-10-CM | POA: Diagnosis present

## 2014-07-21 DIAGNOSIS — R509 Fever, unspecified: Secondary | ICD-10-CM

## 2014-07-21 DIAGNOSIS — C8310 Mantle cell lymphoma, unspecified site: Secondary | ICD-10-CM | POA: Diagnosis present

## 2014-07-21 HISTORY — DX: Nonrheumatic aortic (valve) stenosis: I35.0

## 2014-07-21 LAB — TROPONIN I: Troponin I: <0.3 ng/mL (ref <0.30)

## 2014-07-21 LAB — CBC
HCT: 36 % — ABNORMAL LOW (ref 39.0–52.0)
Hemoglobin: 12 g/dL — ABNORMAL LOW (ref 13.0–17.0)
MCH: 32.4 pg (ref 26.0–34.0)
MCHC: 33.3 g/dL (ref 30.0–36.0)
MCV: 97.3 fL (ref 78.0–100.0)
PLATELETS: 88 10*3/uL — AB (ref 150–400)
RBC: 3.7 MIL/uL — ABNORMAL LOW (ref 4.22–5.81)
RDW: 15.6 % — AB (ref 11.5–15.5)
WBC: 4.1 10*3/uL (ref 4.0–10.5)

## 2014-07-21 LAB — BASIC METABOLIC PANEL
ANION GAP: 12 (ref 5–15)
BUN: 23 mg/dL (ref 6–23)
CHLORIDE: 100 meq/L (ref 96–112)
CO2: 25 mEq/L (ref 19–32)
Calcium: 9.8 mg/dL (ref 8.4–10.5)
Creatinine, Ser: 1.01 mg/dL (ref 0.50–1.35)
GFR calc Af Amer: 77 mL/min — ABNORMAL LOW (ref >90)
GFR calc non Af Amer: 66 mL/min — ABNORMAL LOW (ref >90)
Glucose, Bld: 143 mg/dL — ABNORMAL HIGH (ref 70–99)
Potassium: 4.2 mEq/L (ref 3.7–5.3)
SODIUM: 137 meq/L (ref 137–147)

## 2014-07-21 LAB — PROTIME-INR
INR: 1.07 (ref 0.00–1.49)
PROTHROMBIN TIME: 13.9 s (ref 11.6–15.2)

## 2014-07-21 LAB — PRO B NATRIURETIC PEPTIDE: Pro B Natriuretic peptide (BNP): 1058 pg/mL — ABNORMAL HIGH (ref 0–450)

## 2014-07-21 LAB — I-STAT TROPONIN, ED: Troponin i, poc: 0.01 ng/mL (ref 0.00–0.08)

## 2014-07-21 LAB — GLUCOSE, CAPILLARY
Glucose-Capillary: 174 mg/dL — ABNORMAL HIGH (ref 70–99)
Glucose-Capillary: 231 mg/dL — ABNORMAL HIGH (ref 70–99)

## 2014-07-21 MED ORDER — GABAPENTIN 300 MG PO CAPS
300.0000 mg | ORAL_CAPSULE | Freq: Every day | ORAL | Status: DC
  Administered 2014-07-21 – 2014-07-22 (×2): 300 mg via ORAL
  Filled 2014-07-21 (×4): qty 1

## 2014-07-21 MED ORDER — ACETAMINOPHEN 650 MG RE SUPP: 650.0000 mg | Freq: Once | RECTAL | Status: DC

## 2014-07-21 MED ORDER — GABAPENTIN 300 MG PO CAPS: 300.0000 mg | ORAL_CAPSULE | Freq: Three times a day (TID) | ORAL | Status: DC

## 2014-07-21 MED ORDER — ASPIRIN EC 81 MG PO TBEC
81.0000 mg | DELAYED_RELEASE_TABLET | ORAL | Status: DC
  Administered 2014-07-22 – 2014-07-23 (×2): 81 mg via ORAL
  Filled 2014-07-21 (×4): qty 1

## 2014-07-21 MED ORDER — GABAPENTIN 600 MG PO TABS
600.0000 mg | ORAL_TABLET | Freq: Two times a day (BID) | ORAL | Status: DC
  Administered 2014-07-21 – 2014-07-23 (×4): 600 mg via ORAL
  Filled 2014-07-21 (×9): qty 1

## 2014-07-21 MED ORDER — VANCOMYCIN HCL IN DEXTROSE 750-5 MG/150ML-% IV SOLN
750.0000 mg | Freq: Two times a day (BID) | INTRAVENOUS | Status: DC
  Administered 2014-07-21 – 2014-07-22 (×2): 750 mg via INTRAVENOUS
  Filled 2014-07-21 (×4): qty 150

## 2014-07-21 MED ORDER — ISOSORBIDE MONONITRATE ER 30 MG PO TB24
30.0000 mg | ORAL_TABLET | Freq: Every day | ORAL | Status: DC
  Administered 2014-07-22 – 2014-07-23 (×2): 30 mg via ORAL
  Filled 2014-07-21 (×3): qty 1

## 2014-07-21 MED ORDER — METFORMIN HCL 500 MG PO TABS
500.0000 mg | ORAL_TABLET | Freq: Two times a day (BID) | ORAL | Status: DC
  Administered 2014-07-21 – 2014-07-22 (×2): 500 mg via ORAL
  Filled 2014-07-21 (×4): qty 1

## 2014-07-21 MED ORDER — ASPIRIN 81 MG PO CHEW
324.0000 mg | CHEWABLE_TABLET | Freq: Once | ORAL | Status: AC
  Administered 2014-07-21: 324 mg via ORAL
  Filled 2014-07-21: qty 4

## 2014-07-21 MED ORDER — SIMVASTATIN 20 MG PO TABS
20.0000 mg | ORAL_TABLET | Freq: Every day | ORAL | Status: DC
  Administered 2014-07-22: 20 mg via ORAL
  Filled 2014-07-21 (×3): qty 1

## 2014-07-21 MED ORDER — PIPERACILLIN-TAZOBACTAM 3.375 G IVPB
3.3750 g | Freq: Three times a day (TID) | INTRAVENOUS | Status: DC
  Administered 2014-07-21 – 2014-07-22 (×3): 3.375 g via INTRAVENOUS
  Filled 2014-07-21 (×5): qty 50

## 2014-07-21 MED ORDER — ACETAMINOPHEN 325 MG PO TABS
650.0000 mg | ORAL_TABLET | Freq: Four times a day (QID) | ORAL | Status: DC | PRN
  Administered 2014-07-21: 650 mg via ORAL
  Filled 2014-07-21: qty 2

## 2014-07-21 MED ORDER — CYANOCOBALAMIN 500 MCG PO TABS
1000.0000 ug | ORAL_TABLET | Freq: Every day | ORAL | Status: DC
  Administered 2014-07-22 – 2014-07-23 (×2): 1000 ug via ORAL
  Filled 2014-07-21 (×4): qty 2

## 2014-07-21 NOTE — H&P (Signed)
Chief Complaint:  Syncope  Cardiologist: Burt Knack  HPI:  This is a 80 y.o. male with a past medical history significant for CAD (CABG 1995, bare metal stent SVG-PDA 2013), ischemic cardiomyopathy (EF dropped from 50% to 35-40% by echo March 2014), moderate AS (mean grad 47, AVA 1 cmsq), chronic nonspecific IVCD, PAD, DM, chronicDVT (right mid femoral vein, left mid femoral vein, and left popliteal vein).  mantle cell lymphoma (splenomegaly with thrombocytopenia, so taken off plavix).  No recent health problems. While sitting in church he felt "strange" and lightheaded. Took one SL NTG and passed out briefly. No seizure activity or postictal state. Never had dyspnea or chest pain (past angina was retrosternal tightness). Feels completely back to normal. No exertional complaints.  PMHx:  Past Medical History  Diagnosis Date  . CAD (coronary artery disease)     a. s/p CABG 1995;  b. NSTEMI & subsequent BMS to SVG->right PDA 02/03/12.;  c. Cath 02/14/2012 3vd with 4/4 patent grafts and patent stent in vg->pda.  . Carotid stenosis     a. Carotid dopplers 8/78: RICA 67-67%, LICA 2-09%;  b. dopplers 3/14:  47-09% RICA, 6-28% LICA  . HTN (hypertension)   . HLD (hyperlipidemia)   . DM2 (diabetes mellitus, type 2)   . DJD (degenerative joint disease)   . Asthmatic bronchitis   . BPH (benign prostatic hypertrophy)   . Herpes zoster   . Anemia   . Thrombocytopenia     a. secondary to splenomegaly related to lymphoma with suspicion of bone marrow involvement. (Plavix had to be stopped due to this)  . Aortic stenosis, moderate     a. Echo 02/14/12 EF 55%, Gr 2 DD, Mod AS (mean grad: 29, peak grad 52)  . Splenomegaly 04/13/2012  . History of blood transfusion 04/13/2012  . 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 Dermatitis 06/07/2012    acnieform rash on back 06/06/12  . COPD (chronic obstructive pulmonary disease)   . Peripheral  vascular disease     a. s/p L ray amp 10/13;  b. s/p L 2nd toe amp 12/13  . Pulmonary nodule     16m RUL calcified pulm nodule noted on CT staging for lymphoma - stable 10/2012.  . Leg DVT (deep venous thromboembolism), chronic     LE dopplers 5/13, 10/13 and 1/14: chronic DVT involving right mid femoral vein, left mid femoral vein, and left popliteal vein.  . Osteomyelitis     L first foot ray amputation 09/2012  . Gangrene     L 2nd toe amputation 11/2012    Past Surgical History  Procedure Laterality Date  . Coronary artery bypass graft  1995  . Rotator cuff repair    . Carpal tunnel release    . Incisional hernia repair    . Kidney surgery    . Back surgery    . I&d extremity  09/16/2012    Procedure: IRRIGATION AND DEBRIDEMENT EXTREMITY;  Surgeon: JWylene Simmer MD;  Location: [facility];  Service: Orthopedics;  Laterality: Left;  irrigation and debridement and first Ray amputation of left foot  . Amputation  09/16/2012    Procedure: AMPUTATION RAY;  Surgeon: JWylene Simmer MD;  Location: [facility];  Service: Orthopedics;  Laterality: Left;  first Ray amputation left foot  . Amputation  11/21/2012    Procedure: AMPUTATION RAY;  Surgeon: JWylene Simmer MD;  Location: [facility]  Service: Orthopedics;  Laterality: Left;  LEFT SECOND TOE AMPUTATION  . I&d extremity Left 03/01/2013    Procedure: IRRIGATION AND DEBRIDEMENT EXTREMITY;  Surgeon: Wylene Simmer, MD;  Location: [facility];  Service: Orthopedics;  Laterality: Left;  IRRIGATION  AND  DEBRIDEMENT  LEFT  FOOT  . Amputation Left 12/20/2013    Procedure: AMPUTATION LEFT 5TH TRANSMETATARSAL;  Surgeon: Wylene Simmer, MD;  Location: [place];  Service: Orthopedics;  Laterality: Left;  . Achilles tendon lengthening Left 12/20/2013    Procedure: ACHILLES TENDON LENGTHENING;  Surgeon: Wylene Simmer, MD;  Location: [place];  Service: Orthopedics;  Laterality: Left;    FAMHx:  Family History  Problem Relation Age of Onset  . Coronary artery disease    . Alcohol  abuse      SOCHx:   reports that he quit smoking about 29 years ago. His smoking use included Cigarettes and Pipe. He has a 30 pack-year smoking history. He has never used smokeless tobacco. He reports that he does not drink alcohol or use illicit drugs.  ALLERGIES:  Allergies  Allergen Reactions  . Losartan     Hyperkalemia > 7  . Azithromycin Itching  . Doxycycline Itching       . Keflex [Cephalexin] Itching  . Macrodantin Itching  . Minocycline Hcl Itching  . Nitrofurantoin Itching  . Zithromax [Azithromycin Dihydrate] Itching  . Contrast Media [Iodinated Diagnostic Agents] Rash    ROS: The patient specifically denies any chest pain at rest or with exertion, dyspnea at rest or with exertion, orthopnea, paroxysmal nocturnal dyspnea, palpitations, focal neurological deficits, intermittent claudication, lower extremity edema, unexplained weight gain, cough, hemoptysis or wheezing.  The patient also denies abdominal pain, nausea, vomiting, dysphagia, diarrhea, constipation, polyuria, polydipsia, dysuria, hematuria, frequency, urgency, abnormal bleeding or bruising, fever, chills, unexpected weight changes, mood swings, change in skin or hair texture, change in voice quality, auditory or visual problems, allergic reactions or rashes, new musculoskeletal complaints other than usual "aches and pains".   HOME MEDS: No current facility-administered medications on file prior to encounter.   Current Outpatient Prescriptions on File Prior to Encounter  Medication Sig Dispense Refill  . aspirin EC 81 MG tablet Take 81 mg by mouth every morning.       . gabapentin (NEURONTIN) 300 MG capsule Take 300-600 mg by mouth 3 (three) times daily. Take 600 mg in the morning, 600 mg at midday, and 300 mg at bedtime.      . metFORMIN (GLUCOPHAGE) 500 MG tablet Take 500 mg by mouth 2 (two) times daily with a meal.       . metoprolol tartrate (LOPRESSOR) 25 MG tablet Take 12.5 mg by mouth 2 (two) times  daily.      . pravastatin (PRAVACHOL) 40 MG tablet Take 40 mg by mouth daily.      . vitamin B-12 (CYANOCOBALAMIN) 500 MCG tablet Take 1,000 mcg by mouth daily.        LABS/IMAGING: Results for orders placed during the hospital encounter of 07/21/14 (from the past 48 hour(s))  CBC     Status: Abnormal   Collection Time    07/21/14 12:07 PM      Result Value Ref Range   WBC 4.1  4.0 - 10.5 K/uL   RBC 3.70 (*) 4.22 - 5.81 MIL/uL   Hemoglobin 12.0 (*) 13.0 - 17.0 g/dL   HCT 36.0 (*) 39.0 - 52.0 %   MCV 97.3  78.0 - 100.0 fL   MCH 32.4  26.0 - 34.0 pg  MCHC 33.3  30.0 - 36.0 g/dL   RDW 15.6 (*) 11.5 - 15.5 %   Platelets 88 (*) 150 - 400 K/uL   Comment: PLATELET COUNT CONFIRMED BY SMEAR  BASIC METABOLIC PANEL     Status: Abnormal   Collection Time    07/21/14 12:07 PM      Result Value Ref Range   Sodium 137  137 - 147 mEq/L   Potassium 4.2  3.7 - 5.3 mEq/L   Chloride 100  96 - 112 mEq/L   CO2 25  19 - 32 mEq/L   Glucose, Bld 143 (*) 70 - 99 mg/dL   BUN 23  6 - 23 mg/dL   Creatinine, Ser 1.01  0.50 - 1.35 mg/dL   Calcium 9.8  8.4 - 10.5 mg/dL   GFR calc non Af Amer 66 (*) >90 mL/min   GFR calc Af Amer 77 (*) >90 mL/min   Comment: (NOTE)     The eGFR has been calculated using the CKD EPI equation.     This calculation has not been validated in all clinical situations.     eGFR's persistently <90 mL/min signify possible Chronic Kidney     Disease.   Anion gap 12  5 - 15  PROTIME-INR     Status: None   Collection Time    07/21/14 12:07 PM      Result Value Ref Range   Prothrombin Time 13.9  11.6 - 15.2 seconds   INR 1.07  0.00 - 1.49  PRO B NATRIURETIC PEPTIDE     Status: Abnormal   Collection Time    07/21/14 12:07 PM      Result Value Ref Range   Pro B Natriuretic peptide (BNP) 1058.0 (*) 0 - 450 pg/mL  I-STAT TROPOININ, ED     Status: None   Collection Time    07/21/14 12:09 PM      Result Value Ref Range   Troponin i, poc 0.01  0.00 - 0.08 ng/mL   Comment 3             Comment: Due to the release kinetics of cTnI,     a negative result within the first hours     of the onset of symptoms does not rule out     myocardial infarction with certainty.     If myocardial infarction is still suspected,     repeat the test at appropriate intervals.   Ct Head Wo Contrast  07/21/2014   CLINICAL DATA:  Syncopal episode.  EXAM: CT HEAD WITHOUT CONTRAST  TECHNIQUE: Contiguous axial images were obtained from the base of the skull through the vertex without intravenous contrast.  COMPARISON:  Brain MRI, 09/26/2010.  FINDINGS: Ventricles are normal configuration. There is ventricular and sulcal enlargement reflecting age related volume loss. There are no parenchymal masses or mass effect. There is no evidence of an infarct. There are no extra-axial masses or abnormal fluid collections.  No intracranial hemorrhage.  Minor ethmoid sinus mucosal thickening. Remaining visualized sinuses and mastoid air cells are clear. No skull lesion.  IMPRESSION: 1. No acute intracranial abnormalities. 2. Age related volume loss.  Minor ethmoid sinus mucosal thickening.   Electronically Signed   By: Lajean Manes M.D.   On: 07/21/2014 13:26   Dg Chest Port 1 View  07/21/2014   CLINICAL DATA:  Altered mental status  EXAM: PORTABLE CHEST - 1 VIEW  COMPARISON:  09/12/2013  FINDINGS: Cardiomegaly. Bibasilar atelectasis. Low volumes. No pneumothorax. Postoperative  change.  IMPRESSION: Bibasilar atelectasis.   Electronically Signed   By: Maryclare Bean M.D.   On: 07/21/2014 12:39    VITALS: Blood pressure 144/125, pulse 35, temperature 97.8 F (36.6 C), temperature source Oral, resp. rate 19, SpO2 97.00%.  EXAM:  General: Alert, oriented x3, no distress Head: no evidence of trauma, PERRL, EOMI, no exophtalmos or lid lag, no myxedema, no xanthelasma; normal ears, nose and oropharynx Neck: normal jugular venous pulsations and no hepatojugular reflux; brisk but low volume carotid pulses soft radiating  carotid bruits Chest: clear to auscultation, no signs of consolidation by percussion or palpation, normal fremitus, symmetrical and full respiratory excursions, sternotomy Cardiovascular: normal position and quality of the apical impulse, regular rhythm, normal first heart sound and paradoxically split second heart sounds, no rubs or gallops, 3/6 mid peaking systolic ejection murmur Abdomen: no tenderness or distention, no masses by palpation, no abnormal pulsatility or arterial bruits, normal bowel sounds, no hepatosplenomegaly Extremities: no clubbing, cyanosis or edema; 2+ radial, ulnar and brachial pulses bilaterally; 2+ right femoral, posterior tibial and dorsalis pedis pulses; 2+ left femoral, posterior tibial and dorsalis pedis pulses; no subclavian or femoral bruits Neurological: grossly nonfocal   IMPRESSION: 1. Syncope - nonexertional. May have been NTG-related (especially if AS is severe), but initial symptoms were probably arrhythmic.  2. CAD s/p CABG and PCI to SVG - angina free 3. AS - at least moderate. Dr. Burt Knack was planning a f/u echo in a few weeks time  4. IVCD - increases likelihood of bradyarrhythmic syncope 5. Moderate (ischemic?) cardiomyopathy - raises concern for ventricular arrhythmia  PLAN: Telemetry Hold beta blocker Reevaluate AS by echo Check cardiac enzymes (but suspicion for true ACS is low) If no cause of syncope is identified, strongly consider implantable loop recorder  Sanda Klein, MD, [facility] [phone] office [phone] pager  07/21/2014, 2:05 PM

## 2014-07-21 NOTE — Progress Notes (Signed)
ANTIBIOTIC CONSULT NOTE - INITIAL  Pharmacy Consult for Vancomycin and Zosyn Indication: rule out sepsis  Allergies  Allergen Reactions  . Losartan     Hyperkalemia > 7  . Azithromycin Itching  . Doxycycline Itching       . Keflex [Cephalexin] Itching  . Macrodantin Itching  . Minocycline Hcl Itching  . Nitrofurantoin Itching  . Zithromax [Azithromycin Dihydrate] Itching  . Contrast Media [Iodinated Diagnostic Agents] Rash    Patient Measurements:   Weight = 84 kg Height = 72 inches  Vital Signs: Temp: 102.5 F (39.2 C) (08/16 1809) Temp src: Oral (08/16 1518) BP: 155/75 mmHg (08/16 1809) Pulse Rate: 115 (08/16 1809) Intake/Output from previous day:   Intake/Output from this shift: Total I/O In: -  Out: 400 [Urine:400]  Labs:  Recent Labs  07/21/14 1207  WBC 4.1  HGB 12.0*  PLT 88*  CREATININE 1.01   Estimated CrCl  55 ml/min  Microbiology: No results found for this or any previous visit (from the past 720 hour(s)).  Medical History: Past Medical History  Diagnosis Date  . CAD (coronary artery disease)     a. s/p CABG 1995;  b. NSTEMI & subsequent BMS to SVG->right PDA 02/03/12.;  c. Cath 02/14/2012 3vd with 4/4 patent grafts and patent stent in vg->pda.  . Carotid stenosis     a. Carotid dopplers 5/68: RICA 12-75%, LICA 1-70%;  b. dopplers 3/14:  01-74% RICA, 9-44% LICA  . HTN (hypertension)   . HLD (hyperlipidemia)   . DM2 (diabetes mellitus, type 2)   . DJD (degenerative joint disease)   . Asthmatic bronchitis   . BPH (benign prostatic hypertrophy)   . Herpes zoster   . Anemia   . Thrombocytopenia     a. secondary to splenomegaly related to lymphoma with suspicion of bone marrow involvement. (Plavix had to be stopped due to this)  . Aortic stenosis, moderate     a. Echo 02/14/12 EF 55%, Gr 2 DD, Mod AS (mean grad: 29, peak grad 52)  . Splenomegaly 04/13/2012  . History of blood transfusion 04/13/2012  . 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 Dermatitis 06/07/2012    acnieform rash on back 06/06/12  . COPD (chronic obstructive pulmonary disease)   . Peripheral vascular disease     a. s/p L ray amp 10/13;  b. s/p L 2nd toe amp 12/13  . Pulmonary nodule     7mm RUL calcified pulm nodule noted on CT staging for lymphoma - stable 10/2012.  . Leg DVT (deep venous thromboembolism), chronic     LE dopplers 5/13, 10/13 and 1/14: chronic DVT involving right mid femoral vein, left mid femoral vein, and left popliteal vein.  . Osteomyelitis     L first foot ray amputation 09/2012  . Gangrene     L 2nd toe amputation 11/2012    Medications:  Prescriptions prior to admission  Medication Sig Dispense Refill  . aspirin EC 81 MG tablet Take 81 mg by mouth every morning.       . gabapentin (NEURONTIN) 300 MG capsule Take 300-600 mg by mouth 3 (three) times daily. Take 600 mg in the morning, 600 mg at midday, and 300 mg at bedtime.      . isosorbide mononitrate (IMDUR) 30 MG 24 hr tablet Take 30 mg by mouth daily.      . metFORMIN (GLUCOPHAGE) 500 MG tablet Take 500 mg  by mouth 2 (two) times daily with a meal.       . metoprolol tartrate (LOPRESSOR) 25 MG tablet Take 12.5 mg by mouth 2 (two) times daily.      . pravastatin (PRAVACHOL) 40 MG tablet Take 40 mg by mouth daily.      . vitamin B-12 (CYANOCOBALAMIN) 500 MCG tablet Take 1,000 mcg by mouth daily.        Assessment: 80 yo M who presented to the ED after a syncopal event.  Pt feels completely normal and was admitted for further monitoring.  Now patient has developed fever (Tm 102.5) and rigors.  To start Vancomycin and Zosyn for SIRS.    Goal of Therapy:  Vancomycin trough level 15-20 mcg/ml Renal dose adjustment of antibiotics  Plan:  Vancomycin 750 mg IV q12h. Zosyn 3.375 gm IV q8h (4 hour infusion). Follow up culture data and renal function. Vancomycin trough level as indicated.   Gemini Bunte, Rocky Crafts 07/21/2014,6:39 PM

## 2014-07-21 NOTE — Significant Event (Signed)
Entered patients room, patient has complaints of fever, chills, nausea, and overall not feeling well.  Patient vital signs checked, blood pressure 155/75, pulse 115, temperature 102.5 F.  On call Cardiology physician paged to inform of patients symptoms.  Awaiting call back.  Will continue to monitor patient.  Dirk Dress 07/21/2014

## 2014-07-21 NOTE — Progress Notes (Signed)
Patient arrived to unit per Emergency Room per stretcher accompanied by ER tech, daughter and wife.  Patient alert, oriented, verbally responsive, breathing regular and non-labored throughout, no s/s of distress noted throughout, no c/o pain throughout.  Patient and family oriented to unit and room, needs attended to.  VS WNL.  Will continue to monitor.  Dirk Dress 07/21/2014 3:12 PM

## 2014-07-21 NOTE — Progress Notes (Signed)
Report received from ER nurse, awaiting patients arrival.  [name] 07/21/2014 2:39 PM

## 2014-07-21 NOTE — Significant Event (Signed)
Return page received from Dr. Tommi Rumps.  Informed of patients symptoms, orders to be placed for blood cultures, U/A and culture, and Tylenol.  Will continue to monitor patient.  Dirk Dress 07/21/2014

## 2014-07-21 NOTE — ED Notes (Signed)
Cardiology MD at bedside.

## 2014-07-21 NOTE — ED Provider Notes (Signed)
CSN: [id number]     Arrival date & time 07/21/14  1147 History   First MD Initiated Contact with Patient 07/21/14 1210     Chief Complaint  Patient presents with  . Atrial Fibrillation     (Consider location/radiation/quality/duration/timing/severity/associated sxs/prior Treatment) HPI Comments: Patient reports syncopal episode while sitting in church. He began to feel lightheaded and dizzy and took one nitroglycerin. He was leaning to the side of the tissue and someone else called EMS. Denies any chest pain or shortness of breath. He describes a lightheaded sensation denies any vertigo or room spinning. Denies any focal weakness, numbness or tingling. Endorses a gradual onset headache ever since taking nitroglycerin. Denies abdominal pain, nausea, vomiting, fever, cough or chills.   past medical history significant for CAD (CABG 1995, bare metal stent SVG-PDA 2013), ischemic cardiomyopathy (EF dropped from 50% to 35-40% by echo March 2014), moderate AS (mean grad 17, AVA 1 cmsq), chronic nonspecific IVCD, PAD, DM, chronicDVT (right mid femoral vein, left mid femoral vein, and left popliteal vein).  mantle cell lymphoma (splenomegaly with thrombocytopenia, so taken off plavix).   The history is provided by the EMS personnel and the patient. The history is limited by the condition of the patient.    Past Medical History  Diagnosis Date  . CAD (coronary artery disease)     a. s/p CABG 1995;  b. NSTEMI & subsequent BMS to SVG->right PDA 02/03/12.;  c. Cath 02/14/2012 3vd with 4/4 patent grafts and patent stent in vg->pda.  . Carotid stenosis     a. Carotid dopplers 4/69: RICA 62-95%, LICA 2-84%;  b. dopplers 3/14:  13-24% RICA, 4-01% LICA  . HTN (hypertension)   . HLD (hyperlipidemia)   . DM2 (diabetes mellitus, type 2)   . DJD (degenerative joint disease)   . Asthmatic bronchitis   . BPH (benign prostatic hypertrophy)   . Herpes zoster   . Anemia   . Thrombocytopenia     a. secondary to  splenomegaly related to lymphoma with suspicion of bone marrow involvement. (Plavix had to be stopped due to this)  . Aortic stenosis, moderate     a. Echo 02/14/12 EF 55%, Gr 2 DD, Mod AS (mean grad: 29, peak grad 52)  . Splenomegaly 04/13/2012  . History of blood transfusion 04/13/2012  . 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 Dermatitis 06/07/2012    acnieform rash on back 06/06/12  . COPD (chronic obstructive pulmonary disease)   . Peripheral vascular disease     a. s/p L ray amp 10/13;  b. s/p L 2nd toe amp 12/13  . Pulmonary nodule     34mm RUL calcified pulm nodule noted on CT staging for lymphoma - stable 10/2012.  . Leg DVT (deep venous thromboembolism), chronic     LE dopplers 5/13, 10/13 and 1/14: chronic DVT involving right mid femoral vein, left mid femoral vein, and left popliteal vein.  . Osteomyelitis     L first foot ray amputation 09/2012  . Gangrene     L 2nd toe amputation 11/2012   Past Surgical History  Procedure Laterality Date  . Coronary artery bypass graft  1995  . Rotator cuff repair    . Carpal tunnel release    . Incisional hernia repair    . Kidney surgery    . Back surgery    . I&d extremity  09/16/2012    Procedure: IRRIGATION AND DEBRIDEMENT  EXTREMITY;  Surgeon: Wylene Simmer, MD;  Location: [facility];  Service: Orthopedics;  Laterality: Left;  irrigation and debridement and first Ray amputation of left foot  . Amputation  09/16/2012    Procedure: AMPUTATION RAY;  Surgeon: Wylene Simmer, MD;  Location: [facility];  Service: Orthopedics;  Laterality: Left;  first Ray amputation left foot  . Amputation  11/21/2012    Procedure: AMPUTATION RAY;  Surgeon: Wylene Simmer, MD;  Location: [place];  Service: Orthopedics;  Laterality: Left;  LEFT SECOND TOE AMPUTATION  . I&d extremity Left 03/01/2013    Procedure: IRRIGATION AND DEBRIDEMENT EXTREMITY;  Surgeon: Wylene Simmer, MD;  Location: [facility];  Service:  Orthopedics;  Laterality: Left;  IRRIGATION  AND  DEBRIDEMENT  LEFT  FOOT  . Amputation Left 12/20/2013    Procedure: AMPUTATION LEFT 5TH TRANSMETATARSAL;  Surgeon: Wylene Simmer, MD;  Location: [place];  Service: Orthopedics;  Laterality: Left;  . Achilles tendon lengthening Left 12/20/2013    Procedure: ACHILLES TENDON LENGTHENING;  Surgeon: Wylene Simmer, MD;  Location: [place];  Service: Orthopedics;  Laterality: Left;   Family History  Problem Relation Age of Onset  . Coronary artery disease    . Alcohol abuse     History  Substance Use Topics  . Smoking status: Former Smoker -- 1.00 packs/day for 30 years    Types: Cigarettes, Pipe    Quit date: 03/29/1985  . Smokeless tobacco: Never Used     Comment: quit in 1986  . Alcohol Use: No    Review of Systems  Constitutional: Positive for fatigue. Negative for fever, activity change and appetite change.  HENT: Negative for congestion and rhinorrhea.   Eyes: Negative for visual disturbance.  Respiratory: Negative for cough, chest tightness and shortness of breath.   Cardiovascular: Negative for chest pain.  Gastrointestinal: Negative for nausea, vomiting and abdominal pain.  Genitourinary: Negative for dysuria and hematuria.  Musculoskeletal: Negative for arthralgias and myalgias.  Skin: Negative for wound.  Neurological: Positive for dizziness, weakness and light-headedness. Negative for numbness and headaches.   A complete 10 system review of systems was obtained and all systems are negative except as noted in the HPI and PMH.     Allergies  Losartan; Azithromycin; Doxycycline; Keflex; Macrodantin; Minocycline hcl; Nitrofurantoin; Zithromax; and Contrast media  Home Medications   Prior to Admission medications   Medication Sig Start Date End Date Taking? Authorizing Provider  aspirin EC 81 MG tablet Take 81 mg by mouth every morning.    Yes Historical Provider, MD  gabapentin (NEURONTIN) 300 MG capsule Take 300-600 mg by mouth 3  (three) times daily. Take 600 mg in the morning, 600 mg at midday, and 300 mg at bedtime. 03/03/13  Yes Haywood Pao, MD  isosorbide mononitrate (IMDUR) 30 MG 24 hr tablet Take 30 mg by mouth daily.   Yes Historical Provider, MD  metFORMIN (GLUCOPHAGE) 500 MG tablet Take 500 mg by mouth 2 (two) times daily with a meal.    Yes Historical Provider, MD  metoprolol tartrate (LOPRESSOR) 25 MG tablet Take 12.5 mg by mouth 2 (two) times daily.   Yes Historical Provider, MD  pravastatin (PRAVACHOL) 40 MG tablet Take 40 mg by mouth daily.   Yes Historical Provider, MD  vitamin B-12 (CYANOCOBALAMIN) 500 MCG tablet Take 1,000 mcg by mouth daily.    Yes Historical Provider, MD   BP 140/47  Pulse 113  Temp(Src) 98.2 F (36.8 C) (Oral)  Resp 20  SpO2 98% Physical Exam  Nursing note and vitals reviewed. Constitutional: He is oriented to person, place, and time. He appears well-developed and well-nourished. No distress.  HENT:  Head: Normocephalic and atraumatic.  Mouth/Throat: Oropharynx is clear and moist. No oropharyngeal exudate.  Eyes: Conjunctivae and EOM are normal. Pupils are equal, round, and reactive to light.  Neck: Normal range of motion. Neck supple.  No meningismus.  Cardiovascular: Normal rate, regular rhythm and intact distal pulses.   Murmur heard. AS murmur, 3/6  Pulmonary/Chest: Effort normal and breath sounds normal. No respiratory distress.  Abdominal: Soft. There is no tenderness. There is no rebound and no guarding.  Musculoskeletal: Normal range of motion. He exhibits no edema and no tenderness.  Neurological: He is alert and oriented to person, place, and time. No cranial nerve deficit. He exhibits normal muscle tone. Coordination normal.  No ataxia on finger to nose bilaterally. No pronator drift. 5/5 strength throughout. CN 2-12 intact. Negative Romberg. Equal grip strength. Sensation intact. Gait is normal.   Skin: Skin is warm.  Psychiatric: He has a normal mood and  affect. His behavior is normal.    ED Course  Procedures (including critical care time) Labs Review Labs Reviewed  CBC - Abnormal; Notable for the following:    RBC 3.70 (*)    Hemoglobin 12.0 (*)    HCT 36.0 (*)    RDW 15.6 (*)    Platelets 88 (*)    All other components within normal limits  BASIC METABOLIC PANEL - Abnormal; Notable for the following:    Glucose, Bld 143 (*)    GFR calc non Af Amer 66 (*)    GFR calc Af Amer 77 (*)    All other components within normal limits  PRO B NATRIURETIC PEPTIDE - Abnormal; Notable for the following:    Pro B Natriuretic peptide (BNP) 1058.0 (*)    All other components within normal limits  PROTIME-INR  TROPONIN I  TROPONIN I  TROPONIN I  I-STAT TROPOININ, ED    Imaging Review Ct Head Wo Contrast  07/21/2014   CLINICAL DATA:  Syncopal episode.  EXAM: CT HEAD WITHOUT CONTRAST  TECHNIQUE: Contiguous axial images were obtained from the base of the skull through the vertex without intravenous contrast.  COMPARISON:  Brain MRI, 09/26/2010.  FINDINGS: Ventricles are normal configuration. There is ventricular and sulcal enlargement reflecting age related volume loss. There are no parenchymal masses or mass effect. There is no evidence of an infarct. There are no extra-axial masses or abnormal fluid collections.  No intracranial hemorrhage.  Minor ethmoid sinus mucosal thickening. Remaining visualized sinuses and mastoid air cells are clear. No skull lesion.  IMPRESSION: 1. No acute intracranial abnormalities. 2. Age related volume loss.  Minor ethmoid sinus mucosal thickening.   Electronically Signed   By: Lajean Manes M.D.   On: 07/21/2014 13:26   Dg Chest Port 1 View  07/21/2014   CLINICAL DATA:  Altered mental status  EXAM: PORTABLE CHEST - 1 VIEW  COMPARISON:  09/12/2013  FINDINGS: Cardiomegaly. Bibasilar atelectasis. Low volumes. No pneumothorax. Postoperative change.  IMPRESSION: Bibasilar atelectasis.   Electronically Signed   By: Maryclare Bean M.D.   On: 07/21/2014 12:39     EKG Interpretation   Date/Time:  Sunday July 21 2014 11:59:08 EDT Ventricular Rate:  66 PR Interval:  221 QRS Duration: 127 QT Interval:  435 QTC Calculation: 456 R Axis:   -44 Text Interpretation:  normal sinus with frequent PACs Nonspecific T wave  abnormality Borderline  prolonged PR interval Nonspecific IVCD with LAD  Anteroseptal infarct, old Nonspecific T abnormalities, lateral leads  Baseline wander in lead(s) V3 Reconfirmed by Wyvonnia Dusky  MD, Kayden Amend [phone])  on 07/21/2014 3:56:30 PM      MDM   Final diagnoses:  Syncope, unspecified syncope type  Aortic stenosis, moderate  Coronary artery disease involving autologous vein coronary bypass graft without angina pectoris  Bilateral carotid bruits  Essential hypertension, benign  Hyperlipidemia  Mantle cell lymphoma  Moderate aortic stenosis  Peripheral arterial disease  Thrombocytopenia  Hypersplenism   Patient from church with syncopal episode after taking nitroglycerin. Preceded by dizziness and lightheadedness. No chest pain. Questionable atrial fibrillation on initial EKG but appears to be PACs.  Feels back to baseline now. No focal neurodeficits. No dizziness. No chest pain or shortness of breath.  Chest x-ray negative. Labs appear to be at baseline.  Patient with multiple risk factors for syncope. he has aortic stenosis. D/w Dr. Orene Desanctis who will admit and monitor on telemetry.  BP 140/47  Pulse 113  Temp(Src) 98.2 F (36.8 C) (Oral)  Resp 20  SpO2 98%   Ezequiel Essex, MD 07/21/14 [phone]

## 2014-07-21 NOTE — ED Notes (Signed)
EMS-pt was at church when he suddenly started feeling dizzy. Patient took 1 nitro then had syncopal episode lasting approx 1 min. On ems arrival pt on afib, no hx of same. Pt denies any chest pain or any other symptomatic symptoms. No complaints at this time. Denies dizziness. 18(L)FA. CBG 142.

## 2014-07-21 NOTE — ED Notes (Signed)
Meal tray ordered 

## 2014-07-21 NOTE — Significant Event (Signed)
Cross Cover Note  Paged re rigors and fevers to 102.5. Patient seen and examined. Denies fevers, chills, sweats, dysuria, diarrhea, abd pain, rash, problems with LLE (where had has had all toes removed) prior to admission. Notes he has had a small cough over the past day.    On exam, mild distress, warm, flushed Tachy, regular, 3/6 systolic murmur Diffuse coarse BS No abd pain No edema Left foot stump is well healed without erythema, drainage, or tenderness  CXR from admission read as clear, could be a retrocardiac opacify?  Impression SIRS, most likely source is pulmonary  Plan - Blood and urine cultures - start broad spectrum abx (vanc/zosyn given how he looks) but may be able to narrow to CTX and azithro (for CAP coverage) if he more clearly declares himself as having PNA - Will hold off on repeating another CXR today, but may be worthwhile to repeat tomorrow to look for evolution if uncertainty remains - PRN tylenol

## 2014-07-21 NOTE — ED Notes (Signed)
Pt states that he was sitting in church  When he felt lightheaded pt took 1 nitro and then laid himself down in the pew and passed out for <31minute. Pt denies any pain

## 2014-07-22 ENCOUNTER — Inpatient Hospital Stay (HOSPITAL_COMMUNITY): Payer: Medicare Other

## 2014-07-22 DIAGNOSIS — I359 Nonrheumatic aortic valve disorder, unspecified: Secondary | ICD-10-CM

## 2014-07-22 DIAGNOSIS — R55 Syncope and collapse: Secondary | ICD-10-CM

## 2014-07-22 DIAGNOSIS — R509 Fever, unspecified: Secondary | ICD-10-CM

## 2014-07-22 DIAGNOSIS — I2510 Atherosclerotic heart disease of native coronary artery without angina pectoris: Secondary | ICD-10-CM

## 2014-07-22 LAB — CBC
HEMATOCRIT: 36.4 % — AB (ref 39.0–52.0)
Hemoglobin: 11.8 g/dL — ABNORMAL LOW (ref 13.0–17.0)
MCH: 31.1 pg (ref 26.0–34.0)
MCHC: 32.4 g/dL (ref 30.0–36.0)
MCV: 96 fL (ref 78.0–100.0)
Platelets: 75 10*3/uL — ABNORMAL LOW (ref 150–400)
RBC: 3.79 MIL/uL — ABNORMAL LOW (ref 4.22–5.81)
RDW: 15.9 % — AB (ref 11.5–15.5)
WBC: 3.5 10*3/uL — ABNORMAL LOW (ref 4.0–10.5)

## 2014-07-22 LAB — TROPONIN I
Troponin I: 0.39 ng/mL (ref <0.30)
Troponin I: <0.3 ng/mL (ref <0.30)
Troponin I: <0.3 ng/mL (ref <0.30)
Troponin I: <0.3 ng/mL (ref <0.30)

## 2014-07-22 LAB — GLUCOSE, CAPILLARY
GLUCOSE-CAPILLARY: 151 mg/dL — AB (ref 70–99)
GLUCOSE-CAPILLARY: 126 mg/dL — AB (ref 70–99)
Glucose-Capillary: 131 mg/dL — ABNORMAL HIGH (ref 70–99)
Glucose-Capillary: 119 mg/dL — ABNORMAL HIGH (ref 70–99)

## 2014-07-22 LAB — URINALYSIS, ROUTINE W REFLEX MICROSCOPIC
BILIRUBIN URINE: NEGATIVE
Glucose, UA: NEGATIVE mg/dL
HGB URINE DIPSTICK: NEGATIVE
KETONES UR: NEGATIVE mg/dL
LEUKOCYTES UA: NEGATIVE
Nitrite: NEGATIVE
PROTEIN: 30 mg/dL — AB
Specific Gravity, Urine: 1.017 (ref 1.005–1.030)
UROBILINOGEN UA: 0.2 mg/dL (ref 0.0–1.0)
pH: 6.5 (ref 5.0–8.0)

## 2014-07-22 LAB — URINE MICROSCOPIC-ADD ON

## 2014-07-22 LAB — HEPATIC FUNCTION PANEL
ALT: 11 U/L (ref 0–53)
AST: 17 U/L (ref 0–37)
Albumin: 3.5 g/dL (ref 3.5–5.2)
Alkaline Phosphatase: 95 U/L (ref 39–117)
BILIRUBIN TOTAL: 0.8 mg/dL (ref 0.3–1.2)
Bilirubin, Direct: 0.2 mg/dL (ref 0.0–0.3)
Indirect Bilirubin: 0.6 mg/dL (ref 0.3–0.9)
Total Protein: 5.7 g/dL — ABNORMAL LOW (ref 6.0–8.3)

## 2014-07-22 LAB — BASIC METABOLIC PANEL
Anion gap: 11 (ref 5–15)
BUN: 19 mg/dL (ref 6–23)
CALCIUM: 9.6 mg/dL (ref 8.4–10.5)
CO2: 27 mEq/L (ref 19–32)
Chloride: 97 mEq/L (ref 96–112)
Creatinine, Ser: 1.1 mg/dL (ref 0.50–1.35)
GFR, EST AFRICAN AMERICAN: 69 mL/min — AB (ref >90)
GFR, EST NON AFRICAN AMERICAN: 60 mL/min — AB (ref >90)
Glucose, Bld: 179 mg/dL — ABNORMAL HIGH (ref 70–99)
POTASSIUM: 4.6 meq/L (ref 3.7–5.3)
Sodium: 135 mEq/L — ABNORMAL LOW (ref 137–147)

## 2014-07-22 LAB — SEDIMENTATION RATE: Sed Rate: 16 mm/hr (ref 0–16)

## 2014-07-22 MED ORDER — INSULIN ASPART 100 UNIT/ML ~~LOC~~ SOLN
0.0000 [IU] | Freq: Three times a day (TID) | SUBCUTANEOUS | Status: DC
  Administered 2014-07-22 (×2): 1 [IU] via SUBCUTANEOUS
  Administered 2014-07-23: 2 [IU] via SUBCUTANEOUS

## 2014-07-22 MED ORDER — HEPARIN SODIUM (PORCINE) 5000 UNIT/ML IJ SOLN: 5000.0000 [IU] | Freq: Three times a day (TID) | INTRAMUSCULAR | Status: DC

## 2014-07-22 MED ORDER — HEPARIN SODIUM (PORCINE) 5000 UNIT/ML IJ SOLN
5000.0000 [IU] | Freq: Three times a day (TID) | INTRAMUSCULAR | Status: DC
  Administered 2014-07-22 – 2014-07-23 (×3): 5000 [IU] via SUBCUTANEOUS
  Filled 2014-07-22 (×6): qty 1

## 2014-07-22 NOTE — Progress Notes (Signed)
UR Completed Merline Perkin Graves-Bigelow, RN,BSN [phone]  

## 2014-07-22 NOTE — Consult Note (Addendum)
[facility] Medical Consultation  [name] YKD:[id number] DOB: Sep 05, 1927 DOA: 07/21/2014 PCP: Donnajean Lopes, MD   Requesting physician: Dr Irish Lack Date of consultation: 07/22/14 Reason for consultation: fever  Impression/Recommendations Principal Problem:   Fever, unspecified - I am unable to tell if the fever has recurred today as there are no vitals in the chart. Patient did not feel febrile when he had the temp yesterday and I am afraid that we will miss a fever if we are not checking vitals carefully.  - CXR negative for pneumonia, he has a chronic cough with is productive of white sputum and this is unchanged - UA negative for UTI - no signs of meningitis -no sinus pain or drainage, no ear ache  - no new rash or areas of cellulitis on exam - no c/o tooth ache to suggest dental abscess - 2 episodes of vomiting yesterday but no abd pain or diarrhea - WBC count not elevated - no RUQ tenderness- check LFTs for underlying cholecystitis - at this point, as we have no source, I would recommened he be monitored off of antibiotics- I have discontinued antibiotics - cont temp checks every 4 hrs for further fever- f/u on blood cultures- check ESR  Active Problems:    Syncope - undergoing work up by cardiology    DIABETES MELLITUS, TYPE II - cont ISS, agree with holding metformin    HYPERTENSION - Metoprolol on hold  [facility] will followup again tomorrow. Please contact me if I can be of assistance in the meanwhile. Thank you for this consultation.  Chief Complaint: admitted for syncope  HPI:  This is an 80y/o male with h/o HTN, DM 2, CAD who was admitted for syncope on 8/16 by the cardiology service. He had a fever of 102.5 noted at 6PM last night and an infectious work up including a CXR, blood cultures and a UA were ordered. The patient was other wise asymptomatic. He did admit to vomiting 2 x yesterday evening but had no abdominal pain or diarrhea. He  has a chronic cough which is productive of white sputum and this is unchanged from his baseline. He has no pain anywhere, no neck stiffness, no dysuria, no rash and no recent tick bites. He was started on Vanc and Zosyn last night.    Review of Systems  Constitutional: Negative for chills weight loss HENT: Negative for ear pain, nosebleeds, congestion, facial swelling, rhinorrhea, neck pain, neck stiffness and ear discharge.   Respiratory: Negative for cough, shortness of breath, wheezing  Cardiovascular: Negative for chest pain, palpitations and leg swelling.  Gastrointestinal: Negative for heartburn, abdominal pain, vomiting, diarrhea or consitpation Genitourinary: Negative for dysuria, urgency, frequency, hematuria Musculoskeletal: Negative for back pain or joint pain Neurological: Negative for dizziness, seizures, syncope, focal weakness,  numbness and headaches.  Hematological: Does not bruise/bleed easily.  Psychiatric/Behavioral: Negative for hallucinations, confusion, dysphoric mood   Past Medical History  Diagnosis Date  . CAD (coronary artery disease)     a. s/p CABG 1995;  b. NSTEMI & subsequent BMS to SVG->right PDA 02/03/12.;  c. Cath 02/14/2012 3vd with 4/4 patent grafts and patent stent in vg->pda.  . Carotid stenosis     a. Carotid dopplers 3/97: RICA 67-34%, LICA 1-93%;  b. dopplers 3/14:  79-02% RICA, 4-09% LICA  . HTN (hypertension)   . HLD (hyperlipidemia)   . DM2 (diabetes mellitus, type 2)   . DJD (degenerative joint disease)   . Asthmatic bronchitis   . BPH (benign  prostatic hypertrophy)   . Herpes zoster   . Anemia   . Thrombocytopenia     a. secondary to splenomegaly related to lymphoma with suspicion of bone marrow involvement. (Plavix had to be stopped due to this)  . Aortic stenosis, moderate     a. Echo 02/14/12 EF 55%, Gr 2 DD, Mod AS (mean grad: 29, peak grad 52)  . Splenomegaly 04/13/2012  . History of blood transfusion 04/13/2012  . Mantle cell lymphoma  of intra-abdominal lymph nodes 05/12/2012    Stage III s/p bendamustine, Rituxan therapy  . Dermatitis 06/07/2012    acnieform rash on back 06/06/12  . COPD (chronic obstructive pulmonary disease)   . Peripheral vascular disease     a. s/p L ray amp 10/13;  b. s/p L 2nd toe amp 12/13  . Pulmonary nodule     46m RUL calcified pulm nodule noted on CT staging for lymphoma - stable 10/2012.  . Leg DVT (deep venous thromboembolism), chronic     LE dopplers 5/13, 10/13 and 1/14: chronic DVT involving right mid femoral vein, left mid femoral vein, and left popliteal vein.  . Osteomyelitis     L first foot ray amputation 09/2012  . Gangrene     L 2nd toe amputation 11/2012   Past Surgical History  Procedure Laterality Date  . Coronary artery bypass graft  1995  . Rotator cuff repair    . Carpal tunnel release    . Incisional hernia repair    . Kidney surgery    . Back surgery    . I&d extremity  09/16/2012    Procedure: IRRIGATION AND DEBRIDEMENT EXTREMITY;  Surgeon: JWylene Simmer MD;  Location: [facility];  Service: Orthopedics;  Laterality: Left;  irrigation and debridement and first Ray amputation of left foot  . Amputation  09/16/2012    Procedure: AMPUTATION RAY;  Surgeon: JWylene Simmer MD;  Location: [facility];  Service: Orthopedics;  Laterality: Left;  first Ray amputation left foot  . Amputation  11/21/2012    Procedure: AMPUTATION RAY;  Surgeon: JWylene Simmer MD;  [facility] [facility]  Service: Orthopedics;  Laterality: Left;  LEFT SECOND TOE AMPUTATION  . I&d extremity Left 03/01/2013    Procedure: IRRIGATION AND DEBRIDEMENT EXTREMITY;  Surgeon: JWylene Simmer MD;  Location: [facility];  Service: Orthopedics;  Laterality: Left;  IRRIGATION  AND  DEBRIDEMENT  LEFT  FOOT  . Amputation Left 12/20/2013    Procedure: AMPUTATION LEFT 5TH TRANSMETATARSAL;  Surgeon: JWylene Simmer MD;  [place] [facility]  Service: Orthopedics;  Laterality: Left;  . Achilles tendon lengthening Left 12/20/2013    Procedure: ACHILLES  TENDON LENGTHENING;  Surgeon: JWylene Simmer MD;  [place] [facility]  Service: Orthopedics;  Laterality: Left;   Social History:  reports that he quit smoking about 29 years ago. His smoking use included Cigarettes and Pipe. He has a 30 pack-year smoking history. He has never used smokeless tobacco. He reports that he does not drink alcohol or use illicit drugs.  Allergies  Allergen Reactions  . Losartan     Hyperkalemia > 7  . Azithromycin Itching  . Doxycycline Itching       . Keflex [Cephalexin] Itching  . Macrodantin Itching  . Minocycline Hcl Itching  . Nitrofurantoin Itching  . Zithromax [Azithromycin Dihydrate] Itching  . Contrast Media [Iodinated Diagnostic Agents] Rash   Family History  Problem Relation Age of Onset  . Coronary artery disease    . Alcohol abuse  Prior to Admission medications   Medication Sig Start Date End Date Taking? Authorizing Provider  aspirin EC 81 MG tablet Take 81 mg by mouth every morning.    Yes Historical Provider, MD  gabapentin (NEURONTIN) 300 MG capsule Take 300-600 mg by mouth 3 (three) times daily. Take 600 mg in the morning, 600 mg at midday, and 300 mg at bedtime. 03/03/13  Yes Haywood Pao, MD  isosorbide mononitrate (IMDUR) 30 MG 24 hr tablet Take 30 mg by mouth daily.   Yes Historical Provider, MD  metFORMIN (GLUCOPHAGE) 500 MG tablet Take 500 mg by mouth 2 (two) times daily with a meal.    Yes Historical Provider, MD  metoprolol tartrate (LOPRESSOR) 25 MG tablet Take 12.5 mg by mouth 2 (two) times daily.   Yes Historical Provider, MD  pravastatin (PRAVACHOL) 40 MG tablet Take 40 mg by mouth daily.   Yes Historical Provider, MD  vitamin B-12 (CYANOCOBALAMIN) 500 MCG tablet Take 1,000 mcg by mouth daily.    Yes Historical Provider, MD    Physical Exam: Blood pressure 136/60, pulse 82, temperature 98.9 F (37.2 C), temperature source Oral, resp. rate 20, height 6' 0.05" (1.83 m), weight 82.283 kg (181 lb 6.4 oz), SpO2  97.00%. @VITALS2 @ [employer]   07/22/14 0500  Weight: 82.283 kg (181 lb 6.4 oz)    Intake/Output Summary (Last 24 hours) at 07/22/14 1619 Last data filed at 07/22/14 1202  Gross per 24 hour  Intake    390 ml  Output   1275 ml  Net   -885 ml     Constitutional: Appears well-developed and well-nourished. No distress. HENT: Normocephalic. External right and left ear normal. Oropharynx is clear and moist.  Eyes: Conjunctivae and EOM are normal. PERRLA, no scleral icterus.  Neck: Normal ROM. Neck supple. No JVD. No tracheal deviation. No thyromegaly.  CVS: RRR, S1/S2 +, no murmurs, no gallops, no carotid bruit.  Pulmonary: Effort and breath sounds normal, no stridor, rhonchi, wheezes, rales.  Abdominal: Soft. BS +,  no distension, tenderness, rebound or guarding.  Musculoskeletal: Normal range of motion. No edema and no tenderness.  Neuro: Alert. Normal reflexes, muscle tone coordination. No cranial nerve deficit. Skin: Skin is warm and dry. No rash noted. Not diaphoretic. No erythema. No pallor.  Psychiatric: Normal mood and affect. Behavior, judgment, thought content normal.    Labs on Admission:  Basic Metabolic Panel:  Recent Labs Lab 07/21/14 1207 07/22/14 0930  NA 137 135*  K 4.2 4.6  CL 100 97  CO2 25 27  GLUCOSE 143* 179*  BUN 23 19  CREATININE 1.01 1.10  CALCIUM 9.8 9.6   Liver Function Tests: No results found for this basename: AST, ALT, ALKPHOS, BILITOT, PROT, ALBUMIN,  in the last 168 hours No results found for this basename: LIPASE, AMYLASE,  in the last 168 hours No results found for this basename: AMMONIA,  in the last 168 hours CBC:  Recent Labs Lab 07/21/14 1207 07/22/14 0930  WBC 4.1 3.5*  HGB 12.0* 11.8*  HCT 36.0* 36.4*  MCV 97.3 96.0  PLT 88* 75*   Cardiac Enzymes:  Recent Labs Lab 07/21/14 1620 07/21/14 2115 07/22/14 0317 07/22/14 0930 07/22/14 1430  TROPONINI <0.30 <0.30 0.39* <0.30 <0.30   BNP: No components found with  this basename: POCBNP,  CBG:  Recent Labs Lab 07/21/14 1806 07/21/14 2111 07/22/14 0727 07/22/14 1144  GLUCAP 174* 231* 151* 131*    Radiological Exams on Admission: Ct Head Wo Contrast  07/21/2014   CLINICAL DATA:  Syncopal episode.  EXAM: CT HEAD WITHOUT CONTRAST  TECHNIQUE: Contiguous axial images were obtained from the base of the skull through the vertex without intravenous contrast.  COMPARISON:  Brain MRI, 09/26/2010.  FINDINGS: Ventricles are normal configuration. There is ventricular and sulcal enlargement reflecting age related volume loss. There are no parenchymal masses or mass effect. There is no evidence of an infarct. There are no extra-axial masses or abnormal fluid collections.  No intracranial hemorrhage.  Minor ethmoid sinus mucosal thickening. Remaining visualized sinuses and mastoid air cells are clear. No skull lesion.  IMPRESSION: 1. No acute intracranial abnormalities. 2. Age related volume loss.  Minor ethmoid sinus mucosal thickening.   Electronically Signed   By: Lajean Manes M.D.   On: 07/21/2014 13:26   Dg Chest Port 1 View  07/22/2014   CLINICAL DATA:  Fever, vomiting and cough.  EXAM: PORTABLE CHEST - 1 VIEW  COMPARISON:  07/21/2014.  FINDINGS: Trachea is midline. Heart is enlarged, stable. Lungs are clear. No pleural fluid.  IMPRESSION: No acute findings.   Electronically Signed   By: Lorin Picket M.D.   On: 07/22/2014 10:04   Dg Chest Port 1 View  07/21/2014   CLINICAL DATA:  Altered mental status  EXAM: PORTABLE CHEST - 1 VIEW  COMPARISON:  09/12/2013  FINDINGS: Cardiomegaly. Bibasilar atelectasis. Low volumes. No pneumothorax. Postoperative change.  IMPRESSION: Bibasilar atelectasis.   Electronically Signed   By: Maryclare Bean M.D.   On: 07/21/2014 12:39    Time spent: 48 min  [place], MD [facility] To page rounding or on call physician  [email or web address] 07/22/2014, 4:19 PM

## 2014-07-22 NOTE — Progress Notes (Signed)
Patient Name: [name] Date of Encounter: 07/22/2014     Active Problems:   Syncope    SUBJECTIVE  Feeling much better today. No further chills. Still with cough- white sputum.   CURRENT MEDS . acetaminophen  650 mg Rectal Once  . aspirin EC  81 mg Oral [id number]  . cyanocobalamin  1,000 mcg Oral Daily  . gabapentin  300 mg Oral QHS  . gabapentin  600 mg Oral BID AC  . isosorbide mononitrate  30 mg Oral Daily  . metFORMIN  500 mg Oral BID WC  . piperacillin-tazobactam (ZOSYN)  IV  3.375 g Intravenous 3 times per day  . simvastatin  20 mg Oral q1800  . vancomycin  750 mg Intravenous Q12H    OBJECTIVE  Filed Vitals:   07/21/14 1523 07/21/14 1809 07/21/14 2100 07/22/14 0500  BP: 140/47 155/75 111/62 136/60  Pulse:  115 117 82  Temp:  102.5 F (39.2 C) 100.2 F (37.9 C) 98.9 F (37.2 C)  TempSrc:      Resp: 20 24 21 20   Weight:    181 lb 6.4 oz (82.283 kg)  SpO2:   97% 97%    Intake/Output Summary (Last 24 hours) at 07/22/14 0745 Last data filed at 07/22/14 0500  Gross per 24 hour  Intake    150 ml  Output    975 ml  Net   -825 ml   Filed Weights   07/22/14 0500  Weight: 181 lb 6.4 oz (82.283 kg)    PHYSICAL EXAM  General: Pleasant, NAD. Neuro: Alert and oriented X 3. Moves all extremities spontaneously. Psych: Normal affect. HEENT:  Normal  Neck: Supple without bruits or JVD. Lungs:  Resp regular and unlabored, rhodochrous. cough Heart: RRR no s3, s4, or murmurs. Abdomen: Soft, non-tender, non-distended, BS + x 4.  Extremities: No clubbing, cyanosis or edema. DP/PT/Radials 2+ and equal bilaterally.  Accessory Clinical Findings  CBC  Recent Labs  07/21/14 1207  WBC 4.1  HGB 12.0*  HCT 36.0*  MCV 97.3  PLT 88*   Basic Metabolic Panel  Recent Labs  07/21/14 1207  NA 137  K 4.2  CL 100  CO2 25  GLUCOSE 143*  BUN 23  CREATININE 1.01  CALCIUM 9.8   Cardiac Enzymes  Recent Labs  07/21/14 1620 07/21/14 2115 07/22/14 0317    TROPONINI <0.30 <0.30 0.39*    TELE  NSR with freq PACs  Radiology/Studies  Ct Head Wo Contrast  07/21/2014   CLINICAL DATA:  Syncopal episode.  EXAM: CT HEAD WITHOUT CONTRAST  TECHNIQUE: Contiguous axial images were obtained from the base of the skull through the vertex without intravenous contrast.  COMPARISON:  Brain MRI, 09/26/2010.  FINDINGS: Ventricles are normal configuration. There is ventricular and sulcal enlargement reflecting age related volume loss. There are no parenchymal masses or mass effect. There is no evidence of an infarct. There are no extra-axial masses or abnormal fluid collections.  No intracranial hemorrhage.  Minor ethmoid sinus mucosal thickening. Remaining visualized sinuses and mastoid air cells are clear. No skull lesion.  IMPRESSION: 1. No acute intracranial abnormalities. 2. Age related volume loss.  Minor ethmoid sinus mucosal thickening.     Dg Chest Port 1 View  07/21/2014   CLINICAL DATA:  Altered mental status  EXAM: PORTABLE CHEST - 1 VIEW  COMPARISON:  09/12/2013  FINDINGS: Cardiomegaly. Bibasilar atelectasis. Low volumes. No pneumothorax. Postoperative change.  IMPRESSION: Bibasilar atelectasis.      ASSESSMENT AND PLAN  [name] is a 80 y.o. male with a history of CAD (CABG 1995 BMS to SVG-PDA 2013), ischemic cardiomyopathy (EF dropped from 50% to 35-40% by echo 02/2013), moderate AS (mean grad 35, AVA 1 cmsq), chronic nonspecific IVCD, PAD, DM, chronicDVT (right mid femoral vein, left mid femoral vein, and left popliteal vein), mantle cell lymphoma (splenomegaly with thrombocytopenia, so taken off plavix) who presented to [facility] on 07/21/14 with syncope after an episode of CP during church and 1 SL NTG.  Syncope - nonexertional, during church. May have been NTG-related (especially if AS is severe), but initial symptoms were thought to be arrhythmic per Dr. Sallyanne Kuster -- BB held  SIRS, most likely source is pulmonary. Fellow paged about rigors and  fevers to 102.5 -- CXR from admission read as clear, could be a retrocardiac opacify? I have ordered a portable CXR. -- Started on broad spectrum abx (vanc/zosyn given how he looks). Blood and urine cultures pending. Possible aspiration during syncope? I talked to IM who will see to help narrow his ABX. -- Feeling much better currently.  CAD s/p CABG and PCI to SVG - angina free currently. -- Third troponin slightly elevated at 0.39. I have ordered another set of enzymes.  -- 2013 cath with   1. Severe three-vessel native coronary artery disease. LM is now occluded. There was scant flow through this previously.   2. Wide patency of the LIMA to LAD and saphenous vein graft sequenced to obtuse marginal branches left circumflex. Widely patent recent stent in the SVG to the RCA. He was continued on medical therapy at that time. - -- Will not start full dose heparin at this time, but will start DVT prophylaxis with heparin.  -- Hold metformin in the case that he needs a cath at some point. Will start SSI  AS - at least moderate. Dr. Burt Knack was planning a f/u echo in a few weeks time  -- Repeat 2D ECHO today.   IVCD - increases likelihood of bradyarrhythmic syncope per Dr. Loletha Grayer.  Moderate (ischemic?) cardiomyopathy - raises concern for ventricular arrhythmia  Chronic DVT (right mid femoral vein, left mid femoral vein, and left popliteal vein)- started DVT prophylaxis    Signed, Perry Mount PA-C  Pager [phone]  I have examined the patient and reviewed assessment and plan and discussed with patient.  Agree with above as stated.  Start DVT prophylaxis.  Unclear what the cause of his fever last night was.  IM consult to narrow antibiotics.  Cultures pending.  No angina at this time.  WOuld not plan any invasive cardiac testing at this time.  Echo today to check AS- especially in the setting of syncope.  Syncope: no VT noted.  If EF is low, would have to consider EP eval.    [name]  S.

## 2014-07-22 NOTE — Progress Notes (Signed)
Echo Lab  2D Echocardiogram completed.  Prichard, RDCS 07/22/2014 10:54 AM

## 2014-07-22 NOTE — Care Management Note (Unsigned)
    Page 1 of 1   07/22/2014     4:18:03 PM CARE MANAGEMENT NOTE 07/22/2014  Patient:  [name], [name]   Account Number:  [id number]  Date Initiated:  07/22/2014  Documentation initiated by:  GRAVES-BIGELOW,Britton Perkinson  Subjective/Objective Assessment:   Pt admitted for syncope. Pt with increased temp. Initiated on IV vancomycin and Zosyn for SIRS. Pt is from home with wife.     Action/Plan:   CM will continue to monitor for dispostion needs.   Anticipated DC Date:  07/24/2014   Anticipated DC Plan:  [place]  CM consult      Choice offered to / List presented to:             Status of service:  In process, will continue to follow Medicare Important Message given?   (If response is "NO", the following Medicare IM given date fields will be blank) Date Medicare IM given:   Medicare IM given by:   Date Additional Medicare IM given:   Additional Medicare IM given by:    Discharge Disposition:    Per UR Regulation:  Reviewed for med. necessity/level of care/duration of stay  If discussed at [place] of Stay Meetings, dates discussed:    Comments:

## 2014-07-23 ENCOUNTER — Encounter (HOSPITAL_COMMUNITY): Payer: Self-pay | Admitting: Physician Assistant

## 2014-07-23 DIAGNOSIS — Z9289 Personal history of other medical treatment: Secondary | ICD-10-CM | POA: Insufficient documentation

## 2014-07-23 DIAGNOSIS — I82509 Chronic embolism and thrombosis of unspecified deep veins of unspecified lower extremity: Secondary | ICD-10-CM | POA: Diagnosis present

## 2014-07-23 LAB — BASIC METABOLIC PANEL
Anion gap: 13 (ref 5–15)
BUN: 18 mg/dL (ref 6–23)
CALCIUM: 9.6 mg/dL (ref 8.4–10.5)
CO2: 25 mEq/L (ref 19–32)
CREATININE: 0.99 mg/dL (ref 0.50–1.35)
Chloride: 99 mEq/L (ref 96–112)
GFR calc Af Amer: 85 mL/min — ABNORMAL LOW (ref >90)
GFR, EST NON AFRICAN AMERICAN: 73 mL/min — AB (ref >90)
GLUCOSE: 137 mg/dL — AB (ref 70–99)
Potassium: 4.1 mEq/L (ref 3.7–5.3)
SODIUM: 137 meq/L (ref 137–147)

## 2014-07-23 LAB — CBC
HCT: 34.4 % — ABNORMAL LOW (ref 39.0–52.0)
HEMOGLOBIN: 11.6 g/dL — AB (ref 13.0–17.0)
MCH: 32 pg (ref 26.0–34.0)
MCHC: 33.7 g/dL (ref 30.0–36.0)
MCV: 95 fL (ref 78.0–100.0)
Platelets: 80 10*3/uL — ABNORMAL LOW (ref 150–400)
RBC: 3.62 MIL/uL — ABNORMAL LOW (ref 4.22–5.81)
RDW: 15.8 % — ABNORMAL HIGH (ref 11.5–15.5)
WBC: 3.5 10*3/uL — ABNORMAL LOW (ref 4.0–10.5)

## 2014-07-23 LAB — URINE CULTURE
COLONY COUNT: NO GROWTH
CULTURE: NO GROWTH

## 2014-07-23 LAB — GLUCOSE, CAPILLARY: Glucose-Capillary: 129 mg/dL — ABNORMAL HIGH (ref 70–99)

## 2014-07-23 NOTE — Discharge Summary (Signed)
Discharge Summary   Patient ID: [name] MRN: [id number], DOB/AGE: 78-Dec-1928 78 y.o. Admit date: 07/21/2014 D/C date:     07/23/2014  Primary Cardiologist: Dr. Burt Knack    Principal Problem:   Syncope Active Problems:   DIABETES MELLITUS, TYPE II   HYPERTENSION   CEREBROVASCULAR DISEASE   Thrombocytopenia   BPH (benign prostatic hyperplasia)   Lymphoma   Peripheral arterial disease   Mantle cell lymphoma   Carotid artery disease   Fever, unspecified   Aortic stenosis, severe   Leg DVT (deep venous thromboembolism), chronic   Admission Dates: 07/21/14- 07/23/14 Discharge Diagnosis: Syncope- thought to be related to severe AS and NTG use.  HPI: [name] is a 80 y.o. male with a history of CAD (CABG 1995 BMS to SVG-PDA 2013), ischemic cardiomyopathy (EF dropped from 50% to 35-40% by echo 02/2013- now normalized), moderate AS (mean grad 64, AVA 1 cmsq), chronic nonspecific IVCD, PAD, DM, chronicDVT (right mid femoral vein, left mid femoral vein, and left popliteal vein), mantle cell lymphoma (splenomegaly with thrombocytopenia, so taken off plavix) who presented to [facility] on 07/21/14 with syncope after an episode of CP during church and 1 SL NTG.   Hospital Course  Syncope - nonexertional, during church. Though to be NTG-related in the setting of severe AS.  -- BB held   AS -severe. 2D ECHO yesterday with severe aortic stenosis. ( see report above)  -- Compared to the prior echo in 02/2014, the EF has normalized. The aortic valve gradient has increased as well and the mean gradient is around 40 mmHg. This suggests "true" aortic stenosis, which is severe - AVA of 0.8-0.9 cm2.  -- Follow up with Dr. Burt Knack as an outpatient.   Fever- The patient had an episode of fevers, chills, cough and vomiting during his admission. This resolved quickly and he is now doing well. IM was consulted and no source of infection found. His fever resolved and the antibiotics were discontinued.    CAD s/p CABG and PCI to SVG. The patient had one episode of chest pain during church the morning of admission. He took a NTG and then had an episode of syncope. NTG in the setting of severe AS is thought to be the etiology of his syncope. He had no further chest pain during his admission. His troponin was negative initially; however, his third troponin returned slightly elevated at 0.39. Another set of enzymes was ordered which returned normal. -- 2013 cath with  1. Severe three-vessel native coronary artery disease. LM occluded. There was scant flow through this previously.  2. Wide patency of the LIMA to LAD and saphenous vein graft sequenced to obtuse marginal branches left circumflex. Widely patent recent stent in the SVG to the RCA. He was continued on medical therapy at that time.  -- Continue on medical therapy with ASA and statin. BB held due to syncope.   Moderate cardiomyopathy - now resolved by ECHO on this admission.  -- EF was noted to drop from 50% to 35-40% by echo 02/2014- now normalized ( 55-60%). Dr. Burt Knack had scheduled a follow up ECHO in 08/2014 which I have cancelled since he had one done during his admission.   Chronic DVT (right mid femoral vein, left mid femoral vein, and left popliteal vein) -- He was placed on heparin for DVT prophylaxis during his admission  DM- Resume metformin.   The patient has had an uncomplicated hospital course and is recovering well. He has been  seen by Dr. Irish Lack today and deemed ready for discharge home. All follow-up appointments have been scheduled. Discharge medications are listed below. His BB will be held at discharge.   Discharge Vitals: Blood pressure 105/47, pulse 63, temperature 98.2 F (36.8 C), temperature source Oral, resp. rate 18, height 6' 0.05" (1.83 m), weight 182 lb 15.7 oz (83 kg), SpO2 96.00%.  Labs: Lab Results  Component Value Date   WBC 3.5* 07/23/2014   HGB 11.6* 07/23/2014   HCT 34.4* 07/23/2014   MCV 95.0  07/23/2014   PLT 80* 07/23/2014     Recent Labs Lab 07/22/14 1715 07/23/14 0430  NA  --  137  K  --  4.1  CL  --  99  CO2  --  25  BUN  --  18  CREATININE  --  0.99  CALCIUM  --  9.6  PROT 5.7*  --   BILITOT 0.8  --   ALKPHOS 95  --   ALT 11  --   AST 17  --   GLUCOSE  --  137*    Recent Labs  07/22/14 0317 07/22/14 0930 07/22/14 1430 07/22/14 2051  TROPONINI 0.39* <0.30 <0.30 <0.30     Diagnostic Studies/Procedures   Ct Head Wo Contrast  07/21/2014   CLINICAL DATA:  Syncopal episode.  EXAM: CT HEAD WITHOUT CONTRAST  TECHNIQUE: Contiguous axial images were obtained from the base of the skull through the vertex without intravenous contrast.  COMPARISON:  Brain MRI, 09/26/2010.  FINDINGS: Ventricles are normal configuration. There is ventricular and sulcal enlargement reflecting age related volume loss. There are no parenchymal masses or mass effect. There is no evidence of an infarct. There are no extra-axial masses or abnormal fluid collections.  No intracranial hemorrhage.  Minor ethmoid sinus mucosal thickening. Remaining visualized sinuses and mastoid air cells are clear. No skull lesion.  IMPRESSION: 1. No acute intracranial abnormalities. 2. Age related volume loss.  Minor ethmoid sinus mucosal thickening.    Dg Chest Port 1 View  07/22/2014   CLINICAL DATA:  Fever, vomiting and cough.  EXAM: PORTABLE CHEST - 1 VIEW  COMPARISON:  07/21/2014.  FINDINGS: Trachea is midline. Heart is enlarged, stable. Lungs are clear. No pleural fluid.  IMPRESSION: No acute findings.    Dg Chest Port 1 View  07/21/2014   CLINICAL DATA:  Altered mental status  EXAM: PORTABLE CHEST - 1 VIEW  COMPARISON:  09/12/2013  FINDINGS: Cardiomegaly. Bibasilar atelectasis. Low volumes. No pneumothorax. Postoperative change.  IMPRESSION: Bibasilar atelectasis.   2D ECHO: 07/22/2014 LV EF: 55% - 60% Study Conclusions - Left ventricle: The cavity size was normal. Wall thickness was increased in a  pattern of mild LVH. Systolic function was normal. The estimated ejection fraction was in the range of 55% to 60%. Images were inadequate for LV wall motion assessment. Doppler parameters are consistent with abnormal left ventricular relaxation (grade 1 diastolic dysfunction). The E/e&' ratio is between 8-15, suggesting indeterminate LV filling pressure. - Aortic valve: Moderately calcified leafelts with reduced excursion. There is severe aortic stenosis. Peak and mean gradients of 57 mmHg and 42 mmHg, respectively. Based on an LVOT diameter of 2.0 cm, the calculated AVA is 0.8-0.9 cm2. There was trivial regurgitation. Valve area (VTI): 0.84 cm^2. Valve area (Vmax): 0.86 cm^2. - Aorta: Aortic root dimension: 42 mm (ED). - Aortic root: The aortic root is mildly dilated. - Mitral valve: Moderate posterior MAC. There was mild regurgitation. - Left atrium: Severely dilated (37 cm2). -  Right atrium: Severely dilated. - Tricuspid valve: There was mild regurgitation. - Pulmonary arteries: PA peak pressure: 30 mm Hg (S). - Inferior vena cava: The vessel was normal in size. The respirophasic diameter changes were in the normal range (= 50%), consistent with normal central venous pressure. Impressions: - Compared to the prior echo in 3.2015, the EF has normalized. The aortic valve gradient has increased as well and the mean gradient is around 40 mmHg. This suggests "true" aortic stenosis, which is severe - AVA of 0.8-0.9 cm2.     Discharge Medications     Medication List    STOP taking these medications       metoprolol tartrate 25 MG tablet  Commonly known as:  LOPRESSOR      TAKE these medications       aspirin EC 81 MG tablet  Take 81 mg by mouth every morning.     gabapentin 300 MG capsule  Commonly known as:  NEURONTIN  Take 300-600 mg by mouth 3 (three) times daily. Take 600 mg in the morning, 600 mg at midday, and 300 mg at bedtime.     isosorbide mononitrate 30 MG 24  hr tablet  Commonly known as:  IMDUR  Take 30 mg by mouth daily.     metFORMIN 500 MG tablet  Commonly known as:  GLUCOPHAGE  Take 500 mg by mouth 2 (two) times daily with a meal.     pravastatin 40 MG tablet  Commonly known as:  PRAVACHOL  Take 40 mg by mouth daily.     vitamin B-12 500 MCG tablet  Commonly known as:  CYANOCOBALAMIN  Take 1,000 mcg by mouth daily.        Disposition   The patient will be discharged in stable condition to home.  Follow-up Information   Follow up with Richardson Dopp, PA-C On 08/09/2014. (@ 11am ( Dr. Burt Knack is in the office this day))    Specialty:  Physician Assistant   Contact information:   [address] [address] [phone]         Duration of Discharge Encounter: Greater than 30 minutes including physician and PA time.  SignedVertell Limber, KATHRYN PA-C 07/23/2014, 10:22 AM   I have examined the patient and reviewed assessment and plan and discussed with patient.  Agree with above as stated.  Plan for TAVR evaluation with Dr. Burt Knack as an outpatient.  Watch for recurrent fever.  VARANASI,JAYADEEP S.

## 2014-07-23 NOTE — Discharge Instructions (Signed)
Aortic Stenosis  Aortic stenosis is a narrowing of the aortic valve. The aortic valve is a gatelike structure that is located between the lower left chamber of the heart (left ventricle) and the blood vessel that leads away from the heart (aorta). When the aortic valve is narrowed, it does not open all the way. This makes it hard for the heart to pump blood into the aorta and causes the heart to work harder. The extra work can weaken the heart over time and lead to heart failure.  CAUSES   Causes of aortic stenosis include:  · Calcium deposits on the aortic valve that have made the valve stiff. This condition generally affects those over the age of 65. It is the most common cause of aortic stenosis.  · A birth defect.   · Rheumatic fever. This is a problem that may occur after a strep throat infection that was not treated adequately. Rheumatic fever can cause permanent damage to heart valves.  SIGNS AND SYMPTOMS   People with aortic stenosis usually have no symptoms until the condition becomes severe. It may take 10-20 years for mild or moderate aortic stenosis to become severe. Symptoms may include:   · Shortness of breath, especially with physical activity.    · Feeling weak and tired (fatigued) or getting tired easily.  · Chest discomfort (angina). This may occur with minimal activity if the aortic stenosis is severe.   · An irregular or faster-than-normal heartbeat.  · Dizziness or fainting that happens with exertion or after taking certain heart medicines (such as nitroglycerin).  DIAGNOSIS   Aortic stenosis is usually diagnosed with a physical exam and with a type of imaging test called echocardiography. During echocardiography, sound waves are used to evaluate how blood flows through the heart. If your health care provider suspects aortic stenosis but the test does not clearly show it, a procedure called cardiac catheterization may be done to diagnose the condition. Tests may also be done to evaluate heart  function. They may include:  · Electrocardiography. During this test, the electrical impulses of the heart are recorded while you are lying down and sticky patches are placed on your chest, arms, and legs.  · Stress tests. There is more than one type of stress test. If a stress test is needed, ask your health care provider about which type is best for you.  · Blood tests.  TREATMENT   Treatment depends on how severe the aortic stenosis is, your symptoms, and the problems it is causing.   · Observation. If the aortic stenosis is mild, no treatment may be needed. However, you will need to have the condition checked regularly to make sure it is not getting worse or causing serious problems.  · Surgery. Surgery to repair or replace the aortic valve is the most common treatment for aortic stenosis. Several types of surgeries are available. The most common are open-heart surgery and transcutaneous aortic valve replacement (TAVR). TAVR does not require that the chest be opened. It is usually performed on elderly patients and those who are not able to have open-heart surgery.  · Medicines. Medicines may be given to keep symptoms from getting worse. Medicines cannot reverse aortic stenosis.  HOME CARE INSTRUCTIONS   · You may need to avoid certain types of physical activity. If your aortic stenosis is mild, you may need to avoid only strenuous activity. The more severe your aortic stenosis, the more activities you will need to avoid. Talk with your health care provider about   the types of activity you should avoid.  · Take medicines only as directed by your health care provider.  · If you are a woman with aortic valve stenosis and want to get pregnant, talk to your health care provider before you become pregnant.  · If you are a woman with aortic valve stenosis and are pregnant, keep all follow-up visits with all recommended health care providers.  · Keep all follow-up visits for tests, exams, and treatments as directed by  your health care provider.  SEEK IMMEDIATE MEDICAL CARE IF:  · You develop chest pain or tightness.    · You develop shortness of breath or difficulty breathing.    · You develop light-headedness or faint.    · It feels like your heartbeat is irregular or faster than normal.  · You have a fever.  Document Released: 08/21/2003 Document Revised: 04/08/2014 Document Reviewed: 11/16/2012  Exit[employer]® Patient Information ©2015 [employer]. This information is not intended to replace advice given to you by your health care provider. Make sure you discuss any questions you have with your health care provider.

## 2014-07-23 NOTE — Progress Notes (Signed)
No fevers recorded overnight. No new symptoms noted either.   Discussed plan with Cardio PA and with patient and wife who is in the room. He has no source of infection found as of yet. He is advised to check Temp every 4-hrs at home for recurrence and also advised to monitor for symptoms of infection- cough, diarrhea, dysuria etc. If fever or symptoms noted, he can get in touch with his PCP for further work up and management.  Patient and his wife agree with this plan.  I think Cardiology plans on discharging him today. I will therefore sign off. Please call for further assistance if needed.  Thank you.    Debbe Odea, MD [facility].

## 2014-07-23 NOTE — Progress Notes (Signed)
Patient Name: [name] Date of Encounter: 07/23/2014     Principal Problem:   Fever, unspecified Active Problems:   DIABETES MELLITUS, TYPE II   HYPERTENSION   Syncope    SUBJECTIVE  No Cp or SOB. Wants to shower. Wants to go home.   CURRENT MEDS . acetaminophen  650 mg Rectal Once  . aspirin EC  81 mg Oral [id number]  . cyanocobalamin  1,000 mcg Oral Daily  . gabapentin  300 mg Oral QHS  . gabapentin  600 mg Oral BID AC  . heparin  5,000 Units Subcutaneous 3 times per day  . insulin aspart  0-15 Units Subcutaneous TID WC  . isosorbide mononitrate  30 mg Oral Daily  . simvastatin  20 mg Oral q1800    OBJECTIVE  Filed Vitals:   07/22/14 1800 07/22/14 2000 07/22/14 2359 07/23/14 0500  BP: 135/63 120/51 142/59 121/47  Pulse: 63 98 74 67  Temp: 99 F (37.2 C) 98.9 F (37.2 C) 98.8 F (37.1 C) 98.4 F (36.9 C)  TempSrc: Oral Oral Oral Oral  Resp: 18 18 18 18   Height:      Weight:    182 lb 15.7 oz (83 kg)  SpO2: 96% 96% 96% 95%    Intake/Output Summary (Last 24 hours) at 07/23/14 0756 Last data filed at 07/23/14 0502  Gross per 24 hour  Intake    890 ml  Output   2275 ml  Net  -1385 ml   Filed Weights   07/22/14 0500 07/23/14 0500  Weight: 181 lb 6.4 oz (82.283 kg) 182 lb 15.7 oz (83 kg)    PHYSICAL EXAM  General: Pleasant, NAD. Neuro: Alert and oriented X 3. Moves all extremities spontaneously. Psych: Normal affect. HEENT:  Normal  Neck: Supple without bruits or JVD. Lungs:  Resp regular and unlabored, rhodochrous. cough Heart: RRR no s3, s4, + SEM murmurs. Abdomen: Soft, non-tender, non-distended, BS + x 4.  Extremities: No clubbing, cyanosis or edema. DP/PT/Radials 2+ and equal bilaterally.  Accessory Clinical Findings  CBC  Recent Labs  07/22/14 0930 07/23/14 0430  WBC 3.5* 3.5*  HGB 11.8* 11.6*  HCT 36.4* 34.4*  MCV 96.0 95.0  PLT 75* 80*   Basic Metabolic Panel  Recent Labs  07/22/14 0930 07/23/14 0430  NA 135* 137  K  4.6 4.1  CL 97 99  CO2 27 25  GLUCOSE 179* 137*  BUN 19 18  CREATININE 1.10 0.99  CALCIUM 9.6 9.6   Cardiac Enzymes  Recent Labs  07/22/14 0930 07/22/14 1430 07/22/14 2051  TROPONINI <0.30 <0.30 <0.30    TELE  NSR with freq PACs  Radiology/Studies  Ct Head Wo Contrast  07/21/2014   CLINICAL DATA:  Syncopal episode.  EXAM: CT HEAD WITHOUT CONTRAST  TECHNIQUE: Contiguous axial images were obtained from the base of the skull through the vertex without intravenous contrast.  COMPARISON:  Brain MRI, 09/26/2010.  FINDINGS: Ventricles are normal configuration. There is ventricular and sulcal enlargement reflecting age related volume loss. There are no parenchymal masses or mass effect. There is no evidence of an infarct. There are no extra-axial masses or abnormal fluid collections.  No intracranial hemorrhage.  Minor ethmoid sinus mucosal thickening. Remaining visualized sinuses and mastoid air cells are clear. No skull lesion.  IMPRESSION: 1. No acute intracranial abnormalities. 2. Age related volume loss.  Minor ethmoid sinus mucosal thickening.     Dg Chest Port 1 View  07/21/2014   CLINICAL DATA:  Altered  mental status  EXAM: PORTABLE CHEST - 1 VIEW  COMPARISON:  09/12/2013  FINDINGS: Cardiomegaly. Bibasilar atelectasis. Low volumes. No pneumothorax. Postoperative change.  IMPRESSION: Bibasilar atelectasis.       2D ECHO: 07/22/2014 LV EF: 55% - 60% Study Conclusions - Left ventricle: The cavity size was normal. Wall thickness was increased in a pattern of mild LVH. Systolic function was normal. The estimated ejection fraction was in the range of 55% to 60%. Images were inadequate for LV wall motion assessment. Doppler parameters are consistent with abnormal left ventricular relaxation (grade 1 diastolic dysfunction). The E/e&' ratio is between 8-15, suggesting indeterminate LV filling pressure. - Aortic valve: Moderately calcified leafelts with reduced excursion. There is  severe aortic stenosis. Peak and mean gradients of 57 mmHg and 42 mmHg, respectively. Based on an LVOT diameter of 2.0 cm, the calculated AVA is 0.8-0.9 cm2. There was trivial regurgitation. Valve area (VTI): 0.84 cm^2. Valve area (Vmax): 0.86 cm^2. - Aorta: Aortic root dimension: 42 mm (ED). - Aortic root: The aortic root is mildly dilated. - Mitral valve: Moderate posterior MAC. There was mild regurgitation. - Left atrium: Severely dilated (37 cm2). - Right atrium: Severely dilated. - Tricuspid valve: There was mild regurgitation. - Pulmonary arteries: PA peak pressure: 30 mm Hg (S). - Inferior vena cava: The vessel was normal in size. The respirophasic diameter changes were in the normal range (= 50%), consistent with normal central venous pressure. Impressions: - Compared to the prior echo in 3.2015, the EF has normalized. The aortic valve gradient has increased as well and the mean gradient is around 40 mmHg. This suggests "true" aortic stenosis, which is severe - AVA of 0.8-0.9 cm2.    ASSESSMENT AND PLAN [name] is a 80 y.o. male with a history of CAD (CABG 1995 BMS to SVG-PDA 2013), ischemic cardiomyopathy (EF dropped from 50% to 35-40% by echo 02/2013), moderate AS (mean grad 63, AVA 1 cmsq), chronic nonspecific IVCD, PAD, DM, chronicDVT (right mid femoral vein, left mid femoral vein, and left popliteal vein), mantle cell lymphoma (splenomegaly with thrombocytopenia, so taken off plavix) who presented to [facility] on 07/21/14 with syncope after an episode of CP during church and 1 SL NTG.  Syncope - nonexertional, during church. May have been NTG-related (especially if AS is severe), but initial symptoms were thought to be arrhythmic per Dr. Sallyanne Kuster. -- BB held  AS -severe. 2D ECHO yesterday with severe aortic stenosis. ( see report above)  -- Compared to the prior echo in 3.2015, the EF has normalized. The aortic valve gradient has increased as well and the mean gradient is  around 40 mmHg. This suggests "true" aortic stenosis, which is severe - AVA of 0.8-0.9 cm2.   SIRS- Fevers, chills, cough and vomiting two nights ago. IM consulted and no source of infection found. Fever resolved and antibiotics discontinued. -- Continue to monitor  CAD s/p CABG and PCI to SVG - angina free currently. -- Third troponin slightly elevated at 0.39. Now troponin normalized.  -- 2013 cath with   1. Severe three-vessel native coronary artery disease. LM is now occluded. There was scant flow through this previously.   2. Wide patency of the LIMA to LAD and saphenous vein graft sequenced to obtuse marginal branches left circumflex. Widely patent recent stent in the SVG to the RCA. He was continued on medical therapy at that time.   Chronic DVT (right mid femoral vein, left mid femoral vein, and left popliteal vein)- started DVT prophylaxis  DM- metformin held. Continue SSI.   Tyrell Antonio PA-C  Pager [phone]  I have examined the patient and reviewed assessment and plan and discussed with patient.  Agree with above as stated.  Feels back to baseline.  No infectious source identified.  Has some chronic pain from shingles.  Will wean oxygen and check sats.  If ok, plan discharge with earlier f/u with Dr. Burt Knack if possible for severe AS.  Syncope was likely due to combination of severe AS and NTG use.   VARANASI,JAYADEEP S.

## 2014-07-28 LAB — CULTURE, BLOOD (ROUTINE X 2)
Culture: NO GROWTH
Culture: NO GROWTH

## 2014-08-07 ENCOUNTER — Other Ambulatory Visit (HOSPITAL_COMMUNITY): Payer: Medicare Other

## 2014-08-09 ENCOUNTER — Encounter: Payer: Self-pay | Admitting: Physician Assistant

## 2014-08-09 ENCOUNTER — Ambulatory Visit (INDEPENDENT_AMBULATORY_CARE_PROVIDER_SITE_OTHER): Payer: Medicare Other | Admitting: Physician Assistant

## 2014-08-09 VITALS — BP 148/58 | HR 69 | Ht 72.0 in | Wt 184.0 lb

## 2014-08-09 DIAGNOSIS — R55 Syncope and collapse: Secondary | ICD-10-CM

## 2014-08-09 DIAGNOSIS — I6529 Occlusion and stenosis of unspecified carotid artery: Secondary | ICD-10-CM

## 2014-08-09 DIAGNOSIS — I2510 Atherosclerotic heart disease of native coronary artery without angina pectoris: Secondary | ICD-10-CM

## 2014-08-09 DIAGNOSIS — I359 Nonrheumatic aortic valve disorder, unspecified: Secondary | ICD-10-CM

## 2014-08-09 DIAGNOSIS — I498 Other specified cardiac arrhythmias: Secondary | ICD-10-CM

## 2014-08-09 DIAGNOSIS — R001 Bradycardia, unspecified: Secondary | ICD-10-CM

## 2014-08-09 DIAGNOSIS — I350 Nonrheumatic aortic (valve) stenosis: Secondary | ICD-10-CM

## 2014-08-09 DIAGNOSIS — I10 Essential (primary) hypertension: Secondary | ICD-10-CM

## 2014-08-09 DIAGNOSIS — E785 Hyperlipidemia, unspecified: Secondary | ICD-10-CM

## 2014-08-09 MED ORDER — NITROGLYCERIN 0.4 MG SL SUBL: 0.4000 mg | SUBLINGUAL_TABLET | SUBLINGUAL | Status: DC | PRN

## 2014-08-09 NOTE — Progress Notes (Signed)
Cardiology Office Note    Date:  08/09/2014   ID:  [name], DOB 01/27/1927, MRN [id number]  PCP:  Donnajean Lopes, MD  Cardiologist:  Dr. [name]     History of Present Illness: [name] is a 80 y.o. male with a hx of CAD, s/p CABG, s/p NSTEMI 2/13 => PCI with BMS to the S-PDA, aortic stenosis, DM2, HTN, PAD and Mantle Cell Lymphoma, anemia and thrombocytopenia, chronic R LE DVT, s/p 1st ray amputation 2/2 osteomyelitis.    Admitted 8/16-8/18 with syncope following an episode of chest pain treated with NTG.  Echo demonstrated worsening AV gradient with now severe AS.  His EF was normal.  CEs were unremarkable (one spurious result; remainder neg).  Syncope was felt to be related to hypotension from NTG in setting of severe AS.  He returns for FU.    I have reviewed the hospital records.  The patient tells me that he was sitting in church and became near syncopal with assoc chest pain.  He took NTG before passing out.  The HP notes that his HR was 35 and his beta blocker was stopped.  He returns with his wife.  He denies chest pain, significant dyspnea, orthopnea, PND, edema.  No further syncope.  He notes fatigue.     Studies:  - LHC 3/13: severe native 3 v CAD, patent S-PDA stent, S-CFX patent, L-LAD patent.  - Echo (8/15):  Mild LVH, EF 55-60%, Gr 1 DD, severe AS (mean 42 mmHg), Ao root 42 mm (mild dilated), mod MAC, mild MR, severe BAE, mild TR, PASP 30 mmHg  - Carotid US (3/15):  1-39% bilat ICA   Recent Labs/Images: 07/21/2014: Pro B Natriuretic peptide (BNP) 1058.0*  07/22/2014: ALT 11  07/23/2014: Creatinine 0.99; Hemoglobin 11.6*; Potassium 4.1    Wt Readings from Last 3 Encounters:  08/09/14 184 lb (83.462 kg)  07/23/14 182 lb 15.7 oz (83 kg)  05/17/14 185 lb (83.915 kg)     Past Medical History  Diagnosis Date  . CAD (coronary artery disease)     a. s/p CABG 1995;  b. NSTEMI & subsequent BMS to SVG->right PDA 02/03/12.;  c. Cath 02/14/2012 3vd  with 4/4 patent grafts and patent stent in vg->pda.  . Carotid stenosis     a. Carotid dopplers 5/39: RICA 76-73%, LICA 4-19%;  b. dopplers 3/14:  37-90% RICA, 2-40% LICA  . HTN (hypertension)   . HLD (hyperlipidemia)   . DM2 (diabetes mellitus, type 2)   . DJD (degenerative joint disease)   . Asthmatic bronchitis   . BPH (benign prostatic hypertrophy)   . Herpes zoster   . Anemia   . Thrombocytopenia     a. secondary to splenomegaly related to lymphoma with suspicion of bone marrow involvement. (Plavix had to be stopped due to this)  . Aortic stenosis, severe     a. ECHO 07/22/14 Severe AS. Peak and mean gradients of 57 mmHg and 42 mmHg, respectively. Calculated AVA is 0.8-0.9 cm2. Trivial AR. Valve area (VTI): 0.84 cm^2. Valve area (Vmax): 0.86 cm^2.. Aortic root dimension: 42 mm (ED) and aortic root mildly dilated.  Marland Kitchen Splenomegaly   . 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 Stage III s/p bendamustine, Rituxan therapy  . COPD (chronic obstructive pulmonary disease)   . Peripheral vascular disease     a. s/p L ray amp 10/13;  b. s/p L 2nd toe amp 12/13  . Pulmonary nodule  a. 22mm RUL calcified pulm nodule noted on CT staging for lymphoma - stable 10/2012.  . Leg DVT (deep venous thromboembolism), chronic     a. LE dopplers 5/13, 10/13 and 1/14: chronic DVT involving right mid femoral vein, left mid femoral vein, and left popliteal vein.  . Osteomyelitis     a. L first foot ray amputation 09/2012  . History of echocardiogram     a. 2D ECHO: 07/22/2014; 55-60%, mild LVG. G1DD. Severe AS. Peak and mean gradients of 57 mmHg and 42 mmHg, respectively. Calculated AVA is 0.8-0.9 cm2. Trivial AR. Valve area (VTI): 0.84 cm^2. Valve area (Vmax): 0.86 cm^2.. Aortic root dimension: 42 mm (ED) and aortic root mildly dilated.  Moderate posterior MAC. Mild MR. Severe LA dilation. Mild TR. PA pressure 30.     Current Outpatient Prescriptions  Medication Sig Dispense Refill  .  aspirin EC 81 MG tablet Take 81 mg by mouth every morning.       . gabapentin (NEURONTIN) 300 MG capsule Take 300-600 mg by mouth 3 (three) times daily. Take 600 mg in the morning, 600 mg at midday, and 300 mg at bedtime.      . isosorbide mononitrate (IMDUR) 30 MG 24 hr tablet Take 30 mg by mouth daily.      . metFORMIN (GLUCOPHAGE) 500 MG tablet Take 500 mg by mouth 2 (two) times daily with a meal.       . pravastatin (PRAVACHOL) 40 MG tablet Take 40 mg by mouth daily.      . vitamin B-12 (CYANOCOBALAMIN) 500 MCG tablet Take 1,000 mcg by mouth daily.        No current facility-administered medications for this visit.     Allergies:   Losartan; Azithromycin; Doxycycline; Keflex; Macrodantin; Minocycline hcl; Nitrofurantoin; Zithromax; and Contrast media   Social History:  The patient  reports that he quit smoking about 29 years ago. His smoking use included Cigarettes and Pipe. He has a 30 pack-year smoking history. He has never used smokeless tobacco. He reports that he does not drink alcohol or use illicit drugs.   Family History:  The patient's family history includes Alcohol abuse in an other family member; Coronary artery disease in an other family member.   ROS:  Please see the history of present illness.   He has a chronic cough without change   All other systems reviewed and negative.   PHYSICAL EXAM: VS:  BP 148/58  Pulse 69  Ht 6' (1.829 m)  Wt 184 lb (83.462 kg)  BMI 24.95 kg/m2 Well nourished, well developed, in no acute distress HEENT: normal Neck: no JVD Cardiac:  normal S1, diminished S2; irregular rhythm; 3/6 systolic murmur at RUSB Lungs:  clear to auscultation bilaterally, no wheezing, rhonchi or rales Abd: soft, nontender, no hepatomegaly Ext: no edema Skin: warm and dry Neuro:  CNs 2-12 intact, no focal abnormalities noted  EKG:  NSR, HR 69, probable 2 degree Type 1 block, no ST changes      ASSESSMENT AND PLAN:  1. Syncope, unspecified syncope type:   Symptoms occurred in the setting of NTG used and severe AS.  HR was noted to be in the 30s in the ED upon admission.  He is now off his beta blocker.  He has valvular heart disease with severe AS.  He has evidence of conduction system disease on his ECG.  I will arrange an event monitor.  I have advised him not to drive for now.   2.  Aortic stenosis, severe:  Gradients have worsened since earlier this year.  Discussed with Dr. [name].  Will proceed with event monitor first.  He will keep his FU later this month to discuss options for management of his AS (AVR vs TAVR).   3. CAD s/p CABG:  No further angina.  Continue current Rx.  He may need cardiac cath as part of the workup for his AS.  Continue ASA, nitrates, statin. 4. HYPERTENSION:  Controlled.  5. Hyperlipidemia:  Continue statin.   Disposition:  FU with Dr. [name] later this month as planned.    Signed, Versie Starks, MHS 08/09/2014 11:20 AM    [facility] [place], [place], [place]  [address] Phone: [phone]; Fax: [phone]

## 2014-08-09 NOTE — Patient Instructions (Signed)
REFILL FOR NTG HAS BEEN SENT IN # 25 X 4 REFILLS  Your physician has recommended that you wear an event monitor. Event monitors are medical devices that record the heart's electrical activity. Doctors most often [place] these monitors to diagnose arrhythmias. Arrhythmias are problems with the speed or rhythm of the heartbeat. The monitor is a small, portable device. You can wear one while you do your normal daily activities. This is usually used to diagnose what is causing palpitations/syncope (passing out).   KEEP APPT WITH DR. Burt Knack 08/28/14

## 2014-08-13 ENCOUNTER — Encounter: Payer: Self-pay | Admitting: *Deleted

## 2014-08-13 ENCOUNTER — Encounter (INDEPENDENT_AMBULATORY_CARE_PROVIDER_SITE_OTHER): Payer: Medicare Other

## 2014-08-13 DIAGNOSIS — R55 Syncope and collapse: Secondary | ICD-10-CM

## 2014-08-13 DIAGNOSIS — R001 Bradycardia, unspecified: Secondary | ICD-10-CM

## 2014-08-13 DIAGNOSIS — I498 Other specified cardiac arrhythmias: Secondary | ICD-10-CM

## 2014-08-13 NOTE — Progress Notes (Signed)
Patient ID: [name], male   DOB: 14-Dec-1926, 80 y.o.   MRN: [id number] E-Cardio verite 30 day cardiac event monitor applied to patient.

## 2014-08-28 ENCOUNTER — Ambulatory Visit (INDEPENDENT_AMBULATORY_CARE_PROVIDER_SITE_OTHER): Payer: Medicare Other | Admitting: Cardiovascular Disease

## 2014-08-28 ENCOUNTER — Encounter: Payer: Self-pay | Admitting: Cardiovascular Disease

## 2014-08-28 VITALS — BP 115/60 | HR 70 | Ht 72.0 in | Wt 186.1 lb

## 2014-08-28 DIAGNOSIS — I6529 Occlusion and stenosis of unspecified carotid artery: Secondary | ICD-10-CM

## 2014-08-28 DIAGNOSIS — R9431 Abnormal electrocardiogram [ECG] [EKG]: Secondary | ICD-10-CM

## 2014-08-28 DIAGNOSIS — I359 Nonrheumatic aortic valve disorder, unspecified: Secondary | ICD-10-CM

## 2014-08-28 DIAGNOSIS — I350 Nonrheumatic aortic (valve) stenosis: Secondary | ICD-10-CM

## 2014-08-28 NOTE — Progress Notes (Signed)
HPI: 80 year old gentleman presenting for followup evaluation. The patient has been followed for coronary artery disease status post CABG. He has undergone PCI in 2013 when he presented with a non-ST elevation infarction. The vein graft to PDA was stented with a bare-metal stent. The patient also has been diagnosed with severe aortic stenosis, type 2 diabetes, hypertension, peripheral arterial disease, and Mantle cell lymphoma. He underwent left transmetatarsal amputation in January because of osteomyelitis. He was recently hospitalized after a syncopal episode. He was sitting in church and began to feel bad with nausea, diaphoresis, and lightheadedness. He took a nitroglycerin and had frank syncope. He was noted to be hypotensive and bradycardic following the event. The patient was hospitalized and his beta blocker was stopped. He was advised to avoid nitroglycerin.  The patient has been wearing an event monitor since hospital discharge. He actually feels well after medication adjustment. He denies any further episodes of lightheadedness or presyncope. He's been out doing a little work in his yard without problems. He denies chest pain, chest pressure, or shortness of breath that his current activity level. He's had no leg swelling, orthopnea, or PND.   Outpatient Encounter Prescriptions as of 08/28/2014  Medication Sig  . aspirin EC 81 MG tablet Take 81 mg by mouth every morning.   . gabapentin (NEURONTIN) 300 MG capsule Take 300-600 mg by mouth 3 (three) times daily. Take 600 mg in the morning, 600 mg at midday, and 300 mg at bedtime.  . isosorbide mononitrate (IMDUR) 30 MG 24 hr tablet Take 30 mg by mouth daily.  . metFORMIN (GLUCOPHAGE) 500 MG tablet Take 500 mg by mouth 2 (two) times daily with a meal.   . nitroGLYCERIN (NITROSTAT) 0.4 MG SL tablet Place 1 tablet (0.4 mg total) under the tongue every 5 (five) minutes as needed.  . pravastatin (PRAVACHOL) 40 MG tablet Take 40 mg by mouth daily.   Marland Kitchen sulfamethoxazole-trimethoprim (BACTRIM DS) 800-160 MG per tablet   . vitamin B-12 (CYANOCOBALAMIN) 500 MCG tablet Take 1,000 mcg by mouth daily.     Allergies  Allergen Reactions  . Losartan     Hyperkalemia > 7  . Azithromycin Itching  . Doxycycline Itching       . Keflex [Cephalexin] Itching  . Macrodantin Itching  . Minocycline Hcl Itching  . Nitrofurantoin Itching  . Zithromax [Azithromycin Dihydrate] Itching  . Contrast Media [Iodinated Diagnostic Agents] Rash    Past Medical History  Diagnosis Date  . CAD (coronary artery disease)     a. s/p CABG 1995;  b. NSTEMI & subsequent BMS to SVG->right PDA 02/03/12.;  c. Cath 02/14/2012 3vd with 4/4 patent grafts and patent stent in vg->pda.  . Carotid stenosis     a. Carotid dopplers 9/37: RICA 34-28%, LICA 7-68%;  b. dopplers 3/14:  11-57% RICA, 2-62% LICA  . HTN (hypertension)   . HLD (hyperlipidemia)   . DM2 (diabetes mellitus, type 2)   . DJD (degenerative joint disease)   . Asthmatic bronchitis   . BPH (benign prostatic hypertrophy)   . Herpes zoster   . Anemia   . Thrombocytopenia     a. secondary to splenomegaly related to lymphoma with suspicion of bone marrow involvement. (Plavix had to be stopped due to this)  . Aortic stenosis, severe     a. ECHO 07/22/14 Severe AS. Peak and mean gradients of 57 mmHg and 42 mmHg, respectively. Calculated AVA is 0.8-0.9 cm2. Trivial AR. Valve area (VTI): 0.84 cm^2. Valve area (  Vmax): 0.86 cm^2.. Aortic root dimension: 42 mm (ED) and aortic root mildly dilated.  Marland Kitchen Splenomegaly   . 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 Stage III s/p bendamustine, Rituxan therapy  . COPD (chronic obstructive pulmonary disease)   . Peripheral vascular disease     a. s/p L ray amp 10/13;  b. s/p L 2nd toe amp 12/13  . Pulmonary nodule     a. 8mm RUL calcified pulm nodule noted on CT staging for lymphoma - stable 10/2012.  . Leg DVT (deep venous thromboembolism), chronic     a.  LE dopplers 5/13, 10/13 and 1/14: chronic DVT involving right mid femoral vein, left mid femoral vein, and left popliteal vein.  . Osteomyelitis     a. L first foot ray amputation 09/2012  . History of echocardiogram     a. 2D ECHO: 07/22/2014; 55-60%, mild LVG. G1DD. Severe AS. Peak and mean gradients of 57 mmHg and 42 mmHg, respectively. Calculated AVA is 0.8-0.9 cm2. Trivial AR. Valve area (VTI): 0.84 cm^2. Valve area (Vmax): 0.86 cm^2.. Aortic root dimension: 42 mm (ED) and aortic root mildly dilated.  Moderate posterior MAC. Mild MR. Severe LA dilation. Mild TR. PA pressure 30.     ROS: Negative except as per HPI  BP 115/60  Pulse 70  Ht 6' (1.829 m)  Wt 186 lb 1.9 oz (84.423 kg)  BMI 25.24 kg/m2  PHYSICAL EXAM: Pt is alert and oriented, pleasant elderly male in NAD HEENT: normal Neck: JVP - normal, carotids delayed with transmitted murmur noted Lungs: CTA bilaterally CV: RRR with harsh grade 3/6 late peaking crescendo decrescendo murmur at her upper sternal border Abd: soft, NT, Positive BS, no hepatomegaly Ext: no C/C/E Skin: warm/dry no rash  EKG:  Normal sinus rhythm 70 beats per minute, first-degree AV block, nonspecific IVCD. Possible age-indeterminate septal infarct.  2D Echo: Study Conclusions  - Left ventricle: The cavity size was normal. Wall thickness was increased in a pattern of mild LVH. Systolic function was normal. The estimated ejection fraction was in the range of 55% to 60%. Images were inadequate for LV wall motion assessment. Doppler parameters are consistent with abnormal left ventricular relaxation (grade 1 diastolic dysfunction). The E/e&' ratio is between 8-15, suggesting indeterminate LV filling pressure. - Aortic valve: Moderately calcified leafelts with reduced excursion. There is severe aortic stenosis. Peak and mean gradients of 57 mmHg and 42 mmHg, respectively. Based on an LVOT diameter of 2.0 cm, the calculated AVA is 0.8-0.9 cm2. There  was trivial regurgitation. Valve area (VTI): 0.84 cm^2. Valve area (Vmax): 0.86 cm^2. - Aorta: Aortic root dimension: 42 mm (ED). - Aortic root: The aortic root is mildly dilated. - Mitral valve: Moderate posterior MAC. There was mild regurgitation. - Left atrium: Severely dilated (37 cm2). - Right atrium: Severely dilated. - Tricuspid valve: There was mild regurgitation. - Pulmonary arteries: PA peak pressure: 30 mm Hg (S). - Inferior vena cava: The vessel was normal in size. The respirophasic diameter changes were in the normal range (= 50%), consistent with normal central venous pressure.  Impressions:  - Compared to the prior echo in 3.2015, the EF has normalized. The aortic valve gradient has increased as well and the mean gradient is around 40 mmHg. This suggests &quot;true&quot; aortic stenosis, which is severe - AVA of 0.8-0.9 cm2.  ASSESSMENT AND PLAN: 1. Severe aortic stenosis with recent episode of syncope 2. Coronary artery disease status post CABG without symptoms of active  ischemia. 3. Hypertension, controlled 4. Type 2 diabetes, managed by primary physician 5. Mantle cell lymphoma, followed by Dr. Beryle Beams 6. Hx osteomyelitis s/p left transmetatarsal amputation January 2050  The patient is clinically improved after stopping his beta blocker. He is tolerating a low dose of long-acting isosorbide, but should continue to avoid sublingual nitroglycerin. Despite his relative lack of symptoms at present, he clearly has progressive and now severe aortic stenosis with a recent episode of presyncope evolving to frank syncope with the use of sublingual nitroglycerin. The patient is here with his wife today for further discussion regarding treatment options for his aortic stenosis. We reviewed the natural history of severe aortic stenosis at length. Treatment options were compared. He would like to proceed with further evaluation. I think the next step is to proceed with cardiac  catheterization considering his history of coronary artery disease, CABG, and vein graft PCI. As long as his bypass grafts remain patent, TAVR might be a reasonable treatment option for this elderly gentleman with multiple medical problems as outlined above.   I have reviewed the risks, indications, and alternatives to cardiac catheterization were reviewed with the patient. Risks include but are not limited to bleeding, infection, vascular injury, stroke, myocardial infection, arrhythmia, emergency cardiac surgery, and death. The patient understands the risks of serious complication is low (<1%).   Following cardiac catheterization, I will likely refer him to Dr. Cyndia Bent for further evaluation with respect to his aortic stenosis.  [name] MD 08/28/2014 11:41 AM

## 2014-08-28 NOTE — Patient Instructions (Signed)
Your physician recommends that you continue on your current medications as directed. Please refer to the Current Medication list given to you today.  It is ok to remove your cardiac monitor and mail it back to the issuing company with the box provided  Your physician recommends that you return for lab work on 09/19/14 between 7:30am-5:15pm   Your physician has requested that you have a cardiac catheterization. Cardiac catheterization is used to diagnose and/or treat various heart conditions. Doctors may recommend this procedure for a number of different reasons. The most common reason is to evaluate chest pain. Chest pain can be a symptom of coronary artery disease (CAD), and cardiac catheterization can show whether plaque is narrowing or blocking your heart's arteries. This procedure is also used to evaluate the valves, as well as measure the blood flow and oxygen levels in different parts of your heart. For further information please visit [email or web address]. Please follow instruction sheet, as given.

## 2014-08-29 ENCOUNTER — Other Ambulatory Visit: Payer: Self-pay

## 2014-08-29 ENCOUNTER — Telehealth: Payer: Self-pay

## 2014-08-29 MED ORDER — PREDNISONE 20 MG PO TABS: ORAL_TABLET | ORAL | Status: DC

## 2014-08-29 MED ORDER — FAMOTIDINE 20 MG PO TABS: ORAL_TABLET | ORAL | Status: DC

## 2014-08-29 NOTE — Telephone Encounter (Signed)
Patient's wife call about the prednisone and pepcid had not been sent to the Pharmacy. I gave the note to [facility]

## 2014-08-29 NOTE — Telephone Encounter (Signed)
Lmom. Pt pre procedure medication sent to pt pharmacy

## 2014-08-29 NOTE — Telephone Encounter (Signed)
lmom. preprocedure medications sent to pt pharmacy.

## 2014-08-31 ENCOUNTER — Encounter: Payer: Self-pay | Admitting: Cardiovascular Disease

## 2014-09-16 ENCOUNTER — Encounter (HOSPITAL_COMMUNITY): Payer: Self-pay | Admitting: Pharmacy Technician

## 2014-09-19 ENCOUNTER — Other Ambulatory Visit: Payer: Self-pay | Admitting: Cardiovascular Disease

## 2014-09-19 ENCOUNTER — Other Ambulatory Visit (INDEPENDENT_AMBULATORY_CARE_PROVIDER_SITE_OTHER): Payer: Medicare Other | Admitting: *Deleted

## 2014-09-19 DIAGNOSIS — I350 Nonrheumatic aortic (valve) stenosis: Secondary | ICD-10-CM

## 2014-09-19 LAB — CBC WITH DIFFERENTIAL/PLATELET
Basophils Absolute: 0 10*3/uL (ref 0.0–0.1)
Basophils Relative: 0.6 % (ref 0.0–3.0)
EOS ABS: 0.1 10*3/uL (ref 0.0–0.7)
Eosinophils Relative: 3.3 % (ref 0.0–5.0)
HCT: 38.6 % — ABNORMAL LOW (ref 39.0–52.0)
Hemoglobin: 12.8 g/dL — ABNORMAL LOW (ref 13.0–17.0)
Lymphocytes Relative: 22 % (ref 12.0–46.0)
Lymphs Abs: 0.8 10*3/uL (ref 0.7–4.0)
MCHC: 33 g/dL (ref 30.0–36.0)
MCV: 97.5 fl (ref 78.0–100.0)
MONO ABS: 0.4 10*3/uL (ref 0.1–1.0)
Monocytes Relative: 12.8 % — ABNORMAL HIGH (ref 3.0–12.0)
NEUTROS PCT: 61.3 % (ref 43.0–77.0)
Neutro Abs: 2.1 10*3/uL (ref 1.4–7.7)
Platelets: 100 10*3/uL — ABNORMAL LOW (ref 150.0–400.0)
RBC: 3.96 Mil/uL — ABNORMAL LOW (ref 4.22–5.81)
RDW: 16.1 % — ABNORMAL HIGH (ref 11.5–15.5)
WBC: 3.5 10*3/uL — AB (ref 4.0–10.5)

## 2014-09-19 LAB — BASIC METABOLIC PANEL
BUN: 28 mg/dL — ABNORMAL HIGH (ref 6–23)
CO2: 27 meq/L (ref 19–32)
CREATININE: 1.3 mg/dL (ref 0.4–1.5)
Calcium: 10.1 mg/dL (ref 8.4–10.5)
Chloride: 105 mEq/L (ref 96–112)
GFR: 55.75 mL/min — ABNORMAL LOW (ref >60.00)
Glucose, Bld: 135 mg/dL — ABNORMAL HIGH (ref 70–99)
Potassium: 4.9 mEq/L (ref 3.5–5.1)
Sodium: 139 mEq/L (ref 135–145)

## 2014-09-19 LAB — PROTIME-INR
INR: 1 ratio (ref 0.8–1.0)
Prothrombin Time: 11.5 s (ref 9.6–13.1)

## 2014-09-20 ENCOUNTER — Telehealth: Payer: Self-pay | Admitting: Cardiovascular Disease

## 2014-09-20 NOTE — Telephone Encounter (Signed)
Pre cath labs reviewed by Tera Helper NP and will have patient increase water intake and recheck BMET Monday morning prior to cath at hospital. Advised patient and called Trish to have recheck Monday

## 2014-09-20 NOTE — Telephone Encounter (Signed)
Follow up  ° ° ° °Returning call back to nurse  °

## 2014-09-23 ENCOUNTER — Ambulatory Visit (HOSPITAL_COMMUNITY)
Admission: RE | Admit: 2014-09-23 | Discharge: 2014-09-23 | Disposition: A | Discharge status: Home / Self Care | Payer: Medicare Other | Source: Ambulatory Visit | Attending: Cardiovascular Disease | Admitting: Cardiovascular Disease

## 2014-09-23 ENCOUNTER — Encounter (HOSPITAL_COMMUNITY)
Admission: RE | Disposition: A | Discharge status: Home / Self Care | Payer: Self-pay | Source: Ambulatory Visit | Attending: Cardiovascular Disease

## 2014-09-23 DIAGNOSIS — E119 Type 2 diabetes mellitus without complications: Secondary | ICD-10-CM | POA: Diagnosis not present

## 2014-09-23 DIAGNOSIS — Z951 Presence of aortocoronary bypass graft: Secondary | ICD-10-CM | POA: Diagnosis not present

## 2014-09-23 DIAGNOSIS — Z7982 Long term (current) use of aspirin: Secondary | ICD-10-CM | POA: Insufficient documentation

## 2014-09-23 DIAGNOSIS — J449 Chronic obstructive pulmonary disease, unspecified: Secondary | ICD-10-CM | POA: Insufficient documentation

## 2014-09-23 DIAGNOSIS — Z89432 Acquired absence of left foot: Secondary | ICD-10-CM | POA: Diagnosis not present

## 2014-09-23 DIAGNOSIS — I10 Essential (primary) hypertension: Secondary | ICD-10-CM | POA: Insufficient documentation

## 2014-09-23 DIAGNOSIS — T82857A Stenosis of cardiac prosthetic devices, implants and grafts, initial encounter: Secondary | ICD-10-CM | POA: Insufficient documentation

## 2014-09-23 DIAGNOSIS — I350 Nonrheumatic aortic (valve) stenosis: Secondary | ICD-10-CM | POA: Insufficient documentation

## 2014-09-23 DIAGNOSIS — I2510 Atherosclerotic heart disease of native coronary artery without angina pectoris: Secondary | ICD-10-CM | POA: Diagnosis not present

## 2014-09-23 DIAGNOSIS — E118 Type 2 diabetes mellitus with unspecified complications: Secondary | ICD-10-CM | POA: Diagnosis not present

## 2014-09-23 HISTORY — PX: LEFT AND RIGHT HEART CATHETERIZATION WITH CORONARY/GRAFT ANGIOGRAM: SHX5448

## 2014-09-23 LAB — POCT I-STAT 3, ART BLOOD GAS (G3+)
Acid-base deficit: 2 mmol/L (ref 0.0–2.0)
BICARBONATE: 23.1 meq/L (ref 20.0–24.0)
O2 Saturation: 98 %
PH ART: 7.371 (ref 7.350–7.450)
TCO2: 24 mmol/L (ref 0–100)
pCO2 arterial: 39.8 mmHg (ref 35.0–45.0)
pO2, Arterial: 108 mmHg — ABNORMAL HIGH (ref 80.0–100.0)

## 2014-09-23 LAB — BASIC METABOLIC PANEL
Anion gap: 13 (ref 5–15)
BUN: 21 mg/dL (ref 6–23)
CALCIUM: 10 mg/dL (ref 8.4–10.5)
CO2: 25 meq/L (ref 19–32)
Chloride: 103 mEq/L (ref 96–112)
Creatinine, Ser: 0.91 mg/dL (ref 0.50–1.35)
GFR calc Af Amer: 87 mL/min — ABNORMAL LOW (ref >90)
GFR calc non Af Amer: 75 mL/min — ABNORMAL LOW (ref >90)
Glucose, Bld: 151 mg/dL — ABNORMAL HIGH (ref 70–99)
POTASSIUM: 4.3 meq/L (ref 3.7–5.3)
Sodium: 141 mEq/L (ref 137–147)

## 2014-09-23 LAB — GLUCOSE, CAPILLARY
GLUCOSE-CAPILLARY: 144 mg/dL — AB (ref 70–99)
GLUCOSE-CAPILLARY: 157 mg/dL — AB (ref 70–99)

## 2014-09-23 LAB — POCT I-STAT 3, VENOUS BLOOD GAS (G3P V)
Acid-base deficit: 2 mmol/L (ref 0.0–2.0)
BICARBONATE: 24.2 meq/L — AB (ref 20.0–24.0)
O2 Saturation: 75 %
PH VEN: 7.34 — AB (ref 7.250–7.300)
PO2 VEN: 43 mmHg (ref 30.0–45.0)
TCO2: 26 mmol/L (ref 0–100)
pCO2, Ven: 44.8 mmHg — ABNORMAL LOW (ref 45.0–50.0)

## 2014-09-23 SURGERY — LEFT AND RIGHT HEART CATHETERIZATION WITH CORONARY/GRAFT ANGIOGRAM
Anesthesia: LOCAL

## 2014-09-23 MED ORDER — SODIUM CHLORIDE 0.9 % IV SOLN
INTRAVENOUS | Status: DC
  Administered 2014-09-23: 06:00:00 via INTRAVENOUS

## 2014-09-23 MED ORDER — SODIUM CHLORIDE 0.9 % IJ SOLN: 3.0000 mL | Freq: Two times a day (BID) | INTRAMUSCULAR | Status: DC

## 2014-09-23 MED ORDER — SODIUM CHLORIDE 0.9 % IV SOLN: 250.0000 mL | INTRAVENOUS | Status: DC | PRN

## 2014-09-23 MED ORDER — SODIUM CHLORIDE 0.9 % IV SOLN: 1.0000 mL/kg/h | INTRAVENOUS | Status: DC

## 2014-09-23 MED ORDER — ACETAMINOPHEN 325 MG PO TABS: 650.0000 mg | ORAL_TABLET | ORAL | Status: DC | PRN

## 2014-09-23 MED ORDER — DIPHENHYDRAMINE HCL 50 MG/ML IJ SOLN
INTRAMUSCULAR | Status: AC
  Filled 2014-09-23: qty 1

## 2014-09-23 MED ORDER — HEPARIN (PORCINE) IN NACL 2-0.9 UNIT/ML-% IJ SOLN
INTRAMUSCULAR | Status: AC
  Filled 2014-09-23: qty 1500

## 2014-09-23 MED ORDER — MIDAZOLAM HCL 2 MG/2ML IJ SOLN
INTRAMUSCULAR | Status: AC
  Filled 2014-09-23: qty 2

## 2014-09-23 MED ORDER — SODIUM CHLORIDE 0.9 % IJ SOLN: 3.0000 mL | INTRAMUSCULAR | Status: DC | PRN

## 2014-09-23 MED ORDER — DIPHENHYDRAMINE HCL 50 MG/ML IJ SOLN
25.0000 mg | INTRAMUSCULAR | Status: AC
  Administered 2014-09-23: 25 mg via INTRAVENOUS

## 2014-09-23 MED ORDER — LIDOCAINE HCL (PF) 1 % IJ SOLN
INTRAMUSCULAR | Status: AC
  Filled 2014-09-23: qty 30

## 2014-09-23 MED ORDER — ONDANSETRON HCL 4 MG/2ML IJ SOLN: 4.0000 mg | Freq: Four times a day (QID) | INTRAMUSCULAR | Status: DC | PRN

## 2014-09-23 MED ORDER — FENTANYL CITRATE 0.05 MG/ML IJ SOLN
INTRAMUSCULAR | Status: AC
  Filled 2014-09-23: qty 2

## 2014-09-23 MED ORDER — METHYLPREDNISOLONE SODIUM SUCC 125 MG IJ SOLR
INTRAMUSCULAR | Status: AC
  Filled 2014-09-23: qty 2

## 2014-09-23 MED ORDER — METHYLPREDNISOLONE SODIUM SUCC 125 MG IJ SOLR
125.0000 mg | INTRAMUSCULAR | Status: AC
  Administered 2014-09-23: 125 mg via INTRAVENOUS

## 2014-09-23 MED ORDER — NITROGLYCERIN 1 MG/10 ML FOR IR/CATH LAB
INTRA_ARTERIAL | Status: AC
  Filled 2014-09-23: qty 10

## 2014-09-23 NOTE — H&P (View-Only) (Signed)
HPI: 80 year old gentleman presenting for followup evaluation. The patient has been followed for coronary artery disease status post CABG. He has undergone PCI in 2013 when he presented with a non-ST elevation infarction. The vein graft to PDA was stented with a bare-metal stent. The patient also has been diagnosed with severe aortic stenosis, type 2 diabetes, hypertension, peripheral arterial disease, and Mantle cell lymphoma. He underwent left transmetatarsal amputation in January because of osteomyelitis. He was recently hospitalized after a syncopal episode. He was sitting in church and began to feel bad with nausea, diaphoresis, and lightheadedness. He took a nitroglycerin and had frank syncope. He was noted to be hypotensive and bradycardic following the event. The patient was hospitalized and his beta blocker was stopped. He was advised to avoid nitroglycerin.  The patient has been wearing an event monitor since hospital discharge. He actually feels well after medication adjustment. He denies any further episodes of lightheadedness or presyncope. He's been out doing a little work in his yard without problems. He denies chest pain, chest pressure, or shortness of breath that his current activity level. He's had no leg swelling, orthopnea, or PND.   Outpatient Encounter Prescriptions as of 08/28/2014  Medication Sig  . aspirin EC 81 MG tablet Take 81 mg by mouth every morning.   . gabapentin (NEURONTIN) 300 MG capsule Take 300-600 mg by mouth 3 (three) times daily. Take 600 mg in the morning, 600 mg at midday, and 300 mg at bedtime.  . isosorbide mononitrate (IMDUR) 30 MG 24 hr tablet Take 30 mg by mouth daily.  . metFORMIN (GLUCOPHAGE) 500 MG tablet Take 500 mg by mouth 2 (two) times daily with a meal.   . nitroGLYCERIN (NITROSTAT) 0.4 MG SL tablet Place 1 tablet (0.4 mg total) under the tongue every 5 (five) minutes as needed.  . pravastatin (PRAVACHOL) 40 MG tablet Take 40 mg by mouth daily.   Marland Kitchen sulfamethoxazole-trimethoprim (BACTRIM DS) 800-160 MG per tablet   . vitamin B-12 (CYANOCOBALAMIN) 500 MCG tablet Take 1,000 mcg by mouth daily.     Allergies  Allergen Reactions  . Losartan     Hyperkalemia > 7  . Azithromycin Itching  . Doxycycline Itching       . Keflex [Cephalexin] Itching  . Macrodantin Itching  . Minocycline Hcl Itching  . Nitrofurantoin Itching  . Zithromax [Azithromycin Dihydrate] Itching  . Contrast Media [Iodinated Diagnostic Agents] Rash    Past Medical History  Diagnosis Date  . CAD (coronary artery disease)     a. s/p CABG 1995;  b. NSTEMI & subsequent BMS to SVG->right PDA 02/03/12.;  c. Cath 02/14/2012 3vd with 4/4 patent grafts and patent stent in vg->pda.  . Carotid stenosis     a. Carotid dopplers 5/73: RICA 22-02%, LICA 5-42%;  b. dopplers 3/14:  70-62% RICA, 3-76% LICA  . HTN (hypertension)   . HLD (hyperlipidemia)   . DM2 (diabetes mellitus, type 2)   . DJD (degenerative joint disease)   . Asthmatic bronchitis   . BPH (benign prostatic hypertrophy)   . Herpes zoster   . Anemia   . Thrombocytopenia     a. secondary to splenomegaly related to lymphoma with suspicion of bone marrow involvement. (Plavix had to be stopped due to this)  . Aortic stenosis, severe     a. ECHO 07/22/14 Severe AS. Peak and mean gradients of 57 mmHg and 42 mmHg, respectively. Calculated AVA is 0.8-0.9 cm2. Trivial AR. Valve area (VTI): 0.84 cm^2. Valve area (  Vmax): 0.86 cm^2.. Aortic root dimension: 42 mm (ED) and aortic root mildly dilated.  Marland Kitchen Splenomegaly   . 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 Stage III s/p bendamustine, Rituxan therapy  . COPD (chronic obstructive pulmonary disease)   . Peripheral vascular disease     a. s/p L ray amp 10/13;  b. s/p L 2nd toe amp 12/13  . Pulmonary nodule     a. 79mm RUL calcified pulm nodule noted on CT staging for lymphoma - stable 10/2012.  . Leg DVT (deep venous thromboembolism), chronic     a.  LE dopplers 5/13, 10/13 and 1/14: chronic DVT involving right mid femoral vein, left mid femoral vein, and left popliteal vein.  . Osteomyelitis     a. L first foot ray amputation 09/2012  . History of echocardiogram     a. 2D ECHO: 07/22/2014; 55-60%, mild LVG. G1DD. Severe AS. Peak and mean gradients of 57 mmHg and 42 mmHg, respectively. Calculated AVA is 0.8-0.9 cm2. Trivial AR. Valve area (VTI): 0.84 cm^2. Valve area (Vmax): 0.86 cm^2.. Aortic root dimension: 42 mm (ED) and aortic root mildly dilated.  Moderate posterior MAC. Mild MR. Severe LA dilation. Mild TR. PA pressure 30.     ROS: Negative except as per HPI  BP 115/60  Pulse 70  Ht 6' (1.829 m)  Wt 186 lb 1.9 oz (84.423 kg)  BMI 25.24 kg/m2  PHYSICAL EXAM: Pt is alert and oriented, pleasant elderly male in NAD HEENT: normal Neck: JVP - normal, carotids delayed with transmitted murmur noted Lungs: CTA bilaterally CV: RRR with harsh grade 3/6 late peaking crescendo decrescendo murmur at her upper sternal border Abd: soft, NT, Positive BS, no hepatomegaly Ext: no C/C/E Skin: warm/dry no rash  EKG:  Normal sinus rhythm 70 beats per minute, first-degree AV block, nonspecific IVCD. Possible age-indeterminate septal infarct.  2D Echo: Study Conclusions  - Left ventricle: The cavity size was normal. Wall thickness was increased in a pattern of mild LVH. Systolic function was normal. The estimated ejection fraction was in the range of 55% to 60%. Images were inadequate for LV wall motion assessment. Doppler parameters are consistent with abnormal left ventricular relaxation (grade 1 diastolic dysfunction). The E/e&' ratio is between 8-15, suggesting indeterminate LV filling pressure. - Aortic valve: Moderately calcified leafelts with reduced excursion. There is severe aortic stenosis. Peak and mean gradients of 57 mmHg and 42 mmHg, respectively. Based on an LVOT diameter of 2.0 cm, the calculated AVA is 0.8-0.9 cm2. There  was trivial regurgitation. Valve area (VTI): 0.84 cm^2. Valve area (Vmax): 0.86 cm^2. - Aorta: Aortic root dimension: 42 mm (ED). - Aortic root: The aortic root is mildly dilated. - Mitral valve: Moderate posterior MAC. There was mild regurgitation. - Left atrium: Severely dilated (37 cm2). - Right atrium: Severely dilated. - Tricuspid valve: There was mild regurgitation. - Pulmonary arteries: PA peak pressure: 30 mm Hg (S). - Inferior vena cava: The vessel was normal in size. The respirophasic diameter changes were in the normal range (= 50%), consistent with normal central venous pressure.  Impressions:  - Compared to the prior echo in 3.2015, the EF has normalized. The aortic valve gradient has increased as well and the mean gradient is around 40 mmHg. This suggests &quot;true&quot; aortic stenosis, which is severe - AVA of 0.8-0.9 cm2.  ASSESSMENT AND PLAN: 1. Severe aortic stenosis with recent episode of syncope 2. Coronary artery disease status post CABG without symptoms of active  ischemia. 3. Hypertension, controlled 4. Type 2 diabetes, managed by primary physician 5. Mantle cell lymphoma, followed by Dr. Beryle Beams 6. Hx osteomyelitis s/p left transmetatarsal amputation January 2050  The patient is clinically improved after stopping his beta blocker. He is tolerating a low dose of long-acting isosorbide, but should continue to avoid sublingual nitroglycerin. Despite his relative lack of symptoms at present, he clearly has progressive and now severe aortic stenosis with a recent episode of presyncope evolving to frank syncope with the use of sublingual nitroglycerin. The patient is here with his wife today for further discussion regarding treatment options for his aortic stenosis. We reviewed the natural history of severe aortic stenosis at length. Treatment options were compared. He would like to proceed with further evaluation. I think the next step is to proceed with cardiac  catheterization considering his history of coronary artery disease, CABG, and vein graft PCI. As long as his bypass grafts remain patent, TAVR might be a reasonable treatment option for this elderly gentleman with multiple medical problems as outlined above.   I have reviewed the risks, indications, and alternatives to cardiac catheterization were reviewed with the patient. Risks include but are not limited to bleeding, infection, vascular injury, stroke, myocardial infection, arrhythmia, emergency cardiac surgery, and death. The patient understands the risks of serious complication is low (<1%).   Following cardiac catheterization, I will likely refer him to Dr. Cyndia Bent for further evaluation with respect to his aortic stenosis.  [name] MD 08/28/2014 11:41 AM

## 2014-09-23 NOTE — Discharge Instructions (Signed)
Angiogram, Care After °Refer to this sheet in the next few weeks. These instructions provide you with information on caring for yourself after your procedure. Your health care provider may also give you more specific instructions. Your treatment has been planned according to current medical practices, but problems sometimes occur. Call your health care provider if you have any problems or questions after your procedure.  °WHAT TO EXPECT AFTER THE PROCEDURE °After your procedure, it is typical to have the following sensations: °· Minor discomfort or tenderness and a small bump at the catheter insertion site. The bump should usually decrease in size and tenderness within 1 to 2 weeks. °· Any bruising will usually fade within 2 to 4 weeks. °HOME CARE INSTRUCTIONS  °· You may need to keep taking blood thinners if they were prescribed for you. Take medicines only as directed by your health care provider. °· Do not apply powder or lotion to the site. °· Do not take baths, swim, or use a hot tub until your health care provider approves. °· You may shower 24 hours after the procedure. Remove the bandage (dressing) and gently wash the site with plain soap and water. Gently pat the site dry. °· Inspect the site at least twice daily. °· Limit your activity for the first 48 hours. Do not bend, squat, or lift anything over 20 lb (9 kg) or as directed by your health care provider. °· Plan to have someone take you home after the procedure. Follow instructions about when you can drive or return to work. °SEEK MEDICAL CARE IF: °· You get light-headed when standing up. °· You have drainage (other than a small amount of blood on the dressing). °· You have chills. °· You have a fever. °· You have redness, warmth, swelling, or pain at the insertion site. °SEEK IMMEDIATE MEDICAL CARE IF:  °· You develop chest pain or shortness of breath, feel faint, or pass out. °· You have bleeding, swelling larger than a walnut, or drainage from the  catheter insertion site. °· You develop pain, discoloration, coldness, or severe bruising in the leg or arm that held the catheter. °· You have heavy bleeding from the site. If this happens, hold pressure on the site and call 911. °MAKE SURE YOU: °· Understand these instructions. °· Will watch your condition. °· Will get help right away if you are not doing well or get worse. °Document Released: 06/10/2005 Document Revised: 04/08/2014 Document Reviewed: 04/16/2013 °[employer]® Patient Information ©2015 [employer]. This information is not intended to replace advice given to you by your health care provider. Make sure you discuss any questions you have with your health care provider. ° °

## 2014-09-23 NOTE — Interval H&P Note (Signed)
History and Physical Interval Note:  09/23/2014 7:53 AM  [name]  has presented today for surgery, with the diagnosis of Chest pain  The various methods of treatment have been discussed with the patient and family. After consideration of risks, benefits and other options for treatment, the patient has consented to  Procedure(s): LEFT AND RIGHT HEART CATHETERIZATION WITH CORONARY/GRAFT ANGIOGRAM (N/A) as a surgical intervention .  The patient's history has been reviewed, patient examined, no change in status, stable for surgery.  I have reviewed the patient's chart and labs.  Questions were answered to the patient's satisfaction.    Pt interviewed and examined. No changes from above. For right and left heart cath to evaluate severe aortic stenosis.  [name]

## 2014-09-23 NOTE — CV Procedure (Signed)
Cardiac Catheterization Procedure Note  Name: [name] MRN: [id number] DOB: [birth year]  Procedure: Right Heart Cath, Left Heart Cath, Selective Coronary Angiography, LIMA angiography, saphenous vein graft angiography, LV angiography  Indication: Severe symptomatic aortic stenosis, coronary artery disease status post remote CABG and PCI.  Procedural Details: The right groin was prepped, draped, and anesthetized with 1% lidocaine. Using the modified Seldinger technique a 7 French sheath was placed in the right femoral vein and a 5 French sheath was placed in the right femoral artery. A Swan-Ganz catheter was used for the right heart catheterization. Standard protocol was followed for recording of right heart pressures and sampling of oxygen saturations. Fick cardiac output was calculated. Standard Judkins catheters were used for selective coronary angiography and left ventriculography. His saphenous vein graft angiography was performed with a JR 4 catheter for the sequence graft to the left circumflex branches and an RCB cath for the saphenous vein graft to RCA. The LIMA was injected with the JR 4 catheter. The aortic valve was crossed using an AL2 (AL-1 would probably fit better) catheter and straight-tip wire. There were no immediate procedural complications. The patient was transferred to the post catheterization recovery area for further monitoring.  Procedural Findings: Hemodynamics RA 2 RV 25/2 PA 21/6 mean 10 PCWP mean 6 LV 173/10 AO 148/61 mean 95  Oxygen saturations: PA 75 AO 98  Cardiac Output (Fick) 6.8  Cardiac Index (Fick) 3.3  Aortic valve hemodynamics: Peak peak gradient 25, mean gradient 23, aortic valve area 1.3 cm   Coronary angiography: Coronary dominance: right  Left mainstem: 100% occlusion  Left anterior descending (LAD): Totally occluded from the native circulation. Fills entirely from the LIMA graft.  Left circumflex (LCx): Totally occluded from  the native circulation. Fills entirely from the sequence graft to OM1 and OM 2  Right coronary artery (RCA): Severely calcified. The vessel has long segment 70% stenosis in its midportion. It is 100% occluded in the distal portion.  Saphenous vein graft to PDA: The ostium and proximal portion of the graft is stented. The stented segment has sequential 95% stenoses. The remaining portions of the bypass graft are patent with mild diffuse disease noted in the distal body of the vein graft. The PDA insertion site appears patent. The PLA and distal RCA filled retrograde from graft flow.  Saphenous vein graft sequential to OM1 and OM 2: Widely patent throughout. There are 3 major OM branches filling from this graft.  LIMA to LAD: Widely patent throughout. The distal anastomosis is widely patent. The proximal LAD fills retrograde.  Left ventriculography: LV function appears low normal, with an LVEF estimated at 50-55%. The mitral annulus is severely calcified. The aortic valve is severely calcified.  Left ventriculography: Left ventricular systolic function is normal, LVEF is estimated at 55-65%, there is no significant mitral regurgitation   Estimated Blood Loss: Minimal  Final Conclusions:   1. Severe native three-vessel disease with total occlusion of the RCA and total occlusion of the left main 2. Status post aortocoronary bypass surgery with continued patency of the LIMA to LAD and sequential saphenous vein graft to OM1 and OM 2 3. Severe in-stent restenosis in the saphenous vein graft to PDA 4. Low normal LV systolic function with normal right heart cardiac filling pressures 5. Moderate to severe aortic stenosis  Recommendations: The patient's aortic stenosis is severe by echo criteria. His valve is heavily calcified with restricted opening of all leaflets. He also has high-grade obstructive disease in  the stented segment of the RCA graft. Will refer to Dr. Cyndia Bent for consideration of further  treatment options, which includes TAVR/PCI versus redo CABG/AVR  [name] MD, [facility] ([place]) 09/23/2014, 8:46 AM

## 2014-09-23 NOTE — Progress Notes (Addendum)
Site area: RFA/RFV Site Prior to Removal:  Level  0 Pressure Applied For: Manual:    Patient Status During Pull: stable  Post Pull Site:  Level Post Pull Instructions Given:   Post Pull Pulses Present: no changes Dressing Applied:  Clear  Bedrest begins @ 0930 Comments:no complications

## 2014-10-02 ENCOUNTER — Institutional Professional Consult (permissible substitution) (INDEPENDENT_AMBULATORY_CARE_PROVIDER_SITE_OTHER): Payer: Medicare Other | Admitting: Surgery

## 2014-10-02 ENCOUNTER — Encounter: Payer: Self-pay | Admitting: Surgery

## 2014-10-02 VITALS — BP 97/61 | HR 90 | Ht 72.0 in | Wt 186.0 lb

## 2014-10-02 DIAGNOSIS — I350 Nonrheumatic aortic (valve) stenosis: Secondary | ICD-10-CM

## 2014-10-03 ENCOUNTER — Other Ambulatory Visit: Payer: Self-pay | Admitting: *Deleted

## 2014-10-04 ENCOUNTER — Other Ambulatory Visit: Payer: Self-pay | Admitting: *Deleted

## 2014-10-04 ENCOUNTER — Encounter: Payer: Self-pay | Admitting: Surgery

## 2014-10-04 DIAGNOSIS — I350 Nonrheumatic aortic (valve) stenosis: Secondary | ICD-10-CM

## 2014-10-04 NOTE — Progress Notes (Signed)
Patient ID: [name], male   DOB: March 06, 1927, 80 y.o.   MRN: [id number]  [facility] REPORT  Referring Provider is [name], MD PCP is Donnajean Lopes, MD  Chief Complaint  Patient presents with  . NEW CARDIAC    EVAL FOR TAVR/PCI    HPI:  The patient is an 80 year old gentleman with type 2 DM, hypertension, and hyperlipidemia as well as know coronary disease s/p CABG x 4 by me in 1995 and subsequent aortic stenosis. He presented in 2013 with a NSTEMI and had a BMS placed in the vein graft to the PDA. He has had a complicated medical course over the past few years with treatment for a Mantle cell lymphoma in 2013-14 and multiple surgeries on his left foot resulting in a transmetatarsal amputation last January for osteomyelitis. He was recently hospitalized after a syncopal episode while sitting in church preceded by nausea, diaphoresis, and lightheadedness. He thought he was having an MI and took a NTG resulting in syncope with hypotension and bradycardia. His echo on 07/22/2014 showed severe AS with a mean gradient of 42 mm Hg and a peak of 57 mm Hg. The AVA was 0.8-0.9 cm 2. The mean gradient had increased from 35 mm Hg in March 2015.  He has had an event monitor on and has had no arrhythmias by report. He denies any further episodes of dizziness or syncope. He had had no chest pain or shortness of breath. He denies orthopnea and PND. He denies leg edema. His wife does note that he has not had much energy for the past 6 months or so and is worn out in the evening.   Past Medical History  Diagnosis Date  . CAD (coronary artery disease)     a. s/p CABG 1995;  b. NSTEMI & subsequent BMS to SVG->right PDA 02/03/12.;  c. Cath 02/14/2012 3vd with 4/4 patent grafts and patent stent in vg->pda.  . Carotid stenosis     a. Carotid dopplers 7/65: RICA 46-50%, LICA 3-54%;  b. dopplers 3/14:   65-68% RICA, 1-27% LICA  . HTN (hypertension)   . HLD (hyperlipidemia)   . DM2 (diabetes mellitus, type 2)   . DJD (degenerative joint disease)   . Asthmatic bronchitis   . BPH (benign prostatic hypertrophy)   . Herpes zoster   . Anemia   . Thrombocytopenia     a. secondary to splenomegaly related to lymphoma with suspicion of bone marrow involvement. (Plavix had to be stopped due to this)  . Aortic stenosis, severe     a. ECHO 07/22/14 Severe AS. Peak and mean gradients of 57 mmHg and 42 mmHg, respectively. Calculated AVA is 0.8-0.9 cm2. Trivial AR. Valve area (VTI): 0.84 cm^2. Valve area (Vmax): 0.86 cm^2.. Aortic root dimension: 42 mm (ED) and aortic root mildly dilated.  Marland Kitchen Splenomegaly   . 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 Stage III s/p bendamustine, Rituxan therapy  . COPD (chronic obstructive pulmonary disease)   . Peripheral vascular disease     a. s/p L ray amp 10/13;  b. s/p L 2nd toe amp 12/13  . Pulmonary nodule     a. 79mm RUL calcified pulm nodule noted on CT staging for lymphoma - stable 10/2012.  . Leg DVT (deep venous thromboembolism), chronic     a. LE dopplers 5/13, 10/13 and 1/14: chronic DVT involving right  mid femoral vein, left mid femoral vein, and left popliteal vein.  . Osteomyelitis     a. L first foot ray amputation 09/2012  . History of echocardiogram     a. 2D ECHO: 07/22/2014; 55-60%, mild LVG. G1DD. Severe AS. Peak and mean gradients of 57 mmHg and 42 mmHg, respectively. Calculated AVA is 0.8-0.9 cm2. Trivial AR. Valve area (VTI): 0.84 cm^2. Valve area (Vmax): 0.86 cm^2.. Aortic root dimension: 42 mm (ED) and aortic root mildly dilated.  Moderate posterior MAC. Mild MR. Severe LA dilation. Mild TR. PA pressure 30.     Past Surgical History  Procedure Laterality Date  . Coronary artery bypass graft  1995  . Rotator cuff repair    . Carpal tunnel release    . Incisional hernia repair    . Kidney surgery    . Back surgery    .  I&d extremity  09/16/2012    Procedure: IRRIGATION AND DEBRIDEMENT EXTREMITY;  Surgeon: Wylene Simmer, MD;  Location: [facility];  Service: Orthopedics;  Laterality: Left;  irrigation and debridement and first Ray amputation of left foot  . Amputation  09/16/2012    Procedure: AMPUTATION RAY;  Surgeon: Wylene Simmer, MD;  Location: [facility];  Service: Orthopedics;  Laterality: Left;  first Ray amputation left foot  . Amputation  11/21/2012    Procedure: AMPUTATION RAY;  Surgeon: Wylene Simmer, MD;  Location: [place];  Service: Orthopedics;  Laterality: Left;  LEFT SECOND TOE AMPUTATION  . I&d extremity Left 03/01/2013    Procedure: IRRIGATION AND DEBRIDEMENT EXTREMITY;  Surgeon: Wylene Simmer, MD;  Location: [facility];  Service: Orthopedics;  Laterality: Left;  IRRIGATION  AND  DEBRIDEMENT  LEFT  FOOT  . Amputation Left 12/20/2013    Procedure: AMPUTATION LEFT 5TH TRANSMETATARSAL;  Surgeon: Wylene Simmer, MD;  Location: [place];  Service: Orthopedics;  Laterality: Left;  . Achilles tendon lengthening Left 12/20/2013    Procedure: ACHILLES TENDON LENGTHENING;  Surgeon: Wylene Simmer, MD;  Location: [place];  Service: Orthopedics;  Laterality: Left;    Family History  Problem Relation Age of Onset  . Coronary artery disease    . Alcohol abuse      History   Social History  . Marital Status: Married    Spouse Name: N/A    Number of Children: N/A  . Years of Education: N/A   Occupational History  . retired [employer]   Social History Main Topics  . Smoking status: Former Smoker -- 1.00 packs/day for 30 years    Types: Cigarettes, Pipe    Quit date: 03/29/1985  . Smokeless tobacco: Never Used     Comment: quit in 1986  . Alcohol Use: No  . Drug Use: No  . Sexual Activity: Not Currently   Other Topics Concern  . Not on file   Social History Narrative  . No narrative on file    Current Outpatient Prescriptions  Medication Sig Dispense Refill  . aspirin EC 81 MG tablet Take 81 mg by mouth every morning.        . gabapentin (NEURONTIN) 300 MG capsule Take 300-600 mg by mouth 3 (three) times daily. Take 600 mg in the morning, 600 mg at midday, and 300 mg at bedtime.      . isosorbide mononitrate (IMDUR) 30 MG 24 hr tablet Take 30 mg by mouth daily.      . metFORMIN (GLUCOPHAGE) 500 MG tablet Take 500 mg by mouth 2 (two) times daily with a meal.       .  pravastatin (PRAVACHOL) 40 MG tablet Take 40 mg by mouth daily.      . vitamin B-12 (CYANOCOBALAMIN) 500 MCG tablet Take 1,000 mcg by mouth daily.       . nitroGLYCERIN (NITROSTAT) 0.4 MG SL tablet Place 0.4 mg under the tongue every 5 (five) minutes as needed for chest pain.       No current facility-administered medications for this visit.    Allergies  Allergen Reactions  . Losartan Other (See Comments)    Hyperkalemia > 7  . Azithromycin Itching  . Contrast Media [Iodinated Diagnostic Agents] Rash  . Doxycycline Itching       . Keflex [Cephalexin] Itching  . Macrodantin Itching  . Minocycline Hcl Itching  . Nitrofurantoin Itching  . Zithromax [Azithromycin Dihydrate] Itching      Review of Systems:   General:  decreased appetite, decreased energy, no weight gain, some weight loss, no  fever  Cardiac:  No  chest pain with exertion, no chest pain at rest, no SOB with  exertion, no resting SOB, no PND, no orthopnea, no palpitations, no arrhythmia, no atrial fibrillation, no LE edema, no dizzy spells, one episode of syncope  Respiratory:  no shortness of breath, no home oxygen, has productive cough, no dry cough, no bronchitis, no wheezing, no hemoptysis, no asthma, no pain with inspiration or cough, no sleep apnea, no CPAP at night  GI:   no difficulty swallowing, no reflux, no frequent heartburn, no hiatal hernia, no abdominal pain, no constipation, no diarrhea, no hematochezia, no hematemesis, no melena  GU:   no dysuria,  no frequency, no urinary tract infection, no hematuria, no enlarged prostate, no kidney stones, no kidney  disease  Vascular:  no pain suggestive of claudication, no pain in feet, has leg cramps, no varicose veins, no DVT, had non-healing foot ulcer on left resulting in transmet amputation.  Neuro:   no stroke, no TIA's, no seizures, no headaches, notemporary blindness one eye,  no slurred speech, no peripheral neuropathy, no chronic pain, no instability of gait, has memory/cognitive dysfunction  Musculoskeletal: no arthritis, no joint swelling, no myalgias, no difficulty walking, fairly good mobility   Skin:   intermittent rash with itching, no skin infections, no pressure sores or ulcerations  Psych:   no anxiety, no depression, no nervousness, no unusual recent stress  Eyes:   no blurry vision, no floaters, no recent vision changes,  wears glasses or contacts  ENT:   no hearing loss, no loose or painful teeth, no dentures, last saw dentist 05/2014  Hematologic:  no easy bruising, no abnormal bleeding, no clotting disorder, no frequent epistaxis  Endocrine:  has diabetes, does check CBG's at home           Physical Exam:   BP 97/61  Pulse 90  Ht 6' (1.829 m)  Wt 186 lb (84.369 kg)  BMI 25.22 kg/m2  SpO2 96%  General:  Elderly, well-appearing, in no distress  HEENT:  Unremarkable , NCAT, PERLA, EOMI, oropharynx clear  Neck:   no JVD, no bruits, no adenopathy, carotid pulses normal with transmitted murmur bilaterally.  Chest:   clear to auscultation, symmetrical breath sounds, no wheezes, no rhonchi   CV:   RRR, grade III/VI crescendo/decrescendo murmur heard best at RSB,  no diastolic murmur  Abdomen:  soft, non-tender, no masses or organomegaly  Extremities:  warm, well-perfused, pulses no palpable, no LE edema, left transmet amputation.  Rectal/GU  Deferred  Neuro:   Grossly non-focal and  symmetrical throughout  Skin:   Clean and dry, no rashes, no breakdown   Diagnostic Tests:  *[place]* *[facility]* [address],   [id number] [phone]  ------------------------------------------------------------------- Transthoracic Echocardiography  Patient: [name], [name] MR #: [id number] Study Date: 07/22/2014 Gender: M Age: 46 Height: 182.9 cm Weight: 82.3 kg BSA: 2.05 m^2 Pt. Status: Room: [place], [place], MD SONOGRAPHER [facility], RDCS PERFORMING [facility], Inpatient  cc:  ------------------------------------------------------------------- LV EF: 55% - 60%  ------------------------------------------------------------------- Indications: Aortic stenosis 424.1.  ------------------------------------------------------------------- History: PMH: Syncope and dyspnea. Coronary artery disease. Stroke. Risk factors: Hypertension. Diabetes mellitus. Dyslipidemia.  ------------------------------------------------------------------- Study Conclusions  - Left ventricle: The cavity size was normal. Wall thickness was increased in a pattern of mild LVH. Systolic function was normal. The estimated ejection fraction was in the range of 55% to 60%. Images were inadequate for LV wall motion assessment. Doppler parameters are consistent with abnormal left ventricular relaxation (grade 1 diastolic dysfunction). The E/e&' ratio is between 8-15, suggesting indeterminate LV filling pressure. - Aortic valve: Moderately calcified leafelts with reduced excursion. There is severe aortic stenosis. Peak and mean gradients of 57 mmHg and 42 mmHg, respectively. Based on an LVOT diameter of 2.0 cm, the calculated AVA is 0.8-0.9 cm2. There was trivial regurgitation. Valve area (VTI): 0.84 cm^2. Valve area (Vmax): 0.86 cm^2. - Aorta: Aortic root dimension: 42 mm (ED). - Aortic root: The aortic root is mildly dilated. - Mitral valve: Moderate posterior MAC. There was mild regurgitation. - Left atrium: Severely dilated (37 cm2). - Right atrium:  Severely dilated. - Tricuspid valve: There was mild regurgitation. - Pulmonary arteries: PA peak pressure: 30 mm Hg (S). - Inferior vena cava: The vessel was normal in size. The respirophasic diameter changes were in the normal range (= 50%), consistent with normal central venous pressure.  Impressions:  - Compared to the prior echo in 3.2015, the EF has normalized. The aortic valve gradient has increased as well and the mean gradient is around 40 mmHg. This suggests &quot;true&quot; aortic stenosis, which is severe - AVA of 0.8-0.9 cm2.  ------------------------------------------------------------------- Labs, prior tests, procedures, and surgery: ECG. Abnormal. Transthoracic echocardiography. M-mode, complete 2D, spectral Doppler, and color Doppler. Birthdate: Patient birthdate: July 07, 1927. Age: Patient is 80 yr old. Sex: Gender: male. BMI: 24.6 kg/m^2. Blood pressure: 136/60 Patient status: Inpatient. Study date: Study date: 07/22/2014. Study time: 10:15 AM. Location: Echo laboratory.  -------------------------------------------------------------------  ------------------------------------------------------------------- Left ventricle: The cavity size was normal. Wall thickness was increased in a pattern of mild LVH. Systolic function was normal. The estimated ejection fraction was in the range of 55% to 60%. Images were inadequate for LV wall motion assessment. Doppler parameters are consistent with abnormal left ventricular relaxation (grade 1 diastolic dysfunction). The E/e&' ratio is between 8-15, suggesting indeterminate LV filling pressure.  ------------------------------------------------------------------- Aortic valve: Moderately calcified leafelts with reduced excursion. There is severe aortic stenosis. Peak and mean gradients of 57 mmHg and 42 mmHg, respectively. Based on an LVOT diameter of 2.0 cm, the calculated AVA is 0.8-0.9 cm2. Doppler: There was trivial  regurgitation. Valve area (VTI): 0.84 cm^2. Indexed valve area (VTI): 0.41 cm^2/m^2. Valve area (Vmax): 0.86 cm^2. Indexed valve area (Vmax): 0.42 cm^2/m^2. Mean gradient (S): 43 mm Hg. Peak gradient (S): 57 mm Hg.  ------------------------------------------------------------------- Aorta: Aortic root: The aortic root is mildly dilated. Ascending aorta: The ascending aorta was normal in size.  ------------------------------------------------------------------- Mitral valve: Moderate posterior MAC. Doppler: There  was mild regurgitation. Peak gradient (D): 2 mm Hg.  ------------------------------------------------------------------- Left atrium: Severely dilated (37 cm2).  ------------------------------------------------------------------- Right ventricle: The cavity size was normal. Wall thickness was normal. Systolic function was normal.  ------------------------------------------------------------------- Pulmonic valve: The valve appears to be grossly normal. Doppler: There was no significant regurgitation.  ------------------------------------------------------------------- Tricuspid valve: Doppler: There was mild regurgitation.  ------------------------------------------------------------------- Pulmonary artery: The main pulmonary artery was normal-sized.  ------------------------------------------------------------------- Right atrium: Severely dilated.  ------------------------------------------------------------------- Pericardium: There was no pericardial effusion.  ------------------------------------------------------------------- Systemic veins: Inferior vena cava: The vessel was normal in size. The respirophasic diameter changes were in the normal range (= 50%), consistent with normal central venous pressure.  ------------------------------------------------------------------- Measurements  Left ventricle Value 02/07/2014 Reference LV ID, ED, PLAX 50 mm 49.9  43 - 52 chordal LV ID, ES, PLAX (H) 47.5 mm 40.1 23 - 38 chordal LV fx shortening, PLAX (L) 5 % 20 >=29 chordal LV PW thickness, ED 11.7 mm 15.7 --------- IVS/LV PW ratio, ED 1.02 0.79 <=1.3 Stroke volume, 2D 71 ml ---------- --------- Stroke volume/bsa, 2D 35 ml/m^2 ---------- --------- LV e&', lateral 7.02 cm/s 7.57 --------- LV E/e&', lateral 10.61 7.6 --------- LV e&', medial 7.24 cm/s 6.25 --------- LV E/e&', medial 10.29 9.2 --------- LV e&', average 7.13 cm/s ---------- --------- LV E/e&', average 10.45 ---------- ---------  Ventricular septum Value 02/07/2014 Reference IVS thickness, ED 11.9 mm 12.4 ---------  LVOT Value 02/07/2014 Reference LVOT ID, S 20 mm 21 --------- LVOT area 3.14 cm^2 3.46 ---------  Aortic valve Value 02/07/2014 Reference Aortic valve peak 379 cm/s 350 --------- velocity, S Aortic valve mean 317 cm/s 282 --------- velocity, S Aortic valve VTI, S 85.8 cm 83.9 --------- Aortic mean gradient, 43 mm Hg 35 --------- S Aortic peak gradient, 57 mm Hg 49 --------- S Aortic valve area, VTI 0.84 cm^2 1.02 --------- Aortic valve area/bsa, 0.41 cm^2/m^2 0.5 --------- VTI Aortic valve area, 0.86 cm^2 1.07 --------- peak velocity Aortic valve area/bsa, 0.42 cm^2/m^2 0.52 --------- peak velocity  Aorta Value 02/07/2014 Reference Aortic root ID, ED 42 mm 38 ---------  Left atrium Value 02/07/2014 Reference LA ID, A-P, ES 37 mm 48 --------- LA ID/bsa, A-P 1.8 cm/m^2 2.35 <=2.2  Mitral valve Value 02/07/2014 Reference Mitral E-wave peak 74.5 cm/s 57.5 --------- velocity Mitral A-wave peak 104 cm/s 104 --------- velocity Mitral deceleration 158 ms 326 150 - 230 time Mitral peak gradient, 2 mm Hg ---------- --------- D Mitral E/A ratio, peak 0.7 0.6 ---------  Pulmonary arteries Value 02/07/2014 Reference PA pressure, S, DP 30 mm Hg ---------- <=30  Tricuspid valve Value 02/07/2014 Reference Tricuspid regurg peak 261 cm/s 251  --------- velocity Tricuspid peak RV-RA 27 mm Hg 25 --------- gradient Tricuspid maximal 261 cm/s ---------- --------- regurg velocity, PISA  Systemic veins Value 02/07/2014 Reference Estimated CVP 3 mm Hg 3 ---------  Right ventricle Value 02/07/2014 Reference RV pressure, S, DP 30 mm Hg 28 <=30 RV s&', lateral, S 12.8 cm/s 14.3 ---------  Legend: (L) and (H) mark values outside specified reference range.  ------------------------------------------------------------------- Prepared and Electronically Authenticated by  Lyman Bishop MD 2015-08-17T11:37:22   Cardiac Catheterization Procedure Note  Name: [name]  MRN: [id number]  DOB: 1927/01/20  Procedure: Right Heart Cath, Left Heart Cath, Selective Coronary Angiography, LIMA angiography, saphenous vein graft angiography, LV angiography  Indication: Severe symptomatic aortic stenosis, coronary artery disease status post remote CABG and PCI.  Procedural Details: The right groin was prepped, draped, and anesthetized with 1% lidocaine. Using the modified Seldinger technique a 7  French sheath was placed in the right femoral vein and a 5 French sheath was placed in the right femoral artery. A Swan-Ganz catheter was used for the right heart catheterization. Standard protocol was followed for recording of right heart pressures and sampling of oxygen saturations. Fick cardiac output was calculated. Standard Judkins catheters were used for selective coronary angiography and left ventriculography. His saphenous vein graft angiography was performed with a JR 4 catheter for the sequence graft to the left circumflex branches and an RCB cath for the saphenous vein graft to RCA. The LIMA was injected with the JR 4 catheter. The aortic valve was crossed using an AL2 (AL-1 would probably fit better) catheter and straight-tip wire. There were no immediate procedural complications. The patient was transferred to the post catheterization recovery  area for further monitoring.  Procedural Findings:  Hemodynamics  RA 2  RV 25/2  PA 21/6 mean 10  PCWP mean 6  LV 173/10  AO 148/61 mean 95  Oxygen saturations:  PA 75  AO 98  Cardiac Output (Fick) 6.8  Cardiac Index (Fick) 3.3  Aortic valve hemodynamics:  Peak peak gradient 25, mean gradient 23, aortic valve area 1.3 cm    Coronary angiography:  Coronary dominance: right  Left mainstem: 100% occlusion  Left anterior descending (LAD): Totally occluded from the native circulation. Fills entirely from the LIMA graft.  Left circumflex (LCx): Totally occluded from the native circulation. Fills entirely from the sequence graft to OM1 and OM 2  Right coronary artery (RCA): Severely calcified. The vessel has long segment 70% stenosis in its midportion. It is 100% occluded in the distal portion.  Saphenous vein graft to PDA: The ostium and proximal portion of the graft is stented. The stented segment has sequential 95% stenoses. The remaining portions of the bypass graft are patent with mild diffuse disease noted in the distal body of the vein graft. The PDA insertion site appears patent. The PLA and distal RCA filled retrograde from graft flow.  Saphenous vein graft sequential to OM1 and OM 2: Widely patent throughout. There are 3 major OM branches filling from this graft.  LIMA to LAD: Widely patent throughout. The distal anastomosis is widely patent. The proximal LAD fills retrograde.  Left ventriculography: LV function appears low normal, with an LVEF estimated at 50-55%. The mitral annulus is severely calcified. The aortic valve is severely calcified.  Left ventriculography: Left ventricular systolic function is normal, LVEF is estimated at 55-65%, there is no significant mitral regurgitation  Estimated Blood Loss: Minimal  Final Conclusions:  1. Severe native three-vessel disease with total occlusion of the RCA and total occlusion of the left main  2. Status post aortocoronary bypass  surgery with continued patency of the LIMA to LAD and sequential saphenous vein graft to OM1 and OM 2  3. Severe in-stent restenosis in the saphenous vein graft to PDA  4. Low normal LV systolic function with normal right heart cardiac filling pressures  5. Moderate to severe aortic stenosis  Recommendations: The patient's aortic stenosis is severe by echo criteria. His valve is heavily calcified with restricted opening of all leaflets. He also has high-grade obstructive disease in the stented segment of the RCA graft. Will refer to Dr. Cyndia Bent for consideration of further treatment options, which includes TAVR/PCI versus redo CABG/AVR  [name] MD, [facility]  09/23/2014, 8:46 AM    STS Risk Calculator  Risk of Mortality: 5.582%  Morbidity or Mortality: 27.606% Long Length of Stay: 11.991% Short  Length of Stay: 17.95% Permanent Stroke: 3.127% Prolonged Ventilation: 17.396% DSW Infection: 0.655% Renal Failure: 9.099% Reoperation: 11.135%     Impression:  He has severe symptomatic aortic stenosis presenting with an episode of syncope while sitting in church. He is still fairly functional but has been noted to have decreased exertional tolerance over the past 6 months and some short term memory loss. He has had previous CABG in 1995 and most of his grafts look good with the exception of in-stent restenosis in the proximal portion of his PDA vein graft. I think aortic valve replacement is indicated in this gentleman while he is still functional and before he has further syncopal episodes. I think the episode he had was most likely related to the severe aortic stenosis but obviously taking a NTG tablet after the symptoms started likely resulted in frank syncope. The STS risk calculator estimates his operative mortality at 5.6% but I think it is probably significantly higher given his age and the redo status. He has been treated for Mantle cell lymphoma within the last 2 years and has undergone  multiple left foot surgeries for diabetic wound complications with osteomyelitis. He would have a very difficult time recovering from conventional open surgical AVR and CABG and would probably not get back to his previous functional state. I think TAVR and PCI of the PDA vein graft stenosis is a better option for him with less operative risk and a much better chance of returning to functional status. I reviewed the options for treatment with him and his wife including TAVR/PCI, open surgical AVR and CABG, and palliative medical care. They would like to proceed with TAVR workup. He says that he would not go through open surgical AVR and CABG at his age and if TAVR is no possible then he would continue medical therapy.   Plan:  He will be scheduled for a gated cardiac CT and CTA of the chest, abdomen, and pelvis. He will have PFT's and physical therapy evaluation and will return to see me after these are done. If these studies show that TAVR is a consideration, then he will be scheduled to see Dr. Roxy Manns for a second surgical opinion.    Gaye Pollack, MD 10/04/2014

## 2014-10-07 ENCOUNTER — Telehealth: Payer: Self-pay

## 2014-10-07 MED ORDER — PREDNISONE 50 MG PO TABS
ORAL_TABLET | ORAL | Status: DC
Start: 2014-10-07 — End: 2014-10-10

## 2014-10-07 NOTE — Telephone Encounter (Signed)
IVP dye pre medication called to pharm, pt aware

## 2014-10-08 ENCOUNTER — Ambulatory Visit (HOSPITAL_COMMUNITY): Payer: Medicare Other

## 2014-10-08 ENCOUNTER — Ambulatory Visit (HOSPITAL_COMMUNITY)
Admission: RE | Admit: 2014-10-08 | Discharge: 2014-10-08 | Disposition: A | Discharge status: Home / Self Care | Payer: Medicare Other | Source: Ambulatory Visit | Attending: Surgery | Admitting: Surgery

## 2014-10-08 ENCOUNTER — Encounter (HOSPITAL_COMMUNITY): Payer: Self-pay

## 2014-10-08 DIAGNOSIS — Z951 Presence of aortocoronary bypass graft: Secondary | ICD-10-CM | POA: Diagnosis not present

## 2014-10-08 DIAGNOSIS — I350 Nonrheumatic aortic (valve) stenosis: Secondary | ICD-10-CM

## 2014-10-08 DIAGNOSIS — F17210 Nicotine dependence, cigarettes, uncomplicated: Secondary | ICD-10-CM | POA: Insufficient documentation

## 2014-10-08 DIAGNOSIS — Z01818 Encounter for other preprocedural examination: Secondary | ICD-10-CM | POA: Insufficient documentation

## 2014-10-08 DIAGNOSIS — R0609 Other forms of dyspnea: Secondary | ICD-10-CM | POA: Insufficient documentation

## 2014-10-08 LAB — PULMONARY FUNCTION TEST
DL/VA % pred: 82 %
DL/VA: 3.87 ml/min/mmHg/L
DLCO COR % PRED: 74 %
DLCO UNC % PRED: 74 %
DLCO cor: 26.17 ml/min/mmHg
DLCO unc: 26.17 ml/min/mmHg
FEF 25-75 Post: 1.74 L/sec
FEF 25-75 Pre: 1.44 L/sec
FEF2575-%Change-Post: 20 %
FEF2575-%PRED-POST: 90 %
FEF2575-%PRED-PRE: 75 %
FEV1-%CHANGE-POST: 6 %
FEV1-%PRED-PRE: 84 %
FEV1-%Pred-Post: 89 %
FEV1-PRE: 2.46 L
FEV1-Post: 2.61 L
FEV1FVC-%Change-Post: 2 %
FEV1FVC-%Pred-Pre: 96 %
FEV6-%CHANGE-POST: 3 %
FEV6-%PRED-POST: 97 %
FEV6-%Pred-Pre: 94 %
FEV6-POST: 3.76 L
FEV6-Pre: 3.64 L
FEV6FVC-%CHANGE-POST: 0 %
FEV6FVC-%PRED-PRE: 107 %
FEV6FVC-%Pred-Post: 106 %
FVC-%CHANGE-POST: 3 %
FVC-%PRED-POST: 91 %
FVC-%PRED-PRE: 87 %
FVC-POST: 3.79 L
FVC-Pre: 3.65 L
POST FEV1/FVC RATIO: 69 %
PRE FEV1/FVC RATIO: 67 %
Post FEV6/FVC ratio: 99 %
Pre FEV6/FVC Ratio: 100 %
RV % pred: 120 %
RV: 3.47 L
TLC % pred: 99 %
TLC: 7.46 L

## 2014-10-08 MED ORDER — ALBUTEROL SULFATE (2.5 MG/3ML) 0.083% IN NEBU
2.5000 mg | INHALATION_SOLUTION | Freq: Once | RESPIRATORY_TRACT | Status: AC
  Administered 2014-10-08: 2.5 mg via RESPIRATORY_TRACT

## 2014-10-08 MED ORDER — METOPROLOL TARTRATE 1 MG/ML IV SOLN
5.0000 mg | INTRAVENOUS | Status: DC | PRN
  Administered 2014-10-08: 5 mg via INTRAVENOUS
  Administered 2014-10-08: 2.5 mg via INTRAVENOUS
  Filled 2014-10-08 (×2): qty 5

## 2014-10-08 MED ORDER — IOHEXOL 350 MG/ML SOLN
80.0000 mL | Freq: Once | INTRAVENOUS | Status: AC | PRN
  Administered 2014-10-08: 80 mL via INTRAVENOUS

## 2014-10-08 MED ORDER — METOPROLOL TARTRATE 1 MG/ML IV SOLN
INTRAVENOUS | Status: AC
  Filled 2014-10-08: qty 10

## 2014-10-08 NOTE — Progress Notes (Signed)
Premedicated for contrast allergy from home med Rx supply.  Prednisone 50 mg and Benadryl 50 mg.  Tok 13 hr prep at home completed last dose here.

## 2014-10-08 NOTE — Progress Notes (Signed)
Glucophage hold instructions given to pt and wife post CT heart.  Tolerated well. VSS.  Reminded to watch for allergic reaction to contrast dye, premeds were given.  Dr Meda Coffee present during scan.

## 2014-10-10 ENCOUNTER — Ambulatory Visit (INDEPENDENT_AMBULATORY_CARE_PROVIDER_SITE_OTHER): Payer: Medicare Other | Admitting: Surgery

## 2014-10-10 ENCOUNTER — Encounter: Payer: Self-pay | Admitting: Surgery

## 2014-10-10 ENCOUNTER — Telehealth: Payer: Self-pay | Admitting: Cardiovascular Disease

## 2014-10-10 VITALS — BP 132/63 | HR 60 | Ht 72.0 in | Wt 186.0 lb

## 2014-10-10 DIAGNOSIS — I350 Nonrheumatic aortic (valve) stenosis: Secondary | ICD-10-CM

## 2014-10-10 DIAGNOSIS — I6529 Occlusion and stenosis of unspecified carotid artery: Secondary | ICD-10-CM

## 2014-10-10 MED ORDER — FAMOTIDINE 20 MG PO TABS: ORAL_TABLET | ORAL | Status: DC

## 2014-10-10 MED ORDER — PREDNISONE 20 MG PO TABS: ORAL_TABLET | ORAL | Status: DC

## 2014-10-10 MED ORDER — CLOPIDOGREL BISULFATE 75 MG PO TABS: ORAL_TABLET | ORAL | Status: DC

## 2014-10-10 NOTE — Telephone Encounter (Signed)
New problem   Pt is returning a call from Dr Burt Knack.

## 2014-10-11 ENCOUNTER — Encounter: Payer: Self-pay | Admitting: Surgery

## 2014-10-11 ENCOUNTER — Telehealth: Payer: Self-pay | Admitting: *Deleted

## 2014-10-11 NOTE — Telephone Encounter (Signed)
I spoke with the patient's wife and clarified his prednisone dosing with her for the patient's cath.

## 2014-10-11 NOTE — Telephone Encounter (Signed)
Dr Burt Knack spoke with the pt about proceeding with cardiac catheterization.  Procedure scheduled and I gave the pt's wife pre-procedure instructions by phone.

## 2014-10-11 NOTE — Telephone Encounter (Signed)
Patients wife, Marlowe Kays, called and would like a return call from a nurse to discuss the prednisone rx that was sent in. She can be reached at [phone]. Please advise. Thanks, MI

## 2014-10-11 NOTE — Progress Notes (Signed)
HPI:  [name] returns today to discuss the results of his CT scans for TAVR evaluation. He had these done on Tuesday this week and says that he had an episode of severe substernal chest pain after these were completed and he took a NTG tablet with resolution. His wife says he looked exhausted when he got home and had another episode of the same severe pain early in the evening while lying down that was relieved with a SL NTG. He has had no further episodes. Those were the first episodes of chest pain that he has had since his prior PCI. He has not had any further syncope but occasionally gets dizzy when he stands up. He has not been doing very much.  Current Outpatient Prescriptions  Medication Sig Dispense Refill  . aspirin EC 81 MG tablet Take 81 mg by mouth every morning.     . gabapentin (NEURONTIN) 300 MG capsule Take 300-600 mg by mouth 3 (three) times daily. Take 600 mg in the morning, 600 mg at midday, and 300 mg at bedtime.    . isosorbide mononitrate (IMDUR) 30 MG 24 hr tablet Take 30 mg by mouth daily.    . metFORMIN (GLUCOPHAGE) 500 MG tablet Take 500 mg by mouth 2 (two) times daily with a meal.     . pravastatin (PRAVACHOL) 40 MG tablet Take 40 mg by mouth daily.    . vitamin B-12 (CYANOCOBALAMIN) 500 MCG tablet Take 1,000 mcg by mouth daily.     . clopidogrel (PLAVIX) 75 MG tablet Take 4 tablets by mouth the first day and then decrease to one tablet by mouth daily 34 tablet 0  . famotidine (PEPCID) 20 MG tablet Take as directed prior to cardiac catherization 2 tablet 0  . nitroGLYCERIN (NITROSTAT) 0.4 MG SL tablet Place 0.4 mg under the tongue every 5 (five) minutes as needed for chest pain.    . predniSONE (DELTASONE) 20 MG tablet Take as directed prior to cardiac catheterization 9 tablet 0   No current facility-administered medications for this visit.     Physical Exam: BP 132/63 mmHg  Pulse 60  Ht 6' (1.829 m)  Wt 186 lb (84.369 kg)  BMI 25.22 kg/m2  SpO2  96% He looks comfortable. Lungs are clear Cardiac exam shows a RRR, grade III/VI crescendo/decrescendo murmur heard best at RSB, no diastolic murmur There is no peripheral edema   Diagnostic Tests:  ADDENDUM REPORT: 10/09/2014 12:02  CLINICAL DATA: Aortic stenosis  EXAM: Cardiac TAVR CT  TECHNIQUE: The patient was scanned on a Philips 256 scanner. A 120 kV retrospective scan was triggered in the descending thoracic aorta at 111 HU's. Gantry rotation speed was 270 msecs and collimation was .9 mm. 7.5 mg of iv Metoprolol and no nitro were given. The 3D data set was reconstructed in 5% intervals of the R-R cycle. Systolic and diastolic phases were analyzed on a dedicated work station using MPR, MIP and VRT modes. The patient received 80 cc of contrast.  FINDINGS: Aortic Valve: Trileaflet, moderately calcified and thickened with severely reduced leaflet opening.  Aorta: Mildly diffusely calcified, no dissection, no aneurysm.  Sinotubular Junction: 31 x 30 mm  Ascending Thoracic Aorta: 32 x 31 mm  Aortic Arch: 30 x 38 mm  Descending Thoracic Aorta: 28 x 28 mm  Sinus of Valsalva Measurements:  Non-coronary: 37 mm  Right -coronary: 37 mm  Left -coronary: 38 mm  Coronary Artery Height above Annulus:  Left Main: 13 mm  Right Coronary: 17 mm  Virtual Basal Annulus Measurements:  Maximum/Minimum Diameter: 31 x 26 mm  Perimeter: 99 mm  Area: 618 mm2  Coronary Arteries: The study wasn't intended for coronary evaluation as performed without use of NTG.  Optimum Fluoroscopic Angle for Delivery: RAO 0 CRA 0  Sheath to tip distance: 63 mm  IMPRESSION: 1. Trileaflet, moderately calcified and thickened with severely reduced leaflet opening with annular measurements suitable for delivery of 29 mm Edward-SAPIEN XT valve.  2. Optimum Fluoroscopic Angle for Delivery: RAO 0 CRA 0.  3. Sufficient coronary to annular  distance.  [name]   Electronically Signed  By: [name]  On: 10/09/2014 12:02   CLINICAL DATA: 80 year old male with history of severe aortic stenosis. Preprocedural study prior to potential transcatheter aortic valve replacement (TAVR) procedure.  EXAM: CTA ABDOMEN AND PELVIS WITH CONTRAST  TECHNIQUE: Multidetector CT imaging of the abdomen and pelvis was performed using the standard protocol during bolus administration of intravenous contrast. Multiplanar reconstructed images and MIPs were obtained and reviewed to evaluate the vascular anatomy.  CONTRAST: 76mL OMNIPAQUE IOHEXOL 350 MG/ML SOLN  COMPARISON: CT of the chest, abdomen and pelvis 07/02/2013.  FINDINGS: CT ABDOMEN AND PELVIS FINDINGS  Lower chest: Small amount of scarring in the lung bases bilaterally. Mild cardiomegaly. Atherosclerotic calcifications in the distal left anterior descending and right coronary arteries. Severe calcifications of the mitral annulus. Postoperative changes of CABG are noted, with a saphenous vein graft to the distal RCA noted posterior to the right atrium.  Hepatobiliary: The liver has a slightly shrunken appearance and nodular contour, with mild caudate lobe hypertrophy, suggestive of early changes of cirrhosis. No focal cystic or solid hepatic lesions. Specifically, no definite hypervascular hepatic lesion. Calcified granuloma in segment 8 of the liver incidentally noted. No intra or extrahepatic biliary ductal dilatation. Gallbladder is normal in appearance.  Pancreas: Unremarkable.  Spleen: The spleen is enlarged measuring 13.4 x 7.7 x 16.3 cm (estimated splenic volume of 841 mL).  Adrenals/Urinary Tract: Several sub cm low-attenuation lesions are noted in the kidneys bilaterally (left greater than right), too small to characterize, but favored to represent small cysts. No hydroureteronephrosis or perinephric stranding to indicate  urinary tract obstruction at this time. Urinary bladder is normal in appearance. Bilateral adrenal glands are normal in appearance.  Stomach/Bowel: Normal appearance of the stomach. No pathologic dilatation of small bowel or colon. Large diverticulum from the third portion of the duodenum incidentally noted, measuring approximately 4.3 cm in diameter; no surrounding inflammation to suggest associated diverticulitis. There are numerous colonic diverticulae, particularly in the distal descending colon and proximal sigmoid colon, without surrounding inflammatory changes to suggest an diverticulitis at this time. Normal appendix (retrocecal in position).  Vascular/Lymphatic: Findings and measurements pertinent to potential TAVR procedure, as detailed below. Incidental note is also made of an accessory renal artery to the lower pole of the left kidney. Single right renal artery. 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 Portal vein is mildly dilated measuring up to 17 mm in diameter. Immediately inferior to the mid transverse colon (image 52 of series 401) there is a 1.1 x 1.8 cm soft tissue attenuation lesion, favored to represent a mildly enlarged lymph node. In retrospect, this is unchanged compared to prior study 07/02/2013, and favored to be benign. No other pathologically enlarged lymph nodes are noted in the abdomen or pelvis.  Reproductive: Prostate gland is unremarkable in appearance.  Other: No significant volume of ascites. No pneumoperitoneum.  Musculoskeletal: There are no aggressive appearing lytic or blastic lesions noted in the visualized portions of the skeleton.  VASCULAR MEASUREMENTS PERTINENT TO TAVR:  AORTA:  Minimal Aortic Diameter - 13 x 14 mm  Severity of Aortic Calcification - moderate  RIGHT PELVIS:  Right Common Iliac Artery -  Minimal Diameter - 8.1 x 7.7 mm  Tortuosity - mild  Calcification -  moderate  Right External Iliac Artery -  Minimal Diameter - 7.6 x 7.2 mm  Tortuosity - moderate  Calcification - none  Right Common Femoral Artery -  Minimal Diameter - 8.4 x 7.7 mm  Tortuosity - mild  Calcification - mild  LEFT PELVIS:  Left Common Iliac Artery -  Minimal Diameter - 8.4 x 9.9 mm  Tortuosity - mild  Calcification - mild  Left External Iliac Artery -  Minimal Diameter - 9.0 x 7.9 mm  Tortuosity - moderate  Calcification - minimal  Left Common Femoral Artery -  Minimal Diameter - 7.3 x 6.2 mm  Tortuosity - mild  Calcification - mild  Review of the MIP images confirms the above findings.  IMPRESSION: 1. Vascular findings and measurements pertinent to potential TAVR procedure, as detailed above. This patient does appear to have suitable pelvic arterial access. 2. The appearance of the liver is compatible with underlying cirrhosis. No hypervascular lesion is noted at this time to suggest the presence of a hepatocellular carcinoma. However, there is stigmata of portal hypertension, including mild portal vein dilatation and splenomegaly. 3. Additional incidental findings, as detailed above.   Electronically Signed  By: Vinnie Langton M.D.  On: 10/08/2014 14:29   Pulmonary Function Testing  10/08/2014   Ref Range 3d ago    FVC-Pre L 3.65   FVC-%Pred-Pre % 87   FVC-Post L 3.79   FVC-%Pred-Post % 91   FVC-%Change-Post % 3   FEV1-Pre L 2.46   FEV1-%Pred-Pre % 84   FEV1-Post L 2.61   FEV1-%Pred-Post % 89   FEV1-%Change-Post % 6   FEV6-Pre L 3.64   FEV6-%Pred-Pre % 94   FEV6-Post L 3.76   FEV6-%Pred-Post % 97   FEV6-%Change-Post % 3   Pre FEV1/FVC ratio % 67   FEV1FVC-%Pred-Pre % 96   Post FEV1/FVC ratio % 69   FEV1FVC-%Change-Post % 2   Pre FEV6/FVC Ratio % 100   FEV6FVC-%Pred-Pre % 107   Post FEV6/FVC ratio % 99   FEV6FVC-%Pred-Post % 106   FEV6FVC-%Change-Post % 0    FEF 25-75 Pre L/sec 1.44   FEF2575-%Pred-Pre % 75   FEF 25-75 Post L/sec 1.74   FEF2575-%Pred-Post % 90   FEF2575-%Change-Post % 20   RV L 3.47   RV % pred % 120   TLC L 7.46   TLC % pred % 99   DLCO unc ml/min/mmHg 26.17   DLCO unc % pred % 74   DLCO cor ml/min/mmHg 26.17   DLCO cor % pred % 74   DL/VA ml/min/mmHg/L 3.87   DL/VA % pred % 82     Impression:  Gated cardiac CT shows that his annular dimension is suitable for a 29 mm Sapient XT valve. His pelvic arterial access is suitable for a transfemoral approach. I am concerned about his episodes of severe chest pain on Tuesday because he has a high grade in-stent restenosis in the proximal portion of the PDA vein graft and has not had pain before. I discussed this with Dr. Burt Knack and he will plan to see the patient and proceed with PCI of  this restenosis next week. We will then have him see Dr. Roxy Manns for a second surgical opinion for TAVR and if he feels that it is the best treatment option we can proceed with TAVR after he has recovered from his PCI.  Plan:  1. Patient will follow up with Dr. Burt Knack and have PCI of his PDA vein graft restenosis, probably next week.  2. He will have a 2nd surgical opinion with Dr. Roxy Manns.  3. He is already scheduled for rehab evaluation.  4. TAVR depending on Dr. Guy Sandifer evaluation and recovery after PCI.

## 2014-10-14 ENCOUNTER — Encounter (HOSPITAL_COMMUNITY): Payer: Self-pay | Admitting: General Practice

## 2014-10-14 ENCOUNTER — Encounter (HOSPITAL_COMMUNITY)
Admission: RE | Disposition: A | Discharge status: Home / Self Care | Payer: Self-pay | Source: Ambulatory Visit | Attending: Cardiovascular Disease

## 2014-10-14 ENCOUNTER — Ambulatory Visit (HOSPITAL_COMMUNITY)
Admission: RE | Admit: 2014-10-14 | Discharge: 2014-10-15 | Disposition: A | Discharge status: Home / Self Care | Payer: Medicare Other | Source: Ambulatory Visit | Attending: Cardiovascular Disease | Admitting: Cardiovascular Disease

## 2014-10-14 DIAGNOSIS — C8310 Mantle cell lymphoma, unspecified site: Secondary | ICD-10-CM | POA: Insufficient documentation

## 2014-10-14 DIAGNOSIS — Z7982 Long term (current) use of aspirin: Secondary | ICD-10-CM | POA: Diagnosis not present

## 2014-10-14 DIAGNOSIS — I10 Essential (primary) hypertension: Secondary | ICD-10-CM | POA: Diagnosis present

## 2014-10-14 DIAGNOSIS — I2089 Other forms of angina pectoris: Secondary | ICD-10-CM | POA: Diagnosis present

## 2014-10-14 DIAGNOSIS — Y831 Surgical operation with implant of artificial internal device as the cause of abnormal reaction of the patient, or of later complication, without mention of misadventure at the time of the procedure: Secondary | ICD-10-CM | POA: Diagnosis not present

## 2014-10-14 DIAGNOSIS — I209 Angina pectoris, unspecified: Secondary | ICD-10-CM | POA: Diagnosis present

## 2014-10-14 DIAGNOSIS — I350 Nonrheumatic aortic (valve) stenosis: Secondary | ICD-10-CM | POA: Insufficient documentation

## 2014-10-14 DIAGNOSIS — R55 Syncope and collapse: Secondary | ICD-10-CM | POA: Diagnosis not present

## 2014-10-14 DIAGNOSIS — J449 Chronic obstructive pulmonary disease, unspecified: Secondary | ICD-10-CM | POA: Insufficient documentation

## 2014-10-14 DIAGNOSIS — Z951 Presence of aortocoronary bypass graft: Secondary | ICD-10-CM | POA: Diagnosis not present

## 2014-10-14 DIAGNOSIS — E119 Type 2 diabetes mellitus without complications: Secondary | ICD-10-CM | POA: Insufficient documentation

## 2014-10-14 DIAGNOSIS — T82858A Stenosis of vascular prosthetic devices, implants and grafts, initial encounter: Secondary | ICD-10-CM | POA: Insufficient documentation

## 2014-10-14 DIAGNOSIS — I25119 Atherosclerotic heart disease of native coronary artery with unspecified angina pectoris: Secondary | ICD-10-CM | POA: Insufficient documentation

## 2014-10-14 DIAGNOSIS — I6529 Occlusion and stenosis of unspecified carotid artery: Secondary | ICD-10-CM | POA: Insufficient documentation

## 2014-10-14 DIAGNOSIS — E785 Hyperlipidemia, unspecified: Secondary | ICD-10-CM | POA: Diagnosis present

## 2014-10-14 DIAGNOSIS — I2510 Atherosclerotic heart disease of native coronary artery without angina pectoris: Secondary | ICD-10-CM | POA: Diagnosis present

## 2014-10-14 DIAGNOSIS — Z79899 Other long term (current) drug therapy: Secondary | ICD-10-CM | POA: Insufficient documentation

## 2014-10-14 DIAGNOSIS — I25718 Atherosclerosis of autologous vein coronary artery bypass graft(s) with other forms of angina pectoris: Secondary | ICD-10-CM

## 2014-10-14 DIAGNOSIS — I252 Old myocardial infarction: Secondary | ICD-10-CM | POA: Diagnosis not present

## 2014-10-14 DIAGNOSIS — I739 Peripheral vascular disease, unspecified: Secondary | ICD-10-CM | POA: Diagnosis present

## 2014-10-14 DIAGNOSIS — I208 Other forms of angina pectoris: Secondary | ICD-10-CM | POA: Diagnosis present

## 2014-10-14 DIAGNOSIS — I779 Disorder of arteries and arterioles, unspecified: Secondary | ICD-10-CM | POA: Diagnosis present

## 2014-10-14 HISTORY — PX: PERCUTANEOUS CORONARY STENT INTERVENTION (PCI-S): SHX5485

## 2014-10-14 HISTORY — DX: Acute myocardial infarction, unspecified: I21.9

## 2014-10-14 HISTORY — DX: Pneumonia, unspecified organism: J18.9

## 2014-10-14 HISTORY — DX: Angina pectoris, unspecified: I20.9

## 2014-10-14 LAB — GLUCOSE, CAPILLARY
Glucose-Capillary: 215 mg/dL — ABNORMAL HIGH (ref 70–99)
Glucose-Capillary: 245 mg/dL — ABNORMAL HIGH (ref 70–99)
Glucose-Capillary: 208 mg/dL — ABNORMAL HIGH (ref 70–99)

## 2014-10-14 LAB — BASIC METABOLIC PANEL
ANION GAP: 13 (ref 5–15)
BUN: 25 mg/dL — ABNORMAL HIGH (ref 6–23)
CHLORIDE: 101 meq/L (ref 96–112)
CO2: 26 meq/L (ref 19–32)
CREATININE: 0.98 mg/dL (ref 0.50–1.35)
Calcium: 10.1 mg/dL (ref 8.4–10.5)
GFR calc Af Amer: 84 mL/min — ABNORMAL LOW (ref >90)
GFR calc non Af Amer: 73 mL/min — ABNORMAL LOW (ref >90)
Glucose, Bld: 288 mg/dL — ABNORMAL HIGH (ref 70–99)
Potassium: 5 mEq/L (ref 3.7–5.3)
SODIUM: 140 meq/L (ref 137–147)

## 2014-10-14 LAB — CBC
HCT: 36.9 % — ABNORMAL LOW (ref 39.0–52.0)
Hemoglobin: 12.5 g/dL — ABNORMAL LOW (ref 13.0–17.0)
MCH: 32.6 pg (ref 26.0–34.0)
MCHC: 33.9 g/dL (ref 30.0–36.0)
MCV: 96.1 fL (ref 78.0–100.0)
PLATELETS: 97 10*3/uL — AB (ref 150–400)
RBC: 3.84 MIL/uL — AB (ref 4.22–5.81)
RDW: 14.4 % (ref 11.5–15.5)
WBC: 3.8 10*3/uL — AB (ref 4.0–10.5)

## 2014-10-14 LAB — PROTIME-INR
INR: 1.07 (ref 0.00–1.49)
PROTHROMBIN TIME: 14 s (ref 11.6–15.2)

## 2014-10-14 LAB — POCT ACTIVATED CLOTTING TIME: Activated Clotting Time: 264 seconds

## 2014-10-14 SURGERY — PERCUTANEOUS CORONARY STENT INTERVENTION (PCI-S)

## 2014-10-14 MED ORDER — OXYCODONE-ACETAMINOPHEN 5-325 MG PO TABS: 1.0000 | ORAL_TABLET | ORAL | Status: DC | PRN

## 2014-10-14 MED ORDER — SODIUM CHLORIDE 0.9 % IV SOLN: 250.0000 mL | INTRAVENOUS | Status: DC | PRN

## 2014-10-14 MED ORDER — HEPARIN (PORCINE) IN NACL 2-0.9 UNIT/ML-% IJ SOLN
INTRAMUSCULAR | Status: AC
  Filled 2014-10-14: qty 1500

## 2014-10-14 MED ORDER — SODIUM CHLORIDE 0.9 % IV SOLN
INTRAVENOUS | Status: DC
  Administered 2014-10-14: 12:00:00 via INTRAVENOUS

## 2014-10-14 MED ORDER — ISOSORBIDE MONONITRATE ER 30 MG PO TB24
30.0000 mg | ORAL_TABLET | Freq: Every day | ORAL | Status: DC
  Administered 2014-10-15: 10:00:00 30 mg via ORAL
  Filled 2014-10-14: qty 1

## 2014-10-14 MED ORDER — INSULIN ASPART 100 UNIT/ML ~~LOC~~ SOLN
0.0000 [IU] | Freq: Three times a day (TID) | SUBCUTANEOUS | Status: DC
  Administered 2014-10-14: 19:00:00 5 [IU] via SUBCUTANEOUS
  Administered 2014-10-15: 08:00:00 2 [IU] via SUBCUTANEOUS

## 2014-10-14 MED ORDER — ASPIRIN EC 81 MG PO TBEC
81.0000 mg | DELAYED_RELEASE_TABLET | Freq: Every morning | ORAL | Status: DC
  Administered 2014-10-15: 10:00:00 81 mg via ORAL
  Filled 2014-10-14: qty 1

## 2014-10-14 MED ORDER — GABAPENTIN 300 MG PO CAPS
600.0000 mg | ORAL_CAPSULE | Freq: Two times a day (BID) | ORAL | Status: DC
  Administered 2014-10-14 – 2014-10-15 (×2): 600 mg via ORAL
  Filled 2014-10-14 (×3): qty 2

## 2014-10-14 MED ORDER — FAMOTIDINE 20 MG PO TABS: 20.0000 mg | ORAL_TABLET | ORAL | Status: DC

## 2014-10-14 MED ORDER — PRAVASTATIN SODIUM 40 MG PO TABS
40.0000 mg | ORAL_TABLET | Freq: Every day | ORAL | Status: DC
  Administered 2014-10-15: 10:00:00 40 mg via ORAL
  Filled 2014-10-14: qty 1

## 2014-10-14 MED ORDER — VERAPAMIL HCL 2.5 MG/ML IV SOLN
INTRAVENOUS | Status: AC
  Filled 2014-10-14: qty 2

## 2014-10-14 MED ORDER — SODIUM CHLORIDE 0.9 % IV SOLN: 1.0000 mL/kg/h | INTRAVENOUS | Status: AC

## 2014-10-14 MED ORDER — SODIUM CHLORIDE 0.9 % IJ SOLN: 3.0000 mL | INTRAMUSCULAR | Status: DC | PRN

## 2014-10-14 MED ORDER — CLOPIDOGREL BISULFATE 75 MG PO TABS
75.0000 mg | ORAL_TABLET | Freq: Every day | ORAL | Status: DC
  Administered 2014-10-15: 10:00:00 75 mg via ORAL
  Filled 2014-10-14: qty 1

## 2014-10-14 MED ORDER — NITROGLYCERIN 0.4 MG SL SUBL: 0.4000 mg | SUBLINGUAL_TABLET | SUBLINGUAL | Status: DC | PRN

## 2014-10-14 MED ORDER — NITROGLYCERIN 1 MG/10 ML FOR IR/CATH LAB
INTRA_ARTERIAL | Status: AC
  Filled 2014-10-14: qty 10

## 2014-10-14 MED ORDER — SODIUM CHLORIDE 0.9 % IJ SOLN
3.0000 mL | Freq: Two times a day (BID) | INTRAMUSCULAR | Status: DC
  Administered 2014-10-14: 3 mL via INTRAVENOUS

## 2014-10-14 MED ORDER — DIPHENHYDRAMINE HCL 25 MG PO CAPS: 25.0000 mg | ORAL_CAPSULE | ORAL | Status: DC

## 2014-10-14 MED ORDER — SODIUM CHLORIDE 0.9 % IJ SOLN: 3.0000 mL | Freq: Two times a day (BID) | INTRAMUSCULAR | Status: DC

## 2014-10-14 MED ORDER — GABAPENTIN 300 MG PO CAPS
300.0000 mg | ORAL_CAPSULE | Freq: Every day | ORAL | Status: DC
  Administered 2014-10-14: 23:00:00 300 mg via ORAL
  Filled 2014-10-14 (×2): qty 1

## 2014-10-14 MED ORDER — MIDAZOLAM HCL 2 MG/2ML IJ SOLN
INTRAMUSCULAR | Status: AC
  Filled 2014-10-14: qty 2

## 2014-10-14 MED ORDER — FENTANYL CITRATE 0.05 MG/ML IJ SOLN
INTRAMUSCULAR | Status: AC
  Filled 2014-10-14: qty 2

## 2014-10-14 MED ORDER — HEPARIN SODIUM (PORCINE) 1000 UNIT/ML IJ SOLN
INTRAMUSCULAR | Status: AC
  Filled 2014-10-14: qty 1

## 2014-10-14 MED ORDER — ACETAMINOPHEN 325 MG PO TABS: 650.0000 mg | ORAL_TABLET | ORAL | Status: DC | PRN

## 2014-10-14 MED ORDER — ASPIRIN 81 MG PO CHEW
81.0000 mg | CHEWABLE_TABLET | ORAL | Status: AC
  Administered 2014-10-14: 81 mg via ORAL

## 2014-10-14 MED ORDER — LIDOCAINE HCL (PF) 1 % IJ SOLN
INTRAMUSCULAR | Status: AC
  Filled 2014-10-14: qty 30

## 2014-10-14 MED ORDER — ONDANSETRON HCL 4 MG/2ML IJ SOLN: 4.0000 mg | Freq: Four times a day (QID) | INTRAMUSCULAR | Status: DC | PRN

## 2014-10-14 MED ORDER — ASPIRIN 81 MG PO CHEW
CHEWABLE_TABLET | ORAL | Status: AC
Start: 2014-10-14 — End: 2014-10-14
  Administered 2014-10-14: 81 mg via ORAL
  Filled 2014-10-14: qty 1

## 2014-10-14 MED ORDER — INSULIN ASPART 100 UNIT/ML ~~LOC~~ SOLN: 0.0000 [IU] | Freq: Three times a day (TID) | SUBCUTANEOUS | Status: DC

## 2014-10-14 NOTE — H&P (Signed)
Expand All Collapse All      HPI: 80 year old gentleman presenting for followup evaluation. The patient has been followed for coronary artery disease status post CABG. He has undergone PCI in 2013 when he presented with a non-ST elevation infarction. The vein graft to PDA was stented with a bare-metal stent. The patient also has been diagnosed with severe aortic stenosis, type 2 diabetes, hypertension, peripheral arterial disease, and Mantle cell lymphoma. He underwent left transmetatarsal amputation in January because of osteomyelitis. He was recently hospitalized after a syncopal episode. He was sitting in church and began to feel bad with nausea, diaphoresis, and lightheadedness. He took a nitroglycerin and had frank syncope. He was noted to be hypotensive and bradycardic following the event. The patient was hospitalized and his beta blocker was stopped. He was advised to avoid nitroglycerin.  The patient has been wearing an event monitor since hospital discharge. He actually feels well after medication adjustment. He denies any further episodes of lightheadedness or presyncope. He's been out doing a little work in his yard without problems. He denies chest pain, chest pressure, or shortness of breath that his current activity level. He's had no leg swelling, orthopnea, or PND.   Outpatient Encounter Prescriptions as of 08/28/2014  Medication Sig  . aspirin EC 81 MG tablet Take 81 mg by mouth every morning.   . gabapentin (NEURONTIN) 300 MG capsule Take 300-600 mg by mouth 3 (three) times daily. Take 600 mg in the morning, 600 mg at midday, and 300 mg at bedtime.  . isosorbide mononitrate (IMDUR) 30 MG 24 hr tablet Take 30 mg by mouth daily.  . metFORMIN (GLUCOPHAGE) 500 MG tablet Take 500 mg by mouth 2 (two) times daily with a meal.   . nitroGLYCERIN (NITROSTAT) 0.4 MG SL tablet Place 1 tablet (0.4 mg total) under the tongue every 5 (five) minutes as needed.  .  pravastatin (PRAVACHOL) 40 MG tablet Take 40 mg by mouth daily.  Marland Kitchen sulfamethoxazole-trimethoprim (BACTRIM DS) 800-160 MG per tablet   . vitamin B-12 (CYANOCOBALAMIN) 500 MCG tablet Take 1,000 mcg by mouth daily.     Allergies  Allergen Reactions  . Losartan     Hyperkalemia > 7  . Azithromycin Itching  . Doxycycline Itching       . Keflex [Cephalexin] Itching  . Macrodantin Itching  . Minocycline Hcl Itching  . Nitrofurantoin Itching  . Zithromax [Azithromycin Dihydrate] Itching  . Contrast Media [Iodinated Diagnostic Agents] Rash    Past Medical History  Diagnosis Date  . CAD (coronary artery disease)     a. s/p CABG 1995; b. NSTEMI & subsequent BMS to SVG->right PDA 02/03/12.; c. Cath 02/14/2012 3vd with 4/4 patent grafts and patent stent in vg->pda.  . Carotid stenosis     a. Carotid dopplers 0/73: RICA 71-06%, LICA 2-69%; b. dopplers 3/14: 48-54% RICA, 6-27% LICA  . HTN (hypertension)   . HLD (hyperlipidemia)   . DM2 (diabetes mellitus, type 2)   . DJD (degenerative joint disease)   . Asthmatic bronchitis   . BPH (benign prostatic hypertrophy)   . Herpes zoster   . Anemia   . Thrombocytopenia     a. secondary to splenomegaly related to lymphoma with suspicion of bone marrow involvement. (Plavix had to be stopped due to this)  . Aortic stenosis, severe     a. ECHO 07/22/14 Severe AS. Peak and mean gradients of 57 mmHg and 42 mmHg, respectively. Calculated AVA is 0.8-0.9 cm2. Trivial AR. Valve area (VTI):  0.84 cm^2. Valve area (Vmax): 0.86 cm^2.. Aortic root dimension: 42 mm (ED) and aortic root mildly dilated.  Marland Kitchen Splenomegaly   . 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 Stage III s/p bendamustine, Rituxan therapy  . COPD (chronic obstructive pulmonary disease)   . Peripheral vascular disease     a. s/p L ray amp 10/13; b. s/p L 2nd toe  amp 12/13  . Pulmonary nodule     a. 63mm RUL calcified pulm nodule noted on CT staging for lymphoma - stable 10/2012.  . Leg DVT (deep venous thromboembolism), chronic     a. LE dopplers 5/13, 10/13 and 1/14: chronic DVT involving right mid femoral vein, left mid femoral vein, and left popliteal vein.  . Osteomyelitis     a. L first foot ray amputation 09/2012  . History of echocardiogram     a. 2D ECHO: 07/22/2014; 55-60%, mild LVG. G1DD. Severe AS. Peak and mean gradients of 57 mmHg and 42 mmHg, respectively. Calculated AVA is 0.8-0.9 cm2. Trivial AR. Valve area (VTI): 0.84 cm^2. Valve area (Vmax): 0.86 cm^2.. Aortic root dimension: 42 mm (ED) and aortic root mildly dilated. Moderate posterior MAC. Mild MR. Severe LA dilation. Mild TR. PA pressure 30.     ROS: Negative except as per HPI  BP 115/60  Pulse 70  Ht 6' (1.829 m)  Wt 186 lb 1.9 oz (84.423 kg)  BMI 25.24 kg/m2  PHYSICAL EXAM: Pt is alert and oriented, pleasant elderly male in NAD HEENT: normal Neck: JVP - normal, carotids delayed with transmitted murmur noted Lungs: CTA bilaterally CV: RRR with harsh grade 3/6 late peaking crescendo decrescendo murmur at her upper sternal border Abd: soft, NT, Positive BS, no hepatomegaly Ext: no C/C/E Skin: warm/dry no rash  EKG: Normal sinus rhythm 70 beats per minute, first-degree AV block, nonspecific IVCD. Possible age-indeterminate septal infarct.  2D Echo: Study Conclusions  - Left ventricle: The cavity size was normal. Wall thickness was increased in a pattern of mild LVH. Systolic function was normal. The estimated ejection fraction was in the range of 55% to 60%. Images were inadequate for LV wall motion assessment. Doppler parameters are consistent with abnormal left ventricular relaxation (grade 1 diastolic dysfunction). The E/e&' ratio is between 8-15, suggesting indeterminate LV filling pressure. - Aortic valve: Moderately calcified leafelts  with reduced excursion. There is severe aortic stenosis. Peak and mean gradients of 57 mmHg and 42 mmHg, respectively. Based on an LVOT diameter of 2.0 cm, the calculated AVA is 0.8-0.9 cm2. There was trivial regurgitation. Valve area (VTI): 0.84 cm^2. Valve area (Vmax): 0.86 cm^2. - Aorta: Aortic root dimension: 42 mm (ED). - Aortic root: The aortic root is mildly dilated. - Mitral valve: Moderate posterior MAC. There was mild regurgitation. - Left atrium: Severely dilated (37 cm2). - Right atrium: Severely dilated. - Tricuspid valve: There was mild regurgitation. - Pulmonary arteries: PA peak pressure: 30 mm Hg (S). - Inferior vena cava: The vessel was normal in size. The respirophasic diameter changes were in the normal range (= 50%), consistent with normal central venous pressure.  Impressions:  - Compared to the prior echo in 3.2015, the EF has normalized. The aortic valve gradient has increased as well and the mean gradient is around 40 mmHg. This suggests &quot;true&quot; aortic stenosis, which is severe - AVA of 0.8-0.9 cm2.  ASSESSMENT AND PLAN: 1. Severe aortic stenosis with recent episode of syncope 2. Coronary artery disease status post CABG without symptoms of  active ischemia. 3. Hypertension, controlled 4. Type 2 diabetes, managed by primary physician 5. Mantle cell lymphoma, followed by Dr. Beryle Beams 6. Hx osteomyelitis s/p left transmetatarsal amputation January 2050  The patient is clinically improved after stopping his beta blocker. He is tolerating a low dose of long-acting isosorbide, but should continue to avoid sublingual nitroglycerin. Despite his relative lack of symptoms at present, he clearly has progressive and now severe aortic stenosis with a recent episode of presyncope evolving to frank syncope with the use of sublingual nitroglycerin. The patient is here with his wife today for further discussion regarding treatment options for his aortic stenosis.  We reviewed the natural history of severe aortic stenosis at length. Treatment options were compared. He would like to proceed with further evaluation. I think the next step is to proceed with cardiac catheterization considering his history of coronary artery disease, CABG, and vein graft PCI. As long as his bypass grafts remain patent, TAVR might be a reasonable treatment option for this elderly gentleman with multiple medical problems as outlined above.   I have reviewed the risks, indications, and alternatives to cardiac catheterization were reviewed with the patient. Risks include but are not limited to bleeding, infection, vascular injury, stroke, myocardial infection, arrhythmia, emergency cardiac surgery, and death. The patient understands the risks of serious complication is low (<7%).   Following cardiac catheterization, I will likely refer him to Dr. Cyndia Bent for further evaluation with respect to his aortic stenosis.  [name] MD 08/28/2014 11:41 AM  ADDENDUM 10/14/2014: The patient was interviewed and examined. Since this office visit, he has undergone diagnostic cardiac catheterization. This demonstrated severe stenosis at the ostium of the saphenous vein graft RCA within a previously implanted stent. The patient has also been evaluated by Dr. Cyndia Bent for consideration of TAVR. We had initially planned on performing PCI and catheter in the same setting. However, the patient has developed resting anginal chest pain and we decided to bring him in for PCI more urgently. He has been adequately preloaded with clopidogrel. He has not had any recurrent chest pain over the past 3-4 days. All questions were answered.  I have reviewed the risks, indications, and alternatives to PCI were reviewed with the patient. Risks include but are not limited to bleeding, infection, vascular injury, stroke, myocardial infection, arrhythmia, kidney injury, radiation-related injury in the case of prolonged  fluoroscopy use, emergency cardiac surgery, and death. The patient understands the risks of serious complication is low (<0%).   Cath Lab Visit (complete for each Cath Lab visit)  Clinical Evaluation Leading to the Procedure:   ACS: No.  Non-ACS:    Anginal Classification: CCS III  Anti-ischemic medical therapy: Minimal Therapy (1 class of medications)  Non-Invasive Test Results: No non-invasive testing performed  Prior CABG: Previous CABG   [name] 10/14/2014 12:02 PM

## 2014-10-14 NOTE — Progress Notes (Signed)
V BAND REMOVAL  LOCATION:    left radial  DEFLATED PER PROTOCOL:    Yes.    TIME BAND OFF / DRESSING APPLIED:    1745   SITE UPON ARRIVAL:    Level 0  SITE AFTER BAND REMOVAL:    Level 0  REVERSE ALLEN'S TEST:     positive  CIRCULATION SENSATION AND MOVEMENT:    Within Normal Limits   Yes.    COMMENTS:   Tolerated procedure well

## 2014-10-14 NOTE — Progress Notes (Signed)
Post-PCI note:  Pt seen about 8pm tonight. Doing well without chest pain or dyspnea. Radial site clear. Plan to see Dr Roxy Manns 11/16, then tentatively scheduled for TAVR first Tuesday in December. Follow-up will be arranged. Anticipate d/c tomorrow am.   [name] 10/14/2014 10:50 PM

## 2014-10-14 NOTE — Care Management Note (Addendum)
  Page 1 of 1   10/14/2014     6:30:10 PM CARE MANAGEMENT NOTE 10/14/2014  Patient:  [name], [name]   Account Number:  [id number]  Date Initiated:  10/14/2014  Documentation initiated by:  Mariann Laster  Subjective/Objective Assessment:   CAD, Angina     Action/Plan:   CM to follow for disposition needs   Anticipated DC Date:  10/15/2014   Anticipated DC Plan:  HOME/SELF CARE         Choice offered to / List presented to:             Status of service:  Completed, signed off Medicare Important Message given?   (If response is "NO", the following Medicare IM given date fields will be blank) Date Medicare IM given:   Medicare IM given by:   Date Additional Medicare IM given:   Additional Medicare IM given by:    Discharge Disposition:  HOME/SELF CARE  Per UR Regulation:  Reviewed for med. necessity/level of care/duration of stay  If discussed at [place] of Stay Meetings, dates discussed:    Comments:  Jady Braggs RN, BSN, MSHL, CCM  Nurse - Case Manager,  (Unit [phone]  10/14/2014 Specialty med review:  Plavix Dispo Plan:  Home / Self care.

## 2014-10-14 NOTE — CV Procedure (Signed)
    CARDIAC CATH NOTE  Name: ARJUNA DOEDEN MRN: [id number] DOB: May 20, 1927  Procedure: PTCA and stenting of the SVG-PDA (severe in-stent restenosis)  Indication: Angina at Rest  Procedural Details: The left wrist was prepped, draped, and anesthetized with 1% lidocaine. Using the modified Seldinger technique, a 6 Fr sheath was introduced into the radial artery. 3 mg verapamil was administered through the radial sheath. Weight-based heparin was given for anticoagulation. Once a therapeutic ACT was achieved, a 6 [place] MPA guide catheter was inserted.  A [place] coronary guidewire was used to cross the lesion.  The lesion was predilated with a 2.5 mm [place] balloon.  This was followed by a 3.0 mm Flext[place] Cutting Balloon. Following cutting balloon angioplasty there was mild residual stenosis and I was concerned his risk of recurrent restenosis would be high without stenting. The lesion was then stented with a 3.0x20 mm Promus DES.  The stent was postdilated with a 3.5 mm noncompliant balloon to 20 atm.  Following PCI, there was 20% residual stenosis and TIMI-3 flow. Final angiography confirmed an excellent result. The patient tolerated the procedure well. There were no immediate procedural complications. A TR band was used for radial hemostasis. The patient was transferred to the post catheterization recovery area for further monitoring.  Lesion Data: Vessel: SVG-PDA Percent stenosis (pre): 95 TIMI-flow (pre):  3 Stent:  3.0x20 mm Promus DES Percent stenosis (post): 20 TIMI-flow (post): 3  Contrast: 45 cc  Radiation dose/Fluoro time: 6.5 minutes  Estimated Blood Loss: minimal  Conclusions: Successful PCI of the SVG-PDA with a single DES  Recommendations: ASA/plavix x 12 months. Tentative plans for TAVR next month.  [name] MD, [facility] 10/14/2014, 1:56 PM

## 2014-10-15 ENCOUNTER — Other Ambulatory Visit: Payer: Self-pay | Admitting: *Deleted

## 2014-10-15 DIAGNOSIS — I208 Other forms of angina pectoris: Secondary | ICD-10-CM

## 2014-10-15 DIAGNOSIS — E119 Type 2 diabetes mellitus without complications: Secondary | ICD-10-CM | POA: Diagnosis not present

## 2014-10-15 DIAGNOSIS — T82858A Stenosis of vascular prosthetic devices, implants and grafts, initial encounter: Secondary | ICD-10-CM | POA: Diagnosis not present

## 2014-10-15 DIAGNOSIS — I25119 Atherosclerotic heart disease of native coronary artery with unspecified angina pectoris: Secondary | ICD-10-CM | POA: Diagnosis not present

## 2014-10-15 DIAGNOSIS — I10 Essential (primary) hypertension: Secondary | ICD-10-CM | POA: Diagnosis not present

## 2014-10-15 DIAGNOSIS — I350 Nonrheumatic aortic (valve) stenosis: Secondary | ICD-10-CM

## 2014-10-15 LAB — CBC
HEMATOCRIT: 33.3 % — AB (ref 39.0–52.0)
Hemoglobin: 11.3 g/dL — ABNORMAL LOW (ref 13.0–17.0)
MCH: 32.1 pg (ref 26.0–34.0)
MCHC: 33.9 g/dL (ref 30.0–36.0)
MCV: 94.6 fL (ref 78.0–100.0)
PLATELETS: 81 10*3/uL — AB (ref 150–400)
RBC: 3.52 MIL/uL — ABNORMAL LOW (ref 4.22–5.81)
RDW: 14.6 % (ref 11.5–15.5)
WBC: 3.6 10*3/uL — ABNORMAL LOW (ref 4.0–10.5)

## 2014-10-15 LAB — BASIC METABOLIC PANEL
ANION GAP: 15 (ref 5–15)
BUN: 26 mg/dL — ABNORMAL HIGH (ref 6–23)
CALCIUM: 9.5 mg/dL (ref 8.4–10.5)
CO2: 21 mEq/L (ref 19–32)
Chloride: 101 mEq/L (ref 96–112)
Creatinine, Ser: 0.91 mg/dL (ref 0.50–1.35)
GFR calc Af Amer: 87 mL/min — ABNORMAL LOW (ref >90)
GFR, EST NON AFRICAN AMERICAN: 75 mL/min — AB (ref >90)
GLUCOSE: 173 mg/dL — AB (ref 70–99)
Potassium: 4.1 mEq/L (ref 3.7–5.3)
Sodium: 137 mEq/L (ref 137–147)

## 2014-10-15 LAB — GLUCOSE, CAPILLARY: Glucose-Capillary: 148 mg/dL — ABNORMAL HIGH (ref 70–99)

## 2014-10-15 LAB — HEMOGLOBIN A1C
Hgb A1c MFr Bld: 7.3 % — ABNORMAL HIGH (ref <5.7)
Mean Plasma Glucose: 163 mg/dL — ABNORMAL HIGH (ref <117)

## 2014-10-15 MED ORDER — CLOPIDOGREL BISULFATE 75 MG PO TABS: 75.0000 mg | ORAL_TABLET | Freq: Every day | ORAL | Status: DC

## 2014-10-15 MED ORDER — TRAZODONE HCL 50 MG PO TABS
50.0000 mg | ORAL_TABLET | Freq: Every evening | ORAL | Status: DC | PRN
  Administered 2014-10-15: 50 mg via ORAL
  Filled 2014-10-15 (×2): qty 1

## 2014-10-15 NOTE — Progress Notes (Signed)
CARDIAC REHAB PHASE I   PRE:  Rate/Rhythm: 103 SR PACs/ PVCs  BP:  Supine:   Sitting: 142/46  Standing:    SaO2:   MODE:  Ambulation: 300 ft   POST:  Rate/Rhythm: 86 SR PACs/PVCs  BP:  Supine:   Sitting: 162/68  Standing:    SaO2:  0745-0830 Pt walked 300 ft with his cane and hand held asst with steady gait. No CP. Tolerated well. To recliner after walk. Reviewed plavix and stent , heart healthy diet choices and modified ex ed. Pt will need re enforcement by family. Discussed with RN to have wife read materials left for pt. Did not reviewe NTG as pt had syncope with it prior to adm. Discussed CRP2 for after TAVR but do not think pt will want to attend. Stated he was 80 years old and too old for program. Will follow up when pt returns.   Graylon Good, RN BSN  10/15/2014 8:25 AM

## 2014-10-15 NOTE — Progress Notes (Signed)
Patient Name: [name] Date of Encounter: 10/15/2014     Active Problems:   Angina at rest   Anginal chest pain at rest    Patient Profile: [name] is a 80 year old Caucasian male with PMH of CAD (s/p CABG 1995, PCI 2013 with vein graft to PDA stented with bare-metal stent), HTN, HLD, Aortic Stenosis, PAD, Mantle cell lymphoma, and Type II Diabetes Mellitus who underwent cardiac cath procedure on 10/14/2014 and had PCI of the SVG-PDA with DES placement.  SUBJECTIVE: Reports doing well following his cath procedure yesterday. Denies any chest pain, palpitations, shortness of breath, or dizziness. Patient says he is ready to go home today.  CURRENT MEDS . aspirin EC  81 mg Oral q morning - 10a  . clopidogrel  75 mg Oral Daily  . gabapentin  300 mg Oral QHS  . gabapentin  600 mg Oral BID  . insulin aspart  0-15 Units Subcutaneous TID WC  . isosorbide mononitrate  30 mg Oral Daily  . pravastatin  40 mg Oral Daily  . sodium chloride  3 mL Intravenous Q12H    OBJECTIVE  Filed Vitals:   10/14/14 1700 10/14/14 1946 10/14/14 2314 10/15/14 0144  BP: 179/96 169/74 151/64   Pulse: 72 69 55   Temp: 97.7 F (36.5 C) 97.9 F (36.6 C) 97.6 F (36.4 C)   TempSrc: Oral Oral Oral   Resp: 18 16 18    Height:      Weight:    186 lb 15.2 oz (84.8 kg)  SpO2: 97% 96% 95%     Intake/Output Summary (Last 24 hours) at 10/15/14 0609 Last data filed at 10/15/14 0340  Gross per 24 hour  Intake  795.2 ml  Output    925 ml  Net -129.8 ml   Filed Weights   10/14/14 1012 10/15/14 0144  Weight: 186 lb (84.369 kg) 186 lb 15.2 oz (84.8 kg)    PHYSICAL EXAM  General: Pleasant, NAD. Neuro: Alert and oriented X 3. Moves all extremities spontaneously. Psych: Normal affect. HEENT:  Normal  Neck: Supple without bruits. JVD not elevated. Lungs:  Resp regular and unlabored, CTA without wheezing or rales. Heart: RRR. Grade 3/6 systolic ejection murmur best appreciated at aortic  listening space. Abdomen: Soft, non-tender, non-distended, BS + x 4.  Extremities: No clubbing, no cyanosis edema. DP/PT/Radials 2+ and equal bilaterally. Cath site on left wrist is without erythema or swelling.  Accessory Clinical Findings  CBC  Recent Labs  10/14/14 1100 10/15/14 0314  WBC 3.8* 3.6*  HGB 12.5* 11.3*  HCT 36.9* 33.3*  MCV 96.1 94.6  PLT 97* 81*   Basic Metabolic Panel  Recent Labs  10/14/14 1100 10/15/14 0314  NA 140 137  K 5.0 4.1  CL 101 101  CO2 26 21  GLUCOSE 288* 173*  BUN 25* 26*  CREATININE 0.98 0.91  CALCIUM 10.1 9.5    TELE Sinus Rhythm with 1st Degree AV Block. Several PAC's.    ECG 10/14/2014 - Sinus rhythm with 1st Degree AV Block. Left axis deviation. No ST changes.  Echocardiogram - 07/22/2014  Study Conclusions - Left ventricle: The cavity size was normal. Wall thickness was increased in a pattern of mild LVH. Systolic function was normal. The estimated ejection fraction was in the range of 55% to 60%. Images were inadequate for LV wall motion assessment. Doppler parameters are consistent with abnormal left ventricular relaxation (grade 1 diastolic dysfunction). The E/e&' ratio is between 8-15, suggesting indeterminate  LV filling pressure. - Aortic valve: Moderately calcified leafelts with reduced excursion. There is severe aortic stenosis. Peak and mean gradients of 57 mmHg and 42 mmHg, respectively. Based on an LVOT diameter of 2.0 cm, the calculated AVA is 0.8-0.9 cm2. There was trivial regurgitation. Valve area (VTI): 0.84 cm^2. Valve area (Vmax): 0.86 cm^2. - Aorta: Aortic root dimension: 42 mm (ED). - Aortic root: The aortic root is mildly dilated. - Mitral valve: Moderate posterior MAC. There was mild regurgitation. - Left atrium: Severely dilated (37 cm2). - Right atrium: Severely dilated. - Tricuspid valve: There was mild regurgitation. - Pulmonary arteries: PA peak pressure: 30 mm Hg  (S). - Inferior vena cava: The vessel was normal in size. The respirophasic diameter changes were in the normal range (= 50%), consistent with normal central venous pressure.  Impressions:  - Compared to the prior echo in 3.2015, the EF has normalized. The aortic valve gradient has increased as well and the mean gradient is around 40 mmHg. This suggests &quot;true&quot; aortic stenosis, which is severe - AVA of 0.8-0.9 cm2.    Radiology/Studies  Ct Coronary Morp W/cta Cor W/score W/ca W/cm &/or Wo/cm - 10/09/2014    ADDENDUM REPORT: 10/09/2014 12:02  CLINICAL DATA:  Aortic stenosis  EXAM: Cardiac TAVR CT  TECHNIQUE: The patient was scanned on a Philips 256 scanner. A 120 kV retrospective scan was triggered in the descending thoracic aorta at 111 HU's. Gantry rotation speed was 270 msecs and collimation was .9 mm. 7.5 mg of iv Metoprolol and no nitro were given. The 3D data set was reconstructed in 5% intervals of the R-R cycle. Systolic and diastolic phases were analyzed on a dedicated work station using MPR, MIP and VRT modes. The patient received 80 cc of contrast.  FINDINGS: Aortic Valve: Trileaflet, moderately calcified and thickened with severely reduced leaflet opening.  Aorta:  Mildly diffusely calcified, no dissection, no aneurysm.  Sinotubular Junction:  31 x 30 mm  Ascending Thoracic Aorta:  32 x 31 mm  Aortic Arch:  30 x 38 mm  Descending Thoracic Aorta:  28 x 28 mm  Sinus of Valsalva Measurements:  Non-coronary:  37 mm  Right -coronary:  37 mm  Left -coronary: 38 mm  Coronary Artery Height above Annulus:  Left Main:  13 mm  Right Coronary:  17 mm  Virtual Basal Annulus Measurements:  Maximum/Minimum Diameter:  31 x 26 mm  Perimeter:  99 mm  Area:  618 mm2  Coronary Arteries: The study wasn't intended for coronary evaluation as performed without use of NTG.  Optimum Fluoroscopic Angle for Delivery:  RAO 0 CRA 0  Sheath to tip distance:  63 mm  IMPRESSION: 1. Trileaflet, moderately  calcified and thickened with severely reduced leaflet opening with annular measurements suitable for delivery of 29 mm Edward-SAPIEN XT valve.  2. Optimum Fluoroscopic Angle for Delivery:  RAO 0 CRA 0.  3.  Sufficient coronary to annular distance.  Ena Dawley   Electronically Signed   By: Ena Dawley   On: 10/09/2014 12:02   10/09/2014   EXAM: OVER-READ INTERPRETATION  CT CHEST  The following report is an over-read performed by radiologist Dr. Luana Shu [facility] on 10/09/2014. This over-read does not include interpretation of cardiac or coronary anatomy or pathology. The coronary calcium score/coronary CTA interpretation by the cardiologist is attached.  COMPARISON:  Multiple priors, including 07/02/2013 and 10/16/2012  FINDINGS: Evaluation of the lung parenchyma is constrained by respiratory motion.  10 x 6  mm nodular opacity in the medial right upper lobe (series 412/image 8), previously 11 x 6 mm, unchanged.  6 x 6 mm nodular opacity in the superior segment right lower lobe (series 412/image 36), previously 8 x 7 mm, grossly unchanged.  Although the current study is motion degraded, approximate two year stability has been demonstrated.  Dependent atelectasis in the bilateral lower lobes. Underlying emphysematous changes. No pleural effusion or pneumothorax.  No suspicious thoracic lymphadenopathy.  Degenerative changes of the thoracic spine.  IMPRESSION: Stable right lung nodules, with at least two year stability, as described above.  Otherwise, no significant extracardiac findings.  Electronically Signed: By: Julian Hy M.D. On: 10/09/2014 11:35   Ct Angio Abd/pel W/ And/or W/o - 10/08/2014    CLINICAL DATA:  80 year old male with history of severe aortic stenosis. Preprocedural study prior to potential transcatheter aortic valve replacement (TAVR) procedure.  EXAM: CTA ABDOMEN AND PELVIS WITH CONTRAST  TECHNIQUE: Multidetector CT imaging of the abdomen and pelvis was  performed using the standard protocol during bolus administration of intravenous contrast. Multiplanar reconstructed images and MIPs were obtained and reviewed to evaluate the vascular anatomy.  CONTRAST:  60mL OMNIPAQUE IOHEXOL 350 MG/ML SOLN  COMPARISON:  CT of the chest, abdomen and pelvis 07/02/2013.  FINDINGS: CT ABDOMEN AND PELVIS FINDINGS  Lower chest: Small amount of scarring in the lung bases bilaterally. Mild cardiomegaly. Atherosclerotic calcifications in the distal left anterior descending and right coronary arteries. Severe calcifications of the mitral annulus. Postoperative changes of CABG are noted, with a saphenous vein graft to the distal RCA noted posterior to the right atrium.  Hepatobiliary: The liver has a slightly shrunken appearance and nodular contour, with mild caudate lobe hypertrophy, suggestive of early changes of cirrhosis. No focal cystic or solid hepatic lesions. Specifically, no definite hypervascular hepatic lesion. Calcified granuloma in segment 8 of the liver incidentally noted. No intra or extrahepatic biliary ductal dilatation. Gallbladder is normal in appearance.  Pancreas: Unremarkable.  Spleen: The spleen is enlarged measuring 13.4 x 7.7 x 16.3 cm (estimated splenic volume of 841 mL).  Adrenals/Urinary Tract: Several sub cm low-attenuation lesions are noted in the kidneys bilaterally (left greater than right), too small to characterize, but favored to represent small cysts. No hydroureteronephrosis or perinephric stranding to indicate urinary tract obstruction at this time. Urinary bladder is normal in appearance. Bilateral adrenal glands are normal in appearance.  Stomach/Bowel: Normal appearance of the stomach. No pathologic dilatation of small bowel or colon. Large diverticulum from the third portion of the duodenum incidentally noted, measuring approximately 4.3 cm in diameter; no surrounding inflammation to suggest associated diverticulitis. There are numerous colonic  diverticulae, particularly in the distal descending colon and proximal sigmoid colon, without surrounding inflammatory changes to suggest an diverticulitis at this time. Normal appendix (retrocecal in position).  Vascular/Lymphatic: Findings and measurements pertinent to potential TAVR procedure, as detailed below. Incidental note is also made of an accessory renal artery to the lower pole of the left kidney. Single right renal artery. 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 Portal vein is mildly dilated measuring up to 17 mm in diameter. Immediately inferior to the mid transverse colon (image 52 of series 401) there is a 1.1 x 1.8 cm soft tissue attenuation lesion, favored to represent a mildly enlarged lymph node. In retrospect, this is unchanged compared to prior study 07/02/2013, and favored to be benign. No other pathologically enlarged lymph nodes are noted in the abdomen or pelvis.  Reproductive:  Prostate gland is unremarkable in appearance.  Other: No significant volume of ascites.  No pneumoperitoneum.  Musculoskeletal: There are no aggressive appearing lytic or blastic lesions noted in the visualized portions of the skeleton.  VASCULAR MEASUREMENTS PERTINENT TO TAVR:  AORTA:  Minimal Aortic Diameter -  13 x 14 mm  Severity of Aortic Calcification -  moderate  RIGHT PELVIS:  Right Common Iliac Artery -  Minimal Diameter - 8.1 x 7.7 mm  Tortuosity - mild  Calcification - moderate  Right External Iliac Artery -  Minimal Diameter - 7.6 x 7.2 mm  Tortuosity - moderate  Calcification - none  Right Common Femoral Artery -  Minimal Diameter - 8.4 x 7.7 mm  Tortuosity - mild  Calcification - mild  LEFT PELVIS:  Left Common Iliac Artery -  Minimal Diameter - 8.4 x 9.9 mm  Tortuosity - mild  Calcification - mild  Left External Iliac Artery -  Minimal Diameter - 9.0 x 7.9 mm  Tortuosity - moderate  Calcification - minimal  Left Common Femoral Artery -  Minimal Diameter - 7.3 x 6.2 mm  Tortuosity  - mild  Calcification - mild  Review of the MIP images confirms the above findings.  IMPRESSION: 1. Vascular findings and measurements pertinent to potential TAVR procedure, as detailed above. This patient does appear to have suitable pelvic arterial access. 2. The appearance of the liver is compatible with underlying cirrhosis. No hypervascular lesion is noted at this time to suggest the presence of a hepatocellular carcinoma. However, there is stigmata of portal hypertension, including mild portal vein dilatation and splenomegaly. 3. Additional incidental findings, as detailed above.   Electronically Signed   By: Vinnie Langton M.D.   On: 10/08/2014 14:29    ASSESSMENT AND PLAN  1. Severe Aortic Stenosis with recent episode of syncope - ECHO on 07/22/14 showed Severe AS. Peak and mean gradients of 57 mmHg and 42 mmHg, respectively. Calculated AVA is 0.8-0.9 cm2. Trivial AR. Valve area (VTI): 0.84 cm^2. Valve area (Vmax): 0.86 cm^2. Aortic root dimension: 42 mm (ED) and aortic root mildly dilated. - Planning for TAVR procedure first Tuesday of December. - Patient has an appointment at 9:30 AM on 10/21/2014 with Dr. Roxy Manns for 2nd opinion regarding TAVR procedure. - Discharge today once seen by Dr. Martinique  2. CAD s/p CABG - Cath 10/14/2014 DES to the SVG-PDA as part of preTAVR workup - Continue ASA and plavix  3. Type II DM - SSI while in hospital  4. HTN - BP has been 151/64 - 179/96 over past 24 hours. On admission BP 115 on current medication, given severe aortic stenosis, would not drop his BP with admission of BP med now  5. HLD - Continue Pravastatin 40mg  daily.  Hilbert Corrigan PA-C Pager: [phone]  Seen with Dineen Kid, PA-Student. Treatment plan discussed Patient seen and examined and history reviewed. Agree with above findings and plan. Patient is asymptomatic post PCI. Cath site looks good. BP is elevated but will monitor for now on usual meds. Stable for DC  today.  Peter Martinique, [place] 10/15/2014 7:45 AM

## 2014-10-15 NOTE — Progress Notes (Signed)
Dr Burt Knack in to examine, aware patient is in [place] 60's with freq PAC's.  No new orders.

## 2014-10-15 NOTE — Discharge Summary (Signed)
Discharge Summary   Patient ID: [name],  MRN: [id number], DOB/AGE: 08-Mar-1927 80 y.o.  Admit date: 10/14/2014 Discharge date: 10/15/2014  Primary Care Provider: Donnajean Lopes Primary Cardiologist: Dr. Burt Knack  Discharge Diagnoses Principal Problem:   Anginal chest pain at rest Active Problems:   Diabetes   Essential hypertension   Hyperlipidemia   Peripheral arterial disease   Carotid artery disease   Syncope   Coronary atherosclerosis of native coronary artery   Allergies Allergies  Allergen Reactions  . Losartan Other (See Comments)    Hyperkalemia > 7  . Azithromycin Itching  . Contrast Media [Iodinated Diagnostic Agents] Rash  . Doxycycline Itching       . Keflex [Cephalexin] Itching  . Macrodantin Itching  . Minocycline Hcl Itching  . Nitrofurantoin Itching  . Zithromax [Azithromycin Dihydrate] Itching    Procedures  Cardiac catheterization Procedure: PTCA and stenting of the SVG-PDA (severe in-stent restenosis)  Lesion Data: Vessel: SVG-PDA Percent stenosis (pre): 95 TIMI-flow (pre): 3 Stent: 3.0x20 mm Promus DES Percent stenosis (post): 20 TIMI-flow (post): 3  Estimated Blood Loss: minimal  Conclusions: Successful PCI of the SVG-PDA with a single DES  Recommendations: ASA/plavix x 12 months. Tentative plans for TAVR next month.     Hospital Course  The patient is an 80 year old Caucasian male with past medical history of CAD status post CABG, hypertension, hyperlipidemia, diabetes and severe aortic stenosis who is currently on undergoing evaluation for TAVR procedure tentatively scheduled for the 1st week of Dec 2015. He underwent left transmetatarsal amputation in January due to osteomyelitis. Of note, he was admitted in August due to syncope. His beta blocker was stopped at the time due to bradycardia in the setting of syncope. He was also advised to avoid nitroglycerin.   Patient was seen in the clinic by Dr. Burt Knack who noted  he has been doing well on low-dose long-acting isosorbide medication, and should continue to avoid sublingual nitroglycerin. He continued to have presyncope episodes related to progression of his aortic stenosis. He was evaluated by Dr. Cyndia Bent for potential TAVR procedure and has a follow-up was Dr. Clydene Laming on 10/21/2014 for second opinion prior to the procedure. Patient appeared to have occasional anginal chest pain at rest, after discussing various option, it was recommended for him to undergo cardiac cath with potential PCI. He underwent cardiac catheterization on 10/14/2014 and received a drug-eluting stent to SVG to PDA. He received benadryl, steroid and famotidine for contrast dye allergy. He was placed on aspirin and Plavix and will need to continue for at least 12 months.  He was seen the morning of 10/15/2014, at which time he denies any significant chest discomfort or shortness breath. He is deemed stable for discharge from cardiology perspective. His cath site appears to be stable without significant hematoma bleeding. He will follow-up with Dr. Roxy Manns for second opinion prior to the TAVR procedure which has been tentatively scheduled for the first week of December. I will also scheduled follow-up with Dr. Burt Knack given the patient just received a drug-eluting stent. I have instructed the patient to hold metformin for at least 48 hours after cardiac catheterization.    Discharge Vitals Blood pressure 162/68, pulse 77, temperature 97.8 F (36.6 C), temperature source Oral, resp. rate 20, height 6' (1.829 m), weight 186 lb 15.2 oz (84.8 kg), SpO2 98 %.  Filed Weights   10/14/14 1012 10/15/14 0144  Weight: 186 lb (84.369 kg) 186 lb 15.2 oz (84.8 kg)    Labs  CBC  Recent Labs  10/14/14 1100 10/15/14 0314  WBC 3.8* 3.6*  HGB 12.5* 11.3*  HCT 36.9* 33.3*  MCV 96.1 94.6  PLT 97* 81*   Basic Metabolic Panel  Recent Labs  10/14/14 1100 10/15/14 0314  NA 140 137  K 5.0 4.1  CL 101 101    CO2 26 21  GLUCOSE 288* 173*  BUN 25* 26*  CREATININE 0.98 0.91  CALCIUM 10.1 9.5    Disposition  Pt is being discharged home today in good condition.  Follow-up Plans & Appointments      Follow-up Information    Follow up with [name], MD.   Specialty:  Cardiology   Why:  Office will call you to schedule followup, if you do not hear from [place] in 2 business days, please give [place] a call   Contact information:   [address] [address] [phone]       Follow up with Rexene Alberts, MD On 10/21/2014.   Specialty:  Cardiothoracic Surgery   Why:  9:30am. 2nd opinion for TAVR   Contact information:   [place] [address] [phone]       Discharge Medications    Medication List    STOP taking these medications        diphenhydrAMINE 25 mg capsule  Commonly known as:  BENADRYL     famotidine 20 MG tablet  Commonly known as:  PEPCID     predniSONE 20 MG tablet  Commonly known as:  DELTASONE     PREDNISONE PO      TAKE these medications        aspirin EC 81 MG tablet  Take 81 mg by mouth every morning.     clopidogrel 75 MG tablet  Commonly known as:  PLAVIX  Take 1 tablet (75 mg total) by mouth daily.     gabapentin 300 MG capsule  Commonly known as:  NEURONTIN  Take 300-600 mg by mouth 3 (three) times daily. Take 600 mg in the morning, 600 mg at midday, and 300 mg at bedtime.     isosorbide mononitrate 30 MG 24 hr tablet  Commonly known as:  IMDUR  Take 30 mg by mouth daily.     metFORMIN 500 MG tablet  Commonly known as:  GLUCOPHAGE  Take 500 mg by mouth 2 (two) times daily with a meal.     nitroGLYCERIN 0.4 MG SL tablet  Commonly known as:  NITROSTAT  Place 0.4 mg under the tongue every 5 (five) minutes as needed for chest pain.     pravastatin 40 MG tablet  Commonly known as:  PRAVACHOL  Take 40 mg by mouth daily.     vitamin B-12 500 MCG tablet  Commonly known as:   CYANOCOBALAMIN  Take 1,000 mcg by mouth daily.        Duration of Discharge Encounter   Greater than 30 minutes including physician time.  Hilbert Corrigan PA-C Pager: [phone] 10/15/2014, 8:45 AM

## 2014-10-15 NOTE — Discharge Instructions (Signed)
No driving for 24 hours. No lifting over 5 lbs for 1 week. No sexual activity for 1 week. Keep procedure site clean & dry. If you notice increased pain, swelling, bleeding or pus, call/return!  You may shower, but no soaking baths/hot tubs/pools for 1 week.  ° ° °

## 2014-10-17 ENCOUNTER — Other Ambulatory Visit: Payer: Self-pay | Admitting: *Deleted

## 2014-10-17 ENCOUNTER — Telehealth: Payer: Self-pay

## 2014-10-17 DIAGNOSIS — T50995A Adverse effect of other drugs, medicaments and biological substances, initial encounter: Secondary | ICD-10-CM

## 2014-10-17 DIAGNOSIS — I350 Nonrheumatic aortic (valve) stenosis: Secondary | ICD-10-CM

## 2014-10-17 MED ORDER — PREDNISONE 20 MG PO TABS: 20.0000 mg | ORAL_TABLET | ORAL | Status: DC

## 2014-10-17 NOTE — Telephone Encounter (Signed)
60mg  prednisone on Monday 11/30 @ 7pm and 60mg  prednisone on Tuesday 12/1 at 7am. #6 tablets no refills call to [place].

## 2014-10-21 ENCOUNTER — Ambulatory Visit: Payer: Medicare Other | Attending: Surgery | Admitting: Physical Therapy

## 2014-10-21 ENCOUNTER — Ambulatory Visit: Payer: Medicare Other | Admitting: Thoracic Surgery (Cardiothoracic Vascular Surgery)

## 2014-10-21 DIAGNOSIS — Z5189 Encounter for other specified aftercare: Secondary | ICD-10-CM | POA: Diagnosis not present

## 2014-10-21 DIAGNOSIS — I2510 Atherosclerotic heart disease of native coronary artery without angina pectoris: Secondary | ICD-10-CM | POA: Diagnosis not present

## 2014-10-21 DIAGNOSIS — R262 Difficulty in walking, not elsewhere classified: Secondary | ICD-10-CM | POA: Insufficient documentation

## 2014-10-21 DIAGNOSIS — I10 Essential (primary) hypertension: Secondary | ICD-10-CM | POA: Insufficient documentation

## 2014-10-21 DIAGNOSIS — E785 Hyperlipidemia, unspecified: Secondary | ICD-10-CM | POA: Insufficient documentation

## 2014-10-21 DIAGNOSIS — I6529 Occlusion and stenosis of unspecified carotid artery: Secondary | ICD-10-CM | POA: Diagnosis not present

## 2014-10-21 NOTE — Therapy (Signed)
Physical Therapy Evaluation  Patient Details  Name: [name] MRN: [id number] Date of Birth: [birth year]  Encounter Date: 10/21/2014      PT End of Session - 10/21/14 1101    Visit Number 1   PT Start Time 1101   PT Stop Time 1211   PT Time Calculation (min) 70 min      Past Medical History  Diagnosis Date  . CAD (coronary artery disease)     a. s/p CABG 1995;  b. NSTEMI & subsequent BMS to SVG->right PDA 02/03/12.;  c. Cath 02/14/2012 3vd with 4/4 patent grafts and patent stent in vg->pda.  . Carotid stenosis     a. Carotid dopplers 3/66: RICA 44-03%, LICA 4-74%;  b. dopplers 3/14:  25-95% RICA, 6-38% LICA  . HTN (hypertension)   . HLD (hyperlipidemia)   . Asthmatic bronchitis   . BPH (benign prostatic hypertrophy)   . Herpes zoster   . Anemia   . Thrombocytopenia     a. secondary to splenomegaly related to lymphoma with suspicion of bone marrow involvement. (Plavix had to be stopped due to this)  . Aortic stenosis, severe     a. ECHO 07/22/14 Severe AS. Peak and mean gradients of 57 mmHg and 42 mmHg, respectively. Calculated AVA is 0.8-0.9 cm2. Trivial AR. Valve area (VTI): 0.84 cm^2. Valve area (Vmax): 0.86 cm^2.. Aortic root dimension: 42 mm (ED) and aortic root mildly dilated.  Marland Kitchen Splenomegaly   . COPD (chronic obstructive pulmonary disease)   . Peripheral vascular disease     a. s/p L ray amp 10/13;  b. s/p L 2nd toe amp 12/13  . Pulmonary nodule     a. 28mm RUL calcified pulm nodule noted on CT staging for lymphoma - stable 10/2012.  . Leg DVT (deep venous thromboembolism), chronic     a. LE dopplers 5/13, 10/13 and 1/14: chronic DVT involving right mid femoral vein, left mid femoral vein, and left popliteal vein.  . Osteomyelitis     a. L first foot ray amputation 09/2012  . History of echocardiogram     a. 2D ECHO: 07/22/2014; 55-60%, mild LVG. G1DD. Severe AS. Peak and mean gradients of 57 mmHg and 42 mmHg, respectively. Calculated AVA is 0.8-0.9 cm2. Trivial AR.  Valve area (VTI): 0.84 cm^2. Valve area (Vmax): 0.86 cm^2.. Aortic root dimension: 42 mm (ED) and aortic root mildly dilated.  Moderate posterior MAC. Mild MR. Severe LA dilation. Mild TR. PA pressure 30.   Marland Kitchen Heart murmur   . Anginal pain   . Myocardial infarction ?1995  . Pneumonia ?2014  . DM2 (diabetes mellitus, type 2)   . DJD (degenerative joint disease)   . 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 Stage III s/p bendamustine, Rituxan therapy    Past Surgical History  Procedure Laterality Date  . Coronary artery bypass graft  1995  . Shoulder arthroscopy w/ rotator cuff repair Right   . Carpal tunnel release Bilateral   . Incisional hernia repair    . Kidney surgery      "cut me 1/2 in 2 for stones"  . Back surgery    . I&d extremity  09/16/2012    Procedure: IRRIGATION AND DEBRIDEMENT EXTREMITY;  Surgeon: Wylene Simmer, MD;  Location: [facility];  Service: Orthopedics;  Laterality: Left;  irrigation and debridement and first Ray amputation of left foot  . Amputation  09/16/2012    Procedure: AMPUTATION RAY;  Surgeon: Wylene Simmer, MD;  Location: [facility];  Service: Orthopedics;  Laterality: Left;  first Ray amputation left foot  . Amputation  11/21/2012    Procedure: AMPUTATION RAY;  Surgeon: Wylene Simmer, MD;  Location: [place];  Service: Orthopedics;  Laterality: Left;  LEFT SECOND TOE AMPUTATION  . I&d extremity Left 03/01/2013    Procedure: IRRIGATION AND DEBRIDEMENT EXTREMITY;  Surgeon: Wylene Simmer, MD;  Location: [facility];  Service: Orthopedics;  Laterality: Left;  IRRIGATION  AND  DEBRIDEMENT  LEFT  FOOT  . Amputation Left 12/20/2013    Procedure: AMPUTATION LEFT 5TH TRANSMETATARSAL;  Surgeon: Wylene Simmer, MD;  Location: [place];  Service: Orthopedics;  Laterality: Left;  . Achilles tendon lengthening Left 12/20/2013    Procedure: ACHILLES TENDON LENGTHENING;  Surgeon: Wylene Simmer, MD;  Location: [place];  Service: Orthopedics;  Laterality: Left;  . Hernia repair    .  Coronary angioplasty with stent placement  09/2014; 10/14/2014    "?2; 1"  . Cataract extraction w/ intraocular lens  implant, bilateral Bilateral     There were no vitals taken for this visit.  Visit Diagnosis:  Difficulty walking - Plan: PT plan of care cert/re-cert      Subjective Assessment - 10/21/14 1105    Symptoms Pt presents to OP PT prior to possible TAVR surgery with primary dx due to aortic stenosis. Pt's primary complaint is SOB.    Patient Stated Goals To improve mobility   Currently in Pain? Yes   Pain Score 9    Pain Location Face   Pain Orientation Right   Pain Descriptors / Indicators Burning   Pain Type Chronic pain   Pain Onset More than a month ago   Pain Frequency Constant   Aggravating Factors  None specific   Pain Relieving Factors None Specific   Effect of Pain on Daily Activities Pt reports pain due to shingles. PT will monitor but no pain goal to follow.          [facility] PT Assessment - 10/21/14 0001    Assessment   Medical Diagnosis aortic stenosis   Onset Date 04/20/14   Next MD Visit 11/05/14   Precautions   Precautions None   Restrictions   Weight Bearing Restrictions No   Balance Screen   Has the patient fallen in the past 6 months No   Has the patient had a decrease in activity level because of a fear of falling?  No   Is the patient reluctant to leave their home because of a fear of falling?  No   Home Environment   Living Enviornment Private residence   Living Arrangements Spouse/significant other   Type of [place] to enter   Entrance Stairs-Number of Steps 2   Entrance Stairs-Rails Left   [place] One level   Prior Function   Level of [place] with gait   Posture/Postural Control   Posture/Postural Control No significant limitations   AROM   Overall AROM  Within functional limits for tasks performed   Strength   Overall Strength Comments Grossly 4/5, L metatarsal amupation noted   Grip  (lbs) 70 R   Grip (lbs) 76 L   Ambulation/Gait   Ambulation/Gait Yes   Ambulation/Gait Assistance 6: Modified independent (Device/Increase time)   Assistive device Straight cane  uses some at home but mostly forgets it   Gait Pattern Wide base of support  no toe off L side, mild unsteadiness, steppage like gait  PT Education - 2014-11-16 1212    Education provided Yes   Education Details fall risk and use of cane   Person(s) Educated Patient;Spouse   Methods Explanation   Comprehension Verbalized understanding              Plan - 2014-11-16 1143    Clinical Impression Statement Pt appears to be primarily limited by SOB. He had to stop walking due to SOB and was unable to resume. He described a heaviness in his chest immediately after ambulation which did not immediately reolve with rest. He did decide to take a Nitroglycerin tablet as instructed by physician.   PT Frequency One time visit   Recommended Other Services Pt may benefit from skilled PT following surgery to assist in returning to prior level of function.    Consulted and Agree with Plan of Care Patient          G-Codes - 2014-11-16 1147    Functional Assessment Tool Used 6 minute walk 130 feet, limited by SOB and heaviness in chest   Functional Limitation Mobility: Walking and moving around   Mobility: Walking and Moving Around Current Status [phone]) At least 40 percent but less than 60 percent impaired, limited or restricted   Mobility: Walking and Moving Around Goal Status [phone]) At least 40 percent but less than 60 percent impaired, limited or restricted   Mobility: Walking and Moving Around Discharge Status [phone]) At least 40 percent but less than 60 percent impaired, limited or restricted      Problem List Patient Active Problem List   Diagnosis Date Noted  . Anginal chest pain at rest 10/14/2014  . Coronary atherosclerosis of native coronary artery 08/09/2014  . Aortic stenosis, severe    . Leg DVT (deep venous thromboembolism), chronic   . History of echocardiogram   . Fever, unspecified 07/22/2014  . Syncope 07/21/2014  . Abscess of right leg 05/17/2014  . Foot osteomyelitis, left 12/20/2013  . Hyperkalemia 11/23/2013  . Olecranon bursitis 09/12/2013  . Other pancytopenia 03/05/2013    Class: Acute  . Diabetic foot ulcer associated with type 2 diabetes mellitus 02/28/2013  . Mantle cell lymphoma 02/28/2013  . Carotid artery disease 02/28/2013  . Hyponatremia 02/28/2013  . Gangrenous toe 11/21/2012  . Abnormal EKG 11/21/2012  . Peripheral arterial disease 09/13/2012    Class: Chronic  . Pneumonitis 06/07/2012  . Dermatitis 06/07/2012  . Splenomegaly 04/13/2012  . History of blood transfusion 04/13/2012  . Lymphoma 04/07/2012  . Neuralgia 03/16/2012  . BPH (benign prostatic hyperplasia) 03/16/2012  . Hyperlipidemia 03/16/2012  . Thrombocytopenia   . Pedal edema 02/08/2012  . Anemia 02/08/2012  . Carotid bruit 12/21/2011  . Diabetes 12/19/2009  . Essential hypertension 12/19/2009  . CEREBROVASCULAR DISEASE 12/19/2009                        [facility] Pre-Surgical Assessment - November 16, 2014 1116    5 Meter Walk Test- trial 1 6 sec   5 Meter Walk Test- trial 2 6 sec.    5 Meter Walk Test- trial 3 7 sec.   Timed Up & Go Test trial  13 sec.   Comments indicative of increased fall risk   4 Stage Balance Test tolerated for:  10 sec.   4 Stage Balance Test Position 2   comment unable to assume tandem  indicative of high fall risk   Comment unable to perform with UE support  indicates increased fall risk  ADL/IADL Independent with: Bathing;Dressing;Meal prep;Finances   ADL/IADL Needs Assistance with: [place] Leaver work   ADL/IADL Fraility Index Vulnerable   Endurance additional comments rest at 2:15 and 130 feet, 72 bpm, 99% O2, 2/10 Modified Dyspnea scale, reports heaviness in chest   6 Minute Walk- Baseline yes   BP (mmHg) 158/66 mmHg   HR (bpm) 76   02  Sat (%RA) 97 %   Modified Borg Scale for Dyspnea 0- Nothing at all   Perceived Rate of Exertion (Borg) 6-   6 Minute Walk Post Test yes   BP (mmHg) 150/66 mmHg   HR (bpm) 72   02 Sat (%RA) 99 %   Modified Borg Scale for Dyspnea 2- Mild shortness of breath   Perceived Rate of Exertion (Borg) 13- Somewhat hard       After patient took a Nitro pill, his symptoms did slowly resolve. He waited at the clinic for 20 minutes and then left via private vehicle to return home. PT consulted with Levonne Spiller, RN, who consulted with Dr. [name] on this plan. [name] will call patient later today to check in. PT instructed pt and wife that is symptoms worsen at all to call 911. Pt and wife verbalized understanding.                      Romualdo Bolk, PT, DPT 10/21/2014 12:15 PM [phone]

## 2014-10-23 ENCOUNTER — Other Ambulatory Visit: Payer: Self-pay | Admitting: Cardiovascular Disease

## 2014-10-28 ENCOUNTER — Encounter: Payer: Self-pay | Admitting: Thoracic Surgery (Cardiothoracic Vascular Surgery)

## 2014-10-28 ENCOUNTER — Ambulatory Visit (INDEPENDENT_AMBULATORY_CARE_PROVIDER_SITE_OTHER): Payer: Medicare Other | Admitting: Thoracic Surgery (Cardiothoracic Vascular Surgery)

## 2014-10-28 VITALS — BP 145/63 | HR 50 | Temp 97.0°F | Resp 16 | Ht 72.0 in | Wt 185.0 lb

## 2014-10-28 DIAGNOSIS — I350 Nonrheumatic aortic (valve) stenosis: Secondary | ICD-10-CM

## 2014-10-28 DIAGNOSIS — I6529 Occlusion and stenosis of unspecified carotid artery: Secondary | ICD-10-CM

## 2014-10-28 NOTE — Progress Notes (Signed)
[facility] CONSULTATION REPORT  Referring Provider is [name], MD PCP is Donnajean Lopes, MD  Chief Complaint  Patient presents with  . Aortic Stenosis    TAVR CONSULT....completed [place] questionaire this visit...has seen Dr. Cyndia Bent    HPI:  Patient is an 80 year old white male with history of aortic stenosis, coronary artery disease, Hypertension, type 2 diabetes mellitus, hyperlipidemia, and mantle cell lymphoma referred for a second surgical opinion to discuss treatment options for management of severe symptomatic aortic stenosis. The patient has long history of coronary artery disease for which she underwent coronary artery bypass grafting By Dr. Cyndia Bent in 1995. In 2013 the patient Presented with a non-ST segment elevation myocardial infarction. He was found to have high-grade stenosis of vein graft placed to the right coronary system, and this was treated with PCI and stenting using a bare-metal stent. He was noted to have moderate aortic stenosis at the time. Since then the patient has developed progressive symptoms of exertional shortness of breath and fatigue. In August he suffered a syncopal episode while in church. He ruled out for an acute myocardial infarction aired echocardiogram demonstrated significant progression of severity of aortic stenosis with peak velocity across the aortic valve approaching 4 m/sec corresponding to mean transvalvular gradient estimated 42 mmHg.  Left ventricular systolic function was preserved with ejection fraction 55-60%.  Left and right heart catheterization was performed by Dr. Burt Knack. This demonstrated severe native three-vessel coronary artery disease with 100% chronic occlusion of the left anterior descending coronary artery, left circumflex coronary artery, and the distal right coronary artery.  The left internal mammary artery graft was widely patent to the  left anterior descending coronary artery, and the sequential saphenous vein graft to the left circumflex system was widely patent as well. However, there was 95% high-grade restenosis of the stented portion of the proximal vein graft to the right coronary system.  Peaked peak and mean transvalvular gradients measured across the aortic valve were 25 and 23 m mercury, respectively, corresponding to calculated valve area 1.3 cm.  Pulmonary artery pressures were normal. The patient was referred for surgical consultation and seen by Dr. Cyndia Bent who felt the patient was a relatively poor candidate for redo coronary artery bypass grafting and aortic valve replacement.  The patient subsequently underwent CT angiography to determine the anatomical feasibility of transcatheter aortic valve replacement for definitive management of aortic stenosis. Following this he underwent PCI and stenting of the vein graft to right coronary system approximately 2 weeks ago.  The patient presents to the office today for a second surgical opinion to discuss treatment options for management of aortic stenosis.  The patient is married and lives locally on a farm in the [place] area with his wife. He retired from [employer] in [place].  He has been physically active for all of his life until recently. He states that over the last 2-3 years he has experienced significant physical decline primarily because of progressive exertional shortness of breath and generalized fatigue.  The patient now gets short of breath with mild to moderate physical activity and he tires very easily. He denies any symptoms of resting shortness of breath, PND, orthopnea, or lower extremity edema. He has not had any exertional chest pain or chest tightness. He has had some slight dizzy spells and he suffered the syncopal episode in church reported several months ago. Over the last 2 weeks he has not experienced  any significant improvement in symptoms of  exertional shortness of breath since he underwent PCI and stenting. Last week he developed increased productive cough and low-grade fever. He was given a prescription for Tamiflu and some type of antibiotic by his primary care physician. He has not had any subsequent fevers and overall he feels improved although he still has a persistent dry hacking cough.  His physical mobility is limited primarily by exertional shortness of breath and fatigue. He states that he also has some minor difficulty with balance and he walks using a cane. He does not experience chronic pain.    Past Medical History  Diagnosis Date  . CAD (coronary artery disease)     a. s/p CABG 1995;  b. NSTEMI & subsequent BMS to SVG->right PDA 02/03/12.;  c. Cath 02/14/2012 3vd with 4/4 patent grafts and patent stent in vg->pda.  . Carotid stenosis     a. Carotid dopplers 7/51: RICA 70-01%, LICA 7-49%;  b. dopplers 3/14:  44-96% RICA, 7-59% LICA  . HTN (hypertension)   . HLD (hyperlipidemia)   . Asthmatic bronchitis   . BPH (benign prostatic hypertrophy)   . Herpes zoster   . Anemia   . Thrombocytopenia     a. secondary to splenomegaly related to lymphoma with suspicion of bone marrow involvement. (Plavix had to be stopped due to this)  . Aortic stenosis, severe     a. ECHO 07/22/14 Severe AS. Peak and mean gradients of 57 mmHg and 42 mmHg, respectively. Calculated AVA is 0.8-0.9 cm2. Trivial AR. Valve area (VTI): 0.84 cm^2. Valve area (Vmax): 0.86 cm^2.. Aortic root dimension: 42 mm (ED) and aortic root mildly dilated.  Marland Kitchen Splenomegaly   . COPD (chronic obstructive pulmonary disease)   . Peripheral vascular disease     a. s/p L ray amp 10/13;  b. s/p L 2nd toe amp 12/13  . Pulmonary nodule     a. 46mm RUL calcified pulm nodule noted on CT staging for lymphoma - stable 10/2012.  . Leg DVT (deep venous thromboembolism), chronic     a. LE dopplers 5/13, 10/13 and 1/14: chronic DVT involving right mid femoral vein, left mid femoral  vein, and left popliteal vein.  . Osteomyelitis     a. L first foot ray amputation 09/2012  . History of echocardiogram     a. 2D ECHO: 07/22/2014; 55-60%, mild LVG. G1DD. Severe AS. Peak and mean gradients of 57 mmHg and 42 mmHg, respectively. Calculated AVA is 0.8-0.9 cm2. Trivial AR. Valve area (VTI): 0.84 cm^2. Valve area (Vmax): 0.86 cm^2.. Aortic root dimension: 42 mm (ED) and aortic root mildly dilated.  Moderate posterior MAC. Mild MR. Severe LA dilation. Mild TR. PA pressure 30.   Marland Kitchen Heart murmur   . Anginal pain   . Myocardial infarction ?1995  . Pneumonia ?2014  . DM2 (diabetes mellitus, type 2)   . DJD (degenerative joint disease)   . 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 Stage III s/p bendamustine, Rituxan therapy    Past Surgical History  Procedure Laterality Date  . Coronary artery bypass graft  1995  . Shoulder arthroscopy w/ rotator cuff repair Right   . Carpal tunnel release Bilateral   . Incisional hernia repair    . Kidney surgery      "cut me 1/2 in 2 for stones"  . Back surgery    . I&d extremity  09/16/2012    Procedure: IRRIGATION AND DEBRIDEMENT EXTREMITY;  Surgeon:  Wylene Simmer, MD;  Location: [facility];  Service: Orthopedics;  Laterality: Left;  irrigation and debridement and first Ray amputation of left foot  . Amputation  09/16/2012    Procedure: AMPUTATION RAY;  Surgeon: Wylene Simmer, MD;  Location: [facility];  Service: Orthopedics;  Laterality: Left;  first Ray amputation left foot  . Amputation  11/21/2012    Procedure: AMPUTATION RAY;  Surgeon: Wylene Simmer, MD;  Location: [place];  Service: Orthopedics;  Laterality: Left;  LEFT SECOND TOE AMPUTATION  . I&d extremity Left 03/01/2013    Procedure: IRRIGATION AND DEBRIDEMENT EXTREMITY;  Surgeon: Wylene Simmer, MD;  Location: [facility];  Service: Orthopedics;  Laterality: Left;  IRRIGATION  AND  DEBRIDEMENT  LEFT  FOOT  . Amputation Left 12/20/2013    Procedure: AMPUTATION LEFT 5TH TRANSMETATARSAL;   Surgeon: Wylene Simmer, MD;  Location: [place];  Service: Orthopedics;  Laterality: Left;  . Achilles tendon lengthening Left 12/20/2013    Procedure: ACHILLES TENDON LENGTHENING;  Surgeon: Wylene Simmer, MD;  Location: [place];  Service: Orthopedics;  Laterality: Left;  . Hernia repair    . Coronary angioplasty with stent placement  09/2014; 10/14/2014    "?2; 1"  . Cataract extraction w/ intraocular lens  implant, bilateral Bilateral     Family History  Problem Relation Age of Onset  . Coronary artery disease    . Alcohol abuse      History   Social History  . Marital Status: Married    Spouse Name: N/A    Number of Children: N/A  . Years of Education: N/A   Occupational History  . retired [employer]   Social History Main Topics  . Smoking status: Former Smoker -- 1.00 packs/day for 30 years    Types: Cigarettes, Pipe    Quit date: 03/29/1985  . Smokeless tobacco: Former Systems developer    Types: Chew     Comment: "chewed for ~ 1 yr after I quit smoking"  . Alcohol Use: No  . Drug Use: No  . Sexual Activity: Not Currently   Other Topics Concern  . Not on file   Social History Narrative    Current Outpatient Prescriptions  Medication Sig Dispense Refill  . aspirin EC 81 MG tablet Take 81 mg by mouth every morning.     . clopidogrel (PLAVIX) 75 MG tablet Take 1 tablet (75 mg total) by mouth daily. 30 tablet 11  . gabapentin (NEURONTIN) 300 MG capsule Take 300-600 mg by mouth 3 (three) times daily. Take 600 mg in the morning, 600 mg at midday, and 300 mg at bedtime.    . isosorbide mononitrate (IMDUR) 30 MG 24 hr tablet TAKE ONE TABLET BY MOUTH ONCE DAILY 90 tablet 0  . metFORMIN (GLUCOPHAGE) 500 MG tablet Take 500 mg by mouth 2 (two) times daily with a meal.     . nitroGLYCERIN (NITROSTAT) 0.4 MG SL tablet Place 0.4 mg under the tongue every 5 (five) minutes as needed for chest pain.    . pravastatin (PRAVACHOL) 40 MG tablet Take 40 mg by mouth daily.    . predniSONE (DELTASONE) 20 MG  tablet Take 1 tablet (20 mg total) by mouth See admin instructions. 6 tablet 0  . vitamin B-12 (CYANOCOBALAMIN) 500 MCG tablet Take 1,000 mcg by mouth daily.      No current facility-administered medications for this visit.    Allergies  Allergen Reactions  . Losartan Other (See Comments)    Hyperkalemia > 7  . Azithromycin  Itching  . Contrast Media [Iodinated Diagnostic Agents] Rash  . Doxycycline Itching       . Keflex [Cephalexin] Itching  . Macrodantin Itching  . Minocycline Hcl Itching  . Nitrofurantoin Itching  . Zithromax [Azithromycin Dihydrate] Itching      Review of Systems:   General:  decreased appetite, decreasede energy, no weight gain, + weight loss, + low-grade fever last week - resolved  Cardiac:  no chest pain with exertion, no chest pain at rest,  + SOB with mild exertion, no resting SOB, no PND, no orthopnea, no palpitations, no arrhythmia, no atrial fibrillation, no LE edema, + dizzy spells, + syncope  Respiratory:  + shortness of breath, no home oxygen, + recent productive cough - now resolved, + chronic dry cough, no bronchitis, no wheezing, no hemoptysis, no asthma, no pain with inspiration or cough, no sleep apnea, no CPAP at night  GI:   no difficulty swallowing, no reflux, no frequent heartburn, no hiatal hernia, no abdominal pain, no constipation, no diarrhea, no hematochezia, no hematemesis, no melena  GU:   no dysuria,  no frequency, no urinary tract infection, no hematuria, no enlarged prostate, no kidney stones, no kidney disease  Vascular:  no pain suggestive of claudication, no pain in feet, no leg cramps, no varicose veins, no DVT, + non-healing foot ulcer - now s/p amputation left foot  Neuro:   no stroke, no TIA's, no seizures, no headaches, no temporary blindness one eye,   no slurred speech, + peripheral neuropathy, no chronic pain, + mild instability of gait - uses a cane for balace, + mild memory/cognitive dysfunction  Musculoskeletal: mild  arthritis, no joint swelling, no myalgias, mild difficulty walking, mildly decreased mobility   Skin:   no rash, no itching, no skin infections, no pressure sores or ulcerations  Psych:   no anxiety, no depression, no nervousness, no unusual recent stress  Eyes:   no blurry vision, no floaters, no recent vision changes, does not wear glasses or contacts  ENT:   no hearing loss, no loose or painful teeth, + dentures, last saw dentist recently  Hematologic:  + easy bruising, no abnormal bleeding, no clotting disorder, no frequent epistaxis  Endocrine:  + diabetes, does not check CBG's at home           Physical Exam:   BP 145/63 mmHg  Pulse 50  Temp(Src) 97 F (36.1 C) (Oral)  Resp 16  Ht 6' (1.829 m)  Wt 185 lb (83.915 kg)  BMI 25.08 kg/m2  SpO2 98%  General:  Elderly and somewhat frail-appearing  HEENT:  Unremarkable   Neck:   no JVD, no bruits, no adenopathy   Chest:   clear to auscultation, symmetrical breath sounds, no wheezes, no rhonchi   CV:   RRR, grade III/VI crescendo/decrescendo murmur heard best at RSB,  no diastolic murmur  Abdomen:  soft, non-tender, no masses   Extremities:  warm, well-perfused, pulses palpable, no LE edema  Rectal/GU  Deferred  Neuro:   Grossly non-focal and symmetrical throughout  Skin:   Clean and dry, no rashes, no breakdown   Diagnostic Tests:  Transthoracic Echocardiography  Patient:  Daniell, Mancinas MR #:    [id number] Study Date: 07/22/2014 Gender:   M Age:    27 Height:   182.9 cm Weight:   82.3 kg BSA:    2.05 m^2 Pt. Status: Room:    [place], [place]  Sanda Klein, MD SONOGRAPHER Melissa Morford, RDCS PERFORMING  [facility], Inpatient  cc:  ------------------------------------------------------------------- LV EF: 55% -  60%  ------------------------------------------------------------------- Indications:   Aortic stenosis  424.1.  ------------------------------------------------------------------- History:  PMH:  Syncope and dyspnea. Coronary artery disease. Stroke. Risk factors: Hypertension. Diabetes mellitus. Dyslipidemia.  ------------------------------------------------------------------- Study Conclusions  - Left ventricle: The cavity size was normal. Wall thickness was increased in a pattern of mild LVH. Systolic function was normal. The estimated ejection fraction was in the range of 55% to 60%. Images were inadequate for LV wall motion assessment. Doppler parameters are consistent with abnormal left ventricular relaxation (grade 1 diastolic dysfunction). The E/e&' ratio is between 8-15, suggesting indeterminate LV filling pressure. - Aortic valve: Moderately calcified leafelts with reduced excursion. There is severe aortic stenosis. Peak and mean gradients of 57 mmHg and 42 mmHg, respectively. Based on an LVOT diameter of 2.0 cm, the calculated AVA is 0.8-0.9 cm2. There was trivial regurgitation. Valve area (VTI): 0.84 cm^2. Valve area (Vmax): 0.86 cm^2. - Aorta: Aortic root dimension: 42 mm (ED). - Aortic root: The aortic root is mildly dilated. - Mitral valve: Moderate posterior MAC. There was mild regurgitation. - Left atrium: Severely dilated (37 cm2). - Right atrium: Severely dilated. - Tricuspid valve: There was mild regurgitation. - Pulmonary arteries: PA peak pressure: 30 mm Hg (S). - Inferior vena cava: The vessel was normal in size. The respirophasic diameter changes were in the normal range (= 50%), consistent with normal central venous pressure.  Impressions:  - Compared to the prior echo in 3.2015, the EF has normalized. The aortic valve gradient has increased as well and the mean gradient is around 40 mmHg. This suggests &quot;true&quot; aortic stenosis, which is severe - AVA of 0.8-0.9  cm2.  ------------------------------------------------------------------- Labs, prior tests, procedures, and surgery: ECG.   Abnormal. Transthoracic echocardiography. M-mode, complete 2D, spectral Doppler, and color Doppler. Birthdate: Patient birthdate: [birth year]. Age: Patient is 80 yr old. Sex: Gender: male. BMI: 24.6 kg/m^2. Blood pressure:   136/60 Patient status: Inpatient. Study date: Study date: 07/22/2014. Study time: 10:15 AM. Location: Echo laboratory.  -------------------------------------------------------------------  ------------------------------------------------------------------- Left ventricle: The cavity size was normal. Wall thickness was increased in a pattern of mild LVH. Systolic function was normal. The estimated ejection fraction was in the range of 55% to 60%. Images were inadequate for LV wall motion assessment. Doppler parameters are consistent with abnormal left ventricular relaxation (grade 1 diastolic dysfunction). The E/e&' ratio is between 8-15, suggesting indeterminate LV filling pressure.  ------------------------------------------------------------------- Aortic valve: Moderately calcified leafelts with reduced excursion. There is severe aortic stenosis. Peak and mean gradients of 57 mmHg and 42 mmHg, respectively. Based on an LVOT diameter of 2.0 cm, the calculated AVA is 0.8-0.9 cm2. Doppler: There was trivial regurgitation.  Valve area (VTI): 0.84 cm^2. Indexed valve area (VTI): 0.41 cm^2/m^2. Valve area (Vmax): 0.86 cm^2. Indexed valve area (Vmax): 0.42 cm^2/m^2.  Mean gradient (S): 43 mm Hg. Peak gradient (S): 57 mm Hg.  ------------------------------------------------------------------- Aorta: Aortic root: The aortic root is mildly dilated. Ascending aorta: The ascending aorta was normal in size.  ------------------------------------------------------------------- Mitral valve: Moderate posterior MAC.  Doppler: There was mild regurgitation.  Peak gradient (D): 2 mm Hg.  ------------------------------------------------------------------- Left atrium: Severely dilated (37 cm2).  ------------------------------------------------------------------- Right ventricle: The cavity size was normal. Wall thickness was normal. Systolic function was normal.  ------------------------------------------------------------------- Pulmonic valve:  The valve appears to be grossly normal. Doppler: There was no significant regurgitation.  ------------------------------------------------------------------- Tricuspid valve:  Doppler: There was  mild regurgitation.  ------------------------------------------------------------------- Pulmonary artery:  The main pulmonary artery was normal-sized.  ------------------------------------------------------------------- Right atrium: Severely dilated.  ------------------------------------------------------------------- Pericardium: There was no pericardial effusion.  ------------------------------------------------------------------- Systemic veins: Inferior vena cava: The vessel was normal in size. The respirophasic diameter changes were in the normal range (= 50%), consistent with normal central venous pressure.  ------------------------------------------------------------------- Measurements  Left ventricle         Value     02/07/2014 Reference LV ID, ED, PLAX        50  mm    49.9    43 - 52 chordal LV ID, ES, PLAX    (H)   47.5 mm    40.1    23 - 38 chordal LV fx shortening, PLAX (L)   5   %    20     >=29 chordal LV PW thickness, ED      11.7 mm    15.7    --------- IVS/LV PW ratio, ED      1.02      0.79    <=1.3 Stroke volume, 2D       71  ml    ---------- --------- Stroke volume/bsa, 2D     35  ml/m^2  ----------  --------- LV e&', lateral         7.02 cm/s   7.57    --------- LV E/e&', lateral        10.61     7.6    --------- LV e&', medial         7.24 cm/s   6.25    --------- LV E/e&', medial        10.29     9.2    --------- LV e&', average         7.13 cm/s   ---------- --------- LV E/e&', average        10.45     ---------- ---------  Ventricular septum       Value     02/07/2014 Reference IVS thickness, ED       11.9 mm    12.4    ---------  LVOT              Value     02/07/2014 Reference LVOT ID, S           20  mm    21     --------- LVOT area           3.14 cm^2   3.46    ---------  Aortic valve          Value     02/07/2014 Reference Aortic valve peak       379  cm/s   350    --------- velocity, S Aortic valve mean       317  cm/s   282    --------- velocity, S Aortic valve VTI, S      85.8 cm    83.9    --------- Aortic mean gradient,     43  mm Hg  35     --------- S Aortic peak gradient,     57  mm Hg  49     --------- S Aortic valve area, VTI     0.84 cm^2   1.02    --------- Aortic valve area/bsa,     0.41 cm^2/m^2 0.5    --------- VTI Aortic valve area,       0.86 cm^2   1.07    ---------  peak velocity Aortic valve area/bsa,     0.42 cm^2/m^2 0.52    --------- peak velocity  Aorta             Value     02/07/2014 Reference Aortic root ID, ED       42  mm    38     ---------  Left atrium          Value     02/07/2014 Reference LA ID, A-P, ES         37  mm    48     --------- LA ID/bsa, A-P         1.8  cm/m^2  2.35    <=2.2  Mitral valve          Value      02/07/2014 Reference Mitral E-wave peak       74.5 cm/s   57.5    --------- velocity Mitral A-wave peak       104  cm/s   104    --------- velocity Mitral deceleration      158  ms    326    150 - 230 time Mitral peak gradient,     2   mm Hg  ---------- --------- D Mitral E/A ratio, peak     0.7      0.6    ---------  Pulmonary arteries       Value     02/07/2014 Reference PA pressure, S, DP       30  mm Hg  ---------- <=30  Tricuspid valve        Value     02/07/2014 Reference Tricuspid regurg peak     261  cm/s   251    --------- velocity Tricuspid peak RV-RA      27  mm Hg  25     --------- gradient Tricuspid maximal       261  cm/s   ---------- --------- regurg velocity, PISA  Systemic veins         Value     02/07/2014 Reference Estimated CVP         3   mm Hg  3     ---------  Right ventricle        Value     02/07/2014 Reference RV pressure, S, DP       30  mm Hg  28     <=30 RV s&', lateral, S       12.8 cm/s   14.3    ---------  Legend: (L) and (H) mark values outside specified reference range.  ------------------------------------------------------------------- Prepared and Electronically Authenticated by  Lyman Bishop MD 2015-08-17T11:37:22    Cardiac Catheterization Procedure Note  Name: ALANMICHAEL BARMORE MRN: [id number] DOB: 1927-02-08  Procedure: Right Heart Cath, Left Heart Cath, Selective Coronary Angiography, LIMA angiography, saphenous vein graft angiography, LV angiography  Indication: Severe symptomatic aortic stenosis, coronary artery disease status post remote CABG and PCI.  Procedural Details: The right groin was prepped, draped, and anesthetized with 1% lidocaine. Using the modified Seldinger technique a 7 French  sheath was placed in the right femoral vein and a 5 French sheath was placed in the right femoral artery. A Swan-Ganz catheter was used for the right heart catheterization. Standard protocol was followed for recording of right heart pressures and sampling of oxygen saturations. Fick cardiac output was calculated. Standard Judkins catheters were used for selective coronary angiography and left ventriculography.  His saphenous vein graft angiography was performed with a JR 4 catheter for the sequence graft to the left circumflex branches and an RCB cath for the saphenous vein graft to RCA. The LIMA was injected with the JR 4 catheter. The aortic valve was crossed using an AL2 (AL-1 would probably fit better) catheter and straight-tip wire. There were no immediate procedural complications. The patient was transferred to the post catheterization recovery area for further monitoring.  Procedural Findings: Hemodynamics RA 2 RV 25/2 PA 21/6 mean 10 PCWP mean 6 LV 173/10 AO 148/61 mean 95  Oxygen saturations: PA 75 AO 98  Cardiac Output (Fick) 6.8  Cardiac Index (Fick) 3.3  Aortic valve hemodynamics: Peak peak gradient 25, mean gradient 23, aortic valve area 1.3 cm  Coronary angiography: Coronary dominance: right  Left mainstem: 100% occlusion  Left anterior descending (LAD): Totally occluded from the native circulation. Fills entirely from the LIMA graft.  Left circumflex (LCx): Totally occluded from the native circulation. Fills entirely from the sequence graft to OM1 and OM 2  Right coronary artery (RCA): Severely calcified. The vessel has long segment 70% stenosis in its midportion. It is 100% occluded in the distal portion.  Saphenous vein graft to PDA: The ostium and proximal portion of the graft is stented. The stented segment has sequential 95% stenoses. The remaining portions of the bypass graft are patent with mild diffuse disease noted in the distal body of the vein  graft. The PDA insertion site appears patent. The PLA and distal RCA filled retrograde from graft flow.  Saphenous vein graft sequential to OM1 and OM 2: Widely patent throughout. There are 3 major OM branches filling from this graft.  LIMA to LAD: Widely patent throughout. The distal anastomosis is widely patent. The proximal LAD fills retrograde.  Left ventriculography: LV function appears low normal, with an LVEF estimated at 50-55%. The mitral annulus is severely calcified. The aortic valve is severely calcified.  Left ventriculography: Left ventricular systolic function is normal, LVEF is estimated at 55-65%, there is no significant mitral regurgitation   Estimated Blood Loss: Minimal  Final Conclusions:  1. Severe native three-vessel disease with total occlusion of the RCA and total occlusion of the left main 2. Status post aortocoronary bypass surgery with continued patency of the LIMA to LAD and sequential saphenous vein graft to OM1 and OM 2 3. Severe in-stent restenosis in the saphenous vein graft to PDA 4. Low normal LV systolic function with normal right heart cardiac filling pressures 5. Moderate to severe aortic stenosis  Recommendations: The patient's aortic stenosis is severe by echo criteria. His valve is heavily calcified with restricted opening of all leaflets. He also has high-grade obstructive disease in the stented segment of the RCA graft. Will refer to Dr. Cyndia Bent for consideration of further treatment options, which includes TAVR/PCI versus redo CABG/AVR  [name] MD, [facility] 09/23/2014, 8:46 AM     CARDIAC CATH NOTE  Name: BERNAL LUHMAN MRN: [id number] DOB: 1927-08-18  Procedure: PTCA and stenting of the SVG-PDA (severe in-stent restenosis)  Indication: Angina at Rest  Procedural Details: The left wrist was prepped, draped, and anesthetized with 1% lidocaine. Using the modified Seldinger technique, a 6 Fr sheath was introduced into the radial artery.  3 mg verapamil was administered through the radial sheath. Weight-based heparin was given for anticoagulation. Once a therapeutic ACT was achieved, a 6 [place] MPA guide catheter was inserted. A Cougar coronary guidewire was used to cross the lesion. The lesion  was predilated with a 2.5 mm [place] balloon. This was followed by a 3.0 mm Flextome Cutting Balloon. Following cutting balloon angioplasty there was mild residual stenosis and I was concerned his risk of recurrent restenosis would be high without stenting. The lesion was then stented with a 3.0x20 mm Promus DES. The stent was postdilated with a 3.5 mm noncompliant balloon to 20 atm. Following PCI, there was 20% residual stenosis and TIMI-3 flow. Final angiography confirmed an excellent result. The patient tolerated the procedure well. There were no immediate procedural complications. A TR band was used for radial hemostasis. The patient was transferred to the post catheterization recovery area for further monitoring.  Lesion Data: Vessel: SVG-PDA Percent stenosis (pre): 95 TIMI-flow (pre): 3 Stent: 3.0x20 mm Promus DES Percent stenosis (post): 20 TIMI-flow (post): 3  Contrast: 45 cc  Radiation dose/Fluoro time: 6.5 minutes  Estimated Blood Loss: minimal  Conclusions: Successful PCI of the SVG-PDA with a single DES  Recommendations: ASA/plavix x 12 months. Tentative plans for TAVR next month.  [name] MD, [facility] 10/14/2014, 1:56 PM      Cardiac TAVR CT  TECHNIQUE: The patient was scanned on a Philips 256 scanner. A 120 kV retrospective scan was triggered in the descending thoracic aorta at 111 HU's. Gantry rotation speed was 270 msecs and collimation was .9 mm. 7.5 mg of iv Metoprolol and no nitro were given. The 3D data set was reconstructed in 5% intervals of the R-R cycle. Systolic and diastolic phases were analyzed on a dedicated work station using MPR, MIP and VRT modes. The patient received 80 cc of  contrast.  FINDINGS: Aortic Valve: Trileaflet, moderately calcified and thickened with severely reduced leaflet opening.  Aorta: Mildly diffusely calcified, no dissection, no aneurysm.  Sinotubular Junction: 31 x 30 mm  Ascending Thoracic Aorta: 32 x 31 mm  Aortic Arch: 30 x 38 mm  Descending Thoracic Aorta: 28 x 28 mm  Sinus of Valsalva Measurements:  Non-coronary: 37 mm  Right -coronary: 37 mm  Left -coronary: 38 mm  Coronary Artery Height above Annulus:  Left Main: 13 mm  Right Coronary: 17 mm  Virtual Basal Annulus Measurements:  Maximum/Minimum Diameter: 31 x 26 mm  Perimeter: 99 mm  Area: 618 mm2  Coronary Arteries: The study wasn't intended for coronary evaluation as performed without use of NTG.  Optimum Fluoroscopic Angle for Delivery: RAO 0 CRA 0  Sheath to tip distance: 63 mm  IMPRESSION: 1. Trileaflet, moderately calcified and thickened with severely reduced leaflet opening with annular measurements suitable for delivery of 29 mm Edward-SAPIEN XT valve.  2. Optimum Fluoroscopic Angle for Delivery: RAO 0 CRA 0.  3. Sufficient coronary to annular distance.  Ena Dawley   Electronically Signed  By: Ena Dawley  On: 10/09/2014 12:02      Study Result     EXAM: OVER-READ INTERPRETATION CT CHEST  The following report is an over-read performed by radiologist Dr. Luana Shu [facility] on 10/09/2014. This over-read does not include interpretation of cardiac or coronary anatomy or pathology. The coronary calcium score/coronary CTA interpretation by the cardiologist is attached.  COMPARISON: Multiple priors, including 07/02/2013 and 10/16/2012  FINDINGS: Evaluation of the lung parenchyma is constrained by respiratory motion.  10 x 6 mm nodular opacity in the medial right upper lobe (series 412/image 8), previously 11 x 6 mm, unchanged.  6 x 6 mm  nodular opacity in the superior segment right lower lobe (series 412/image 36), previously 8 x 7 mm, grossly  unchanged.  Although the current study is motion degraded, approximate two year stability has been demonstrated.  Dependent atelectasis in the bilateral lower lobes. Underlying emphysematous changes. No pleural effusion or pneumothorax.  No suspicious thoracic lymphadenopathy.  Degenerative changes of the thoracic spine.  IMPRESSION: Stable right lung nodules, with at least two year stability, as described above.  Otherwise, no significant extracardiac findings.  Electronically Signed: By: Julian Hy M.D. On: 10/09/2014 11:35     CTA ABDOMEN AND PELVIS WITH CONTRAST  TECHNIQUE: Multidetector CT imaging of the abdomen and pelvis was performed using the standard protocol during bolus administration of intravenous contrast. Multiplanar reconstructed images and MIPs were obtained and reviewed to evaluate the vascular anatomy.  CONTRAST: 52m OMNIPAQUE IOHEXOL 350 MG/ML SOLN  COMPARISON: CT of the chest, abdomen and pelvis 07/02/2013.  FINDINGS: CT ABDOMEN AND PELVIS FINDINGS  Lower chest: Small amount of scarring in the lung bases bilaterally. Mild cardiomegaly. Atherosclerotic calcifications in the distal left anterior descending and right coronary arteries. Severe calcifications of the mitral annulus. Postoperative changes of CABG are noted, with a saphenous vein graft to the distal RCA noted posterior to the right atrium.  Hepatobiliary: The liver has a slightly shrunken appearance and nodular contour, with mild caudate lobe hypertrophy, suggestive of early changes of cirrhosis. No focal cystic or solid hepatic lesions. Specifically, no definite hypervascular hepatic lesion. Calcified granuloma in segment 8 of the liver incidentally noted. No intra or extrahepatic biliary ductal dilatation. Gallbladder is normal in  appearance.  Pancreas: Unremarkable.  Spleen: The spleen is enlarged measuring 13.4 x 7.7 x 16.3 cm (estimated splenic volume of 841 mL).  Adrenals/Urinary Tract: Several sub cm low-attenuation lesions are noted in the kidneys bilaterally (left greater than right), too small to characterize, but favored to represent small cysts. No hydroureteronephrosis or perinephric stranding to indicate urinary tract obstruction at this time. Urinary bladder is normal in appearance. Bilateral adrenal glands are normal in appearance.  Stomach/Bowel: Normal appearance of the stomach. No pathologic dilatation of small bowel or colon. Large diverticulum from the third portion of the duodenum incidentally noted, measuring approximately 4.3 cm in diameter; no surrounding inflammation to suggest associated diverticulitis. There are numerous colonic diverticulae, particularly in the distal descending colon and proximal sigmoid colon, without surrounding inflammatory changes to suggest an diverticulitis at this time. Normal appendix (retrocecal in position).  Vascular/Lymphatic: Findings and measurements pertinent to potential TAVR procedure, as detailed below. Incidental note is also made of an accessory renal artery to the lower pole of the left kidney. Single right renal artery. 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 Portal vein is mildly dilated measuring up to 17 mm in diameter. Immediately inferior to the mid transverse colon (image 52 of series 401) there is a 1.1 x 1.8 cm soft tissue attenuation lesion, favored to represent a mildly enlarged lymph node. In retrospect, this is unchanged compared to prior study 07/02/2013, and favored to be benign. No other pathologically enlarged lymph nodes are noted in the abdomen or pelvis.  Reproductive: Prostate gland is unremarkable in appearance.  Other: No significant volume of ascites. No  pneumoperitoneum.  Musculoskeletal: There are no aggressive appearing lytic or blastic lesions noted in the visualized portions of the skeleton.  VASCULAR MEASUREMENTS PERTINENT TO TAVR:  AORTA:  Minimal Aortic Diameter - 13 x 14 mm  Severity of Aortic Calcification - moderate  RIGHT PELVIS:  Right Common Iliac Artery -  Minimal Diameter - 8.1 x 7.7 mm  Tortuosity -  mild  Calcification - moderate  Right External Iliac Artery -  Minimal Diameter - 7.6 x 7.2 mm  Tortuosity - moderate  Calcification - none  Right Common Femoral Artery -  Minimal Diameter - 8.4 x 7.7 mm  Tortuosity - mild  Calcification - mild  LEFT PELVIS:  Left Common Iliac Artery -  Minimal Diameter - 8.4 x 9.9 mm  Tortuosity - mild  Calcification - mild  Left External Iliac Artery -  Minimal Diameter - 9.0 x 7.9 mm  Tortuosity - moderate  Calcification - minimal  Left Common Femoral Artery -  Minimal Diameter - 7.3 x 6.2 mm  Tortuosity - mild  Calcification - mild  Review of the MIP images confirms the above findings.  IMPRESSION: 1. Vascular findings and measurements pertinent to potential TAVR procedure, as detailed above. This patient does appear to have suitable pelvic arterial access. 2. The appearance of the liver is compatible with underlying cirrhosis. No hypervascular lesion is noted at this time to suggest the presence of a hepatocellular carcinoma. However, there is stigmata of portal hypertension, including mild portal vein dilatation and splenomegaly. 3. Additional incidental findings, as detailed above.   Electronically Signed  By: Vinnie Langton M.D.  On: 10/08/2014 14:29    STS Risk Calculator  Procedure    Redo CABG + AVR  Risk of Mortality   11.2% Morbidity or Mortality  42.2% Prolonged LOS   22.0% Short LOS    11.3% Permanent Stroke   3.1% Prolonged Vent Support  30.3% DSW Infection    0.7% Renal  Failure    14.8% Reoperation    17.1%   Impression:  Patient has stage D severe symptomatic aortic stenosis with preserved left ventricular systolic function and symptoms consistent with chronic diastolic congestive heart failure, New York Heart Association functional class II-III.  He also had a recent syncopal episode.  I have personally reviewed the patient's most recent transthoracic echocardiogram and cardiac catheterization. The patient has thickening and moderate to severe restriction involving all 3 leaflets of the aortic valve.  The patient has severe three-vessel coronary artery disease with chronic occlusion of all 3 native coronary arteries.  All bypass grafts from previous coronary artery bypass surgery remain patent, although the vein graft to the right coronary system required recent repeat PCI and stenting using a bare metal stent approximately 2 weeks ago. Risks associated with conventional surgical aortic valve replacement would be high because of the patient's advanced age and numerous comorbid medical problems. His functional decline over the past 2 or 3 years has been considerable, and under the circumstances I would not consider him a candidate for conventional surgical aortic valve replacement. I agree that transcatheter aortic valve replacement would be a very reasonable alternative. Based upon review of the patient's CT angiograms the patient appears to be a good candidate for transcatheter aortic valve replacement via transfemoral approach.   Plan:  The patient and his wife were counseled at length regarding treatment alternatives for management of severe symptomatic aortic stenosis. Alternative approaches such as conventional aortic valve replacement, transcatheter aortic valve replacement, and palliative medical therapy were compared and contrasted at length.  The risks associated with conventional surgical aortic valve replacement were been discussed in detail, as were  expectations for post-operative convalescence. Long-term prognosis with medical therapy was discussed. This discussion was placed in the context of the patient's own specific clinical presentation and past medical history.  Following the decision to proceed with transcatheter aortic valve replacement, a  discussion has been held regarding what types of management strategies would be attempted intraoperatively in the event of life-threatening complications, including whether or not the patient would be considered a candidate for the use of cardiopulmonary bypass and/or conversion to open sternotomy for attempted surgical intervention.  The patient has been advised of a variety of complications that might develop including but not limited to risks of death, stroke, paravalvular leak, aortic dissection or other major vascular complications, aortic annulus rupture, device embolization, cardiac rupture or perforation, mitral regurgitation, acute myocardial infarction, arrhythmia, heart block or bradycardia requiring permanent pacemaker placement, congestive heart failure, respiratory failure, renal failure, pneumonia, infection, other late complications related to structural valve deterioration or migration, or other complications that might ultimately cause a temporary or permanent loss of functional independence or other long term morbidity.  The patient provides full informed consent for the procedure as described and all questions were answered.   I spent in excess of 90 minutes during the conduct of this office consultation and >50% of this time involved direct face-to-face encounter with the patient for counseling and/or coordination of their care.   Valentina Gu. Roxy Manns, MD 10/28/2014 11:37 AM

## 2014-10-30 NOTE — Pre-Procedure Instructions (Signed)
THIAGO RAGSDALE  10/30/2014   Your procedure is scheduled on:  Tuesday, December 1st  Report to [facility] Admitting at 730 AM.  Call this number if you have problems the morning of surgery: [phone]   Remember:   Do not eat food or drink liquids after midnight.   Take these medicines the morning of surgery with A SIP OF WATER: neurontin   Do not wear jewelry.  Do not wear lotions, powders, or perfumes. You may wear deodorant.  Do not shave 48 hours prior to surgery. Men may shave face and neck.  Do not bring valuables to the hospital.  [facility] is not responsible for any belongings or valuables.               Contacts, dentures or bridgework may not be worn into surgery.  Leave suitcase in the car. After surgery it may be brought to your room.  For patients admitted to the hospital, discharge time is determined by your treatment team.              Please read over the following fact sheets that you were given: Pain Booklet, Coughing and Deep Breathing, Blood Transfusion Information, MRSA Information and Surgical Site Infection Prevention  [place] - Preparing for Surgery  Before surgery, you can play an important role.  Because skin is not sterile, your skin needs to be as free of germs as possible.  You can reduce the number of germs on you skin by washing with CHG (chlorahexidine gluconate) soap before surgery.  CHG is an antiseptic cleaner which kills germs and bonds with the skin to continue killing germs even after washing.  Please DO NOT use if you have an allergy to CHG or antibacterial soaps.  If your skin becomes reddened/irritated stop using the CHG and inform your nurse when you arrive at Short Stay.  Do not shave (including legs and underarms) for at least 48 hours prior to the first CHG shower.  You may shave your face.  Please follow these instructions carefully:   1.  Shower with CHG Soap the night before surgery and the morning of Surgery.  2.   If you choose to wash your hair, wash your hair first as usual with your normal shampoo.  3.  After you shampoo, rinse your hair and body thoroughly to remove the shampoo.  4.  Use CHG as you would any other liquid soap.  You can apply CHG directly to the skin and wash gently with scrungie or a clean washcloth.  5.  Apply the CHG Soap to your body ONLY FROM THE NECK DOWN.  Do not use on open wounds or open sores.  Avoid contact with your eyes, ears, mouth and genitals (private parts).  Wash genitals (private parts) with your normal soap.  6.  Wash thoroughly, paying special attention to the area where your surgery will be performed.  7.  Thoroughly rinse your body with warm water from the neck down.  8.  DO NOT shower/wash with your normal soap after using and rinsing off the CHG Soap.  9.  Pat yourself dry with a clean towel.            10.  Wear clean pajamas.            11.  Place clean sheets on your bed the night of your first shower and do not sleep with pets.  Day of Surgery  Do not apply any  lotions/deoderants the morning of surgery.  Please wear clean clothes to the hospital/surgery center.

## 2014-11-01 ENCOUNTER — Encounter (HOSPITAL_COMMUNITY): Payer: Self-pay

## 2014-11-01 ENCOUNTER — Encounter (HOSPITAL_COMMUNITY)
Admit: 2014-11-01 | Discharge: 2014-11-01 | Disposition: A | Discharge status: Home / Self Care | Payer: Medicare Other | Source: Ambulatory Visit | Attending: Cardiovascular Disease | Admitting: Cardiovascular Disease

## 2014-11-01 ENCOUNTER — Encounter (HOSPITAL_COMMUNITY)
Admission: RE | Admit: 2014-11-01 | Discharge: 2014-11-01 | Disposition: A | Discharge status: Home / Self Care | Payer: Medicare Other | Source: Ambulatory Visit | Attending: Cardiovascular Disease | Admitting: Cardiovascular Disease

## 2014-11-01 VITALS — BP 134/43 | HR 51 | Temp 97.8°F | Resp 18 | Ht 72.0 in | Wt 185.0 lb

## 2014-11-01 DIAGNOSIS — J45909 Unspecified asthma, uncomplicated: Secondary | ICD-10-CM | POA: Diagnosis not present

## 2014-11-01 DIAGNOSIS — I209 Angina pectoris, unspecified: Secondary | ICD-10-CM | POA: Diagnosis not present

## 2014-11-01 DIAGNOSIS — I739 Peripheral vascular disease, unspecified: Secondary | ICD-10-CM | POA: Diagnosis not present

## 2014-11-01 DIAGNOSIS — Z951 Presence of aortocoronary bypass graft: Secondary | ICD-10-CM | POA: Insufficient documentation

## 2014-11-01 DIAGNOSIS — I10 Essential (primary) hypertension: Secondary | ICD-10-CM | POA: Insufficient documentation

## 2014-11-01 DIAGNOSIS — Z01818 Encounter for other preprocedural examination: Secondary | ICD-10-CM | POA: Insufficient documentation

## 2014-11-01 DIAGNOSIS — Z87891 Personal history of nicotine dependence: Secondary | ICD-10-CM | POA: Insufficient documentation

## 2014-11-01 DIAGNOSIS — I350 Nonrheumatic aortic (valve) stenosis: Secondary | ICD-10-CM

## 2014-11-01 DIAGNOSIS — I2510 Atherosclerotic heart disease of native coronary artery without angina pectoris: Secondary | ICD-10-CM | POA: Insufficient documentation

## 2014-11-01 DIAGNOSIS — R011 Cardiac murmur, unspecified: Secondary | ICD-10-CM | POA: Insufficient documentation

## 2014-11-01 DIAGNOSIS — J449 Chronic obstructive pulmonary disease, unspecified: Secondary | ICD-10-CM | POA: Diagnosis not present

## 2014-11-01 LAB — CBC
HCT: 35.9 % — ABNORMAL LOW (ref 39.0–52.0)
HEMOGLOBIN: 11.8 g/dL — AB (ref 13.0–17.0)
MCH: 32.3 pg (ref 26.0–34.0)
MCHC: 32.9 g/dL (ref 30.0–36.0)
MCV: 98.4 fL (ref 78.0–100.0)
Platelets: 93 10*3/uL — ABNORMAL LOW (ref 150–400)
RBC: 3.65 MIL/uL — ABNORMAL LOW (ref 4.22–5.81)
RDW: 14.9 % (ref 11.5–15.5)
WBC: 3.5 10*3/uL — ABNORMAL LOW (ref 4.0–10.5)

## 2014-11-01 LAB — URINALYSIS, ROUTINE W REFLEX MICROSCOPIC
Bilirubin Urine: NEGATIVE
GLUCOSE, UA: NEGATIVE mg/dL
HGB URINE DIPSTICK: NEGATIVE
Ketones, ur: NEGATIVE mg/dL
Leukocytes, UA: NEGATIVE
Nitrite: NEGATIVE
PROTEIN: NEGATIVE mg/dL
SPECIFIC GRAVITY, URINE: 1.018 (ref 1.005–1.030)
Urobilinogen, UA: 0.2 mg/dL (ref 0.0–1.0)
pH: 5.5 (ref 5.0–8.0)

## 2014-11-01 LAB — COMPREHENSIVE METABOLIC PANEL
ALT: 13 U/L (ref 0–53)
AST: 17 U/L (ref 0–37)
Albumin: 3.8 g/dL (ref 3.5–5.2)
Alkaline Phosphatase: 79 U/L (ref 39–117)
Anion gap: 15 (ref 5–15)
BUN: 17 mg/dL (ref 6–23)
CALCIUM: 9.8 mg/dL (ref 8.4–10.5)
CO2: 22 mEq/L (ref 19–32)
Chloride: 106 mEq/L (ref 96–112)
Creatinine, Ser: 1.07 mg/dL (ref 0.50–1.35)
GFR, EST AFRICAN AMERICAN: 71 mL/min — AB (ref >90)
GFR, EST NON AFRICAN AMERICAN: 61 mL/min — AB (ref >90)
Glucose, Bld: 98 mg/dL (ref 70–99)
Potassium: 4.4 mEq/L (ref 3.7–5.3)
Sodium: 143 mEq/L (ref 137–147)
TOTAL PROTEIN: 6 g/dL (ref 6.0–8.3)
Total Bilirubin: 0.4 mg/dL (ref 0.3–1.2)

## 2014-11-01 LAB — BLOOD GAS, ARTERIAL
ACID-BASE DEFICIT: 1.7 mmol/L (ref 0.0–2.0)
Bicarbonate: 22.3 mEq/L (ref 20.0–24.0)
Drawn by: 428831
FIO2: 0.21 %
O2 SAT: 96.9 %
PATIENT TEMPERATURE: 98.6
TCO2: 23.4 mmol/L (ref 0–100)
pCO2 arterial: 35.9 mmHg (ref 35.0–45.0)
pH, Arterial: 7.41 (ref 7.350–7.450)
pO2, Arterial: 95.2 mmHg (ref 80.0–100.0)

## 2014-11-01 LAB — PROTIME-INR
INR: 1.1 (ref 0.00–1.49)
PROTHROMBIN TIME: 14.3 s (ref 11.6–15.2)

## 2014-11-01 LAB — SURGICAL PCR SCREEN
MRSA, PCR: NEGATIVE
STAPHYLOCOCCUS AUREUS: NEGATIVE

## 2014-11-01 LAB — HEMOGLOBIN A1C
Hgb A1c MFr Bld: 6.9 % — ABNORMAL HIGH (ref <5.7)
Mean Plasma Glucose: 151 mg/dL — ABNORMAL HIGH (ref <117)

## 2014-11-01 LAB — APTT: aPTT: 33 seconds (ref 24–37)

## 2014-11-01 MED ORDER — CHLORHEXIDINE GLUCONATE 4 % EX LIQD: 30.0000 mL | CUTANEOUS | Status: DC

## 2014-11-01 NOTE — Pre-Procedure Instructions (Signed)
[name]  11/01/2014   Your procedure is scheduled on:  Tuesday, December 1st  Report to [facility] Admitting at 730 AM.  Call this number if you have problems the morning of surgery: [phone]   Remember:   Do not eat food or drink liquids after midnight.   Take these medicines the morning of surgery with A SIP OF WATER: neurontin. Take Plavix , Metoformin and Aspirin as instructed by MD. Stop all Nsaids now.   Do not wear jewelry.  Do not wear lotions, powders, or perfumes. You may wear deodorant.  Do not shave 48 hours prior to surgery. Men may shave face and neck.  Do not bring valuables to the hospital.  [facility] is not responsible for any belongings or valuables.               Contacts, dentures or bridgework may not be worn into surgery.  Leave suitcase in the car. After surgery it may be brought to your room.  For patients admitted to the hospital, discharge time is determined by your treatment team.              Please read over the following fact sheets that you were given: Pain Booklet, Coughing and Deep Breathing, Blood Transfusion Information, MRSA Information and Surgical Site Infection Prevention  [place] - Preparing for Surgery  Before surgery, you can play an important role.  Because skin is not sterile, your skin needs to be as free of germs as possible.  You can reduce the number of germs on you skin by washing with CHG (chlorahexidine gluconate) soap before surgery.  CHG is an antiseptic cleaner which kills germs and bonds with the skin to continue killing germs even after washing.  Please DO NOT use if you have an allergy to CHG or antibacterial soaps.  If your skin becomes reddened/irritated stop using the CHG and inform your nurse when you arrive at Short Stay.  Do not shave (including legs and underarms) for at least 48 hours prior to the first CHG shower.  You may shave your face.  Please follow these instructions carefully:   1.   Shower with CHG Soap the night before surgery and the morning of Surgery.  2.  If you choose to wash your hair, wash your hair first as usual with your normal shampoo.  3.  After you shampoo, rinse your hair and body thoroughly to remove the shampoo.  4.  Use CHG as you would any other liquid soap.  You can apply CHG directly to the skin and wash gently with scrungie or a clean washcloth.  5.  Apply the CHG Soap to your body ONLY FROM THE NECK DOWN.  Do not use on open wounds or open sores.  Avoid contact with your eyes, ears, mouth and genitals (private parts).  Wash genitals (private parts) with your normal soap.  6.  Wash thoroughly, paying special attention to the area where your surgery will be performed.  7.  Thoroughly rinse your body with warm water from the neck down.  8.  DO NOT shower/wash with your normal soap after using and rinsing off the CHG Soap.  9.  Pat yourself dry with a clean towel.            10.  Wear clean pajamas.            11.  Place clean sheets on your bed the night of your first shower and  do not sleep with pets.  Day of Surgery  Do not apply any lotions/deoderants the morning of surgery.  Please wear clean clothes to the hospital/surgery center.

## 2014-11-04 MED ORDER — SODIUM CHLORIDE 0.9 % IV SOLN
INTRAVENOUS | Status: DC
  Filled 2014-11-04: qty 1000

## 2014-11-04 MED ORDER — SODIUM CHLORIDE 0.9 % IV SOLN
INTRAVENOUS | Status: AC
  Administered 2014-11-05: 4 [IU]/h via INTRAVENOUS
  Filled 2014-11-04: qty 2.5

## 2014-11-04 MED ORDER — EPINEPHRINE HCL 1 MG/ML IJ SOLN
0.0000 ug/min | INTRAVENOUS | Status: DC
  Filled 2014-11-04: qty 4

## 2014-11-04 MED ORDER — CEFUROXIME SODIUM 1.5 G IJ SOLR
1.5000 g | INTRAMUSCULAR | Status: AC
  Administered 2014-11-05: 1.5 g via INTRAVENOUS
  Filled 2014-11-04: qty 1.5

## 2014-11-04 MED ORDER — DOPAMINE-DEXTROSE 3.2-5 MG/ML-% IV SOLN
0.0000 ug/kg/min | INTRAVENOUS | Status: DC
  Filled 2014-11-04: qty 250

## 2014-11-04 MED ORDER — MAGNESIUM SULFATE 50 % IJ SOLN
40.0000 meq | INTRAMUSCULAR | Status: DC
  Filled 2014-11-04: qty 10

## 2014-11-04 MED ORDER — METOPROLOL TARTRATE 12.5 MG HALF TABLET
12.5000 mg | ORAL_TABLET | Freq: Once | ORAL | Status: AC
  Administered 2014-11-05: 12.5 mg via ORAL
  Filled 2014-11-04: qty 1

## 2014-11-04 MED ORDER — DIPHENHYDRAMINE HCL 50 MG/ML IJ SOLN
25.0000 mg | INTRAMUSCULAR | Status: AC
  Administered 2014-11-05: 25 mg via INTRAVENOUS
  Filled 2014-11-04: qty 1

## 2014-11-04 MED ORDER — NITROGLYCERIN IN D5W 200-5 MCG/ML-% IV SOLN
2.0000 ug/min | INTRAVENOUS | Status: DC
  Filled 2014-11-04: qty 250

## 2014-11-04 MED ORDER — POTASSIUM CHLORIDE 2 MEQ/ML IV SOLN
80.0000 meq | INTRAVENOUS | Status: DC
  Filled 2014-11-04: qty 40

## 2014-11-04 MED ORDER — DEXMEDETOMIDINE HCL IN NACL 400 MCG/100ML IV SOLN
0.1000 ug/kg/h | INTRAVENOUS | Status: AC
  Administered 2014-11-05: 0.2 ug/kg/h via INTRAVENOUS
  Filled 2014-11-04: qty 100

## 2014-11-04 MED ORDER — VANCOMYCIN HCL 10 G IV SOLR
1250.0000 mg | INTRAVENOUS | Status: AC
  Administered 2014-11-05: 1250 mg via INTRAVENOUS
  Filled 2014-11-04: qty 1250

## 2014-11-04 MED ORDER — NOREPINEPHRINE BITARTRATE 1 MG/ML IV SOLN
0.0000 ug/min | INTRAVENOUS | Status: DC
  Filled 2014-11-04: qty 4

## 2014-11-04 MED ORDER — SODIUM CHLORIDE 0.9 % IV SOLN
INTRAVENOUS | Status: DC
  Filled 2014-11-04: qty 30

## 2014-11-04 MED ORDER — PHENYLEPHRINE HCL 10 MG/ML IJ SOLN
30.0000 ug/min | INTRAVENOUS | Status: AC
  Administered 2014-11-05: 25 ug/min via INTRAVENOUS
  Filled 2014-11-04: qty 2

## 2014-11-04 MED ORDER — FAMOTIDINE IN NACL 20-0.9 MG/50ML-% IV SOLN
20.0000 mg | INTRAVENOUS | Status: AC
  Administered 2014-11-05: 20 mg via INTRAVENOUS

## 2014-11-05 ENCOUNTER — Encounter (HOSPITAL_COMMUNITY): Payer: Self-pay | Admitting: Anesthesiology

## 2014-11-05 ENCOUNTER — Inpatient Hospital Stay (HOSPITAL_COMMUNITY): Payer: Medicare Other | Admitting: Anesthesiology

## 2014-11-05 ENCOUNTER — Encounter (HOSPITAL_COMMUNITY)
Admission: RE | Disposition: A | Discharge status: Home / Self Care | Payer: Medicare Other | Source: Ambulatory Visit | Attending: Cardiovascular Disease

## 2014-11-05 ENCOUNTER — Inpatient Hospital Stay (HOSPITAL_COMMUNITY)
Admission: RE | Admit: 2014-11-05 | Discharge: 2014-11-07 | DRG: 267 | Disposition: A | Discharge status: Home / Self Care | Payer: Medicare Other | Source: Ambulatory Visit | Attending: Cardiovascular Disease | Admitting: Cardiovascular Disease

## 2014-11-05 ENCOUNTER — Inpatient Hospital Stay (HOSPITAL_COMMUNITY): Payer: Medicare Other

## 2014-11-05 DIAGNOSIS — Z7952 Long term (current) use of systemic steroids: Secondary | ICD-10-CM | POA: Diagnosis not present

## 2014-11-05 DIAGNOSIS — Z7982 Long term (current) use of aspirin: Secondary | ICD-10-CM

## 2014-11-05 DIAGNOSIS — D62 Acute posthemorrhagic anemia: Secondary | ICD-10-CM | POA: Diagnosis not present

## 2014-11-05 DIAGNOSIS — I208 Other forms of angina pectoris: Secondary | ICD-10-CM

## 2014-11-05 DIAGNOSIS — I10 Essential (primary) hypertension: Secondary | ICD-10-CM | POA: Diagnosis present

## 2014-11-05 DIAGNOSIS — I2582 Chronic total occlusion of coronary artery: Secondary | ICD-10-CM | POA: Diagnosis present

## 2014-11-05 DIAGNOSIS — Z881 Allergy status to other antibiotic agents status: Secondary | ICD-10-CM

## 2014-11-05 DIAGNOSIS — I350 Nonrheumatic aortic (valve) stenosis: Secondary | ICD-10-CM | POA: Diagnosis present

## 2014-11-05 DIAGNOSIS — Z955 Presence of coronary angioplasty implant and graft: Secondary | ICD-10-CM | POA: Diagnosis not present

## 2014-11-05 DIAGNOSIS — Z7902 Long term (current) use of antithrombotics/antiplatelets: Secondary | ICD-10-CM

## 2014-11-05 DIAGNOSIS — Z006 Encounter for examination for normal comparison and control in clinical research program: Secondary | ICD-10-CM

## 2014-11-05 DIAGNOSIS — Z951 Presence of aortocoronary bypass graft: Secondary | ICD-10-CM

## 2014-11-05 DIAGNOSIS — J449 Chronic obstructive pulmonary disease, unspecified: Secondary | ICD-10-CM | POA: Diagnosis present

## 2014-11-05 DIAGNOSIS — Z86718 Personal history of other venous thrombosis and embolism: Secondary | ICD-10-CM

## 2014-11-05 DIAGNOSIS — I252 Old myocardial infarction: Secondary | ICD-10-CM

## 2014-11-05 DIAGNOSIS — M1990 Unspecified osteoarthritis, unspecified site: Secondary | ICD-10-CM | POA: Diagnosis present

## 2014-11-05 DIAGNOSIS — I25119 Atherosclerotic heart disease of native coronary artery with unspecified angina pectoris: Secondary | ICD-10-CM | POA: Diagnosis present

## 2014-11-05 DIAGNOSIS — Z91041 Radiographic dye allergy status: Secondary | ICD-10-CM

## 2014-11-05 DIAGNOSIS — E785 Hyperlipidemia, unspecified: Secondary | ICD-10-CM | POA: Diagnosis present

## 2014-11-05 DIAGNOSIS — Z8572 Personal history of non-Hodgkin lymphomas: Secondary | ICD-10-CM | POA: Diagnosis not present

## 2014-11-05 DIAGNOSIS — Z8701 Personal history of pneumonia (recurrent): Secondary | ICD-10-CM

## 2014-11-05 DIAGNOSIS — D649 Anemia, unspecified: Secondary | ICD-10-CM | POA: Diagnosis present

## 2014-11-05 DIAGNOSIS — Z8673 Personal history of transient ischemic attack (TIA), and cerebral infarction without residual deficits: Secondary | ICD-10-CM

## 2014-11-05 DIAGNOSIS — C8313 Mantle cell lymphoma, intra-abdominal lymph nodes: Secondary | ICD-10-CM

## 2014-11-05 DIAGNOSIS — E119 Type 2 diabetes mellitus without complications: Secondary | ICD-10-CM | POA: Diagnosis present

## 2014-11-05 DIAGNOSIS — D696 Thrombocytopenia, unspecified: Secondary | ICD-10-CM | POA: Diagnosis present

## 2014-11-05 DIAGNOSIS — J45909 Unspecified asthma, uncomplicated: Secondary | ICD-10-CM | POA: Diagnosis present

## 2014-11-05 DIAGNOSIS — Z9842 Cataract extraction status, left eye: Secondary | ICD-10-CM | POA: Diagnosis not present

## 2014-11-05 DIAGNOSIS — K7460 Unspecified cirrhosis of liver: Secondary | ICD-10-CM | POA: Diagnosis present

## 2014-11-05 DIAGNOSIS — I739 Peripheral vascular disease, unspecified: Secondary | ICD-10-CM | POA: Diagnosis present

## 2014-11-05 DIAGNOSIS — N400 Enlarged prostate without lower urinary tract symptoms: Secondary | ICD-10-CM | POA: Diagnosis present

## 2014-11-05 DIAGNOSIS — I871 Compression of vein: Secondary | ICD-10-CM | POA: Diagnosis present

## 2014-11-05 DIAGNOSIS — Z794 Long term (current) use of insulin: Secondary | ICD-10-CM

## 2014-11-05 DIAGNOSIS — I5032 Chronic diastolic (congestive) heart failure: Secondary | ICD-10-CM | POA: Diagnosis present

## 2014-11-05 DIAGNOSIS — Z87891 Personal history of nicotine dependence: Secondary | ICD-10-CM | POA: Diagnosis not present

## 2014-11-05 DIAGNOSIS — I4891 Unspecified atrial fibrillation: Secondary | ICD-10-CM | POA: Diagnosis present

## 2014-11-05 DIAGNOSIS — Z89422 Acquired absence of other left toe(s): Secondary | ICD-10-CM

## 2014-11-05 DIAGNOSIS — Z952 Presence of prosthetic heart valve: Secondary | ICD-10-CM

## 2014-11-05 DIAGNOSIS — Z9841 Cataract extraction status, right eye: Secondary | ICD-10-CM | POA: Diagnosis not present

## 2014-11-05 DIAGNOSIS — R161 Splenomegaly, not elsewhere classified: Secondary | ICD-10-CM

## 2014-11-05 DIAGNOSIS — R9431 Abnormal electrocardiogram [ECG] [EKG]: Secondary | ICD-10-CM

## 2014-11-05 HISTORY — DX: Presence of prosthetic heart valve: Z95.2

## 2014-11-05 HISTORY — PX: TRANSCATHETER AORTIC VALVE REPLACEMENT, TRANSFEMORAL: SHX6400

## 2014-11-05 HISTORY — PX: INTRAOPERATIVE TRANSESOPHAGEAL ECHOCARDIOGRAM: SHX5062

## 2014-11-05 LAB — POCT I-STAT, CHEM 8
BUN: 19 mg/dL (ref 6–23)
BUN: 17 mg/dL (ref 6–23)
BUN: 17 mg/dL (ref 6–23)
BUN: 19 mg/dL (ref 6–23)
CREATININE: 0.9 mg/dL (ref 0.50–1.35)
Calcium, Ion: 1.27 mmol/L (ref 1.13–1.30)
Calcium, Ion: 1.37 mmol/L — ABNORMAL HIGH (ref 1.13–1.30)
Calcium, Ion: 1.25 mmol/L (ref 1.13–1.30)
Calcium, Ion: 1.33 mmol/L — ABNORMAL HIGH (ref 1.13–1.30)
Chloride: 103 mEq/L (ref 96–112)
Chloride: 102 mEq/L (ref 96–112)
Chloride: 105 mEq/L (ref 96–112)
Chloride: 106 mEq/L (ref 96–112)
Creatinine, Ser: 0.7 mg/dL (ref 0.50–1.35)
Creatinine, Ser: 0.7 mg/dL (ref 0.50–1.35)
Creatinine, Ser: 0.6 mg/dL (ref 0.50–1.35)
Glucose, Bld: 194 mg/dL — ABNORMAL HIGH (ref 70–99)
Glucose, Bld: 207 mg/dL — ABNORMAL HIGH (ref 70–99)
Glucose, Bld: 188 mg/dL — ABNORMAL HIGH (ref 70–99)
Glucose, Bld: 128 mg/dL — ABNORMAL HIGH (ref 70–99)
HCT: 28 % — ABNORMAL LOW (ref 39.0–52.0)
HCT: 29 % — ABNORMAL LOW (ref 39.0–52.0)
HCT: 28 % — ABNORMAL LOW (ref 39.0–52.0)
HEMATOCRIT: 31 % — AB (ref 39.0–52.0)
HEMOGLOBIN: 9.5 g/dL — AB (ref 13.0–17.0)
HEMOGLOBIN: 10.5 g/dL — AB (ref 13.0–17.0)
Hemoglobin: 9.9 g/dL — ABNORMAL LOW (ref 13.0–17.0)
Hemoglobin: 9.5 g/dL — ABNORMAL LOW (ref 13.0–17.0)
POTASSIUM: 4.1 meq/L (ref 3.7–5.3)
Potassium: 4.3 mEq/L (ref 3.7–5.3)
Potassium: 4.2 mEq/L (ref 3.7–5.3)
Potassium: 3.9 mEq/L (ref 3.7–5.3)
SODIUM: 138 meq/L (ref 137–147)
SODIUM: 139 meq/L (ref 137–147)
SODIUM: 139 meq/L (ref 137–147)
SODIUM: 140 meq/L (ref 137–147)
TCO2: 25 mmol/L (ref 0–100)
TCO2: 22 mmol/L (ref 0–100)
TCO2: 23 mmol/L (ref 0–100)
TCO2: 21 mmol/L (ref 0–100)

## 2014-11-05 LAB — POCT I-STAT 4, (NA,K, GLUC, HGB,HCT)
Glucose, Bld: 163 mg/dL — ABNORMAL HIGH (ref 70–99)
HCT: 31 % — ABNORMAL LOW (ref 39.0–52.0)
Hemoglobin: 10.5 g/dL — ABNORMAL LOW (ref 13.0–17.0)
POTASSIUM: 3.8 meq/L (ref 3.7–5.3)
SODIUM: 142 meq/L (ref 137–147)

## 2014-11-05 LAB — CBC
HEMATOCRIT: 28.5 % — AB (ref 39.0–52.0)
HEMATOCRIT: 30.2 % — AB (ref 39.0–52.0)
HEMOGLOBIN: 9.7 g/dL — AB (ref 13.0–17.0)
HEMOGLOBIN: 10.2 g/dL — AB (ref 13.0–17.0)
MCH: 32.3 pg (ref 26.0–34.0)
MCH: 32.5 pg (ref 26.0–34.0)
MCHC: 34 g/dL (ref 30.0–36.0)
MCHC: 33.8 g/dL (ref 30.0–36.0)
MCV: 95 fL (ref 78.0–100.0)
MCV: 96.2 fL (ref 78.0–100.0)
Platelets: 70 10*3/uL — ABNORMAL LOW (ref 150–400)
Platelets: 62 10*3/uL — ABNORMAL LOW (ref 150–400)
RBC: 3 MIL/uL — AB (ref 4.22–5.81)
RBC: 3.14 MIL/uL — ABNORMAL LOW (ref 4.22–5.81)
RDW: 14.9 % (ref 11.5–15.5)
RDW: 14.8 % (ref 11.5–15.5)
WBC: 2.6 10*3/uL — ABNORMAL LOW (ref 4.0–10.5)
WBC: 2.5 10*3/uL — ABNORMAL LOW (ref 4.0–10.5)

## 2014-11-05 LAB — CREATININE, SERUM
Creatinine, Ser: 0.8 mg/dL (ref 0.50–1.35)
GFR calc Af Amer: >90 mL/min (ref >90)
GFR calc non Af Amer: 79 mL/min — ABNORMAL LOW (ref >90)

## 2014-11-05 LAB — POCT I-STAT 3, ART BLOOD GAS (G3+)
BICARBONATE: 25.2 meq/L — AB (ref 20.0–24.0)
O2 Saturation: 95 %
PH ART: 7.397 (ref 7.350–7.450)
TCO2: 27 mmol/L (ref 0–100)
pCO2 arterial: 40.6 mmHg (ref 35.0–45.0)
pO2, Arterial: 72 mmHg — ABNORMAL LOW (ref 80.0–100.0)

## 2014-11-05 LAB — GLUCOSE, CAPILLARY: Glucose-Capillary: 203 mg/dL — ABNORMAL HIGH (ref 70–99)

## 2014-11-05 LAB — PROTIME-INR
INR: 1.28 (ref 0.00–1.49)
PROTHROMBIN TIME: 16.1 s — AB (ref 11.6–15.2)

## 2014-11-05 LAB — MAGNESIUM: MAGNESIUM: 1.8 mg/dL (ref 1.5–2.5)

## 2014-11-05 LAB — APTT: APTT: 36 s (ref 24–37)

## 2014-11-05 LAB — PREPARE RBC (CROSSMATCH)

## 2014-11-05 SURGERY — IMPLANTATION, AORTIC VALVE, TRANSCATHETER, FEMORAL APPROACH
Anesthesia: General | Site: Chest

## 2014-11-05 MED ORDER — FAMOTIDINE IN NACL 20-0.9 MG/50ML-% IV SOLN
20.0000 mg | Freq: Two times a day (BID) | INTRAVENOUS | Status: DC
  Administered 2014-11-05: 20 mg via INTRAVENOUS

## 2014-11-05 MED ORDER — NEOSTIGMINE METHYLSULFATE 10 MG/10ML IV SOLN
INTRAVENOUS | Status: AC
  Filled 2014-11-05: qty 1

## 2014-11-05 MED ORDER — MORPHINE SULFATE 2 MG/ML IJ SOLN: 1.0000 mg | INTRAMUSCULAR | Status: DC | PRN

## 2014-11-05 MED ORDER — IODIXANOL 320 MG/ML IV SOLN
INTRAVENOUS | Status: DC | PRN
  Administered 2014-11-05: 81 mL via INTRAVENOUS

## 2014-11-05 MED ORDER — ALBUMIN HUMAN 5 % IV SOLN
250.0000 mL | INTRAVENOUS | Status: DC | PRN
  Filled 2014-11-05: qty 250

## 2014-11-05 MED ORDER — CHLORHEXIDINE GLUCONATE 4 % EX LIQD
60.0000 mL | Freq: Once | CUTANEOUS | Status: DC
  Filled 2014-11-05: qty 60

## 2014-11-05 MED ORDER — ISOSORBIDE MONONITRATE ER 30 MG PO TB24
30.0000 mg | ORAL_TABLET | Freq: Every day | ORAL | Status: DC
  Administered 2014-11-06 – 2014-11-07 (×2): 30 mg via ORAL
  Filled 2014-11-05 (×2): qty 1

## 2014-11-05 MED ORDER — HEPARIN SODIUM (PORCINE) 5000 UNIT/ML IJ SOLN
INTRAMUSCULAR | Status: DC | PRN
  Administered 2014-11-05: 500 mL

## 2014-11-05 MED ORDER — TRAMADOL HCL 50 MG PO TABS: 50.0000 mg | ORAL_TABLET | ORAL | Status: DC | PRN

## 2014-11-05 MED ORDER — GABAPENTIN 300 MG PO CAPS
600.0000 mg | ORAL_CAPSULE | Freq: Two times a day (BID) | ORAL | Status: DC
  Administered 2014-11-06 – 2014-11-07 (×3): 600 mg via ORAL
  Filled 2014-11-05 (×5): qty 2

## 2014-11-05 MED ORDER — CLOPIDOGREL BISULFATE 75 MG PO TABS
75.0000 mg | ORAL_TABLET | Freq: Every day | ORAL | Status: DC
  Administered 2014-11-05 – 2014-11-07 (×3): 75 mg via ORAL
  Filled 2014-11-05 (×3): qty 1

## 2014-11-05 MED ORDER — FENTANYL CITRATE 0.05 MG/ML IJ SOLN
INTRAMUSCULAR | Status: AC
  Filled 2014-11-05: qty 5

## 2014-11-05 MED ORDER — ROCURONIUM BROMIDE 100 MG/10ML IV SOLN
INTRAVENOUS | Status: DC | PRN
  Administered 2014-11-05: 40 mg via INTRAVENOUS

## 2014-11-05 MED ORDER — ACETAMINOPHEN 650 MG RE SUPP
650.0000 mg | Freq: Once | RECTAL | Status: AC
  Administered 2014-11-05: 650 mg via RECTAL

## 2014-11-05 MED ORDER — GABAPENTIN 300 MG PO CAPS
300.0000 mg | ORAL_CAPSULE | Freq: Every day | ORAL | Status: DC
  Administered 2014-11-05 – 2014-11-06 (×2): 300 mg via ORAL
  Filled 2014-11-05 (×3): qty 1

## 2014-11-05 MED ORDER — METHYLPREDNISOLONE SODIUM SUCC 125 MG IJ SOLR
125.0000 mg | Freq: Once | INTRAMUSCULAR | Status: DC
  Filled 2014-11-05: qty 2

## 2014-11-05 MED ORDER — ACETAMINOPHEN 160 MG/5ML PO SOLN
1000.0000 mg | Freq: Four times a day (QID) | ORAL | Status: DC
  Filled 2014-11-05: qty 40

## 2014-11-05 MED ORDER — SODIUM CHLORIDE 0.9 % IV SOLN
1.0000 mL/kg/h | INTRAVENOUS | Status: AC
  Administered 2014-11-05: 1 mL/kg/h via INTRAVENOUS

## 2014-11-05 MED ORDER — 0.9 % SODIUM CHLORIDE (POUR BTL) OPTIME
TOPICAL | Status: DC | PRN
  Administered 2014-11-05: 1000 mL

## 2014-11-05 MED ORDER — PRAVASTATIN SODIUM 40 MG PO TABS
40.0000 mg | ORAL_TABLET | Freq: Every day | ORAL | Status: DC
  Administered 2014-11-05 – 2014-11-06 (×2): 40 mg via ORAL
  Filled 2014-11-05 (×3): qty 1

## 2014-11-05 MED ORDER — GLYCOPYRROLATE 0.2 MG/ML IJ SOLN
INTRAMUSCULAR | Status: AC
  Filled 2014-11-05: qty 2

## 2014-11-05 MED ORDER — METOPROLOL TARTRATE 25 MG/10 ML ORAL SUSPENSION
12.5000 mg | Freq: Two times a day (BID) | ORAL | Status: DC
  Filled 2014-11-05 (×5): qty 5

## 2014-11-05 MED ORDER — ACETAMINOPHEN 160 MG/5ML PO SOLN: 650.0000 mg | Freq: Once | ORAL | Status: AC

## 2014-11-05 MED ORDER — MIDAZOLAM HCL 2 MG/2ML IJ SOLN
INTRAMUSCULAR | Status: AC
  Filled 2014-11-05: qty 2

## 2014-11-05 MED ORDER — ACETAMINOPHEN 500 MG PO TABS
1000.0000 mg | ORAL_TABLET | Freq: Four times a day (QID) | ORAL | Status: DC
  Administered 2014-11-06 – 2014-11-07 (×5): 1000 mg via ORAL
  Filled 2014-11-05 (×9): qty 2

## 2014-11-05 MED ORDER — METOPROLOL TARTRATE 1 MG/ML IV SOLN: 2.5000 mg | INTRAVENOUS | Status: DC | PRN

## 2014-11-05 MED ORDER — INSULIN REGULAR BOLUS VIA INFUSION
0.0000 [IU] | Freq: Three times a day (TID) | INTRAVENOUS | Status: DC
  Administered 2014-11-06: 2 [IU] via INTRAVENOUS
  Filled 2014-11-05: qty 10

## 2014-11-05 MED ORDER — HEPARIN SODIUM (PORCINE) 1000 UNIT/ML IJ SOLN
INTRAMUSCULAR | Status: AC
  Filled 2014-11-05: qty 1

## 2014-11-05 MED ORDER — SODIUM CHLORIDE 0.9 % IV SOLN: Freq: Once | INTRAVENOUS | Status: DC

## 2014-11-05 MED ORDER — PANTOPRAZOLE SODIUM 40 MG PO TBEC
40.0000 mg | DELAYED_RELEASE_TABLET | Freq: Every day | ORAL | Status: DC
  Filled 2014-11-05: qty 1

## 2014-11-05 MED ORDER — ROCURONIUM BROMIDE 50 MG/5ML IV SOLN
INTRAVENOUS | Status: AC
  Filled 2014-11-05: qty 1

## 2014-11-05 MED ORDER — PROTAMINE SULFATE 10 MG/ML IV SOLN
INTRAVENOUS | Status: DC | PRN
  Administered 2014-11-05: 100 mg via INTRAVENOUS

## 2014-11-05 MED ORDER — GLYCOPYRROLATE 0.2 MG/ML IJ SOLN
INTRAMUSCULAR | Status: DC | PRN
  Administered 2014-11-05: 0.4 mg via INTRAVENOUS

## 2014-11-05 MED ORDER — ALBUMIN HUMAN 5 % IV SOLN
INTRAVENOUS | Status: DC | PRN
  Administered 2014-11-05: 11:00:00 via INTRAVENOUS

## 2014-11-05 MED ORDER — ONDANSETRON HCL 4 MG/2ML IJ SOLN
INTRAMUSCULAR | Status: AC
  Filled 2014-11-05: qty 2

## 2014-11-05 MED ORDER — VANCOMYCIN HCL IN DEXTROSE 1-5 GM/200ML-% IV SOLN
1000.0000 mg | Freq: Once | INTRAVENOUS | Status: AC
  Administered 2014-11-05: 1000 mg via INTRAVENOUS
  Filled 2014-11-05: qty 200

## 2014-11-05 MED ORDER — LACTATED RINGERS IV SOLN
INTRAVENOUS | Status: DC | PRN
  Administered 2014-11-05: 11:00:00 via INTRAVENOUS

## 2014-11-05 MED ORDER — STERILE WATER FOR INJECTION IJ SOLN
INTRAMUSCULAR | Status: AC
  Filled 2014-11-05: qty 10

## 2014-11-05 MED ORDER — LACTATED RINGERS IV SOLN: 500.0000 mL | Freq: Once | INTRAVENOUS | Status: AC | PRN

## 2014-11-05 MED ORDER — LEVOFLOXACIN IN D5W 750 MG/150ML IV SOLN
750.0000 mg | INTRAVENOUS | Status: AC
  Administered 2014-11-06: 750 mg via INTRAVENOUS
  Filled 2014-11-05: qty 150

## 2014-11-05 MED ORDER — NITROGLYCERIN IN D5W 200-5 MCG/ML-% IV SOLN
0.0000 ug/min | INTRAVENOUS | Status: DC
  Filled 2014-11-05: qty 250

## 2014-11-05 MED ORDER — SODIUM CHLORIDE 0.9 % IV SOLN: 250.0000 mL | INTRAVENOUS | Status: DC | PRN

## 2014-11-05 MED ORDER — PROPOFOL 10 MG/ML IV BOLUS
INTRAVENOUS | Status: DC | PRN
  Administered 2014-11-05: 140 mg via INTRAVENOUS

## 2014-11-05 MED ORDER — SODIUM CHLORIDE 0.9 % IJ SOLN
3.0000 mL | Freq: Two times a day (BID) | INTRAMUSCULAR | Status: DC
  Administered 2014-11-05: 3 mL via INTRAVENOUS
  Administered 2014-11-06: 10 mL via INTRAVENOUS

## 2014-11-05 MED ORDER — ONDANSETRON HCL 4 MG/2ML IJ SOLN
INTRAMUSCULAR | Status: DC | PRN
  Administered 2014-11-05: 4 mg via INTRAVENOUS

## 2014-11-05 MED ORDER — PROPOFOL 10 MG/ML IV BOLUS
INTRAVENOUS | Status: AC
  Filled 2014-11-05: qty 20

## 2014-11-05 MED ORDER — METHYLPREDNISOLONE SODIUM SUCC 125 MG IJ SOLR
INTRAMUSCULAR | Status: AC
  Administered 2014-11-05: 125 mg via INTRAVENOUS
  Filled 2014-11-05: qty 2

## 2014-11-05 MED ORDER — ASPIRIN EC 81 MG PO TBEC
81.0000 mg | DELAYED_RELEASE_TABLET | ORAL | Status: DC
  Administered 2014-11-06 – 2014-11-07 (×2): 81 mg via ORAL
  Filled 2014-11-05 (×3): qty 1

## 2014-11-05 MED ORDER — LIDOCAINE HCL (CARDIAC) 20 MG/ML IV SOLN
INTRAVENOUS | Status: AC
Start: 2014-11-05 — End: 2014-11-05
  Filled 2014-11-05: qty 5

## 2014-11-05 MED ORDER — POTASSIUM CHLORIDE 10 MEQ/50ML IV SOLN
10.0000 meq | INTRAVENOUS | Status: AC
  Administered 2014-11-05 (×3): 10 meq via INTRAVENOUS

## 2014-11-05 MED ORDER — FENTANYL CITRATE 0.05 MG/ML IJ SOLN
INTRAMUSCULAR | Status: AC
  Filled 2014-11-05: qty 2

## 2014-11-05 MED ORDER — PHENYLEPHRINE HCL 10 MG/ML IJ SOLN
0.0000 ug/min | INTRAVENOUS | Status: DC
  Filled 2014-11-05: qty 2

## 2014-11-05 MED ORDER — GABAPENTIN 300 MG PO CAPS
300.0000 mg | ORAL_CAPSULE | Freq: Three times a day (TID) | ORAL | Status: DC
  Filled 2014-11-05 (×2): qty 2

## 2014-11-05 MED ORDER — SODIUM CHLORIDE 0.9 % IV SOLN
INTRAVENOUS | Status: DC
  Administered 2014-11-05: 3.4 [IU]/h via INTRAVENOUS
  Filled 2014-11-05 (×2): qty 2.5

## 2014-11-05 MED ORDER — MORPHINE SULFATE 2 MG/ML IJ SOLN: 2.0000 mg | INTRAMUSCULAR | Status: DC | PRN

## 2014-11-05 MED ORDER — NEOSTIGMINE METHYLSULFATE 10 MG/10ML IV SOLN
INTRAVENOUS | Status: DC | PRN
  Administered 2014-11-05: 3 mg via INTRAVENOUS

## 2014-11-05 MED ORDER — FAMOTIDINE IN NACL 20-0.9 MG/50ML-% IV SOLN
20.0000 mg | Freq: Once | INTRAVENOUS | Status: DC
  Filled 2014-11-05: qty 50

## 2014-11-05 MED ORDER — LIDOCAINE HCL (CARDIAC) 20 MG/ML IV SOLN
INTRAVENOUS | Status: DC | PRN
  Administered 2014-11-05: 30 mg via INTRAVENOUS

## 2014-11-05 MED ORDER — OXYCODONE HCL 5 MG PO TABS: 5.0000 mg | ORAL_TABLET | ORAL | Status: DC | PRN

## 2014-11-05 MED ORDER — HEPARIN SODIUM (PORCINE) 1000 UNIT/ML IJ SOLN
INTRAMUSCULAR | Status: DC | PRN
  Administered 2014-11-05 (×2): 5 mL via INTRAVENOUS

## 2014-11-05 MED ORDER — ONDANSETRON HCL 4 MG/2ML IJ SOLN
4.0000 mg | Freq: Four times a day (QID) | INTRAMUSCULAR | Status: DC | PRN
Start: 2014-11-05 — End: 2014-11-07

## 2014-11-05 MED ORDER — LACTATED RINGERS IV SOLN
INTRAVENOUS | Status: DC
  Administered 2014-11-05 – 2014-11-06 (×2): via INTRAVENOUS

## 2014-11-05 MED ORDER — SODIUM CHLORIDE 0.9 % IV SOLN
1000.0000 ug | INTRAVENOUS | Status: DC | PRN
  Administered 2014-11-05: .5 ug/kg/min via INTRAVENOUS
  Administered 2014-11-05: .125 ug/kg/min via INTRAVENOUS

## 2014-11-05 MED ORDER — MIDAZOLAM HCL 2 MG/2ML IJ SOLN: 2.0000 mg | INTRAMUSCULAR | Status: DC | PRN

## 2014-11-05 MED ORDER — METOPROLOL TARTRATE 12.5 MG HALF TABLET
12.5000 mg | ORAL_TABLET | Freq: Two times a day (BID) | ORAL | Status: DC
  Administered 2014-11-06: 12.5 mg via ORAL
  Filled 2014-11-05 (×5): qty 1

## 2014-11-05 MED ORDER — SODIUM CHLORIDE 0.9 % IJ SOLN: 3.0000 mL | INTRAMUSCULAR | Status: DC | PRN

## 2014-11-05 MED ORDER — DEXMEDETOMIDINE HCL IN NACL 200 MCG/50ML IV SOLN: 0.1000 ug/kg/h | INTRAVENOUS | Status: DC

## 2014-11-05 SURGICAL SUPPLY — 130 items
ADAPTER CARDIOPLEGIA (MISCELLANEOUS) IMPLANT
ANTEGRADE CPLG (MISCELLANEOUS) IMPLANT
ATTRACTOMAT 16X20 MAGNETIC DRP (DRAPES) IMPLANT
BAG BANDED W/RUBBER/TAPE 36X54 (MISCELLANEOUS) ×6 IMPLANT
BAG DECANTER FOR FLEXI CONT (MISCELLANEOUS) IMPLANT
BAG SNAP BAND KOVER 36X36 (MISCELLANEOUS) ×9 IMPLANT
BLADE 10 SAFETY STRL DISP (BLADE) ×3 IMPLANT
BLADE STERNUM SYSTEM 6 (BLADE) IMPLANT
BLADE SURG ROTATE 9660 (MISCELLANEOUS) IMPLANT
CABLE PACING FASLOC BIEGE (MISCELLANEOUS) ×6 IMPLANT
CABLE PACING FASLOC BLUE (MISCELLANEOUS) IMPLANT
CANISTER SUCTION 2500CC (MISCELLANEOUS) IMPLANT
CANNULA FEM VENOUS REMOTE 22FR (CANNULA) IMPLANT
CANNULA FEMORAL ART 14 SM (MISCELLANEOUS) IMPLANT
CANNULA GUNDRY RCSP 15FR (MISCELLANEOUS) IMPLANT
CANNULA OPTISITE PERFUSION 16F (CANNULA) IMPLANT
CANNULA OPTISITE PERFUSION 18F (CANNULA) IMPLANT
CANNULA SOFTFLOW AORTIC 7M21FR (CANNULA) IMPLANT
CANNULA VENOUS LOW PROF 34X46 (CANNULA) IMPLANT
CATH DIAG EXPO 6F VENT PIG 145 (CATHETERS) ×6 IMPLANT
CATH EXPO 5FR AL1 (CATHETERS) ×3 IMPLANT
CATH HEART VENT LEFT (CATHETERS) IMPLANT
CATH S G BIP PACING (SET/KITS/TRAYS/PACK) ×3 IMPLANT
CATH SOFT-VU ST 4F 90CM (CATHETERS) ×3 IMPLANT
CLIP TI MEDIUM 24 (CLIP) ×3 IMPLANT
CLIP TI WIDE RED SMALL 24 (CLIP) ×3 IMPLANT
CONN ST 1/4X3/8  BEN (MISCELLANEOUS)
CONN ST 1/4X3/8 BEN (MISCELLANEOUS) IMPLANT
CONNECTOR 1/2X3/8X1/2 3 WAY (MISCELLANEOUS)
CONNECTOR 1/2X3/8X1/2 3WAY (MISCELLANEOUS) IMPLANT
CONT SPEC 4OZ CLIKSEAL STRL BL (MISCELLANEOUS) ×3 IMPLANT
COVER DOME SNAP 22 D (MISCELLANEOUS) ×6 IMPLANT
COVER MAYO STAND STRL (DRAPES) ×3 IMPLANT
COVER PROBE W GEL 5X96 (DRAPES) IMPLANT
COVER SURGICAL LIGHT HANDLE (MISCELLANEOUS) ×3 IMPLANT
COVER TABLE BACK 60X90 (DRAPES) ×3 IMPLANT
CRADLE DONUT ADULT HEAD (MISCELLANEOUS) ×3 IMPLANT
DERMABOND ADVANCED (GAUZE/BANDAGES/DRESSINGS) ×1
DERMABOND ADVANCED .7 DNX12 (GAUZE/BANDAGES/DRESSINGS) ×2 IMPLANT
DRAIN CHANNEL 28F RND 3/8 FF (WOUND CARE) IMPLANT
DRAIN CHANNEL 32F RND 10.7 FF (WOUND CARE) IMPLANT
DRAPE INCISE IOBAN 66X45 STRL (DRAPES) IMPLANT
DRAPE SLUSH/WARMER DISC (DRAPES) ×3 IMPLANT
DRAPE TABLE COVER HEAVY DUTY (DRAPES) ×3 IMPLANT
DRSG TEGADERM 4X4.75 (GAUZE/BANDAGES/DRESSINGS) ×6 IMPLANT
ELECT REM PT RETURN 9FT ADLT (ELECTROSURGICAL) ×6
ELECTRODE REM PT RTRN 9FT ADLT (ELECTROSURGICAL) ×4 IMPLANT
FELT TEFLON 6X6 (MISCELLANEOUS) ×3 IMPLANT
FEMORAL VENOUS CANN RAP (CANNULA) IMPLANT
GAUZE SPONGE 4X4 12PLY STRL (GAUZE/BANDAGES/DRESSINGS) ×3 IMPLANT
GLOVE BIO SURGEON STRL SZ 6 (GLOVE) ×6 IMPLANT
GLOVE BIO SURGEON STRL SZ 6.5 (GLOVE) ×6 IMPLANT
GLOVE BIO SURGEON STRL SZ7 (GLOVE) ×3 IMPLANT
GLOVE BIOGEL PI IND STRL 6 (GLOVE) ×2 IMPLANT
GLOVE BIOGEL PI IND STRL 6.5 (GLOVE) ×6 IMPLANT
GLOVE BIOGEL PI IND STRL 7.0 (GLOVE) ×6 IMPLANT
GLOVE BIOGEL PI INDICATOR 6 (GLOVE) ×1
GLOVE BIOGEL PI INDICATOR 6.5 (GLOVE) ×3
GLOVE BIOGEL PI INDICATOR 7.0 (GLOVE) ×3
GLOVE ECLIPSE 7.5 STRL STRAW (GLOVE) ×3 IMPLANT
GLOVE ECLIPSE 8.0 STRL XLNG CF (GLOVE) ×6 IMPLANT
GLOVE EUDERMIC 7 POWDERFREE (GLOVE) ×3 IMPLANT
GLOVE ORTHO TXT STRL SZ7.5 (GLOVE) ×3 IMPLANT
GOWN STRL REUS W/ TWL LRG LVL3 (GOWN DISPOSABLE) ×10 IMPLANT
GOWN STRL REUS W/ TWL XL LVL3 (GOWN DISPOSABLE) ×14 IMPLANT
GOWN STRL REUS W/TWL LRG LVL3 (GOWN DISPOSABLE) ×5
GOWN STRL REUS W/TWL XL LVL3 (GOWN DISPOSABLE) ×7
GUIDEWIRE SAF TJ AMPL .035X180 (WIRE) ×3 IMPLANT
GUIDEWIRE SAFE TJ AMPLATZ EXST (WIRE) ×3 IMPLANT
GUIDEWIRE STRAIGHT .035 260CM (WIRE) ×3 IMPLANT
HEMOSTAT POWDER SURGIFOAM 1G (HEMOSTASIS) IMPLANT
INSERT FOGARTY 61MM (MISCELLANEOUS) IMPLANT
INSERT FOGARTY SM (MISCELLANEOUS) ×6 IMPLANT
INSERT FOGARTY XLG (MISCELLANEOUS) IMPLANT
KIT BASIN OR (CUSTOM PROCEDURE TRAY) ×3 IMPLANT
KIT DILATOR VASC 18G NDL (KITS) IMPLANT
KIT HEART LEFT (KITS) ×3 IMPLANT
KIT ROOM TURNOVER OR (KITS) ×3 IMPLANT
KIT SUCTION CATH 14FR (SUCTIONS) ×9 IMPLANT
LEAD PACING MYOCARDI (MISCELLANEOUS) IMPLANT
LIQUID BAND (GAUZE/BANDAGES/DRESSINGS) ×3 IMPLANT
NEEDLE PERC 18GX7CM (NEEDLE) ×3 IMPLANT
NS IRRIG 1000ML POUR BTL (IV SOLUTION) ×9 IMPLANT
PACK AORTA (CUSTOM PROCEDURE TRAY) ×3 IMPLANT
PAD ARMBOARD 7.5X6 YLW CONV (MISCELLANEOUS) ×6 IMPLANT
PAD ELECT DEFIB RADIOL ZOLL (MISCELLANEOUS) ×3 IMPLANT
PATCH TACHOSII LRG 9.5X4.8 (VASCULAR PRODUCTS) IMPLANT
SET CANNULATION TOURNIQUET (MISCELLANEOUS) IMPLANT
SHEATH PINNACLE 6F 10CM (SHEATH) ×6 IMPLANT
SPONGE GAUZE 4X4 12PLY STER LF (GAUZE/BANDAGES/DRESSINGS) ×3 IMPLANT
SPONGE LAP 4X18 X RAY DECT (DISPOSABLE) ×3 IMPLANT
STOPCOCK 4 WAY LG BORE MALE ST (IV SETS) ×6 IMPLANT
STOPCOCK MORSE 400PSI 3WAY (MISCELLANEOUS) ×6 IMPLANT
SUT ETHIBOND 2 0 SH (SUTURE) ×1
SUT ETHIBOND 2 0 SH 36X2 (SUTURE) ×2 IMPLANT
SUT ETHIBOND X763 2 0 SH 1 (SUTURE) ×6 IMPLANT
SUT MNCRL AB 3-0 PS2 18 (SUTURE) ×3 IMPLANT
SUT PDS AB 1 CTX 36 (SUTURE) IMPLANT
SUT PROLENE 2 0 MH 48 (SUTURE) IMPLANT
SUT PROLENE 3 0 SH1 36 (SUTURE) IMPLANT
SUT PROLENE 4 0 RB 1 (SUTURE)
SUT PROLENE 4-0 RB1 .5 CRCL 36 (SUTURE) IMPLANT
SUT PROLENE 5 0 C 1 36 (SUTURE) ×9 IMPLANT
SUT PROLENE 6 0 C 1 30 (SUTURE) ×9 IMPLANT
SUT SILK  1 MH (SUTURE) ×1
SUT SILK 1 MH (SUTURE) ×2 IMPLANT
SUT SILK 2 0 SH CR/8 (SUTURE) ×3 IMPLANT
SUT TEM PAC WIRE 2 0 SH (SUTURE) IMPLANT
SUT VIC AB 1 CTX 27 (SUTURE) ×3 IMPLANT
SUT VIC AB 2-0 CT1 27 (SUTURE) ×1
SUT VIC AB 2-0 CT1 TAPERPNT 27 (SUTURE) ×2 IMPLANT
SUT VIC AB 2-0 CTX 36 (SUTURE) IMPLANT
SUT VIC AB 3-0 SH 8-18 (SUTURE) ×6 IMPLANT
SUT VIC AB 3-0 X1 27 (SUTURE) ×3 IMPLANT
SYR 30ML LL (SYRINGE) ×6 IMPLANT
SYR 50ML LL SCALE MARK (SYRINGE) ×3 IMPLANT
SYSTEM SAHARA CHEST DRAIN ATS (WOUND CARE) ×3 IMPLANT
TAPE CLOTH SOFT 2X10 (GAUZE/BANDAGES/DRESSINGS) ×3 IMPLANT
TOWEL OR 17X24 6PK STRL BLUE (TOWEL DISPOSABLE) ×12 IMPLANT
TOWEL OR 17X26 10 PK STRL BLUE (TOWEL DISPOSABLE) ×6 IMPLANT
TRANSDUCER W/STOPCOCK (MISCELLANEOUS) ×6 IMPLANT
TRAY FOLEY IC TEMP SENS 14FR (CATHETERS) ×3 IMPLANT
TUBE SUCT INTRACARD DLP 20F (MISCELLANEOUS) IMPLANT
TUBING HIGH PRESSURE 120CM (CONNECTOR) ×3 IMPLANT
UNDERPAD 30X30 INCONTINENT (UNDERPADS AND DIAPERS) ×3 IMPLANT
VALVE HEART TRANSCATH SZ3 23MM (Prosthesis & Implant Heart) ×3 IMPLANT
VENT LEFT HEART 12002 (CATHETERS)
WATER STERILE IRR 1000ML POUR (IV SOLUTION) ×6 IMPLANT
WIRE .035 3MM-J 145CM (WIRE) ×3 IMPLANT
WIRE AMPLATZ SS-J .035X180CM (WIRE) ×3 IMPLANT

## 2014-11-05 NOTE — Progress Notes (Addendum)
TCTS BRIEF SICU PROGRESS NOTE  Day of Surgery  S/P Procedure(s) (LRB): TRANSCATHETER AORTIC VALVE REPLACEMENT, TRANSFEMORAL (N/A) INTRAOPERATIVE TRANSESOPHAGEAL ECHOCARDIOGRAM (N/A)   Looks great.  Denies pain NSR w/ PAC's, stable BP O2 sats 99-100% UOP adequate  Plan: Continue routine postop  Ekaterini Capitano H 11/05/2014 7:22 PM

## 2014-11-05 NOTE — Anesthesia Preprocedure Evaluation (Signed)
Anesthesia Evaluation    Airway        Dental   Pulmonary former smoker,          Cardiovascular hypertension,     Neuro/Psych    GI/Hepatic   Endo/Other  diabetes  Renal/GU      Musculoskeletal   Abdominal   Peds  Hematology   Anesthesia Other Findings   Reproductive/Obstetrics                             Anesthesia Physical Anesthesia Plan  ASA: IV  Anesthesia Plan: General   Post-op Pain Management:    Induction: Intravenous  Airway Management Planned: Oral ETT  Additional Equipment: Arterial line, CVP, PA Cath, 3D TEE and Ultrasound Guidance Line Placement  Intra-op Plan:   Post-operative Plan: Extubation in OR and Possible Post-op intubation/ventilation  Informed Consent: I have reviewed the patients History and Physical, chart, labs and discussed the procedure including the risks, benefits and alternatives for the proposed anesthesia with the patient or authorized representative who has indicated his/her understanding and acceptance.   Dental advisory given  Plan Discussed with: CRNA and Anesthesiologist  Anesthesia Plan Comments:         Anesthesia Quick Evaluation

## 2014-11-05 NOTE — Op Note (Signed)
CARDIOTHORACIC SURGERY OPERATIVE NOTE  Date of Procedure:  11/05/2014  Preoperative Diagnosis: Severe Aortic Stenosis   Postoperative Diagnosis: Same   Procedure:    Transcatheter Aortic Valve Replacement - Right Transfemoral Approach  Edwards Sapien 3 Transcatheter Heart Valve (size 29 mm, model # F048547, serial # [id number])   Co-Surgeons:  Gaye Pollack, MD and [name], MD  Assistants:   Rexene Alberts, MD and Lauree Chandler, MD  Anesthesiologist:  Roberts Gaudy, MD  Echocardiographer:  Jenkins Rouge, MD  Pre-operative Echo Findings:  severe aortic stenosis  normal left ventricular systolic function   Post-operative Echo Findings:  no paravalvular leak  normal left ventricular systolic function      DETAILS OF THE OPERATIVE PROCEDURE  The majority of the procedure is documented separately in a procedure note by Dr. Burt Knack.   TRANSFEMORAL ACCESS:   A small incision is made in the right groin immediately over the common femoral artery. The subcutaneous tissues are divided with electrocautery and the anterior surface of the common femoral artery is identified. Sharp dissection is utilized to free up the artery proximally and distally and the vessel is encircled with a vessel loop.  A pair of CV-4 Gore-tex sutures are place as diamond-shaped purse-strings on the anterior surface of the femoral artery.  The patient is heparinized systemically and ACT verified > 250 seconds.  The common femoral artery is punctured using an 18 gauge needle and a soft J-tipped guidewire is passed into the common iliac artery under fluoroscopic guidance.  A 6 Fr straight diagnostic catheter is placed over the guidewire and the guidewire is removed.  An Amplatz super stiff guidewire is passed through the sheath into the descending thoracic aorta and the introducing diagnostic catheter is removed.  A 14 F dilator was passed over the guidewire under continuous fluoroscopic guidance,  making certain that the dilator passed easily all of the way into the distal abdominal aorta.  A 16 Fr  Transfemoral E-sheath is passed over the guidewire into the abdominal aorta.  The introducing dilator is removed, the sheath is flushed with heparinized saline, and the sheath is secured to the skin.    FEMORAL SHEATH REMOVAL AND ARTERIAL CLOSURE:  After the completion of successful valve deployment as documented separately by Dr. Burt Knack, the femoral artery sheath is removed and the arteriotomy is closed using the previously placed Gore-tex purse-string sutures. Once the repair has been completed protamine was administered to reverse the anticoagulation. A digitally-subtracted arteriogram is obtained from just above the aortic bifurcation to below the arteriotomy to confirm the integrity of the vascular repair.  The incision is irrigated with saline solution and subsequently closed in multiple layers using absorbable suture.  The skin incision is closed using a subcuticular skin closure.     Gaye Pollack MD 11/05/2014 4:33 PM

## 2014-11-05 NOTE — Interval H&P Note (Signed)
History and Physical Interval Note:  11/05/2014 10:26 AM  [name]  has presented today for surgery, with the diagnosis of SEVERE AS  The various methods of treatment have been discussed with the patient and family. After consideration of risks, benefits and other options for treatment, the patient has consented to  Procedure(s): TRANSCATHETER AORTIC VALVE REPLACEMENT, TRANSFEMORAL (N/A) INTRAOPERATIVE TRANSESOPHAGEAL ECHOCARDIOGRAM (N/A) as a surgical intervention .  The patient's history has been reviewed, patient examined, no change in status, stable for surgery.  I have reviewed the patient's chart and labs.  Questions were answered to the patient's satisfaction.    The patient presents for TAVR via right TF approach. He has been evaluated by our multidisciplinary heart team, including formal consultation with 2 cardiac surgeons. We all agree that in the context of severe symptomatic aortic stenosis, TAVR is appropriate treatment in this patient with multiple comorbid conditions placing him at high risk of conventional surgery. All questions answered. No changes to document since this evaluation outlined above.   [name]

## 2014-11-05 NOTE — Transfer of Care (Signed)
Immediate Anesthesia Transfer of Care Note  Patient: [name]  Procedure(s) Performed: Procedure(s): TRANSCATHETER AORTIC VALVE REPLACEMENT, TRANSFEMORAL (N/A) INTRAOPERATIVE TRANSESOPHAGEAL ECHOCARDIOGRAM (N/A)  Patient Location: SICU  Anesthesia Type:General  Level of Consciousness: awake, alert , oriented and patient cooperative  Airway & Oxygen Therapy: Patient Spontanous Breathing and Patient connected to nasal cannula oxygen  Post-op Assessment: Report given to PACU RN, Post -op Vital signs reviewed and stable and Patient moving all extremities  Post vital signs: Reviewed and stable  Complications: No apparent anesthesia complications

## 2014-11-05 NOTE — Plan of Care (Signed)
Problem: Phase I Progression Outcomes Goal: Pain controlled with appropriate interventions Outcome: Completed/Met Date Met:  11/05/14 Goal: Initial discharge plan identified Outcome: Completed/Met Date Met:  11/05/14 Home with wife Goal: Hemodynamically stable Outcome: Completed/Met Date Met:  11/05/14 Goal: Distal pulses equal to baseline Outcome: Completed/Met Date Met:  11/05/14 Goal: Vascular site scale level 0 - I Vascular Site Scale Level 0: No bruising/bleeding/hematoma Level I (Mild): Bruising/Ecchymosis, minimal bleeding/ooozing, palpable hematoma < 3 cm Level II (Moderate): Bleeding not affecting hemodynamic parameters, pseudoaneurysm, palpable hematoma > 3 cm Level III (Severe) Bleeding which affects hemodynamic parameters or retroperitoneal hemorrhage  Outcome: Completed/Met Date Met:  11/05/14 Goal: Post Cath/PCI return to appropriate Path Outcome: Completed/Met Date Met:  11/05/14 Goal: Other Phase I Outcomes/Goals Outcome: Completed/Met Date Met:  11/05/14

## 2014-11-05 NOTE — Anesthesia Postprocedure Evaluation (Signed)
  Anesthesia Post-op Note  Patient: [name]  Procedure(s) Performed: Procedure(s): TRANSCATHETER AORTIC VALVE REPLACEMENT, TRANSFEMORAL (N/A) INTRAOPERATIVE TRANSESOPHAGEAL ECHOCARDIOGRAM (N/A)  Patient Location: SICU  Anesthesia Type:General  Level of Consciousness: awake, alert  and oriented  Airway and Oxygen Therapy: Patient Spontanous Breathing and Patient connected to nasal cannula oxygen  Post-op Pain: none  Post-op Assessment: Post-op Vital signs reviewed, Patient's Cardiovascular Status Stable, Respiratory Function Stable, Patent Airway and No signs of Nausea or vomiting  Post-op Vital Signs: stable  Last Vitals:  Filed Vitals:   11/05/14 1257  BP:   Pulse: 50  Temp:   Resp:     Complications: No apparent anesthesia complications

## 2014-11-05 NOTE — OR Nursing (Signed)
Blood return with foley insertion, Dr. Cyndia Bent made aware. Left groin sheaths pulled times two. Pressure held for 20 minutes per sheath.

## 2014-11-05 NOTE — H&P (View-Only) (Signed)
[facility] CONSULTATION REPORT  Referring Provider is [name], MD PCP is Donnajean Lopes, MD  Chief Complaint  Patient presents with  . Aortic Stenosis    TAVR CONSULT....completed [place] questionaire this visit...has seen Dr. Cyndia Bent    HPI:  Patient is an 80 year old white male with history of aortic stenosis, coronary artery disease, Hypertension, type 2 diabetes mellitus, hyperlipidemia, and mantle cell lymphoma referred for a second surgical opinion to discuss treatment options for management of severe symptomatic aortic stenosis. The patient has long history of coronary artery disease for which she underwent coronary artery bypass grafting By Dr. Cyndia Bent in 1995. In 2013 the patient Presented with a non-ST segment elevation myocardial infarction. He was found to have high-grade stenosis of vein graft placed to the right coronary system, and this was treated with PCI and stenting using a bare-metal stent. He was noted to have moderate aortic stenosis at the time. Since then the patient has developed progressive symptoms of exertional shortness of breath and fatigue. In August he suffered a syncopal episode while in church. He ruled out for an acute myocardial infarction aired echocardiogram demonstrated significant progression of severity of aortic stenosis with peak velocity across the aortic valve approaching 4 m/sec corresponding to mean transvalvular gradient estimated 42 mmHg.  Left ventricular systolic function was preserved with ejection fraction 55-60%.  Left and right heart catheterization was performed by Dr. Burt Knack. This demonstrated severe native three-vessel coronary artery disease with 100% chronic occlusion of the left anterior descending coronary artery, left circumflex coronary artery, and the distal right coronary artery.  The left internal mammary artery graft was widely patent to the  left anterior descending coronary artery, and the sequential saphenous vein graft to the left circumflex system was widely patent as well. However, there was 95% high-grade restenosis of the stented portion of the proximal vein graft to the right coronary system.  Peaked peak and mean transvalvular gradients measured across the aortic valve were 25 and 23 m mercury, respectively, corresponding to calculated valve area 1.3 cm.  Pulmonary artery pressures were normal. The patient was referred for surgical consultation and seen by Dr. Cyndia Bent who felt the patient was a relatively poor candidate for redo coronary artery bypass grafting and aortic valve replacement.  The patient subsequently underwent CT angiography to determine the anatomical feasibility of transcatheter aortic valve replacement for definitive management of aortic stenosis. Following this he underwent PCI and stenting of the vein graft to right coronary system approximately 2 weeks ago.  The patient presents to the office today for a second surgical opinion to discuss treatment options for management of aortic stenosis.  The patient is married and lives locally on a farm in the [place] area with his wife. He retired from [employer] in [place].  He has been physically active for all of his life until recently. He states that over the last 2-3 years he has experienced significant physical decline primarily because of progressive exertional shortness of breath and generalized fatigue.  The patient now gets short of breath with mild to moderate physical activity and he tires very easily. He denies any symptoms of resting shortness of breath, PND, orthopnea, or lower extremity edema. He has not had any exertional chest pain or chest tightness. He has had some slight dizzy spells and he suffered the syncopal episode in church reported several months ago. Over the last 2 weeks he has not experienced  any significant improvement in symptoms of  exertional shortness of breath since he underwent PCI and stenting. Last week he developed increased productive cough and low-grade fever. He was given a prescription for Tamiflu and some type of antibiotic by his primary care physician. He has not had any subsequent fevers and overall he feels improved although he still has a persistent dry hacking cough.  His physical mobility is limited primarily by exertional shortness of breath and fatigue. He states that he also has some minor difficulty with balance and he walks using a cane. He does not experience chronic pain.    Past Medical History  Diagnosis Date  . CAD (coronary artery disease)     a. s/p CABG 1995;  b. NSTEMI & subsequent BMS to SVG->right PDA 02/03/12.;  c. Cath 02/14/2012 3vd with 4/4 patent grafts and patent stent in vg->pda.  . Carotid stenosis     a. Carotid dopplers 9/83: RICA 38-25%, LICA 0-53%;  b. dopplers 3/14:  97-67% RICA, 3-41% LICA  . HTN (hypertension)   . HLD (hyperlipidemia)   . Asthmatic bronchitis   . BPH (benign prostatic hypertrophy)   . Herpes zoster   . Anemia   . Thrombocytopenia     a. secondary to splenomegaly related to lymphoma with suspicion of bone marrow involvement. (Plavix had to be stopped due to this)  . Aortic stenosis, severe     a. ECHO 07/22/14 Severe AS. Peak and mean gradients of 57 mmHg and 42 mmHg, respectively. Calculated AVA is 0.8-0.9 cm2. Trivial AR. Valve area (VTI): 0.84 cm^2. Valve area (Vmax): 0.86 cm^2.. Aortic root dimension: 42 mm (ED) and aortic root mildly dilated.  Marland Kitchen Splenomegaly   . COPD (chronic obstructive pulmonary disease)   . Peripheral vascular disease     a. s/p L ray amp 10/13;  b. s/p L 2nd toe amp 12/13  . Pulmonary nodule     a. 88mm RUL calcified pulm nodule noted on CT staging for lymphoma - stable 10/2012.  . Leg DVT (deep venous thromboembolism), chronic     a. LE dopplers 5/13, 10/13 and 1/14: chronic DVT involving right mid femoral vein, left mid femoral  vein, and left popliteal vein.  . Osteomyelitis     a. L first foot ray amputation 09/2012  . History of echocardiogram     a. 2D ECHO: 07/22/2014; 55-60%, mild LVG. G1DD. Severe AS. Peak and mean gradients of 57 mmHg and 42 mmHg, respectively. Calculated AVA is 0.8-0.9 cm2. Trivial AR. Valve area (VTI): 0.84 cm^2. Valve area (Vmax): 0.86 cm^2.. Aortic root dimension: 42 mm (ED) and aortic root mildly dilated.  Moderate posterior MAC. Mild MR. Severe LA dilation. Mild TR. PA pressure 30.   Marland Kitchen Heart murmur   . Anginal pain   . Myocardial infarction ?1995  . Pneumonia ?2014  . DM2 (diabetes mellitus, type 2)   . DJD (degenerative joint disease)   . 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 Stage III s/p bendamustine, Rituxan therapy    Past Surgical History  Procedure Laterality Date  . Coronary artery bypass graft  1995  . Shoulder arthroscopy w/ rotator cuff repair Right   . Carpal tunnel release Bilateral   . Incisional hernia repair    . Kidney surgery      "cut me 1/2 in 2 for stones"  . Back surgery    . I&d extremity  09/16/2012    Procedure: IRRIGATION AND DEBRIDEMENT EXTREMITY;  Surgeon:  Wylene Simmer, MD;  Location: [facility];  Service: Orthopedics;  Laterality: Left;  irrigation and debridement and first Ray amputation of left foot  . Amputation  09/16/2012    Procedure: AMPUTATION RAY;  Surgeon: Wylene Simmer, MD;  Location: [facility];  Service: Orthopedics;  Laterality: Left;  first Ray amputation left foot  . Amputation  11/21/2012    Procedure: AMPUTATION RAY;  Surgeon: Wylene Simmer, MD;  Location: [place];  Service: Orthopedics;  Laterality: Left;  LEFT SECOND TOE AMPUTATION  . I&d extremity Left 03/01/2013    Procedure: IRRIGATION AND DEBRIDEMENT EXTREMITY;  Surgeon: Wylene Simmer, MD;  Location: [facility];  Service: Orthopedics;  Laterality: Left;  IRRIGATION  AND  DEBRIDEMENT  LEFT  FOOT  . Amputation Left 12/20/2013    Procedure: AMPUTATION LEFT 5TH TRANSMETATARSAL;   Surgeon: Wylene Simmer, MD;  Location: [place];  Service: Orthopedics;  Laterality: Left;  . Achilles tendon lengthening Left 12/20/2013    Procedure: ACHILLES TENDON LENGTHENING;  Surgeon: Wylene Simmer, MD;  Location: [place];  Service: Orthopedics;  Laterality: Left;  . Hernia repair    . Coronary angioplasty with stent placement  09/2014; 10/14/2014    "?2; 1"  . Cataract extraction w/ intraocular lens  implant, bilateral Bilateral     Family History  Problem Relation Age of Onset  . Coronary artery disease    . Alcohol abuse      History   Social History  . Marital Status: Married    Spouse Name: N/A    Number of Children: N/A  . Years of Education: N/A   Occupational History  . retired [employer]   Social History Main Topics  . Smoking status: Former Smoker -- 1.00 packs/day for 30 years    Types: Cigarettes, Pipe    Quit date: 03/29/1985  . Smokeless tobacco: Former Systems developer    Types: Chew     Comment: "chewed for ~ 1 yr after I quit smoking"  . Alcohol Use: No  . Drug Use: No  . Sexual Activity: Not Currently   Other Topics Concern  . Not on file   Social History Narrative    Current Outpatient Prescriptions  Medication Sig Dispense Refill  . aspirin EC 81 MG tablet Take 81 mg by mouth every morning.     . clopidogrel (PLAVIX) 75 MG tablet Take 1 tablet (75 mg total) by mouth daily. 30 tablet 11  . gabapentin (NEURONTIN) 300 MG capsule Take 300-600 mg by mouth 3 (three) times daily. Take 600 mg in the morning, 600 mg at midday, and 300 mg at bedtime.    . isosorbide mononitrate (IMDUR) 30 MG 24 hr tablet TAKE ONE TABLET BY MOUTH ONCE DAILY 90 tablet 0  . metFORMIN (GLUCOPHAGE) 500 MG tablet Take 500 mg by mouth 2 (two) times daily with a meal.     . nitroGLYCERIN (NITROSTAT) 0.4 MG SL tablet Place 0.4 mg under the tongue every 5 (five) minutes as needed for chest pain.    . pravastatin (PRAVACHOL) 40 MG tablet Take 40 mg by mouth daily.    . predniSONE (DELTASONE) 20 MG  tablet Take 1 tablet (20 mg total) by mouth See admin instructions. 6 tablet 0  . vitamin B-12 (CYANOCOBALAMIN) 500 MCG tablet Take 1,000 mcg by mouth daily.      No current facility-administered medications for this visit.    Allergies  Allergen Reactions  . Losartan Other (See Comments)    Hyperkalemia > 7  . Azithromycin  Itching  . Contrast Media [Iodinated Diagnostic Agents] Rash  . Doxycycline Itching       . Keflex [Cephalexin] Itching  . Macrodantin Itching  . Minocycline Hcl Itching  . Nitrofurantoin Itching  . Zithromax [Azithromycin Dihydrate] Itching      Review of Systems:   General:  decreased appetite, decreasede energy, no weight gain, + weight loss, + low-grade fever last week - resolved  Cardiac:  no chest pain with exertion, no chest pain at rest,  + SOB with mild exertion, no resting SOB, no PND, no orthopnea, no palpitations, no arrhythmia, no atrial fibrillation, no LE edema, + dizzy spells, + syncope  Respiratory:  + shortness of breath, no home oxygen, + recent productive cough - now resolved, + chronic dry cough, no bronchitis, no wheezing, no hemoptysis, no asthma, no pain with inspiration or cough, no sleep apnea, no CPAP at night  GI:   no difficulty swallowing, no reflux, no frequent heartburn, no hiatal hernia, no abdominal pain, no constipation, no diarrhea, no hematochezia, no hematemesis, no melena  GU:   no dysuria,  no frequency, no urinary tract infection, no hematuria, no enlarged prostate, no kidney stones, no kidney disease  Vascular:  no pain suggestive of claudication, no pain in feet, no leg cramps, no varicose veins, no DVT, + non-healing foot ulcer - now s/p amputation left foot  Neuro:   no stroke, no TIA's, no seizures, no headaches, no temporary blindness one eye,   no slurred speech, + peripheral neuropathy, no chronic pain, + mild instability of gait - uses a cane for balace, + mild memory/cognitive dysfunction  Musculoskeletal: mild  arthritis, no joint swelling, no myalgias, mild difficulty walking, mildly decreased mobility   Skin:   no rash, no itching, no skin infections, no pressure sores or ulcerations  Psych:   no anxiety, no depression, no nervousness, no unusual recent stress  Eyes:   no blurry vision, no floaters, no recent vision changes, does not wear glasses or contacts  ENT:   no hearing loss, no loose or painful teeth, + dentures, last saw dentist recently  Hematologic:  + easy bruising, no abnormal bleeding, no clotting disorder, no frequent epistaxis  Endocrine:  + diabetes, does not check CBG's at home           Physical Exam:   BP 145/63 mmHg  Pulse 50  Temp(Src) 97 F (36.1 C) (Oral)  Resp 16  Ht 6' (1.829 m)  Wt 185 lb (83.915 kg)  BMI 25.08 kg/m2  SpO2 98%  General:  Elderly and somewhat frail-appearing  HEENT:  Unremarkable   Neck:   no JVD, no bruits, no adenopathy   Chest:   clear to auscultation, symmetrical breath sounds, no wheezes, no rhonchi   CV:   RRR, grade III/VI crescendo/decrescendo murmur heard best at RSB,  no diastolic murmur  Abdomen:  soft, non-tender, no masses   Extremities:  warm, well-perfused, pulses palpable, no LE edema  Rectal/GU  Deferred  Neuro:   Grossly non-focal and symmetrical throughout  Skin:   Clean and dry, no rashes, no breakdown   Diagnostic Tests:  Transthoracic Echocardiography  Patient:  Daniell, Mancinas MR #:    [id number] Study Date: 07/22/2014 Gender:   M Age:    27 Height:   182.9 cm Weight:   82.3 kg BSA:    2.05 m^2 Pt. Status: Room:    [place], [place]  Sanda Klein, MD SONOGRAPHER Melissa Morford, RDCS PERFORMING  [facility], Inpatient  cc:  ------------------------------------------------------------------- LV EF: 55% -  60%  ------------------------------------------------------------------- Indications:   Aortic stenosis  424.1.  ------------------------------------------------------------------- History:  PMH:  Syncope and dyspnea. Coronary artery disease. Stroke. Risk factors: Hypertension. Diabetes mellitus. Dyslipidemia.  ------------------------------------------------------------------- Study Conclusions  - Left ventricle: The cavity size was normal. Wall thickness was increased in a pattern of mild LVH. Systolic function was normal. The estimated ejection fraction was in the range of 55% to 60%. Images were inadequate for LV wall motion assessment. Doppler parameters are consistent with abnormal left ventricular relaxation (grade 1 diastolic dysfunction). The E/e&' ratio is between 8-15, suggesting indeterminate LV filling pressure. - Aortic valve: Moderately calcified leafelts with reduced excursion. There is severe aortic stenosis. Peak and mean gradients of 57 mmHg and 42 mmHg, respectively. Based on an LVOT diameter of 2.0 cm, the calculated AVA is 0.8-0.9 cm2. There was trivial regurgitation. Valve area (VTI): 0.84 cm^2. Valve area (Vmax): 0.86 cm^2. - Aorta: Aortic root dimension: 42 mm (ED). - Aortic root: The aortic root is mildly dilated. - Mitral valve: Moderate posterior MAC. There was mild regurgitation. - Left atrium: Severely dilated (37 cm2). - Right atrium: Severely dilated. - Tricuspid valve: There was mild regurgitation. - Pulmonary arteries: PA peak pressure: 30 mm Hg (S). - Inferior vena cava: The vessel was normal in size. The respirophasic diameter changes were in the normal range (= 50%), consistent with normal central venous pressure.  Impressions:  - Compared to the prior echo in 3.2015, the EF has normalized. The aortic valve gradient has increased as well and the mean gradient is around 40 mmHg. This suggests &quot;true&quot; aortic stenosis, which is severe - AVA of 0.8-0.9  cm2.  ------------------------------------------------------------------- Labs, prior tests, procedures, and surgery: ECG.   Abnormal. Transthoracic echocardiography. M-mode, complete 2D, spectral Doppler, and color Doppler. Birthdate: Patient birthdate: [birth year]. Age: Patient is 80 yr old. Sex: Gender: male. BMI: 24.6 kg/m^2. Blood pressure:   136/60 Patient status: Inpatient. Study date: Study date: 07/22/2014. Study time: 10:15 AM. Location: Echo laboratory.  -------------------------------------------------------------------  ------------------------------------------------------------------- Left ventricle: The cavity size was normal. Wall thickness was increased in a pattern of mild LVH. Systolic function was normal. The estimated ejection fraction was in the range of 55% to 60%. Images were inadequate for LV wall motion assessment. Doppler parameters are consistent with abnormal left ventricular relaxation (grade 1 diastolic dysfunction). The E/e&' ratio is between 8-15, suggesting indeterminate LV filling pressure.  ------------------------------------------------------------------- Aortic valve: Moderately calcified leafelts with reduced excursion. There is severe aortic stenosis. Peak and mean gradients of 57 mmHg and 42 mmHg, respectively. Based on an LVOT diameter of 2.0 cm, the calculated AVA is 0.8-0.9 cm2. Doppler: There was trivial regurgitation.  Valve area (VTI): 0.84 cm^2. Indexed valve area (VTI): 0.41 cm^2/m^2. Valve area (Vmax): 0.86 cm^2. Indexed valve area (Vmax): 0.42 cm^2/m^2.  Mean gradient (S): 43 mm Hg. Peak gradient (S): 57 mm Hg.  ------------------------------------------------------------------- Aorta: Aortic root: The aortic root is mildly dilated. Ascending aorta: The ascending aorta was normal in size.  ------------------------------------------------------------------- Mitral valve: Moderate posterior MAC.  Doppler: There was mild regurgitation.  Peak gradient (D): 2 mm Hg.  ------------------------------------------------------------------- Left atrium: Severely dilated (37 cm2).  ------------------------------------------------------------------- Right ventricle: The cavity size was normal. Wall thickness was normal. Systolic function was normal.  ------------------------------------------------------------------- Pulmonic valve:  The valve appears to be grossly normal. Doppler: There was no significant regurgitation.  ------------------------------------------------------------------- Tricuspid valve:  Doppler: There was  mild regurgitation.  ------------------------------------------------------------------- Pulmonary artery:  The main pulmonary artery was normal-sized.  ------------------------------------------------------------------- Right atrium: Severely dilated.  ------------------------------------------------------------------- Pericardium: There was no pericardial effusion.  ------------------------------------------------------------------- Systemic veins: Inferior vena cava: The vessel was normal in size. The respirophasic diameter changes were in the normal range (= 50%), consistent with normal central venous pressure.  ------------------------------------------------------------------- Measurements  Left ventricle         Value     02/07/2014 Reference LV ID, ED, PLAX        50  mm    49.9    43 - 52 chordal LV ID, ES, PLAX    (H)   47.5 mm    40.1    23 - 38 chordal LV fx shortening, PLAX (L)   5   %    20     >=29 chordal LV PW thickness, ED      11.7 mm    15.7    --------- IVS/LV PW ratio, ED      1.02      0.79    <=1.3 Stroke volume, 2D       71  ml    ---------- --------- Stroke volume/bsa, 2D     35  ml/m^2  ----------  --------- LV e&', lateral         7.02 cm/s   7.57    --------- LV E/e&', lateral        10.61     7.6    --------- LV e&', medial         7.24 cm/s   6.25    --------- LV E/e&', medial        10.29     9.2    --------- LV e&', average         7.13 cm/s   ---------- --------- LV E/e&', average        10.45     ---------- ---------  Ventricular septum       Value     02/07/2014 Reference IVS thickness, ED       11.9 mm    12.4    ---------  LVOT              Value     02/07/2014 Reference LVOT ID, S           20  mm    21     --------- LVOT area           3.14 cm^2   3.46    ---------  Aortic valve          Value     02/07/2014 Reference Aortic valve peak       379  cm/s   350    --------- velocity, S Aortic valve mean       317  cm/s   282    --------- velocity, S Aortic valve VTI, S      85.8 cm    83.9    --------- Aortic mean gradient,     43  mm Hg  35     --------- S Aortic peak gradient,     57  mm Hg  49     --------- S Aortic valve area, VTI     0.84 cm^2   1.02    --------- Aortic valve area/bsa,     0.41 cm^2/m^2 0.5    --------- VTI Aortic valve area,       0.86 cm^2   1.07    ---------  peak velocity Aortic valve area/bsa,     0.42 cm^2/m^2 0.52    --------- peak velocity  Aorta             Value     02/07/2014 Reference Aortic root ID, ED       42  mm    38     ---------  Left atrium          Value     02/07/2014 Reference LA ID, A-P, ES         37  mm    48     --------- LA ID/bsa, A-P         1.8  cm/m^2  2.35    <=2.2  Mitral valve          Value      02/07/2014 Reference Mitral E-wave peak       74.5 cm/s   57.5    --------- velocity Mitral A-wave peak       104  cm/s   104    --------- velocity Mitral deceleration      158  ms    326    150 - 230 time Mitral peak gradient,     2   mm Hg  ---------- --------- D Mitral E/A ratio, peak     0.7      0.6    ---------  Pulmonary arteries       Value     02/07/2014 Reference PA pressure, S, DP       30  mm Hg  ---------- <=30  Tricuspid valve        Value     02/07/2014 Reference Tricuspid regurg peak     261  cm/s   251    --------- velocity Tricuspid peak RV-RA      27  mm Hg  25     --------- gradient Tricuspid maximal       261  cm/s   ---------- --------- regurg velocity, PISA  Systemic veins         Value     02/07/2014 Reference Estimated CVP         3   mm Hg  3     ---------  Right ventricle        Value     02/07/2014 Reference RV pressure, S, DP       30  mm Hg  28     <=30 RV s&', lateral, S       12.8 cm/s   14.3    ---------  Legend: (L) and (H) mark values outside specified reference range.  ------------------------------------------------------------------- Prepared and Electronically Authenticated by  Zoila Shutter MD 2015-08-17T11:37:22    Cardiac Catheterization Procedure Note  Name: DONOVIN KRAEMER MRN: [id number] DOB: [birth year]  Procedure: Right Heart Cath, Left Heart Cath, Selective Coronary Angiography, LIMA angiography, saphenous vein graft angiography, LV angiography  Indication: Severe symptomatic aortic stenosis, coronary artery disease status post remote CABG and PCI.  Procedural Details: The right groin was prepped, draped, and anesthetized with 1% lidocaine. Using the modified Seldinger technique a 7 French  sheath was placed in the right femoral vein and a 5 French sheath was placed in the right femoral artery. A Swan-Ganz catheter was used for the right heart catheterization. Standard protocol was followed for recording of right heart pressures and sampling of oxygen saturations. Fick cardiac output was calculated. Standard Judkins catheters were used for selective coronary angiography and left ventriculography.  His saphenous vein graft angiography was performed with a JR 4 catheter for the sequence graft to the left circumflex branches and an RCB cath for the saphenous vein graft to RCA. The LIMA was injected with the JR 4 catheter. The aortic valve was crossed using an AL2 (AL-1 would probably fit better) catheter and straight-tip wire. There were no immediate procedural complications. The patient was transferred to the post catheterization recovery area for further monitoring.  Procedural Findings: Hemodynamics RA 2 RV 25/2 PA 21/6 mean 10 PCWP mean 6 LV 173/10 AO 148/61 mean 95  Oxygen saturations: PA 75 AO 98  Cardiac Output (Fick) 6.8  Cardiac Index (Fick) 3.3  Aortic valve hemodynamics: Peak peak gradient 25, mean gradient 23, aortic valve area 1.3 cm  Coronary angiography: Coronary dominance: right  Left mainstem: 100% occlusion  Left anterior descending (LAD): Totally occluded from the native circulation. Fills entirely from the LIMA graft.  Left circumflex (LCx): Totally occluded from the native circulation. Fills entirely from the sequence graft to OM1 and OM 2  Right coronary artery (RCA): Severely calcified. The vessel has long segment 70% stenosis in its midportion. It is 100% occluded in the distal portion.  Saphenous vein graft to PDA: The ostium and proximal portion of the graft is stented. The stented segment has sequential 95% stenoses. The remaining portions of the bypass graft are patent with mild diffuse disease noted in the distal body of the vein  graft. The PDA insertion site appears patent. The PLA and distal RCA filled retrograde from graft flow.  Saphenous vein graft sequential to OM1 and OM 2: Widely patent throughout. There are 3 major OM branches filling from this graft.  LIMA to LAD: Widely patent throughout. The distal anastomosis is widely patent. The proximal LAD fills retrograde.  Left ventriculography: LV function appears low normal, with an LVEF estimated at 50-55%. The mitral annulus is severely calcified. The aortic valve is severely calcified.  Left ventriculography: Left ventricular systolic function is normal, LVEF is estimated at 55-65%, there is no significant mitral regurgitation   Estimated Blood Loss: Minimal  Final Conclusions:  1. Severe native three-vessel disease with total occlusion of the RCA and total occlusion of the left main 2. Status post aortocoronary bypass surgery with continued patency of the LIMA to LAD and sequential saphenous vein graft to OM1 and OM 2 3. Severe in-stent restenosis in the saphenous vein graft to PDA 4. Low normal LV systolic function with normal right heart cardiac filling pressures 5. Moderate to severe aortic stenosis  Recommendations: The patient's aortic stenosis is severe by echo criteria. His valve is heavily calcified with restricted opening of all leaflets. He also has high-grade obstructive disease in the stented segment of the RCA graft. Will refer to Dr. Laneta Simmers for consideration of further treatment options, which includes TAVR/PCI versus redo CABG/AVR  [name] MD, [facility] 09/23/2014, 8:46 AM     CARDIAC CATH NOTE  Name: [facility] MRN: [id number] DOB: May 26, 1927  Procedure: PTCA and stenting of the SVG-PDA (severe in-stent restenosis)  Indication: Angina at Rest  Procedural Details: The left wrist was prepped, draped, and anesthetized with 1% lidocaine. Using the modified Seldinger technique, a 6 Fr sheath was introduced into the radial artery.  3 mg verapamil was administered through the radial sheath. Weight-based heparin was given for anticoagulation. Once a therapeutic ACT was achieved, a 6 [place] MPA guide catheter was inserted. A Cougar coronary guidewire was used to cross the lesion. The lesion  was predilated with a 2.5 mm [place] balloon. This was followed by a 3.0 mm Flextome Cutting Balloon. Following cutting balloon angioplasty there was mild residual stenosis and I was concerned his risk of recurrent restenosis would be high without stenting. The lesion was then stented with a 3.0x20 mm Promus DES. The stent was postdilated with a 3.5 mm noncompliant balloon to 20 atm. Following PCI, there was 20% residual stenosis and TIMI-3 flow. Final angiography confirmed an excellent result. The patient tolerated the procedure well. There were no immediate procedural complications. A TR band was used for radial hemostasis. The patient was transferred to the post catheterization recovery area for further monitoring.  Lesion Data: Vessel: SVG-PDA Percent stenosis (pre): 95 TIMI-flow (pre): 3 Stent: 3.0x20 mm Promus DES Percent stenosis (post): 20 TIMI-flow (post): 3  Contrast: 45 cc  Radiation dose/Fluoro time: 6.5 minutes  Estimated Blood Loss: minimal  Conclusions: Successful PCI of the SVG-PDA with a single DES  Recommendations: ASA/plavix x 12 months. Tentative plans for TAVR next month.  [name] MD, [facility] 10/14/2014, 1:56 PM      Cardiac TAVR CT  TECHNIQUE: The patient was scanned on a Philips 256 scanner. A 120 kV retrospective scan was triggered in the descending thoracic aorta at 111 HU's. Gantry rotation speed was 270 msecs and collimation was .9 mm. 7.5 mg of iv Metoprolol and no nitro were given. The 3D data set was reconstructed in 5% intervals of the R-R cycle. Systolic and diastolic phases were analyzed on a dedicated work station using MPR, MIP and VRT modes. The patient received 80 cc of  contrast.  FINDINGS: Aortic Valve: Trileaflet, moderately calcified and thickened with severely reduced leaflet opening.  Aorta: Mildly diffusely calcified, no dissection, no aneurysm.  Sinotubular Junction: 31 x 30 mm  Ascending Thoracic Aorta: 32 x 31 mm  Aortic Arch: 30 x 38 mm  Descending Thoracic Aorta: 28 x 28 mm  Sinus of Valsalva Measurements:  Non-coronary: 37 mm  Right -coronary: 37 mm  Left -coronary: 38 mm  Coronary Artery Height above Annulus:  Left Main: 13 mm  Right Coronary: 17 mm  Virtual Basal Annulus Measurements:  Maximum/Minimum Diameter: 31 x 26 mm  Perimeter: 99 mm  Area: 618 mm2  Coronary Arteries: The study wasn't intended for coronary evaluation as performed without use of NTG.  Optimum Fluoroscopic Angle for Delivery: RAO 0 CRA 0  Sheath to tip distance: 63 mm  IMPRESSION: 1. Trileaflet, moderately calcified and thickened with severely reduced leaflet opening with annular measurements suitable for delivery of 29 mm Edward-SAPIEN XT valve.  2. Optimum Fluoroscopic Angle for Delivery: RAO 0 CRA 0.  3. Sufficient coronary to annular distance.  Tobias Alexander   Electronically Signed  By: Tobias Alexander  On: 10/09/2014 12:02      Study Result     EXAM: OVER-READ INTERPRETATION CT CHEST  The following report is an over-read performed by radiologist Dr. Eliezer Mccoy [facility] on 10/09/2014. This over-read does not include interpretation of cardiac or coronary anatomy or pathology. The coronary calcium score/coronary CTA interpretation by the cardiologist is attached.  COMPARISON: Multiple priors, including 07/02/2013 and 10/16/2012  FINDINGS: Evaluation of the lung parenchyma is constrained by respiratory motion.  10 x 6 mm nodular opacity in the medial right upper lobe (series 412/image 8), previously 11 x 6 mm, unchanged.  6 x 6 mm  nodular opacity in the superior segment right lower lobe (series 412/image 36), previously 8 x 7 mm, grossly  unchanged.  Although the current study is motion degraded, approximate two year stability has been demonstrated.  Dependent atelectasis in the bilateral lower lobes. Underlying emphysematous changes. No pleural effusion or pneumothorax.  No suspicious thoracic lymphadenopathy.  Degenerative changes of the thoracic spine.  IMPRESSION: Stable right lung nodules, with at least two year stability, as described above.  Otherwise, no significant extracardiac findings.  Electronically Signed: By: Julian Hy M.D. On: 10/09/2014 11:35     CTA ABDOMEN AND PELVIS WITH CONTRAST  TECHNIQUE: Multidetector CT imaging of the abdomen and pelvis was performed using the standard protocol during bolus administration of intravenous contrast. Multiplanar reconstructed images and MIPs were obtained and reviewed to evaluate the vascular anatomy.  CONTRAST: 52m OMNIPAQUE IOHEXOL 350 MG/ML SOLN  COMPARISON: CT of the chest, abdomen and pelvis 07/02/2013.  FINDINGS: CT ABDOMEN AND PELVIS FINDINGS  Lower chest: Small amount of scarring in the lung bases bilaterally. Mild cardiomegaly. Atherosclerotic calcifications in the distal left anterior descending and right coronary arteries. Severe calcifications of the mitral annulus. Postoperative changes of CABG are noted, with a saphenous vein graft to the distal RCA noted posterior to the right atrium.  Hepatobiliary: The liver has a slightly shrunken appearance and nodular contour, with mild caudate lobe hypertrophy, suggestive of early changes of cirrhosis. No focal cystic or solid hepatic lesions. Specifically, no definite hypervascular hepatic lesion. Calcified granuloma in segment 8 of the liver incidentally noted. No intra or extrahepatic biliary ductal dilatation. Gallbladder is normal in  appearance.  Pancreas: Unremarkable.  Spleen: The spleen is enlarged measuring 13.4 x 7.7 x 16.3 cm (estimated splenic volume of 841 mL).  Adrenals/Urinary Tract: Several sub cm low-attenuation lesions are noted in the kidneys bilaterally (left greater than right), too small to characterize, but favored to represent small cysts. No hydroureteronephrosis or perinephric stranding to indicate urinary tract obstruction at this time. Urinary bladder is normal in appearance. Bilateral adrenal glands are normal in appearance.  Stomach/Bowel: Normal appearance of the stomach. No pathologic dilatation of small bowel or colon. Large diverticulum from the third portion of the duodenum incidentally noted, measuring approximately 4.3 cm in diameter; no surrounding inflammation to suggest associated diverticulitis. There are numerous colonic diverticulae, particularly in the distal descending colon and proximal sigmoid colon, without surrounding inflammatory changes to suggest an diverticulitis at this time. Normal appendix (retrocecal in position).  Vascular/Lymphatic: Findings and measurements pertinent to potential TAVR procedure, as detailed below. Incidental note is also made of an accessory renal artery to the lower pole of the left kidney. Single right renal artery. 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 Portal vein is mildly dilated measuring up to 17 mm in diameter. Immediately inferior to the mid transverse colon (image 52 of series 401) there is a 1.1 x 1.8 cm soft tissue attenuation lesion, favored to represent a mildly enlarged lymph node. In retrospect, this is unchanged compared to prior study 07/02/2013, and favored to be benign. No other pathologically enlarged lymph nodes are noted in the abdomen or pelvis.  Reproductive: Prostate gland is unremarkable in appearance.  Other: No significant volume of ascites. No  pneumoperitoneum.  Musculoskeletal: There are no aggressive appearing lytic or blastic lesions noted in the visualized portions of the skeleton.  VASCULAR MEASUREMENTS PERTINENT TO TAVR:  AORTA:  Minimal Aortic Diameter - 13 x 14 mm  Severity of Aortic Calcification - moderate  RIGHT PELVIS:  Right Common Iliac Artery -  Minimal Diameter - 8.1 x 7.7 mm  Tortuosity -  mild  Calcification - moderate  Right External Iliac Artery -  Minimal Diameter - 7.6 x 7.2 mm  Tortuosity - moderate  Calcification - none  Right Common Femoral Artery -  Minimal Diameter - 8.4 x 7.7 mm  Tortuosity - mild  Calcification - mild  LEFT PELVIS:  Left Common Iliac Artery -  Minimal Diameter - 8.4 x 9.9 mm  Tortuosity - mild  Calcification - mild  Left External Iliac Artery -  Minimal Diameter - 9.0 x 7.9 mm  Tortuosity - moderate  Calcification - minimal  Left Common Femoral Artery -  Minimal Diameter - 7.3 x 6.2 mm  Tortuosity - mild  Calcification - mild  Review of the MIP images confirms the above findings.  IMPRESSION: 1. Vascular findings and measurements pertinent to potential TAVR procedure, as detailed above. This patient does appear to have suitable pelvic arterial access. 2. The appearance of the liver is compatible with underlying cirrhosis. No hypervascular lesion is noted at this time to suggest the presence of a hepatocellular carcinoma. However, there is stigmata of portal hypertension, including mild portal vein dilatation and splenomegaly. 3. Additional incidental findings, as detailed above.   Electronically Signed  By: Vinnie Langton M.D.  On: 10/08/2014 14:29    STS Risk Calculator  Procedure    Redo CABG + AVR  Risk of Mortality   11.2% Morbidity or Mortality  42.2% Prolonged LOS   22.0% Short LOS    11.3% Permanent Stroke   3.1% Prolonged Vent Support  30.3% DSW Infection    0.7% Renal  Failure    14.8% Reoperation    17.1%   Impression:  Patient has stage D severe symptomatic aortic stenosis with preserved left ventricular systolic function and symptoms consistent with chronic diastolic congestive heart failure, New York Heart Association functional class II-III.  He also had a recent syncopal episode.  I have personally reviewed the patient's most recent transthoracic echocardiogram and cardiac catheterization. The patient has thickening and moderate to severe restriction involving all 3 leaflets of the aortic valve.  The patient has severe three-vessel coronary artery disease with chronic occlusion of all 3 native coronary arteries.  All bypass grafts from previous coronary artery bypass surgery remain patent, although the vein graft to the right coronary system required recent repeat PCI and stenting using a bare metal stent approximately 2 weeks ago. Risks associated with conventional surgical aortic valve replacement would be high because of the patient's advanced age and numerous comorbid medical problems. His functional decline over the past 2 or 3 years has been considerable, and under the circumstances I would not consider him a candidate for conventional surgical aortic valve replacement. I agree that transcatheter aortic valve replacement would be a very reasonable alternative. Based upon review of the patient's CT angiograms the patient appears to be a good candidate for transcatheter aortic valve replacement via transfemoral approach.   Plan:  The patient and his wife were counseled at length regarding treatment alternatives for management of severe symptomatic aortic stenosis. Alternative approaches such as conventional aortic valve replacement, transcatheter aortic valve replacement, and palliative medical therapy were compared and contrasted at length.  The risks associated with conventional surgical aortic valve replacement were been discussed in detail, as were  expectations for post-operative convalescence. Long-term prognosis with medical therapy was discussed. This discussion was placed in the context of the patient's own specific clinical presentation and past medical history.  Following the decision to proceed with transcatheter aortic valve replacement, a  discussion has been held regarding what types of management strategies would be attempted intraoperatively in the event of life-threatening complications, including whether or not the patient would be considered a candidate for the use of cardiopulmonary bypass and/or conversion to open sternotomy for attempted surgical intervention.  The patient has been advised of a variety of complications that might develop including but not limited to risks of death, stroke, paravalvular leak, aortic dissection or other major vascular complications, aortic annulus rupture, device embolization, cardiac rupture or perforation, mitral regurgitation, acute myocardial infarction, arrhythmia, heart block or bradycardia requiring permanent pacemaker placement, congestive heart failure, respiratory failure, renal failure, pneumonia, infection, other late complications related to structural valve deterioration or migration, or other complications that might ultimately cause a temporary or permanent loss of functional independence or other long term morbidity.  The patient provides full informed consent for the procedure as described and all questions were answered.   I spent in excess of 90 minutes during the conduct of this office consultation and >50% of this time involved direct face-to-face encounter with the patient for counseling and/or coordination of their care.   Valentina Gu. Roxy Manns, MD 10/28/2014 11:37 AM

## 2014-11-05 NOTE — Op Note (Signed)
[facility]  TAVR OPERATIVE NOTE   Date of Procedure:  11/05/2014  Preoperative Diagnosis: Severe Aortic Stenosis   Postoperative Diagnosis: Same   Procedure:    Transcatheter Aortic Valve Replacement - Transfemoral Approach  Edwards Sapien 3 THV (size 29 mm, model # F048547, serial # [id number])   Co-Surgeons:  Gilford Raid, MD and [name], MD  Assistants:   Darylene Price, MD and Lauree Chandler, MD  Anesthesiologist:  Roberts Gaudy, MD  Echocardiographer:  Jenkins Rouge, MD  Pre-operative Echo Findings:  Severe aortic stenosis  Normal left ventricular systolic function  Post-operative Echo Findings:  No paravalvular leak  normal left ventricular systolic function  BRIEF CLINICAL NOTE AND INDICATIONS FOR SURGERY  [name] presents for TAVR for treatment of severe symptomatic aortic stenosis. He has been evaluated extensively by our Multidisciplinary Heart Team extensively as outlined in previous notes.   During the course of the patient's preoperative work up they have been evaluated comprehensively by a multidisciplinary team of specialists coordinated through the [facility] in the [facility].  They have been demonstrated to suffer from symptomatic severe aortic stenosis as noted above. The patient has been counseled extensively as to the relative risks and benefits of all options for the treatment of severe aortic stenosis including long term medical therapy, conventional surgery for aortic valve replacement, and transcatheter aortic valve replacement.  The patient has been independently evaluated by two cardiac surgeons including Dr Roxy Manns and Dr. Cyndia Bent, and they are felt to be at high risk for conventional surgical aortic valve replacement based upon a predicted risk of mortality using the Society of Thoracic Surgeons risk calculator of 11.2%. Both surgeons indicated the patient would be a poor  candidate for conventional surgery (predicted risk of mortality >15% and/or predicted risk of permanent morbidity >50%) because of comorbidities including previous CABG, advance age, left first ray amputation, diabetes, and mantle cell lymphoma.   Based upon review of all of the patient's preoperative diagnostic tests they are felt to be candidate for transcatheter aortic valve replacement using the transfemoral approach as an alternative to high risk conventional surgery.    Following the decision to proceed with transcatheter aortic valve replacement, a discussion has been held regarding what types of management strategies would be attempted intraoperatively in the event of life-threatening complications, including whether or not the patient would be considered a candidate for the use of cardiopulmonary bypass and/or conversion to open sternotomy for attempted surgical intervention.  The patient has been advised of a variety of complications that might develop peculiar to this approach including but not limited to risks of death, stroke, paravalvular leak, aortic dissection or other major vascular complications, aortic annulus rupture, device embolization, cardiac rupture or perforation, acute myocardial infarction, arrhythmia, heart block or bradycardia requiring permanent pacemaker placement, congestive heart failure, respiratory failure, renal failure, pneumonia, infection, other late complications related to structural valve deterioration or migration, or other complications that might ultimately cause a temporary or permanent loss of functional independence or other long term morbidity.  The patient provides full informed consent for the procedure as described and all questions were answered preoperatively.  DETAILS OF THE OPERATIVE PROCEDURE  PREPARATION:    The patient is brought to the operating room on the above mentioned date and central monitoring was established by the anesthesia team including  placement of Swan-Ganz catheter and radial arterial line. The patient is placed in the supine position on the operating table.  Intravenous antibiotics are administered. General endotracheal anesthesia is induced uneventfully. A Foley catheter is placed.  Baseline transesophageal echocardiogram was performed. The patient's chest, abdomen, both groins, and both lower extremities are prepared and draped in a sterile manner. A time out procedure is performed.   PERIPHERAL ACCESS:    Using the modified Seldinger technique, femoral arterial and venous access was obtained with placement of 6 Fr sheaths on the left side.  A pigtail diagnostic catheter was passed through the left femoral arterial sheath under fluoroscopic guidance into the aortic root.  A temporary transvenous pacemaker catheter was passed through the left femoral venous sheath under fluoroscopic guidance into the right ventricle.  The pacemaker was tested to ensure stable lead placement and pacemaker capture. Aortic root angiography was performed in order to determine the optimal angiographic angle for valve deployment.   TRANSFEMORAL ACCESS:   A right femoral arterial cutdown was performed by Dr Cyndia Bent. Please see his separate operative note for details. The patient was heparinized systemically and ACT verified > 250 seconds.    A 16 Fr transfemoral E-sheath was introduced into the right femoral artery after progressively dilating over an Amplatz superstiff wire. An AL-1 catheter was used to direct a straight-tip exchange length wire across the native aortic valve into the left ventricle. This was exchanged out for a pigtail catheter and position was confirmed in the LV apex.  Simultaneous LV and Ao pressures were recorded.  The pigtail catheter was then exchanged for an Amplatz Extra-stiff wire in the LV apex. At that point, BAV was performed using a 25 mm valvuloplasty balloon.  Once optimal position was achieved, BAV was done under rapid  ventricular pacing at 180 bpm. The patient recovered well hemodynamically.   TRANSCATHETER HEART VALVE DEPLOYMENT:  An Edwards Sapien 3 THV (size 29 mm) was prepared and crimped per manufacturer's guidelines, and the proper orientation of the valve is confirmed on the [employer] delivery system. The valve was advanced through the introducer sheath using normal technique until in an appropriate position in the abdominal aorta beyond the sheath tip. The balloon was then retracted and using the fine-tuning wheel was centered on the valve. The valve was then advanced across the aortic arch using appropriate flexion of the catheter. The valve was carefully positioned across the aortic valve annulus. The Commander catheter was retracted using normal technique. Once final position of the valve has been confirmed by angiographic assessment, the valve is deployed while temporarily holding ventilation and during rapid ventricular pacing to maintain systolic blood pressure < 50 mmHg and pulse pressure < 10 mmHg. The balloon inflation is held for >3 seconds after reaching full deployment volume. Once the balloon has fully deflated the balloon is retracted into the ascending aorta and valve function is assessed using TEE. There is felt to be trivial paravalvular leak and no central aortic insufficiency.  The patient's hemodynamic recovery following valve deployment is good.  The deployment balloon and guidewire are both removed. Echo demostrated acceptable post-procedural gradients, stable mitral valve function, and trivial AI.   PROCEDURE COMPLETION:  The sheath was then removed and arteriotomy repaired by Dr Cyndia Bent. Please see his separate report for details. Distal abdominal aortography was performed to evaluate for any arterial injury related to the procedure. There was no evidence dissection, perforation, or other vascular injury in the abdominal aorta, iliac artery, or femoral artery.  Protamine was  administered once femoral arterial repair was complete. The temporary pacemaker, pigtail catheters and femoral sheaths were  removed with manual pressure used for hemostasis.   The patient tolerated the procedure well and is transported to the surgical intensive care in stable condition. There were no immediate intraoperative complications. All sponge instrument and needle counts are verified correct at completion of the operation.   No blood products were administered during the operation.  The patient received a total of 81 mL of intravenous contrast during the procedure.  [name] MD 11/05/2014 1:38 PM

## 2014-11-05 NOTE — Progress Notes (Signed)
Utilization Review Completed.Donne Anon T12/12/2013

## 2014-11-05 NOTE — Progress Notes (Signed)
  Echocardiogram Echocardiogram Transesophageal has been performed.  [name] 11/05/2014, 1:47 PM

## 2014-11-06 ENCOUNTER — Inpatient Hospital Stay (HOSPITAL_COMMUNITY): Payer: Medicare Other

## 2014-11-06 ENCOUNTER — Encounter (HOSPITAL_COMMUNITY): Payer: Self-pay | Admitting: Cardiovascular Disease

## 2014-11-06 DIAGNOSIS — I350 Nonrheumatic aortic (valve) stenosis: Secondary | ICD-10-CM

## 2014-11-06 DIAGNOSIS — Z954 Presence of other heart-valve replacement: Secondary | ICD-10-CM

## 2014-11-06 DIAGNOSIS — I359 Nonrheumatic aortic valve disorder, unspecified: Secondary | ICD-10-CM

## 2014-11-06 LAB — GLUCOSE, CAPILLARY
GLUCOSE-CAPILLARY: 112 mg/dL — AB (ref 70–99)
GLUCOSE-CAPILLARY: 113 mg/dL — AB (ref 70–99)
GLUCOSE-CAPILLARY: 118 mg/dL — AB (ref 70–99)
GLUCOSE-CAPILLARY: 122 mg/dL — AB (ref 70–99)
GLUCOSE-CAPILLARY: 124 mg/dL — AB (ref 70–99)
GLUCOSE-CAPILLARY: 125 mg/dL — AB (ref 70–99)
GLUCOSE-CAPILLARY: 94 mg/dL (ref 70–99)
Glucose-Capillary: 102 mg/dL — ABNORMAL HIGH (ref 70–99)
Glucose-Capillary: 106 mg/dL — ABNORMAL HIGH (ref 70–99)
Glucose-Capillary: 106 mg/dL — ABNORMAL HIGH (ref 70–99)
Glucose-Capillary: 109 mg/dL — ABNORMAL HIGH (ref 70–99)
Glucose-Capillary: 111 mg/dL — ABNORMAL HIGH (ref 70–99)
Glucose-Capillary: 111 mg/dL — ABNORMAL HIGH (ref 70–99)
Glucose-Capillary: 114 mg/dL — ABNORMAL HIGH (ref 70–99)
Glucose-Capillary: 116 mg/dL — ABNORMAL HIGH (ref 70–99)
Glucose-Capillary: 120 mg/dL — ABNORMAL HIGH (ref 70–99)
Glucose-Capillary: 125 mg/dL — ABNORMAL HIGH (ref 70–99)
Glucose-Capillary: 132 mg/dL — ABNORMAL HIGH (ref 70–99)
Glucose-Capillary: 99 mg/dL (ref 70–99)

## 2014-11-06 LAB — BASIC METABOLIC PANEL
Anion gap: 13 (ref 5–15)
BUN: 18 mg/dL (ref 6–23)
CALCIUM: 8.9 mg/dL (ref 8.4–10.5)
CO2: 22 meq/L (ref 19–32)
CREATININE: 0.87 mg/dL (ref 0.50–1.35)
Chloride: 106 mEq/L (ref 96–112)
GFR calc Af Amer: 89 mL/min — ABNORMAL LOW (ref >90)
GFR, EST NON AFRICAN AMERICAN: 77 mL/min — AB (ref >90)
Glucose, Bld: 109 mg/dL — ABNORMAL HIGH (ref 70–99)
Potassium: 4.2 mEq/L (ref 3.7–5.3)
Sodium: 141 mEq/L (ref 137–147)

## 2014-11-06 LAB — CBC
HCT: 29.5 % — ABNORMAL LOW (ref 39.0–52.0)
Hemoglobin: 9.6 g/dL — ABNORMAL LOW (ref 13.0–17.0)
MCH: 31 pg (ref 26.0–34.0)
MCHC: 32.5 g/dL (ref 30.0–36.0)
MCV: 95.2 fL (ref 78.0–100.0)
Platelets: 64 10*3/uL — ABNORMAL LOW (ref 150–400)
RBC: 3.1 MIL/uL — AB (ref 4.22–5.81)
RDW: 15 % (ref 11.5–15.5)
WBC: 3.3 10*3/uL — ABNORMAL LOW (ref 4.0–10.5)

## 2014-11-06 LAB — MAGNESIUM: Magnesium: 1.8 mg/dL (ref 1.5–2.5)

## 2014-11-06 MED ORDER — INSULIN DETEMIR 100 UNIT/ML ~~LOC~~ SOLN
10.0000 [IU] | Freq: Once | SUBCUTANEOUS | Status: AC
  Administered 2014-11-06: 10 [IU] via SUBCUTANEOUS
  Filled 2014-11-06: qty 0.1

## 2014-11-06 MED ORDER — INSULIN DETEMIR 100 UNIT/ML ~~LOC~~ SOLN
10.0000 [IU] | Freq: Every day | SUBCUTANEOUS | Status: DC
  Administered 2014-11-07: 10 [IU] via SUBCUTANEOUS
  Filled 2014-11-06 (×2): qty 0.1

## 2014-11-06 MED ORDER — INSULIN ASPART 100 UNIT/ML ~~LOC~~ SOLN: 0.0000 [IU] | Freq: Three times a day (TID) | SUBCUTANEOUS | Status: DC

## 2014-11-06 MED FILL — Sodium Chloride IV Soln 0.9%: INTRAVENOUS | Qty: 2000 | Status: AC

## 2014-11-06 MED FILL — Potassium Chloride Inj 2 mEq/ML: INTRAVENOUS | Qty: 40 | Status: AC

## 2014-11-06 MED FILL — Norepinephrine Bitartrate IV Soln 1 MG/ML (Base Equivalent): INTRAVENOUS | Qty: 4 | Status: AC

## 2014-11-06 MED FILL — Magnesium Sulfate Inj 50%: INTRAMUSCULAR | Qty: 10 | Status: AC

## 2014-11-06 MED FILL — Dextrose Inj 5%: INTRAVENOUS | Qty: 250 | Status: AC

## 2014-11-06 MED FILL — Heparin Sodium (Porcine) Inj 1000 Unit/ML: INTRAMUSCULAR | Qty: 30 | Status: AC

## 2014-11-06 NOTE — Addendum Note (Signed)
Addendum  created 11/06/14 2146 by Roberts Gaudy, MD   Modules edited: Notes Section   Notes Section:  File: [id number]; Pend: [id number]; Pend: [id number]

## 2014-11-06 NOTE — Progress Notes (Addendum)
    Subjective:  No CP or SOB.  Objective:  Vital Signs in the last 24 hours: Temp:  [96.1 F (35.6 C)-98.1 F (36.7 C)] 97.9 F (36.6 C) (12/02 0400) Pulse Rate:  [46-85] 55 (12/02 0700) Resp:  [0-33] 14 (12/02 0700) BP: (101-170)/(17-67) 140/46 mmHg (12/02 0700) SpO2:  [94 %-100 %] 98 % (12/02 0700) Arterial Line BP: (108-160)/(29-58) 132/39 mmHg (12/02 0700) Weight:  [185 lb (83.915 kg)-186 lb 4.6 oz (84.5 kg)] 186 lb 4.6 oz (84.5 kg) (12/02 0537)  Intake/Output from previous day: 12/01 0701 - 12/02 0700 In: 3205.4 [I.V.:2555.4; IV Piggyback:650] Out: 1800 [Urine:1650; Blood:150]  Physical Exam: Pt is alert and oriented, elderly male, sitting in chair, NAD HEENT: normal Neck: JVP - normal Lungs: CTA bilaterally CV: RRR with soft systolic murmur at RUSB, no diastolic murmur Abd: soft, NT, Positive BS, no hepatomegaly Ext: no C/C/E, distal pulses intact and equal, bilateral groin sites clear/soft with dressings in place Skin: warm/dry no rash   Lab Results:  Recent Labs  11/05/14 2020 11/06/14 0350  WBC 2.5* 3.3*  HGB 10.2* 9.6*  PLT 62* 64*    Recent Labs  11/05/14 2019 11/05/14 2020 11/06/14 0350  NA 140  --  141  K 4.1  --  4.2  CL 106  --  106  CO2  --   --  22  GLUCOSE 128*  --  109*  BUN 19  --  18  CREATININE 0.90 0.80 0.87   No results for input(s): TROPONINI in the last 72 hours.  Invalid input(s): CK, MB  Cardiac Studies: 2D Echo pending  Tele: Sinus rhythm, sinus brady  Assessment/Plan:  1. Severe aortic stenosis s/p TAVR POD #1 2. Post-op anemia, mild, expected 3. Thrombocytopenia - baseline plt 75-100, now 64 post-TAVR. Repeat in am and continue ASA/plavix as long as plt > 50,000 s/p TAVR and recent DES 4. HTN - controlled 5. Type 2 DM - CBG's 106-124 on insulin gtt. Will transition to sub Q insulin regimen today and anticipate back on Metformin at discharge  Dispo: 2D Echo, cardiac rehab, d/c lines, tx tele bed today. Likely  d/c home tomorrow. Repeat CBC for plt count in am.  [name], M.D. 11/06/2014, 7:30 AM

## 2014-11-06 NOTE — Progress Notes (Signed)
1 Day Post-Op Procedure(s) (LRB): TRANSCATHETER AORTIC VALVE REPLACEMENT, TRANSFEMORAL (N/A) INTRAOPERATIVE TRANSESOPHAGEAL ECHOCARDIOGRAM (N/A) Subjective:  No complaints  Objective: Vital signs in last 24 hours: Temp:  [96.1 F (35.6 C)-98.1 F (36.7 C)] 97.8 F (36.6 C) (12/02 0800) Pulse Rate:  [46-85] 52 (12/02 0800) Cardiac Rhythm:  [-] Sinus bradycardia (12/02 0742) Resp:  [0-33] 16 (12/02 0800) BP: (101-150)/(38-66) 144/50 mmHg (12/02 0800) SpO2:  [94 %-100 %] 98 % (12/02 0800) Arterial Line BP: (108-160)/(29-58) 156/44 mmHg (12/02 0800) Weight:  [84.5 kg (186 lb 4.6 oz)] 84.5 kg (186 lb 4.6 oz) (12/02 0537)  Hemodynamic parameters for last 24 hours: PAP: (15-26)/(2-15) 25/4 mmHg CO:  [3.4 L/min-4.3 L/min] 4.3 L/min CI:  [1.7 L/min/m2-2 L/min/m2] 2 L/min/m2  Intake/Output from previous day: 12/01 0701 - 12/02 0700 In: 3205.4 [I.V.:2555.4; IV Piggyback:650] Out: 1800 [Urine:1650; Blood:150] Intake/Output this shift: Total I/O In: -  Out: 15 [Urine:15]  General appearance: alert and cooperative Neurologic: intact Heart: regular rate and rhythm and soft systolic murmur, no diastolic murmur Lungs: clear to auscultation bilaterally Abdomen: soft, non-tender; bowel sounds normal; no masses,  no organomegaly Extremities: extremities normal, atraumatic, no cyanosis or edema Wound: right groin incision ok\  Lab Results:  Recent Labs  11/05/14 2020 11/06/14 0350  WBC 2.5* 3.3*  HGB 10.2* 9.6*  HCT 30.2* 29.5*  PLT 62* 64*   BMET:  Recent Labs  11/05/14 2019 11/05/14 2020 11/06/14 0350  NA 140  --  141  K 4.1  --  4.2  CL 106  --  106  CO2  --   --  22  GLUCOSE 128*  --  109*  BUN 19  --  18  CREATININE 0.90 0.80 0.87  CALCIUM  --   --  8.9    PT/INR:  Recent Labs  11/05/14 1552  LABPROT 16.1*  INR 1.28   ABG    Component Value Date/Time   PHART 7.397 11/05/2014 1441   HCO3 25.2* 11/05/2014 1441   TCO2 21 11/05/2014 2019   ACIDBASEDEF 1.7  11/01/2014 1015   O2SAT 95.0 11/05/2014 1441   CBG (last 3)   Recent Labs  11/05/14 1856 11/05/14 2156 11/06/14  GLUCAP 106* 106* 125*    Assessment/Plan: S/P Procedure(s) (LRB): TRANSCATHETER AORTIC VALVE REPLACEMENT, TRANSFEMORAL (N/A) INTRAOPERATIVE TRANSESOPHAGEAL ECHOCARDIOGRAM (N/A) He is doing well Expected postop acute blood loss anemia: observe Chronic thrombocytopenia, now 64 postop. Observe. Start ASA/Plavix 2D echo today Diabetes: levemir and SSI ordered. Transfer to 2W and mobilize.   LOS: 1 day    BARTLE,BRYAN K 11/06/2014

## 2014-11-06 NOTE — Progress Notes (Signed)
Echo Lab  2D Echocardiogram completed.  [place], RDCS 11/06/2014 10:41 AM

## 2014-11-06 NOTE — Progress Notes (Signed)
Anesthesiology Follow-up:  Awake and alert, neuro intact, moved to 2W today, walking in halls with assistance.  VS: T- 36.9 BP- 133/44 HR- 62 (SR) RR- 18 O2 sat 95% on RA  K- 4.2 Na-141 BUN/Cr. 18/0.87 H/H- 9.6/29.5 Platelets 64,000  Echo- TAVR Aortic Valve working well, no AI, mean gradient 47 mm hg.  80 year old male S/P TAVR yesterday, chronic thrombocytopenia, doing well, no apparent complications.  Roberts Gaudy, MD

## 2014-11-07 ENCOUNTER — Encounter: Payer: Medicare Other | Admitting: Cardiovascular Disease

## 2014-11-07 ENCOUNTER — Telehealth: Payer: Self-pay | Admitting: Nurse Practitioner

## 2014-11-07 ENCOUNTER — Other Ambulatory Visit: Payer: Self-pay | Admitting: Physician Assistant

## 2014-11-07 ENCOUNTER — Encounter (HOSPITAL_COMMUNITY): Payer: Self-pay | Admitting: Physician Assistant

## 2014-11-07 DIAGNOSIS — I350 Nonrheumatic aortic (valve) stenosis: Principal | ICD-10-CM

## 2014-11-07 DIAGNOSIS — Z952 Presence of prosthetic heart valve: Secondary | ICD-10-CM

## 2014-11-07 DIAGNOSIS — D62 Acute posthemorrhagic anemia: Secondary | ICD-10-CM

## 2014-11-07 LAB — CBC
HCT: 30.3 % — ABNORMAL LOW (ref 39.0–52.0)
Hemoglobin: 9.9 g/dL — ABNORMAL LOW (ref 13.0–17.0)
MCH: 31.8 pg (ref 26.0–34.0)
MCHC: 32.7 g/dL (ref 30.0–36.0)
MCV: 97.4 fL (ref 78.0–100.0)
PLATELETS: 57 10*3/uL — AB (ref 150–400)
RBC: 3.11 MIL/uL — ABNORMAL LOW (ref 4.22–5.81)
RDW: 15.5 % (ref 11.5–15.5)
WBC: 2.7 10*3/uL — ABNORMAL LOW (ref 4.0–10.5)

## 2014-11-07 LAB — GLUCOSE, CAPILLARY
Glucose-Capillary: 115 mg/dL — ABNORMAL HIGH (ref 70–99)
Glucose-Capillary: 112 mg/dL — ABNORMAL HIGH (ref 70–99)

## 2014-11-07 MED ORDER — METFORMIN HCL 500 MG PO TABS: 500.0000 mg | ORAL_TABLET | Freq: Two times a day (BID) | ORAL | Status: DC

## 2014-11-07 MED ORDER — METOPROLOL TARTRATE 25 MG PO TABS: 12.5000 mg | ORAL_TABLET | Freq: Two times a day (BID) | ORAL | Status: DC

## 2014-11-07 NOTE — Progress Notes (Addendum)
     SUBJECTIVE: Feels great. No chest pain or SOB.   BP 137/48 mmHg  Pulse 65  Temp(Src) 98.3 F (36.8 C) (Oral)  Resp 18  Ht 6' (1.829 m)  Wt 193 lb (87.544 kg)  BMI 26.17 kg/m2  SpO2 96%  Intake/Output Summary (Last 24 hours) at 11/07/14 0704 Last data filed at 11/07/14 0500  Gross per 24 hour  Intake  495.4 ml  Output    655 ml  Net -159.6 ml    PHYSICAL EXAM General: Well developed, well nourished, in no acute distress. Alert and oriented x 3.  Psych:  Good affect, responds appropriately Neck: No JVD. No masses noted.  Lungs: Clear bilaterally with no wheezes or rhonci noted.  Heart: RRR with no murmurs noted. Abdomen: Bowel sounds are present. Soft, non-tender.  Extremities: No lower extremity edema.   LABS: Basic Metabolic Panel:  Recent Labs  11/05/14 2019 11/05/14 2020 11/06/14 0350  NA 140  --  141  K 4.1  --  4.2  CL 106  --  106  CO2  --   --  22  GLUCOSE 128*  --  109*  BUN 19  --  18  CREATININE 0.90 0.80 0.87  CALCIUM  --   --  8.9  MG  --  1.8 1.8   CBC:  Recent Labs  11/06/14 0350 11/07/14 0303  WBC 3.3* 2.7*  HGB 9.6* 9.9*  HCT 29.5* 30.3*  MCV 95.2 97.4  PLT 64* 57*   Current Meds: . acetaminophen  1,000 mg Oral 4 times per day   Or  . acetaminophen (TYLENOL) oral liquid 160 mg/5 mL  1,000 mg Per Tube 4 times per day  . aspirin EC  81 mg Oral BH-q7a  . clopidogrel  75 mg Oral Daily  . gabapentin  300 mg Oral QHS  . gabapentin  600 mg Oral BID  . insulin aspart  0-15 Units Subcutaneous TID WC  . insulin detemir  10 Units Subcutaneous Daily  . isosorbide mononitrate  30 mg Oral Daily  . metoprolol tartrate  12.5 mg Oral BID   Or  . metoprolol tartrate  12.5 mg Per Tube BID  . pantoprazole  40 mg Oral Daily  . pravastatin  40 mg Oral q1800   Echo 11/06/14:  Left ventricle: The estimated ejection fraction was 50%. - Aortic valve: TAVR prosthesis is working well. Mean gradient (S): 9 mm Hg. Peak gradient (S): 16 mm  Hg.  ASSESSMENT AND PLAN:  1. Severe aortic stenosis: POD #2 s/p TAVR. Echo 11/06/14 with normal functioning aortic valve prosthesis.   2. Post-op anemia: Stable this am.   3. Thrombocytopenia: baseline platelets 75-100, now 57 post-TAVR. Will continue ASA/plavix as long as plt > 50,000 since he is now s/p TAVR and recent DES  4. HTN: BP controlled  5. Type 2 DM: CBG well controlled. Will restart Metformin tomorrow.   Dispo: D/C home today. He will need 1 week follow up with [facility] street office APP with BMET and CBC that day. His one month f/u with Dr. Burt Knack has been arranged.    MCALHANY,CHRISTOPHER  12/3/20157:04 AM

## 2014-11-07 NOTE — Telephone Encounter (Signed)
New message     TCM appt on 11-13-14 with Ignacia Bayley.

## 2014-11-07 NOTE — Progress Notes (Signed)
[address]       [place] [address]             [phone]      2 Days Post-Op Procedure(s) (LRB): TRANSCATHETER AORTIC VALVE REPLACEMENT, TRANSFEMORAL (N/A) INTRAOPERATIVE TRANSESOPHAGEAL ECHOCARDIOGRAM (N/A) Subjective: Feels very well, no specific complaints  Objective: Vital signs in last 24 hours: Temp:  [97.5 F (36.4 C)-98.5 F (36.9 C)] 98.3 F (36.8 C) (12/03 0500) Pulse Rate:  [48-68] 65 (12/03 0500) Cardiac Rhythm:  [-] Sinus bradycardia (12/02 0742) Resp:  [16-18] 18 (12/03 0500) BP: (98-145)/(44-93) 137/48 mmHg (12/03 0500) SpO2:  [95 %-99 %] 96 % (12/03 0500) Arterial Line BP: (156-163)/(44-57) 163/57 mmHg (12/02 0900) Weight:  [193 lb (87.544 kg)] 193 lb (87.544 kg) (12/03 0500)  Hemodynamic parameters for last 24 hours:    Intake/Output from previous day: 12/02 0701 - 12/03 0700 In: 495.4 [P.O.:240; I.V.:105.4; IV Piggyback:150] Out: 655 [Urine:655] Intake/Output this shift:    General appearance: alert, cooperative and no distress Heart: irregularly irregular rhythm Lungs: somewhat coarse, fair exchange throughout Abdomen: benign Extremities: no edema Wound: groins ok, no hematoma  Lab Results:  Recent Labs  11/06/14 0350 11/07/14 0303  WBC 3.3* 2.7*  HGB 9.6* 9.9*  HCT 29.5* 30.3*  PLT 64* 57*   BMET:  Recent Labs  11/05/14 2019 11/05/14 2020 11/06/14 0350  NA 140  --  141  K 4.1  --  4.2  CL 106  --  106  CO2  --   --  22  GLUCOSE 128*  --  109*  BUN 19  --  18  CREATININE 0.90 0.80 0.87  CALCIUM  --   --  8.9    PT/INR:  Recent Labs  11/05/14 1552  LABPROT 16.1*  INR 1.28   ABG    Component Value Date/Time   PHART 7.397 11/05/2014 1441   HCO3 25.2* 11/05/2014 1441   TCO2 21 11/05/2014 2019   ACIDBASEDEF 1.7 11/01/2014 1015   O2SAT 95.0 11/05/2014 1441   CBG (last 3)   Recent Labs  11/06/14 1620 11/06/14 2200 11/07/14 0550  GLUCAP 120* 116* 115*    Meds Scheduled Meds: .  acetaminophen  1,000 mg Oral 4 times per day   Or  . acetaminophen (TYLENOL) oral liquid 160 mg/5 mL  1,000 mg Per Tube 4 times per day  . aspirin EC  81 mg Oral BH-q7a  . clopidogrel  75 mg Oral Daily  . gabapentin  300 mg Oral QHS  . gabapentin  600 mg Oral BID  . insulin aspart  0-15 Units Subcutaneous TID WC  . insulin detemir  10 Units Subcutaneous Daily  . isosorbide mononitrate  30 mg Oral Daily  . metoprolol tartrate  12.5 mg Oral BID   Or  . metoprolol tartrate  12.5 mg Per Tube BID  . pantoprazole  40 mg Oral Daily  . pravastatin  40 mg Oral q1800   Continuous Infusions:  PRN Meds:.sodium chloride, metoprolol, ondansetron (ZOFRAN) IV, oxyCODONE, traMADol  Xrays Dg Chest Port 1 View  11/06/2014   CLINICAL DATA:  Aortic valve replacement.  EXAM: PORTABLE CHEST - 1 VIEW  COMPARISON:  11/05/2014.  FINDINGS: Right IJ sheath and IJ line noted in superior vena cava. Interim removal Swan-Ganz catheter. Cardiomegaly. Prior CABG. Basilar atelectasis. No pleural effusion or pneumothorax. No acute osseus abnormality .  IMPRESSION: 1. Interim removal of Swan-Ganz catheter. Right IJ sheath and central line in stable position. 2.  Cardiomegaly.  Prior CABG.  No CHF. 3. Mild basilar atelectasis.   Electronically Signed   By: Marcello Moores  Register   On: 11/06/2014 07:39   Dg Chest Port 1 View  11/05/2014   CLINICAL DATA:  80 year old male status post TAVR  EXAM: PORTABLE CHEST - 1 VIEW  COMPARISON:  Preoperative chest x-ray 11/01/2014  FINDINGS: Right IJ vascular sheath conveys a Swan-Ganz catheter into the heart. The tip of the Swan overlies the central right main pulmonary artery.a a right IJ central venous catheter is also present. The tip overlies the mid SVC. Patient is status post median sternotomy with evidence of prior multivessel CABG including LIMA bypass. Additionally, an expandable stent is noted in the expected location of the aortic valve consistent with recent TAVR. Minimal vascular  congestion without edema. The lungs are clear. There may be trace left basilar atelectasis. No pneumothorax or effusion.  IMPRESSION: 1. The tip of the Swan-Ganz catheter overlies the central right pulmonary artery. 2. The tip of the right IJ central venous catheter overlies the mid SVC. 3. Trace left basilar atelectasis and minimal vascular congestion without edema.   Electronically Signed   By: Jacqulynn Cadet M.D.   On: 11/05/2014 15:35    Assessment/Plan: S/P Procedure(s) (LRB): TRANSCATHETER AORTIC VALVE REPLACEMENT, TRANSFEMORAL (N/A) INTRAOPERATIVE TRANSESOPHAGEAL ECHOCARDIOGRAM (N/A)  1 S/P TAVR- doing well, no murmur appreciated 2 having some atrial fibrillation post op- defer to cardiology for management- on ASA/Plavix 3 sugars well controlled 4 platelets 57K- relatively stable thrombocytopenia    LOS: 2 days    Matea Stanard E 11/07/2014

## 2014-11-07 NOTE — Discharge Summary (Signed)
CARDIOLOGY DISCHARGE SUMMARY   Patient ID: DENARIO BAGOT MRN: [id number] DOB/AGE: Jan 20, 1927 80 y.o.  Admit date: 11/05/2014 Discharge date: 11/07/2014  PCP: Donnajean Lopes, MD Primary Cardiologist: Dr. Burt Knack  Primary Discharge Diagnosis:   S/P TAVR (transcatheter aortic valve replacement) Secondary Discharge Diagnosis:    Severe aortic valve stenosis    Thrombocytopenia    Anemia   Hypertension   Diabetes type 2  Consults: Dr. Roxy Manns with TCTS  Procedure: Transcatheter Aortic Valve Replacement - Transfemoral Approach Edwards Sapien 3 THV (size 29 mm, model # F048547, serial # [id number]) 2-D echocardiogram Serial chest x-rays Swan-Ganz catheter  Hospital Course: [name] is a 80 y.o. male with a history of CAD and AS. He was evaluated work TAVR and was having occasional anginal chest pain. He was hospitalized in November 2015 for catheterization and had a DES to the SVG-PDA. He tolerated the procedure well. The evaluation for TAVR was completed and he was felt to be an appropriate candidate. He came to the hospital for the procedure on 11/05/2014.  He had TAVR with an Edwards Sapien valve by the transfemoral approach. He tolerated the procedure well. An echocardiogram was performed the following day, results are below. Serial chest x-rays were also performed but he had no heart failure and the Swan-Ganz catheter was removed in 48 hours.   He was seen by cardiac rehabilitation and able to increase his ambulation successfully. He was followed by the surgical team during his hospital stay as well.  He had some atrial fibrillation postop, and is on aspirin/Plavix. With the recent DES, he is to stay on aspirin and Plavix for now. His platelets were low and dropped some after the procedure. He was having no bleeding issues and because of his DES, he is to stay on aspirin/Plavix as long as his platelet count is greater than 50,000.  He was noted to be  anemic prior to the procedure but is hemoglobin is generally greater than 11. It dropped some after the surgery, but has stabilized. He is having no acute ongoing bleeding issues.  His diabetes was managed with a combination of his home medications and sliding scale insulin. His metformin was held for the procedure, but will be restarted in 48 hours.  He has a history of hypertension but has also had some bradycardia with beta blockers. He was started on a low dose of metoprolol but doses were held due to bradycardia. Therefore, he is not on a beta blocker at discharge.  On 12/03, he was evaluated by the surgical team and by Dr. Angelena Form. No further inpatient workup was indicated and he is considered stable for discharge, with a TOC appointment arranged.  Labs:  Lab Results  Component Value Date   WBC 2.7* 11/07/2014   HGB 9.9* 11/07/2014   HCT 30.3* 11/07/2014   MCV 97.4 11/07/2014   PLT 57* 11/07/2014    Recent Labs Lab 11/01/14 1313  11/06/14 0350  NA 143  < > 141  K 4.4  < > 4.2  CL 106  < > 106  CO2 22  --  22  BUN 17  < > 18  CREATININE 1.07  < > 0.87  CALCIUM 9.8  --  8.9  PROT 6.0  --   --   BILITOT 0.4  --   --   ALKPHOS 79  --   --   ALT 13  --   --   AST 17  --   --  GLUCOSE 98  < > 109*  < > = values in this interval not displayed.   Recent Labs  11/05/14 1552  INR 1.28      Radiology:  Dg Chest Port 1 View 11/06/2014   CLINICAL DATA:  Aortic valve replacement.  EXAM: PORTABLE CHEST - 1 VIEW  COMPARISON:  11/05/2014.  FINDINGS: Right IJ sheath and IJ line noted in superior vena cava. Interim removal Swan-Ganz catheter. Cardiomegaly. Prior CABG. Basilar atelectasis. No pleural effusion or pneumothorax. No acute osseus abnormality .  IMPRESSION: 1. Interim removal of Swan-Ganz catheter. Right IJ sheath and central line in stable position. 2. Cardiomegaly.  Prior CABG.  No CHF. 3. Mild basilar atelectasis.   Electronically Signed   By: Marcello Moores  Register   On:  11/06/2014 07:39   Dg Chest Port 1 View 11/05/2014   CLINICAL DATA:  80 year old male status post TAVR  EXAM: PORTABLE CHEST - 1 VIEW  COMPARISON:  Preoperative chest x-ray 11/01/2014  FINDINGS: Right IJ vascular sheath conveys a Swan-Ganz catheter into the heart. The tip of the Swan overlies the central right main pulmonary artery.a a right IJ central venous catheter is also present. The tip overlies the mid SVC. Patient is status post median sternotomy with evidence of prior multivessel CABG including LIMA bypass. Additionally, an expandable stent is noted in the expected location of the aortic valve consistent with recent TAVR. Minimal vascular congestion without edema. The lungs are clear. There may be trace left basilar atelectasis. No pneumothorax or effusion.  IMPRESSION: 1. The tip of the Swan-Ganz catheter overlies the central right pulmonary artery. 2. The tip of the right IJ central venous catheter overlies the mid SVC. 3. Trace left basilar atelectasis and minimal vascular congestion without edema.   Electronically Signed   By: Jacqulynn Cadet M.D.   On: 11/05/2014 15:35   EKG: 11/06/2014 Sinus rhythm, rate 61  Echo: 11/06/2014 Conclusions - Left ventricle: The estimated ejection fraction was 50%. - Aortic valve: TAVR prosthesis is working well. Mean gradient (S): 9 mm Hg. Peak gradient (S): 16 mm Hg.  FOLLOW UP PLANS AND APPOINTMENTS Allergies  Allergen Reactions  . Losartan Other (See Comments)    Hyperkalemia > 7  . Azithromycin Itching  . Contrast Media [Iodinated Diagnostic Agents] Rash  . Doxycycline Itching       . Keflex [Cephalexin] Itching  . Macrodantin Itching  . Minocycline Hcl Itching  . Nitrofurantoin Itching  . Zithromax [Azithromycin Dihydrate] Itching     Medication List    TAKE these medications        aspirin EC 81 MG tablet  Take 81 mg by mouth every morning.     clopidogrel 75 MG tablet  Commonly known as:  PLAVIX  Take 1 tablet (75 mg  total) by mouth daily.     gabapentin 300 MG capsule  Commonly known as:  NEURONTIN  Take 300-600 mg by mouth 3 (three) times daily. Take 600 mg in the morning, 600 mg at midday, and 300 mg at bedtime.     isosorbide mononitrate 30 MG 24 hr tablet  Commonly known as:  IMDUR  TAKE ONE TABLET BY MOUTH ONCE DAILY     metFORMIN 500 MG tablet  Commonly known as:  GLUCOPHAGE  Take 1 tablet (500 mg total) by mouth 2 (two) times daily with a meal. HOLD today, restart on 11/08/2014.     nitroGLYCERIN 0.4 MG SL tablet  Commonly known as:  NITROSTAT  Place 0.4 mg under  the tongue every 5 (five) minutes as needed for chest pain.     pravastatin 40 MG tablet  Commonly known as:  PRAVACHOL  Take 40 mg by mouth daily.     predniSONE 20 MG tablet  Commonly known as:  DELTASONE  Take 1 tablet (20 mg total) by mouth See admin instructions.     vitamin B-12 500 MCG tablet  Commonly known as:  CYANOCOBALAMIN  Take 1,000 mcg by mouth daily.        Discharge Instructions    Diet - low sodium heart healthy    Complete by:  As directed      Diet Carb Modified    Complete by:  As directed      Increase activity slowly    Complete by:  As directed           Follow-up Information    Follow up with Murray Hodgkins, NP On 11/13/2014.   Specialty:  Nurse Practitioner   Why:  Lab work and office visit at 9:30 am   Contact information:   [address] [address] [phone]       BRING ALL MEDICATIONS WITH YOU TO FOLLOW UP APPOINTMENTS  Time spent with patient to include physician time: 42 min Signed: Rosaria Ferries, PA-C 11/07/2014, 11:59 AM Co-Sign MD

## 2014-11-07 NOTE — Progress Notes (Signed)
CARDIAC REHAB PHASE I   PRE:  Rate/Rhythm: 70 SR los of PACs  BP:  Supine:   Sitting: 130/70  Standing:    SaO2: 96%RA  MODE:  Ambulation: 700 ft   POST:  Rate/Rhythm: 77 SR PACS  BP:  Supine:   Sitting: 130/52  Standing:    SaO2: 98%RA 0847-0920 Pt walked 700 ft on RA with cane and asst x 1 with steady gait. Tolerated well. To recliner after walk. Reviewed walking instructions with pt for exercise. Encouraged IS. Discussed CRP 2 but pt not interested at this time. No complaints.   Graylon Good, RN BSN  11/07/2014 9:16 AM

## 2014-11-07 NOTE — Progress Notes (Signed)
Assessment unchanged. Discussed d/c instructions with patient including: follow up appointment and medication instructions. Patient verbalized understanding. IV and tele removed. Patient left with all belongings accompanied by volunteer.   Earlie Lou

## 2014-11-07 NOTE — Care Management Note (Signed)
    Page 1 of 1   11/07/2014     2:24:02 PM CARE MANAGEMENT NOTE 11/07/2014  Patient:  [name], [name]   Account Number:  [id number]  Date Initiated:  11/06/2014  Documentation initiated by:  Luz Lex  Subjective/Objective Assessment:   Post op TAVR on 12-1.  Lives with wife - wife will be with him on discharge - states she is able to care for him.  States also has daughter that lives close that will be assisting.     Action/Plan:   Anticipated DC Date:  11/08/2014   Anticipated DC Plan:  [place]  CM consult      Choice offered to / List presented to:             Status of service:  Completed, signed off Medicare Important Message given?  NA - LOS <3 / Initial given by admissions (If response is "NO", the following Medicare IM given date fields will be blank) Date Medicare IM given:   Medicare IM given by:   Date Additional Medicare IM given:   Additional Medicare IM given by:    Discharge Disposition:  HOME/SELF CARE  Per UR Regulation:  Reviewed for med. necessity/level of care/duration of stay  If discussed at [place] of Stay Meetings, dates discussed:    Comments:  ContactShia, Eber Spouse [phone]  [phone]    [place],Cindy Daughter [phone]

## 2014-11-08 LAB — TYPE AND SCREEN
ABO/RH(D): O POS
Antibody Screen: NEGATIVE
UNIT DIVISION: 0
UNIT DIVISION: 0
Unit division: 0
Unit division: 0

## 2014-11-08 NOTE — Telephone Encounter (Signed)
Patient contacted regarding discharge from [facility] on 11/07/2014  Patient understands to follow up with provider Ignacia Bayley on 11/13/2014 at 9;30 am Patient understands discharge instructions? yesPatient understands medications and regiment? yes Patient understands to bring all medications to this visit? yes}   patient states cath site in groin is ecchymotic, but no redness, swelling or drainage.

## 2014-11-13 ENCOUNTER — Encounter: Payer: Self-pay | Admitting: Nurse Practitioner

## 2014-11-13 ENCOUNTER — Ambulatory Visit (INDEPENDENT_AMBULATORY_CARE_PROVIDER_SITE_OTHER): Payer: Medicare Other | Admitting: Nurse Practitioner

## 2014-11-13 ENCOUNTER — Other Ambulatory Visit (INDEPENDENT_AMBULATORY_CARE_PROVIDER_SITE_OTHER): Payer: Medicare Other | Admitting: *Deleted

## 2014-11-13 VITALS — BP 130/60 | HR 62 | Ht 72.0 in | Wt 188.0 lb

## 2014-11-13 DIAGNOSIS — Z952 Presence of prosthetic heart valve: Secondary | ICD-10-CM

## 2014-11-13 DIAGNOSIS — Z954 Presence of other heart-valve replacement: Secondary | ICD-10-CM

## 2014-11-13 DIAGNOSIS — D696 Thrombocytopenia, unspecified: Secondary | ICD-10-CM

## 2014-11-13 DIAGNOSIS — E785 Hyperlipidemia, unspecified: Secondary | ICD-10-CM

## 2014-11-13 DIAGNOSIS — I10 Essential (primary) hypertension: Secondary | ICD-10-CM

## 2014-11-13 DIAGNOSIS — I350 Nonrheumatic aortic (valve) stenosis: Secondary | ICD-10-CM

## 2014-11-13 DIAGNOSIS — I25810 Atherosclerosis of coronary artery bypass graft(s) without angina pectoris: Secondary | ICD-10-CM

## 2014-11-13 LAB — BASIC METABOLIC PANEL
BUN: 20 mg/dL (ref 6–23)
CHLORIDE: 102 meq/L (ref 96–112)
CO2: 27 mEq/L (ref 19–32)
Calcium: 9.2 mg/dL (ref 8.4–10.5)
Creatinine, Ser: 0.9 mg/dL (ref 0.4–1.5)
GFR: 85.19 mL/min (ref >60.00)
Glucose, Bld: 118 mg/dL — ABNORMAL HIGH (ref 70–99)
POTASSIUM: 3.8 meq/L (ref 3.5–5.1)
Sodium: 136 mEq/L (ref 135–145)

## 2014-11-13 NOTE — Patient Instructions (Signed)
KEEP YOUR ALREADY SCHEDULED FOLLOW UP APPOINTMENTS. Your physician recommends that you continue on your current medications as directed. Please refer to the Current Medication list given to you today.

## 2014-11-13 NOTE — Progress Notes (Signed)
Patient Name: [name] Date of Encounter: 11/13/2014  Primary Care Provider:  Donnajean Lopes, MD Primary Cardiologist:  Jerilynn Mages. Burt Knack, MD   Patient Profile  80 y/o male s/p recent TAVR who presents for f/u.  Problem List   Past Medical History  Diagnosis Date  . CAD (coronary artery disease)     a. s/p CABG 1995;  b. NSTEMI & subsequent BMS to SVG->right PDA 02/03/12.;  c. Cath 02/14/2012 3vd with 4/4 patent grafts and patent stent in vg->pda;  d. 10/2014 PCI/DES to the VG->PDA.  . Carotid stenosis     a. Carotid dopplers 3/14: RICA 97-02%, LICA 6-37%;  b. dopplers 3/14:  85-88% RICA, 5-02% LICA  . HTN (hypertension)   . HLD (hyperlipidemia)   . Asthmatic bronchitis   . BPH (benign prostatic hypertrophy)   . Herpes zoster   . Anemia   . Thrombocytopenia     a. secondary to splenomegaly related to lymphoma with suspicion of bone marrow involvement. (Plavix had to be stopped due to this)  . Aortic stenosis, severe     a. ECHO 07/22/14 Severe AS. Peak and mean gradients of 57 mmHg and 42 mmHg, respectively. Calculated AVA is 0.8-0.9 cm2. Trivial AR. Valve area (VTI): 0.84 cm^2. Valve area (Vmax): 0.86 cm^2.. Aortic root dimension: 42 mm (ED) and aortic root mildly dilated;  b. 10/2014 S/P TAVR w/ Edwards Sapien 3 THV 45mm;  c. 10/2014 Echo: EF 50%, nl fxning AoV, mean grad of 9, peak of 16.  Marland Kitchen Splenomegaly   . COPD (chronic obstructive pulmonary disease)   . Peripheral vascular disease     a. s/p L ray amp 10/13;  b. s/p L 2nd toe amp 12/13  . Pulmonary nodule     a. 42mm RUL calcified pulm nodule noted on CT staging for lymphoma - stable 10/2012.  . Leg DVT (deep venous thromboembolism), chronic     a. LE dopplers 5/13, 10/13 and 1/14: chronic DVT involving right mid femoral vein, left mid femoral vein, and left popliteal vein.  . Osteomyelitis     a. L first foot ray amputation 09/2012  . Heart murmur   . Anginal pain   . Myocardial infarction ?1995  . Pneumonia ?2014  .  DM2 (diabetes mellitus, type 2)   . DJD (degenerative joint disease)   . 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 Stage III s/p bendamustine, Rituxan therapy  . 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 29 mm Edwards Sapien 3 transcatheter heart valve placed via open right transfemoral approach   Past Surgical History  Procedure Laterality Date  . Coronary artery bypass graft  1995  . Shoulder arthroscopy w/ rotator cuff repair Right   . Carpal tunnel release Bilateral   . Incisional hernia repair    . Kidney surgery      "cut me 1/2 in 2 for stones"  . Back surgery    . I&d extremity  09/16/2012    Procedure: IRRIGATION AND DEBRIDEMENT EXTREMITY;  Surgeon: Wylene Simmer, MD;  Location: [facility];  Service: Orthopedics;  Laterality: Left;  irrigation and debridement and first Ray amputation of left foot  . Amputation  09/16/2012    Procedure: AMPUTATION RAY;  Surgeon: Wylene Simmer, MD;  Location: [facility];  Service: Orthopedics;  Laterality: Left;  first Ray amputation left foot  . Amputation  11/21/2012    Procedure: AMPUTATION RAY;  Surgeon: Wylene Simmer, MD;  Location:  [place] [facility];  Service: Orthopedics;  Laterality: Left;  LEFT SECOND TOE AMPUTATION  . I&d extremity Left 03/01/2013    Procedure: IRRIGATION AND DEBRIDEMENT EXTREMITY;  Surgeon: Wylene Simmer, MD;  Location: [facility];  Service: Orthopedics;  Laterality: Left;  IRRIGATION  AND  DEBRIDEMENT  LEFT  FOOT  . Amputation Left 12/20/2013    Procedure: AMPUTATION LEFT 5TH TRANSMETATARSAL;  Surgeon: Wylene Simmer, MD;  Location: [place];  Service: Orthopedics;  Laterality: Left;  . Achilles tendon lengthening Left 12/20/2013    Procedure: ACHILLES TENDON LENGTHENING;  Surgeon: Wylene Simmer, MD;  Location: [place];  Service: Orthopedics;  Laterality: Left;  . Hernia repair    . Coronary angioplasty with stent placement  09/2014; 10/14/2014    "?2; 1"  . Cataract extraction w/ intraocular lens  implant, bilateral  Bilateral   . Transcatheter aortic valve replacement, transfemoral N/A 11/05/2014    Procedure: TRANSCATHETER AORTIC VALVE REPLACEMENT, TRANSFEMORAL;  Surgeon: [name], MD;  Location: [facility] OR; Berniece Pap 3 THV (size 29 mm, model # F048547, serial # [id number])   . Intraoperative transesophageal echocardiogram N/A 11/05/2014    Procedure: INTRAOPERATIVE TRANSESOPHAGEAL ECHOCARDIOGRAM;  Surgeon: [name], MD;  Location: [facility];  Service: Open Heart Surgery;  Laterality: N/A;    Allergies  Allergies  Allergen Reactions  . Losartan Other (See Comments)    Hyperkalemia > 7  . Azithromycin Itching  . Contrast Media [Iodinated Diagnostic Agents] Rash  . Doxycycline Itching       . Keflex [Cephalexin] Itching  . Macrodantin Itching  . Minocycline Hcl Itching  . Nitrofurantoin Itching  . Zithromax [Azithromycin Dihydrate] Itching    HPI  80 y/o male with the above complex problem list.  He had been having angina in the setting of known VG->PDA dzs and severe Ao Stenosis.  On 11/9, he underwent PCI/DES of the VG->PDA 2/2 rest angina.  He later presented for TAVR, which was performed on 12/1.  He tolerated procedure well and post-op course was notable for brief post-op afib and also thrombocytopenia.  He was d/c'd home on 12/3 and presents for f/u today.  He says that since d/c, he has done exceptionally well.  He is walking w/o chest pain or dyspnea.  His right groin is healing up well also.  He denies chest pain, palpitations, dyspnea, pnd, orthopnea, n, v, dizziness, syncope, edema, weight gain, or early satiety.  His d/c indicates that he would not be on bb therapy 2/2 bradycardia noted during hospitalization but then a Rx was sent to his Rx for lopressor 12.5 mg bid.  He isn't sure if he is supposed to be taking it or not.  He was taken off of bb earlier this year 2/2 bradycardia with HR's in the 30's following a syncopal episode.  Home Medications  Prior to Admission medications    Medication Sig Start Date End Date Taking? Authorizing Provider  aspirin EC 81 MG tablet Take 81 mg by mouth every morning.    Yes Historical Provider, MD  clopidogrel (PLAVIX) 75 MG tablet Take 1 tablet (75 mg total) by mouth daily. 10/15/14  Yes Almyra Deforest, PA  gabapentin (NEURONTIN) 300 MG capsule Take 300-600 mg by mouth 3 (three) times daily. Take 600 mg in the morning, 600 mg at midday, and 300 mg at bedtime. 03/03/13  Yes Haywood Pao, MD  isosorbide mononitrate (IMDUR) 30 MG 24 hr tablet TAKE ONE TABLET BY MOUTH ONCE DAILY 10/25/14  Yes [name], MD  metFORMIN (  GLUCOPHAGE) 500 MG tablet Take 1 tablet (500 mg total) by mouth 2 (two) times daily with a meal. HOLD today, restart on 11/08/2014. 11/07/14  Yes Rhonda G Barrett, PA-C  metoprolol succinate (TOPROL-XL) 25 MG 24 hr tablet Take 25 mg by mouth daily. PT TAKES 1/2 TAB IN AM AND 1/2 PM   Yes Historical Provider, MD  pravastatin (PRAVACHOL) 40 MG tablet Take 40 mg by mouth daily.   Yes Historical Provider, MD  vitamin B-12 (CYANOCOBALAMIN) 500 MCG tablet Take 1,000 mcg by mouth daily.    Yes Historical Provider, MD  nitroGLYCERIN (NITROSTAT) 0.4 MG SL tablet Place 0.4 mg under the tongue every 5 (five) minutes as needed for chest pain.    Historical Provider, MD    Review of Systems  Overall doing very well.  Much improved exercise tolerance.  He denies chest pain, palpitations, dyspnea, pnd, orthopnea, n, v, dizziness, syncope, edema, weight gain, or early satiety.  All other systems reviewed and are otherwise negative except as noted above.  Physical Exam  Blood pressure 130/60, pulse 62, height 6' (1.829 m), weight 188 lb (85.276 kg).  General: Pleasant, NAD Psych: Normal affect. Neuro: Alert and oriented X 3. Moves all extremities spontaneously. HEENT: Normal  Neck: Supple without bruits or JVD. Lungs:  Resp regular and unlabored, CTA. Heart: Irregular, 2/6 SEM RUSB. Abdomen: Soft, non-tender, non-distended, BS + x 4.   Extremities: No clubbing, cyanosis or edema. L transmetatarsal amputation.  PT/Radials 2+ and equal bilaterally.  R groin incision site with well-healing incision surrounded by moderate ecchymosis.  No bleeding, bruit, or hematoma.  Accessory Clinical Findings  ECG - Sinus arrhythmia, 62, inf twi (more pronounced than on prior ecg's)  Assessment & Plan  1.  Severe Ao Stenosis:  S/p TAVR.  He is feeling remarkably better w/o recurrent c/p, doe, presyncope, or syncope.  Groin site is healing well.  He is scheduled for echo on 1/26 @ which time he'll also see Dr. Burt Knack.  2.  CAD:  S/P relatively recent PCI/DES of the VG->PDA.  As above, he is doing well w/o chest pain or dyspnea.  He remains on asa, plavix, statin, and nitrate therapy.  He was rx lopressor @ d/c however d/c summary indicates that this would not be rx 2/2 bradycardia while hospitalized.  He also has a prior h/o syncope in the setting of profound bradycardia.  HR/BP stable today.  I have advised that he not begin metoprolol and I cannot be sure why a Rx was sent when it was clearly documented that it would not be.  3.  HTN:  Stable.  4.  HL:  Cont statin.  5.  Thrombocytopenia:  Noted post-TAVR.  On asa/plavix.  CBC today.  6.  Post-op afib:  No palpitations @ home.  As above, w/ h/o bradycardia, he is not on any AVN blocking agents.  He is currently on asa/plavix.  7.  Dispo:  CBC/BMET today.  F/U Dr. Burt Knack w/ echo on the same day as scheduled for January.   Murray Hodgkins, NP 11/13/2014, 10:14 AM

## 2014-11-14 ENCOUNTER — Encounter (HOSPITAL_COMMUNITY): Payer: Self-pay | Admitting: Cardiovascular Disease

## 2014-11-19 ENCOUNTER — Inpatient Hospital Stay (HOSPITAL_COMMUNITY)
Admission: EM | Admit: 2014-11-19 | Discharge: 2014-11-22 | DRG: 866 | Disposition: A | Discharge status: Home / Self Care | Payer: Medicare Other | Attending: Internal Medicine | Admitting: Internal Medicine

## 2014-11-19 ENCOUNTER — Emergency Department (HOSPITAL_COMMUNITY)
Admission: EM | Admit: 2014-11-19 | Discharge: 2014-11-19 | Disposition: A | Discharge status: Home / Self Care | Payer: Medicare Other | Source: Home / Self Care

## 2014-11-19 ENCOUNTER — Telehealth: Payer: Self-pay | Admitting: Cardiovascular Disease

## 2014-11-19 ENCOUNTER — Encounter (HOSPITAL_COMMUNITY): Payer: Self-pay | Admitting: Emergency Medicine

## 2014-11-19 ENCOUNTER — Emergency Department (HOSPITAL_COMMUNITY): Payer: Medicare Other

## 2014-11-19 DIAGNOSIS — M1990 Unspecified osteoarthritis, unspecified site: Secondary | ICD-10-CM | POA: Diagnosis present

## 2014-11-19 DIAGNOSIS — Z91041 Radiographic dye allergy status: Secondary | ICD-10-CM

## 2014-11-19 DIAGNOSIS — Z888 Allergy status to other drugs, medicaments and biological substances status: Secondary | ICD-10-CM

## 2014-11-19 DIAGNOSIS — Z952 Presence of prosthetic heart valve: Secondary | ICD-10-CM

## 2014-11-19 DIAGNOSIS — E119 Type 2 diabetes mellitus without complications: Secondary | ICD-10-CM | POA: Diagnosis present

## 2014-11-19 DIAGNOSIS — I2510 Atherosclerotic heart disease of native coronary artery without angina pectoris: Secondary | ICD-10-CM | POA: Diagnosis present

## 2014-11-19 DIAGNOSIS — R509 Fever, unspecified: Secondary | ICD-10-CM

## 2014-11-19 DIAGNOSIS — Z951 Presence of aortocoronary bypass graft: Secondary | ICD-10-CM

## 2014-11-19 DIAGNOSIS — D61818 Other pancytopenia: Secondary | ICD-10-CM | POA: Diagnosis present

## 2014-11-19 DIAGNOSIS — B349 Viral infection, unspecified: Secondary | ICD-10-CM | POA: Diagnosis not present

## 2014-11-19 DIAGNOSIS — Z8701 Personal history of pneumonia (recurrent): Secondary | ICD-10-CM

## 2014-11-19 DIAGNOSIS — Z8572 Personal history of non-Hodgkin lymphomas: Secondary | ICD-10-CM

## 2014-11-19 DIAGNOSIS — Z9842 Cataract extraction status, left eye: Secondary | ICD-10-CM

## 2014-11-19 DIAGNOSIS — N400 Enlarged prostate without lower urinary tract symptoms: Secondary | ICD-10-CM | POA: Diagnosis present

## 2014-11-19 DIAGNOSIS — Z881 Allergy status to other antibiotic agents status: Secondary | ICD-10-CM

## 2014-11-19 DIAGNOSIS — I10 Essential (primary) hypertension: Secondary | ICD-10-CM | POA: Diagnosis present

## 2014-11-19 DIAGNOSIS — Z89022 Acquired absence of left finger(s): Secondary | ICD-10-CM

## 2014-11-19 DIAGNOSIS — J449 Chronic obstructive pulmonary disease, unspecified: Secondary | ICD-10-CM | POA: Diagnosis present

## 2014-11-19 DIAGNOSIS — E1159 Type 2 diabetes mellitus with other circulatory complications: Secondary | ICD-10-CM | POA: Diagnosis present

## 2014-11-19 DIAGNOSIS — I252 Old myocardial infarction: Secondary | ICD-10-CM

## 2014-11-19 DIAGNOSIS — Z955 Presence of coronary angioplasty implant and graft: Secondary | ICD-10-CM

## 2014-11-19 DIAGNOSIS — A499 Bacterial infection, unspecified: Secondary | ICD-10-CM | POA: Diagnosis present

## 2014-11-19 DIAGNOSIS — J45909 Unspecified asthma, uncomplicated: Secondary | ICD-10-CM | POA: Diagnosis present

## 2014-11-19 DIAGNOSIS — D759 Disease of blood and blood-forming organs, unspecified: Secondary | ICD-10-CM | POA: Diagnosis present

## 2014-11-19 DIAGNOSIS — Z9841 Cataract extraction status, right eye: Secondary | ICD-10-CM

## 2014-11-19 DIAGNOSIS — E785 Hyperlipidemia, unspecified: Secondary | ICD-10-CM | POA: Diagnosis present

## 2014-11-19 DIAGNOSIS — C8310 Mantle cell lymphoma, unspecified site: Secondary | ICD-10-CM | POA: Diagnosis present

## 2014-11-19 DIAGNOSIS — F17220 Nicotine dependence, chewing tobacco, uncomplicated: Secondary | ICD-10-CM | POA: Diagnosis present

## 2014-11-19 DIAGNOSIS — Z7982 Long term (current) use of aspirin: Secondary | ICD-10-CM

## 2014-11-19 DIAGNOSIS — I38 Endocarditis, valve unspecified: Secondary | ICD-10-CM | POA: Diagnosis present

## 2014-11-19 DIAGNOSIS — I4891 Unspecified atrial fibrillation: Secondary | ICD-10-CM | POA: Diagnosis present

## 2014-11-19 DIAGNOSIS — I739 Peripheral vascular disease, unspecified: Secondary | ICD-10-CM | POA: Diagnosis present

## 2014-11-19 LAB — BASIC METABOLIC PANEL
Anion gap: 14 (ref 5–15)
BUN: 16 mg/dL (ref 6–23)
CALCIUM: 9.3 mg/dL (ref 8.4–10.5)
CHLORIDE: 98 meq/L (ref 96–112)
CO2: 24 mEq/L (ref 19–32)
CREATININE: 0.89 mg/dL (ref 0.50–1.35)
GFR calc non Af Amer: 76 mL/min — ABNORMAL LOW (ref >90)
GFR, EST AFRICAN AMERICAN: 88 mL/min — AB (ref >90)
Glucose, Bld: 159 mg/dL — ABNORMAL HIGH (ref 70–99)
Potassium: 4.2 mEq/L (ref 3.7–5.3)
Sodium: 136 mEq/L — ABNORMAL LOW (ref 137–147)

## 2014-11-19 LAB — URINE MICROSCOPIC-ADD ON

## 2014-11-19 LAB — URINALYSIS, ROUTINE W REFLEX MICROSCOPIC
BILIRUBIN URINE: NEGATIVE
GLUCOSE, UA: NEGATIVE mg/dL
KETONES UR: NEGATIVE mg/dL
Leukocytes, UA: NEGATIVE
Nitrite: NEGATIVE
Protein, ur: 100 mg/dL — AB
Specific Gravity, Urine: 1.018 (ref 1.005–1.030)
Urobilinogen, UA: 1 mg/dL (ref 0.0–1.0)
pH: 6 (ref 5.0–8.0)

## 2014-11-19 LAB — I-STAT TROPONIN, ED: Troponin i, poc: 0.06 ng/mL (ref 0.00–0.08)

## 2014-11-19 LAB — CBC WITH DIFFERENTIAL/PLATELET
Basophils Absolute: 0 10*3/uL (ref 0.0–0.1)
Basophils Relative: 0 % (ref 0–1)
Eosinophils Absolute: 0.1 10*3/uL (ref 0.0–0.7)
Eosinophils Relative: 1 % (ref 0–5)
HEMATOCRIT: 31.1 % — AB (ref 39.0–52.0)
HEMOGLOBIN: 10.2 g/dL — AB (ref 13.0–17.0)
Lymphocytes Relative: 14 % (ref 12–46)
Lymphs Abs: 0.6 10*3/uL — ABNORMAL LOW (ref 0.7–4.0)
MCH: 31.5 pg (ref 26.0–34.0)
MCHC: 32.8 g/dL (ref 30.0–36.0)
MCV: 96 fL (ref 78.0–100.0)
MONO ABS: 0.6 10*3/uL (ref 0.1–1.0)
MONOS PCT: 13 % — AB (ref 3–12)
NEUTROS ABS: 3.3 10*3/uL (ref 1.7–7.7)
Neutrophils Relative %: 72 % (ref 43–77)
Platelets: 61 10*3/uL — ABNORMAL LOW (ref 150–400)
RBC: 3.24 MIL/uL — ABNORMAL LOW (ref 4.22–5.81)
RDW: 15.6 % — ABNORMAL HIGH (ref 11.5–15.5)
WBC: 4.6 10*3/uL (ref 4.0–10.5)

## 2014-11-19 LAB — I-STAT CG4 LACTIC ACID, ED
Lactic Acid, Venous: 1.41 mmol/L (ref 0.5–2.2)
Lactic Acid, Venous: 0.68 mmol/L (ref 0.5–2.2)

## 2014-11-19 LAB — PRO B NATRIURETIC PEPTIDE: PRO B NATRI PEPTIDE: 2133 pg/mL — AB (ref 0–450)

## 2014-11-19 MED ORDER — IBUPROFEN 200 MG PO TABS
400.0000 mg | ORAL_TABLET | Freq: Once | ORAL | Status: AC
  Administered 2014-11-19: 400 mg via ORAL
  Filled 2014-11-19: qty 2

## 2014-11-19 NOTE — Telephone Encounter (Signed)
Patient's wife reports patient developed a fever today of 101.9 with chills. Has some chest congestion and sl cough. She states she called Dr.Paterson's office, but he is out today. She has already given him tylenol. Temp now 102.0. Advised her to call their office back for urgent appointment, or go to an Urgent Care. She voices good understanding.

## 2014-11-19 NOTE — Telephone Encounter (Signed)
New Prob    Pts BP has been elevated today along with a fever of 101.9 at its highest and chills. Wife is concerned and requesting to speak to a nurse.

## 2014-11-19 NOTE — ED Notes (Signed)
Pt. Stated, Im sick, after lunch , I began having chills and fever around 300 and felt nauseated.  Wife  gave him 2 tylenol at 1600.  Also today I've had some congestion more than usual.

## 2014-11-19 NOTE — ED Provider Notes (Signed)
CSN: [id number]     Arrival date & time 11/19/14  1728 History   First MD Initiated Contact with Patient 11/19/14 1930     Chief Complaint  Patient presents with  . Fever  . Nausea      HPI  Pt was seen at 1935. Per pt and his family, c/o gradual onset and persistence of constant home fever to "102" that began today. Has been associated with cough and generalized weakness/fatigue. Pt took tylenol approximately 1600 without improvement. Denies CP/palpitations, no SOB, no back pain, no abd pain, no N/V/D, no rash.   Past Medical History  Diagnosis Date  . CAD (coronary artery disease)     a. s/p CABG 1995;  b. NSTEMI & subsequent BMS to SVG->right PDA 02/03/12.;  c. Cath 02/14/2012 3vd with 4/4 patent grafts and patent stent in vg->pda;  d. 10/2014 PCI/DES to the VG->PDA.  . Carotid stenosis     a. Carotid dopplers 3/53: RICA 29-92%, LICA 4-26%;  b. dopplers 3/14:  83-41% RICA, 9-62% LICA  . HTN (hypertension)   . HLD (hyperlipidemia)   . Asthmatic bronchitis   . BPH (benign prostatic hypertrophy)   . Herpes zoster   . Anemia   . Thrombocytopenia     a. secondary to splenomegaly related to lymphoma with suspicion of bone marrow involvement. (Plavix had to be stopped due to this)  . Aortic stenosis, severe     a. ECHO 07/22/14 Severe AS. Peak and mean gradients of 57 mmHg and 42 mmHg, respectively. Calculated AVA is 0.8-0.9 cm2. Trivial AR. Valve area (VTI): 0.84 cm^2. Valve area (Vmax): 0.86 cm^2.. Aortic root dimension: 42 mm (ED) and aortic root mildly dilated;  b. 10/2014 S/P TAVR w/ Edwards Sapien 3 THV 4mm;  c. 10/2014 Echo: EF 50%, nl fxning AoV, mean grad of 9, peak of 16.  Marland Kitchen Splenomegaly   . COPD (chronic obstructive pulmonary disease)   . Peripheral vascular disease     a. s/p L ray amp 10/13;  b. s/p L 2nd toe amp 12/13  . Pulmonary nodule     a. 69mm RUL calcified pulm nodule noted on CT staging for lymphoma - stable 10/2012.  . Leg DVT (deep venous thromboembolism), chronic      a. LE dopplers 5/13, 10/13 and 1/14: chronic DVT involving right mid femoral vein, left mid femoral vein, and left popliteal vein.  . Osteomyelitis     a. L first foot ray amputation 09/2012  . Heart murmur   . Anginal pain   . Myocardial infarction ?1995  . Pneumonia ?2014  . DM2 (diabetes mellitus, type 2)   . DJD (degenerative joint disease)   . 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 Stage III s/p bendamustine, Rituxan therapy  . 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 29 mm Edwards Sapien 3 transcatheter heart valve placed via open right transfemoral approach   Past Surgical History  Procedure Laterality Date  . Coronary artery bypass graft  1995  . Shoulder arthroscopy w/ rotator cuff repair Right   . Carpal tunnel release Bilateral   . Incisional hernia repair    . Kidney surgery      "cut me 1/2 in 2 for stones"  . Back surgery    . I&d extremity  09/16/2012    Procedure: IRRIGATION AND DEBRIDEMENT EXTREMITY;  Surgeon: Wylene Simmer, MD;  Location: [facility];  Service: Orthopedics;  Laterality: Left;  irrigation and  debridement and first Ray amputation of left foot  . Amputation  09/16/2012    Procedure: AMPUTATION RAY;  Surgeon: Wylene Simmer, MD;  Location: [facility];  Service: Orthopedics;  Laterality: Left;  first Ray amputation left foot  . Amputation  11/21/2012    Procedure: AMPUTATION RAY;  Surgeon: Wylene Simmer, MD;  Location: [place];  Service: Orthopedics;  Laterality: Left;  LEFT SECOND TOE AMPUTATION  . I&d extremity Left 03/01/2013    Procedure: IRRIGATION AND DEBRIDEMENT EXTREMITY;  Surgeon: Wylene Simmer, MD;  Location: [facility];  Service: Orthopedics;  Laterality: Left;  IRRIGATION  AND  DEBRIDEMENT  LEFT  FOOT  . Amputation Left 12/20/2013    Procedure: AMPUTATION LEFT 5TH TRANSMETATARSAL;  Surgeon: Wylene Simmer, MD;  Location: [place];  Service: Orthopedics;  Laterality: Left;  . Achilles tendon lengthening Left 12/20/2013     Procedure: ACHILLES TENDON LENGTHENING;  Surgeon: Wylene Simmer, MD;  Location: [place];  Service: Orthopedics;  Laterality: Left;  . Hernia repair    . Coronary angioplasty with stent placement  09/2014; 10/14/2014    "?2; 1"  . Cataract extraction w/ intraocular lens  implant, bilateral Bilateral   . Transcatheter aortic valve replacement, transfemoral N/A 11/05/2014    Procedure: TRANSCATHETER AORTIC VALVE REPLACEMENT, TRANSFEMORAL;  Surgeon: [name], MD;  Location: [facility] OR; Berniece Pap 3 THV (size 29 mm, model # F048547, serial # [id number])   . Intraoperative transesophageal echocardiogram N/A 11/05/2014    Procedure: INTRAOPERATIVE TRANSESOPHAGEAL ECHOCARDIOGRAM;  Surgeon: [name], MD;  Location: [facility] OR;  Service: Open Heart Surgery;  Laterality: N/A;  . Left and right heart catheterization with coronary/graft angiogram N/A 02/03/2012    Procedure: LEFT AND RIGHT HEART CATHETERIZATION WITH Beatrix Fetters;  Surgeon: [name], MD;  Location: [facility] CATH LAB;  Service: Cardiovascular;  Laterality: N/A;  . Percutaneous stent intervention  02/03/2012    Procedure: PERCUTANEOUS STENT INTERVENTION;  Surgeon: [name], MD;  Location: [facility] CATH LAB;  Service: Cardiovascular;;  . Left heart catheterization with coronary/graft angiogram N/A 02/14/2012    Procedure: LEFT HEART CATHETERIZATION WITH Beatrix Fetters;  Surgeon: Peter M Martinique, MD;  Location: [facility] CATH LAB;  Service: Cardiovascular;  Laterality: N/A;  . Left and right heart catheterization with coronary/graft angiogram N/A 09/23/2014    Procedure: LEFT AND RIGHT HEART CATHETERIZATION WITH Beatrix Fetters;  Surgeon: Blane Ohara, MD;  Location: [facility] CATH LAB;  Service: Cardiovascular;  Laterality: N/A;  . Percutaneous coronary stent intervention (pci-s)  10/14/2014    Procedure: PERCUTANEOUS CORONARY STENT INTERVENTION (PCI-S);  Surgeon: Blane Ohara, MD;  Location: [facility] CATH LAB;  Service:  Cardiovascular;;   Family History  Problem Relation Age of Onset  . Coronary artery disease    . Alcohol abuse     History  Substance Use Topics  . Smoking status: Former Smoker -- 1.00 packs/day for 30 years    Types: Cigarettes, Pipe    Quit date: 03/29/1985  . Smokeless tobacco: Former Systems developer    Types: Chew     Comment: "chewed for ~ 1 yr after I quit smoking"  . Alcohol Use: No    Review of Systems ROS: Statement: All systems negative except as marked or noted in the HPI; Constitutional: +fever and chills, generalized weakness/fatigue. ; ; Eyes: Negative for eye pain, redness and discharge. ; ; ENMT: Negative for ear pain, hoarseness, nasal congestion, sinus pressure and sore throat. ; ; Cardiovascular: Negative for chest pain, palpitations, diaphoresis, dyspnea and peripheral edema. ; ;  Respiratory: +cough. Negative for wheezing and stridor. ; ; Gastrointestinal: Negative for nausea, vomiting, diarrhea, abdominal pain, blood in stool, hematemesis, jaundice and rectal bleeding. . ; ; Genitourinary: Negative for dysuria, flank pain and hematuria. ; ; Musculoskeletal: Negative for back pain and neck pain. Negative for swelling and trauma.; ; Skin: Negative for pruritus, rash, abrasions, blisters, bruising and skin lesion.; ; Neuro: Negative for headache, lightheadedness and neck stiffness. Negative for altered level of consciousness , altered mental status, extremity weakness, paresthesias, involuntary movement, seizure and syncope.     Allergies  Losartan; Azithromycin; Contrast media; Doxycycline; Keflex; Macrodantin; Minocycline hcl; Nitrofurantoin; and Zithromax  Home Medications   Prior to Admission medications   Medication Sig Start Date End Date Taking? Authorizing Provider  aspirin EC 81 MG tablet Take 81 mg by mouth every morning.    Yes Historical Provider, MD  clopidogrel (PLAVIX) 75 MG tablet Take 1 tablet (75 mg total) by mouth daily. 10/15/14  Yes Almyra Deforest, PA  gabapentin  (NEURONTIN) 300 MG capsule Take 300-600 mg by mouth 3 (three) times daily. Take 600 mg in the morning, 600 mg at midday, and 300 mg at bedtime. 03/03/13  Yes Haywood Pao, MD  isosorbide mononitrate (IMDUR) 30 MG 24 hr tablet TAKE ONE TABLET BY MOUTH ONCE DAILY 10/25/14  Yes Blane Ohara, MD  metFORMIN (GLUCOPHAGE) 500 MG tablet Take 1 tablet (500 mg total) by mouth 2 (two) times daily with a meal. HOLD today, restart on 11/08/2014. 11/07/14  Yes Rhonda G Barrett, PA-C  nitroGLYCERIN (NITROSTAT) 0.4 MG SL tablet Place 0.4 mg under the tongue every 5 (five) minutes as needed for chest pain.   Yes Historical Provider, MD  pravastatin (PRAVACHOL) 40 MG tablet Take 40 mg by mouth daily.   Yes Historical Provider, MD  vitamin B-12 (CYANOCOBALAMIN) 500 MCG tablet Take 1,000 mcg by mouth daily.    Yes Historical Provider, MD  metoprolol succinate (TOPROL-XL) 25 MG 24 hr tablet Take 25 mg by mouth daily. PT TAKES 1/2 TAB IN AM AND 1/2 PM    Historical Provider, MD   BP 118/66 mmHg  Pulse 53  Temp(Src) 98 F (36.7 C) (Oral)  Resp 19  SpO2 95% Physical Exam 1940: Physical examination:  Nursing notes reviewed; Vital signs and O2 SAT reviewed;  Constitutional: Well developed, Well nourished, Well hydrated, In no acute distress; Head:  Normocephalic, atraumatic; Eyes: EOMI, PERRL, No scleral icterus; ENMT: Mouth and pharynx normal, Mucous membranes moist; Neck: Supple, Full range of motion, No lymphadenopathy; Cardiovascular: Regular rate and rhythm, No gallop; Respiratory: Breath sounds coarse & equal bilaterally, No wheezes.  Speaking full sentences with ease, Normal respiratory effort/excursion; Chest: Nontender, Movement normal; Abdomen: Soft, Nontender, Nondistended, Normal bowel sounds; Genitourinary: No CVA tenderness; Extremities: Pulses normal, No tenderness, No edema, No calf edema or asymmetry.; Neuro: AA&Ox3, Major CN grossly intact.  Speech clear. No gross focal motor or sensory deficits in  extremities.; Skin: Color normal, Warm, Dry.     ED Course  Procedures     EKG Interpretation   Date/Time:  Tuesday November 19 2014 20:09:38 EST Ventricular Rate:  93 PR Interval:  188 QRS Duration: 140 QT Interval:  450 QTC Calculation: 560 R Axis:   -61 Text Interpretation:  Sinus rhythm Atrial premature complex Nonspecific  IVCD with LAD Anteroseptal infarct, old When compared with ECG of  11/06/2014 Nonspecific ST and T wave abnormality is no longer Present  Confirmed by [facility]  MD, Nunzio Cory [phone]) on 11/19/2014 9:33:00  PM      MDM  MDM Reviewed: previous chart, nursing note and vitals Reviewed previous: labs and ECG Interpretation: labs, ECG and x-ray      Results for orders placed or performed during the hospital encounter of 11/19/14  CBC with Differential  Result Value Ref Range   WBC 4.6 4.0 - 10.5 K/uL   RBC 3.24 (L) 4.22 - 5.81 MIL/uL   Hemoglobin 10.2 (L) 13.0 - 17.0 g/dL   HCT 31.1 (L) 39.0 - 52.0 %   MCV 96.0 78.0 - 100.0 fL   MCH 31.5 26.0 - 34.0 pg   MCHC 32.8 30.0 - 36.0 g/dL   RDW 15.6 (H) 11.5 - 15.5 %   Platelets 61 (L) 150 - 400 K/uL   Neutrophils Relative % 72 43 - 77 %   Neutro Abs 3.3 1.7 - 7.7 K/uL   Lymphocytes Relative 14 12 - 46 %   Lymphs Abs 0.6 (L) 0.7 - 4.0 K/uL   Monocytes Relative 13 (H) 3 - 12 %   Monocytes Absolute 0.6 0.1 - 1.0 K/uL   Eosinophils Relative 1 0 - 5 %   Eosinophils Absolute 0.1 0.0 - 0.7 K/uL   Basophils Relative 0 0 - 1 %   Basophils Absolute 0.0 0.0 - 0.1 K/uL  Basic metabolic panel  Result Value Ref Range   Sodium 136 (L) 137 - 147 mEq/L   Potassium 4.2 3.7 - 5.3 mEq/L   Chloride 98 96 - 112 mEq/L   CO2 24 19 - 32 mEq/L   Glucose, Bld 159 (H) 70 - 99 mg/dL   BUN 16 6 - 23 mg/dL   Creatinine, Ser 0.89 0.50 - 1.35 mg/dL   Calcium 9.3 8.4 - 10.5 mg/dL   GFR calc non Af Amer 76 (L) >90 mL/min   GFR calc Af Amer 88 (L) >90 mL/min   Anion gap 14 5 - 15  Pro b natriuretic peptide (BNP)  Result  Value Ref Range   Pro B Natriuretic peptide (BNP) 2133.0 (H) 0 - 450 pg/mL  Urinalysis, Routine w reflex microscopic  Result Value Ref Range   Color, Urine YELLOW YELLOW   APPearance CLEAR CLEAR   Specific Gravity, Urine 1.018 1.005 - 1.030   pH 6.0 5.0 - 8.0   Glucose, UA NEGATIVE NEGATIVE mg/dL   Hgb urine dipstick SMALL (A) NEGATIVE   Bilirubin Urine NEGATIVE NEGATIVE   Ketones, ur NEGATIVE NEGATIVE mg/dL   Protein, ur 100 (A) NEGATIVE mg/dL   Urobilinogen, UA 1.0 0.0 - 1.0 mg/dL   Nitrite NEGATIVE NEGATIVE   Leukocytes, UA NEGATIVE NEGATIVE  Urine microscopic-add on  Result Value Ref Range   Squamous Epithelial / LPF RARE RARE   WBC, UA 0-2 <3 WBC/hpf   RBC / HPF 0-2 <3 RBC/hpf   Bacteria, UA RARE RARE  I-Stat CG4 Lactic Acid, ED  Result Value Ref Range   Lactic Acid, Venous 1.41 0.5 - 2.2 mmol/L  I-Stat CG4 Lactic Acid, ED  Result Value Ref Range   Lactic Acid, Venous 0.68 0.5 - 2.2 mmol/L  I-stat troponin, ED  Result Value Ref Range   Troponin i, poc 0.06 0.00 - 0.08 ng/mL   Comment 3           Dg Chest 2 View 11/19/2014   CLINICAL DATA:  Weakness. Cough. Initial encounter. Fever and nausea.  EXAM: CHEST  2 VIEW  COMPARISON:  Fall/ 01/2014.  FINDINGS: Since the prior exam, the support apparatus has been removed.  Postoperative changes of median sternotomy and CABG. Cardiopericardial silhouette is mildly enlarged for projection. This is a chronic finding. There is pulmonary vascular congestion. Basilar atelectasis. No alveolar pulmonary edema. Trans catheter aortic valve replacement.  IMPRESSION: Cardiomegaly, basilar atelectasis and pulmonary vascular congestion. No pulmonary edema pure at postsurgical changes of the chest.   Electronically Signed   By: Dereck Ligas M.D.   On: 11/19/2014 21:19    2140:  BNP elevated from baseline and CXR with pulm vasc congestion. Flu swab obtained. BC and UC pending. T/C to CTS Dr. Darcey Nora, case discussed, including:  HPI, pertinent  PM/SHx, VS/PE, dx testing, ED course and treatment:  Valve infection can still be on DDX, he requests to call Cards MD. T/C to Cards Fellow, case discussed, including:  HPI, pertinent PM/SHx, VS/PE, dx testing, ED course and treatment:  Agreeable to come to ED for evaluation.    2355:  Cards MD has evaluated pt in the ED: requests to admit to medicine service, they will follow in consult. T/C to [facility] Dr. Brigitte Pulse, case discussed, including:  HPI, pertinent PM/SHx, VS/PE, dx testing, ED course and treatment:  Agreeable to admit, requests he will come to the ED for evaluation for admission.    Francine Graven, DO 11/22/14 1358

## 2014-11-20 DIAGNOSIS — J45909 Unspecified asthma, uncomplicated: Secondary | ICD-10-CM | POA: Diagnosis present

## 2014-11-20 DIAGNOSIS — Z955 Presence of coronary angioplasty implant and graft: Secondary | ICD-10-CM | POA: Diagnosis not present

## 2014-11-20 DIAGNOSIS — D61818 Other pancytopenia: Secondary | ICD-10-CM

## 2014-11-20 DIAGNOSIS — N400 Enlarged prostate without lower urinary tract symptoms: Secondary | ICD-10-CM | POA: Diagnosis present

## 2014-11-20 DIAGNOSIS — E785 Hyperlipidemia, unspecified: Secondary | ICD-10-CM | POA: Diagnosis present

## 2014-11-20 DIAGNOSIS — Z954 Presence of other heart-valve replacement: Secondary | ICD-10-CM

## 2014-11-20 DIAGNOSIS — Z9842 Cataract extraction status, left eye: Secondary | ICD-10-CM | POA: Diagnosis not present

## 2014-11-20 DIAGNOSIS — I739 Peripheral vascular disease, unspecified: Secondary | ICD-10-CM | POA: Diagnosis present

## 2014-11-20 DIAGNOSIS — I4891 Unspecified atrial fibrillation: Secondary | ICD-10-CM | POA: Diagnosis present

## 2014-11-20 DIAGNOSIS — F17220 Nicotine dependence, chewing tobacco, uncomplicated: Secondary | ICD-10-CM | POA: Diagnosis present

## 2014-11-20 DIAGNOSIS — Z881 Allergy status to other antibiotic agents status: Secondary | ICD-10-CM | POA: Diagnosis not present

## 2014-11-20 DIAGNOSIS — D759 Disease of blood and blood-forming organs, unspecified: Secondary | ICD-10-CM | POA: Diagnosis present

## 2014-11-20 DIAGNOSIS — E119 Type 2 diabetes mellitus without complications: Secondary | ICD-10-CM | POA: Diagnosis present

## 2014-11-20 DIAGNOSIS — I38 Endocarditis, valve unspecified: Secondary | ICD-10-CM | POA: Diagnosis present

## 2014-11-20 DIAGNOSIS — I252 Old myocardial infarction: Secondary | ICD-10-CM | POA: Diagnosis not present

## 2014-11-20 DIAGNOSIS — Z8572 Personal history of non-Hodgkin lymphomas: Secondary | ICD-10-CM | POA: Diagnosis not present

## 2014-11-20 DIAGNOSIS — Z952 Presence of prosthetic heart valve: Secondary | ICD-10-CM | POA: Diagnosis not present

## 2014-11-20 DIAGNOSIS — Z888 Allergy status to other drugs, medicaments and biological substances status: Secondary | ICD-10-CM | POA: Diagnosis not present

## 2014-11-20 DIAGNOSIS — I2510 Atherosclerotic heart disease of native coronary artery without angina pectoris: Secondary | ICD-10-CM | POA: Diagnosis present

## 2014-11-20 DIAGNOSIS — M1990 Unspecified osteoarthritis, unspecified site: Secondary | ICD-10-CM | POA: Diagnosis present

## 2014-11-20 DIAGNOSIS — Z89022 Acquired absence of left finger(s): Secondary | ICD-10-CM | POA: Diagnosis not present

## 2014-11-20 DIAGNOSIS — B349 Viral infection, unspecified: Secondary | ICD-10-CM | POA: Diagnosis present

## 2014-11-20 DIAGNOSIS — Z8701 Personal history of pneumonia (recurrent): Secondary | ICD-10-CM | POA: Diagnosis not present

## 2014-11-20 DIAGNOSIS — A499 Bacterial infection, unspecified: Secondary | ICD-10-CM | POA: Diagnosis present

## 2014-11-20 DIAGNOSIS — Z7982 Long term (current) use of aspirin: Secondary | ICD-10-CM | POA: Diagnosis not present

## 2014-11-20 DIAGNOSIS — I10 Essential (primary) hypertension: Secondary | ICD-10-CM | POA: Diagnosis present

## 2014-11-20 DIAGNOSIS — R509 Fever, unspecified: Secondary | ICD-10-CM

## 2014-11-20 DIAGNOSIS — Z951 Presence of aortocoronary bypass graft: Secondary | ICD-10-CM | POA: Diagnosis not present

## 2014-11-20 DIAGNOSIS — I517 Cardiomegaly: Secondary | ICD-10-CM

## 2014-11-20 DIAGNOSIS — E1159 Type 2 diabetes mellitus with other circulatory complications: Secondary | ICD-10-CM | POA: Diagnosis present

## 2014-11-20 DIAGNOSIS — J449 Chronic obstructive pulmonary disease, unspecified: Secondary | ICD-10-CM | POA: Diagnosis present

## 2014-11-20 DIAGNOSIS — Z91041 Radiographic dye allergy status: Secondary | ICD-10-CM | POA: Diagnosis not present

## 2014-11-20 DIAGNOSIS — Z9841 Cataract extraction status, right eye: Secondary | ICD-10-CM | POA: Diagnosis not present

## 2014-11-20 LAB — HEPATIC FUNCTION PANEL
ALT: 17 U/L (ref 0–53)
AST: 24 U/L (ref 0–37)
Albumin: 3.9 g/dL (ref 3.5–5.2)
Alkaline Phosphatase: 126 U/L — ABNORMAL HIGH (ref 39–117)
Bilirubin, Direct: 0.2 mg/dL (ref 0.0–0.3)
Indirect Bilirubin: 0.8 mg/dL (ref 0.3–0.9)
Total Bilirubin: 1 mg/dL (ref 0.3–1.2)
Total Protein: 5.8 g/dL — ABNORMAL LOW (ref 6.0–8.3)

## 2014-11-20 LAB — CBC WITH DIFFERENTIAL/PLATELET
BASOS PCT: 0 % (ref 0–1)
Basophils Absolute: 0 10*3/uL (ref 0.0–0.1)
Eosinophils Absolute: 0.1 10*3/uL (ref 0.0–0.7)
Eosinophils Relative: 3 % (ref 0–5)
HCT: 29.7 % — ABNORMAL LOW (ref 39.0–52.0)
HEMOGLOBIN: 9.8 g/dL — AB (ref 13.0–17.0)
Lymphocytes Relative: 18 % (ref 12–46)
Lymphs Abs: 0.5 10*3/uL — ABNORMAL LOW (ref 0.7–4.0)
MCH: 31.9 pg (ref 26.0–34.0)
MCHC: 33 g/dL (ref 30.0–36.0)
MCV: 96.7 fL (ref 78.0–100.0)
Monocytes Absolute: 0.5 10*3/uL (ref 0.1–1.0)
Monocytes Relative: 16 % — ABNORMAL HIGH (ref 3–12)
NEUTROS PCT: 63 % (ref 43–77)
Neutro Abs: 1.8 10*3/uL (ref 1.7–7.7)
PLATELETS: 53 10*3/uL — AB (ref 150–400)
RBC: 3.07 MIL/uL — ABNORMAL LOW (ref 4.22–5.81)
RDW: 15.8 % — ABNORMAL HIGH (ref 11.5–15.5)
WBC: 2.8 10*3/uL — ABNORMAL LOW (ref 4.0–10.5)

## 2014-11-20 LAB — INFLUENZA PANEL BY PCR (TYPE A & B)
H1N1FLUPCR: NOT DETECTED
Influenza A By PCR: NEGATIVE
Influenza B By PCR: NEGATIVE

## 2014-11-20 LAB — BASIC METABOLIC PANEL
ANION GAP: 14 (ref 5–15)
BUN: 14 mg/dL (ref 6–23)
CHLORIDE: 106 meq/L (ref 96–112)
CO2: 22 mEq/L (ref 19–32)
Calcium: 9 mg/dL (ref 8.4–10.5)
Creatinine, Ser: 0.88 mg/dL (ref 0.50–1.35)
GFR, EST AFRICAN AMERICAN: 88 mL/min — AB (ref >90)
GFR, EST NON AFRICAN AMERICAN: 76 mL/min — AB (ref >90)
Glucose, Bld: 141 mg/dL — ABNORMAL HIGH (ref 70–99)
POTASSIUM: 4 meq/L (ref 3.7–5.3)
Sodium: 142 mEq/L (ref 137–147)

## 2014-11-20 LAB — GLUCOSE, CAPILLARY
GLUCOSE-CAPILLARY: 117 mg/dL — AB (ref 70–99)
GLUCOSE-CAPILLARY: 125 mg/dL — AB (ref 70–99)
Glucose-Capillary: 129 mg/dL — ABNORMAL HIGH (ref 70–99)
Glucose-Capillary: 106 mg/dL — ABNORMAL HIGH (ref 70–99)

## 2014-11-20 MED ORDER — LEVOFLOXACIN IN D5W 750 MG/150ML IV SOLN
750.0000 mg | INTRAVENOUS | Status: DC
  Administered 2014-11-20 – 2014-11-22 (×3): 750 mg via INTRAVENOUS
  Filled 2014-11-20 (×4): qty 150

## 2014-11-20 MED ORDER — ASPIRIN EC 81 MG PO TBEC
81.0000 mg | DELAYED_RELEASE_TABLET | ORAL | Status: DC
  Administered 2014-11-20 – 2014-11-22 (×3): 81 mg via ORAL
  Filled 2014-11-20 (×4): qty 1

## 2014-11-20 MED ORDER — SODIUM CHLORIDE 0.9 % IJ SOLN
3.0000 mL | Freq: Two times a day (BID) | INTRAMUSCULAR | Status: DC
  Administered 2014-11-20: 3 mL via INTRAVENOUS

## 2014-11-20 MED ORDER — ENOXAPARIN SODIUM 40 MG/0.4ML ~~LOC~~ SOLN
40.0000 mg | SUBCUTANEOUS | Status: DC
  Administered 2014-11-20 – 2014-11-21 (×2): 40 mg via SUBCUTANEOUS
  Filled 2014-11-20 (×3): qty 0.4

## 2014-11-20 MED ORDER — POLYETHYLENE GLYCOL 3350 17 G PO PACK
17.0000 g | PACK | Freq: Every day | ORAL | Status: DC | PRN
  Filled 2014-11-20: qty 1

## 2014-11-20 MED ORDER — INSULIN ASPART 100 UNIT/ML ~~LOC~~ SOLN
0.0000 [IU] | Freq: Three times a day (TID) | SUBCUTANEOUS | Status: DC
  Administered 2014-11-20 – 2014-11-22 (×4): 2 [IU] via SUBCUTANEOUS

## 2014-11-20 MED ORDER — VANCOMYCIN HCL 10 G IV SOLR
1500.0000 mg | Freq: Once | INTRAVENOUS | Status: AC
  Administered 2014-11-20: 1500 mg via INTRAVENOUS
  Filled 2014-11-20: qty 1500

## 2014-11-20 MED ORDER — ONDANSETRON HCL 4 MG/2ML IJ SOLN: 4.0000 mg | Freq: Four times a day (QID) | INTRAMUSCULAR | Status: DC | PRN

## 2014-11-20 MED ORDER — ISOSORBIDE MONONITRATE ER 30 MG PO TB24
30.0000 mg | ORAL_TABLET | Freq: Every day | ORAL | Status: DC
  Administered 2014-11-20 – 2014-11-22 (×3): 30 mg via ORAL
  Filled 2014-11-20 (×3): qty 1

## 2014-11-20 MED ORDER — OSELTAMIVIR PHOSPHATE 75 MG PO CAPS
75.0000 mg | ORAL_CAPSULE | Freq: Two times a day (BID) | ORAL | Status: DC
  Administered 2014-11-20: 75 mg via ORAL
  Filled 2014-11-20 (×3): qty 1

## 2014-11-20 MED ORDER — METFORMIN HCL 500 MG PO TABS
500.0000 mg | ORAL_TABLET | Freq: Two times a day (BID) | ORAL | Status: DC
  Administered 2014-11-20 – 2014-11-22 (×5): 500 mg via ORAL
  Filled 2014-11-20 (×7): qty 1

## 2014-11-20 MED ORDER — ACETAMINOPHEN 325 MG PO TABS
650.0000 mg | ORAL_TABLET | Freq: Four times a day (QID) | ORAL | Status: DC | PRN
Start: 2014-11-20 — End: 2014-11-22

## 2014-11-20 MED ORDER — ACETAMINOPHEN 650 MG RE SUPP: 650.0000 mg | Freq: Four times a day (QID) | RECTAL | Status: DC | PRN

## 2014-11-20 MED ORDER — ALBUTEROL SULFATE (2.5 MG/3ML) 0.083% IN NEBU: 2.5000 mg | INHALATION_SOLUTION | RESPIRATORY_TRACT | Status: DC | PRN

## 2014-11-20 MED ORDER — ONDANSETRON HCL 4 MG PO TABS: 4.0000 mg | ORAL_TABLET | Freq: Four times a day (QID) | ORAL | Status: DC | PRN

## 2014-11-20 MED ORDER — PRAVASTATIN SODIUM 40 MG PO TABS
40.0000 mg | ORAL_TABLET | Freq: Every day | ORAL | Status: DC
  Administered 2014-11-20 – 2014-11-22 (×3): 40 mg via ORAL
  Filled 2014-11-20 (×3): qty 1

## 2014-11-20 MED ORDER — ALUM & MAG HYDROXIDE-SIMETH 200-200-20 MG/5ML PO SUSP: 30.0000 mL | Freq: Four times a day (QID) | ORAL | Status: DC | PRN

## 2014-11-20 MED ORDER — CLOPIDOGREL BISULFATE 75 MG PO TABS
75.0000 mg | ORAL_TABLET | Freq: Every day | ORAL | Status: DC
  Administered 2014-11-20 – 2014-11-22 (×3): 75 mg via ORAL
  Filled 2014-11-20 (×3): qty 1

## 2014-11-20 MED ORDER — CYANOCOBALAMIN 500 MCG PO TABS
1000.0000 ug | ORAL_TABLET | Freq: Every day | ORAL | Status: DC
Start: 2014-11-20 — End: 2014-11-22
  Administered 2014-11-20 – 2014-11-22 (×3): 1000 ug via ORAL
  Filled 2014-11-20 (×3): qty 2

## 2014-11-20 MED ORDER — VANCOMYCIN HCL 10 G IV SOLR
1250.0000 mg | INTRAVENOUS | Status: DC
  Administered 2014-11-21 – 2014-11-22 (×2): 1250 mg via INTRAVENOUS
  Filled 2014-11-20 (×2): qty 1250

## 2014-11-20 MED ORDER — NITROGLYCERIN 0.4 MG SL SUBL
0.4000 mg | SUBLINGUAL_TABLET | SUBLINGUAL | Status: DC | PRN
Start: 2014-11-20 — End: 2014-11-22

## 2014-11-20 MED ORDER — SODIUM CHLORIDE 0.9 % IV SOLN
INTRAVENOUS | Status: DC
  Administered 2014-11-20 (×2): via INTRAVENOUS

## 2014-11-20 MED ORDER — GABAPENTIN 300 MG PO CAPS
300.0000 mg | ORAL_CAPSULE | Freq: Three times a day (TID) | ORAL | Status: DC
  Administered 2014-11-20 – 2014-11-21 (×6): 300 mg via ORAL
  Administered 2014-11-22: 600 mg via ORAL
  Filled 2014-11-20 (×9): qty 2

## 2014-11-20 NOTE — Consult Note (Signed)
CARDIOLOGY CONSULT NOTE  Patient ID: [name], MRN: [id number], DOB/AGE: 1927/08/07 80 y.o. Admit date: 11/19/2014 Date of Consult: 11/20/2014  Primary Physician: Donnajean Lopes, MD Primary Cardiologist: Dr. Burt Knack  Chief Complaint: fevers Reason for Consultation: recent cardiac procedure  HPI: 80 y.o. male w/ PMHx significant for CAD s/p CABG and recent PCI, AS s/p TAVR 11/06/2014, h/o lymphoma s/p rituxan who presented to [facility] on 11/20/2014 with complaints of fevers. Noted recent history of undergoing staged PCI then TAVR 12/2 for CAD and aortic stenosis. Was discharged and had followup a week later with [name] and reportedly was doing well at that time. Reports feeling at baseline yesterday and even this morning was feeling okay. However, later in the day, felt feverish and unwell. Measured temp at home of 102+. In the ER was also elevated. Not really localizing- has a cough though this was only brought out with repeat questioning. Minimal clear production. No diarrhea or constipation. Groin is mildly tender but is improving. No urinary symptoms. No other sick contacts. Had flu shot. Denies LE edema, PND, orthopnea, chest pain.  Has irritation behind R ear that has been there before, more irritated currently.  Labs show WBC of 4.6, Plts 61, Hct 31. BNP of 2133. U/A without infection  Cxray w/ mild pulm vasc congestion.   Past Medical History  Diagnosis Date  . CAD (coronary artery disease)     a. s/p CABG 1995;  b. NSTEMI & subsequent BMS to SVG->right PDA 02/03/12.;  c. Cath 02/14/2012 3vd with 4/4 patent grafts and patent stent in vg->pda;  d. 10/2014 PCI/DES to the VG->PDA.  . Carotid stenosis     a. Carotid dopplers 1/06: RICA 26-94%, LICA 8-54%;  b. dopplers 3/14:  62-70% RICA, 3-50% LICA  . HTN (hypertension)   . HLD (hyperlipidemia)   . Asthmatic bronchitis   . BPH (benign prostatic hypertrophy)   . Herpes zoster   . Anemia   . Thrombocytopenia      a. secondary to splenomegaly related to lymphoma with suspicion of bone marrow involvement. (Plavix had to be stopped due to this)  . Aortic stenosis, severe     a. ECHO 07/22/14 Severe AS. Peak and mean gradients of 57 mmHg and 42 mmHg, respectively. Calculated AVA is 0.8-0.9 cm2. Trivial AR. Valve area (VTI): 0.84 cm^2. Valve area (Vmax): 0.86 cm^2.. Aortic root dimension: 42 mm (ED) and aortic root mildly dilated;  b. 10/2014 S/P TAVR w/ Edwards Sapien 3 THV 26mm;  c. 10/2014 Echo: EF 50%, nl fxning AoV, mean grad of 9, peak of 16.  Marland Kitchen Splenomegaly   . COPD (chronic obstructive pulmonary disease)   . Peripheral vascular disease     a. s/p L ray amp 10/13;  b. s/p L 2nd toe amp 12/13  . Pulmonary nodule     a. 69mm RUL calcified pulm nodule noted on CT staging for lymphoma - stable 10/2012.  . Leg DVT (deep venous thromboembolism), chronic     a. LE dopplers 5/13, 10/13 and 1/14: chronic DVT involving right mid femoral vein, left mid femoral vein, and left popliteal vein.  . Osteomyelitis     a. L first foot ray amputation 09/2012  . Heart murmur   . Anginal pain   . Myocardial infarction ?1995  . Pneumonia ?2014  . DM2 (diabetes mellitus, type 2)   . DJD (degenerative joint disease)   . 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 Stage  III s/p bendamustine, Rituxan therapy  . 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 29 mm Edwards Sapien 3 transcatheter heart valve placed via open right transfemoral approach      Surgical History:  Past Surgical History  Procedure Laterality Date  . Coronary artery bypass graft  1995  . Shoulder arthroscopy w/ rotator cuff repair Right   . Carpal tunnel release Bilateral   . Incisional hernia repair    . Kidney surgery      "cut me 1/2 in 2 for stones"  . Back surgery    . I&d extremity  09/16/2012    Procedure: IRRIGATION AND DEBRIDEMENT EXTREMITY;  Surgeon: Wylene Simmer, MD;  Location: [facility];  Service:  Orthopedics;  Laterality: Left;  irrigation and debridement and first Ray amputation of left foot  . Amputation  09/16/2012    Procedure: AMPUTATION RAY;  Surgeon: Wylene Simmer, MD;  Location: [facility];  Service: Orthopedics;  Laterality: Left;  first Ray amputation left foot  . Amputation  11/21/2012    Procedure: AMPUTATION RAY;  Surgeon: Wylene Simmer, MD;  Location: [place];  Service: Orthopedics;  Laterality: Left;  LEFT SECOND TOE AMPUTATION  . I&d extremity Left 03/01/2013    Procedure: IRRIGATION AND DEBRIDEMENT EXTREMITY;  Surgeon: Wylene Simmer, MD;  Location: [facility];  Service: Orthopedics;  Laterality: Left;  IRRIGATION  AND  DEBRIDEMENT  LEFT  FOOT  . Amputation Left 12/20/2013    Procedure: AMPUTATION LEFT 5TH TRANSMETATARSAL;  Surgeon: Wylene Simmer, MD;  Location: [place];  Service: Orthopedics;  Laterality: Left;  . Achilles tendon lengthening Left 12/20/2013    Procedure: ACHILLES TENDON LENGTHENING;  Surgeon: Wylene Simmer, MD;  Location: [place];  Service: Orthopedics;  Laterality: Left;  . Hernia repair    . Coronary angioplasty with stent placement  09/2014; 10/14/2014    "?2; 1"  . Cataract extraction w/ intraocular lens  implant, bilateral Bilateral   . Transcatheter aortic valve replacement, transfemoral N/A 11/05/2014    Procedure: TRANSCATHETER AORTIC VALVE REPLACEMENT, TRANSFEMORAL;  Surgeon: [name], MD;  Location: [facility] OR; Berniece Pap 3 THV (size 29 mm, model # F048547, serial # [id number])   . Intraoperative transesophageal echocardiogram N/A 11/05/2014    Procedure: INTRAOPERATIVE TRANSESOPHAGEAL ECHOCARDIOGRAM;  Surgeon: [name], MD;  Location: [facility] OR;  Service: Open Heart Surgery;  Laterality: N/A;  . Left and right heart catheterization with coronary/graft angiogram N/A 02/03/2012    Procedure: LEFT AND RIGHT HEART CATHETERIZATION WITH [name];  Surgeon: [name], MD;  Location: [facility] CATH LAB;  Service: Cardiovascular;  Laterality: N/A;  . Percutaneous  stent intervention  02/03/2012    Procedure: PERCUTANEOUS STENT INTERVENTION;  Surgeon: [name], MD;  Location: [facility] CATH LAB;  Service: Cardiovascular;;  . Left heart catheterization with coronary/graft angiogram N/A 02/14/2012    Procedure: LEFT HEART CATHETERIZATION WITH [name];  Surgeon: Peter M Martinique, MD;  Location: [facility] CATH LAB;  Service: Cardiovascular;  Laterality: N/A;  . Left and right heart catheterization with coronary/graft angiogram N/A 09/23/2014    Procedure: LEFT AND RIGHT HEART CATHETERIZATION WITH [name];  Surgeon: Blane Ohara, MD;  Location: [facility] CATH LAB;  Service: Cardiovascular;  Laterality: N/A;  . Percutaneous coronary stent intervention (pci-s)  10/14/2014    Procedure: PERCUTANEOUS CORONARY STENT INTERVENTION (PCI-S);  Surgeon: Blane Ohara, MD;  Location: [facility] CATH LAB;  Service: Cardiovascular;;     Home Meds: Prior to Admission medications   Medication Sig Start Date  End Date Taking? Authorizing Provider  aspirin EC 81 MG tablet Take 81 mg by mouth every morning.    Yes Historical Provider, MD  clopidogrel (PLAVIX) 75 MG tablet Take 1 tablet (75 mg total) by mouth daily. 10/15/14  Yes Almyra Deforest, PA  gabapentin (NEURONTIN) 300 MG capsule Take 300-600 mg by mouth 3 (three) times daily. Take 600 mg in the morning, 600 mg at midday, and 300 mg at bedtime. 03/03/13  Yes Haywood Pao, MD  isosorbide mononitrate (IMDUR) 30 MG 24 hr tablet TAKE ONE TABLET BY MOUTH ONCE DAILY 10/25/14  Yes Blane Ohara, MD  metFORMIN (GLUCOPHAGE) 500 MG tablet Take 1 tablet (500 mg total) by mouth 2 (two) times daily with a meal. HOLD today, restart on 11/08/2014. 11/07/14  Yes Rhonda G Barrett, PA-C  nitroGLYCERIN (NITROSTAT) 0.4 MG SL tablet Place 0.4 mg under the tongue every 5 (five) minutes as needed for chest pain.   Yes Historical Provider, MD  pravastatin (PRAVACHOL) 40 MG tablet Take 40 mg by mouth daily.   Yes Historical Provider, MD   vitamin B-12 (CYANOCOBALAMIN) 500 MCG tablet Take 1,000 mcg by mouth daily.    Yes Historical Provider, MD   Inpatient Medications:    Allergies:  Allergies  Allergen Reactions  . Losartan Other (See Comments)    Hyperkalemia > 7  . Azithromycin Itching  . Contrast Media [Iodinated Diagnostic Agents] Rash  . Doxycycline Itching       . Keflex [Cephalexin] Itching  . Macrodantin Itching  . Minocycline Hcl Itching  . Nitrofurantoin Itching  . Zithromax [Azithromycin Dihydrate] Itching    History   Social History  . Marital Status: Married    Spouse Name: N/A    Number of Children: N/A  . Years of Education: N/A   Occupational History  . retired [employer]   Social History Main Topics  . Smoking status: Former Smoker -- 1.00 packs/day for 30 years    Types: Cigarettes, Pipe    Quit date: 03/29/1985  . Smokeless tobacco: Former Systems developer    Types: Chew     Comment: "chewed for ~ 1 yr after I quit smoking"  . Alcohol Use: No  . Drug Use: No  . Sexual Activity: Not Currently   Other Topics Concern  . Not on file   Social History Narrative     Family History  Problem Relation Age of Onset  . Coronary artery disease    . Alcohol abuse       Review of Systems: General: +fevers Cardiovascular: see HPI Dermatological: +ear irritation Respiratory: +min prod cough Urologic: negative for hematuria Abdominal: negative for nausea, vomiting, diarrhea, bright red blood per rectum, melena, or hematemesis Neurologic: negative for visual changes, syncope, or dizziness All other systems reviewed and are otherwise negative except as noted above.  Labs: No results for input(s): CKTOTAL, CKMB, TROPONINI in the last 72 hours. Lab Results  Component Value Date   WBC 4.6 11/19/2014   HGB 10.2* 11/19/2014   HCT 31.1* 11/19/2014   MCV 96.0 11/19/2014   PLT 61* 11/19/2014    Recent Labs Lab 11/19/14 1744  NA 136*  K 4.2  CL 98  CO2 24  BUN 16  CREATININE 0.89   CALCIUM 9.3  GLUCOSE 159*   Lab Results  Component Value Date   CHOL 111 02/02/2012   HDL 24* 02/02/2012   LDLCALC 72 02/02/2012   TRIG 76 02/02/2012   No results found for: DDIMER  Radiology/Studies:  Dg Chest 2 View  11/19/2014   CLINICAL DATA:  Weakness. Cough. Initial encounter. Fever and nausea.  EXAM: CHEST  2 VIEW  COMPARISON:  Fall/ 01/2014.  FINDINGS: Since the prior exam, the support apparatus has been removed. Postoperative changes of median sternotomy and CABG. Cardiopericardial silhouette is mildly enlarged for projection. This is a chronic finding. There is pulmonary vascular congestion. Basilar atelectasis. No alveolar pulmonary edema. Trans catheter aortic valve replacement.  IMPRESSION: Cardiomegaly, basilar atelectasis and pulmonary vascular congestion. No pulmonary edema pure at postsurgical changes of the chest.   Electronically Signed   By: Dereck Ligas M.D.   On: 11/19/2014 21:19   EKG: sinus with freq PACs, antsept q waves, infer ST changes resolved  Physical Exam: Blood pressure 130/94, pulse 89, temperature 98 F (36.7 C), temperature source Oral, resp. rate 22, SpO2 97 %. General: Well developed, well nourished, in no acute distress. Head: Normocephalic, atraumatic, sclera non-icteric, no xanthomas, nares are without discharge.  Neck: Supple. . JVD not elevated. Lungs: Clear bilaterally to auscultation without wheezes, rales, or rhonchi. Breathing is unlabored. Heart: RRR with S1 S2. No murmurs, rubs, or gallops appreciated. Abdomen: Soft, non-tender, non-distended with normoactive bowel sounds. No hepatomegaly. No rebound/guarding. No obvious abdominal masses. Msk:  Strength and tone appear normal for age. Extremities: No clubbing or cyanosis. trace edema.  Left toes missing. Groin site on R  Is mildly tender but c/d/i without erythema. No stigmata in nails or extremities Neuro: Alert and oriented X 3. Moves all extremities spontaneously. Psych:  Responds  to questions appropriately with a normal affect.    1. Fever of 102+, without localization 2. Recent hospitalization 2 weeks ago 3. S/p recent PCI with DES and TAVR 12/2 4. CAD 5. H/o lymphoma, last rituxan txmt ~1 yr ago 10. H/o post op afib, currently in sinus 7. Chronic thrombocytopenia, due to #5  Assessment and Plan:  80 y.o. male w/ PMHx significant for CAD s/p CABG and recent PCI, AS s/p TAVR 11/06/2014, h/o lymphoma s/p rituxan who presented to [facility] on 11/20/2014 with complaints of fevers, measured to 102+. History though is not localizing.  Thus far, workup for urinary or pulmonary source also unrevealing. Other potential sources include incision site- clean. No other stigmata of cardiac origin of fever and this remains low on the differential. Blood cultures pending, flu swab pending. WBC not elevated though has some relative pancytopenia due to lymphoma history. At this time, would be helpful to get our internal medicine/hospitalist collegues to assist with fever workup and empiric treatment (HAP?). Will consider echo to evaluate valve during hospitalization.  In regards to his recent valve and PCI, need to continue aspirin and plavix. Thrombocytopenia stable. On statin and long acting nitrate. No longer on BB due to history of bradycardia. H/o afib peri-op, currently in sinus.  Appreciate assistance from internal medicine team. Will work alongside. Please call with questions.  Signed, Yovanny Coats C. MD 11/20/2014, 12:03 AM

## 2014-11-20 NOTE — ED Notes (Signed)
Patient Contact Information: [name] (wife): [phone]

## 2014-11-20 NOTE — Progress Notes (Addendum)
Patient refusing bed alarm this evening shift.  Re-educated patient on importance of utilizing bed alarm; patient still refused.  Non-skid socks on.  Bed in lowest position.  Emphasized use of call bell if needed to use the bathroom; verbalized understanding.  Also stated that he was too tired to answer admission questions this evening.  Will continue to monitor patient.

## 2014-11-20 NOTE — Progress Notes (Signed)
80 y.o. male w/ PMHx significant for CAD s/p CABG and recent PCI, AS s/p TAVR 11/06/2014, h/o lymphoma s/p rituxan who presented to [facility] on 11/20/2014 with complaints of fevers, measured to 102+. History though is not localizing.  Subjective:  Has not had any more fevers since arrival. Did note some mild nausea but no vomiting when he had his fever yesterday. Has not had any significant coughing either productive or nonproductive. He does not recall any wounds that have been poorly healing. He does have a scrape on the back of his right ear but has not noticed any pus. Tenderness or pain in the groin area at his access sites.  Objective:  Vital Signs in the last 24 hours: Temp:  [97.8 F (36.6 C)-102 F (38.9 C)] 97.8 F (36.6 C) (12/16 1007) Pulse Rate:  [29-120] 75 (12/16 1007) Resp:  [18-25] 19 (12/16 1007) BP: (114-154)/(42-113) 154/74 mmHg (12/16 1007) SpO2:  [94 %-99 %] 96 % (12/16 1007) Weight:  [185 lb 3 oz (84 kg)] 185 lb 3 oz (84 kg) (12/16 0128)  Intake/Output from previous day: 12/15 0701 - 12/16 0700 In: 353.3 [I.V.:203.3; IV Piggyback:150] Out: 700 [Urine:700] Intake/Output from this shift: Total I/O In: -  Out: 550 [Urine:550]  Physical Exam: General appearance: alert, cooperative, appears stated age and no distress Neck: no adenopathy, no carotid bruit and no JVD Lungs: Normal worker breathing. He does have diffuse mild interstitial sounds with some crackles but no rhonchi or rales. Heart: regular rate and rhythm, S1, S2 normal, no murmur, click, rub or gallop and normal apical impulse Abdomen: soft, non-tender; bowel sounds normal; no masses,  no organomegaly Extremities: extremities normal, atraumatic, no cyanosis or edema and Right groin femoral cutdown site appears to be healing well with no signs of induration or erythema and nontender. Left groin access site mild ecchymosis but no tenderness or erythema. Pulses: 2+ and symmetric Neurologic:  Grossly normal  Lab Results:  Recent Labs  11/19/14 1744 11/20/14 0544  WBC 4.6 2.8*  HGB 10.2* 9.8*  PLT 61* 53*    Recent Labs  11/19/14 1744 11/20/14 0544  NA 136* 142  K 4.2 4.0  CL 98 106  CO2 24 22  GLUCOSE 159* 141*  BUN 16 14  CREATININE 0.89 0.88   No results for input(s): TROPONINI in the last 72 hours.  Invalid input(s): CK, MB Hepatic Function Panel  Recent Labs  11/19/14 1936  PROT 5.8*  ALBUMIN 3.9  AST 24  ALT 17  ALKPHOS 126*  BILITOT 1.0  BILIDIR 0.2  IBILI 0.8   No results for input(s): CHOL in the last 72 hours. No results for input(s): PROTIME in the last 72 hours.  Imaging:  Chest x-ray 12/15: Cardiomegaly with bibasilar atelectasis and mild pulmonary vascular congestion. No edema.  Cardiac Studies: Echocardiogram just now performed with results pending  Assessment/Plan:  Principal Problem:   Fever Active Problems:   Mantle cell lymphoma   Other pancytopenia   S/P TAVR (transcatheter aortic valve replacement)   Type 2 diabetes mellitus with vascular disease  80 year old gentleman with [place]ated medical history presenting with sudden onset fever yesterday. He does have a workup pending with blood cultures and flu swab etc. No significant white blood count elevation but he does have pancytopenia as noted above. His thrombus cytopenia is stable and he is remains on aspirin and Plavix.  He does not have any signs of procedure related infection on exam. I don't see any poorly  healing wounds.  We will follow-up the results of the echocardiogram to ensure there is no evidence of endocarditis. He does not have any stigmata which would not be expected at this time anyway.  I have notified the valve clinic team including Dr. Burt Knack. We will monitor along and see as needed. Pending the results the echocardiogram, I don't know that there is much active cardiac issues for [place] to follow. Do not hesitate to contact us if you have questions.     LOS: 1 day    HARDING,DAVID W 11/20/2014, 10:59 AM

## 2014-11-20 NOTE — Progress Notes (Signed)
Echocardiogram 2D Echocardiogram has been performed.  Joelene Millin 11/20/2014, 10:42 AM

## 2014-11-20 NOTE — H&P (Signed)
PCP:   Richard Lopes, MD   Chief Complaint:  Fever  HPI: 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 Patient states that he has been doing well since his recent TAVR with no complications. This afternoon he developed sudden onset fever, chills, and mild malaise. He states that he felt flulike. He reports that his wife called our office and since Dr. Philip [name] was not in, she called the cardiologist office who recommended that he come to the emergency department for evaluation due to his recent TAVR.  In the emergency department he is found to have a temperature of 102, normal blood pressure, heart rate, and white blood count of 4.6. Chest x-ray shows no acute cardiopulmonary disease. Given fever and recent aortic valve procedure he is being admitted for further management and evaluation to rule out endocarditis. He denies any other focal symptoms or potential source of fever-specifically denies sinus pain, congestion, nausea/vomiting/diarrhea, dysuria or flank pain. Incision in the right groin has healed without any erythema or drainage. He does have a mild cough which is nonproductive and chronic. No ulcerations or skin rash.  He reports that he has completed his chemotherapy for his lymphoma with no evidence of disease. He has not seen oncology since 12/14. CT in 11/15 showed findings compatible with cirrhosis with stigmata of portal hypertension including mild portal vein dilatation and slow megaly but no evidence of lymphoma.  Review of Systems:  Review of Systems - All systems reviewed and are negative as in history of present illness. Past  Medical History: Past Medical History  Diagnosis Date  . CAD (coronary artery disease)     a. s/p CABG 1995;  b. NSTEMI & subsequent BMS to SVG->right PDA 02/03/12.;  c. Cath 02/14/2012 3vd with 4/4 patent grafts and patent stent in vg->pda;  d. 10/2014 PCI/DES to the VG->PDA.  . Carotid stenosis     a. Carotid dopplers 3/14: RICA 97-02%, LICA 6-37%;  b. dopplers 3/14:  85-88% RICA, 5-02% LICA  . HTN (hypertension)   . HLD (hyperlipidemia)   . Asthmatic bronchitis   . BPH (benign prostatic hypertrophy)   . Herpes zoster   . Anemia   . Thrombocytopenia     a. secondary to splenomegaly related to lymphoma with suspicion of bone marrow involvement. (Plavix had to be stopped due to this)  . Aortic stenosis, severe     a. ECHO 07/22/14 Severe AS. Peak and mean gradients of 57 mmHg and 42 mmHg, respectively. Calculated AVA is 0.8-0.9 cm2. Trivial AR. Valve area (VTI): 0.84 cm^2. Valve area (Vmax): 0.86 cm^2.. Aortic root dimension: 42 mm (ED) and aortic root mildly dilated;  b. 10/2014 S/P TAVR w/ Edwards Sapien 3 THV 39mm;  c. 10/2014 Echo: EF 50%, nl fxning AoV, mean grad of 9, peak of 16.  Marland Kitchen Splenomegaly   . COPD (chronic obstructive pulmonary disease)   . Peripheral vascular disease     a. s/p L ray amp 10/13;  b. s/p L 2nd toe amp 12/13  . Pulmonary nodule     a. 46mm RUL calcified pulm nodule noted on CT staging for lymphoma - stable 10/2012.  . Leg DVT (deep venous thromboembolism), chronic  a. LE dopplers 5/13, 10/13 and 1/14: chronic DVT involving right mid femoral vein, left mid femoral vein, and left popliteal vein.  . Osteomyelitis     a. L first foot ray amputation 09/2012  . Heart murmur   . Anginal pain   . Myocardial infarction ?1995  . Pneumonia ?2014  . DM2 (diabetes mellitus, type 2)   . DJD (degenerative joint disease)   . 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 Stage III s/p bendamustine, Rituxan therapy  . S/P TAVR (transcatheter aortic valve  replacement) 11/05/2014    a. 29 mm Edwards Sapien 3 transcatheter heart valve placed via open right transfemoral approach   Past Surgical History  Procedure Laterality Date  . Coronary artery bypass graft  1995  . Shoulder arthroscopy w/ rotator cuff repair Right   . Carpal tunnel release Bilateral   . Incisional hernia repair    . Kidney surgery      "cut me 1/2 in 2 for stones"  . Back surgery    . I&d extremity  09/16/2012    Procedure: IRRIGATION AND DEBRIDEMENT EXTREMITY;  Surgeon: Richard Simmer, MD;  Location: [facility];  Service: Orthopedics;  Laterality: Left;  irrigation and debridement and first Ray amputation of left foot  . Amputation  09/16/2012    Procedure: AMPUTATION RAY;  Surgeon: Richard Simmer, MD;  Location: [facility];  Service: Orthopedics;  Laterality: Left;  first Ray amputation left foot  . Amputation  11/21/2012    Procedure: AMPUTATION RAY;  Surgeon: Richard Simmer, MD;  Location: [place];  Service: Orthopedics;  Laterality: Left;  LEFT SECOND TOE AMPUTATION  . I&d extremity Left 03/01/2013    Procedure: IRRIGATION AND DEBRIDEMENT EXTREMITY;  Surgeon: Richard Simmer, MD;  Location: [facility];  Service: Orthopedics;  Laterality: Left;  IRRIGATION  AND  DEBRIDEMENT  LEFT  FOOT  . Amputation Left 12/20/2013    Procedure: AMPUTATION LEFT 5TH TRANSMETATARSAL;  Surgeon: Richard Simmer, MD;  Location: [place];  Service: Orthopedics;  Laterality: Left;  . Achilles tendon lengthening Left 12/20/2013    Procedure: ACHILLES TENDON LENGTHENING;  Surgeon: Richard Simmer, MD;  Location: [place];  Service: Orthopedics;  Laterality: Left;  . Hernia repair    . Coronary angioplasty with stent placement  09/2014; 10/14/2014    "?2; 1"  . Cataract extraction w/ intraocular lens  implant, bilateral Bilateral   . Transcatheter aortic valve replacement, transfemoral N/A 11/05/2014    Procedure: TRANSCATHETER AORTIC VALVE REPLACEMENT, TRANSFEMORAL;  Surgeon: Richard Mocha, MD;  Location: [facility] OR; Berniece Pap 3 THV  (size 29 mm, model # F048547, serial # [id number])   . Intraoperative transesophageal echocardiogram N/A 11/05/2014    Procedure: INTRAOPERATIVE TRANSESOPHAGEAL ECHOCARDIOGRAM;  Surgeon: Richard Mocha, MD;  Location: [facility] OR;  Service: Open Heart Surgery;  Laterality: N/A;  . Left and right heart catheterization with coronary/graft angiogram N/A 02/03/2012    Procedure: LEFT AND RIGHT HEART CATHETERIZATION WITH Beatrix Fetters;  Surgeon: Richard Mocha, MD;  Location: [facility] CATH LAB;  Service: Cardiovascular;  Laterality: N/A;  . Percutaneous stent intervention  02/03/2012    Procedure: PERCUTANEOUS STENT INTERVENTION;  Surgeon: Richard Mocha, MD;  Location: [facility] CATH LAB;  Service: Cardiovascular;;  . Left heart catheterization with coronary/graft angiogram N/A 02/14/2012    Procedure: LEFT HEART CATHETERIZATION WITH Beatrix Fetters;  Surgeon: Peter M Martinique, MD;  Location: [facility] CATH LAB;  Service: Cardiovascular;  Laterality: N/A;  . Left and right heart catheterization with coronary/graft angiogram  N/A 09/23/2014    Procedure: LEFT AND RIGHT HEART CATHETERIZATION WITH Beatrix Fetters;  Surgeon: Blane Ohara, MD;  Location: [facility] CATH LAB;  Service: Cardiovascular;  Laterality: N/A;  . Percutaneous coronary stent intervention (pci-s)  10/14/2014    Procedure: PERCUTANEOUS CORONARY STENT INTERVENTION (PCI-S);  Surgeon: Blane Ohara, MD;  Location: [facility] CATH LAB;  Service: Cardiovascular;;    Medications: Prior to Admission medications   Medication Sig Start Date End Date Taking? Authorizing Provider  aspirin EC 81 MG tablet Take 81 mg by mouth every morning.    Yes Historical Provider, MD  clopidogrel (PLAVIX) 75 MG tablet Take 1 tablet (75 mg total) by mouth daily. 10/15/14  Yes Almyra Deforest, PA  gabapentin (NEURONTIN) 300 MG capsule Take 300-600 mg by mouth 3 (three) times daily. Take 600 mg in the morning, 600 mg at midday, and 300 mg at bedtime. 03/03/13  Yes Haywood Pao,  MD  isosorbide mononitrate (IMDUR) 30 MG 24 hr tablet TAKE ONE TABLET BY MOUTH ONCE DAILY 10/25/14  Yes Blane Ohara, MD  metFORMIN (GLUCOPHAGE) 500 MG tablet Take 1 tablet (500 mg total) by mouth 2 (two) times daily with a meal. HOLD today, restart on 11/08/2014. 11/07/14  Yes Rhonda G Barrett, PA-C  nitroGLYCERIN (NITROSTAT) 0.4 MG SL tablet Place 0.4 mg under the tongue every 5 (five) minutes as needed for chest pain.   Yes Historical Provider, MD  pravastatin (PRAVACHOL) 40 MG tablet Take 40 mg by mouth daily.   Yes Historical Provider, MD  vitamin B-12 (CYANOCOBALAMIN) 500 MCG tablet Take 1,000 mcg by mouth daily.    Yes Historical Provider, MD  metoprolol succinate (TOPROL-XL) 25 MG 24 hr tablet Take 25 mg by mouth daily. PT TAKES 1/2 TAB IN AM AND 1/2 PM    Historical Provider, MD    Allergies:   Allergies  Allergen Reactions  . Losartan Other (See Comments)    Hyperkalemia > 7  . Azithromycin Itching  . Contrast Media [Iodinated Diagnostic Agents] Rash  . Doxycycline Itching       . Keflex [Cephalexin] Itching  . Macrodantin Itching  . Minocycline Hcl Itching  . Nitrofurantoin Itching  . Zithromax [Azithromycin Dihydrate] Itching    Social History:  reports that he quit smoking about 29 years ago. His smoking use included Cigarettes and Pipe. He has a 30 pack-year smoking history. He has quit using smokeless tobacco. His smokeless tobacco use included Chew. He reports that he does not drink alcohol or use illicit drugs.  Family History: Family History  Problem Relation Age of Onset  . Coronary artery disease    . Alcohol abuse      Physical Exam: Filed Vitals:   11/19/14 2200 11/19/14 2215 11/19/14 2230 11/19/14 2330  BP: 135/55 114/78 134/51 130/94  Pulse: 68 52 29 89  Temp:      TempSrc:      Resp: 20 20 21 22   SpO2: 95% 94% 96% 97%   General appearance: alert and no distress Head: Normocephalic, without obvious abnormality, atraumatic Eyes:  conjunctivae/corneas clear. PERRL, EOM's intact.  Nose: Nares normal. Septum midline. Mucosa normal. No drainage or sinus tenderness. Throat: lips, mucosa, and tongue normal; teeth and gums normal Neck: no adenopathy, no carotid bruit, no JVD and thyroid not enlarged, symmetric, no tenderness/mass/nodules Resp: Mild bilateral rhonchi. Cardio: Frequent ectopy; no murmurs rubs or gallops GI: soft, non-tender; bowel sounds normal; no masses,  no organomegaly Extremities: extremities normal, atraumatic, no cyanosis ; trace  bilateral lower extremity edema; status post left ray amputation  Pulses: Diminished bilaterally  Lymph nodes: no cervical lymphadenopathy Neurologic: Alert and oriented X 3, normal strength and tone. Normal symmetric reflexes.   Labs on Admission:   Recent Labs  11/19/14 1744  NA 136*  K 4.2  CL 98  CO2 24  GLUCOSE 159*  BUN 16  CREATININE 0.89  CALCIUM 9.3     Recent Labs  11/19/14 1744  WBC 4.6  NEUTROABS 3.3  HGB 10.2*  HCT 31.1*  MCV 96.0  PLT 61*    Lab Results  Component Value Date   INR 1.28 11/05/2014   INR 1.10 11/01/2014   INR 1.07 10/14/2014   Troponin (Point of Care Test)  Recent Labs  11/19/14 2021  TROPIPOC 0.06   BNP    Component Value Date/Time   PROBNP 2133.0* 11/19/2014 1936   Lactic Acid 0.68  Urinalysis    Component Value Date/Time   COLORURINE YELLOW 11/19/2014 2003   APPEARANCEUR CLEAR 11/19/2014 2003   LABSPEC 1.018 11/19/2014 2003   LABSPEC 1.030 03/16/2012 1236   PHURINE 6.0 11/19/2014 2003   GLUCOSEU NEGATIVE 11/19/2014 2003   HGBUR SMALL* 11/19/2014 2003   BILIRUBINUR NEGATIVE 11/19/2014 2003   KETONESUR NEGATIVE 11/19/2014 2003   PROTEINUR 100* 11/19/2014 2003   UROBILINOGEN 1.0 11/19/2014 2003   NITRITE NEGATIVE 11/19/2014 2003   LEUKOCYTESUR NEGATIVE 11/19/2014 2003   0-2 WBCs, 0-2 RBCs  Blood/Urine Cultures pending Influenza swab pending  Radiological Exams on Admission: Dg Chest 2  View  11/19/2014   CLINICAL DATA:  Weakness. Cough. Initial encounter. Fever and nausea.  EXAM: CHEST  2 VIEW  COMPARISON:  Fall/ 01/2014.  FINDINGS: Since the prior exam, the support apparatus has been removed. Postoperative changes of median sternotomy and CABG. Cardiopericardial silhouette is mildly enlarged for projection. This is a chronic finding. There is pulmonary vascular congestion. Basilar atelectasis. No alveolar pulmonary edema. Trans catheter aortic valve replacement.  IMPRESSION: Cardiomegaly, basilar atelectasis and pulmonary vascular congestion. No pulmonary edema pure at postsurgical changes of the chest.   Electronically Signed   By: Dereck Ligas M.D.   On: 11/19/2014 21:19   Orders placed or performed during the hospital encounter of 11/19/14  . ED EKG  . ED EKG  . EKG 12-Lead  . EKG 12-Lead    Assessment/Plan Principal Problem: 1.  Fever- acute onset fever with broad differential diagnosis including influenza/viral illness, endocarditis in the setting of recent TAVR, lymphoma, or other infectious cause. Blood and urine cultures are pending. Influenza swab pending.  Will obtain echocardiogram to evaluate aortic valve though no peripheral stigmata of endocarditis.   We'll start empiric vancomycin, Levaquin (Penicillin/Keflex allergy), and Tamiflu. De-escalate antibiotics if cultures are negative. Active Problems: 2. Mantle cell lymphoma- no evidence of disease on CT though he has persistent splenomegaly. Needs follow-up with hematology. 3. Other pancytopenia-chronic pancytopenia is likely related to splenomegaly and Mantle cell lymphoma. Consider hematology consult. 4. S/P TAVR (transcatheter aortic valve replacement)- no apparent complications though fever is concerning for possible endocarditis.  No other features to suggest.  [facility] input. 5. Type 2 diabetes mellitus with vascular disease-continue metformin and sliding scale insulin. No evidence of diabetic  foot ulcers.  Marton Redwood 11/20/2014, 12:09 AM

## 2014-11-20 NOTE — Progress Notes (Signed)
ANTIBIOTIC CONSULT NOTE - INITIAL  Pharmacy Consult for Vancomycin Indication: r/o endocarditis  Allergies  Allergen Reactions  . Losartan Other (See Comments)    Hyperkalemia > 7  . Azithromycin Itching  . Contrast Media [Iodinated Diagnostic Agents] Rash  . Doxycycline Itching       . Keflex [Cephalexin] Itching  . Macrodantin Itching  . Minocycline Hcl Itching  . Nitrofurantoin Itching  . Zithromax [Azithromycin Dihydrate] Itching    Patient Measurements: Height: 6' (182.9 cm) Weight: 185 lb 3 oz (84 kg) IBW/kg (Calculated) : 77.6  Vital Signs: Temp: 98 F (36.7 C) (12/16 0128) Temp Source: Oral (12/15 2151) BP: 152/76 mmHg (12/16 0128) Pulse Rate: 54 (12/16 0128) Intake/Output from previous day:   Intake/Output from this shift:    Labs:  Recent Labs  11/19/14 1744  WBC 4.6  HGB 10.2*  PLT 61*  CREATININE 0.89   Estimated Creatinine Clearance: 66.6 mL/min (by C-G formula based on Cr of 0.89). No results for input(s): VANCOTROUGH, VANCOPEAK, VANCORANDOM, GENTTROUGH, GENTPEAK, GENTRANDOM, TOBRATROUGH, TOBRAPEAK, TOBRARND, AMIKACINPEAK, AMIKACINTROU, AMIKACIN in the last 72 hours.   Microbiology: Recent Results (from the past 720 hour(s))  Surgical pcr screen     Status: None   Collection Time: 11/01/14  1:13 PM  Result Value Ref Range Status   MRSA, PCR NEGATIVE NEGATIVE Final   Staphylococcus aureus NEGATIVE NEGATIVE Final    Comment:        The Xpert SA Assay (FDA approved for NASAL specimens in patients over 32 years of age), is one component of a comprehensive surveillance program.  Test performance has been validated by [employer] for patients greater than or equal to 78 year old. It is not intended to diagnose infection nor to guide or monitor treatment.     Medical History: Past Medical History  Diagnosis Date  . CAD (coronary artery disease)     a. s/p CABG 1995;  b. NSTEMI & subsequent BMS to SVG->right PDA 02/03/12.;  c. Cath  02/14/2012 3vd with 4/4 patent grafts and patent stent in vg->pda;  d. 10/2014 PCI/DES to the VG->PDA.  . Carotid stenosis     a. Carotid dopplers 3/23: RICA 55-73%, LICA 2-20%;  b. dopplers 3/14:  25-42% RICA, 7-06% LICA  . HTN (hypertension)   . HLD (hyperlipidemia)   . Asthmatic bronchitis   . BPH (benign prostatic hypertrophy)   . Herpes zoster   . Anemia   . Thrombocytopenia     a. secondary to splenomegaly related to lymphoma with suspicion of bone marrow involvement. (Plavix had to be stopped due to this)  . Aortic stenosis, severe     a. ECHO 07/22/14 Severe AS. Peak and mean gradients of 57 mmHg and 42 mmHg, respectively. Calculated AVA is 0.8-0.9 cm2. Trivial AR. Valve area (VTI): 0.84 cm^2. Valve area (Vmax): 0.86 cm^2.. Aortic root dimension: 42 mm (ED) and aortic root mildly dilated;  b. 10/2014 S/P TAVR w/ Edwards Sapien 3 THV 46mm;  c. 10/2014 Echo: EF 50%, nl fxning AoV, mean grad of 9, peak of 16.  Marland Kitchen Splenomegaly   . COPD (chronic obstructive pulmonary disease)   . Peripheral vascular disease     a. s/p L ray amp 10/13;  b. s/p L 2nd toe amp 12/13  . Pulmonary nodule     a. 66mm RUL calcified pulm nodule noted on CT staging for lymphoma - stable 10/2012.  . Leg DVT (deep venous thromboembolism), chronic     a. LE dopplers 5/13, 10/13  and 1/14: chronic DVT involving right mid femoral vein, left mid femoral vein, and left popliteal vein.  . Osteomyelitis     a. L first foot ray amputation 09/2012  . Heart murmur   . Anginal pain   . Myocardial infarction ?1995  . Pneumonia ?2014  . DM2 (diabetes mellitus, type 2)   . DJD (degenerative joint disease)   . 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 Stage III s/p bendamustine, Rituxan therapy  . 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 29 mm Edwards Sapien 3 transcatheter heart valve placed via open right transfemoral approach    Medications:  Prescriptions prior to admission   Medication Sig Dispense Refill Last Dose  . aspirin EC 81 MG tablet Take 81 mg by mouth every morning.    11/19/2014 at Unknown time  . clopidogrel (PLAVIX) 75 MG tablet Take 1 tablet (75 mg total) by mouth daily. 30 tablet 11 11/19/2014 at Unknown time  . gabapentin (NEURONTIN) 300 MG capsule Take 300-600 mg by mouth 3 (three) times daily. Take 600 mg in the morning, 600 mg at midday, and 300 mg at bedtime.   11/19/2014 at Unknown time  . isosorbide mononitrate (IMDUR) 30 MG 24 hr tablet TAKE ONE TABLET BY MOUTH ONCE DAILY 90 tablet 0 11/19/2014 at Unknown time  . metFORMIN (GLUCOPHAGE) 500 MG tablet Take 1 tablet (500 mg total) by mouth 2 (two) times daily with a meal. HOLD today, restart on 11/08/2014.   11/19/2014 at Unknown time  . nitroGLYCERIN (NITROSTAT) 0.4 MG SL tablet Place 0.4 mg under the tongue every 5 (five) minutes as needed for chest pain.   Past Month at Unknown time  . pravastatin (PRAVACHOL) 40 MG tablet Take 40 mg by mouth daily.   11/19/2014 at Unknown time  . vitamin B-12 (CYANOCOBALAMIN) 500 MCG tablet Take 1,000 mcg by mouth daily.    11/19/2014 at Unknown time  . metoprolol succinate (TOPROL-XL) 25 MG 24 hr tablet Take 25 mg by mouth daily. PT TAKES 1/2 TAB IN AM AND 1/2 PM   Taking   Assessment: 80 yo male with fevers s/p recent TAVR, possible endocarditis, for empiric antibiotics  Goal of Therapy:  Vancomycin trough level 15-20 mcg/ml  Plan:  Vancomycin 1500 mg IV now, then  Vancomycin 1250 mg IV q24h  Meryem Haertel, Bronson Curb 11/20/2014,1:37 AM

## 2014-11-20 NOTE — Progress Notes (Signed)
New Admission Note:  Arrival Method: via stretcher with RN Mental Orientation:Alert&oriented x4 Telemetry: placed on box 18, CCMD notified Assessment: Completed Skin: redness on arms, legs, shoulder Pain: denies any pain Tubes: N/A Safety Measures: Safety Fall Prevention Plan was given, discussed and signed. Admission: initiated [place] Orientation: Patient has been orientated to the room, unit and the staff. Family: None at bedside  Orders have been reviewed and implemented. Will continue to monitor the patient. Call light has been placed within reach and bed alarm has been activated.   Leandro Reasoner BSN, RN  Phone Number: [phone] [facility]

## 2014-11-21 LAB — CBC WITH DIFFERENTIAL/PLATELET
Basophils Absolute: 0 10*3/uL (ref 0.0–0.1)
Basophils Relative: 0 % (ref 0–1)
EOS PCT: 4 % (ref 0–5)
Eosinophils Absolute: 0.1 10*3/uL (ref 0.0–0.7)
HCT: 31.5 % — ABNORMAL LOW (ref 39.0–52.0)
Hemoglobin: 10.1 g/dL — ABNORMAL LOW (ref 13.0–17.0)
LYMPHS ABS: 0.6 10*3/uL — AB (ref 0.7–4.0)
LYMPHS PCT: 20 % (ref 12–46)
MCH: 31.3 pg (ref 26.0–34.0)
MCHC: 32.1 g/dL (ref 30.0–36.0)
MCV: 97.5 fL (ref 78.0–100.0)
Monocytes Absolute: 0.5 10*3/uL (ref 0.1–1.0)
Monocytes Relative: 17 % — ABNORMAL HIGH (ref 3–12)
Neutro Abs: 1.7 10*3/uL (ref 1.7–7.7)
Neutrophils Relative %: 58 % (ref 43–77)
PLATELETS: 62 10*3/uL — AB (ref 150–400)
RBC: 3.23 MIL/uL — ABNORMAL LOW (ref 4.22–5.81)
RDW: 15.7 % — ABNORMAL HIGH (ref 11.5–15.5)
WBC: 2.9 10*3/uL — AB (ref 4.0–10.5)

## 2014-11-21 LAB — BASIC METABOLIC PANEL
Anion gap: 14 (ref 5–15)
BUN: 15 mg/dL (ref 6–23)
CHLORIDE: 104 meq/L (ref 96–112)
CO2: 23 meq/L (ref 19–32)
Calcium: 9.6 mg/dL (ref 8.4–10.5)
Creatinine, Ser: 0.95 mg/dL (ref 0.50–1.35)
GFR calc Af Amer: 86 mL/min — ABNORMAL LOW (ref >90)
GFR, EST NON AFRICAN AMERICAN: 74 mL/min — AB (ref >90)
GLUCOSE: 123 mg/dL — AB (ref 70–99)
POTASSIUM: 4.3 meq/L (ref 3.7–5.3)
SODIUM: 141 meq/L (ref 137–147)

## 2014-11-21 LAB — GLUCOSE, CAPILLARY
GLUCOSE-CAPILLARY: 131 mg/dL — AB (ref 70–99)
GLUCOSE-CAPILLARY: 95 mg/dL (ref 70–99)
Glucose-Capillary: 144 mg/dL — ABNORMAL HIGH (ref 70–99)
Glucose-Capillary: 122 mg/dL — ABNORMAL HIGH (ref 70–99)
Glucose-Capillary: 144 mg/dL — ABNORMAL HIGH (ref 70–99)

## 2014-11-21 LAB — URINE CULTURE
COLONY COUNT: NO GROWTH
CULTURE: NO GROWTH

## 2014-11-21 MED ORDER — WHITE PETROLATUM GEL
Status: AC
Start: 2014-11-21 — End: 2014-11-21
  Administered 2014-11-21: 1
  Filled 2014-11-21: qty 5

## 2014-11-21 NOTE — Progress Notes (Signed)
Patient refusing bed alarm this evening shift.  Re-educated patient on importance of utilizing bed alarm; patient still refused.  Non-skid socks on.  Bed in lowest position.  Emphasized use of call bell if needed to use the bathroom; verbalized understanding.  Will continue to monitor patient. 

## 2014-11-21 NOTE — Progress Notes (Signed)
Subjective: Continues to have a dry cough without significant dyspnea, chest pain, nausea, abdominal pain, diarrhea, or dysuria  Objective: Vital signs in last 24 hours: Temp:  [97.8 F (36.6 C)-98.4 F (36.9 C)] 98.3 F (36.8 C) (12/17 0525) Pulse Rate:  [59-75] 73 (12/17 0525) Resp:  [18-19] 18 (12/17 0525) BP: (147-155)/(68-90) 147/74 mmHg (12/17 0525) SpO2:  [96 %-98 %] 98 % (12/17 0525) Weight change:    Intake/Output from previous day: 12/16 0701 - 12/17 0700 In: 700 [P.O.:700] Out: 1250 [Urine:1250]   General appearance: alert, no distress and with frequent dry cough Resp: clear to auscultation bilaterally and and heart had a slightly irregular rhythm without signficant murmur, abdomen had normal bowel sounds and no tenderness, extremities were without significant edema, he was alert and well oriented with a normal affect and could move all extremities well  Lab Results:  Recent Labs  11/20/14 0544 11/21/14 0509  WBC 2.8* 2.9*  HGB 9.8* 10.1*  HCT 29.7* 31.5*  PLT 53* 62*   BMET  Recent Labs  11/20/14 0544 11/21/14 0509  NA 142 141  K 4.0 4.3  CL 106 104  CO2 22 23  GLUCOSE 141* 123*  BUN 14 15  CREATININE 0.88 0.95  CALCIUM 9.0 9.6   CMET CMP     Component Value Date/Time   NA 141 11/21/2014 0509   NA 137 11/23/2013 0945   K 4.3 11/21/2014 0509   K 7.2* 11/23/2013 0945   CL 104 11/21/2014 0509   CL 108* 05/30/2013 0935   CO2 23 11/21/2014 0509   CO2 19* 11/23/2013 0945   GLUCOSE 123* 11/21/2014 0509   GLUCOSE 95 11/23/2013 0945   GLUCOSE 109* 05/30/2013 0935   BUN 15 11/21/2014 0509   BUN 37.4* 11/23/2013 0945   CREATININE 0.95 11/21/2014 0509   CREATININE 1.6* 11/23/2013 0945   CALCIUM 9.6 11/21/2014 0509   CALCIUM 10.2 11/23/2013 0945   PROT 5.8* 11/19/2014 1936   PROT 6.3* 11/23/2013 0945   ALBUMIN 3.9 11/19/2014 1936   ALBUMIN 4.2 11/23/2013 0945   AST 24 11/19/2014 1936   AST 23 11/23/2013 0945   ALT 17 11/19/2014 1936   ALT  19 11/23/2013 0945   ALKPHOS 126* 11/19/2014 1936   ALKPHOS 126 11/23/2013 0945   BILITOT 1.0 11/19/2014 1936   BILITOT 0.41 11/23/2013 0945   GFRNONAA 74* 11/21/2014 0509   GFRAA 86* 11/21/2014 0509    CBG (last 3)   Recent Labs  11/20/14 1702 11/20/14 2049 11/21/14 0752  GLUCAP 117* 125* 131*    INR RESULTS:   Lab Results  Component Value Date   INR 1.28 11/05/2014   INR 1.10 11/01/2014   INR 1.07 10/14/2014     Studies/Results: Dg Chest 2 View  11/19/2014   CLINICAL DATA:  Weakness. Cough. Initial encounter. Fever and nausea.  EXAM: CHEST  2 VIEW  COMPARISON:  Fall/ 01/2014.  FINDINGS: Since the prior exam, the support apparatus has been removed. Postoperative changes of median sternotomy and CABG. Cardiopericardial silhouette is mildly enlarged for projection. This is a chronic finding. There is pulmonary vascular congestion. Basilar atelectasis. No alveolar pulmonary edema. Trans catheter aortic valve replacement.  IMPRESSION: Cardiomegaly, basilar atelectasis and pulmonary vascular congestion. No pulmonary edema pure at postsurgical changes of the chest.   Electronically Signed   By: Dereck Ligas M.D.   On: 11/19/2014 21:19    Medications: I have reviewed the patient's current medications.  Assessment/Plan: #1 Fevers: resolved and cultures are negative  to date. Cause of fever was likely a viral infection (not flu). Will recheck labs in the am and if cultures remain negative will discharge to home tomorrow. #2 DM2: stable on SSI #3 COPD: stable on current meds.  LOS: 2 days   [name] G 11/21/2014, 8:12 AM

## 2014-11-22 DIAGNOSIS — R509 Fever, unspecified: Secondary | ICD-10-CM | POA: Diagnosis present

## 2014-11-22 LAB — BASIC METABOLIC PANEL
Anion gap: 12 (ref 5–15)
BUN: 15 mg/dL (ref 6–23)
CALCIUM: 9.7 mg/dL (ref 8.4–10.5)
CO2: 25 mEq/L (ref 19–32)
Chloride: 105 mEq/L (ref 96–112)
Creatinine, Ser: 0.99 mg/dL (ref 0.50–1.35)
GFR calc Af Amer: 84 mL/min — ABNORMAL LOW (ref >90)
GFR, EST NON AFRICAN AMERICAN: 73 mL/min — AB (ref >90)
GLUCOSE: 125 mg/dL — AB (ref 70–99)
Potassium: 4.3 mEq/L (ref 3.7–5.3)
SODIUM: 142 meq/L (ref 137–147)

## 2014-11-22 LAB — CBC WITH DIFFERENTIAL/PLATELET
BASOS ABS: 0 10*3/uL (ref 0.0–0.1)
Basophils Relative: 0 % (ref 0–1)
EOS ABS: 0.1 10*3/uL (ref 0.0–0.7)
EOS PCT: 4 % (ref 0–5)
HCT: 31.2 % — ABNORMAL LOW (ref 39.0–52.0)
Hemoglobin: 10.4 g/dL — ABNORMAL LOW (ref 13.0–17.0)
Lymphocytes Relative: 27 % (ref 12–46)
Lymphs Abs: 0.7 10*3/uL (ref 0.7–4.0)
MCH: 32.2 pg (ref 26.0–34.0)
MCHC: 33.3 g/dL (ref 30.0–36.0)
MCV: 96.6 fL (ref 78.0–100.0)
Monocytes Absolute: 0.4 10*3/uL (ref 0.1–1.0)
Monocytes Relative: 15 % — ABNORMAL HIGH (ref 3–12)
Neutro Abs: 1.4 10*3/uL — ABNORMAL LOW (ref 1.7–7.7)
Neutrophils Relative %: 53 % (ref 43–77)
PLATELETS: 64 10*3/uL — AB (ref 150–400)
RBC: 3.23 MIL/uL — ABNORMAL LOW (ref 4.22–5.81)
RDW: 15.5 % (ref 11.5–15.5)
WBC: 2.7 10*3/uL — ABNORMAL LOW (ref 4.0–10.5)

## 2014-11-22 LAB — GLUCOSE, CAPILLARY: Glucose-Capillary: 135 mg/dL — ABNORMAL HIGH (ref 70–99)

## 2014-11-22 MED ORDER — ENSURE COMPLETE PO LIQD: 237.0000 mL | Freq: Two times a day (BID) | ORAL | Status: DC

## 2014-11-22 NOTE — Care Management Note (Signed)
CARE MANAGEMENT NOTE 11/22/2014  Patient:  ABDISHAKUR, [name]   Account Number:  [id number]  Date Initiated:  11/22/2014  Documentation initiated by:  Zamaya Rapaport  Subjective/Objective Assessment:   CM following for progression and d/c planning.     Action/Plan:   Met with pt and wife and no d/c needs identified.   Anticipated DC Date:  11/22/2014   Anticipated DC Plan:  HOME/SELF CARE         Choice offered to / List presented to:             Status of service:  Completed, signed off Medicare Important Message given?  YES (If response is "NO", the following Medicare IM given date fields will be blank) Date Medicare IM given:  11/22/2014 Medicare IM given by:  Kylina Vultaggio Date Additional Medicare IM given:   Additional Medicare IM given by:    Discharge Disposition:  HOME/SELF CARE  Per UR Regulation:    If discussed at Long Length of Stay Meetings, dates discussed:    Comments:

## 2014-11-22 NOTE — Discharge Summary (Signed)
Physician Discharge Summary  Patient ID: [name] MRN: [id number] DOB/AGE: [birth year] 80 y.o.  Admit date: 11/19/2014 Discharge date: 11/22/2014   Discharge Diagnoses:  Principal Problem:   Fever of unknown origin (FUO) Active Problems:   Other pancytopenia   Mantle cell lymphoma   S/P TAVR (transcatheter aortic valve replacement)   Fever   Type 2 diabetes mellitus with vascular disease   Discharged Condition: good  Hospital Course: [name] is a 80 year old white male with aortic stenosis status post recent TAVR (12/1), coronary artery disease status post CABG with recent PCI (11/15), Mantle cell lymphoma with chronic pancytopenia and splenomegaly, and diabetes mellitus with vascular palpitations status post left foot ray amputation secondary to osteomyelitis who presented to the emergency department with a complaint of fever. Patient states that he has been doing well since his recent TAVR with no complications. This afternoon he developed sudden onset fever, chills, and mild malaise. He states that he felt flulike. He reports that his wife called our office and since Dr. Philip [name] was not in, she called the cardiologist office who recommended that he come to the emergency department for evaluation due to his recent TAVR. In the emergency department he is found to have a temperature of 102, normal blood pressure, heart rate, and white blood count of 4.6. Chest x-ray shows no acute cardiopulmonary disease. Given fever and recent aortic valve procedure he is being admitted for further management and evaluation to rule out endocarditis. He denies any other focal symptoms or potential source of fever-specifically denies sinus pain, congestion, nausea/vomiting/diarrhea, dysuria or flank pain. Incision in the right groin has healed without any erythema or drainage. He does have a mild cough which is nonproductive and chronic. No ulcerations or skin rash.  He reports that he has completed  his chemotherapy for his lymphoma with no evidence of disease. He has not seen oncology since 12/14. CT in 11/15 showed findings compatible with cirrhosis with stigmata of portal hypertension including mild portal vein dilatation and slow megaly but no evidence of lymphoma.  He was seen in the emergency room and had a chest x-ray that did not show evidence of pneumonia with a CBC not showing leukocytosis, urinalysis normal, and blood and urine cultures were obtained. He was started on empiric broad-spectrum antibiotic treatment for possible bacterial infection or endocarditis. At the time of dictation his blood and urine cultures were negative, and testing for influenza type A and B, and H1 N1 were negative. During his hospitalization he was also followed closely by a cardiologist. A transthoracic echocardiogram was done which showed left ventricular ejection fraction of 55-60% with incessant atrial ectopy, aortic valve area of 1.57 cm, severe left atrial enlargement, moderate right atrial enlargement, mild right ventricular enlargement, estimated pulmonary artery pressure systolic was 43 and there were no valvular vegetations seen. On the day of discharge he had not had recurrent fever or chills and felt fine. He continued to have his usual chronic dry cough without shortness of breath, and he denied chest pain, nausea, abdominal pain, diarrhea, constipation, or painful urination. Procedures during his hospitalization included a transthoracic echocardiogram. There were no complications during this hospitalization. Note that the processes of discharge took 35 minutes.  Consults: cardiology  Significant Diagnostic Studies:  No results found.  Labs: Lab Results  Component Value Date   WBC 2.7* 11/22/2014   HGB 10.4* 11/22/2014   HCT 31.2* 11/22/2014   MCV 96.6 11/22/2014   PLT 64* 11/22/2014  Recent Labs Lab 11/19/14 1936  11/22/14 0500  NA  --   < > 142  K  --   < > 4.3  CL  --   < > 105   CO2  --   < > 25  BUN  --   < > 15  CREATININE  --   < > 0.99  CALCIUM  --   < > 9.7  PROT 5.8*  --   --   BILITOT 1.0  --   --   ALKPHOS 126*  --   --   ALT 17  --   --   AST 24  --   --   GLUCOSE  --   < > 125*  < > = values in this interval not displayed.     Lab Results  Component Value Date   INR 1.28 11/05/2014   INR 1.10 11/01/2014   INR 1.07 10/14/2014     Recent Results (from the past 240 hour(s))  Urine culture     Status: None   Collection Time: 11/19/14  8:03 PM  Result Value Ref Range Status   Specimen Description URINE, CLEAN CATCH  Final   Special Requests NONE  Final   Culture  Setup Time   Final    11/20/2014 04:37 Performed at [facility] Performed at [facility]   Final   Culture NO GROWTH Performed at [facility]   Final   Report Status 11/21/2014 FINAL  Final  Blood culture (routine x 2)     Status: None (Preliminary result)   Collection Time: 11/19/14  9:44 PM  Result Value Ref Range Status   Specimen Description BLOOD RIGHT FOREARM  Final   Special Requests BOTTLES DRAWN AEROBIC AND ANAEROBIC 10CC  Final   Culture  Setup Time   Final    11/20/2014 08:34 Performed at [facility]    Culture   Final           BLOOD CULTURE RECEIVED NO GROWTH TO DATE CULTURE WILL BE HELD FOR 5 DAYS BEFORE ISSUING A FINAL NEGATIVE REPORT Performed at [facility]    Report Status PENDING  Incomplete  Blood culture (routine x 2)     Status: None (Preliminary result)   Collection Time: 11/19/14  9:55 PM  Result Value Ref Range Status   Specimen Description BLOOD RIGHT HAND  Final   Special Requests BOTTLES DRAWN AEROBIC AND ANAEROBIC 5 CC  Final   Culture  Setup Time   Final    11/20/2014 08:34 Performed at [facility]    Culture   Final           BLOOD CULTURE RECEIVED NO GROWTH TO DATE CULTURE WILL BE HELD FOR 5 DAYS BEFORE ISSUING A FINAL NEGATIVE REPORT Performed at  [facility]    Report Status PENDING  Incomplete    12-lead EKG results: Normal sinus rhythm with frequent premature atrial contractions, old anteroseptal myocardial infarction  Discharge Exam: Blood pressure 145/98, pulse 85, temperature 98.5 F (36.9 C), temperature source Oral, resp. rate 18, height 6' (1.829 m), weight 84.3 kg (185 lb 13.6 oz), SpO2 94 %.  Physical Exam: In general, he is an elderly white man who was in no apparent distress while lying partially upright in bed. HEENT exam was significant for mild dry eyes, neck was supple without jugular venous distention or carotid bruit, chest was clear to  auscultation with hyperexpanded lungs, heart had a slightly irregular rhythm without significant murmur, abdomen had normal bowel sounds and no hepatosplenomegaly or tenderness, extremities were without cyanosis, clubbing, or edema. He was alert and well oriented with normal affect and could move all extremities well.  Disposition: He will be discharged to home today in the company of his wife.  Discharge Instructions    Call MD for:    Complete by:  As directed   Call for recurrent fevers, chills, difficulty breathing, chest pain, diarrhea, or any other concerning symptoms     Diet - low sodium heart healthy    Complete by:  As directed      Discharge instructions    Complete by:  As directed   Call our office at [phone] to set up an after hospitalization visit in 3 weeks     Increase activity slowly    Complete by:  As directed             Medication List    TAKE these medications        aspirin EC 81 MG tablet  Take 81 mg by mouth every morning.     clopidogrel 75 MG tablet  Commonly known as:  PLAVIX  Take 1 tablet (75 mg total) by mouth daily.     gabapentin 300 MG capsule  Commonly known as:  NEURONTIN  Take 300-600 mg by mouth 3 (three) times daily. Take 600 mg in the morning, 600 mg at midday, and 300 mg at bedtime.     isosorbide mononitrate 30  MG 24 hr tablet  Commonly known as:  IMDUR  TAKE ONE TABLET BY MOUTH ONCE DAILY     metFORMIN 500 MG tablet  Commonly known as:  GLUCOPHAGE  Take 1 tablet (500 mg total) by mouth 2 (two) times daily with a meal. HOLD today, restart on 11/08/2014.     metoprolol succinate 25 MG 24 hr tablet  Commonly known as:  TOPROL-XL  Take 25 mg by mouth daily. PT TAKES 1/2 TAB IN AM AND 1/2 PM     nitroGLYCERIN 0.4 MG SL tablet  Commonly known as:  NITROSTAT  Place 0.4 mg under the tongue every 5 (five) minutes as needed for chest pain.     pravastatin 40 MG tablet  Commonly known as:  PRAVACHOL  Take 40 mg by mouth daily.     vitamin B-12 500 MCG tablet  Commonly known as:  CYANOCOBALAMIN  Take 1,000 mcg by mouth daily.           Follow-up Information    Follow up with Donnajean Lopes, MD. Schedule an appointment as soon as possible for a visit in 3 weeks.   Specialty:  Internal Medicine   Contact information:   [place] [address] [phone]       Signed: Donnajean Lopes 11/22/2014, 7:33 AM

## 2014-11-25 ENCOUNTER — Telehealth: Payer: Self-pay | Admitting: Cardiovascular Disease

## 2014-11-25 NOTE — Telephone Encounter (Signed)
Left message on machine for [name] to contact the office. Per TAVR protocol the pt does need to repeat echo 12/31/14.

## 2014-11-25 NOTE — Telephone Encounter (Signed)
New Message  Pt wife called. Requests a call back to determine if the ECHO on 12/31/2013 is needed being that he had one on 11/19/2014. Please call

## 2014-11-26 LAB — CULTURE, BLOOD (ROUTINE X 2)
CULTURE: NO GROWTH
CULTURE: NO GROWTH

## 2014-11-26 NOTE — Telephone Encounter (Signed)
Pt's wife aware that pt needs to keep 12/31/14 appointments.

## 2014-12-06 DIAGNOSIS — D696 Thrombocytopenia, unspecified: Secondary | ICD-10-CM

## 2014-12-06 DIAGNOSIS — I25810 Atherosclerosis of coronary artery bypass graft(s) without angina pectoris: Secondary | ICD-10-CM

## 2014-12-06 DIAGNOSIS — E785 Hyperlipidemia, unspecified: Secondary | ICD-10-CM

## 2014-12-06 DIAGNOSIS — I350 Nonrheumatic aortic (valve) stenosis: Secondary | ICD-10-CM

## 2014-12-06 DIAGNOSIS — Z954 Presence of other heart-valve replacement: Secondary | ICD-10-CM

## 2014-12-31 ENCOUNTER — Ambulatory Visit (HOSPITAL_COMMUNITY): Payer: Medicare Other | Attending: Cardiology

## 2014-12-31 ENCOUNTER — Ambulatory Visit (INDEPENDENT_AMBULATORY_CARE_PROVIDER_SITE_OTHER): Payer: Medicare Other | Admitting: Cardiovascular Disease

## 2014-12-31 ENCOUNTER — Encounter: Payer: Self-pay | Admitting: Cardiovascular Disease

## 2014-12-31 VITALS — BP 178/62 | HR 46 | Ht 72.0 in | Wt 179.4 lb

## 2014-12-31 DIAGNOSIS — I255 Ischemic cardiomyopathy: Secondary | ICD-10-CM | POA: Insufficient documentation

## 2014-12-31 DIAGNOSIS — I359 Nonrheumatic aortic valve disorder, unspecified: Secondary | ICD-10-CM

## 2014-12-31 DIAGNOSIS — I350 Nonrheumatic aortic (valve) stenosis: Secondary | ICD-10-CM | POA: Insufficient documentation

## 2014-12-31 MED ORDER — CLOPIDOGREL BISULFATE 75 MG PO TABS: 75.0000 mg | ORAL_TABLET | Freq: Every day | ORAL | Status: DC

## 2014-12-31 NOTE — Progress Notes (Signed)
2D Echo completed. 12/31/2014

## 2014-12-31 NOTE — Patient Instructions (Signed)
Your physician recommends that you continue on your current medications as directed. Please refer to the Current Medication list given to you today.  Your physician recommends that you schedule a follow-up appointment in: 4 MONTHS with Dr Burt Knack

## 2014-12-31 NOTE — Progress Notes (Signed)
Cardiology Office Note   Date:  12/31/2014   ID:  ROBBY BULKLEY, DOB Jan 24, 1927, MRN [id number]  PCP:  Donnajean Lopes, MD  Cardiologist:  [name], MD    Chief Complaint  Patient presents with  . Hospitalization Follow-up    had ECHO today    History of Present Illness: ERON STAAT is a 81 y.o. male who presents for follow-up evaluation after undergoing TAVR 11/06/2014 for treatment of severe symptomatic aortic stenosis. He was treated with a 29 mm Sapien 3 transcatheter heart valve via an open right transfemoral approach. The patient has a history of coronary artery disease status post CABG. He underwent PCI of the saphenous vein graft RCA prior to TAVR because of critical in-stent restenosis in that graft. The patient's other medical problems include lymphoma status post Rituxan therapy and limited mobility after left first ray amputation in 2013.  The patient is doing well. He was hospitalized for observation of a fever in mid December. A viral illness was suspected. He's had no other issues related to this. He denies any specific cardiac complaints. He denies chest pain, chest pressure, shortness of breath, leg swelling, heart palpitations, or lightheadedness.   Past Medical History  Diagnosis Date  . CAD (coronary artery disease)     a. s/p CABG 1995;  b. NSTEMI & subsequent BMS to SVG->right PDA 02/03/12.;  c. Cath 02/14/2012 3vd with 4/4 patent grafts and patent stent in vg->pda;  d. 10/2014 PCI/DES to the VG->PDA.  . Carotid stenosis     a. Carotid dopplers 8/33: RICA 82-50%, LICA 5-39%;  b. dopplers 3/14:  76-73% RICA, 4-19% LICA  . HTN (hypertension)   . HLD (hyperlipidemia)   . Asthmatic bronchitis   . BPH (benign prostatic hypertrophy)   . Herpes zoster   . Anemia   . Thrombocytopenia     a. secondary to splenomegaly related to lymphoma with suspicion of bone marrow involvement. (Plavix had to be stopped due to this)  . Aortic stenosis, severe     a.  ECHO 07/22/14 Severe AS. Peak and mean gradients of 57 mmHg and 42 mmHg, respectively. Calculated AVA is 0.8-0.9 cm2. Trivial AR. Valve area (VTI): 0.84 cm^2. Valve area (Vmax): 0.86 cm^2.. Aortic root dimension: 42 mm (ED) and aortic root mildly dilated;  b. 10/2014 S/P TAVR w/ Edwards Sapien 3 THV 42mm;  c. 10/2014 Echo: EF 50%, nl fxning AoV, mean grad of 9, peak of 16.  Marland Kitchen Splenomegaly   . COPD (chronic obstructive pulmonary disease)   . Peripheral vascular disease     a. s/p L ray amp 10/13;  b. s/p L 2nd toe amp 12/13  . Pulmonary nodule     a. 63mm RUL calcified pulm nodule noted on CT staging for lymphoma - stable 10/2012.  . Leg DVT (deep venous thromboembolism), chronic     a. LE dopplers 5/13, 10/13 and 1/14: chronic DVT involving right mid femoral vein, left mid femoral vein, and left popliteal vein.  . Osteomyelitis     a. L first foot ray amputation 09/2012  . Heart murmur   . Anginal pain   . Myocardial infarction ?1995  . Pneumonia ?2014  . DM2 (diabetes mellitus, type 2)   . DJD (degenerative joint disease)   . 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 Stage III s/p bendamustine, Rituxan therapy  . 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 29 mm Edwards Sapien 3  transcatheter heart valve placed via open right transfemoral approach    Past Surgical History  Procedure Laterality Date  . Coronary artery bypass graft  1995  . Shoulder arthroscopy w/ rotator cuff repair Right   . Carpal tunnel release Bilateral   . Incisional hernia repair    . Kidney surgery      "cut me 1/2 in 2 for stones"  . Back surgery    . I&d extremity  09/16/2012    Procedure: IRRIGATION AND DEBRIDEMENT EXTREMITY;  Surgeon: Wylene Simmer, MD;  Location: [facility];  Service: Orthopedics;  Laterality: Left;  irrigation and debridement and first Ray amputation of left foot  . Amputation  09/16/2012    Procedure: AMPUTATION RAY;  Surgeon: Wylene Simmer, MD;  Location:  [facility];  Service: Orthopedics;  Laterality: Left;  first Ray amputation left foot  . Amputation  11/21/2012    Procedure: AMPUTATION RAY;  Surgeon: Wylene Simmer, MD;  Location: [facility];  Service: Orthopedics;  Laterality: Left;  LEFT SECOND TOE AMPUTATION  . I&d extremity Left 03/01/2013    Procedure: IRRIGATION AND DEBRIDEMENT EXTREMITY;  Surgeon: Wylene Simmer, MD;  Location: [facility];  Service: Orthopedics;  Laterality: Left;  IRRIGATION  AND  DEBRIDEMENT  LEFT  FOOT  . Amputation Left 12/20/2013    Procedure: AMPUTATION LEFT 5TH TRANSMETATARSAL;  Surgeon: Wylene Simmer, MD;  Location: [facility];  Service: Orthopedics;  Laterality: Left;  . Achilles tendon lengthening Left 12/20/2013    Procedure: ACHILLES TENDON LENGTHENING;  Surgeon: Wylene Simmer, MD;  Location: [facility];  Service: Orthopedics;  Laterality: Left;  . Hernia repair    . Coronary angioplasty with stent placement  09/2014; 10/14/2014    "?2; 1"  . Cataract extraction w/ intraocular lens  implant, bilateral Bilateral   . Transcatheter aortic valve replacement, transfemoral N/A 11/05/2014    Procedure: TRANSCATHETER AORTIC VALVE REPLACEMENT, TRANSFEMORAL;  Surgeon: [name], MD;  Location: [facility] OR; Berniece Pap 3 THV (size 29 mm, model # F048547, serial # [id number])   . Intraoperative transesophageal echocardiogram N/A 11/05/2014    Procedure: INTRAOPERATIVE TRANSESOPHAGEAL ECHOCARDIOGRAM;  Surgeon: [name], MD;  Location[facility];  Service: Open Heart Surgery;  Laterality: N/A;  . Left and right heart catheterization with coronary/graft angiogram N/A 02/03/2012    Procedure: LEFT AND RIGHT HEART CATHETERIZATION WITH Beatrix Fetters;  Surgeon: [name], MD;  Location: [facility] CATH LAB;  Service: Cardiovascular;  Laterality: N/A;  . Percutaneous stent intervention  02/03/2012    Procedure: PERCUTANEOUS STENT INTERVENTION;  Surgeon: [name], MD;  Location: [facility] CATH LAB;  Service: Cardiovascular;;  . Left heart catheterization  with coronary/graft angiogram N/A 02/14/2012    Procedure: LEFT HEART CATHETERIZATION WITH Beatrix Fetters;  Surgeon: Peter M Martinique, MD;  Location: [facility] CATH LAB;  Service: Cardiovascular;  Laterality: N/A;  . Left and right heart catheterization with coronary/graft angiogram N/A 09/23/2014    Procedure: LEFT AND RIGHT HEART CATHETERIZATION WITH Beatrix Fetters;  Surgeon: Blane Ohara, MD;  Location: [facility] CATH LAB;  Service: Cardiovascular;  Laterality: N/A;  . Percutaneous coronary stent intervention (pci-s)  10/14/2014    Procedure: PERCUTANEOUS CORONARY STENT INTERVENTION (PCI-S);  Surgeon: Blane Ohara, MD;  Location: [facility] CATH LAB;  Service: Cardiovascular;;    Current Outpatient Prescriptions  Medication Sig Dispense Refill  . aspirin EC 81 MG tablet Take 81 mg by mouth every morning.     . clopidogrel (PLAVIX) 75 MG tablet Take 1 tablet (75 mg total) by mouth daily.  90 tablet 3  . gabapentin (NEURONTIN) 300 MG capsule Take 300-600 mg by mouth 3 (three) times daily. Take 600 mg in the morning, 600 mg at midday, and 300 mg at bedtime.    . isosorbide mononitrate (IMDUR) 30 MG 24 hr tablet TAKE ONE TABLET BY MOUTH ONCE DAILY 90 tablet 0  . metFORMIN (GLUCOPHAGE) 500 MG tablet Take 1 tablet (500 mg total) by mouth 2 (two) times daily with a meal. HOLD today, restart on 11/08/2014.    . nitroGLYCERIN (NITROSTAT) 0.4 MG SL tablet Place 0.4 mg under the tongue every 5 (five) minutes as needed for chest pain.    . pravastatin (PRAVACHOL) 40 MG tablet Take 40 mg by mouth daily.    . vitamin B-12 (CYANOCOBALAMIN) 500 MCG tablet Take 1,000 mcg by mouth daily.      No current facility-administered medications for this visit.    Allergies:   Losartan; Azithromycin; Contrast media; Doxycycline; Keflex; Macrodantin; Minocycline hcl; Nitrofurantoin; and Zithromax   Social History:  The patient  reports that he quit smoking about 29 years ago. His smoking use included Cigarettes and  Pipe. He has a 30 pack-year smoking history. He has quit using smokeless tobacco. His smokeless tobacco use included Chew. He reports that he does not drink alcohol or use illicit drugs.   Family History:  The patient's  family history includes Alcohol abuse in an other family member; Coronary artery disease in an other family member.    ROS:  Please see the history of present illness.  Otherwise, review of systems is positive for decreased appetite, cough, and wheezing. Also positive for a rash on the right face from chronic herpes zoster.  All other systems are reviewed and negative.    PHYSICAL EXAM: VS:  BP 178/62 mmHg  Pulse 46  Ht 6' (1.829 m)  Wt 179 lb 6.4 oz (81.375 kg)  BMI 24.33 kg/m2  SpO2 95% , BMI Body mass index is 24.33 kg/(m^2). GEN: Well nourished, well developed, pleasant elderly male in no acute distress HEENT: normal Neck: no JVD, carotid bruits, or masses Cardiac: RRR with a soft grade 2/6 early peaking systolic ejection murmur at the left sternal border, no edema Respiratory:  clear to auscultation bilaterally, normal work of breathing GI: soft, nontender, nondistended, + BS MS: no deformity or atrophy Skin: Rash noted right forehead Neuro:  Strength and sensation are intact Psych: euthymic mood, full affect  EKG:  EKG is not ordered today.  Recent Labs: 11/06/2014: Magnesium 1.8 11/19/2014: ALT 17; Pro B Natriuretic peptide (BNP) 2133.0* 11/22/2014: BUN 15; Creatinine 0.99; Hemoglobin 10.4*; Platelets 64*; Potassium 4.3; Sodium 142   Lipid Panel     Component Value Date/Time   CHOL 111 02/02/2012 0910   TRIG 76 02/02/2012 0910   HDL 24* 02/02/2012 0910   CHOLHDL 4.6 02/02/2012 0910   VLDL 15 02/02/2012 0910   LDLCALC 72 02/02/2012 0910   LDLDIRECT 66.0 02/22/2008 0928     Wt Readings from Last 3 Encounters:  12/31/14 179 lb 6.4 oz (81.375 kg)  11/21/14 185 lb 13.6 oz (84.3 kg)  11/13/14 188 lb (85.276 kg)     Other studies Reviewed: 2D Echo  12/31/2013: Study Conclusions  - Left ventricle: The cavity size was mildly dilated. Systolic function was normal. The estimated ejection fraction was in the range of 55% to 60%. There is akinesis of the inferior myocardium. Doppler parameters are consistent with abnormal left ventricular relaxation (grade 1 diastolic dysfunction). - Aortic valve: TAVR  11/05/14: Edwards Sapien 3 THV (size 29 mm, model # [id number], serial # [id number]) Valve is functioning normally, no perivalvular regurgitation. - Mitral valve: Moderately calcified annulus. - Left atrium: The atrium was mildly to moderately dilated. - Right atrium: The atrium was mildly dilated. - Pulmonary arteries: Systolic pressure was mildly increased. PA peak pressure: 44 mm Hg (S).   ASSESSMENT AND PLAN: 1.  Severe aortic valve stenosis status post TAVR. I personally reviewed the patient's echo images. His valve is functioning normally with no paravalvular aortic insufficiency. He will continue on his current medical program which includes dual antiplatelet therapy with aspirin and Plavix. Currently with New York Heart Association functional class I symptoms.  2. Coronary artery disease status post CABG and recent vein graft PCI. He has no anginal symptoms at present. His medications will be continued without change.  3. Essential hypertension: Well-controlled  4. Hyperlipidemia. He continues on pravastatin.  Current medicines are reviewed with the patient today.  The patient does not have concerns regarding medicines.  The following changes have been made:  no change  Labs/ tests ordered today include: none  No orders of the defined types were placed in this encounter.    Disposition:   FU with me in 4 months.  Signed, [name], MD  12/31/2014 5:56 PM    [address], [place], [place]  [address] Phone: [phone]; Fax: [phone]

## 2015-01-30 ENCOUNTER — Other Ambulatory Visit: Payer: Self-pay | Admitting: Cardiovascular Disease

## 2015-01-31 ENCOUNTER — Other Ambulatory Visit: Payer: Self-pay

## 2015-02-26 ENCOUNTER — Other Ambulatory Visit (HOSPITAL_COMMUNITY): Payer: Self-pay | Admitting: Cardiology

## 2015-02-26 DIAGNOSIS — I6523 Occlusion and stenosis of bilateral carotid arteries: Secondary | ICD-10-CM

## 2015-03-03 ENCOUNTER — Ambulatory Visit (HOSPITAL_COMMUNITY): Payer: Medicare Other | Attending: Internal Medicine | Admitting: Cardiology

## 2015-03-03 ENCOUNTER — Telehealth: Payer: Self-pay | Admitting: Cardiovascular Disease

## 2015-03-03 DIAGNOSIS — I6523 Occlusion and stenosis of bilateral carotid arteries: Secondary | ICD-10-CM | POA: Diagnosis not present

## 2015-03-03 NOTE — Progress Notes (Signed)
Carotid duplex performed 

## 2015-03-03 NOTE — Telephone Encounter (Signed)
New Message  Pt wife calling- Dr [name] [facility] (in town today [phone] needs to speak w/ our office about pt having squamous cells removed. Please call back and discuss.

## 2015-03-03 NOTE — Progress Notes (Signed)
Registration staff came and got me to look at a patient who was oozing blood from his elbow where he hit it on the chair. Koban was placed over the existing bandage. The bleeding stopped. Neuro, motor, and sensory present before and after dressed and bandaged. Patient without complaint and sent home. Perrin Maltese EMTP

## 2015-03-03 NOTE — Telephone Encounter (Signed)
I attempted to reach Dr Ronnald Ramp and I pushed prompt for appointment and the phone continues to ring with no answer.  I have routed this note to Dr Ronnald Ramp through [place].

## 2015-03-03 NOTE — Telephone Encounter (Signed)
I spoke with the pt's wife and she contacted our office because Dr Ronnald Ramp called her today and said that he had attempted to reach our office multiple times and been unsuccessful. The pt had put off seeing Dermatology while being evaluated for TAVR. The pt finally went to dermatology on 02/04/15 and had a squamous and basal cell removed.  Dr Ronnald Ramp needs to speak with Dr Burt Knack in regards to having more procedures.  Dr Ronnald Ramp is in the office today and can be reached at [phone], he will be out on vacation the remainder of the week.  I will forward this message to Dr Burt Knack to contact Dr Ronnald Ramp.

## 2015-03-03 NOTE — Telephone Encounter (Signed)
Called Dr Ronnald Ramp at number given. Went to physician line, no answer and no voicemail. Please leave him my cell # [phone]. thx  [name] 03/03/2015 3:17 PM

## 2015-03-05 NOTE — Telephone Encounter (Signed)
Follow up      Returning a nurses call to get carotid results

## 2015-03-05 NOTE — Telephone Encounter (Signed)
I spoke with the pt's wife and made her aware of carotid results.  I made her aware that Dr Ronnald Ramp had not contacted Dr Burt Knack at this time.

## 2015-04-21 IMAGING — CR DG CHEST 1V PORT
1 series · 1 of 1 positions shown · non-contrast
Comparison: 11/05/2014.

CLINICAL DATA: Aortic valve replacement.

EXAM:
PORTABLE CHEST - 1 VIEW

[AP]
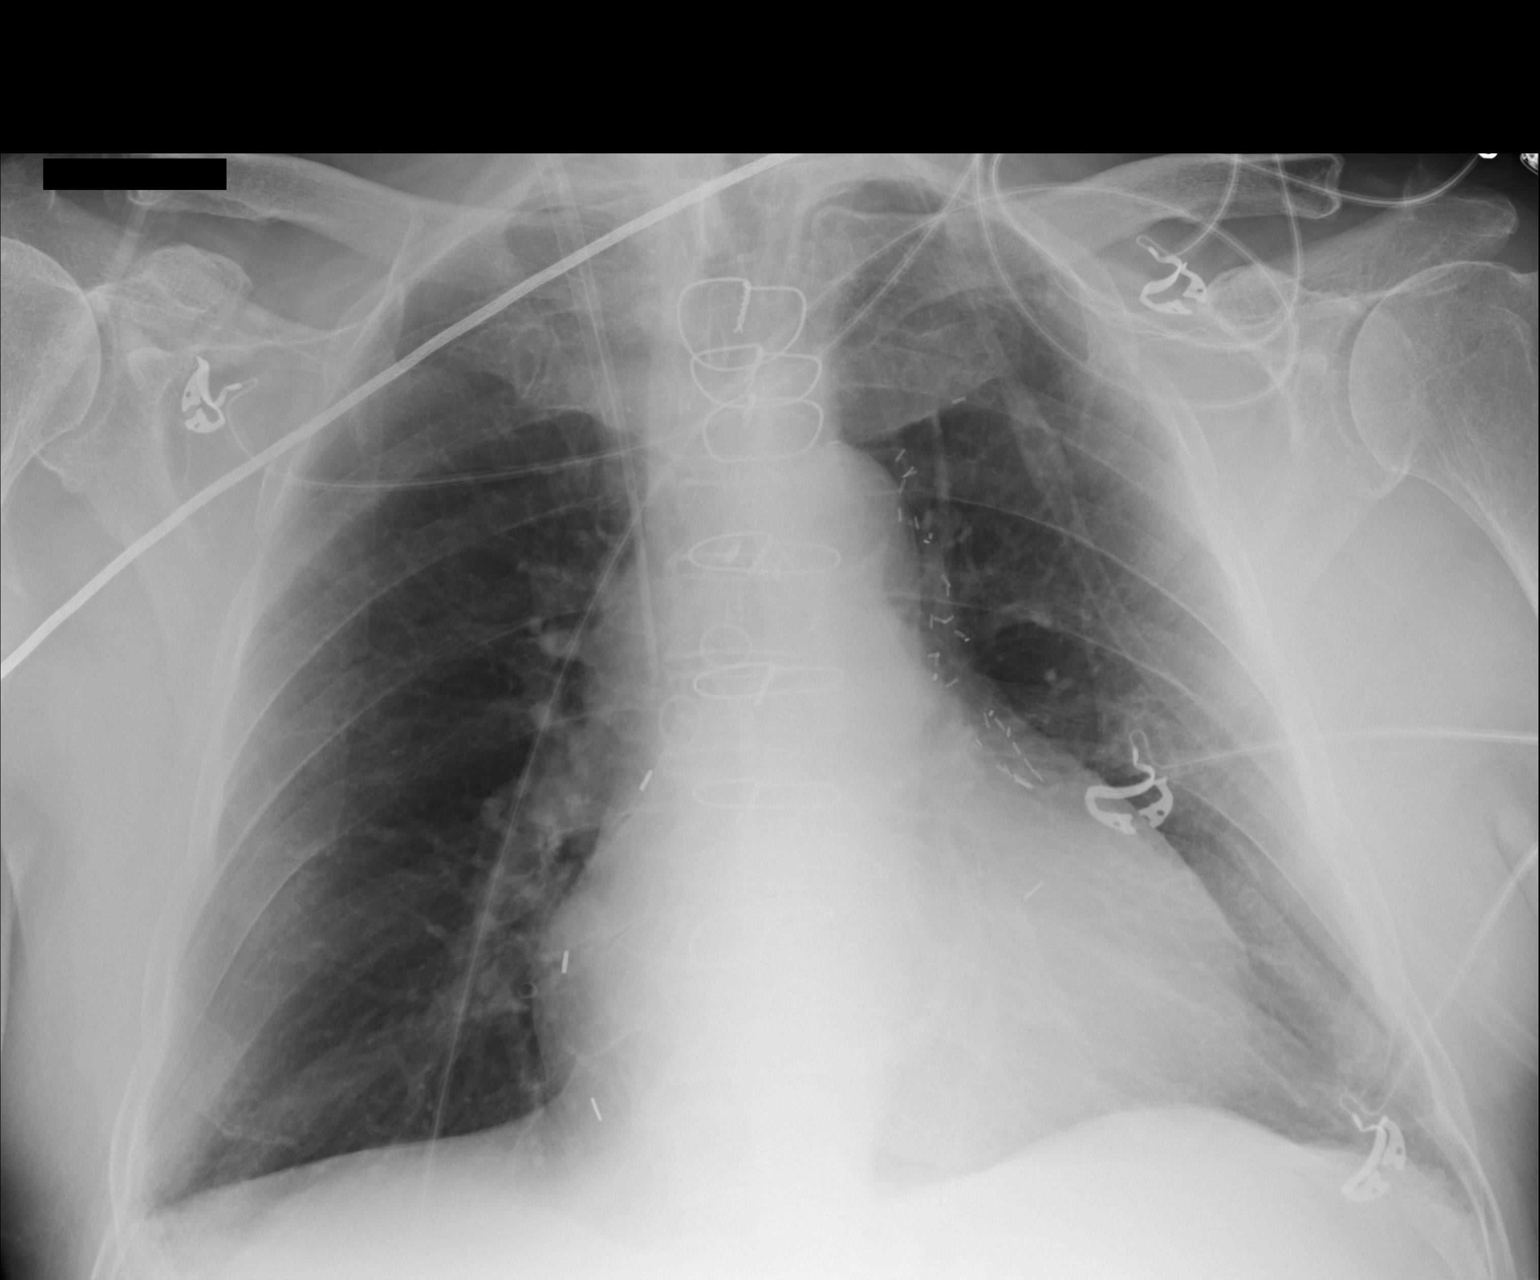

[1 of 1 positions shown; findings below may reference images not displayed]

FINDINGS: Right IJ sheath and IJ line noted in superior vena cava. Interim
removal Swan-Ganz catheter. Cardiomegaly. Prior CABG. Basilar
atelectasis. No pleural effusion or pneumothorax. No acute osseus
abnormality .
IMPRESSION: 1. Interim removal of Swan-Ganz catheter. Right IJ sheath and
central line in stable position.
2. Cardiomegaly.  Prior CABG.  No CHF.
3. Mild basilar atelectasis.

## 2015-05-19 ENCOUNTER — Ambulatory Visit (INDEPENDENT_AMBULATORY_CARE_PROVIDER_SITE_OTHER): Payer: Medicare Other | Admitting: Cardiovascular Disease

## 2015-05-19 ENCOUNTER — Encounter: Payer: Self-pay | Admitting: Cardiovascular Disease

## 2015-05-19 VITALS — BP 122/58 | HR 66 | Ht 72.0 in | Wt 187.0 lb

## 2015-05-19 DIAGNOSIS — I350 Nonrheumatic aortic (valve) stenosis: Secondary | ICD-10-CM

## 2015-05-19 DIAGNOSIS — I255 Ischemic cardiomyopathy: Secondary | ICD-10-CM | POA: Diagnosis not present

## 2015-05-19 DIAGNOSIS — Z954 Presence of other heart-valve replacement: Secondary | ICD-10-CM | POA: Diagnosis not present

## 2015-05-19 DIAGNOSIS — Z952 Presence of prosthetic heart valve: Secondary | ICD-10-CM

## 2015-05-19 DIAGNOSIS — I10 Essential (primary) hypertension: Secondary | ICD-10-CM

## 2015-05-19 NOTE — Patient Instructions (Signed)
Medication Instructions:  Your physician recommends that you continue on your current medications as directed. Please refer to the Current Medication list given to you today.  Labwork: No new orders.  Testing/Procedures: Your physician has requested that you have an echocardiogram in 6 MONTHS. Echocardiography is a painless test that uses sound waves to create images of your heart. It provides your doctor with information about the size and shape of your heart and how well your heart's chambers and valves are working. This procedure takes approximately one hour. There are no restrictions for this procedure.  Follow-Up: Your physician wants you to follow-up in: 6 MONTHS with Dr Burt Knack.  You will receive a reminder letter in the mail two months in advance. If you don't receive a letter, please call our office to schedule the follow-up appointment.   Any Other Special Instructions Will Be Listed Below (If Applicable).

## 2015-05-19 NOTE — Progress Notes (Signed)
Cardiology Office Note   Date:  05/19/2015   ID:  MAZE CORNIEL, DOB [birth year], MRN [id number]  PCP:  Donnajean Lopes, MD  Cardiologist:  [name], MD    Chief Complaint  Patient presents with  . Aortic Stenosis    History of Present Illness: [name] is a 81 y.o. male who presents for follow-up evaluation. The patient has a history of coronary artery disease with remote CABG. He developed severe symptomatic aortic stenosis and underwent TAVR 11/06/2014. Just prior to TAVR, he was found to have severe occlusive disease of the saphenous vein graft to RCA and he was treated with a drug-eluting stent. He did have a transient fever requiring hospitalization in December but was felt to have a viral illness and recovered shortly thereafter. The patient's other medical problems include lymphoma status post Rituxan therapy and limited mobility after left first ray amputation in 2013.  He's not very active anymore. Has problems with balance and walks with a cane. He does get out around [place] and he enjoys being outside. He's had chronic left ankle swelling since undergoing first ray amputation. Had a duplex scan last week showing no evidence of DVT. He denies chest pain or shortness of breath.  Past Medical History  Diagnosis Date  . CAD (coronary artery disease)     a. s/p CABG 1995;  b. NSTEMI & subsequent BMS to SVG->right PDA 02/03/12.;  c. Cath 02/14/2012 3vd with 4/4 patent grafts and patent stent in vg->pda;  d. 10/2014 PCI/DES to the VG->PDA.  . Carotid stenosis     a. Carotid dopplers 8/58: RICA 85-02%, LICA 7-74%;  b. dopplers 3/14:  12-87% RICA, 8-67% LICA  . HTN (hypertension)   . HLD (hyperlipidemia)   . Asthmatic bronchitis   . BPH (benign prostatic hypertrophy)   . Herpes zoster   . Anemia   . Thrombocytopenia     a. secondary to splenomegaly related to lymphoma with suspicion of bone marrow involvement. (Plavix had to be stopped due to this)  . Aortic  stenosis, severe     a. ECHO 07/22/14 Severe AS. Peak and mean gradients of 57 mmHg and 42 mmHg, respectively. Calculated AVA is 0.8-0.9 cm2. Trivial AR. Valve area (VTI): 0.84 cm^2. Valve area (Vmax): 0.86 cm^2.. Aortic root dimension: 42 mm (ED) and aortic root mildly dilated;  b. 10/2014 S/P TAVR w/ Edwards Sapien 3 THV 39mm;  c. 10/2014 Echo: EF 50%, nl fxning AoV, mean grad of 9, peak of 16.  Marland Kitchen Splenomegaly   . COPD (chronic obstructive pulmonary disease)   . Peripheral vascular disease     a. s/p L ray amp 10/13;  b. s/p L 2nd toe amp 12/13  . Pulmonary nodule     a. 71mm RUL calcified pulm nodule noted on CT staging for lymphoma - stable 10/2012.  . Leg DVT (deep venous thromboembolism), chronic     a. LE dopplers 5/13, 10/13 and 1/14: chronic DVT involving right mid femoral vein, left mid femoral vein, and left popliteal vein.  . Osteomyelitis     a. L first foot ray amputation 09/2012  . Heart murmur   . Anginal pain   . Myocardial infarction ?1995  . Pneumonia ?2014  . DM2 (diabetes mellitus, type 2)   . DJD (degenerative joint disease)   . 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 Stage III s/p bendamustine, Rituxan therapy  . 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 29 mm Edwards Sapien 3 transcatheter heart valve placed via open right transfemoral approach    Past Surgical History  Procedure Laterality Date  . Coronary artery bypass graft  1995  . Shoulder arthroscopy w/ rotator cuff repair Right   . Carpal tunnel release Bilateral   . Incisional hernia repair    . Kidney surgery      "cut me 1/2 in 2 for stones"  . Back surgery    . I&d extremity  09/16/2012    Procedure: IRRIGATION AND DEBRIDEMENT EXTREMITY;  Surgeon: Wylene Simmer, MD;  Location: [facility];  Service: Orthopedics;  Laterality: Left;  irrigation and debridement and first Ray amputation of left foot  . Amputation  09/16/2012    Procedure: AMPUTATION RAY;  Surgeon:  Wylene Simmer, MD;  Location: [facility];  Service: Orthopedics;  Laterality: Left;  first Ray amputation left foot  . Amputation  11/21/2012    Procedure: AMPUTATION RAY;  Surgeon: Wylene Simmer, MD;  Location: [place];  Service: Orthopedics;  Laterality: Left;  LEFT SECOND TOE AMPUTATION  . I&d extremity Left 03/01/2013    Procedure: IRRIGATION AND DEBRIDEMENT EXTREMITY;  Surgeon: Wylene Simmer, MD;  Location: [facility];  Service: Orthopedics;  Laterality: Left;  IRRIGATION  AND  DEBRIDEMENT  LEFT  FOOT  . Amputation Left 12/20/2013    Procedure: AMPUTATION LEFT 5TH TRANSMETATARSAL;  Surgeon: Wylene Simmer, MD;  Location: [place];  Service: Orthopedics;  Laterality: Left;  . Achilles tendon lengthening Left 12/20/2013    Procedure: ACHILLES TENDON LENGTHENING;  Surgeon: Wylene Simmer, MD;  Location: [place];  Service: Orthopedics;  Laterality: Left;  . Hernia repair    . Coronary angioplasty with stent placement  09/2014; 10/14/2014    "?2; 1"  . Cataract extraction w/ intraocular lens  implant, bilateral Bilateral   . Transcatheter aortic valve replacement, transfemoral N/A 11/05/2014    Procedure: TRANSCATHETER AORTIC VALVE REPLACEMENT, TRANSFEMORAL;  Surgeon: [name], MD;  Location: [facility] OR; Berniece Pap 3 THV (size 29 mm, model # F048547, serial # [id number])   . Intraoperative transesophageal echocardiogram N/A 11/05/2014    Procedure: INTRAOPERATIVE TRANSESOPHAGEAL ECHOCARDIOGRAM;  Surgeon: [name], MD;  Location: [facility] OR;  Service: Open Heart Surgery;  Laterality: N/A;  . Left and right heart catheterization with coronary/graft angiogram N/A 02/03/2012    Procedure: LEFT AND RIGHT HEART CATHETERIZATION WITH Beatrix Fetters;  Surgeon: [name], MD;  Location: [facility] CATH LAB;  Service: Cardiovascular;  Laterality: N/A;  . Percutaneous stent intervention  02/03/2012    Procedure: PERCUTANEOUS STENT INTERVENTION;  Surgeon: [name], MD;  Location: [facility] CATH LAB;  Service: Cardiovascular;;  .  Left heart catheterization with coronary/graft angiogram N/A 02/14/2012    Procedure: LEFT HEART CATHETERIZATION WITH Beatrix Fetters;  Surgeon: Peter M Martinique, MD;  Location: [facility] C[facility] LAB;  Service: Cardiovascular;  Laterality: N/A;  . Left and right heart catheterization with coronary/graft angiogram N/A 09/23/2014    Procedure: LEFT AND RIGHT HEART CATHETERIZATION WITH Beatrix Fetters;  Surgeon: Blane Ohara, MD;  Location: [facility] CATH LAB;  Service: Cardiovascular;  Laterality: N/A;  . Percutaneous coronary stent intervention (pci-s)  10/14/2014    Procedure: PERCUTANEOUS CORONARY STENT INTERVENTION (PCI-S);  Surgeon: Blane Ohara, MD;  Location: [facility] CATH LAB;  Service: Cardiovascular;;    Current Outpatient Prescriptions  Medication Sig Dispense Refill  . aspirin EC 81 MG tablet Take 81 mg by mouth every morning.     . clopidogrel (PLAVIX) 75 MG tablet Take 1 tablet (  75 mg total) by mouth daily. 90 tablet 3  . gabapentin (NEURONTIN) 300 MG capsule Take 300-600 mg by mouth 3 (three) times daily. Take 600 mg in the morning, 600 mg at midday, and 300 mg at bedtime.    . isosorbide mononitrate (IMDUR) 30 MG 24 hr tablet TAKE ONE TABLET BY MOUTH ONCE DAILY 90 tablet 1  . metFORMIN (GLUCOPHAGE) 500 MG tablet Take 1 tablet (500 mg total) by mouth 2 (two) times daily with a meal. HOLD today, restart on 11/08/2014.    . nitroGLYCERIN (NITROSTAT) 0.4 MG SL tablet Place 0.4 mg under the tongue every 5 (five) minutes as needed for chest pain.    . pravastatin (PRAVACHOL) 40 MG tablet Take 40 mg by mouth daily.    . vitamin B-12 (CYANOCOBALAMIN) 500 MCG tablet Take 1,000 mcg by mouth daily.      No current facility-administered medications for this visit.    Allergies:   Losartan; Azithromycin; Contrast media; Doxycycline; Keflex; Macrodantin; Minocycline hcl; Nitrofurantoin; and Zithromax   Social History:  The patient  reports that he quit smoking about 30 years ago. His smoking  use included Cigarettes and Pipe. He has a 30 pack-year smoking history. He has quit using smokeless tobacco. His smokeless tobacco use included Chew. He reports that he does not drink alcohol or use illicit drugs.   Family History:  The patient's  family history includes Alcohol abuse in an other family member; Coronary artery disease in an other family member; Other in his father and mother.    ROS:  Please see the history of present illness.  Otherwise, review of systems is positive for leg swelling, visual disturbance, balance problems.  All other systems are reviewed and negative.    PHYSICAL EXAM: VS:  BP 122/58 mmHg  Pulse 66  Ht 6' (1.829 m)  Wt 187 lb (84.823 kg)  BMI 25.36 kg/m2 , BMI Body mass index is 25.36 kg/(m^2). GEN: Well nourished, well developed, pleasant elderly male in no acute distress HEENT: normal Neck: no JVD, no masses. No carotid bruits Cardiac: RRR grade 2/6 ejection murmur at the right upper sternal border           Respiratory:  clear to auscultation bilaterally, normal work of breathing GI: soft, nontender, nondistended, + BS MS: no deformity or atrophy Ext: Trace pretibial edema on the right and 1+ pretibial edema on the left Skin: warm and dry, no rash Neuro:  Strength and sensation are intact Psych: euthymic mood, full affect  EKG:  EKG is ordered today. The ekg ordered today shows normal sinus rhythm 66 bpm, frequent premature atrial and ventricular contractions and nonspecific IVCD  Recent Labs: 11/06/2014: Magnesium 1.8 11/19/2014: ALT 17; Pro B Natriuretic peptide (BNP) 2133.0* 11/22/2014: BUN 15; Creatinine, Ser 0.99; Hemoglobin 10.4*; Platelets 64*; Potassium 4.3; Sodium 142   Lipid Panel     Component Value Date/Time   CHOL 111 02/02/2012 0910   TRIG 76 02/02/2012 0910   HDL 24* 02/02/2012 0910   CHOLHDL 4.6 02/02/2012 0910   VLDL 15 02/02/2012 0910   LDLCALC 72 02/02/2012 0910   LDLDIRECT 66.0 02/22/2008 0928      Wt Readings  from Last 3 Encounters:  05/19/15 187 lb (84.823 kg)  12/31/14 179 lb 6.4 oz (81.375 kg)  11/21/14 185 lb 13.6 oz (84.3 kg)     Cardiac Studies Reviewed: 2D Echo 12/31/2013: Study Conclusions  - Left ventricle: The cavity size was mildly dilated. Systolic function was normal. The estimated  ejection fraction was in the range of 55% to 60%. There is akinesis of the inferior myocardium. Doppler parameters are consistent with abnormal left ventricular relaxation (grade 1 diastolic dysfunction). - Aortic valve: TAVR 11/05/14: Edwards Sapien 3 THV (size 29 mm, model # [id number], serial # [id number]) Valve is functioning normally, no perivalvular regurgitation. - Mitral valve: Moderately calcified annulus. - Left atrium: The atrium was mildly to moderately dilated. - Right atrium: The atrium was mildly dilated. - Pulmonary arteries: Systolic pressure was mildly increased. PA peak pressure: 44 mm Hg (S).  ASSESSMENT AND PLAN: 1.  CAD status post CABG and PCI: Stable without symptoms of angina. Remains on dual antiplatelets therapy with aspirin and Plavix.  2. Severe aortic stenosis now 6 months out from TAVR: [place] Heart Association functional class II symptoms. Overall he seems to be stable. He has normal valve function based on most recent echo. He has a need for SBE prophylaxis. Will see him back for his yearly valve clinic visit with an echocardiogram.  3. Hyperlipidemia: Treated with pravastatin. Followed by primary care physician.   4. Type 2 diabetes: Also followed by PCP and treated with metformin.   Current medicines are reviewed with the patient today.  The patient does not have concerns regarding medicines.  Labs/ tests ordered today include:   Orders Placed This Encounter  Procedures  . EKG 12-Lead  . Echocardiogram    Disposition:   FU 6 months with an echo.   Deatra James, MD  05/19/2015 10:39 AM    [address], [place], [place]  [address] Phone: [phone]; Fax: [phone]

## 2015-07-22 ENCOUNTER — Other Ambulatory Visit: Payer: Self-pay | Admitting: Cardiovascular Disease

## 2015-11-21 ENCOUNTER — Encounter: Payer: Self-pay | Admitting: Cardiovascular Disease

## 2015-11-21 ENCOUNTER — Other Ambulatory Visit: Payer: Self-pay

## 2015-11-21 ENCOUNTER — Ambulatory Visit (HOSPITAL_COMMUNITY): Payer: Medicare Other | Attending: Cardiology

## 2015-11-21 ENCOUNTER — Ambulatory Visit (INDEPENDENT_AMBULATORY_CARE_PROVIDER_SITE_OTHER): Payer: Medicare Other | Admitting: Cardiovascular Disease

## 2015-11-21 VITALS — BP 118/64 | HR 66 | Ht 72.0 in | Wt 187.0 lb

## 2015-11-21 DIAGNOSIS — I350 Nonrheumatic aortic (valve) stenosis: Secondary | ICD-10-CM

## 2015-11-21 DIAGNOSIS — I10 Essential (primary) hypertension: Secondary | ICD-10-CM

## 2015-11-21 DIAGNOSIS — I255 Ischemic cardiomyopathy: Secondary | ICD-10-CM | POA: Diagnosis not present

## 2015-11-21 DIAGNOSIS — Z954 Presence of other heart-valve replacement: Secondary | ICD-10-CM

## 2015-11-21 DIAGNOSIS — E785 Hyperlipidemia, unspecified: Secondary | ICD-10-CM | POA: Diagnosis not present

## 2015-11-21 DIAGNOSIS — I340 Nonrheumatic mitral (valve) insufficiency: Secondary | ICD-10-CM | POA: Insufficient documentation

## 2015-11-21 DIAGNOSIS — Z952 Presence of prosthetic heart valve: Secondary | ICD-10-CM

## 2015-11-21 DIAGNOSIS — I517 Cardiomegaly: Secondary | ICD-10-CM | POA: Insufficient documentation

## 2015-11-21 DIAGNOSIS — I059 Rheumatic mitral valve disease, unspecified: Secondary | ICD-10-CM | POA: Diagnosis not present

## 2015-11-21 DIAGNOSIS — I5189 Other ill-defined heart diseases: Secondary | ICD-10-CM | POA: Insufficient documentation

## 2015-11-21 DIAGNOSIS — I071 Rheumatic tricuspid insufficiency: Secondary | ICD-10-CM | POA: Insufficient documentation

## 2015-11-21 DIAGNOSIS — I359 Nonrheumatic aortic valve disorder, unspecified: Secondary | ICD-10-CM | POA: Diagnosis present

## 2015-11-21 MED ORDER — NITROGLYCERIN 0.4 MG SL SUBL: 0.4000 mg | SUBLINGUAL_TABLET | SUBLINGUAL | Status: AC | PRN

## 2015-11-21 NOTE — Progress Notes (Signed)
Cardiology Office Note Date:  11/21/2015   ID:  KENNTH DEMORY, DOB January 18, 1927, MRN [id number]  PCP:  Donnajean Lopes, MD  Cardiologist:  [name], MD    Chief Complaint  Patient presents with  . Shortness of Breath   History of Present Illness: [name] is a 81 y.o. male who presents for follow-up evaluation after undergoing TAVR 11/06/2014 for treatment of severe symptomatic aortic stenosis. He was treated with a 29 mm Sapien 3 transcatheter heart valve via an open right transfemoral approach. The patient has a history of coronary artery disease status post CABG. He underwent PCI of the saphenous vein graft RCA prior to TAVR because of critical in-stent restenosis in that graft. The patient's other medical problems include lymphoma status post Rituxan therapy and limited mobility after left first ray amputation in 2013.  The patient is doing very well. He's had no cardiac symptoms since I saw him last. He specifically denies chest pain, chest pressure, orthopnea, PND, or dyspnea with exertion. He's had no leg swelling or heart palpitations. He denies lightheadedness or syncope.  Past Medical History  Diagnosis Date  . CAD (coronary artery disease)     a. s/p CABG 1995;  b. NSTEMI & subsequent BMS to SVG->right PDA 02/03/12.;  c. Cath 02/14/2012 3vd with 4/4 patent grafts and patent stent in vg->pda;  d. 10/2014 PCI/DES to the VG->PDA.  . Carotid stenosis     a. Carotid dopplers [id number]: RICA 123456, LICA [id number];  b. dopplers 3/14:  123456 RICA, [id number] LICA  . HTN (hypertension)   . HLD (hyperlipidemia)   . Asthmatic bronchitis   . BPH (benign prostatic hypertrophy)   . Herpes zoster   . Anemia   . Thrombocytopenia ([place])     a. secondary to splenomegaly related to lymphoma with suspicion of bone marrow involvement. (Plavix had to be stopped due to this)  . Aortic stenosis, severe     a. ECHO 07/22/14 Severe AS. Peak and mean gradients of 57 mmHg and 42 mmHg, respectively.  Calculated AVA is 0.8-0.9 cm2. Trivial AR. Valve area (VTI): 0.84 cm^2. Valve area (Vmax): 0.86 cm^2.. Aortic root dimension: 42 mm (ED) and aortic root mildly dilated;  b. 10/2014 S/P TAVR w/ Edwards Sapien 3 THV 84mm;  c. 10/2014 Echo: EF 50%, nl fxning AoV, mean grad of 9, peak of 16.  Marland Kitchen Splenomegaly   . COPD (chronic obstructive pulmonary disease) ([place])   . Peripheral vascular disease ([place])     a. s/p L ray amp 10/13;  b. s/p L 2nd toe amp 12/13  . Pulmonary nodule     a. 43mm RUL calcified pulm nodule noted on CT staging for lymphoma - stable 10/2012.  . Leg DVT (deep venous thromboembolism), chronic ([place])     a. LE dopplers 5/13, 10/13 and 1/14: chronic DVT involving right mid femoral vein, left mid femoral vein, and left popliteal vein.  . Osteomyelitis ([place])     a. L first foot ray amputation 09/2012  . Heart murmur   . Anginal pain ([place])   . Myocardial infarction [facility]) ?1995  . Pneumonia ?2014  . DM2 (diabetes mellitus, type 2) ([place])   . DJD (degenerative joint disease)   . 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 Stage III s/p bendamustine, Rituxan therapy  . 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 29 mm Edwards Sapien 3 transcatheter heart valve placed via open right transfemoral  approach    Past Surgical History  Procedure Laterality Date  . Coronary artery bypass graft  1995  . Shoulder arthroscopy w/ rotator cuff repair Right   . Carpal tunnel release Bilateral   . Incisional hernia repair    . Kidney surgery      "cut me 1/2 in 2 for stones"  . Back surgery    . I&d extremity  09/16/2012    Procedure: IRRIGATION AND DEBRIDEMENT EXTREMITY;  Surgeon: Wylene Simmer, MD;  Location: [facility];  Service: Orthopedics;  Laterality: Left;  irrigation and debridement and first Ray amputation of left foot  . Amputation  09/16/2012    Procedure: AMPUTATION RAY;  Surgeon: Wylene Simmer, MD;  [facility]: [facility];  Service: Orthopedics;   Laterality: Left;  first Ray amputation left foot  . Amputation  11/21/2012    Procedure: AMPUTATION RAY;  Surgeon: Wylene Simmer, MD;  Location: [place];  Service: Orthopedics;  Laterality: Left;  LEFT SECOND TOE AMPUTATION  . I&d extremity Left 03/01/2013    Procedure: IRRIGATION AND DEBRIDEMENT EXTREMITY;  Surgeon: Wylene Simmer, MD;  [facility];  Service: Orthopedics;  Laterality: Left;  IRRIGATION  AND  DEBRIDEMENT  LEFT  FOOT  . Amputation Left 12/20/2013    Procedure: AMPUTATION LEFT 5TH TRANSMETATARSAL;  Surgeon: Wylene Simmer, MD;  Location: [place];  Service: Orthopedics;  Laterality: Left;  . Achilles tendon lengthening Left 12/20/2013    Procedure: ACHILLES TENDON LENGTHENING;  Surgeon: Wylene Simmer, MD;  Location: [place];  Service: Orthopedics;  Laterality: Left;  . Hernia repair    . Coronary angioplasty with stent placement  09/2014; 10/14/2014    "?2; 1"  . Cataract extraction w/ intraocular lens  implant, bilateral Bilateral   . Transcatheter aortic valve replacement, transfemoral N/A 11/05/2014    Procedure: TRANSCATHETER AORTIC VALVE REPLACEMENT, TRANSFEMORAL;  Surgeon: [name], MD;  [facility]; Berniece Pap 3 THV (size 29 mm, model # F048547, serial # [id number])   . Intraoperative transesophageal echocardiogram N/A 11/05/2014    Procedure: INTRAOPERATIVE TRANSESOPHAGEAL ECHOCARDIOGRAM;  Surgeon: [name], MD;  Location: [facility] OR;  Service: Open Heart Surgery;  Laterality: N/A;  . Left and right heart catheterization with coronary/graft angiogram N/A 02/03/2012    Procedure: LEFT AND RIGHT HEART CATHETERIZATION WITH Beatrix Fetters;  Surgeon: [name], MD;  Location: [facility] CATH LAB;  Service: Cardiovascular;  Laterality: N/A;  . Percutaneous stent intervention  02/03/2012    Procedure: PERCUTANEOUS STENT INTERVENTION;  Surgeon: [name], MD;  Location: [facility] C[facility] LAB;  Service: Cardiovascular;;  . Left heart catheterization with coronary/graft angiogram N/A  02/14/2012    Procedure: LEFT HEART CATHETERIZATION WITH Beatrix Fetters;  Surgeon: Peter M Martinique, MD;  Location: [facility] CATH LAB;  Service: Cardiovascular;  Laterality: N/A;  . Left and right heart catheterization with coronary/graft angiogram N/A 09/23/2014    Procedure: LEFT AND RIGHT HEART CATHETERIZATION WITH Beatrix Fetters;  Surgeon: Blane Ohara, MD;  Location: [facility] CATH LAB;  Service: Cardiovascular;  Laterality: N/A;  . Percutaneous coronary stent intervention (pci-s)  10/14/2014    Procedure: PERCUTANEOUS CORONARY STENT INTERVENTION (PCI-S);  Surgeon: Blane Ohara, MD;  Location: [facility] CATH LAB;  Service: Cardiovascular;;    Current Outpatient Prescriptions  Medication Sig Dispense Refill  . aspirin EC 81 MG tablet Take 81 mg by mouth every morning.     . clopidogrel (PLAVIX) 75 MG tablet Take 1 tablet (75 mg total) by mouth daily. 90 tablet 3  . gabapentin (NEURONTIN) 300  MG capsule Take 300 mg by mouth 2 (two) times daily.     . isosorbide mononitrate (IMDUR) 30 MG 24 hr tablet TAKE ONE TABLET BY MOUTH ONCE DAILY 90 tablet 1  . metFORMIN (GLUCOPHAGE) 500 MG tablet Take 1 tablet (500 mg total) by mouth 2 (two) times daily with a meal. HOLD today, restart on 11/08/2014.    . nitroGLYCERIN (NITROSTAT) 0.4 MG SL tablet Place 0.4 mg under the tongue every 5 (five) minutes as needed for chest pain.    . pravastatin (PRAVACHOL) 40 MG tablet Take 40 mg by mouth daily.    . vitamin B-12 (CYANOCOBALAMIN) 500 MCG tablet Take 1,000 mcg by mouth daily.      No current facility-administered medications for this visit.    Allergies:   Losartan; Azithromycin; Contrast media; Doxycycline; Keflex; Macrodantin; Minocycline hcl; Nitrofurantoin; and Zithromax   Social History:  The patient  reports that he quit smoking about 30 years ago. His smoking use included Cigarettes and Pipe. He has a 30 pack-year smoking history. He has quit using smokeless tobacco. His smokeless tobacco use  included Chew. He reports that he does not drink alcohol or use illicit drugs.   Family History:  The patient's  family history includes Other in his father and mother.    ROS:  Please see the history of present illness.  Otherwise, review of systems is positive for DOE, rash, gait instability.  All other systems are reviewed and negative.    PHYSICAL EXAM: VS:  BP 118/64 mmHg  Pulse 66  Ht 6' (1.829 m)  Wt 187 lb (84.823 kg)  BMI 25.36 kg/m2 , BMI Body mass index is 25.36 kg/(m^2). GEN: Well nourished, well developed, in no acute distress HEENT: normal Neck: no JVD, no masses. No carotid bruits Cardiac: RRR with 2/6 SEM at the RUSB             Respiratory:  clear to auscultation bilaterally, normal work of breathing GI: soft, nontender, nondistended, + BS MS: no deformity or atrophy Ext: trace pretibial edema, pedal pulses 2+= bilaterally Skin: warm and dry, no rash Neuro:  Strength and sensation are intact Psych: euthymic mood, full affect  EKG:  EKG is ordered today. The ekg ordered today shows Sinus rhythm 66 bpm, nonspecific IVCD, cannot rule out septal infarct age-undetermined  Recent Labs: 11/22/2014: BUN 15; Creatinine, Ser 0.99; Hemoglobin 10.4*; Platelets 64*; Potassium 4.3; Sodium 142   Lipid Panel     Component Value Date/Time   CHOL 111 02/02/2012 0910   TRIG 76 02/02/2012 0910   HDL 24* 02/02/2012 0910   CHOLHDL 4.6 02/02/2012 0910   VLDL 15 02/02/2012 0910   LDLCALC 72 02/02/2012 0910   LDLDIRECT 66.0 02/22/2008 0928     Wt Readings from Last 3 Encounters:  11/21/15 187 lb (84.823 kg)  05/19/15 187 lb (84.823 kg)  12/31/14 179 lb 6.4 oz (81.375 kg)     Cardiac Studies Reviewed: 2D Echo (today): Study Conclusions  - Left ventricle: The cavity size was mildly dilated. Wall thickness was normal. Systolic function was normal. The estimated ejection fraction was in the range of 50% to 55%. There is hypokinesis of the inferolateral myocardium.  Doppler parameters are consistent with abnormal left ventricular relaxation (grade 1 diastolic dysfunction). Doppler parameters are consistent with high ventricular filling pressure. - Aortic valve: A bioprosthesis was present. - Mitral valve: Severely calcified annulus. Mildly thickened leaflets . There was mild regurgitation. - Left atrium: The atrium was moderately to severely  dilated. - Right ventricle: The cavity size was moderately dilated. - Right atrium: The atrium was moderately dilated. - Tricuspid valve: There was moderate regurgitation. - Pulmonary arteries: Systolic pressure was mildly increased.  Impressions:  - Inferior lateral hypokinesis with overall low normal LV function; grade 1 diastolic dysfunction with elevated LV filling pressure; s/p TAVR with mean gradient of 12 mmHg and no AI; biatrial enlargement; moderate RVE; mild MR and TR; mildly elevated pulmonary pressure; compared to 12/31/14, AVR continues to function normally.  ASSESSMENT AND PLAN: 1.  Severe aortic valve stenosis status post TAVR. Echo reviewed and shows  Normal transcatheter heart valve function. There is no paravalvular aortic insufficiency noted. The patient has New York Heart Association functional class I symptoms. He will return for follow-up in 6 months.  2. Coronary artery disease status post CABG and recent vein graft PCI. He has no anginal symptoms at present. His medications will be continued without change.  I think as long as he is tolerating aspirin and Plavix, this should be continued as he has undergone stent in stent treatment of a saphenous vein bypass graft.  3. Essential hypertension: Well-controlled  On current medical therapy  4. Hyperlipidemia. He continues on pravastatin.  Current medicines are reviewed with the patient today.  The patient does not have concerns regarding medicines.  Labs/ tests ordered today include:  No orders of the defined types were  placed in this encounter.   Disposition:   FU 6 months  Signed, [name], MD  11/21/2015 10:14 AM    [facility] [address], [place], [place]  [address] Phone: [phone]; Fax: [phone]

## 2015-11-21 NOTE — Patient Instructions (Signed)

## 2016-01-07 ENCOUNTER — Other Ambulatory Visit: Payer: Self-pay | Admitting: Cardiovascular Disease

## 2016-01-26 ENCOUNTER — Other Ambulatory Visit: Payer: Self-pay | Admitting: Cardiovascular Disease

## 2016-01-26 NOTE — Telephone Encounter (Signed)
REFILL 

## 2016-03-12 ENCOUNTER — Inpatient Hospital Stay (HOSPITAL_COMMUNITY): Payer: Medicare Other

## 2016-03-12 ENCOUNTER — Emergency Department (HOSPITAL_COMMUNITY): Payer: Medicare Other

## 2016-03-12 ENCOUNTER — Inpatient Hospital Stay (HOSPITAL_COMMUNITY)
Admission: EM | Admit: 2016-03-12 | Discharge: 2016-03-15 | DRG: 190 | Disposition: A | Discharge status: Home Health | Payer: Medicare Other | Attending: Internal Medicine | Admitting: Internal Medicine

## 2016-03-12 ENCOUNTER — Encounter (HOSPITAL_COMMUNITY): Payer: Self-pay | Admitting: Emergency Medicine

## 2016-03-12 DIAGNOSIS — E1151 Type 2 diabetes mellitus with diabetic peripheral angiopathy without gangrene: Secondary | ICD-10-CM | POA: Diagnosis present

## 2016-03-12 DIAGNOSIS — R1013 Epigastric pain: Secondary | ICD-10-CM | POA: Diagnosis present

## 2016-03-12 DIAGNOSIS — I82513 Chronic embolism and thrombosis of femoral vein, bilateral: Secondary | ICD-10-CM | POA: Diagnosis present

## 2016-03-12 DIAGNOSIS — R161 Splenomegaly, not elsewhere classified: Secondary | ICD-10-CM | POA: Diagnosis present

## 2016-03-12 DIAGNOSIS — Z888 Allergy status to other drugs, medicaments and biological substances status: Secondary | ICD-10-CM

## 2016-03-12 DIAGNOSIS — D649 Anemia, unspecified: Secondary | ICD-10-CM | POA: Diagnosis present

## 2016-03-12 DIAGNOSIS — N400 Enlarged prostate without lower urinary tract symptoms: Secondary | ICD-10-CM | POA: Diagnosis present

## 2016-03-12 DIAGNOSIS — Z951 Presence of aortocoronary bypass graft: Secondary | ICD-10-CM

## 2016-03-12 DIAGNOSIS — I739 Peripheral vascular disease, unspecified: Secondary | ICD-10-CM | POA: Diagnosis present

## 2016-03-12 DIAGNOSIS — Z952 Presence of prosthetic heart valve: Secondary | ICD-10-CM | POA: Diagnosis not present

## 2016-03-12 DIAGNOSIS — E1165 Type 2 diabetes mellitus with hyperglycemia: Secondary | ICD-10-CM

## 2016-03-12 DIAGNOSIS — Z9861 Coronary angioplasty status: Secondary | ICD-10-CM | POA: Diagnosis not present

## 2016-03-12 DIAGNOSIS — E785 Hyperlipidemia, unspecified: Secondary | ICD-10-CM | POA: Diagnosis present

## 2016-03-12 DIAGNOSIS — Z9221 Personal history of antineoplastic chemotherapy: Secondary | ICD-10-CM

## 2016-03-12 DIAGNOSIS — Z8701 Personal history of pneumonia (recurrent): Secondary | ICD-10-CM

## 2016-03-12 DIAGNOSIS — I252 Old myocardial infarction: Secondary | ICD-10-CM

## 2016-03-12 DIAGNOSIS — I10 Essential (primary) hypertension: Secondary | ICD-10-CM | POA: Diagnosis present

## 2016-03-12 DIAGNOSIS — Z91041 Radiographic dye allergy status: Secondary | ICD-10-CM | POA: Diagnosis not present

## 2016-03-12 DIAGNOSIS — E1159 Type 2 diabetes mellitus with other circulatory complications: Secondary | ICD-10-CM | POA: Diagnosis present

## 2016-03-12 DIAGNOSIS — I779 Disorder of arteries and arterioles, unspecified: Secondary | ICD-10-CM | POA: Diagnosis present

## 2016-03-12 DIAGNOSIS — I493 Ventricular premature depolarization: Secondary | ICD-10-CM | POA: Diagnosis present

## 2016-03-12 DIAGNOSIS — Z8249 Family history of ischemic heart disease and other diseases of the circulatory system: Secondary | ICD-10-CM

## 2016-03-12 DIAGNOSIS — Z7984 Long term (current) use of oral hypoglycemic drugs: Secondary | ICD-10-CM | POA: Diagnosis not present

## 2016-03-12 DIAGNOSIS — I82509 Chronic embolism and thrombosis of unspecified deep veins of unspecified lower extremity: Secondary | ICD-10-CM | POA: Diagnosis present

## 2016-03-12 DIAGNOSIS — Z79899 Other long term (current) drug therapy: Secondary | ICD-10-CM | POA: Diagnosis not present

## 2016-03-12 DIAGNOSIS — J440 Chronic obstructive pulmonary disease with acute lower respiratory infection: Principal | ICD-10-CM | POA: Diagnosis present

## 2016-03-12 DIAGNOSIS — C8310 Mantle cell lymphoma, unspecified site: Secondary | ICD-10-CM | POA: Diagnosis present

## 2016-03-12 DIAGNOSIS — I82532 Chronic embolism and thrombosis of left popliteal vein: Secondary | ICD-10-CM | POA: Diagnosis present

## 2016-03-12 DIAGNOSIS — Z87891 Personal history of nicotine dependence: Secondary | ICD-10-CM

## 2016-03-12 DIAGNOSIS — J189 Pneumonia, unspecified organism: Secondary | ICD-10-CM | POA: Diagnosis present

## 2016-03-12 DIAGNOSIS — Z881 Allergy status to other antibiotic agents status: Secondary | ICD-10-CM | POA: Diagnosis not present

## 2016-03-12 DIAGNOSIS — D696 Thrombocytopenia, unspecified: Secondary | ICD-10-CM | POA: Diagnosis present

## 2016-03-12 DIAGNOSIS — Z954 Presence of other heart-valve replacement: Secondary | ICD-10-CM

## 2016-03-12 DIAGNOSIS — E440 Moderate protein-calorie malnutrition: Secondary | ICD-10-CM | POA: Diagnosis present

## 2016-03-12 DIAGNOSIS — J45909 Unspecified asthma, uncomplicated: Secondary | ICD-10-CM | POA: Diagnosis present

## 2016-03-12 DIAGNOSIS — I2510 Atherosclerotic heart disease of native coronary artery without angina pectoris: Secondary | ICD-10-CM | POA: Diagnosis present

## 2016-03-12 DIAGNOSIS — Z6823 Body mass index (BMI) 23.0-23.9, adult: Secondary | ICD-10-CM | POA: Diagnosis not present

## 2016-03-12 DIAGNOSIS — Z7982 Long term (current) use of aspirin: Secondary | ICD-10-CM

## 2016-03-12 LAB — LACTIC ACID, PLASMA: Lactic Acid, Venous: 1.2 mmol/L (ref 0.5–2.0)

## 2016-03-12 LAB — URINALYSIS, ROUTINE W REFLEX MICROSCOPIC
BILIRUBIN URINE: NEGATIVE
GLUCOSE, UA: NEGATIVE mg/dL
Hgb urine dipstick: NEGATIVE
KETONES UR: NEGATIVE mg/dL
Leukocytes, UA: NEGATIVE
NITRITE: NEGATIVE
PH: 5.5 (ref 5.0–8.0)
Protein, ur: 30 mg/dL — AB
Specific Gravity, Urine: 1.012 (ref 1.005–1.030)

## 2016-03-12 LAB — I-STAT TROPONIN, ED: Troponin i, poc: 0.05 ng/mL (ref 0.00–0.08)

## 2016-03-12 LAB — CBC
HEMATOCRIT: 29.1 % — AB (ref 39.0–52.0)
Hemoglobin: 9.6 g/dL — ABNORMAL LOW (ref 13.0–17.0)
MCH: 27.6 pg (ref 26.0–34.0)
MCHC: 33 g/dL (ref 30.0–36.0)
MCV: 83.6 fL (ref 78.0–100.0)
Platelets: 119 10*3/uL — ABNORMAL LOW (ref 150–400)
RBC: 3.48 MIL/uL — ABNORMAL LOW (ref 4.22–5.81)
RDW: 16.7 % — AB (ref 11.5–15.5)
WBC: 9.5 10*3/uL (ref 4.0–10.5)

## 2016-03-12 LAB — APTT: aPTT: 38 seconds — ABNORMAL HIGH (ref 24–37)

## 2016-03-12 LAB — URINE MICROSCOPIC-ADD ON
Bacteria, UA: NONE SEEN
WBC UA: NONE SEEN WBC/hpf (ref 0–5)

## 2016-03-12 LAB — BASIC METABOLIC PANEL
Anion gap: 10 (ref 5–15)
BUN: 23 mg/dL — AB (ref 6–20)
CO2: 22 mmol/L (ref 22–32)
CREATININE: 0.84 mg/dL (ref 0.61–1.24)
Calcium: 9.4 mg/dL (ref 8.9–10.3)
Chloride: 100 mmol/L — ABNORMAL LOW (ref 101–111)
GFR calc Af Amer: >60 mL/min (ref >60)
GLUCOSE: 134 mg/dL — AB (ref 65–99)
POTASSIUM: 3.8 mmol/L (ref 3.5–5.1)
Sodium: 132 mmol/L — ABNORMAL LOW (ref 135–145)

## 2016-03-12 LAB — CBG MONITORING, ED: Glucose-Capillary: 121 mg/dL — ABNORMAL HIGH (ref 65–99)

## 2016-03-12 LAB — LIPASE, BLOOD: LIPASE: 25 U/L (ref 11–51)

## 2016-03-12 LAB — PROCALCITONIN: PROCALCITONIN: 0.14 ng/mL

## 2016-03-12 LAB — BRAIN NATRIURETIC PEPTIDE: B Natriuretic Peptide: 2301.3 pg/mL — ABNORMAL HIGH (ref 0.0–100.0)

## 2016-03-12 LAB — PROTIME-INR
INR: 1.34 (ref 0.00–1.49)
PROTHROMBIN TIME: 16.2 s — AB (ref 11.6–15.2)

## 2016-03-12 MED ORDER — VITAMIN B-12 1000 MCG PO TABS
1000.0000 ug | ORAL_TABLET | Freq: Every day | ORAL | Status: DC
  Administered 2016-03-13 – 2016-03-15 (×3): 1000 ug via ORAL
  Filled 2016-03-12 (×3): qty 1

## 2016-03-12 MED ORDER — ASPIRIN EC 81 MG PO TBEC
81.0000 mg | DELAYED_RELEASE_TABLET | Freq: Every day | ORAL | Status: DC
  Administered 2016-03-13 – 2016-03-15 (×3): 81 mg via ORAL
  Filled 2016-03-12 (×3): qty 1

## 2016-03-12 MED ORDER — TRAMADOL HCL 50 MG PO TABS
50.0000 mg | ORAL_TABLET | Freq: Two times a day (BID) | ORAL | Status: DC
  Administered 2016-03-12 – 2016-03-15 (×6): 50 mg via ORAL
  Filled 2016-03-12 (×6): qty 1

## 2016-03-12 MED ORDER — ISOSORBIDE MONONITRATE ER 30 MG PO TB24
30.0000 mg | ORAL_TABLET | Freq: Every day | ORAL | Status: DC
  Administered 2016-03-13 – 2016-03-15 (×3): 30 mg via ORAL
  Filled 2016-03-12 (×3): qty 1

## 2016-03-12 MED ORDER — INSULIN ASPART 100 UNIT/ML ~~LOC~~ SOLN
0.0000 [IU] | Freq: Three times a day (TID) | SUBCUTANEOUS | Status: DC
  Administered 2016-03-13: 2 [IU] via SUBCUTANEOUS
  Administered 2016-03-13 – 2016-03-15 (×4): 1 [IU] via SUBCUTANEOUS

## 2016-03-12 MED ORDER — LEVOFLOXACIN IN D5W 750 MG/150ML IV SOLN
750.0000 mg | INTRAVENOUS | Status: DC
  Administered 2016-03-13 – 2016-03-14 (×2): 750 mg via INTRAVENOUS
  Filled 2016-03-12 (×2): qty 150

## 2016-03-12 MED ORDER — MORPHINE SULFATE (PF) 2 MG/ML IV SOLN
1.0000 mg | INTRAVENOUS | Status: DC | PRN
  Administered 2016-03-13: 1 mg via INTRAVENOUS
  Filled 2016-03-12: qty 1

## 2016-03-12 MED ORDER — METRONIDAZOLE 500 MG PO TABS
500.0000 mg | ORAL_TABLET | Freq: Three times a day (TID) | ORAL | Status: DC
  Administered 2016-03-13 (×3): 500 mg via ORAL
  Filled 2016-03-12 (×3): qty 1

## 2016-03-12 MED ORDER — LEVOFLOXACIN IN D5W 750 MG/150ML IV SOLN: 750.0000 mg | INTRAVENOUS | Status: DC

## 2016-03-12 MED ORDER — ONDANSETRON HCL 4 MG/2ML IJ SOLN: 4.0000 mg | Freq: Three times a day (TID) | INTRAMUSCULAR | Status: DC | PRN

## 2016-03-12 MED ORDER — DM-GUAIFENESIN ER 30-600 MG PO TB12
1.0000 | ORAL_TABLET | Freq: Two times a day (BID) | ORAL | Status: DC
  Administered 2016-03-12 – 2016-03-15 (×6): 1 via ORAL
  Filled 2016-03-12 (×6): qty 1

## 2016-03-12 MED ORDER — SODIUM CHLORIDE 0.9 % IV SOLN
INTRAVENOUS | Status: DC
  Administered 2016-03-12: 23:00:00 via INTRAVENOUS

## 2016-03-12 MED ORDER — SODIUM CHLORIDE 0.9 % IV BOLUS (SEPSIS)
500.0000 mL | Freq: Once | INTRAVENOUS | Status: AC
  Administered 2016-03-12: 500 mL via INTRAVENOUS

## 2016-03-12 MED ORDER — SODIUM CHLORIDE 0.9 % IV BOLUS (SEPSIS)
1000.0000 mL | Freq: Once | INTRAVENOUS | Status: AC
  Administered 2016-03-12: 1000 mL via INTRAVENOUS

## 2016-03-12 MED ORDER — INSULIN ASPART 100 UNIT/ML ~~LOC~~ SOLN: 0.0000 [IU] | Freq: Every day | SUBCUTANEOUS | Status: DC

## 2016-03-12 MED ORDER — NITROGLYCERIN 0.4 MG SL SUBL: 0.4000 mg | SUBLINGUAL_TABLET | SUBLINGUAL | Status: DC | PRN

## 2016-03-12 MED ORDER — PRAVASTATIN SODIUM 40 MG PO TABS
40.0000 mg | ORAL_TABLET | Freq: Every day | ORAL | Status: DC
  Administered 2016-03-13 – 2016-03-15 (×3): 40 mg via ORAL
  Filled 2016-03-12 (×3): qty 1

## 2016-03-12 MED ORDER — GABAPENTIN 300 MG PO CAPS
300.0000 mg | ORAL_CAPSULE | Freq: Two times a day (BID) | ORAL | Status: DC
  Administered 2016-03-12 – 2016-03-15 (×6): 300 mg via ORAL
  Filled 2016-03-12 (×6): qty 1

## 2016-03-12 MED ORDER — ENOXAPARIN SODIUM 40 MG/0.4ML ~~LOC~~ SOLN
40.0000 mg | SUBCUTANEOUS | Status: DC
  Administered 2016-03-12 – 2016-03-14 (×3): 40 mg via SUBCUTANEOUS
  Filled 2016-03-12 (×4): qty 0.4

## 2016-03-12 MED ORDER — PANTOPRAZOLE SODIUM 40 MG PO TBEC
40.0000 mg | DELAYED_RELEASE_TABLET | Freq: Every day | ORAL | Status: DC
  Administered 2016-03-12 – 2016-03-14 (×3): 40 mg via ORAL
  Filled 2016-03-12 (×3): qty 1

## 2016-03-12 MED ORDER — LEVOFLOXACIN IN D5W 750 MG/150ML IV SOLN
750.0000 mg | Freq: Once | INTRAVENOUS | Status: AC
  Administered 2016-03-12: 750 mg via INTRAVENOUS
  Filled 2016-03-12: qty 150

## 2016-03-12 MED ORDER — CLOPIDOGREL BISULFATE 75 MG PO TABS
75.0000 mg | ORAL_TABLET | Freq: Every day | ORAL | Status: DC
  Administered 2016-03-13 – 2016-03-15 (×3): 75 mg via ORAL
  Filled 2016-03-12 (×3): qty 1

## 2016-03-12 MED ORDER — HYDROXYZINE HCL 25 MG PO TABS: 25.0000 mg | ORAL_TABLET | Freq: Four times a day (QID) | ORAL | Status: DC | PRN

## 2016-03-12 MED ORDER — IPRATROPIUM-ALBUTEROL 0.5-2.5 (3) MG/3ML IN SOLN
3.0000 mL | RESPIRATORY_TRACT | Status: DC | PRN
  Administered 2016-03-14: 3 mL via RESPIRATORY_TRACT
  Filled 2016-03-12: qty 3

## 2016-03-12 NOTE — ED Provider Notes (Signed)
CSN: [id number]     Arrival date & time 03/12/16  1735 History   First MD Initiated Contact with Patient 03/12/16 1803     Chief Complaint  Patient presents with  . Cough  . Weakness  . Fever     (Consider location/radiation/quality/duration/timing/severity/associated sxs/prior Treatment) HPI Comments: Patient is an 82 year old male with history of coronary artery disease with history of CABG in 1995 with subsequent stenting, hypertension, aortic stenosis, and dementia. He presents for a 2 week history of weakness, chest congestion, and productive cough. He has also been having low-grade fevers. According to the wife, he is weaker and less steady on his feet. He was evaluated earlier today by his primary Dr. and sent here for further evaluation and possible admission.  Patient is a 82 y.o. male presenting with cough. The history is provided by the patient.  Cough Cough characteristics:  Productive Sputum characteristics:  Yellow Severity:  Moderate Onset quality:  Gradual Duration:  2 weeks Timing:  Constant Progression:  Worsening Chronicity:  New Smoker: no   Relieved by:  Nothing Worsened by:  Nothing tried Ineffective treatments:  None tried Associated symptoms: chills and fever     Past Medical History  Diagnosis Date  . CAD (coronary artery disease)     a. s/p CABG 1995;  b. NSTEMI & subsequent BMS to SVG->right PDA 02/03/12.;  c. Cath 02/14/2012 3vd with 4/4 patent grafts and patent stent in vg->pda;  d. 10/2014 PCI/DES to the VG->PDA.  . Carotid stenosis     a. Carotid dopplers [id number]: RICA 123456, LICA [id number];  b. dopplers 3/14:  123456 RICA, [id number] LICA  . HTN (hypertension)   . HLD (hyperlipidemia)   . Asthmatic bronchitis   . BPH (benign prostatic hypertrophy)   . Herpes zoster   . Anemia   . Thrombocytopenia ([place])     a. secondary to splenomegaly related to lymphoma with suspicion of bone marrow involvement. (Plavix had to be stopped due to this)  . Aortic stenosis,  severe     a. ECHO 07/22/14 Severe AS. Peak and mean gradients of 57 mmHg and 42 mmHg, respectively. Calculated AVA is 0.8-0.9 cm2. Trivial AR. Valve area (VTI): 0.84 cm^2. Valve area (Vmax): 0.86 cm^2.. Aortic root dimension: 42 mm (ED) and aortic root mildly dilated;  b. 10/2014 S/P TAVR w/ Edwards Sapien 3 THV 47mm;  c. 10/2014 Echo: EF 50%, nl fxning AoV, mean grad of 9, peak of 16.  Marland Kitchen Splenomegaly   . COPD (chronic obstructive pulmonary disease) ([place])   . Peripheral vascular disease ([place])     a. s/p L ray amp 10/13;  b. s/p L 2nd toe amp 12/13  . Pulmonary nodule     a. 98mm RUL calcified pulm nodule noted on CT staging for lymphoma - stable 10/2012.  . Leg DVT (deep venous thromboembolism), chronic ([place])     a. LE dopplers 5/13, 10/13 and 1/14: chronic DVT involving right mid femoral vein, left mid femoral vein, and left popliteal vein.  . Osteomyelitis ([place])     a. L first foot ray amputation 09/2012  . Heart murmur   . Anginal pain ([place])   . Myocardial infarction [facility]) ?1995  . Pneumonia ?2014  . DM2 (diabetes mellitus, type 2) ([place])   . DJD (degenerative joint disease)   . 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 Stage III s/p bendamustine, Rituxan therapy  . 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 29 mm Edwards Sapien 3 transcatheter heart valve placed via open right transfemoral approach   Past Surgical History  Procedure Laterality Date  . Coronary artery bypass graft  1995  . Shoulder arthroscopy w/ rotator cuff repair Right   . Carpal tunnel release Bilateral   . Incisional hernia repair    . Kidney surgery      "cut me 1/2 in 2 for stones"  . Back surgery    . I&d extremity  09/16/2012    Procedure: IRRIGATION AND DEBRIDEMENT EXTREMITY;  Surgeon: Wylene Simmer, MD;  [facility];  Service: Orthopedics;  Laterality: Left;  irrigation and debridement and first Ray amputation of left foot  . Amputation  09/16/2012     Procedure: AMPUTATION RAY;  Surgeon: Wylene Simmer, MD;  [facility];  Service: Orthopedics;  Laterality: Left;  first Ray amputation left foot  . Amputation  11/21/2012    Procedure: AMPUTATION RAY;  Surgeon: Wylene Simmer, MD;  Location: [place];  Service: Orthopedics;  Laterality: Left;  LEFT SECOND TOE AMPUTATION  . I&d extremity Left 03/01/2013    Procedure: IRRIGATION AND DEBRIDEMENT EXTREMITY;  Surgeon: Wylene Simmer, MD;  [facility];  Service: Orthopedics;  Laterality: Left;  IRRIGATION  AND  DEBRIDEMENT  LEFT  FOOT  . Amputation Left 12/20/2013    Procedure: AMPUTATION LEFT 5TH TRANSMETATARSAL;  Surgeon: Wylene Simmer, MD;  Location: [place];  Service: Orthopedics;  Laterality: Left;  . Achilles tendon lengthening Left 12/20/2013    Procedure: ACHILLES TENDON LENGTHENING;  Surgeon: Wylene Simmer, MD;  Location: [place];  Service: Orthopedics;  Laterality: Left;  . Hernia repair    . Coronary angioplasty with stent placement  09/2014; 10/14/2014    "?2; 1"  . Cataract extraction w/ intraocular lens  implant, bilateral Bilateral   . Transcatheter aortic valve replacement, transfemoral N/A 11/05/2014    Procedure: TRANSCATHETER AORTIC VALVE REPLACEMENT, TRANSFEMORAL;  Surgeon: [name], MD;  [facility]; Berniece Pap 3 THV (size 29 mm, model # F048547, serial # [id number])   . Intraoperative transesophageal echocardiogram N/A 11/05/2014    Procedure: INTRAOPERATIVE TRANSESOPHAGEAL ECHOCARDIOGRAM;  Surgeon: [name], MD;  Location: [facility] OR;  Service: Open Heart Surgery;  Laterality: N/A;  . Left and right heart catheterization with coronary/graft angiogram N/A 02/03/2012    Procedure: LEFT AND RIGHT HEART CATHETERIZATION WITH Beatrix Fetters;  Surgeon: [name], MD;  Location: [facility] CATH LAB;  Service: Cardiovascular;  Laterality: N/A;  . Percutaneous stent intervention  02/03/2012    Procedure: PERCUTANEOUS STENT INTERVENTION;  Surgeon: [name], MD;  Location: [facility] CATH  LAB;  Service: Cardiovascular;;  . Left heart catheterization with coronary/graft angiogram N/A 02/14/2012    Procedure: LEFT HEART CATHETERIZATION WITH Beatrix Fetters;  Surgeon: Peter M Martinique, MD;  Location: [facility] CATH LAB;  Service: Cardiovascular;  Laterality: N/A;  . Left and right heart catheterization with coronary/graft angiogram N/A 09/23/2014    Procedure: LEFT AND RIGHT HEART CATHETERIZATION WITH Beatrix Fetters;  Surgeon: Blane Ohara, MD;  Location: [facility] CATH LAB;  Service: Cardiovascular;  Laterality: N/A;  . Percutaneous coronary stent intervention (pci-s)  10/14/2014    Procedure: PERCUTANEOUS CORONARY STENT INTERVENTION (PCI-S);  Surgeon: Blane Ohara, MD;  Location: [facility] CATH LAB;  Service: Cardiovascular;;   Family History  Problem Relation Age of Onset  . Coronary artery disease    . Alcohol abuse    . Other Mother   . Other Father    Social History  Substance  Use Topics  . Smoking status: Former Smoker -- 1.00 packs/day for 30 years    Types: Cigarettes, Pipe    Quit date: 03/29/1985  . Smokeless tobacco: Former Systems developer    Types: Chew     Comment: "chewed for ~ 1 yr after I quit smoking"  . Alcohol Use: No    Review of Systems  Constitutional: Positive for fever and chills.  Respiratory: Positive for cough.   All other systems reviewed and are negative.     Allergies  Losartan; Azithromycin; Contrast media; Doxycycline; Keflex; Macrodantin; Minocycline hcl; Nitrofurantoin; and Zithromax  Home Medications   Prior to Admission medications   Medication Sig Start Date End Date Taking? Authorizing Provider  aspirin EC 81 MG tablet Take 81 mg by mouth every morning.     Historical Provider, MD  clopidogrel (PLAVIX) 75 MG tablet TAKE ONE TABLET BY MOUTH ONCE DAILY 01/07/16   [name], MD  gabapentin (NEURONTIN) 300 MG capsule Take 300 mg by mouth 2 (two) times daily.  03/03/13   Haywood Pao, MD  isosorbide mononitrate (IMDUR) 30 MG 24  hr tablet TAKE ONE TABLET BY MOUTH ONCE DAILY 01/26/16   [name], MD  metFORMIN (GLUCOPHAGE) 500 MG tablet Take 500 mg by mouth 2 (two) times daily with a meal.    Historical Provider, MD  nitroGLYCERIN (NITROSTAT) 0.4 MG SL tablet Place 1 tablet (0.4 mg total) under the tongue every 5 (five) minutes as needed for chest pain. 11/21/15   [name], MD  pravastatin (PRAVACHOL) 40 MG tablet Take 40 mg by mouth daily.    Historical Provider, MD  vitamin B-12 (CYANOCOBALAMIN) 500 MCG tablet Take 1,000 mcg by mouth daily.     Historical Provider, MD   BP 133/68 mmHg  Pulse 75  Temp(Src) 98.3 F (36.8 C) (Oral)  Resp 16  Ht 6' (1.829 m)  Wt 170 lb (77.111 kg)  BMI 23.05 kg/m2  SpO2 96% Physical Exam  Constitutional: He is oriented to person, place, and time. He appears well-developed and well-nourished. No distress.  HENT:  Head: Normocephalic and atraumatic.  Neck: Normal range of motion. Neck supple.  Cardiovascular: Normal rate, regular rhythm and normal heart sounds.   No murmur heard. Pulmonary/Chest: Effort normal. No respiratory distress. He has no wheezes. He has rales.  There are slight rales in the bases bilaterally.  Abdominal: Soft. Bowel sounds are normal. He exhibits no distension. There is no tenderness.  Musculoskeletal: Normal range of motion. He exhibits no edema.  Neurological: He is alert and oriented to person, place, and time.  Skin: Skin is warm and dry. He is not diaphoretic.  Nursing note and vitals reviewed.   ED Course  Procedures (including critical care time) Labs Review Labs Reviewed  BASIC METABOLIC PANEL  CBC  URINALYSIS, ROUTINE W REFLEX MICROSCOPIC (NOT AT [facility])  [place]  CBG MONITORING, ED  I-STAT TROPOININ, ED    Imaging Review No results found. I have personally reviewed and evaluated these images and lab results as part of my medical decision-making.  ED ECG REPORT   Date: 03/12/2016  Rate: 80  Rhythm:  normal sinus rhythm with frequent pvcs  QRS Axis: normal  Intervals: normal  ST/T Wave abnormalities: normal  Conduction Disutrbances:none  Narrative Interpretation:   Old EKG Reviewed: none available  I have personally reviewed the EKG tracing and agree with the computerized printout as noted.   MDM   Final diagnoses:  None    Patient  presents with a two-week history of chest congestion, fever, productive cough. According to the wife, he has been much weaker over the last several days. He was advised primary Dr. for admission. His workup today does reveal a possible infiltrate in the left lower lobe. He will be given IV Levaquin and admitted to the hospitalist service under the care of Dr. Blaine Hamper.    Veryl Speak, MD 03/12/16 2009

## 2016-03-12 NOTE — ED Notes (Signed)
Per Dr. Blaine Hamper, it's okay for pt to have just 1 set of blood culture.

## 2016-03-12 NOTE — ED Notes (Signed)
MD at bedside. 

## 2016-03-12 NOTE — ED Notes (Signed)
Pt to have CT scan prior to pt's transfer to floor unit.

## 2016-03-12 NOTE — H&P (Addendum)
[facility] History and Physical  RUPESH JAKUBCZAK [id number] DOB: 07/04/1927 DOA: 03/12/2016  Referring physician: ED physician PCP: Donnajean Lopes, MD  Specialists:   Chief Complaint: Fever, productive cough, epigastric abdominal pain  HPI: [name] is a 82 y.o. male with PMH of hypertension, diabetes mellitus, COPD, asthma, CAD, S/P of CABG and stent placement, BPH, thrombocytopenia, aortic stenosis (s/p of TAVR), PVD, DVT, osteomyelitis, intra-abdominal mantle cell lymphoma in remission, who presents with fever, productive cough and epigastric abdominal pain.  Patient reports that he has been having cough and fever for almost 2 weeks. He has generalized weakness. He coughs up yellow card sputum. He was seen by his PCP today and had negative flu test. Patient does not have chest pain, shortness of breath, no runny nose or sore throat. He also reports that he has been having moderate intermittent epigastric abdominal pain for almost 2 weeks. No nausea, vomiting or diarrhea. No symptoms of UTI. No symptoms of acid reflux. Patient does not have unilateral weakness, rashes, hematuria, hematochezia.  In ED, patient was found to have WBC 9.5, platelet 119, negative troponin, negative urinalysis, temperature 99.7, no tachycardia, no tachypnea, sodium 132, renal function normal. Chest x-ray showed lower lobe increased opacity, left greater than right, consistent with bronchial wall inflammation/bronchitis; a component of left pneumonia is possible.  EKG: Independently reviewed.QTC 473, frequent PVC   Where does patient live?   At home  Can patient participate in ADLs?   Little  Review of Systems:   General: has fevers, chills, no changes in body weight, has poor appetite, has fatigue HEENT: no blurry vision, hearing changes or sore throat Pulm: no dyspnea, has coughing, no wheezing CV: no chest pain, no palpitations Abd: no nausea, vomiting, has abdominal pain, no diarrhea,  constipation GU: no dysuria, burning on urination, increased urinary frequency, hematuria  Ext: no leg edema Neuro: no unilateral weakness, numbness, or tingling, no vision change or hearing loss Skin: no rash MSK: No muscle spasm, no deformity, no limitation of range of movement in spin Heme: No easy bruising.  Travel history: No recent long distant travel.  Allergy:  Allergies  Allergen Reactions  . Losartan Other (See Comments)    Hyperkalemia > 7  . Azithromycin Itching  . Contrast Media [Iodinated Diagnostic Agents] Rash  . Doxycycline Itching       . Keflex [Cephalexin] Itching  . Macrodantin Itching  . Minocycline Hcl Itching  . Nitrofurantoin Itching  . Zithromax [Azithromycin Dihydrate] Itching    Past Medical History  Diagnosis Date  . CAD (coronary artery disease)     a. s/p CABG 1995;  b. NSTEMI & subsequent BMS to SVG->right PDA 02/03/12.;  c. Cath 02/14/2012 3vd with 4/4 patent grafts and patent stent in vg->pda;  d. 10/2014 PCI/DES to the VG->PDA.  . Carotid stenosis     a. Carotid dopplers [id number]: RICA [id number], LICA [id number];  b. dopplers 3/14:  123456 RICA, [id number] LICA  . HTN (hypertension)   . HLD (hyperlipidemia)   . Asthmatic bronchitis   . BPH (benign prostatic hypertrophy)   . Herpes zoster   . Anemia   . Thrombocytopenia ([place])     a. secondary to splenomegaly related to lymphoma with suspicion of bone marrow involvement. (Plavix had to be stopped due to this)  . Aortic stenosis, severe     a. ECHO 07/22/14 Severe AS. Peak and mean gradients of 57 mmHg and 42 mmHg, respectively. Calculated AVA is 0.8-0.9 cm2. Trivial AR. Valve  area (VTI): 0.84 cm^2. Valve area (Vmax): 0.86 cm^2.. Aortic root dimension: 42 mm (ED) and aortic root mildly dilated;  b. 10/2014 S/P TAVR w/ Edwards Sapien 3 THV 59mm;  c. 10/2014 Echo: EF 50%, nl fxning AoV, mean grad of 9, peak of 16.  Marland Kitchen Splenomegaly   . COPD (chronic obstructive pulmonary disease) ([place])   . Peripheral vascular  disease ([place])     a. s/p L ray amp 10/13;  b. s/p L 2nd toe amp 12/13  . Pulmonary nodule     a. 2mm RUL calcified pulm nodule noted on CT staging for lymphoma - stable 10/2012.  . Leg DVT (deep venous thromboembolism), chronic ([place])     a. LE dopplers 5/13, 10/13 and 1/14: chronic DVT involving right mid femoral vein, left mid femoral vein, and left popliteal vein.  . Osteomyelitis ([place])     a. L first foot ray amputation 09/2012  . Heart murmur   . Anginal pain ([place])   . Myocardial infarction [facility]) ?1995  . Pneumonia ?2014  . DM2 (diabetes mellitus, type 2) ([place])   . DJD (degenerative joint disease)   . 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 Stage III s/p bendamustine, Rituxan therapy  . 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 29 mm Edwards Sapien 3 transcatheter heart valve placed via open right transfemoral approach    Past Surgical History  Procedure Laterality Date  . Coronary artery bypass graft  1995  . Shoulder arthroscopy w/ rotator cuff repair Right   . Carpal tunnel release Bilateral   . Incisional hernia repair    . Kidney surgery      "cut me 1/2 in 2 for stones"  . Back surgery    . I&d extremity  09/16/2012    Procedure: IRRIGATION AND DEBRIDEMENT EXTREMITY;  Surgeon: Wylene Simmer, MD;  Location: [facility];  Service: Orthopedics;  Laterality: Left;  irrigation and debridement and first Ray amputation of left foot  . Amputation  09/16/2012    Procedure: AMPUTATION RAY;  Surgeon: Wylene Simmer, MD;  Location: [facility];  Service: Orthopedics;  Laterality: Left;  first Ray amputation left foot  . Amputation  11/21/2012    Procedure: AMPUTATION RAY;  Surgeon: Wylene Simmer, MD;  Location: [place];  Service: Orthopedics;  Laterality: Left;  LEFT SECOND TOE AMPUTATION  . I&d extremity Left 03/01/2013    Procedure: IRRIGATION AND DEBRIDEMENT EXTREMITY;  Surgeon: Wylene Simmer, MD;  Location: [facility];  Service: Orthopedics;  Laterality:  Left;  IRRIGATION  AND  DEBRIDEMENT  LEFT  FOOT  . Amputation Left 12/20/2013    Procedure: AMPUTATION LEFT 5TH TRANSMETATARSAL;  Surgeon: Wylene Simmer, MD;  Location: [place];  Service: Orthopedics;  Laterality: Left;  . Achilles tendon lengthening Left 12/20/2013    Procedure: ACHILLES TENDON LENGTHENING;  Surgeon: Wylene Simmer, MD;  Location: [place];  Service: Orthopedics;  Laterality: Left;  . Hernia repair    . Coronary angioplasty with stent placement  09/2014; 10/14/2014    "?2; 1"  . Cataract extraction w/ intraocular lens  implant, bilateral Bilateral   . Transcatheter aortic valve replacement, transfemoral N/A 11/05/2014    Procedure: TRANSCATHETER AORTIC VALVE REPLACEMENT, TRANSFEMORAL;  Surgeon: [name], MD;  Location: [facility] OR; Berniece Pap 3 THV (size 29 mm, model # [id number], serial # [id number])   . Intraoperative transesophageal echocardiogram N/A 11/05/2014    Procedure: INTRAOPERATIVE TRANSESOPHAGEAL ECHOCARDIOGRAM;  Surgeon: [name], MD;  Location: [facility];  Service: Open Heart Surgery;  Laterality: N/A;  . Left and right heart catheterization with coronary/graft angiogram N/A 02/03/2012    Procedure: LEFT AND RIGHT HEART CATHETERIZATION WITH Beatrix Fetters;  Surgeon: [name], MD;  Location: [facility] CATH LAB;  Service: Cardiovascular;  Laterality: N/A;  . Percutaneous stent intervention  02/03/2012    Procedure: PERCUTANEOUS STENT INTERVENTION;  Surgeon: [name], MD;  Location: [facility] CATH LAB;  Service: Cardiovascular;;  . Left heart catheterization with coronary/graft angiogram N/A 02/14/2012    Procedure: LEFT HEART CATHETERIZATION WITH Beatrix Fetters;  Surgeon: Peter M Martinique, MD;  Location: [facility] CATH LAB;  Service: Cardiovascular;  Laterality: N/A;  . Left and right heart catheterization with coronary/graft angiogram N/A 09/23/2014    Procedure: LEFT AND RIGHT HEART CATHETERIZATION WITH Beatrix Fetters;  Surgeon: Blane Ohara, MD;  Location:  [facility] CATH LAB;  Service: Cardiovascular;  Laterality: N/A;  . Percutaneous coronary stent intervention (pci-s)  10/14/2014    Procedure: PERCUTANEOUS CORONARY STENT INTERVENTION (PCI-S);  Surgeon: Blane Ohara, MD;  Location: [facility] CATH LAB;  Service: Cardiovascular;;    Social History:  reports that he quit smoking about 30 years ago. His smoking use included Cigarettes and Pipe. He has a 30 pack-year smoking history. He has quit using smokeless tobacco. His smokeless tobacco use included Chew. He reports that he does not drink alcohol or use illicit drugs.  Family History:  Family History  Problem Relation Age of Onset  . Coronary artery disease    . Alcohol abuse    . Other Mother   . Other Father      Prior to Admission medications   Medication Sig Start Date End Date Taking? Authorizing Provider  ANORO ELLIPTA 62.5-25 MCG/INH AEPB Inhale 1 puff into the lungs daily.  02/12/16  Yes Historical Provider, MD  aspirin EC 81 MG tablet Take 81 mg by mouth every morning.    Yes Historical Provider, MD  clopidogrel (PLAVIX) 75 MG tablet TAKE ONE TABLET BY MOUTH ONCE DAILY 01/07/16  Yes [name], MD  gabapentin (NEURONTIN) 300 MG capsule Take 300 mg by mouth 2 (two) times daily.  03/03/13  Yes Haywood Pao, MD  hydrOXYzine (ATARAX/VISTARIL) 25 MG tablet Take 25 mg by mouth every 6 (six) hours as needed for itching.   Yes Historical Provider, MD  isosorbide mononitrate (IMDUR) 30 MG 24 hr tablet TAKE ONE TABLET BY MOUTH ONCE DAILY 01/26/16  Yes [name], MD  metFORMIN (GLUCOPHAGE) 500 MG tablet Take 500 mg by mouth 2 (two) times daily with a meal.   Yes Historical Provider, MD  pravastatin (PRAVACHOL) 40 MG tablet Take 40 mg by mouth daily.   Yes Historical Provider, MD  traMADol (ULTRAM) 50 MG tablet Take 50 mg by mouth 2 (two) times daily.   Yes Historical Provider, MD  vitamin B-12 (CYANOCOBALAMIN) 500 MCG tablet Take 1,000 mcg by mouth daily.    Yes Historical Provider, MD   nitroGLYCERIN (NITROSTAT) 0.4 MG SL tablet Place 1 tablet (0.4 mg total) under the tongue every 5 (five) minutes as needed for chest pain. 11/21/15   [name], MD    Physical Exam: Filed Vitals:   03/12/16 1759 03/12/16 1927  BP: 133/68   Pulse: 75   Temp: 98.3 F (36.8 C) 99.7 F (37.6 C)  TempSrc: Oral Rectal  Resp: 16   Height: 6' (1.829 m)   Weight: 77.111 kg (170 lb)   SpO2: 96%    General: Not  in acute distress HEENT:       Eyes: PERRL, EOMI, no scleral icterus.       ENT: No discharge from the ears and nose, no pharynx injection, no tonsillar enlargement.        Neck: No JVD, no bruit, no mass felt. Heme: No neck lymph node enlargement. Cardiac: S1/S2, RRR, has 2/6 systolic murmurs, No gallops or rubs. Pulm: No rales, wheezing, rhonchi or rubs. Abd: Soft, nondistended, tenderness over epigastric area, no rebound pain, no organomegaly, BS present. Ext: No pitting leg edema bilaterally. 2+DP/PT pulse bilaterally. Musculoskeletal: No joint deformities, No joint redness or warmth, no limitation of ROM in spin. Skin: No rashes.  Neuro: Alert, oriented X3, cranial nerves II-XII grossly intact, moves all extremities normally. Psych: Patient is not psychotic, no suicidal or hemocidal ideation.  Labs on Admission:  Basic Metabolic Panel:  Recent Labs Lab 03/12/16 1853  NA 132*  K 3.8  CL 100*  CO2 22  GLUCOSE 134*  BUN 23*  CREATININE 0.84  CALCIUM 9.4   Liver Function Tests: No results for input(s): AST, ALT, ALKPHOS, BILITOT, PROT, ALBUMIN in the last 168 hours. No results for input(s): LIPASE, AMYLASE in the last 168 hours. No results for input(s): AMMONIA in the last 168 hours. CBC:  Recent Labs Lab 03/12/16 1853  WBC 9.5  HGB 9.6*  HCT 29.1*  MCV 83.6  PLT 119*   Cardiac Enzymes: No results for input(s): CKTOTAL, CKMB, CKMBINDEX, TROPONINI in the last 168 hours.  BNP (last 3 results)  Recent Labs  03/12/16 1856  BNP 2301.3*    ProBNP  (last 3 results) No results for input(s): PROBNP in the last 8760 hours.  CBG:  Recent Labs Lab 03/12/16 1912  GLUCAP 121*    Radiological Exams on Admission: Dg Chest 2 View  03/12/2016  CLINICAL DATA:  Productive cough with yellow mucus and generalized weakness x2-3 weeks. Pt states he had a hard cough about a week ago and a bulge appeared on his lower left rib cage that bruised black and blue. Hx of diabetes, CAD, HTN EXAM: CHEST  2 VIEW COMPARISON:  11/19/2014 FINDINGS: There changes from cardiac surgery and aortic valve replacement. Cardiac silhouette is mildly enlarged. No mediastinal or hilar masses or convincing adenopathy. There is some increased opacity in the lower lobes best seen on the lateral view. This is more prominent on the left. This is likely due to at least a component of acute bronchitis. Small area of bronchopneumonia in the left lower lobe is possible. Remainder of the lungs is clear. No pleural effusion or pneumothorax. Bony thorax is grossly intact. IMPRESSION: Lower lobe increased opacity, left greater than right, consistent with bronchial wall inflammation/bronchitis. A component of left pneumonia is possible. Electronically Signed   By: Lajean Manes M.D.   On: 03/12/2016 19:10    Assessment/Plan Principal Problem:   CAP (community acquired pneumonia) Active Problems:   Essential hypertension   Thrombocytopenia (HCC)   BPH (benign prostatic hyperplasia)   Hyperlipidemia   Splenomegaly   Peripheral arterial disease (HCC)   Mantle cell lymphoma (HCC)   Carotid artery disease (HCC)   Leg DVT (deep venous thromboembolism), chronic (HCC)   S/P TAVR (transcatheter aortic valve replacement)   Type 2 diabetes mellitus with vascular disease ([place])   Epigastric abdominal pain   CAP (community acquired pneumonia): Patient's productive cough, fever and chest x-ray findings are consistent with possible CAP. Patient is not septic on admission. Hemodynamically stable  currently. Pending  lactate level.  - will admit to tele bed (due to frequent PVC) - IV Levaquin - Mucinex for cough  - DuoNebs Neb prn for SOB - Urine legionella and S. pneumococcal antigen - Follow up blood culture x2, sputum culture and respiratory virus panel, plus Flu pcr - will get Procalcitonin and trend lactic acid level - IVF: 1.5L of NS bolus in ED, followed by 75 mL per hour of NS   Hx of HTN: not on meds at home. bp 133/68. -monitoring Bp closely  HLD: Last LDL was 723 on 02/02/12 -Continue home medications: Pravastatin  CAD: s/p of CABG and stent. No CP. -continue aspirin, Plavix, Imdur, pravastatin and when necessary nitroglycerin   DM-II: Last A1c 6.8 on 11/01/14, well controled. Patient is taking metformin at home -SSI -Check A1c  Epigastric abdominal pain: Etiology is not clear. No nausea, vomiting or diarrhea. Given his history of intra-abdominal Mantle cell lymphoma, will get CT-scan -check lipase -ct-abd/pelvis -prn morphine for pain and zofran nausea -try protonix  Addendum: CT-abdomen/pelvis showed mild generalized mural thickening of the colon and appendix without focal inflammatory change. A minimal degree of colitis might be present per radiologist. -add Flagyl    Hx of intra-abdominal Mantle cell lymphoma [facility]): s/p of chemotherapy. Used to be followed up by oncology, Dr. Beryle Beams, last seen was on 11/23/13. He is in remission per patient. -f/u with PCP -f/u CT-abd/pelvis as above  DVT ppx:  SQ Lovenox  Code Status: partial code ([place] with CPR, but not intubation) Family Communication:  Yes, patient's daguhter at bed side Disposition Plan: Admit to inpatient   Date of Service 03/12/2016    Ivor Costa [facility] Pager [phone]  If 7PM-7AM, please contact night-coverage [email or web address] Password [facility] 03/12/2016, 8:41 PM

## 2016-03-12 NOTE — ED Notes (Signed)
Pt reports ongoing cough, weakness, and fever for two weeks; seen by PCP; influenza negative; here for further evaluation/fluids.

## 2016-03-13 DIAGNOSIS — D696 Thrombocytopenia, unspecified: Secondary | ICD-10-CM

## 2016-03-13 DIAGNOSIS — R1013 Epigastric pain: Secondary | ICD-10-CM

## 2016-03-13 DIAGNOSIS — E785 Hyperlipidemia, unspecified: Secondary | ICD-10-CM

## 2016-03-13 DIAGNOSIS — J189 Pneumonia, unspecified organism: Secondary | ICD-10-CM

## 2016-03-13 DIAGNOSIS — I10 Essential (primary) hypertension: Secondary | ICD-10-CM

## 2016-03-13 LAB — CBC
HEMATOCRIT: 28.4 % — AB (ref 39.0–52.0)
HEMOGLOBIN: 9.4 g/dL — AB (ref 13.0–17.0)
MCH: 27.9 pg (ref 26.0–34.0)
MCHC: 33.1 g/dL (ref 30.0–36.0)
MCV: 84.3 fL (ref 78.0–100.0)
Platelets: 106 10*3/uL — ABNORMAL LOW (ref 150–400)
RBC: 3.37 MIL/uL — AB (ref 4.22–5.81)
RDW: 16.5 % — ABNORMAL HIGH (ref 11.5–15.5)
WBC: 7.9 10*3/uL (ref 4.0–10.5)

## 2016-03-13 LAB — BASIC METABOLIC PANEL
Anion gap: 9 (ref 5–15)
BUN: 21 mg/dL — AB (ref 6–20)
CHLORIDE: 104 mmol/L (ref 101–111)
CO2: 21 mmol/L — AB (ref 22–32)
Calcium: 9.2 mg/dL (ref 8.9–10.3)
Creatinine, Ser: 0.87 mg/dL (ref 0.61–1.24)
GFR calc non Af Amer: >60 mL/min (ref >60)
Glucose, Bld: 144 mg/dL — ABNORMAL HIGH (ref 65–99)
POTASSIUM: 3.8 mmol/L (ref 3.5–5.1)
SODIUM: 134 mmol/L — AB (ref 135–145)

## 2016-03-13 LAB — GLUCOSE, CAPILLARY
GLUCOSE-CAPILLARY: 124 mg/dL — AB (ref 65–99)
GLUCOSE-CAPILLARY: 170 mg/dL — AB (ref 65–99)
GLUCOSE-CAPILLARY: 99 mg/dL (ref 65–99)
GLUCOSE-CAPILLARY: 134 mg/dL — AB (ref 65–99)
Glucose-Capillary: 127 mg/dL — ABNORMAL HIGH (ref 65–99)

## 2016-03-13 LAB — LACTIC ACID, PLASMA: LACTIC ACID, VENOUS: 0.9 mmol/L (ref 0.5–2.0)

## 2016-03-13 LAB — INFLUENZA PANEL BY PCR (TYPE A & B)
H1N1FLUPCR: NOT DETECTED
INFLBPCR: NEGATIVE
Influenza A By PCR: NEGATIVE

## 2016-03-13 LAB — EXPECTORATED SPUTUM ASSESSMENT W REFEX TO RESP CULTURE

## 2016-03-13 LAB — STREP PNEUMONIAE URINARY ANTIGEN: Strep Pneumo Urinary Antigen: NEGATIVE

## 2016-03-13 LAB — EXPECTORATED SPUTUM ASSESSMENT W GRAM STAIN, RFLX TO RESP C

## 2016-03-13 MED ORDER — SODIUM CHLORIDE 0.9 % IV SOLN
INTRAVENOUS | Status: DC
  Administered 2016-03-13 – 2016-03-14 (×2): via INTRAVENOUS

## 2016-03-13 MED ORDER — ACETAMINOPHEN 325 MG PO TABS
650.0000 mg | ORAL_TABLET | Freq: Four times a day (QID) | ORAL | Status: DC | PRN
  Administered 2016-03-14: 650 mg via ORAL
  Filled 2016-03-13: qty 2

## 2016-03-13 NOTE — Progress Notes (Signed)
PROGRESS NOTE    [name]  [id number]  DOB: [birth year]  DOA: 03/12/2016 PCP: Donnajean Lopes, MD Outpatient Specialists:   Hospital course: 82 y.o. male with PMH of hypertension, diabetes mellitus, COPD, asthma, CAD, S/P of CABG and stent placement, BPH, thrombocytopenia, aortic stenosis (s/p of TAVR), PVD, DVT, osteomyelitis, intra-abdominal mantle cell lymphoma in remission, who presented to the ED with complaints of fever, productive cough and upper abdominal pain from coughing. Seen by his PCP on 4/7 and had negative flu test. Constipated at home-wife gave him 2 stool softeners and MiraLAX followed by 2 loose stools yesterday. Admitted for community-acquired pneumonia.   Assessment & Plan:   Community-acquired pneumonia - As evidenced by productive cough, fever and chest x-ray findings - Not septic on admission and hemodynamically stable. - Started empirically on IV levofloxacin, continue - Lactate normal. RSV panel pending. Pro-calcitonin 0.14. Urinary streptococcal antigen: Negative. Flu panel PCR: Negative. Urine Legionella antigen: Pending. Blood culture: Pending.  Abdominal pain - As per patient and spouse, patient had a violent coughing spell approximately 2-3 weeks ago followed by some bruising over the left upper abdomen and since then has had abdominal pain which is only present and worse on coughing and none when he is not coughing. - Likely muscular in etiology from coughing. - Although CT abdomen suggests diffuse colon thickening concerning for colitis, patient does not have any GI symptoms. Lipase normal. DC Flagyl and monitor clinically.  Essential hypertension - Controlled  Hyperlipidemia - Continue statins  CAD status post CABG and PCI - No reported chest pain. Continue aspirin, Plavix, Imdur, pravastatin  Type II DM - A1c 6.8 on 11/01/14. On metformin at home. SSI.  History of intra-abdominal mantle cell lymphoma - Status post chemotherapy  and said to be in remission. CT abdomen and pelvis without acute findings.  Anemia and thrombocytopenia - Stable. Follow CBCs.   DVT prophylaxis: Lovenox Code Status: Partial code/DO NOT INTUBATE Family Communication: Discussed with spouse at bedside on 4/8 Disposition Plan: DC home when medically stable, possibly 4/10   Consultants:  None  Procedures:  None  Antimicrobials:   Levofloxacin 4/7 >  Flagyl 4/7 > 4/8.  Subjective: Feels better. Stronger. No dyspnea. Mild cough but no further sputum. Upper abdominal pain only on coughing which is improved. Constipation at home. No nausea or vomiting.  Objective: Filed Vitals:   03/12/16 2200 03/12/16 2231 03/13/16 0403 03/13/16 1335  BP: 130/84 135/55 134/64 129/61  Pulse: 86 82 100 84  Temp:  97.6 F (36.4 C) 97.9 F (36.6 C) 98.1 F (36.7 C)  TempSrc:  Oral Oral Oral  Resp: 20 20 20 20   Height:  6' (1.829 m)    Weight:  78.9 kg (173 lb 15.1 oz)    SpO2: 97% 98% 96% 96%    Intake/Output Summary (Last 24 hours) at 03/13/16 1343 Last data filed at 03/13/16 0700  Gross per 24 hour  Intake 2117.5 ml  Output    750 ml  Net 1367.5 ml   Filed Weights   03/12/16 1759 03/12/16 2231  Weight: 77.111 kg (170 lb) 78.9 kg (173 lb 15.1 oz)    Exam:  General exam: Pleasant elderly male sitting up comfortably at edge of bed. Does not look septic or toxic. Respiratory system: Reduced breath sounds in the bases. The rest of lung fields clear to auscultation No increased work of breathing. Cardiovascular system: S1 & S2 heard, RRR. No JVD, murmurs, gallops, clicks or pedal edema. Telemetry: SR  with BBB morphology and occasional PVCs. Gastrointestinal system: Abdomen is nondistended, soft and nontender. Normal bowel sounds heard. No external bruising or ecchymosis noted. Central nervous system: Alert and oriented. No focal neurological deficits. Extremities: Symmetric 5 x 5 power.   Data Reviewed: Basic Metabolic  Panel:  Recent Labs Lab 03/12/16 1853 03/13/16 0227  NA 132* 134*  K 3.8 3.8  CL 100* 104  CO2 22 21*  GLUCOSE 134* 144*  BUN 23* 21*  CREATININE 0.84 0.87  CALCIUM 9.4 9.2   Liver Function Tests: No results for input(s): AST, ALT, ALKPHOS, BILITOT, PROT, ALBUMIN in the last 168 hours.  Recent Labs Lab 03/12/16 2114  LIPASE 25   No results for input(s): AMMONIA in the last 168 hours. CBC:  Recent Labs Lab 03/12/16 1853 03/13/16 0227  WBC 9.5 7.9  HGB 9.6* 9.4*  HCT 29.1* 28.4*  MCV 83.6 84.3  PLT 119* 106*   Cardiac Enzymes: No results for input(s): CKTOTAL, CKMB, CKMBINDEX, TROPONINI in the last 168 hours. BNP (last 3 results) No results for input(s): PROBNP in the last 8760 hours. CBG:  Recent Labs Lab 03/12/16 1912 03/12/16 2244 03/13/16 0839 03/13/16 1228  GLUCAP 121* 124* 127* 170*    No results found for this or any previous visit (from the past 240 hour(s)).       Studies: Ct Abdomen Pelvis Wo Contrast  03/12/2016  CLINICAL DATA:  Abdominal distention and pain. EXAM: CT ABDOMEN AND PELVIS WITHOUT CONTRAST TECHNIQUE: Multidetector CT imaging of the abdomen and pelvis was performed following the standard protocol without IV contrast. COMPARISON:  07/02/2013 FINDINGS: There is unchanged splenomegaly, measuring 8.2 x 13.4 x 16 cm. No focal splenic lesion is evident. There are unremarkable unenhanced appearances of the liver, gallbladder and bile ducts. There are unremarkable unenhanced appearances of the pancreas and adrenals. There are lower pole left collecting system calculi measuring 4-6 mm, unchanged. Kidneys are otherwise unremarkable. Ureters and urinary bladder are unremarkable. Stomach and small bowel are unremarkable. Oral contrast has reached the rectum. There is mild generalized mural thickening of the colon without focal inflammation. The abdominal aorta is normal in caliber. There is mild atherosclerotic calcification. There is no adenopathy  in the abdomen or pelvis. There is no ascites. There is confluent alveolar opacity with air bronchograms in the posterior right lower lobe base. This is present to a lesser degree in the left lower lobe base. This could represent pneumonia. There is a very small left pleural effusion. There is a trace right pleural effusion. IMPRESSION: 1. Mild generalized mural thickening of the colon and appendix without focal inflammatory change. A minimal degree of colitis might be present. There is no bowel obstruction. There is no extraluminal air. 2. Unchanged splenomegaly. 3. Left nephrolithiasis. 4. Consolidation in both posterior lung bases, right worse than left. This is more focally consolidated than would be typical for dependent atelectasis, and it may represent pneumonia. There is a very small left pleural effusion and a trace right pleural effusion. Electronically Signed   By: Andreas Newport M.D.   On: 03/12/2016 22:36   Dg Chest 2 View  03/12/2016  CLINICAL DATA:  Productive cough with yellow mucus and generalized weakness x2-3 weeks. Pt states he had a hard cough about a week ago and a bulge appeared on his lower left rib cage that bruised black and blue. Hx of diabetes, CAD, HTN EXAM: CHEST  2 VIEW COMPARISON:  11/19/2014 FINDINGS: There changes from cardiac surgery and aortic valve replacement.  Cardiac silhouette is mildly enlarged. No mediastinal or hilar masses or convincing adenopathy. There is some increased opacity in the lower lobes best seen on the lateral view. This is more prominent on the left. This is likely due to at least a component of acute bronchitis. Small area of bronchopneumonia in the left lower lobe is possible. Remainder of the lungs is clear. No pleural effusion or pneumothorax. Bony thorax is grossly intact. IMPRESSION: Lower lobe increased opacity, left greater than right, consistent with bronchial wall inflammation/bronchitis. A component of left pneumonia is possible.  Electronically Signed   By: Lajean Manes M.D.   On: 03/12/2016 19:10        Scheduled Meds: . aspirin EC  81 mg Oral Daily  . clopidogrel  75 mg Oral Daily  . dextromethorphan-guaiFENesin  1 tablet Oral BID  . enoxaparin (LOVENOX) injection  40 mg Subcutaneous Q24H  . gabapentin  300 mg Oral BID  . insulin aspart  0-5 Units Subcutaneous QHS  . insulin aspart  0-9 Units Subcutaneous TID WC  . isosorbide mononitrate  30 mg Oral Daily  . levofloxacin (LEVAQUIN) IV  750 mg Intravenous Q24H  . metroNIDAZOLE  500 mg Oral 3 times per day  . pantoprazole  40 mg Oral Q1200  . pravastatin  40 mg Oral Daily  . traMADol  50 mg Oral BID  . vitamin B-12  1,000 mcg Oral Daily   Continuous Infusions: . sodium chloride 75 mL/hr at 03/12/16 2246    Principal Problem:   CAP (community acquired pneumonia) Active Problems:   Essential hypertension   Thrombocytopenia (HCC)   BPH (benign prostatic hyperplasia)   Hyperlipidemia   Splenomegaly   Peripheral arterial disease (HCC)   Mantle cell lymphoma (HCC)   Carotid artery disease (HCC)   Leg DVT (deep venous thromboembolism), chronic (HCC)   S/P TAVR (transcatheter aortic valve replacement)   Type 2 diabetes mellitus with vascular disease (HCC)   Epigastric abdominal pain    Time spent: 30 minutes.    Vernell Leep, MD, FACP, FHM. [facility] Pager [phone]  If 7PM-7AM, please contact night-coverage [email or web address] Password [email or web address] 03/13/2016, 1:43 PM    LOS: 1 day

## 2016-03-13 NOTE — Progress Notes (Signed)
PT Cancellation Note  Patient Details Name: [name] MRN: [id number] DOB: [birth year]   Cancelled Treatment:     Pt reports she has just gotten back to bed after having walked to the bathroom.  He plans to continue to get up on his own troughout the day.  He would like to have a PT evaluation on Monday to check his balance. Will return for PT eval Monday    Norwood Levo 03/13/2016, 12:18 PM

## 2016-03-13 NOTE — Progress Notes (Signed)
Pharmacy Antibiotic Follow-up Note  [name] is a 82 y.o. year-old male admitted on 03/12/2016.  The patient is currently on day 2 of Levaquin & Flagyl for CAP.  Assessment/Plan: This patient's current antibiotics will be continued without adjustments.  Will discontinue protocol  Temp (24hrs), Avg:98.4 F (36.9 C), Min:97.6 F (36.4 C), Max:99.7 F (37.6 C)   Recent Labs Lab 03/12/16 1853 03/13/16 0227  WBC 9.5 7.9    Recent Labs Lab 03/12/16 1853 03/13/16 0227  CREATININE 0.84 0.87   Estimated Creatinine Clearance: 66.9 mL/min (by C-G formula based on Cr of 0.87).    Allergies  Allergen Reactions  . Losartan Other (See Comments)    Hyperkalemia > 7  . Azithromycin Itching  . Contrast Media [Iodinated Diagnostic Agents] Rash  . Doxycycline Itching       . Keflex [Cephalexin] Itching  . Macrodantin Itching  . Minocycline Hcl Itching  . Nitrofurantoin Itching  . Zithromax [Azithromycin Dihydrate] Itching    Antimicrobials this admission: 4/7 Levaquin IV >>  4/7 Flagyl po >>   Microbiology results: 4/7 BCx: sent 4/7 Resp virus panel: sent 4/8 BCx: sent  Thank you for allowing pharmacy to be a part of this patient's care.  Minda Ditto PharmD 03/13/2016 12:10 PM

## 2016-03-14 LAB — CBC
HEMATOCRIT: 29 % — AB (ref 39.0–52.0)
HEMOGLOBIN: 9.5 g/dL — AB (ref 13.0–17.0)
MCH: 27.7 pg (ref 26.0–34.0)
MCHC: 32.8 g/dL (ref 30.0–36.0)
MCV: 84.5 fL (ref 78.0–100.0)
Platelets: 112 10*3/uL — ABNORMAL LOW (ref 150–400)
RBC: 3.43 MIL/uL — ABNORMAL LOW (ref 4.22–5.81)
RDW: 16.9 % — AB (ref 11.5–15.5)
WBC: 7.8 10*3/uL (ref 4.0–10.5)

## 2016-03-14 LAB — GLUCOSE, CAPILLARY
GLUCOSE-CAPILLARY: 133 mg/dL — AB (ref 65–99)
Glucose-Capillary: 126 mg/dL — ABNORMAL HIGH (ref 65–99)
Glucose-Capillary: 111 mg/dL — ABNORMAL HIGH (ref 65–99)
Glucose-Capillary: 135 mg/dL — ABNORMAL HIGH (ref 65–99)

## 2016-03-14 LAB — BASIC METABOLIC PANEL
Anion gap: 8 (ref 5–15)
BUN: 18 mg/dL (ref 6–20)
CO2: 21 mmol/L — ABNORMAL LOW (ref 22–32)
Calcium: 9.3 mg/dL (ref 8.9–10.3)
Chloride: 106 mmol/L (ref 101–111)
Creatinine, Ser: 0.94 mg/dL (ref 0.61–1.24)
Glucose, Bld: 127 mg/dL — ABNORMAL HIGH (ref 65–99)
Potassium: 4.1 mmol/L (ref 3.5–5.1)
Sodium: 135 mmol/L (ref 135–145)

## 2016-03-14 MED ORDER — IPRATROPIUM-ALBUTEROL 0.5-2.5 (3) MG/3ML IN SOLN
3.0000 mL | Freq: Two times a day (BID) | RESPIRATORY_TRACT | Status: DC
  Administered 2016-03-15: 3 mL via RESPIRATORY_TRACT
  Filled 2016-03-14: qty 3

## 2016-03-14 MED ORDER — ENSURE ENLIVE PO LIQD
237.0000 mL | Freq: Three times a day (TID) | ORAL | Status: DC
Start: 2016-03-14 — End: 2016-03-15
  Administered 2016-03-14: 237 mL via ORAL

## 2016-03-14 NOTE — Progress Notes (Signed)
PROGRESS NOTE    [name]  [id number]  DOB: May 15, 1927  DOA: 03/12/2016 PCP: Donnajean Lopes, MD Outpatient Specialists:   Hospital course: 82 y.o. male with PMH of hypertension, diabetes mellitus, COPD, asthma, CAD, S/P of CABG and stent placement, BPH, thrombocytopenia, aortic stenosis (s/p of TAVR), PVD, DVT, osteomyelitis, intra-abdominal mantle cell lymphoma in remission, who presented to the ED with complaints of fever, productive cough and upper abdominal pain from coughing. Seen by his PCP on 4/7 and had negative flu test. Constipated at home-wife gave him 2 stool softeners and MiraLAX followed by 2 loose stools yesterday. Admitted for community-acquired pneumonia. Improving. DC home in 1-2 days.   Assessment & Plan:   Community-acquired pneumonia - As evidenced by productive cough, fever and chest x-ray findings - Not septic on admission and hemodynamically stable. - Started empirically on IV levofloxacin, continue - Lactate normal. RSV panel pending. Pro-calcitonin 0.14. Urinary streptococcal antigen: Negative. Flu panel PCR: Negative. Urine Legionella antigen: Pending. Blood culture: Pending. Sputum culture: Pending.  Abdominal pain - As per patient and spouse, patient had a violent coughing spell approximately 2-3 weeks ago followed by some bruising over the left upper abdomen and since then has had abdominal pain which is only present and worse on coughing and none when he is not coughing. - Likely muscular in etiology from coughing. - Although CT abdomen suggests diffuse colon thickening concerning for colitis, patient does not have any GI symptoms. Lipase normal. DC Flagyl and monitor clinically. - No further abdominal pain or diarrhea reported.  Essential hypertension - Controlled  Hyperlipidemia - Continue statins  CAD status post CABG and PCI - No reported chest pain. Continue aspirin, Plavix, Imdur, pravastatin  Type II DM - A1c 6.8 on 11/01/14.  On metformin at home. SSI.  History of intra-abdominal mantle cell lymphoma - Status post chemotherapy and said to be in remission. CT abdomen and pelvis as below.  Anemia and thrombocytopenia - Stable.  Chronic.  DVT prophylaxis: Lovenox Code Status: Partial code/DO NOT INTUBATE Family Communication: None at bedside. Disposition Plan: DC home when medically stable, possibly 4/10   Consultants:  None  Procedures:  None  Antimicrobials:   Levofloxacin 4/7 >  Flagyl 4/7 > 4/8.  Subjective: Continues to feel better. Had a coughing spell this morning. Denies abdominal pain or BM since admission. As per RN, no acute issues.  Objective: Filed Vitals:   03/13/16 1335 03/13/16 1950 03/14/16 0524 03/14/16 1437  BP: 129/61 139/80 148/53 127/48  Pulse: 84 94 87 72  Temp: 98.1 F (36.7 C) 99.5 F (37.5 C) 97.9 F (36.6 C) 98.8 F (37.1 C)  TempSrc: Oral Oral Oral Oral  Resp: 20 20 20 18   Height:      Weight:      SpO2: 96% 96% 97% 99%    Intake/Output Summary (Last 24 hours) at 03/14/16 1519 Last data filed at 03/14/16 0700  Gross per 24 hour  Intake   1050 ml  Output      0 ml  Net   1050 ml   Filed Weights   03/12/16 1759 03/12/16 2231  Weight: 77.111 kg (170 lb) 78.9 kg (173 lb 15.1 oz)    Exam:  General exam: Pleasant elderly male sitting up comfortably at edge of bed. Does not look septic or toxic. Respiratory system: Reduced breath sounds in the bases. The rest of lung fields clear to auscultation No increased work of breathing. Cardiovascular system: S1 & S2 heard, RRR. No  JVD, murmurs, gallops, clicks or pedal edema.  Gastrointestinal system: Abdomen is nondistended, soft and nontender. Normal bowel sounds heard. No external bruising or ecchymosis noted. Central nervous system: Alert and oriented. No focal neurological deficits. Extremities: Symmetric 5 x 5 power.   Data Reviewed: Basic Metabolic Panel:  Recent Labs Lab 03/12/16 1853  03/13/16 0227 03/14/16 0514  NA 132* 134* 135  K 3.8 3.8 4.1  CL 100* 104 106  CO2 22 21* 21*  GLUCOSE 134* 144* 127*  BUN 23* 21* 18  CREATININE 0.84 0.87 0.94  CALCIUM 9.4 9.2 9.3   Liver Function Tests: No results for input(s): AST, ALT, ALKPHOS, BILITOT, PROT, ALBUMIN in the last 168 hours.  Recent Labs Lab 03/12/16 2114  LIPASE 25   No results for input(s): AMMONIA in the last 168 hours. CBC:  Recent Labs Lab 03/12/16 1853 03/13/16 0227 03/14/16 0514  WBC 9.5 7.9 7.8  HGB 9.6* 9.4* 9.5*  HCT 29.1* 28.4* 29.0*  MCV 83.6 84.3 84.5  PLT 119* 106* 112*   Cardiac Enzymes: No results for input(s): CKTOTAL, CKMB, CKMBINDEX, TROPONINI in the last 168 hours. BNP (last 3 results) No results for input(s): PROBNP in the last 8760 hours. CBG:  Recent Labs Lab 03/13/16 1228 03/13/16 1744 03/13/16 2150 03/14/16 0758 03/14/16 1208  GLUCAP 170* 99 134* 126* 111*    Recent Results (from the past 240 hour(s))  Culture, sputum-assessment     Status: None   Collection Time: 03/13/16  8:16 PM  Result Value Ref Range Status   Specimen Description SPUTUM  Final   Special Requests NONE  Final   Sputum evaluation THIS SPECIMEN IS ACCEPTABLE FOR SPUTUM CULTURE  Final   Report Status 03/13/2016 FINAL  Final  Culture, respiratory (NON-Expectorated)     Status: None (Preliminary result)   Collection Time: 03/13/16  8:16 PM  Result Value Ref Range Status   Specimen Description SPU  Final   Special Requests NONE  Final   Gram Stain PENDING  Incomplete   Culture   Final    Culture reincubated for better growth Performed at [facility]    Report Status PENDING  Incomplete         Studies: Ct Abdomen Pelvis Wo Contrast  03/12/2016  CLINICAL DATA:  Abdominal distention and pain. EXAM: CT ABDOMEN AND PELVIS WITHOUT CONTRAST TECHNIQUE: Multidetector CT imaging of the abdomen and pelvis was performed following the standard protocol without IV contrast. COMPARISON:   07/02/2013 FINDINGS: There is unchanged splenomegaly, measuring 8.2 x 13.4 x 16 cm. No focal splenic lesion is evident. There are unremarkable unenhanced appearances of the liver, gallbladder and bile ducts. There are unremarkable unenhanced appearances of the pancreas and adrenals. There are lower pole left collecting system calculi measuring 4-6 mm, unchanged. Kidneys are otherwise unremarkable. Ureters and urinary bladder are unremarkable. Stomach and small bowel are unremarkable. Oral contrast has reached the rectum. There is mild generalized mural thickening of the colon without focal inflammation. The abdominal aorta is normal in caliber. There is mild atherosclerotic calcification. There is no adenopathy in the abdomen or pelvis. There is no ascites. There is confluent alveolar opacity with air bronchograms in the posterior right lower lobe base. This is present to a lesser degree in the left lower lobe base. This could represent pneumonia. There is a very small left pleural effusion. There is a trace right pleural effusion. IMPRESSION: 1. Mild generalized mural thickening of the colon and appendix without focal inflammatory change. A minimal  degree of colitis might be present. There is no bowel obstruction. There is no extraluminal air. 2. Unchanged splenomegaly. 3. Left nephrolithiasis. 4. Consolidation in both posterior lung bases, right worse than left. This is more focally consolidated than would be typical for dependent atelectasis, and it may represent pneumonia. There is a very small left pleural effusion and a trace right pleural effusion. Electronically Signed   By: Andreas Newport M.D.   On: 03/12/2016 22:36   Dg Chest 2 View  03/12/2016  CLINICAL DATA:  Productive cough with yellow mucus and generalized weakness x2-3 weeks. Pt states he had a hard cough about a week ago and a bulge appeared on his lower left rib cage that bruised black and blue. Hx of diabetes, CAD, HTN EXAM: CHEST  2 VIEW  COMPARISON:  11/19/2014 FINDINGS: There changes from cardiac surgery and aortic valve replacement. Cardiac silhouette is mildly enlarged. No mediastinal or hilar masses or convincing adenopathy. There is some increased opacity in the lower lobes best seen on the lateral view. This is more prominent on the left. This is likely due to at least a component of acute bronchitis. Small area of bronchopneumonia in the left lower lobe is possible. Remainder of the lungs is clear. No pleural effusion or pneumothorax. Bony thorax is grossly intact. IMPRESSION: Lower lobe increased opacity, left greater than right, consistent with bronchial wall inflammation/bronchitis. A component of left pneumonia is possible. Electronically Signed   By: Lajean Manes M.D.   On: 03/12/2016 19:10        Scheduled Meds: . aspirin EC  81 mg Oral Daily  . clopidogrel  75 mg Oral Daily  . dextromethorphan-guaiFENesin  1 tablet Oral BID  . enoxaparin (LOVENOX) injection  40 mg Subcutaneous Q24H  . gabapentin  300 mg Oral BID  . insulin aspart  0-5 Units Subcutaneous QHS  . insulin aspart  0-9 Units Subcutaneous TID WC  . isosorbide mononitrate  30 mg Oral Daily  . levofloxacin (LEVAQUIN) IV  750 mg Intravenous Q24H  . pantoprazole  40 mg Oral Q1200  . pravastatin  40 mg Oral Daily  . traMADol  50 mg Oral BID  . vitamin B-12  1,000 mcg Oral Daily   Continuous Infusions:    Principal Problem:   CAP (community acquired pneumonia) Active Problems:   Essential hypertension   Thrombocytopenia (HCC)   BPH (benign prostatic hyperplasia)   Hyperlipidemia   Splenomegaly   Peripheral arterial disease (HCC)   Mantle cell lymphoma (HCC)   Carotid artery disease (HCC)   Leg DVT (deep venous thromboembolism), chronic (HCC)   S/P TAVR (transcatheter aortic valve replacement)   Type 2 diabetes mellitus with vascular disease (HCC)   Epigastric abdominal pain    Time spent: 20 minutes.    Vernell Leep, MD, FACP,  FHM. [facility] Pager [phone]  If 7PM-7AM, please contact night-coverage [email or web address] Password [email or web address] 03/14/2016, 3:19 PM    LOS: 2 days

## 2016-03-14 NOTE — Progress Notes (Signed)
Occupational Therapy Evaluation Patient Details Name: [name] MRN: [id number] DOB: 1927-03-05 Today's Date: 03/14/2016    History of Present Illness 82 y.o. male with PMH of hypertension, diabetes mellitus, COPD, asthma, CAD, S/P of CABG and stent placement, BPH, thrombocytopenia, aortic stenosis (s/p of TAVR), PVD, DVT, osteomyelitis, intra-abdominal mantle cell lymphoma in remission, who presents with fever, productive cough and epigastric abdominal pain.   Clinical Impression   Patient presents to OT with above diagnosis and below functional limitations. He will benefit from skilled OT to maximize ADL independence and safety. OT will follow.    Follow Up Recommendations  Supervision/Assistance - 24 hour (possibly HHOT)    Equipment Recommendations  None recommended by OT    Recommendations for Other Services PT consult     Precautions / Restrictions Precautions Precautions: Fall Precaution Comments: L first foot ray amputation  Restrictions Weight Bearing Restrictions: No      Mobility Bed Mobility Overal bed mobility: Needs Assistance Bed Mobility: Supine to Sit;Sit to Supine     Supine to sit: Supervision;HOB elevated Sit to supine: Supervision;HOB elevated   General bed mobility comments: increased time and effort, supervision for safety  Transfers Overall transfer level: Needs assistance Equipment used: Straight cane Transfers: Sit to/from Stand Sit to Stand: Min guard         General transfer comment: mildly unsteady during transfer/ambulation to bathroom    Balance                                            ADL Overall ADL's : Needs assistance/impaired Eating/Feeding: Set up;Bed level   Grooming: Wash/dry hands;Wash/dry face;Brushing hair;Min Dispensing optician: Min guard;Ambulation;Regular Toilet;Grab bars   Toileting- Clothing Manipulation and Hygiene: Min guard;Sit to/from  stand       Functional mobility during ADLs: Min guard;Cane General ADL Comments: Patient participated in OT evaluation as above. He was limited by weakness, shortness of breath. Back to bed at end of session.      Vision     Perception     Praxis      Pertinent Vitals/Pain Pain Assessment: No/denies pain     Hand Dominance Right   Extremity/Trunk Assessment Upper Extremity Assessment Upper Extremity Assessment: Overall WFL for tasks assessed   Lower Extremity Assessment Lower Extremity Assessment: Defer to PT evaluation       Communication Communication Communication: No difficulties   Cognition Arousal/Alertness: Awake/alert Behavior During Therapy: WFL for tasks assessed/performed Overall Cognitive Status: No family/caregiver present to determine baseline cognitive functioning       Memory: Decreased short-term memory (asked what day it was multiple times during session)             General Comments       Exercises       Shoulder Instructions      Home Living Family/patient expects to be discharged to:: Private residence Living Arrangements: Spouse/significant other Available Help at Discharge: Family;Other (Comment) (daughter lives nearby) Type of Home: House Home Access: Stairs to enter Technical brewer of Steps: 1 Entrance Stairs-Rails: Right Home Layout: One level     Bathroom Shower/Tub: Occupational psychologist: Handicapped height     Home Equipment: [place] - single point;Shower seat;Grab bars - tub/shower  Prior Functioning/Environment Level of Independence: Independent with assistive device(s)        Comments: drives, generally pretty independent with BADLs    OT Diagnosis: Generalized weakness   OT Problem List: Decreased strength;Decreased activity tolerance;Impaired balance (sitting and/or standing);Decreased knowledge of use of DME or AE;Decreased safety awareness;Cardiopulmonary status limiting  activity   OT Treatment/Interventions: Self-care/ADL training;Energy conservation;DME and/or AE instruction;Therapeutic activities;Patient/family education    OT Goals(Current goals can be found in the care plan section) Acute Rehab OT Goals Patient Stated Goal: none stated OT Goal Formulation: With patient Time For Goal Achievement: 03/28/16 Potential to Achieve Goals: Good ADL Goals Pt Will Perform Upper Body Bathing: with supervision;sitting;standing Pt Will Perform Lower Body Bathing: with supervision;sit to/from stand Pt Will Perform Upper Body Dressing: with supervision;sitting;standing Pt Will Perform Lower Body Dressing: with supervision;sit to/from stand Pt Will Transfer to Toilet: with supervision;ambulating;regular height toilet Pt Will Perform Toileting - Clothing Manipulation and hygiene: with supervision;sit to/from stand  OT Frequency: Min 2X/week   Barriers to D/C:            Co-evaluation              End of Session Equipment Utilized During Treatment: Other (comment) (cane)  Activity Tolerance: Patient tolerated treatment well Patient left: in bed;with call bell/phone within reach;with bed alarm set   Time: [id number] OT Time Calculation (min): 17 min Charges:  OT General Charges $OT Visit: 1 Procedure OT Evaluation $OT Eval Low Complexity: 1 Procedure G-Codes:    Tadeusz Stahl A 03-22-16, 10:16 AM

## 2016-03-14 NOTE — Progress Notes (Signed)
Utilization review completed.  

## 2016-03-14 NOTE — Progress Notes (Addendum)
Initial Nutrition Assessment  DOCUMENTATION CODES:   Non-severe (moderate) malnutrition in context of chronic illness  INTERVENTION:  -Ensure Enlive po TID, each supplement provides 350 kcal and 20 grams of protein -RD continue to monitor  NUTRITION DIAGNOSIS:   Malnutrition related to chronic illness as evidenced by moderate depletion of body fat, moderate depletions of muscle mass.  GOAL:   Patient will meet greater than or equal to 90% of their needs  MONITOR:   PO intake, Supplement acceptance, Labs, I & O's, Skin  REASON FOR ASSESSMENT:   Malnutrition Screening Tool    ASSESSMENT:   [name] is a 82 y.o. male with PMH of hypertension, diabetes mellitus, COPD, asthma, CAD, S/P of CABG and stent placement, BPH, thrombocytopenia, aortic stenosis (s/p of TAVR), PVD, DVT, osteomyelitis, intra-abdominal mantle cell lymphoma in remission, who presents with fever, productive cough and epigastric abdominal pain.  Spoke with [name], wife at bedside. He endorses poor appetite x3 weeks PTA, however, his wife has been "forcing him to eat." She also purchased ensure and provided to him at home. He has been drinking them when he does not want meals.  He does endorse his appetite improving. Received a tray last night and consumed 100%.   Wife states "I kept his pudding from him until he ate his tray, otherwise he won't eat it."  He also exhibits a 14#/7% insignificant wt loss in 4 months.   Unsure how much of patient's appetite is r/t aging vs acute illness.  Nutrition-Focused physical exam completed. Findings are moderate fat depletion, moderate muscle depletion, and no edema.   He underwent a CT scan upon admission that they were concerned about. Reviewed CT and spoke with his RN. She stated Dr. Algis Liming is not concerned about findings at this time; he is supposed to call patient at some point today.  Will provide Ensure during stay. Patient claims he will be  discharged tomorrow.  Labs reviewed. Medications: B12 - 1078mcg; Morphine PRN  Diet Order:  Diet heart healthy/carb modified Room service appropriate?: Yes; Fluid consistency:: Thin  Skin:  Reviewed, no issues  Last BM:  4/8  Height:   Ht Readings from Last 1 Encounters:  03/12/16 6' (1.829 m)    Weight:   Wt Readings from Last 1 Encounters:  03/12/16 173 lb 15.1 oz (78.9 kg)    Ideal Body Weight:  80.9 kg  BMI:  Body mass index is 23.59 kg/(m^2).  Estimated Nutritional Needs:   Kcal:  1900-2400 calories  Protein:  80-95 grams  Fluid:  >/= 1.9L  EDUCATION NEEDS:   No education needs identified at this time  Satira Anis. Tarrie Mcmichen, MS, RD LDN After Hours/Weekend Pager [phone]

## 2016-03-15 DIAGNOSIS — R161 Splenomegaly, not elsewhere classified: Secondary | ICD-10-CM

## 2016-03-15 LAB — HEMOGLOBIN A1C
HEMOGLOBIN A1C: 6.3 % — AB (ref 4.8–5.6)
MEAN PLASMA GLUCOSE: 134 mg/dL

## 2016-03-15 LAB — RESPIRATORY VIRUS PANEL
Adenovirus: NEGATIVE
Influenza A: NEGATIVE
Influenza B: NEGATIVE
METAPNEUMOVIRUS: NEGATIVE
PARAINFLUENZA 3 A: NEGATIVE
Parainfluenza 1: NEGATIVE
Parainfluenza 2: NEGATIVE
RESPIRATORY SYNCYTIAL VIRUS A: NEGATIVE
Respiratory Syncytial Virus B: NEGATIVE
Rhinovirus: NEGATIVE

## 2016-03-15 LAB — GLUCOSE, CAPILLARY
GLUCOSE-CAPILLARY: 131 mg/dL — AB (ref 65–99)
GLUCOSE-CAPILLARY: 163 mg/dL — AB (ref 65–99)

## 2016-03-15 LAB — LEGIONELLA PNEUMOPHILA SEROGP 1 UR AG: L. PNEUMOPHILA SEROGP 1 UR AG: NEGATIVE

## 2016-03-15 MED ORDER — ENSURE ENLIVE PO LIQD: 237.0000 mL | Freq: Three times a day (TID) | ORAL | Status: DC

## 2016-03-15 MED ORDER — LEVOFLOXACIN 750 MG PO TABS: 750.0000 mg | ORAL_TABLET | Freq: Every day | ORAL | Status: DC

## 2016-03-15 NOTE — Discharge Instructions (Signed)

## 2016-03-15 NOTE — Progress Notes (Signed)
Pt selected [facility] for HH needs. Referral given to in house rep.  

## 2016-03-15 NOTE — Discharge Summary (Addendum)
Physician Discharge Summary  RAUN KEIL  [id number]  DOB: November 23, 1927  DOA: 03/12/2016  PCP: Donnajean Lopes, MD  Admit date: 03/12/2016 Discharge date: 03/15/2016  Time spent: Greater than 30 30 minutes  Recommendations for Outpatient Follow-up:  1. Dr. Bevelyn Buckles, PCP in 5 days with repeat labs (CBC & BMP). Please follow final blood & sputum culture, RSV panel, urine Legionella antigen & A1c results that were sent from the hospital. 2. Recommend follow-up chest x-ray in 3-4 weeks to ensure resolution of pneumonia findings.  Discharge Diagnoses:  Principal Problem:   CAP (community acquired pneumonia) Active Problems:   Essential hypertension   Thrombocytopenia (HCC)   BPH (benign prostatic hyperplasia)   Hyperlipidemia   Splenomegaly   Peripheral arterial disease (HCC)   Mantle cell lymphoma (HCC)   Carotid artery disease (HCC)   Leg DVT (deep venous thromboembolism), chronic (HCC)   S/P TAVR (transcatheter aortic valve replacement)   Type 2 diabetes mellitus with vascular disease ([place])   Epigastric abdominal pain   Discharge Condition: Improved & Stable  Diet recommendation: Heart healthy diet.  Filed Weights   03/12/16 1759 03/12/16 2231  Weight: 77.111 kg (170 lb) 78.9 kg (173 lb 15.1 oz)    History of present illness:  82 y.o. male with PMH of hypertension, diabetes mellitus, COPD, asthma, CAD, S/P of CABG and stent placement, BPH, thrombocytopenia, aortic stenosis (s/p of TAVR), PVD, DVT, osteomyelitis, intra-abdominal mantle cell lymphoma in remission, who presented to the ED with complaints of fever, productive cough and upper abdominal pain from coughing. Seen by his PCP on 4/7 and had negative flu test. Constipated at [facility] gave him 2 stool softeners and MiraLAX followed by 2 loose stools day prior to admission. Admitted for community-acquired pneumonia.   Hospital Course:   Community-acquired pneumonia - As evidenced by productive cough,  fever and chest x-ray findings - Not septic on admission and hemodynamically stable. - Started empirically on IV levofloxacin and has completed 3 days in the hospital - Lactate normal. RSV panel pending. Pro-calcitonin 0.14. Urinary streptococcal antigen: Negative. Flu panel PCR: Negative. Urine Legionella antigen: Pending. Blood culture: Pending. Sputum culture: Pending. - Improved. Complete total 7 days of antibiotics. - Recommend follow-up chest x-ray in 3-4 weeks to ensure resolution of pneumonia findings.  Abdominal pain - As per patient and spouse, patient had a violent coughing spell approximately 2-3 weeks ago followed by some bruising over the left upper abdomen and since then had abdominal pain which was only present and worse on coughing and none when he was not coughing. - Likely muscular in etiology from coughing. - Although CT abdomen suggested diffuse colon thickening concerning for colitis, patient did not have any GI symptoms. Lipase normal. DC'ed Flagyl and monitor clinically. - No further abdominal pain or diarrhea reported. - Resolved.  Essential hypertension - Controlled  Hyperlipidemia - Continue statins  CAD status post CABG and PCI - No reported chest pain. Continue aspirin, Plavix, Imdur, pravastatin  Type II DM - A1c 6.8 on 11/01/14. On metformin at home-continue.  History of intra-abdominal mantle cell lymphoma - Status post chemotherapy and said to be in remission. Used to see Dr. Beryle Beams - CT abdomen and pelvis without contrast showed mild generalized mural thickening of the colon and appendix without focal inflammatory change-etiology of this finding unclear. Unchanged splenomegaly also noted. No GI symptoms. Outpatient follow-up as deemed necessary.  Anemia and thrombocytopenia - Stable.Chronic.   Consultants:  None  Procedures:  None  Discharge Exam:  Complaints:  Denies complaints. No dyspnea or chest pain. Minimal mostly dry cough.  No abdominal pain, nausea or vomiting. Had normal BM today.  Filed Vitals:   03/14/16 1437 03/14/16 1958 03/14/16 2013 03/15/16 0706  BP: 127/48 154/71  149/79  Pulse: 72 87  83  Temp: 98.8 F (37.1 C) 98 F (36.7 C)  98 F (36.7 C)  TempSrc: Oral Oral    Resp: 18 19  18   Height:      Weight:      SpO2: 99% 97% 98% 95%    General exam: Pleasant elderly male sitting up comfortably at edge of bed seen eating breakfast this morning. Spouse at bedside.  Respiratory system: Occasional basal crackles but otherwise clear to auscultation. No increased work of breathing. Cardiovascular system: S1 & S2 heard, RRR. No JVD, murmurs, gallops, clicks or pedal edema.  Gastrointestinal system: Abdomen is nondistended, soft and nontender. Normal bowel sounds heard. Small band of bruising over right lower abdomen/groin-was not present on admission, likely secondary to Lovenox DVT prophylaxis. Central nervous system: Alert and oriented. No focal neurological deficits. Extremities: Symmetric 5 x 5 power. S/p left first foot ray rehabilitation  Discharge Instructions      Discharge Instructions    Call MD for:  difficulty breathing, headache or visual disturbances    Complete by:  As directed      Call MD for:  extreme fatigue    Complete by:  As directed      Call MD for:  persistant dizziness or light-headedness    Complete by:  As directed      Call MD for:  persistant nausea and vomiting    Complete by:  As directed      Call MD for:  severe uncontrolled pain    Complete by:  As directed      Call MD for:  temperature >100.4    Complete by:  As directed      Diet - low sodium heart healthy    Complete by:  As directed      Diet Carb Modified    Complete by:  As directed      Increase activity slowly    Complete by:  As directed             Medication List    TAKE these medications        ANORO ELLIPTA 62.5-25 MCG/INH Aepb  Generic drug:  umeclidinium-vilanterol  Inhale 1 puff  into the lungs daily.     aspirin EC 81 MG tablet  Take 81 mg by mouth every morning.     clopidogrel 75 MG tablet  Commonly known as:  PLAVIX  TAKE ONE TABLET BY MOUTH ONCE DAILY     feeding supplement (ENSURE ENLIVE) Liqd  Take 237 mLs by mouth 3 (three) times daily between meals.     gabapentin 300 MG capsule  Commonly known as:  NEURONTIN  Take 300 mg by mouth 2 (two) times daily.     hydrOXYzine 25 MG tablet  Commonly known as:  ATARAX/VISTARIL  Take 25 mg by mouth every 6 (six) hours as needed for itching.     isosorbide mononitrate 30 MG 24 hr tablet  Commonly known as:  IMDUR  TAKE ONE TABLET BY MOUTH ONCE DAILY     levofloxacin 750 MG tablet  Commonly known as:  LEVAQUIN  Take 1 tablet (750 mg total) by mouth daily.     metFORMIN 500 MG tablet  Commonly  known as:  GLUCOPHAGE  Take 500 mg by mouth 2 (two) times daily with a meal.     nitroGLYCERIN 0.4 MG SL tablet  Commonly known as:  NITROSTAT  Place 1 tablet (0.4 mg total) under the tongue every 5 (five) minutes as needed for chest pain.     pravastatin 40 MG tablet  Commonly known as:  PRAVACHOL  Take 40 mg by mouth daily.     traMADol 50 MG tablet  Commonly known as:  ULTRAM  Take 50 mg by mouth 2 (two) times daily.     vitamin B-12 500 MCG tablet  Commonly known as:  CYANOCOBALAMIN  Take 1,000 mcg by mouth daily.       Follow-up Information    Follow up with Donnajean Lopes, MD. Schedule an appointment as soon as possible for a visit in 5 days.   Specialty:  Internal Medicine   Why:  To be seen with repeat labs (CBC & BMP).   Contact information:   [address] [place] [address] [phone]       Get Medicines reviewed and adjusted: Please take all your medications with you for your next visit with your Primary MD  Please request your Primary MD to go over all hospital tests and procedure/radiological results at the follow up. Please ask your Primary MD to get all Hospital  records sent to his/her office.  If you experience worsening of your admission symptoms, develop shortness of breath, life threatening emergency, suicidal or homicidal thoughts you must seek medical attention immediately by calling 911 or calling your MD immediately if symptoms less severe.  You must read complete instructions/literature along with all the possible adverse reactions/side effects for all the Medicines you take and that have been prescribed to you. Take any new Medicines after you have completely understood and accept all the possible adverse reactions/side effects.   Do not drive when taking pain medications.   Do not take more than prescribed Pain, Sleep and Anxiety Medications  Special Instructions: If you have smoked or chewed Tobacco in the last 2 yrs please stop smoking, stop any regular Alcohol and or any Recreational drug use.  Wear Seat belts while driving.  Please note  You were cared for by a hospitalist during your hospital stay. Once you are discharged, your primary care physician will handle any further medical issues. Please note that NO REFILLS for any discharge medications will be authorized once you are discharged, as it is imperative that you return to your primary care physician (or establish a relationship with a primary care physician if you do not have one) for your aftercare needs so that they can reassess your need for medications and monitor your lab values.    The results of significant diagnostics from this hospitalization (including imaging, microbiology, ancillary and laboratory) are listed below for reference.    Significant Diagnostic Studies: Ct Abdomen Pelvis Wo Contrast  03/12/2016  CLINICAL DATA:  Abdominal distention and pain. EXAM: CT ABDOMEN AND PELVIS WITHOUT CONTRAST TECHNIQUE: Multidetector CT imaging of the abdomen and pelvis was performed following the standard protocol without IV contrast. COMPARISON:  07/02/2013 FINDINGS: There is  unchanged splenomegaly, measuring 8.2 x 13.4 x 16 cm. No focal splenic lesion is evident. There are unremarkable unenhanced appearances of the liver, gallbladder and bile ducts. There are unremarkable unenhanced appearances of the pancreas and adrenals. There are lower pole left collecting system calculi measuring 4-6 mm, unchanged. Kidneys are otherwise unremarkable. Ureters and urinary bladder  are unremarkable. Stomach and small bowel are unremarkable. Oral contrast has reached the rectum. There is mild generalized mural thickening of the colon without focal inflammation. The abdominal aorta is normal in caliber. There is mild atherosclerotic calcification. There is no adenopathy in the abdomen or pelvis. There is no ascites. There is confluent alveolar opacity with air bronchograms in the posterior right lower lobe base. This is present to a lesser degree in the left lower lobe base. This could represent pneumonia. There is a very small left pleural effusion. There is a trace right pleural effusion. IMPRESSION: 1. Mild generalized mural thickening of the colon and appendix without focal inflammatory change. A minimal degree of colitis might be present. There is no bowel obstruction. There is no extraluminal air. 2. Unchanged splenomegaly. 3. Left nephrolithiasis. 4. Consolidation in both posterior lung bases, right worse than left. This is more focally consolidated than would be typical for dependent atelectasis, and it may represent pneumonia. There is a very small left pleural effusion and a trace right pleural effusion. Electronically Signed   By: Andreas Newport M.D.   On: 03/12/2016 22:36   Dg Chest 2 View  03/12/2016  CLINICAL DATA:  Productive cough with yellow mucus and generalized weakness x2-3 weeks. Pt states he had a hard cough about a week ago and a bulge appeared on his lower left rib cage that bruised black and blue. Hx of diabetes, CAD, HTN EXAM: CHEST  2 VIEW COMPARISON:  11/19/2014 FINDINGS:  There changes from cardiac surgery and aortic valve replacement. Cardiac silhouette is mildly enlarged. No mediastinal or hilar masses or convincing adenopathy. There is some increased opacity in the lower lobes best seen on the lateral view. This is more prominent on the left. This is likely due to at least a component of acute bronchitis. Small area of bronchopneumonia in the left lower lobe is possible. Remainder of the lungs is clear. No pleural effusion or pneumothorax. Bony thorax is grossly intact. IMPRESSION: Lower lobe increased opacity, left greater than right, consistent with bronchial wall inflammation/bronchitis. A component of left pneumonia is possible. Electronically Signed   By: Lajean Manes M.D.   On: 03/12/2016 19:10    Microbiology: Recent Results (from the past 240 hour(s))  Culture, blood (routine x 2) Call MD if unable to obtain prior to antibiotics being given     Status: None (Preliminary result)   Collection Time: 03/12/16  7:55 PM  Result Value Ref Range Status   Specimen Description BLOOD BLOOD RIGHT FOREARM  Final   Special Requests BOTTLES DRAWN AEROBIC AND ANAEROBIC 5CC EACH  Final   Culture   Final    NO GROWTH 1 DAY Performed at [facility]    Report Status PENDING  Incomplete  Culture, blood (routine x 2) Call MD if unable to obtain prior to antibiotics being given     Status: None (Preliminary result)   Collection Time: 03/13/16  5:25 AM  Result Value Ref Range Status   Specimen Description BLOOD LEFT ANTECUBITAL  Final   Special Requests IN PEDIATRIC BOTTLE 3CC  Final   Culture   Final    NO GROWTH 1 DAY Performed at [facility]    Report Status PENDING  Incomplete  Culture, sputum-assessment     Status: None   Collection Time: 03/13/16  8:16 PM  Result Value Ref Range Status   Specimen Description SPUTUM  Final   Special Requests NONE  Final   Sputum evaluation THIS SPECIMEN  IS ACCEPTABLE FOR SPUTUM CULTURE  Final   Report Status  03/13/2016 FINAL  Final  Culture, respiratory (NON-Expectorated)     Status: None (Preliminary result)   Collection Time: 03/13/16  8:16 PM  Result Value Ref Range Status   Specimen Description SPU  Final   Special Requests NONE  Final   Gram Stain   Final    ABUNDANT WBC PRESENT, PREDOMINANTLY PMN RARE SQUAMOUS EPITHELIAL CELLS PRESENT FEW GRAM POSITIVE COCCI IN PAIRS IN CLUSTERS THIS SPECIMEN IS ACCEPTABLE FOR SPUTUM CULTURE Performed at [facility]    Culture   Final    Culture reincubated for better growth Performed at [facility]    Report Status PENDING  Incomplete     Labs: Basic Metabolic Panel:  Recent Labs Lab 03/12/16 1853 03/13/16 0227 03/14/16 0514  NA 132* 134* 135  K 3.8 3.8 4.1  CL 100* 104 106  CO2 22 21* 21*  GLUCOSE 134* 144* 127*  BUN 23* 21* 18  CREATININE 0.84 0.87 0.94  CALCIUM 9.4 9.2 9.3   Liver Function Tests: No results for input(s): AST, ALT, ALKPHOS, BILITOT, PROT, ALBUMIN in the last 168 hours.  Recent Labs Lab 03/12/16 2114  LIPASE 25   No results for input(s): AMMONIA in the last 168 hours. CBC:  Recent Labs Lab 03/12/16 1853 03/13/16 0227 03/14/16 0514  WBC 9.5 7.9 7.8  HGB 9.6* 9.4* 9.5*  HCT 29.1* 28.4* 29.0*  MCV 83.6 84.3 84.5  PLT 119* 106* 112*   Cardiac Enzymes: No results for input(s): CKTOTAL, CKMB, CKMBINDEX, TROPONINI in the last 168 hours. BNP: BNP (last 3 results)  Recent Labs  03/12/16 1856  BNP 2301.3*    ProBNP (last 3 results) No results for input(s): PROBNP in the last 8760 hours.  CBG:  Recent Labs Lab 03/14/16 0758 03/14/16 1208 03/14/16 1726 03/14/16 1954 03/15/16 0730  GLUCAP 126* 111* 133* 135* 131*       Signed:  Vernell Leep, MD, FACP, FHM. [facility] Pager [phone]  If 7PM-7AM, please contact night-coverage [email or web address] Password [email or web address] 03/15/2016, 10:23 AM  Addendum  Addressing CDI query  Non-severe (moderate) malnutrition in  context of chronic illness  - Per dietitian.  Vernell Leep, MD, FACP, FHM. [facility] Pager [phone]  If 7PM-7AM, please contact night-coverage [email or web address] Password [email or web address] 03/16/2016, 6:09 PM

## 2016-03-15 NOTE — Evaluation (Signed)
Physical Therapy Evaluation Patient Details Name: CRYSTIAN FRITH MRN: [id number] DOB: 09-Apr-1927 Today's Date: 03/15/2016   History of Present Illness  82 y.o. male with PMH of hypertension, diabetes mellitus, COPD, asthma, CAD, S/P of CABG and stent placement, BPH, thrombocytopenia, aortic stenosis (s/p of TAVR), PVD, DVT, osteomyelitis, intra-abdominal mantle cell lymphoma in remission, who presents with fever, productive cough and epigastric abdominal pain.  Clinical Impression  The patient is dyspneic with ambulation. Sats 97% on RA after 90' walk. Patient will benefit from HHPT. DC'd today to home.    Follow Up Recommendations Home health PT;Supervision/Assistance - 24 hour    Equipment Recommendations  None recommended by PT    Recommendations for Other Services       Precautions / Restrictions Precautions Precautions: Fall Precaution Comments: L first foot ray amputation       Mobility  Bed Mobility               General bed mobility comments: on bed edge  Transfers Overall transfer level: Needs assistance Equipment used: Rolling walker (2 wheeled) Transfers: Sit to/from Stand Sit to Stand: Min guard         General transfer comment: wife present , stood without assistance  Ambulation/Gait Ambulation/Gait assistance: Supervision Ambulation Distance (Feet): 90 Feet Assistive device: Rolling walker (2 wheeled)   Gait velocity: decreased   General Gait Details: 3 stop to rest breaks for dyspnea  Stairs            Wheelchair Mobility    Modified Rankin (Stroke Patients Only)       Balance Overall balance assessment: Needs assistance Sitting-balance support: Feet supported Sitting balance-Leahy Scale: Fair     Standing balance support: During functional activity;Single extremity supported Standing balance-Leahy Scale: Fair Standing balance comment: stood to urinate, manipulated zipper and pants with 1 UE support.                              Pertinent Vitals/Pain Pain Assessment: No/denies pain    Home Living Family/patient expects to be discharged to:: Private residence   Available Help at Discharge: Family;Other (Comment) Type of Home: House Home Access: Stairs to enter Entrance Stairs-Rails: Right Entrance Stairs-Number of Steps: 1 Home Layout: One level Home Equipment: Cane - single point;Shower seat;Grab bars - tub/shower;Walker - 2 wheels      Prior Function Level of Independence: Independent with assistive device(s)         Comments: drives, generally pretty independent with BADLs     Hand Dominance        Extremity/Trunk Assessment   Upper Extremity Assessment: Defer to OT evaluation           Lower Extremity Assessment: Generalized weakness      Cervical / Trunk Assessment: Kyphotic  Communication   Communication: No difficulties  Cognition Arousal/Alertness: Awake/alert Behavior During Therapy: WFL for tasks assessed/performed         Memory: Decreased recall of precautions;Decreased short-term memory              General Comments      Exercises        Assessment/Plan    PT Assessment All further PT needs can be met in the next venue of care  PT Diagnosis Difficulty walking;Generalized weakness   PT Problem List Decreased strength;Decreased activity tolerance;Decreased balance;Decreased mobility;Cardiopulmonary status limiting activity  PT Treatment Interventions     PT Goals (Current goals can be found  in the Care Plan section) Acute Rehab PT Goals Patient Stated Goal: to go home, have therapy PT Goal Formulation: All assessment and education complete, DC therapy    Frequency     Barriers to discharge        Co-evaluation               End of Session Equipment Utilized During Treatment: Gait belt Activity Tolerance: Patient tolerated treatment well Patient left: in chair;with call bell/phone within reach;with family/visitor  present Nurse Communication: Mobility status         Time: 1202-1236 PT Time Calculation (min) (ACUTE ONLY): 34 min   Charges:   PT Evaluation $PT Eval Low Complexity: 1 Procedure PT Treatments $Gait Training: 8-22 mins   PT G Codes:        Claretha Cooper 03/15/2016, 1:24 PM Tresa Endo PT [phone]

## 2016-03-15 NOTE — Care Management Important Message (Signed)
Important Message  Patient Details IM Letter given to Cookie/Case Manager to present to Patient Name: [name] MRN: [id number] Date of Birth: Aug 03, 1927   Medicare Important Message Given:  Yes    Camillo Flaming 03/15/2016, 11:00 AMImportant Message  Patient Details  Name: [name] MRN: [id number] Date of Birth: 05/28/1927   Medicare Important Message Given:  Yes    Camillo Flaming 03/15/2016, 11:00 AM

## 2016-03-16 LAB — CULTURE, RESPIRATORY: CULTURE: NORMAL

## 2016-03-16 LAB — CULTURE, RESPIRATORY W GRAM STAIN

## 2016-03-18 LAB — CULTURE, BLOOD (ROUTINE X 2)
CULTURE: NO GROWTH
CULTURE: NO GROWTH

## 2016-03-23 ENCOUNTER — Ambulatory Visit: Payer: PRIVATE HEALTH INSURANCE | Admitting: Sports Medicine

## 2016-04-06 ENCOUNTER — Inpatient Hospital Stay (HOSPITAL_COMMUNITY)
Admission: EM | Admit: 2016-04-06 | Discharge: 2016-04-13 | DRG: 871 | Disposition: A | Discharge status: Skilled Nursing Facility | Payer: Medicare Other | Attending: Internal Medicine | Admitting: Internal Medicine

## 2016-04-06 ENCOUNTER — Emergency Department (HOSPITAL_COMMUNITY): Payer: Medicare Other

## 2016-04-06 ENCOUNTER — Encounter (HOSPITAL_COMMUNITY): Payer: Self-pay

## 2016-04-06 DIAGNOSIS — T826XXA Infection and inflammatory reaction due to cardiac valve prosthesis, initial encounter: Secondary | ICD-10-CM | POA: Diagnosis present

## 2016-04-06 DIAGNOSIS — N400 Enlarged prostate without lower urinary tract symptoms: Secondary | ICD-10-CM | POA: Diagnosis present

## 2016-04-06 DIAGNOSIS — R509 Fever, unspecified: Secondary | ICD-10-CM

## 2016-04-06 DIAGNOSIS — Z888 Allergy status to other drugs, medicaments and biological substances status: Secondary | ICD-10-CM

## 2016-04-06 DIAGNOSIS — I10 Essential (primary) hypertension: Secondary | ICD-10-CM | POA: Diagnosis present

## 2016-04-06 DIAGNOSIS — Z79891 Long term (current) use of opiate analgesic: Secondary | ICD-10-CM

## 2016-04-06 DIAGNOSIS — I130 Hypertensive heart and chronic kidney disease with heart failure and stage 1 through stage 4 chronic kidney disease, or unspecified chronic kidney disease: Secondary | ICD-10-CM | POA: Diagnosis present

## 2016-04-06 DIAGNOSIS — Z9841 Cataract extraction status, right eye: Secondary | ICD-10-CM

## 2016-04-06 DIAGNOSIS — R161 Splenomegaly, not elsewhere classified: Secondary | ICD-10-CM | POA: Diagnosis present

## 2016-04-06 DIAGNOSIS — J45909 Unspecified asthma, uncomplicated: Secondary | ICD-10-CM | POA: Diagnosis present

## 2016-04-06 DIAGNOSIS — Z961 Presence of intraocular lens: Secondary | ICD-10-CM | POA: Diagnosis present

## 2016-04-06 DIAGNOSIS — R188 Other ascites: Secondary | ICD-10-CM | POA: Insufficient documentation

## 2016-04-06 DIAGNOSIS — Z9842 Cataract extraction status, left eye: Secondary | ICD-10-CM

## 2016-04-06 DIAGNOSIS — R6510 Systemic inflammatory response syndrome (SIRS) of non-infectious origin without acute organ dysfunction: Secondary | ICD-10-CM | POA: Diagnosis present

## 2016-04-06 DIAGNOSIS — I493 Ventricular premature depolarization: Secondary | ICD-10-CM | POA: Diagnosis present

## 2016-04-06 DIAGNOSIS — I252 Old myocardial infarction: Secondary | ICD-10-CM

## 2016-04-06 DIAGNOSIS — I5021 Acute systolic (congestive) heart failure: Secondary | ICD-10-CM | POA: Diagnosis present

## 2016-04-06 DIAGNOSIS — A4181 Sepsis due to Enterococcus: Secondary | ICD-10-CM | POA: Diagnosis not present

## 2016-04-06 DIAGNOSIS — A419 Sepsis, unspecified organism: Secondary | ICD-10-CM | POA: Insufficient documentation

## 2016-04-06 DIAGNOSIS — Z66 Do not resuscitate: Secondary | ICD-10-CM | POA: Diagnosis present

## 2016-04-06 DIAGNOSIS — I2510 Atherosclerotic heart disease of native coronary artery without angina pectoris: Secondary | ICD-10-CM | POA: Diagnosis present

## 2016-04-06 DIAGNOSIS — D638 Anemia in other chronic diseases classified elsewhere: Secondary | ICD-10-CM | POA: Diagnosis present

## 2016-04-06 DIAGNOSIS — N390 Urinary tract infection, site not specified: Secondary | ICD-10-CM | POA: Diagnosis present

## 2016-04-06 DIAGNOSIS — R911 Solitary pulmonary nodule: Secondary | ICD-10-CM | POA: Diagnosis present

## 2016-04-06 DIAGNOSIS — I5022 Chronic systolic (congestive) heart failure: Secondary | ICD-10-CM | POA: Insufficient documentation

## 2016-04-06 DIAGNOSIS — J440 Chronic obstructive pulmonary disease with acute lower respiratory infection: Secondary | ICD-10-CM | POA: Diagnosis present

## 2016-04-06 DIAGNOSIS — L97509 Non-pressure chronic ulcer of other part of unspecified foot with unspecified severity: Secondary | ICD-10-CM | POA: Diagnosis present

## 2016-04-06 DIAGNOSIS — Z881 Allergy status to other antibiotic agents status: Secondary | ICD-10-CM

## 2016-04-06 DIAGNOSIS — N179 Acute kidney failure, unspecified: Secondary | ICD-10-CM | POA: Diagnosis present

## 2016-04-06 DIAGNOSIS — I480 Paroxysmal atrial fibrillation: Secondary | ICD-10-CM | POA: Diagnosis present

## 2016-04-06 DIAGNOSIS — E1151 Type 2 diabetes mellitus with diabetic peripheral angiopathy without gangrene: Secondary | ICD-10-CM | POA: Diagnosis present

## 2016-04-06 DIAGNOSIS — Z91041 Radiographic dye allergy status: Secondary | ICD-10-CM

## 2016-04-06 DIAGNOSIS — L8990 Pressure ulcer of unspecified site, unspecified stage: Secondary | ICD-10-CM | POA: Insufficient documentation

## 2016-04-06 DIAGNOSIS — J90 Pleural effusion, not elsewhere classified: Secondary | ICD-10-CM | POA: Diagnosis present

## 2016-04-06 DIAGNOSIS — R062 Wheezing: Secondary | ICD-10-CM | POA: Insufficient documentation

## 2016-04-06 DIAGNOSIS — Z7901 Long term (current) use of anticoagulants: Secondary | ICD-10-CM

## 2016-04-06 DIAGNOSIS — E1122 Type 2 diabetes mellitus with diabetic chronic kidney disease: Secondary | ICD-10-CM | POA: Diagnosis present

## 2016-04-06 DIAGNOSIS — Z8249 Family history of ischemic heart disease and other diseases of the circulatory system: Secondary | ICD-10-CM

## 2016-04-06 DIAGNOSIS — Z7902 Long term (current) use of antithrombotics/antiplatelets: Secondary | ICD-10-CM

## 2016-04-06 DIAGNOSIS — I330 Acute and subacute infective endocarditis: Secondary | ICD-10-CM | POA: Diagnosis present

## 2016-04-06 DIAGNOSIS — G9340 Encephalopathy, unspecified: Secondary | ICD-10-CM | POA: Diagnosis present

## 2016-04-06 DIAGNOSIS — Z7982 Long term (current) use of aspirin: Secondary | ICD-10-CM

## 2016-04-06 DIAGNOSIS — Z87891 Personal history of nicotine dependence: Secondary | ICD-10-CM

## 2016-04-06 DIAGNOSIS — E1159 Type 2 diabetes mellitus with other circulatory complications: Secondary | ICD-10-CM | POA: Diagnosis present

## 2016-04-06 DIAGNOSIS — R000 Tachycardia, unspecified: Secondary | ICD-10-CM | POA: Diagnosis present

## 2016-04-06 DIAGNOSIS — I38 Endocarditis, valve unspecified: Secondary | ICD-10-CM

## 2016-04-06 DIAGNOSIS — D696 Thrombocytopenia, unspecified: Secondary | ICD-10-CM | POA: Diagnosis present

## 2016-04-06 DIAGNOSIS — I4891 Unspecified atrial fibrillation: Secondary | ICD-10-CM | POA: Diagnosis present

## 2016-04-06 DIAGNOSIS — N183 Chronic kidney disease, stage 3 (moderate): Secondary | ICD-10-CM | POA: Diagnosis present

## 2016-04-06 DIAGNOSIS — E785 Hyperlipidemia, unspecified: Secondary | ICD-10-CM | POA: Diagnosis present

## 2016-04-06 DIAGNOSIS — J449 Chronic obstructive pulmonary disease, unspecified: Secondary | ICD-10-CM | POA: Diagnosis present

## 2016-04-06 DIAGNOSIS — R748 Abnormal levels of other serum enzymes: Secondary | ICD-10-CM | POA: Diagnosis present

## 2016-04-06 DIAGNOSIS — E10621 Type 1 diabetes mellitus with foot ulcer: Secondary | ICD-10-CM | POA: Insufficient documentation

## 2016-04-06 DIAGNOSIS — E11621 Type 2 diabetes mellitus with foot ulcer: Secondary | ICD-10-CM | POA: Diagnosis present

## 2016-04-06 DIAGNOSIS — R627 Adult failure to thrive: Secondary | ICD-10-CM | POA: Diagnosis present

## 2016-04-06 DIAGNOSIS — Z8701 Personal history of pneumonia (recurrent): Secondary | ICD-10-CM

## 2016-04-06 DIAGNOSIS — J189 Pneumonia, unspecified organism: Secondary | ICD-10-CM | POA: Diagnosis present

## 2016-04-06 DIAGNOSIS — Y831 Surgical operation with implant of artificial internal device as the cause of abnormal reaction of the patient, or of later complication, without mention of misadventure at the time of the procedure: Secondary | ICD-10-CM | POA: Diagnosis present

## 2016-04-06 DIAGNOSIS — R6521 Severe sepsis with septic shock: Secondary | ICD-10-CM | POA: Diagnosis present

## 2016-04-06 DIAGNOSIS — C8310 Mantle cell lymphoma, unspecified site: Secondary | ICD-10-CM | POA: Diagnosis present

## 2016-04-06 DIAGNOSIS — K7460 Unspecified cirrhosis of liver: Secondary | ICD-10-CM | POA: Diagnosis present

## 2016-04-06 DIAGNOSIS — Z952 Presence of prosthetic heart valve: Secondary | ICD-10-CM

## 2016-04-06 DIAGNOSIS — Z7984 Long term (current) use of oral hypoglycemic drugs: Secondary | ICD-10-CM

## 2016-04-06 DIAGNOSIS — I129 Hypertensive chronic kidney disease with stage 1 through stage 4 chronic kidney disease, or unspecified chronic kidney disease: Secondary | ICD-10-CM | POA: Diagnosis present

## 2016-04-06 DIAGNOSIS — Z79899 Other long term (current) drug therapy: Secondary | ICD-10-CM

## 2016-04-06 MED ORDER — LEVOFLOXACIN IN D5W 750 MG/150ML IV SOLN
750.0000 mg | Freq: Once | INTRAVENOUS | Status: AC
  Administered 2016-04-07: 750 mg via INTRAVENOUS
  Filled 2016-04-06: qty 150

## 2016-04-06 MED ORDER — SODIUM CHLORIDE 0.9 % IV BOLUS (SEPSIS)
1000.0000 mL | Freq: Once | INTRAVENOUS | Status: AC
  Administered 2016-04-07: 1000 mL via INTRAVENOUS

## 2016-04-06 MED ORDER — SODIUM CHLORIDE 0.9 % IV BOLUS (SEPSIS)
500.0000 mL | Freq: Once | INTRAVENOUS | Status: AC
  Administered 2016-04-07: 500 mL via INTRAVENOUS

## 2016-04-06 MED ORDER — VANCOMYCIN HCL IN DEXTROSE 1-5 GM/200ML-% IV SOLN
1000.0000 mg | Freq: Once | INTRAVENOUS | Status: AC
  Administered 2016-04-07: 1000 mg via INTRAVENOUS
  Filled 2016-04-06: qty 200

## 2016-04-06 MED ORDER — DEXTROSE 5 % IV SOLN
2.0000 g | Freq: Once | INTRAVENOUS | Status: AC
  Administered 2016-04-07: 2 g via INTRAVENOUS
  Filled 2016-04-06: qty 2

## 2016-04-06 NOTE — ED Notes (Signed)
Per EMS patient from home.  C/O lower abdominal pain and difficulty urinating and fever.  Per EMS patient attempted to urinate and only able to void 2CC's.  Per EMS wife states that patient began running a fever of 100.0 at 9pm and gave him 325mg . Tylenol @ 9pm.  Temp taken again at 10pm was 101.00.  VS en route 142/80, PR 110, Temp 100.1, CBG 195. Per EMS patient treated and  d/c'd x3 weeks ago, treated for pneumonia.

## 2016-04-06 NOTE — ED Provider Notes (Signed)
CSN: [id number]     Arrival date & time 04/06/16  2331 History   First MD Initiated Contact with Patient 04/06/16 2337     Chief Complaint  Patient presents with  . Fever     (Consider location/radiation/quality/duration/timing/severity/associated sxs/prior Treatment) HPI Patient presents by EMS for fever up to 101 this evening. Also has had increased confusion per wife. States patient's had difficulty urinating. Patient complains of lower abdominal pain with some nausea. He's had persistent cough since being discharged from the hospital for treatment record pneumonia. Patient is drowsy but arousable. Level V caveat applies. Past Medical History  Diagnosis Date  . CAD (coronary artery disease)     a. s/p CABG 1995;  b. NSTEMI & subsequent BMS to SVG->right PDA 02/03/12.;  c. Cath 02/14/2012 3vd with 4/4 patent grafts and patent stent in vg->pda;  d. 10/2014 PCI/DES to the VG->PDA.  . Carotid stenosis     a. Carotid dopplers [id number]: RICA [id number], LICA [id number];  b. dopplers 3/14:  123456 RICA, [id number] LICA  . HTN (hypertension)   . HLD (hyperlipidemia)   . Asthmatic bronchitis   . BPH (benign prostatic hypertrophy)   . Herpes zoster   . Anemia   . Thrombocytopenia ([place])     a. secondary to splenomegaly related to lymphoma with suspicion of bone marrow involvement. (Plavix had to be stopped due to this)  . Aortic stenosis, severe     a. ECHO 07/22/14 Severe AS. Peak and mean gradients of 57 mmHg and 42 mmHg, respectively. Calculated AVA is 0.8-0.9 cm2. Trivial AR. Valve area (VTI): 0.84 cm^2. Valve area (Vmax): 0.86 cm^2.. Aortic root dimension: 42 mm (ED) and aortic root mildly dilated;  b. 10/2014 S/P TAVR w/ Edwards Sapien 3 THV 90mm;  c. 10/2014 Echo: EF 50%, nl fxning AoV, mean grad of 9, peak of 16.  Marland Kitchen Splenomegaly   . COPD (chronic obstructive pulmonary disease) ([place])   . Peripheral vascular disease ([place])     a. s/p L ray amp 10/13;  b. s/p L 2nd toe amp 12/13  . Pulmonary nodule     a. 50mm  RUL calcified pulm nodule noted on CT staging for lymphoma - stable 10/2012.  . Leg DVT (deep venous thromboembolism), chronic ([place])     a. LE dopplers 5/13, 10/13 and 1/14: chronic DVT involving right mid femoral vein, left mid femoral vein, and left popliteal vein.  . Osteomyelitis ([place])     a. L first foot ray amputation 09/2012  . Heart murmur   . Anginal pain ([place])   . Myocardial infarction [facility]) ?1995  . Pneumonia ?2014  . DM2 (diabetes mellitus, type 2) ([place])   . DJD (degenerative joint disease)   . 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 Stage III s/p bendamustine, Rituxan therapy  . 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 29 mm Edwards Sapien 3 transcatheter heart valve placed via open right transfemoral approach   Past Surgical History  Procedure Laterality Date  . Coronary artery bypass graft  1995  . Shoulder arthroscopy w/ rotator cuff repair Right   . Carpal tunnel release Bilateral   . Incisional hernia repair    . Kidney surgery      "cut me 1/2 in 2 for stones"  . Back surgery    . I&d extremity  09/16/2012    Procedure: IRRIGATION AND DEBRIDEMENT EXTREMITY;  Surgeon: Wylene Simmer, MD;  Location: [facility];  Service: Orthopedics;  Laterality: Left;  irrigation and debridement and first Ray amputation of left foot  . Amputation  09/16/2012    Procedure: AMPUTATION RAY;  Surgeon: Wylene Simmer, MD;  Location: [facility];  Service: Orthopedics;  Laterality: Left;  first Ray amputation left foot  . Amputation  11/21/2012    Procedure: AMPUTATION RAY;  Surgeon: Wylene Simmer, MD;  Location: [place];  Service: Orthopedics;  Laterality: Left;  LEFT SECOND TOE AMPUTATION  . I&d extremity Left 03/01/2013    Procedure: IRRIGATION AND DEBRIDEMENT EXTREMITY;  Surgeon: Wylene Simmer, MD;  Location: [facility];  Service: Orthopedics;  Laterality: Left;  IRRIGATION  AND  DEBRIDEMENT  LEFT  FOOT  . Amputation Left 12/20/2013    Procedure: AMPUTATION  LEFT 5TH TRANSMETATARSAL;  Surgeon: Wylene Simmer, MD;  Location: [place];  Service: Orthopedics;  Laterality: Left;  . Achilles tendon lengthening Left 12/20/2013    Procedure: ACHILLES TENDON LENGTHENING;  Surgeon: Wylene Simmer, MD;  Location: [place];  Service: Orthopedics;  Laterality: Left;  . Hernia repair    . Coronary angioplasty with stent placement  09/2014; 10/14/2014    "?2; 1"  . Cataract extraction w/ intraocular lens  implant, bilateral Bilateral   . Transcatheter aortic valve replacement, transfemoral N/A 11/05/2014    Procedure: TRANSCATHETER AORTIC VALVE REPLACEMENT, TRANSFEMORAL;  Surgeon: [name], MD;  Location: [facility] OR; Berniece Pap 3 THV (size 29 mm, model # F048547, serial # [id number])   . Intraoperative transesophageal echocardiogram N/A 11/05/2014    Procedure: INTRAOPERATIVE TRANSESOPHAGEAL ECHOCARDIOGRAM;  Surgeon: [name], MD;  [facility];  Service: Open Heart Surgery;  Laterality: N/A;  . Left and right heart catheterization with coronary/graft angiogram N/A 02/03/2012    Procedure: LEFT AND RIGHT HEART CATHETERIZATION WITH Beatrix Fetters;  Surgeon: [name], MD;  Location: [facility] CATH LAB;  Service: Cardiovascular;  Laterality: N/A;  . Percutaneous stent intervention  02/03/2012    Procedure: PERCUTANEOUS STENT INTERVENTION;  Surgeon: [name], MD;  Location[facility] CATH LAB;  Service: Cardiovascular;;  . Left heart catheterization with coronary/graft angiogram N/A 02/14/2012    Procedure: LEFT HEART CATHETERIZATION WITH Beatrix Fetters;  Surgeon: Peter M Martinique, MD;  Location: [facility] CATH LAB;  Service: Cardiovascular;  Laterality: N/A;  . Left and right heart catheterization with coronary/graft angiogram N/A 09/23/2014    Procedure: LEFT AND RIGHT HEART CATHETERIZATION WITH Beatrix Fetters;  Surgeon: Blane Ohara, MD;  Location: [facility] CATH LAB;  Service: Cardiovascular;  Laterality: N/A;  . Percutaneous coronary stent intervention  (pci-s)  10/14/2014    Procedure: PERCUTANEOUS CORONARY STENT INTERVENTION (PCI-S);  Surgeon: Blane Ohara, MD;  Location: [facility] CATH LAB;  Service: Cardiovascular;;   Family History  Problem Relation Age of Onset  . Coronary artery disease    . Alcohol abuse    . Other Mother   . Other Father    Social History  Substance Use Topics  . Smoking status: Former Smoker -- 1.00 packs/day for 30 years    Types: Cigarettes, Pipe    Quit date: 03/29/1985  . Smokeless tobacco: Former Systems developer    Types: Chew     Comment: "chewed for ~ 1 yr after I quit smoking"  . Alcohol Use: No    Review of Systems  Constitutional: Positive for fever, activity change, appetite change and fatigue.  Respiratory: Positive for cough and shortness of breath.   Gastrointestinal: Positive for nausea, abdominal pain and abdominal distention. Negative for vomiting and diarrhea.  Genitourinary: Positive for  difficulty urinating.  Musculoskeletal: Negative for back pain and neck pain.  Skin: Negative for rash.  Neurological: Positive for weakness. Negative for headaches.  Psychiatric/Behavioral: Positive for confusion.      Allergies  Losartan; Azithromycin; Contrast media; Doxycycline; Keflex; Macrodantin; Minocycline hcl; Nitrofurantoin; and Zithromax  Home Medications   Prior to Admission medications   Medication Sig Start Date End Date Taking? Authorizing Provider  ANORO ELLIPTA 62.5-25 MCG/INH AEPB Inhale 1 puff into the lungs daily.  02/12/16  Yes Historical Provider, MD  aspirin EC 81 MG tablet Take 81 mg by mouth every morning.    Yes Historical Provider, MD  clopidogrel (PLAVIX) 75 MG tablet TAKE ONE TABLET BY MOUTH ONCE DAILY Patient taking differently: TAKE 75 MG BY MOUTH ONCE DAILY 01/07/16  Yes [name], MD  feeding supplement, ENSURE ENLIVE, (ENSURE ENLIVE) LIQD Take 237 mLs by mouth 3 (three) times daily between meals. 03/15/16  Yes Modena Jansky, MD  gabapentin (NEURONTIN) 300 MG capsule  Take 300 mg by mouth 2 (two) times daily.  03/03/13  Yes Haywood Pao, MD  hydrOXYzine (ATARAX/VISTARIL) 25 MG tablet Take 25 mg by mouth every 6 (six) hours as needed for itching.   Yes Historical Provider, MD  isosorbide mononitrate (IMDUR) 30 MG 24 hr tablet TAKE ONE TABLET BY MOUTH ONCE DAILY Patient taking differently: TAKE 30 MG BY MOUTH ONCE DAILY 01/26/16  Yes [name], MD  metFORMIN (GLUCOPHAGE) 500 MG tablet Take 500 mg by mouth 2 (two) times daily with a meal.   Yes Historical Provider, MD  nitroGLYCERIN (NITROSTAT) 0.4 MG SL tablet Place 1 tablet (0.4 mg total) under the tongue every 5 (five) minutes as needed for chest pain. 11/21/15  Yes [name], MD  pravastatin (PRAVACHOL) 40 MG tablet Take 40 mg by mouth daily.   Yes Historical Provider, MD  traMADol (ULTRAM) 50 MG tablet Take 50 mg by mouth 2 (two) times daily.   Yes Historical Provider, MD  vitamin B-12 (CYANOCOBALAMIN) 500 MCG tablet Take 1,000 mcg by mouth daily.    Yes Historical Provider, MD  levofloxacin (LEVAQUIN) 750 MG tablet Take 1 tablet (750 mg total) by mouth daily. Patient not taking: Reported on 04/07/2016 03/15/16   Modena Jansky, MD   BP 143/81 mmHg  Pulse 84  Temp(Src) 97.6 F (36.4 C) (Oral)  Resp 20  Ht 6' (1.829 m)  Wt 190 lb 4.1 oz (86.3 kg)  BMI 25.80 kg/m2  SpO2 100% Physical Exam  Constitutional: He appears well-developed and well-nourished. No distress.  Sitting in bed with eyes closed.  HENT:  Head: Normocephalic and atraumatic.  Mouth/Throat: Oropharynx is clear and moist.  Eyes: EOM are normal. Pupils are equal, round, and reactive to light.  Neck: Normal range of motion. Neck supple.  No meningismus  Cardiovascular:  Irregularly irregular. Tachycardia.  Pulmonary/Chest: No respiratory distress. He has no wheezes. He has rales.  Increased respiratory effort with scattered rales  Abdominal: Soft. Bowel sounds are normal. He exhibits distension. There is tenderness.    Patient has suprapubic fullness and tenderness to palpation. No rebound or guarding.  Musculoskeletal: Normal range of motion. He exhibits no edema or tenderness.  No lower extremity asymmetry, tenderness or swelling. Partial left foot amputation  Neurological:  Drowsy but arousable. Moving all extremities. Sensation is grossly intact.  Skin: Skin is warm and dry. No rash noted. No erythema.  Nursing note and vitals reviewed.   ED Course  Procedures (including critical care time) Labs Review  Labs Reviewed  CULTURE, BLOOD (ROUTINE X 2) - Abnormal; Notable for the following:    Culture ENTEROCOCCUS SPECIES (*)    All other components within normal limits  CULTURE, BLOOD (ROUTINE X 2) - Abnormal; Notable for the following:    Culture   (*)    Value: ENTEROCOCCUS SPECIES SUSCEPTIBILITIES PERFORMED ON PREVIOUS CULTURE WITHIN THE LAST 5 DAYS. Performed at [facility]    All other components within normal limits  URINE CULTURE - Abnormal; Notable for the following:    Culture >=100,000 COLONIES/mL ENTEROCOCCUS SPECIES (*)    Organism ID, Bacteria ENTEROCOCCUS SPECIES (*)    All other components within normal limits  BLOOD CULTURE ID PANEL (REFLEXED) - Abnormal; Notable for the following:    Enterococcus species DETECTED (*)    All other components within normal limits  COMPREHENSIVE METABOLIC PANEL - Abnormal; Notable for the following:    Sodium 129 (*)    Chloride 97 (*)    CO2 21 (*)    Glucose, Bld 188 (*)    BUN 27 (*)    Total Protein 5.7 (*)    AST 50 (*)    Alkaline Phosphatase 221 (*)    Total Bilirubin 1.9 (*)    GFR calc non Af Amer 52 (*)    All other components within normal limits  CBC WITH DIFFERENTIAL/PLATELET - Abnormal; Notable for the following:    WBC 12.9 (*)    RBC 3.42 (*)    Hemoglobin 9.2 (*)    HCT 27.7 (*)    RDW 16.9 (*)    Platelets 74 (*)    Neutro Abs 11.0 (*)    Monocytes Absolute 1.1 (*)    All other components within normal limits   URINALYSIS, ROUTINE W REFLEX MICROSCOPIC (NOT AT [facility]) - Abnormal; Notable for the following:    Color, Urine AMBER (*)    APPearance CLOUDY (*)    Protein, ur 100 (*)    All other components within normal limits  PROTIME-INR - Abnormal; Notable for the following:    Prothrombin Time 16.5 (*)    All other components within normal limits  URINE MICROSCOPIC-ADD ON - Abnormal; Notable for the following:    Squamous Epithelial / LPF 0-5 (*)    All other components within normal limits  LACTIC ACID, PLASMA - Abnormal; Notable for the following:    Lactic Acid, Venous 2.4 (*)    All other components within normal limits  TROPONIN I - Abnormal; Notable for the following:    Troponin I 1.29 (*)    All other components within normal limits  TROPONIN I - Abnormal; Notable for the following:    Troponin I 1.27 (*)    All other components within normal limits  TROPONIN I - Abnormal; Notable for the following:    Troponin I 1.56 (*)    All other components within normal limits  CBC WITH DIFFERENTIAL/PLATELET - Abnormal; Notable for the following:    WBC 15.4 (*)    RBC 3.46 (*)    Hemoglobin 9.4 (*)    HCT 28.6 (*)    RDW 17.3 (*)    Platelets 60 (*)    Neutro Abs 13.1 (*)    Monocytes Absolute 1.5 (*)    All other components within normal limits  COMPREHENSIVE METABOLIC PANEL - Abnormal; Notable for the following:    Sodium 130 (*)    CO2 18 (*)    Glucose, Bld 195 (*)    BUN  28 (*)    Calcium 8.6 (*)    Total Protein 5.1 (*)    Albumin 3.2 (*)    AST 73 (*)    Alkaline Phosphatase 220 (*)    Total Bilirubin 2.2 (*)    GFR calc non Af Amer 56 (*)    All other components within normal limits  LACTIC ACID, PLASMA - Abnormal; Notable for the following:    Lactic Acid, Venous 2.2 (*)    All other components within normal limits  GLUCOSE, CAPILLARY - Abnormal; Notable for the following:    Glucose-Capillary 136 (*)    All other components within normal limits  GLUCOSE, CAPILLARY  - Abnormal; Notable for the following:    Glucose-Capillary 138 (*)    All other components within normal limits  COMPREHENSIVE METABOLIC PANEL - Abnormal; Notable for the following:    Sodium 132 (*)    CO2 17 (*)    Glucose, Bld 117 (*)    BUN 32 (*)    Calcium 8.0 (*)    Total Protein 4.8 (*)    Albumin 2.9 (*)    AST 56 (*)    Alkaline Phosphatase 192 (*)    Total Bilirubin 1.7 (*)    GFR calc non Af Amer 51 (*)    GFR calc Af Amer 59 (*)    All other components within normal limits  ALBUMIN - Abnormal; Notable for the following:    Albumin 2.9 (*)    All other components within normal limits  PROTEIN, TOTAL - Abnormal; Notable for the following:    Total Protein 4.9 (*)    All other components within normal limits  GLUCOSE, CAPILLARY - Abnormal; Notable for the following:    Glucose-Capillary 120 (*)    All other components within normal limits  CBC - Abnormal; Notable for the following:    RBC 3.58 (*)    Hemoglobin 9.9 (*)    HCT 29.7 (*)    RDW 17.4 (*)    Platelets 58 (*)    All other components within normal limits  COMPREHENSIVE METABOLIC PANEL - Abnormal; Notable for the following:    Sodium 130 (*)    CO2 14 (*)    Glucose, Bld 171 (*)    BUN 38 (*)    Creatinine, Ser 1.27 (*)    Calcium 8.3 (*)    Total Protein 5.1 (*)    Albumin 3.0 (*)    AST 62 (*)    Alkaline Phosphatase 200 (*)    Total Bilirubin 1.7 (*)    GFR calc non Af Amer 49 (*)    GFR calc Af Amer 57 (*)    All other components within normal limits  BILIRUBIN, DIRECT - Abnormal; Notable for the following:    Bilirubin, Direct 0.9 (*)    All other components within normal limits  TROPONIN I - Abnormal; Notable for the following:    Troponin I 1.19 (*)    All other components within normal limits  GLUCOSE, CAPILLARY - Abnormal; Notable for the following:    Glucose-Capillary 108 (*)    All other components within normal limits  GLUCOSE, CAPILLARY - Abnormal; Notable for the following:     Glucose-Capillary 196 (*)    All other components within normal limits  LACTATE DEHYDROGENASE, BODY FLUID - Abnormal; Notable for the following:    LD, Fluid 46 (*)    All other components within normal limits  BODY FLUID CELL COUNT WITH DIFFERENTIAL -  Abnormal; Notable for the following:    Color, Fluid STRAW (*)    Appearance, Fluid HAZY (*)    Neutrophil Count, Fluid 72 (*)    Monocyte-Macrophage-Serous Fluid 19 (*)    All other components within normal limits  GLUCOSE, CAPILLARY - Abnormal; Notable for the following:    Glucose-Capillary 246 (*)    All other components within normal limits  BRAIN NATRIURETIC PEPTIDE - Abnormal; Notable for the following:    B Natriuretic Peptide 3365.9 (*)    All other components within normal limits  CBC - Abnormal; Notable for the following:    WBC 11.1 (*)    RBC 3.48 (*)    Hemoglobin 9.6 (*)    HCT 28.4 (*)    RDW 17.5 (*)    Platelets 59 (*)    All other components within normal limits  GLUCOSE, CAPILLARY - Abnormal; Notable for the following:    Glucose-Capillary 220 (*)    All other components within normal limits  GLUCOSE, CAPILLARY - Abnormal; Notable for the following:    Glucose-Capillary 157 (*)    All other components within normal limits  GLUCOSE, CAPILLARY - Abnormal; Notable for the following:    Glucose-Capillary 191 (*)    All other components within normal limits  GLUCOSE, CAPILLARY - Abnormal; Notable for the following:    Glucose-Capillary 252 (*)    All other components within normal limits  CBC - Abnormal; Notable for the following:    RBC 3.45 (*)    Hemoglobin 9.4 (*)    HCT 28.2 (*)    RDW 17.5 (*)    Platelets 71 (*)    All other components within normal limits  COMPREHENSIVE METABOLIC PANEL - Abnormal; Notable for the following:    CO2 21 (*)    Glucose, Bld 150 (*)    BUN 58 (*)    Creatinine, Ser 1.42 (*)    Calcium 8.7 (*)    Total Protein 4.9 (*)    Albumin 3.1 (*)    AST 47 (*)    Alkaline  Phosphatase 172 (*)    GFR calc non Af Amer 43 (*)    GFR calc Af Amer 50 (*)    All other components within normal limits  PROTIME-INR - Abnormal; Notable for the following:    Prothrombin Time 16.3 (*)    All other components within normal limits  GLUCOSE, CAPILLARY - Abnormal; Notable for the following:    Glucose-Capillary 176 (*)    All other components within normal limits  GLUCOSE, CAPILLARY - Abnormal; Notable for the following:    Glucose-Capillary 149 (*)    All other components within normal limits  GLUCOSE, CAPILLARY - Abnormal; Notable for the following:    Glucose-Capillary 175 (*)    All other components within normal limits  GLUCOSE, CAPILLARY - Abnormal; Notable for the following:    Glucose-Capillary 197 (*)    All other components within normal limits  GLUCOSE, CAPILLARY - Abnormal; Notable for the following:    Glucose-Capillary 177 (*)    All other components within normal limits  COMPREHENSIVE METABOLIC PANEL - Abnormal; Notable for the following:    Potassium 3.3 (*)    Glucose, Bld 133 (*)    BUN 55 (*)    Creatinine, Ser 1.25 (*)    Calcium 8.8 (*)    Total Protein 5.1 (*)    Albumin 3.1 (*)    AST 45 (*)    Alkaline Phosphatase 160 (*)  GFR calc non Af Amer 50 (*)    GFR calc Af Amer 58 (*)    All other components within normal limits  GLUCOSE, CAPILLARY - Abnormal; Notable for the following:    Glucose-Capillary 161 (*)    All other components within normal limits  GLUCOSE, CAPILLARY - Abnormal; Notable for the following:    Glucose-Capillary 147 (*)    All other components within normal limits  I-STAT CG4 LACTIC ACID, ED - Abnormal; Notable for the following:    Lactic Acid, Venous 2.49 (*)    All other components within normal limits  MRSA PCR SCREENING  CULTURE, BODY FLUID-BOTTLE  GRAM STAIN  CULTURE, BLOOD (ROUTINE X 2)  CULTURE, BLOOD (ROUTINE X 2)  CULTURE, EXPECTORATED SPUTUM-ASSESSMENT  BODY FLUID CULTURE  APTT  PROCALCITONIN   LACTIC ACID, PLASMA  HEPARIN LEVEL (UNFRACTIONATED)  LACTIC ACID, PLASMA  LACTATE DEHYDROGENASE  LACTIC ACID, PLASMA  ALBUMIN, FLUID  PROTEIN, BODY FLUID  GLUCOSE, SEROUS FLUID  PROCALCITONIN  HIV 1 RNA QUANT-NO REFLEX-BLD  PROCALCITONIN  PH, BODY FLUID  CYTOLOGY - NON PAP  CYTOLOGY - NON PAP    Imaging Review No results found. I have personally reviewed and evaluated these images and lab results as part of my medical decision-making.   EKG Interpretation   Date/Time:  Wednesday Apr 07 2016 02:30:14 EDT Ventricular Rate:  114 PR Interval:    QRS Duration: 142 QT Interval:  371 QTC Calculation: 511 R Axis:   -77 Text Interpretation:  Atrial fibrillation Nonspecific IVCD with LAD  Anterior infarct, old No acute changes Inverted T waves have replaced  nonspecific T wave abnormality in Confirmed by Kathrynn Humble, MD, Thelma Comp [id number])  on 04/09/2016 9:38:11 AM      MDM   Final diagnoses:  Fever, unspecified fever cause    Concern for sepsis. We'll start broad-spectrum antibiotics and IV fluids.  Care assumed by Dr. Delos Haring, MD 04/11/16 1054

## 2016-04-06 NOTE — ED Notes (Signed)
Bed: WA06 Expected date:  Expected time:  Means of arrival:  Comments: EMS 82 yo male with fever and lower abdominal and groin pain, difficulty urinating

## 2016-04-07 ENCOUNTER — Encounter (HOSPITAL_COMMUNITY): Payer: Self-pay | Admitting: Internal Medicine

## 2016-04-07 ENCOUNTER — Inpatient Hospital Stay (HOSPITAL_COMMUNITY): Payer: Medicare Other

## 2016-04-07 DIAGNOSIS — J45909 Unspecified asthma, uncomplicated: Secondary | ICD-10-CM | POA: Diagnosis present

## 2016-04-07 DIAGNOSIS — R509 Fever, unspecified: Secondary | ICD-10-CM | POA: Insufficient documentation

## 2016-04-07 DIAGNOSIS — Z79891 Long term (current) use of opiate analgesic: Secondary | ICD-10-CM | POA: Diagnosis not present

## 2016-04-07 DIAGNOSIS — E119 Type 2 diabetes mellitus without complications: Secondary | ICD-10-CM | POA: Diagnosis not present

## 2016-04-07 DIAGNOSIS — E1151 Type 2 diabetes mellitus with diabetic peripheral angiopathy without gangrene: Secondary | ICD-10-CM | POA: Diagnosis present

## 2016-04-07 DIAGNOSIS — Z8249 Family history of ischemic heart disease and other diseases of the circulatory system: Secondary | ICD-10-CM | POA: Diagnosis not present

## 2016-04-07 DIAGNOSIS — Z79899 Other long term (current) drug therapy: Secondary | ICD-10-CM | POA: Diagnosis not present

## 2016-04-07 DIAGNOSIS — Z9841 Cataract extraction status, right eye: Secondary | ICD-10-CM | POA: Diagnosis not present

## 2016-04-07 DIAGNOSIS — J449 Chronic obstructive pulmonary disease, unspecified: Secondary | ICD-10-CM | POA: Diagnosis present

## 2016-04-07 DIAGNOSIS — C8310 Mantle cell lymphoma, unspecified site: Secondary | ICD-10-CM | POA: Diagnosis present

## 2016-04-07 DIAGNOSIS — E1159 Type 2 diabetes mellitus with other circulatory complications: Secondary | ICD-10-CM

## 2016-04-07 DIAGNOSIS — R6510 Systemic inflammatory response syndrome (SIRS) of non-infectious origin without acute organ dysfunction: Secondary | ICD-10-CM | POA: Diagnosis not present

## 2016-04-07 DIAGNOSIS — R748 Abnormal levels of other serum enzymes: Secondary | ICD-10-CM | POA: Diagnosis present

## 2016-04-07 DIAGNOSIS — R161 Splenomegaly, not elsewhere classified: Secondary | ICD-10-CM

## 2016-04-07 DIAGNOSIS — I509 Heart failure, unspecified: Secondary | ICD-10-CM | POA: Diagnosis not present

## 2016-04-07 DIAGNOSIS — R627 Adult failure to thrive: Secondary | ICD-10-CM | POA: Diagnosis present

## 2016-04-07 DIAGNOSIS — R188 Other ascites: Secondary | ICD-10-CM

## 2016-04-07 DIAGNOSIS — A419 Sepsis, unspecified organism: Secondary | ICD-10-CM | POA: Diagnosis not present

## 2016-04-07 DIAGNOSIS — N183 Chronic kidney disease, stage 3 (moderate): Secondary | ICD-10-CM | POA: Diagnosis present

## 2016-04-07 DIAGNOSIS — Z881 Allergy status to other antibiotic agents status: Secondary | ICD-10-CM | POA: Diagnosis not present

## 2016-04-07 DIAGNOSIS — Y831 Surgical operation with implant of artificial internal device as the cause of abnormal reaction of the patient, or of later complication, without mention of misadventure at the time of the procedure: Secondary | ICD-10-CM | POA: Diagnosis present

## 2016-04-07 DIAGNOSIS — Z9842 Cataract extraction status, left eye: Secondary | ICD-10-CM | POA: Diagnosis not present

## 2016-04-07 DIAGNOSIS — N400 Enlarged prostate without lower urinary tract symptoms: Secondary | ICD-10-CM | POA: Diagnosis present

## 2016-04-07 DIAGNOSIS — R911 Solitary pulmonary nodule: Secondary | ICD-10-CM | POA: Diagnosis present

## 2016-04-07 DIAGNOSIS — G9340 Encephalopathy, unspecified: Secondary | ICD-10-CM | POA: Diagnosis present

## 2016-04-07 DIAGNOSIS — J189 Pneumonia, unspecified organism: Secondary | ICD-10-CM | POA: Diagnosis present

## 2016-04-07 DIAGNOSIS — I493 Ventricular premature depolarization: Secondary | ICD-10-CM | POA: Diagnosis present

## 2016-04-07 DIAGNOSIS — I2510 Atherosclerotic heart disease of native coronary artery without angina pectoris: Secondary | ICD-10-CM | POA: Diagnosis present

## 2016-04-07 DIAGNOSIS — I129 Hypertensive chronic kidney disease with stage 1 through stage 4 chronic kidney disease, or unspecified chronic kidney disease: Secondary | ICD-10-CM | POA: Diagnosis present

## 2016-04-07 DIAGNOSIS — R7989 Other specified abnormal findings of blood chemistry: Secondary | ICD-10-CM

## 2016-04-07 DIAGNOSIS — Z7984 Long term (current) use of oral hypoglycemic drugs: Secondary | ICD-10-CM | POA: Diagnosis not present

## 2016-04-07 DIAGNOSIS — R6521 Severe sepsis with septic shock: Secondary | ICD-10-CM | POA: Diagnosis present

## 2016-04-07 DIAGNOSIS — K7460 Unspecified cirrhosis of liver: Secondary | ICD-10-CM | POA: Diagnosis present

## 2016-04-07 DIAGNOSIS — D72829 Elevated white blood cell count, unspecified: Secondary | ICD-10-CM

## 2016-04-07 DIAGNOSIS — K769 Liver disease, unspecified: Secondary | ICD-10-CM

## 2016-04-07 DIAGNOSIS — I130 Hypertensive heart and chronic kidney disease with heart failure and stage 1 through stage 4 chronic kidney disease, or unspecified chronic kidney disease: Secondary | ICD-10-CM | POA: Diagnosis present

## 2016-04-07 DIAGNOSIS — Z8701 Personal history of pneumonia (recurrent): Secondary | ICD-10-CM | POA: Diagnosis not present

## 2016-04-07 DIAGNOSIS — E11621 Type 2 diabetes mellitus with foot ulcer: Secondary | ICD-10-CM | POA: Diagnosis present

## 2016-04-07 DIAGNOSIS — A4181 Sepsis due to Enterococcus: Secondary | ICD-10-CM | POA: Diagnosis present

## 2016-04-07 DIAGNOSIS — L97509 Non-pressure chronic ulcer of other part of unspecified foot with unspecified severity: Secondary | ICD-10-CM | POA: Diagnosis present

## 2016-04-07 DIAGNOSIS — J90 Pleural effusion, not elsewhere classified: Secondary | ICD-10-CM | POA: Diagnosis present

## 2016-04-07 DIAGNOSIS — C8317 Mantle cell lymphoma, spleen: Secondary | ICD-10-CM

## 2016-04-07 DIAGNOSIS — E1122 Type 2 diabetes mellitus with diabetic chronic kidney disease: Secondary | ICD-10-CM | POA: Diagnosis present

## 2016-04-07 DIAGNOSIS — Z7901 Long term (current) use of anticoagulants: Secondary | ICD-10-CM | POA: Diagnosis not present

## 2016-04-07 DIAGNOSIS — J440 Chronic obstructive pulmonary disease with acute lower respiratory infection: Secondary | ICD-10-CM | POA: Diagnosis present

## 2016-04-07 DIAGNOSIS — E785 Hyperlipidemia, unspecified: Secondary | ICD-10-CM | POA: Diagnosis present

## 2016-04-07 DIAGNOSIS — D638 Anemia in other chronic diseases classified elsewhere: Secondary | ICD-10-CM | POA: Diagnosis present

## 2016-04-07 DIAGNOSIS — R000 Tachycardia, unspecified: Secondary | ICD-10-CM | POA: Diagnosis present

## 2016-04-07 DIAGNOSIS — Z7902 Long term (current) use of antithrombotics/antiplatelets: Secondary | ICD-10-CM | POA: Diagnosis not present

## 2016-04-07 DIAGNOSIS — Z888 Allergy status to other drugs, medicaments and biological substances status: Secondary | ICD-10-CM | POA: Diagnosis not present

## 2016-04-07 DIAGNOSIS — Z7982 Long term (current) use of aspirin: Secondary | ICD-10-CM | POA: Diagnosis not present

## 2016-04-07 DIAGNOSIS — N179 Acute kidney failure, unspecified: Secondary | ICD-10-CM | POA: Diagnosis present

## 2016-04-07 DIAGNOSIS — E118 Type 2 diabetes mellitus with unspecified complications: Secondary | ICD-10-CM | POA: Diagnosis not present

## 2016-04-07 DIAGNOSIS — D696 Thrombocytopenia, unspecified: Secondary | ICD-10-CM | POA: Diagnosis present

## 2016-04-07 DIAGNOSIS — I4891 Unspecified atrial fibrillation: Secondary | ICD-10-CM | POA: Diagnosis present

## 2016-04-07 DIAGNOSIS — L8990 Pressure ulcer of unspecified site, unspecified stage: Secondary | ICD-10-CM | POA: Insufficient documentation

## 2016-04-07 DIAGNOSIS — T826XXA Infection and inflammatory reaction due to cardiac valve prosthesis, initial encounter: Secondary | ICD-10-CM | POA: Diagnosis present

## 2016-04-07 DIAGNOSIS — I480 Paroxysmal atrial fibrillation: Secondary | ICD-10-CM | POA: Diagnosis not present

## 2016-04-07 DIAGNOSIS — Z91041 Radiographic dye allergy status: Secondary | ICD-10-CM | POA: Diagnosis not present

## 2016-04-07 DIAGNOSIS — N390 Urinary tract infection, site not specified: Secondary | ICD-10-CM | POA: Diagnosis present

## 2016-04-07 DIAGNOSIS — Z8572 Personal history of non-Hodgkin lymphomas: Secondary | ICD-10-CM

## 2016-04-07 DIAGNOSIS — I5021 Acute systolic (congestive) heart failure: Secondary | ICD-10-CM | POA: Diagnosis not present

## 2016-04-07 DIAGNOSIS — I330 Acute and subacute infective endocarditis: Secondary | ICD-10-CM | POA: Diagnosis present

## 2016-04-07 DIAGNOSIS — I252 Old myocardial infarction: Secondary | ICD-10-CM | POA: Diagnosis not present

## 2016-04-07 DIAGNOSIS — Z961 Presence of intraocular lens: Secondary | ICD-10-CM | POA: Diagnosis present

## 2016-04-07 DIAGNOSIS — Z87891 Personal history of nicotine dependence: Secondary | ICD-10-CM | POA: Diagnosis not present

## 2016-04-07 DIAGNOSIS — Z954 Presence of other heart-valve replacement: Secondary | ICD-10-CM | POA: Diagnosis not present

## 2016-04-07 DIAGNOSIS — Z66 Do not resuscitate: Secondary | ICD-10-CM | POA: Diagnosis present

## 2016-04-07 DIAGNOSIS — I10 Essential (primary) hypertension: Secondary | ICD-10-CM | POA: Diagnosis not present

## 2016-04-07 LAB — URINE MICROSCOPIC-ADD ON
BACTERIA UA: NONE SEEN
RBC / HPF: NONE SEEN RBC/hpf (ref 0–5)
WBC, UA: NONE SEEN WBC/hpf (ref 0–5)

## 2016-04-07 LAB — CBC WITH DIFFERENTIAL/PLATELET
Basophils Absolute: 0 10*3/uL (ref 0.0–0.1)
Basophils Absolute: 0 10*3/uL (ref 0.0–0.1)
Basophils Relative: 0 %
Basophils Relative: 0 %
EOS ABS: 0 10*3/uL (ref 0.0–0.7)
EOS PCT: 0 %
EOS PCT: 0 %
Eosinophils Absolute: 0 10*3/uL (ref 0.0–0.7)
HCT: 27.7 % — ABNORMAL LOW (ref 39.0–52.0)
HEMATOCRIT: 28.6 % — AB (ref 39.0–52.0)
Hemoglobin: 9.2 g/dL — ABNORMAL LOW (ref 13.0–17.0)
Hemoglobin: 9.4 g/dL — ABNORMAL LOW (ref 13.0–17.0)
LYMPHS ABS: 0.8 10*3/uL (ref 0.7–4.0)
LYMPHS ABS: 0.8 10*3/uL (ref 0.7–4.0)
LYMPHS PCT: 6 %
Lymphocytes Relative: 5 %
MCH: 26.9 pg (ref 26.0–34.0)
MCH: 27.2 pg (ref 26.0–34.0)
MCHC: 32.9 g/dL (ref 30.0–36.0)
MCHC: 33.2 g/dL (ref 30.0–36.0)
MCV: 81 fL (ref 78.0–100.0)
MCV: 82.7 fL (ref 78.0–100.0)
MONO ABS: 1.1 10*3/uL — AB (ref 0.1–1.0)
MONOS PCT: 10 %
Monocytes Absolute: 1.5 10*3/uL — ABNORMAL HIGH (ref 0.1–1.0)
Monocytes Relative: 8 %
NEUTROS ABS: 11 10*3/uL — AB (ref 1.7–7.7)
Neutro Abs: 13.1 10*3/uL — ABNORMAL HIGH (ref 1.7–7.7)
Neutrophils Relative %: 85 %
Neutrophils Relative %: 85 %
PLATELETS: 74 10*3/uL — AB (ref 150–400)
Platelets: 60 10*3/uL — ABNORMAL LOW (ref 150–400)
RBC: 3.42 MIL/uL — AB (ref 4.22–5.81)
RBC: 3.46 MIL/uL — AB (ref 4.22–5.81)
RDW: 16.9 % — AB (ref 11.5–15.5)
RDW: 17.3 % — AB (ref 11.5–15.5)
WBC: 12.9 10*3/uL — AB (ref 4.0–10.5)
WBC: 15.4 10*3/uL — AB (ref 4.0–10.5)

## 2016-04-07 LAB — GLUCOSE, CAPILLARY
GLUCOSE-CAPILLARY: 138 mg/dL — AB (ref 65–99)
GLUCOSE-CAPILLARY: 120 mg/dL — AB (ref 65–99)
Glucose-Capillary: 136 mg/dL — ABNORMAL HIGH (ref 65–99)
Glucose-Capillary: 108 mg/dL — ABNORMAL HIGH (ref 65–99)

## 2016-04-07 LAB — BLOOD CULTURE ID PANEL (REFLEXED)
ACINETOBACTER BAUMANNII: NOT DETECTED
CANDIDA GLABRATA: NOT DETECTED
CANDIDA KRUSEI: NOT DETECTED
CARBAPENEM RESISTANCE: NOT DETECTED
Candida albicans: NOT DETECTED
Candida parapsilosis: NOT DETECTED
Candida tropicalis: NOT DETECTED
ENTEROBACTERIACEAE SPECIES: NOT DETECTED
ESCHERICHIA COLI: NOT DETECTED
Enterobacter cloacae complex: NOT DETECTED
Enterococcus species: DETECTED — AB
Haemophilus influenzae: NOT DETECTED
KLEBSIELLA OXYTOCA: NOT DETECTED
Klebsiella pneumoniae: NOT DETECTED
Listeria monocytogenes: NOT DETECTED
Methicillin resistance: NOT DETECTED
NEISSERIA MENINGITIDIS: NOT DETECTED
PSEUDOMONAS AERUGINOSA: NOT DETECTED
Proteus species: NOT DETECTED
STAPHYLOCOCCUS SPECIES: NOT DETECTED
STREPTOCOCCUS AGALACTIAE: NOT DETECTED
STREPTOCOCCUS SPECIES: NOT DETECTED
Serratia marcescens: NOT DETECTED
Staphylococcus aureus (BCID): NOT DETECTED
Streptococcus pneumoniae: NOT DETECTED
Streptococcus pyogenes: NOT DETECTED
Vancomycin resistance: NOT DETECTED

## 2016-04-07 LAB — COMPREHENSIVE METABOLIC PANEL
ALBUMIN: 2.9 g/dL — AB (ref 3.5–5.0)
ALBUMIN: 3.6 g/dL (ref 3.5–5.0)
ALT: 31 U/L (ref 17–63)
ALT: 36 U/L (ref 17–63)
ALT: 37 U/L (ref 17–63)
ANION GAP: 10 (ref 5–15)
AST: 50 U/L — AB (ref 15–41)
AST: 56 U/L — ABNORMAL HIGH (ref 15–41)
AST: 73 U/L — AB (ref 15–41)
Albumin: 3.2 g/dL — ABNORMAL LOW (ref 3.5–5.0)
Alkaline Phosphatase: 192 U/L — ABNORMAL HIGH (ref 38–126)
Alkaline Phosphatase: 220 U/L — ABNORMAL HIGH (ref 38–126)
Alkaline Phosphatase: 221 U/L — ABNORMAL HIGH (ref 38–126)
Anion gap: 10 (ref 5–15)
Anion gap: 11 (ref 5–15)
BILIRUBIN TOTAL: 2.2 mg/dL — AB (ref 0.3–1.2)
BILIRUBIN TOTAL: 1.7 mg/dL — AB (ref 0.3–1.2)
BUN: 27 mg/dL — AB (ref 6–20)
BUN: 28 mg/dL — AB (ref 6–20)
BUN: 32 mg/dL — ABNORMAL HIGH (ref 6–20)
CHLORIDE: 105 mmol/L (ref 101–111)
CHLORIDE: 97 mmol/L — AB (ref 101–111)
CO2: 17 mmol/L — ABNORMAL LOW (ref 22–32)
CO2: 18 mmol/L — ABNORMAL LOW (ref 22–32)
CO2: 21 mmol/L — ABNORMAL LOW (ref 22–32)
CREATININE: 1.15 mg/dL (ref 0.61–1.24)
Calcium: 8 mg/dL — ABNORMAL LOW (ref 8.9–10.3)
Calcium: 8.6 mg/dL — ABNORMAL LOW (ref 8.9–10.3)
Calcium: 9.4 mg/dL (ref 8.9–10.3)
Chloride: 102 mmol/L (ref 101–111)
Creatinine, Ser: 1.21 mg/dL (ref 0.61–1.24)
Creatinine, Ser: 1.23 mg/dL (ref 0.61–1.24)
GFR calc Af Amer: 59 mL/min — ABNORMAL LOW (ref >60)
GFR calc Af Amer: >60 mL/min (ref >60)
GFR calc Af Amer: >60 mL/min (ref >60)
GFR calc non Af Amer: 52 mL/min — ABNORMAL LOW (ref >60)
GFR, EST NON AFRICAN AMERICAN: 56 mL/min — AB (ref >60)
GFR, EST NON AFRICAN AMERICAN: 51 mL/min — AB (ref >60)
GLUCOSE: 188 mg/dL — AB (ref 65–99)
Glucose, Bld: 195 mg/dL — ABNORMAL HIGH (ref 65–99)
Glucose, Bld: 117 mg/dL — ABNORMAL HIGH (ref 65–99)
POTASSIUM: 4.3 mmol/L (ref 3.5–5.1)
POTASSIUM: 4.4 mmol/L (ref 3.5–5.1)
POTASSIUM: 5 mmol/L (ref 3.5–5.1)
SODIUM: 129 mmol/L — AB (ref 135–145)
Sodium: 130 mmol/L — ABNORMAL LOW (ref 135–145)
Sodium: 132 mmol/L — ABNORMAL LOW (ref 135–145)
TOTAL PROTEIN: 5.1 g/dL — AB (ref 6.5–8.1)
TOTAL PROTEIN: 4.8 g/dL — AB (ref 6.5–8.1)
Total Bilirubin: 1.9 mg/dL — ABNORMAL HIGH (ref 0.3–1.2)
Total Protein: 5.7 g/dL — ABNORMAL LOW (ref 6.5–8.1)

## 2016-04-07 LAB — URINALYSIS, ROUTINE W REFLEX MICROSCOPIC
Bilirubin Urine: NEGATIVE
Glucose, UA: NEGATIVE mg/dL
Hgb urine dipstick: NEGATIVE
Ketones, ur: NEGATIVE mg/dL
LEUKOCYTES UA: NEGATIVE
Nitrite: NEGATIVE
PH: 5.5 (ref 5.0–8.0)
Protein, ur: 100 mg/dL — AB
Specific Gravity, Urine: 1.017 (ref 1.005–1.030)

## 2016-04-07 LAB — I-STAT CG4 LACTIC ACID, ED: Lactic Acid, Venous: 2.49 mmol/L (ref 0.5–2.0)

## 2016-04-07 LAB — LACTIC ACID, PLASMA
LACTIC ACID, VENOUS: 1.9 mmol/L (ref 0.5–2.0)
LACTIC ACID, VENOUS: 1.8 mmol/L (ref 0.5–2.0)
Lactic Acid, Venous: 2.4 mmol/L (ref 0.5–2.0)
Lactic Acid, Venous: 2.2 mmol/L (ref 0.5–2.0)

## 2016-04-07 LAB — PROTEIN, TOTAL: TOTAL PROTEIN: 4.9 g/dL — AB (ref 6.5–8.1)

## 2016-04-07 LAB — TROPONIN I
Troponin I: 1.29 ng/mL (ref <0.031)
Troponin I: 1.27 ng/mL (ref <0.031)
Troponin I: 1.56 ng/mL (ref <0.031)

## 2016-04-07 LAB — PROTIME-INR
INR: 1.37 (ref 0.00–1.49)
Prothrombin Time: 16.5 seconds — ABNORMAL HIGH (ref 11.6–15.2)

## 2016-04-07 LAB — ALBUMIN: ALBUMIN: 2.9 g/dL — AB (ref 3.5–5.0)

## 2016-04-07 LAB — PROCALCITONIN: PROCALCITONIN: 2.57 ng/mL

## 2016-04-07 LAB — LACTATE DEHYDROGENASE: LDH: 186 U/L (ref 98–192)

## 2016-04-07 LAB — HEPARIN LEVEL (UNFRACTIONATED): HEPARIN UNFRACTIONATED: 0.39 [IU]/mL (ref 0.30–0.70)

## 2016-04-07 LAB — MRSA PCR SCREENING: MRSA BY PCR: NEGATIVE

## 2016-04-07 LAB — APTT: APTT: 36 s (ref 24–37)

## 2016-04-07 MED ORDER — UMECLIDINIUM-VILANTEROL 62.5-25 MCG/INH IN AEPB
1.0000 | INHALATION_SPRAY | Freq: Every day | RESPIRATORY_TRACT | Status: DC
  Filled 2016-04-07: qty 14

## 2016-04-07 MED ORDER — LORAZEPAM 2 MG/ML IJ SOLN
0.5000 mg | Freq: Once | INTRAMUSCULAR | Status: AC
  Administered 2016-04-07: 0.5 mg via INTRAVENOUS
  Filled 2016-04-07: qty 1

## 2016-04-07 MED ORDER — SODIUM CHLORIDE 0.9 % IV SOLN
INTRAVENOUS | Status: AC
  Administered 2016-04-07: 07:00:00 via INTRAVENOUS

## 2016-04-07 MED ORDER — ENOXAPARIN SODIUM 40 MG/0.4ML ~~LOC~~ SOLN: 40.0000 mg | SUBCUTANEOUS | Status: DC

## 2016-04-07 MED ORDER — HYDROCORTISONE NA SUCCINATE PF 100 MG IJ SOLR: 100.0000 mg | Freq: Three times a day (TID) | INTRAMUSCULAR | Status: DC

## 2016-04-07 MED ORDER — VANCOMYCIN HCL IN DEXTROSE 750-5 MG/150ML-% IV SOLN
750.0000 mg | Freq: Two times a day (BID) | INTRAVENOUS | Status: DC
  Administered 2016-04-07: 750 mg via INTRAVENOUS
  Filled 2016-04-07 (×2): qty 150

## 2016-04-07 MED ORDER — ASPIRIN EC 81 MG PO TBEC
81.0000 mg | DELAYED_RELEASE_TABLET | Freq: Every day | ORAL | Status: DC
  Administered 2016-04-07 – 2016-04-13 (×7): 81 mg via ORAL
  Filled 2016-04-07 (×7): qty 1

## 2016-04-07 MED ORDER — NITROGLYCERIN 0.4 MG SL SUBL: 0.4000 mg | SUBLINGUAL_TABLET | SUBLINGUAL | Status: DC | PRN

## 2016-04-07 MED ORDER — IPRATROPIUM-ALBUTEROL 0.5-2.5 (3) MG/3ML IN SOLN: 3.0000 mL | RESPIRATORY_TRACT | Status: DC | PRN

## 2016-04-07 MED ORDER — CLOPIDOGREL BISULFATE 75 MG PO TABS
75.0000 mg | ORAL_TABLET | Freq: Once | ORAL | Status: AC
  Administered 2016-04-07: 75 mg via ORAL
  Filled 2016-04-07: qty 1

## 2016-04-07 MED ORDER — ISOSORBIDE MONONITRATE ER 60 MG PO TB24
30.0000 mg | ORAL_TABLET | Freq: Every day | ORAL | Status: DC
  Administered 2016-04-07: 30 mg via ORAL
  Filled 2016-04-07: qty 1

## 2016-04-07 MED ORDER — CEFEPIME HCL 2 G IJ SOLR
2.0000 g | Freq: Two times a day (BID) | INTRAMUSCULAR | Status: DC
  Administered 2016-04-07 (×2): 2 g via INTRAVENOUS
  Filled 2016-04-07 (×2): qty 2

## 2016-04-07 MED ORDER — ASPIRIN 81 MG PO CHEW
81.0000 mg | CHEWABLE_TABLET | Freq: Once | ORAL | Status: AC
  Administered 2016-04-07: 81 mg via ORAL
  Filled 2016-04-07: qty 1

## 2016-04-07 MED ORDER — ALBUTEROL SULFATE (2.5 MG/3ML) 0.083% IN NEBU
5.0000 mg | INHALATION_SOLUTION | Freq: Once | RESPIRATORY_TRACT | Status: AC
  Administered 2016-04-07: 5 mg via RESPIRATORY_TRACT
  Filled 2016-04-07: qty 6

## 2016-04-07 MED ORDER — HEPARIN (PORCINE) IN NACL 100-0.45 UNIT/ML-% IJ SOLN
950.0000 [IU]/h | INTRAMUSCULAR | Status: DC
  Administered 2016-04-07: 950 [IU]/h via INTRAVENOUS
  Filled 2016-04-07: qty 250

## 2016-04-07 MED ORDER — HEPARIN BOLUS VIA INFUSION
3000.0000 [IU] | Freq: Once | INTRAVENOUS | Status: AC
  Administered 2016-04-07: 3000 [IU] via INTRAVENOUS
  Filled 2016-04-07: qty 3000

## 2016-04-07 MED ORDER — PHENYLEPHRINE HCL 10 MG/ML IJ SOLN
0.0000 ug/min | INTRAMUSCULAR | Status: DC
  Administered 2016-04-07: 10 ug/min via INTRAVENOUS
  Filled 2016-04-07: qty 1

## 2016-04-07 MED ORDER — INSULIN ASPART 100 UNIT/ML ~~LOC~~ SOLN
0.0000 [IU] | Freq: Three times a day (TID) | SUBCUTANEOUS | Status: DC
  Administered 2016-04-07 (×2): 1 [IU] via SUBCUTANEOUS
  Administered 2016-04-08 (×2): 2 [IU] via SUBCUTANEOUS
  Administered 2016-04-08: 3 [IU] via SUBCUTANEOUS
  Administered 2016-04-09 (×2): 2 [IU] via SUBCUTANEOUS
  Administered 2016-04-09: 5 [IU] via SUBCUTANEOUS
  Administered 2016-04-10 (×3): 2 [IU] via SUBCUTANEOUS
  Administered 2016-04-11: 1 [IU] via SUBCUTANEOUS
  Administered 2016-04-11 (×2): 2 [IU] via SUBCUTANEOUS
  Administered 2016-04-12: 3 [IU] via SUBCUTANEOUS
  Administered 2016-04-12: 2 [IU] via SUBCUTANEOUS
  Administered 2016-04-12: 5 [IU] via SUBCUTANEOUS
  Administered 2016-04-13 (×2): 3 [IU] via SUBCUTANEOUS

## 2016-04-07 MED ORDER — SODIUM CHLORIDE 0.9 % IV SOLN
3.0000 g | Freq: Four times a day (QID) | INTRAVENOUS | Status: DC
  Administered 2016-04-07 – 2016-04-09 (×6): 3 g via INTRAVENOUS
  Filled 2016-04-07 (×7): qty 3

## 2016-04-07 MED ORDER — METOPROLOL TARTRATE 12.5 MG HALF TABLET
12.5000 mg | ORAL_TABLET | Freq: Two times a day (BID) | ORAL | Status: DC
  Administered 2016-04-07: 12.5 mg via ORAL
  Filled 2016-04-07 (×2): qty 1

## 2016-04-07 MED ORDER — HYDROXYZINE HCL 25 MG PO TABS
25.0000 mg | ORAL_TABLET | Freq: Four times a day (QID) | ORAL | Status: DC | PRN
  Administered 2016-04-08: 25 mg via ORAL
  Filled 2016-04-07: qty 1

## 2016-04-07 MED ORDER — DEXTROSE 5 % IV SOLN: 2.0000 g | Freq: Once | INTRAVENOUS | Status: DC

## 2016-04-07 MED ORDER — SODIUM CHLORIDE 0.9 % IV BOLUS (SEPSIS)
1000.0000 mL | Freq: Once | INTRAVENOUS | Status: AC
  Administered 2016-04-07: 1000 mL via INTRAVENOUS

## 2016-04-07 MED ORDER — CLOPIDOGREL BISULFATE 75 MG PO TABS
75.0000 mg | ORAL_TABLET | Freq: Every day | ORAL | Status: DC
  Administered 2016-04-07 – 2016-04-13 (×7): 75 mg via ORAL
  Filled 2016-04-07 (×7): qty 1

## 2016-04-07 MED ORDER — PRAVASTATIN SODIUM 40 MG PO TABS
40.0000 mg | ORAL_TABLET | Freq: Every day | ORAL | Status: DC
  Administered 2016-04-07 – 2016-04-13 (×7): 40 mg via ORAL
  Filled 2016-04-07: qty 1
  Filled 2016-04-07: qty 2
  Filled 2016-04-07: qty 1
  Filled 2016-04-07 (×2): qty 2
  Filled 2016-04-07 (×2): qty 1

## 2016-04-07 MED ORDER — ENSURE ENLIVE PO LIQD
237.0000 mL | Freq: Three times a day (TID) | ORAL | Status: DC
  Administered 2016-04-07 – 2016-04-13 (×16): 237 mL via ORAL

## 2016-04-07 MED ORDER — ACETAMINOPHEN 325 MG PO TABS
650.0000 mg | ORAL_TABLET | Freq: Four times a day (QID) | ORAL | Status: DC | PRN
  Administered 2016-04-07 (×2): 650 mg via ORAL
  Filled 2016-04-07 (×2): qty 2

## 2016-04-07 MED ORDER — IPRATROPIUM BROMIDE 0.02 % IN SOLN
0.5000 mg | Freq: Once | RESPIRATORY_TRACT | Status: AC
  Administered 2016-04-07: 0.5 mg via RESPIRATORY_TRACT
  Filled 2016-04-07: qty 2.5

## 2016-04-07 MED ORDER — VITAMIN B-12 1000 MCG PO TABS
1000.0000 ug | ORAL_TABLET | Freq: Every day | ORAL | Status: DC
  Administered 2016-04-07 – 2016-04-13 (×7): 1000 ug via ORAL
  Filled 2016-04-07 (×7): qty 1

## 2016-04-07 MED ORDER — SODIUM CHLORIDE 0.9 % IV BOLUS (SEPSIS)
500.0000 mL | Freq: Once | INTRAVENOUS | Status: AC
  Administered 2016-04-07: 500 mL via INTRAVENOUS

## 2016-04-07 MED ORDER — HYDROCORTISONE NA SUCCINATE PF 100 MG IJ SOLR
50.0000 mg | Freq: Four times a day (QID) | INTRAMUSCULAR | Status: DC
  Administered 2016-04-07 – 2016-04-09 (×7): 50 mg via INTRAVENOUS
  Filled 2016-04-07 (×6): qty 2

## 2016-04-07 MED ORDER — DEXTROSE 5 % IV SOLN
1.0000 g | Freq: Three times a day (TID) | INTRAVENOUS | Status: DC
  Filled 2016-04-07: qty 1

## 2016-04-07 MED ORDER — VANCOMYCIN HCL IN DEXTROSE 1-5 GM/200ML-% IV SOLN: 1000.0000 mg | Freq: Once | INTRAVENOUS | Status: DC

## 2016-04-07 MED ORDER — GABAPENTIN 300 MG PO CAPS
300.0000 mg | ORAL_CAPSULE | Freq: Two times a day (BID) | ORAL | Status: DC
  Administered 2016-04-07 – 2016-04-13 (×13): 300 mg via ORAL
  Filled 2016-04-07 (×14): qty 1

## 2016-04-07 NOTE — Progress Notes (Signed)
Pharmacy Antibiotic Note  RAVIS PINGITORE is a 82 y.o. male admitted on 04/06/2016 with sepsis.  Pharmacy has been consulted for Vancomycin, aztreonam dosing.  Plan: Vancomycin 750mg  iv q12hr (after 1gm in ED) Aztreonam 1gm iv q8hr   Height: 6' (182.9 cm) Weight: 177 lb 0.5 oz (80.3 kg) IBW/kg (Calculated) : 77.6  Temp (24hrs), Avg:101.2 F (38.4 C), Min:99.1 F (37.3 C), Max:103.5 F (39.7 C)   Recent Labs Lab 04/07/16 0013 04/07/16 0027 04/07/16 0421  WBC 12.9*  --  15.4*  CREATININE 1.21  --  1.15  LATICACIDVEN  --  2.49* 2.4*    Estimated Creatinine Clearance: 50.6 mL/min (by C-G formula based on Cr of 1.15).    Allergies  Allergen Reactions  . Losartan Other (See Comments)    Hyperkalemia > 7  . Azithromycin Itching  . Contrast Media [Iodinated Diagnostic Agents] Rash  . Doxycycline Itching       . Keflex [Cephalexin] Itching  . Macrodantin Itching  . Minocycline Hcl Itching  . Nitrofurantoin Itching  . Zithromax [Azithromycin Dihydrate] Itching    Antimicrobials this admission: Vancomycin 5/3 >> Levofloxacin 5/3 x1 Aztreonam 5/3 >>  Dose adjustments this admission:   Microbiology results: Pending  Thank you for allowing pharmacy to be a part of this patient's care.  Nani Skillern Crowford 04/07/2016 5:38 AM

## 2016-04-07 NOTE — Progress Notes (Signed)
ANTICOAGULATION CONSULT NOTE - Initial Consult  Pharmacy Consult for Heparin Indication: chest pain/ACS  Allergies  Allergen Reactions  . Losartan Other (See Comments)    Hyperkalemia > 7  . Azithromycin Itching  . Contrast Media [Iodinated Diagnostic Agents] Rash  . Doxycycline Itching       . Keflex [Cephalexin] Itching  . Macrodantin Itching  . Minocycline Hcl Itching  . Nitrofurantoin Itching  . Zithromax [Azithromycin Dihydrate] Itching    Patient Measurements: Height: 6' (182.9 cm) Weight: 177 lb 0.5 oz (80.3 kg) IBW/kg (Calculated) : 77.6 Heparin Dosing Weight:   Vital Signs: Temp: 103.5 F (39.7 C) (05/03 0416) Temp Source: Rectal (05/03 0416) BP: 111/64 mmHg (05/03 0416) Pulse Rate: 118 (05/03 0416)  Labs:  Recent Labs  04/07/16 0013 04/07/16 0200 04/07/16 0421  HGB 9.2*  --  9.4*  HCT 27.7*  --  28.6*  PLT 74*  --  60*  APTT  --  36  --   LABPROT  --  16.5*  --   INR  --  1.37  --   CREATININE 1.21  --  1.15  TROPONINI  --   --  1.29*    Estimated Creatinine Clearance: 50.6 mL/min (by C-G formula based on Cr of 1.15).   Medical History: Past Medical History  Diagnosis Date  . CAD (coronary artery disease)     a. s/p CABG 1995;  b. NSTEMI & subsequent BMS to SVG->right PDA 02/03/12.;  c. Cath 02/14/2012 3vd with 4/4 patent grafts and patent stent in vg->pda;  d. 10/2014 PCI/DES to the VG->PDA.  . Carotid stenosis     a. Carotid dopplers [id number]: RICA [id number], LICA [id number];  b. dopplers 3/14:  [id number] RICA, [id number] LICA  . HTN (hypertension)   . HLD (hyperlipidemia)   . Asthmatic bronchitis   . BPH (benign prostatic hypertrophy)   . Herpes zoster   . Anemia   . Thrombocytopenia ([place])     a. secondary to splenomegaly related to lymphoma with suspicion of bone marrow involvement. (Plavix had to be stopped due to this)  . Aortic stenosis, severe     a. ECHO 07/22/14 Severe AS. Peak and mean gradients of 57 mmHg and 42 mmHg, respectively. Calculated AVA  is 0.8-0.9 cm2. Trivial AR. Valve area (VTI): 0.84 cm^2. Valve area (Vmax): 0.86 cm^2.. Aortic root dimension: 42 mm (ED) and aortic root mildly dilated;  b. 10/2014 S/P TAVR w/ Edwards Sapien 3 THV 12mm;  c. 10/2014 Echo: EF 50%, nl fxning AoV, mean grad of 9, peak of 16.  Marland Kitchen Splenomegaly   . COPD (chronic obstructive pulmonary disease) ([place])   . Peripheral vascular disease ([place])     a. s/p L ray amp 10/13;  b. s/p L 2nd toe amp 12/13  . Pulmonary nodule     a. 59mm RUL calcified pulm nodule noted on CT staging for lymphoma - stable 10/2012.  . Leg DVT (deep venous thromboembolism), chronic ([place])     a. LE dopplers 5/13, 10/13 and 1/14: chronic DVT involving right mid femoral vein, left mid femoral vein, and left popliteal vein.  . Osteomyelitis ([place])     a. L first foot ray amputation 09/2012  . Heart murmur   . Anginal pain ([place])   . Myocardial infarction [facility]) ?1995  . Pneumonia ?2014  . DM2 (diabetes mellitus, type 2) ([place])   . DJD (degenerative joint disease)   . Mantle cell lymphoma of intra-abdominal lymph nodes ([place])     a.  Stage III s/p bendamustine, Rituxan therapy  . 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 29 mm Edwards Sapien 3 transcatheter heart valve placed via open right transfemoral approach    Medications:  Infusions:  . sodium chloride    . heparin      Assessment: Patient with (+) Troponin.  No oral anticoagulants noted on med rec.   Baseline INR/PTT WNL, PT just above upper limit of normal.    Goal of Therapy:  Heparin level 0.3-0.7 units/ml Monitor platelets by anticoagulation protocol: Yes   Plan:  Heparin bolus 3000 units iv x1 Heparin drip at 950 units/hr Heparin level at 1400 Daily CBC  Nani Skillern Crowford 04/07/2016,5:29 AM

## 2016-04-07 NOTE — Progress Notes (Addendum)
Patient seen and examined, reported ab pain,  on room air, does not seem in distress,  lung crackles at left basis, ab mild diffuse tender. No edema,  sepsis of unclear source, on broad spectrum abx, pending CT,  patient was discharged a month ago for pna, with h/o dm2 and mantle cell lymphoma ( in remission). Labs with leukocytosis, thrombocytopenia, hyponatremia, left elevation, troponin elevation, lactic acid, and elevated procalcitonin. Wife in room, updated.  Addendum: repeat imaging showed small bilateral pleural effusion, small ascites, new liver lesion? Splenomegaly, though no clear source of infection identified, US guided paracentesis with fluids study, culture , cytology ordered,  Concerning for recurrent mantle cell lymphoma, I have dicussed the case with oncology Dr Alvy Bimler, she graciously accepted the consult, will see patient.  Patient has developed hypotension this afternoon, d/c imdur and lopressor, he is continued on ivf, start stress dose steroids, continue abx, i have discussed with daughter over the phone, she confirmed patient to be DNR status but agreed to pressors if needed, icu attending paged.  Addendum: heparin drip was started in the ED due to elevated troponin, patient denies chest pain, he does has afib, but he is thrombocytopenic, will d/c heparin drip, continue plavix /asa for now.  Time spent an hour  From 2:40 pm to 3:40 pm

## 2016-04-07 NOTE — ED Notes (Signed)
Spoke to [employer], Therapist, sports on 4E.  Advised patient to have CT and swallow screen prior to bringing patient to floor.

## 2016-04-07 NOTE — ED Notes (Signed)
Attempted x2 blood draws, this RN was unsuccessful.  Will notify RN on floor.

## 2016-04-07 NOTE — Progress Notes (Signed)
eLink Physician-Brief Progress Note Patient Name: [name] DOB: 11/18/1927 MRN: [id number]   Date of Service  04/07/2016  HPI/Events of Note  Blood Culture positive for Enterococcus species. Vancomycin resistance not detected. Pharmacy recommends changing Abx coverage to Unasyn alone.   eICU Interventions  Will change Abx to Unasyn monotherapy per pharmacy consultation.      Intervention Category Major Interventions: Infection - evaluation and management  Sommer,Steven Eugene 04/07/2016, 10:15 PM

## 2016-04-07 NOTE — Consult Note (Signed)
PULMONARY / CRITICAL CARE MEDICINE   Name: [name] MRN: [id number] DOB: 1927/10/23    ADMISSION DATE:  04/06/2016 CONSULTATION DATE:  04/07/16  REFERRING MD:  Dr Alvy Bimler  Reason for consult:  shock  HISTORY OF PRESENT ILLNESS:   82 yo man, extensive PMH including DM, PVD, COPD, CAD w remote CABG, TAVR for severe AS in 11/2014. Mantle cell lymphoma c/b cytopenias and splenomegaly, felt to be in remission.  Was admitted from 4/7 - 4/10 with an apparent CAP.  Apparently continued to have cough and some dyspnea after treatment. Was readmitted on 5/3 with abdominal discomfort, fullness and poor PO intake. Denied N/V. He developed a fever and chills. He was admitted with suspected sepsis, source unclear. A CT CAP was performed as detailed below - shows some B basilar rounded infiltrate that is improved compared with 03/12/16. There are very small b effusions. Changes in the liver consistent w cirrhosis, new infiltrative lesions concerning for metastatic disease, ascites. He has received 4L IVF resuscitation, is on broad s[pectrum abx. He was moved to ICU 5/3 am with persistent hypotension. He was started on low dose phenylephrine via PIV with stabilization of BP. His code status has been discussed with [facility] > he is DNR/I, would be accepting of pressors for support through reversible process. PCCM asked to evaluate in the ICU.   PAST MEDICAL HISTORY :  He  has a past medical history of CAD (coronary artery disease); Carotid stenosis; HTN (hypertension); HLD (hyperlipidemia); Asthmatic bronchitis; BPH (benign prostatic hypertrophy); Herpes zoster; Anemia; Thrombocytopenia ([place]); Aortic stenosis, severe; Splenomegaly; COPD (chronic obstructive pulmonary disease) ([place]); Peripheral vascular disease ([place]); Pulmonary nodule; Leg DVT (deep venous thromboembolism), chronic ([place]); Osteomyelitis ([place]); Heart murmur; Anginal pain ([place]); Myocardial infarction ([place]) (?1995); Pneumonia (?2014); DM2 (diabetes mellitus,  type 2) ([place]); DJD (degenerative joint disease); 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  PAST SURGICAL HISTORY: He  has past surgical history that includes Coronary artery bypass graft (1995); Shoulder arthroscopy w/ rotator cuff repair (Right); Carpal tunnel release (Bilateral); Incisional hernia repair; Kidney surgery; Back surgery; I&D extremity (09/16/2012); Amputation (09/16/2012); Amputation (11/21/2012); I&D extremity (Left, 03/01/2013); Amputation (Left, 12/20/2013); Achilles tendon lengthening (Left, 12/20/2013); Hernia repair; Coronary angioplasty with stent (09/2014; 10/14/2014); Cataract extraction w/ intraocular lens  implant, bilateral (Bilateral); Transcatheter aortic valve replacement, transfemoral (N/A, 11/05/2014); Intraoprative transesophageal echocardiogram (N/A, 11/05/2014); left and right heart catheterization with coronary/graft angiogram (N/A, 02/03/2012); percutaneous stent intervention (02/03/2012); left heart catheterization with coronary/graft angiogram (N/A, 02/14/2012); left and right heart catheterization with coronary/graft angiogram (N/A, 09/23/2014); and percutaneous coronary stent intervention (pci-s) (10/14/2014).  Allergies  Allergen Reactions  . Losartan Other (See Comments)    Hyperkalemia > 7  . Azithromycin Itching  . Contrast Media [Iodinated Diagnostic Agents] Rash  . Doxycycline Itching       . Keflex [Cephalexin] Itching  . Macrodantin Itching  . Minocycline Hcl Itching  . Nitrofurantoin Itching  . Zithromax [Azithromycin Dihydrate] Itching    No current facility-administered medications on file prior to encounter.   Current Outpatient Prescriptions on File Prior to Encounter  Medication Sig  . ANORO ELLIPTA 62.5-25 MCG/INH AEPB Inhale 1 puff into the lungs daily.   Marland Kitchen aspirin EC 81 MG tablet Take 81 mg by mouth every morning.   . clopidogrel (PLAVIX) 75 MG tablet TAKE  ONE TABLET BY MOUTH ONCE DAILY (Patient taking differently: TAKE 75 MG BY MOUTH ONCE DAILY)  . feeding supplement, ENSURE ENLIVE, (  ENSURE ENLIVE) LIQD Take 237 mLs by mouth 3 (three) times daily between meals.  . gabapentin (NEURONTIN) 300 MG capsule Take 300 mg by mouth 2 (two) times daily.   . hydrOXYzine (ATARAX/VISTARIL) 25 MG tablet Take 25 mg by mouth every 6 (six) hours as needed for itching.  . isosorbide mononitrate (IMDUR) 30 MG 24 hr tablet TAKE ONE TABLET BY MOUTH ONCE DAILY (Patient taking differently: TAKE 30 MG BY MOUTH ONCE DAILY)  . metFORMIN (GLUCOPHAGE) 500 MG tablet Take 500 mg by mouth 2 (two) times daily with a meal.  . nitroGLYCERIN (NITROSTAT) 0.4 MG SL tablet Place 1 tablet (0.4 mg total) under the tongue every 5 (five) minutes as needed for chest pain.  . pravastatin (PRAVACHOL) 40 MG tablet Take 40 mg by mouth daily.  . traMADol (ULTRAM) 50 MG tablet Take 50 mg by mouth 2 (two) times daily.  . vitamin B-12 (CYANOCOBALAMIN) 500 MCG tablet Take 1,000 mcg by mouth daily.   Marland Kitchen levofloxacin (LEVAQUIN) 750 MG tablet Take 1 tablet (750 mg total) by mouth daily. (Patient not taking: Reported on 04/07/2016)    FAMILY HISTORY:  His indicated that his mother is deceased. He indicated that his father is deceased.   SOCIAL HISTORY: He  reports that he quit smoking about 31 years ago. His smoking use included Cigarettes and Pipe. He has a 30 pack-year smoking history. He has quit using smokeless tobacco. His smokeless tobacco use included Chew. He reports that he does not drink alcohol or use illicit drugs.  REVIEW OF SYSTEMS:   C/o abdominal pain, no N/V, poor appetite  SUBJECTIVE:  Denies pain currently On phenylephrine 10  VITAL SIGNS: BP 119/52 mmHg  Pulse 82  Temp(Src) 98.6 F (37 C) (Oral)  Resp 27  Ht 6' (1.829 m)  Wt 80.3 kg (177 lb 0.5 oz)  BMI 24.00 kg/m2  SpO2 99%  HEMODYNAMICS:    VENTILATOR SETTINGS:    INTAKE / OUTPUT: I/O last 3 completed  shifts: In: 2500 [I.V.:2500] Out: -   PHYSICAL EXAMINATION: General: weak elderly man, NAD on RA Neuro:  Awake and alert, oriented to self, time and place, moves all ext HEENT:  Op dry, PERRL Cardiovascular:  Regular, 2/6 syst M Lungs:  Clear bilaterally Abdomen:  Slightly distended, tender diffusely to palp Musculoskeletal:  No deformities, prior foot amputation  Skin: no rash  LABS:  BMET  Recent Labs Lab 04/07/16 0013 04/07/16 0421  NA 129* 130*  K 4.3 4.4  CL 97* 102  CO2 21* 18*  BUN 27* 28*  CREATININE 1.21 1.15  GLUCOSE 188* 195*    Electrolytes  Recent Labs Lab 04/07/16 0013 04/07/16 0421  CALCIUM 9.4 8.6*    CBC  Recent Labs Lab 04/07/16 0013 04/07/16 0421  WBC 12.9* 15.4*  HGB 9.2* 9.4*  HCT 27.7* 28.6*  PLT 74* 60*    Coag's  Recent Labs Lab 04/07/16 0200  APTT 36  INR 1.37    Sepsis Markers  Recent Labs Lab 04/07/16 0421 04/07/16 0757 04/07/16 1013  LATICACIDVEN 2.4* 1.9 2.2*  PROCALCITON 2.57  --   --     ABG No results for input(s): PHART, PCO2ART, PO2ART in the last 168 hours.  Liver Enzymes  Recent Labs Lab 04/07/16 0013 04/07/16 0421  AST 50* 73*  ALT 31 37  ALKPHOS 221* 220*  BILITOT 1.9* 2.2*  ALBUMIN 3.6 3.2*    Cardiac Enzymes  Recent Labs Lab 04/07/16 0421 04/07/16 1013  TROPONINI 1.29* 1.27*  Glucose  Recent Labs Lab 04/07/16 0946 04/07/16 1212 04/07/16 1610  GLUCAP 136* 138* 120*    Imaging Ct Abdomen Pelvis Wo Contrast  04/07/2016  CLINICAL DATA:  Abdominal pain.  Mild diffuse abdominal tenderness. EXAM: CT CHEST, ABDOMEN AND PELVIS WITHOUT CONTRAST TECHNIQUE: Multidetector CT imaging of the chest, abdomen and pelvis was performed following the standard protocol without IV contrast. COMPARISON:  03/12/2016 FINDINGS: CT CHEST FINDINGS Mediastinum/Lymph Nodes: There is moderate cardiac enlargement. Calcification of the mitral valve noted. There is a prosthetic aortic valve. Previous  median sternotomy and CABG procedure noted. The trachea appears patent and is midline. Normal appearance of the esophagus. No axillary or supraclavicular adenopathy. No mediastinal or hilar adenopathy. Lungs/Pleura: Small pleural effusions are identified bilaterally. There are bilateral lower lobe pulmonary opacities which appear stable to slightly improved from previous exam, image 77 of series 4. Musculoskeletal: No chest wall mass or suspicious bone lesions identified. CT ABDOMEN PELVIS FINDINGS Hepatobiliary: Hypertrophy of the lateral segment of left lobe of liver and caudate lobe of liver identified. Findings are suggestive of cirrhosis. Ill defined low-attenuation lesion within the right lobe of liver measures 1.8 cm, image 59 of series 2. This appears new from 10/08/2014 and is indeterminate. Posterior right lobe of liver low-attenuation structure measures 1.5 cm, image 65 of series 2. Also new from 10/08/2014. Gallbladder is normal. No biliary dilatation. Pancreas: No mass or inflammatory process identified on this un-enhanced exam. Spleen: The spleen is enlarged measuring 17 cm in cranial caudal dimension. Adrenals/Urinary Tract: Normal adrenal glands. Unremarkable appearance of the right kidney peer the lower pole calculus measures 6 mm, image 77 of series 2. Mild diffuse bladder wall thickening. Small diverticula arises from the right lateral wall of the bladder, image 115 of series 2 Stomach/Bowel: The stomach is normal. The small bowel loops have a normal course and caliber. There is no evidence for bowel obstruction. No pathologic dilatation of the colon. Multiple distal colonic diverticula noted. Vascular/Lymphatic: Calcified atherosclerotic disease involves the abdominal aorta. No aneurysm. No adenopathy identified within the abdomen or pelvis. Reproductive: Prostate gland and seminal vesicles are unremarkable. Other: There is moderate ascites identified within the abdomen and pelvis. No focal fluid  collections identified. Musculoskeletal: Spondylosis is identified within the lumbar spine. This is most advanced at the L5-S1 level. No aggressive lytic or sclerotic bone lesions noted. IMPRESSION: 1. No findings identified to explain patient's sepsis. 2. Morphologic features a liver are concerning for cirrhosis. 3. There are 2 indeterminate low-attenuation foci within the right lobe of liver, new from 10/08/2014. More definitive characterization with contrast enhanced MRI of the liver is suggested as patient's clinical condition tolerates. 4. Splenomegaly 5. Ascites 6. Bilateral pleural effusions with mild bilateral lower lobe pneumonitis. Aeration to the lower lobes appear slightly improved from 03/12/2016. 7. Aortic atherosclerosis 8. Lumbar spondylosis. Electronically Signed   By: Kerby Moors M.D.   On: 04/07/2016 11:58   Ct Head Wo Contrast  04/07/2016  CLINICAL DATA:  Fever, confusion, elevated white cell count. Acute encephalopathy. EXAM: CT HEAD WITHOUT CONTRAST TECHNIQUE: Contiguous axial images were obtained from the base of the skull through the vertex without intravenous contrast. COMPARISON:  07/21/2014 FINDINGS: Examination is somewhat limited by motion artifact. Diffuse cerebral atrophy. Mild ventricular dilatation consistent with central atrophy. Low-attenuation changes in the deep white matter consistent small vessel ischemia. No mass effect or midline shift. No abnormal extra-axial fluid collections. Gray-white matter junctions are distinct. Basal cisterns are not effaced. No evidence of acute intracranial  hemorrhage. No depressed skull fractures. Visualized paranasal sinuses and mastoid air cells are not opacified. IMPRESSION: No acute intracranial abnormalities. Mild chronic atrophy and small vessel ischemic changes. Electronically Signed   By: Lucienne Capers M.D.   On: 04/07/2016 03:14   Ct Chest Wo Contrast  04/07/2016  CLINICAL DATA:  Abdominal pain.  Mild diffuse abdominal  tenderness. EXAM: CT CHEST, ABDOMEN AND PELVIS WITHOUT CONTRAST TECHNIQUE: Multidetector CT imaging of the chest, abdomen and pelvis was performed following the standard protocol without IV contrast. COMPARISON:  03/12/2016 FINDINGS: CT CHEST FINDINGS Mediastinum/Lymph Nodes: There is moderate cardiac enlargement. Calcification of the mitral valve noted. There is a prosthetic aortic valve. Previous median sternotomy and CABG procedure noted. The trachea appears patent and is midline. Normal appearance of the esophagus. No axillary or supraclavicular adenopathy. No mediastinal or hilar adenopathy. Lungs/Pleura: Small pleural effusions are identified bilaterally. There are bilateral lower lobe pulmonary opacities which appear stable to slightly improved from previous exam, image 77 of series 4. Musculoskeletal: No chest wall mass or suspicious bone lesions identified. CT ABDOMEN PELVIS FINDINGS Hepatobiliary: Hypertrophy of the lateral segment of left lobe of liver and caudate lobe of liver identified. Findings are suggestive of cirrhosis. Ill defined low-attenuation lesion within the right lobe of liver measures 1.8 cm, image 59 of series 2. This appears new from 10/08/2014 and is indeterminate. Posterior right lobe of liver low-attenuation structure measures 1.5 cm, image 65 of series 2. Also new from 10/08/2014. Gallbladder is normal. No biliary dilatation. Pancreas: No mass or inflammatory process identified on this un-enhanced exam. Spleen: The spleen is enlarged measuring 17 cm in cranial caudal dimension. Adrenals/Urinary Tract: Normal adrenal glands. Unremarkable appearance of the right kidney peer the lower pole calculus measures 6 mm, image 77 of series 2. Mild diffuse bladder wall thickening. Small diverticula arises from the right lateral wall of the bladder, image 115 of series 2 Stomach/Bowel: The stomach is normal. The small bowel loops have a normal course and caliber. There is no evidence for bowel  obstruction. No pathologic dilatation of the colon. Multiple distal colonic diverticula noted. Vascular/Lymphatic: Calcified atherosclerotic disease involves the abdominal aorta. No aneurysm. No adenopathy identified within the abdomen or pelvis. Reproductive: Prostate gland and seminal vesicles are unremarkable. Other: There is moderate ascites identified within the abdomen and pelvis. No focal fluid collections identified. Musculoskeletal: Spondylosis is identified within the lumbar spine. This is most advanced at the L5-S1 level. No aggressive lytic or sclerotic bone lesions noted. IMPRESSION: 1. No findings identified to explain patient's sepsis. 2. Morphologic features a liver are concerning for cirrhosis. 3. There are 2 indeterminate low-attenuation foci within the right lobe of liver, new from 10/08/2014. More definitive characterization with contrast enhanced MRI of the liver is suggested as patient's clinical condition tolerates. 4. Splenomegaly 5. Ascites 6. Bilateral pleural effusions with mild bilateral lower lobe pneumonitis. Aeration to the lower lobes appear slightly improved from 03/12/2016. 7. Aortic atherosclerosis 8. Lumbar spondylosis. Electronically Signed   By: Kerby Moors M.D.   On: 04/07/2016 11:58   Dg Chest Port 1 View  04/07/2016  CLINICAL DATA:  Fever for 48 hours. EXAM: PORTABLE CHEST 1 VIEW COMPARISON:  03/12/2016 FINDINGS: There is moderate cardiomegaly and aortic tortuosity, unchanged. Prior sternotomy and CABG. No consolidation. No large effusions. Mild chronic appearing interstitial coarsening. IMPRESSION: Cardiomegaly.  No acute cardiopulmonary findings. Electronically Signed   By: Andreas Newport M.D.   On: 04/07/2016 00:34     STUDIES:   CULTURES: Blood  5/3 >>  Urine 5/3 >>  resp 5/3 >>   ANTIBIOTICS: levaquin 5/3 >> single dose Cefepime 5/3 >>  vanco 5/3 >>   SIGNIFICANT EVENTS:  LINES/TUBES:  DISCUSSION: 82 yo man, admitted with fever / chills and  progressive hypotension, clinical picture consistent with sepsis with unclear source. He has require IVF and now low dose phenylephrine for support. He is on good broad spectrum abx coverage although not really covered for anaerobes. Cultures are pending. Potential sources include SBP, recurrent PNA (although his infiltrates are improved and look like rounded atx on his CT scan). Consider also UTI.   Recs:  - continue IVF resuscitation for another 24h, then decrease given his cardiac hx - wean phenylephrine to off as able. He is on very low dose and I do not believe we need to push for CVC at this point - consider abd [place] to assess for paracentesis (note on plavix) - stress dose steroids, would d/c if/when pressors weaned to off.  - there does not appear to be enough pleural fluid to tap - continue current abx, consider broaden to cover anaerobes if suspicion for aspiration increases. Follow cx and tailor appropriately.  - continue Anoro - we will follow with you until hemodynamically stable.    Baltazar Apo, MD, PhD 04/07/2016, 6:24 PM [facility] [phone] or if no answer [phone]

## 2016-04-07 NOTE — ED Notes (Signed)
Patient transported to CT 

## 2016-04-07 NOTE — ED Provider Notes (Signed)
Care assumed from Dr. Lita Mains at shift change.  Patient is 82 years old, febrile, and somewhat confused.  His workup reveals a slight elevation of white count, slight elevation of lactate.  He was recently admitted for pneumonia and I suspect because of his fever is pulmonary.  He was given Levaquin and IV fluids.  He will be admitted to the hospitalist service under the care of Dr. Hal Hope.  Veryl Speak, MD 04/07/16 [phone]

## 2016-04-07 NOTE — Progress Notes (Signed)
CRITICAL VALUE ALERT  Critical value received:  Troponin 1.29  Date of notification:  04/07/2016  Time of notification:  0516  Critical value read back:Yes.    Nurse who received alert:  Raynelle Jan, RN  MD notified (1st page):  Hal Hope, MD  Time of first page:  [phone]  MD notified (2nd page):  Time of second page:  Responding MD:  Hal Hope, MD  Time MD responded:  [phone]

## 2016-04-07 NOTE — Care Management Note (Signed)
Case Management Note  Patient Details  Name: ALEIX KARMEL MRN: [id number] Date of Birth: 09/26/1927  Subjective/Objective:         sepsis           Action/Plan:Date:  Apr 07, 2016 Chart reviewed for concurrent status and case management needs. Will continue to follow patient for changes and needs: Velva Harman, BSN, [place], [place]   Expected Discharge Date:                  Expected Discharge Plan:  Home/Self Care  In-House Referral:     Discharge planning Services  CM Consult  Post Acute Care Choice:    Choice offered to:     DME Arranged:    DME Agency:     HH Arranged:    [place] Agency:     Status of Service:  Completed, signed off  Medicare Important Message Given:    Date Medicare IM Given:    Medicare IM give by:    Date Additional Medicare IM Given:    Additional Medicare Important Message give by:     If discussed at [place] of Stay Meetings, dates discussed:    Additional Comments:  Leeroy Cha, RN 04/07/2016, 11:21 AM

## 2016-04-07 NOTE — Progress Notes (Signed)
CRITICAL VALUE ALERT  Critical value received:  Lactic Acid 2.4  Date of notification:  04/07/2016  Time of notification:  0504  Critical value read back:Yes.    Nurse who received alert:  Raynelle Jan, RN  MD notified (1st page):  Rachel Moulds, MD  Time of first page:  [phone]  MD notified (2nd page):  Time of second page:  Responding MD:  Hal Hope, MD  Time MD responded:  [phone]

## 2016-04-07 NOTE — Progress Notes (Signed)
Pharmacy Antibiotic Note  [name] is a 82 y.o. male admitted on 04/06/2016 with sepsis from unknown source (possibly SBP vs UTI vs Pneumonia.)  Pharmacy consulted for Vancomycin and cefepime dosing.   New Rapid Blood culture identification shows Enterococcus with no resistance mechanisms.  Preferred regimen is Ampicillin monotherapy, but due to ongoing concern for anaerobe and gram negative pathogens from GI translocation, MD prefers to continue broader therapy.  Plan:  BCID results were discussed with Dr. Oletta Darter and the patient's antibiotics will be changed to Unasyn. Further narrowing will be determined based on finalized susceptibilities.   Unasyn 3g IV q6h  Follow up renal fxn, culture results, and clinical course.    Height: 6' (182.9 cm) Weight: 177 lb 0.5 oz (80.3 kg) IBW/kg (Calculated) : 77.6  Temp (24hrs), Avg:99.5 F (37.5 C), Min:97.4 F (36.3 C), Max:103.5 F (39.7 C)   Recent Labs Lab 04/07/16 0013 04/07/16 0027 04/07/16 0421 04/07/16 0757 04/07/16 1013 04/07/16 1730  WBC 12.9*  --  15.4*  --   --   --   CREATININE 1.21  --  1.15  --   --  1.23  LATICACIDVEN  --  2.49* 2.4* 1.9 2.2* 1.8    Estimated Creatinine Clearance: 47.3 mL/min (by C-G formula based on Cr of 1.23).    Allergies  Allergen Reactions  . Losartan Other (See Comments)    Hyperkalemia > 7  . Azithromycin Itching  . Contrast Media [Iodinated Diagnostic Agents] Rash  . Doxycycline Itching       . Keflex [Cephalexin] Itching  . Macrodantin Itching  . Minocycline Hcl Itching  . Nitrofurantoin Itching  . Zithromax [Azithromycin Dihydrate] Itching    Antimicrobials this admission: Vancomycin 5/3 >> 5/3 Levofloxacin 5/3 x1 Aztreonam 5/3 >> 5/3 Cefepime >> 5/3 Unasyn 5/3 >>   Dose adjustments this admission: ---  Microbiology results: 5/3 BCx: Enterococcus species 5/3 UCx: sent 5/2 MRSA PCR: negative  Thank you for allowing pharmacy to be a part of this patient's  care.  Gretta Arab PharmD, BCPS Pager [phone] 04/07/2016 9:21 PM

## 2016-04-07 NOTE — Progress Notes (Signed)
eLink Physician-Brief Progress Note Patient Name: JAQAI VIRGILIO DOB: 07/09/1927 MRN: [id number]   Date of Service  04/07/2016  HPI/Events of Note  Hypotension - sepsis? No definite source. Already on stress dose hydrocortisone. LVEF = 50%-55%. Hx of severe AS now s/p TAVR. No CVL. Spoke with Dr. Erlinda Hong who has spoken with the family - no intubation, CPR or defibrillation. Vasopressors are OK.   eICU Interventions  Will order: 1. 0.9 NaCl 500 mL IV over 30 minutes now. 2. Phenylephrine IV infusion. Titrate to MAP >= 65.  3. Change stress dose hydrocortisone to 50 mg IV Q 6 hours.      Intervention Category Major Interventions: Hypotension - evaluation and management  Sommer,Steven Eugene 04/07/2016, 4:16 PM

## 2016-04-07 NOTE — H&P (Addendum)
History and Physical    TYLERJAMES [name] [id number] DOB: 28-Feb-1927 DOA: 04/06/2016  Referring MD/NP/PA: Dr.Delo. PCP: Donnajean Lopes, MD  Outpatient Specialists: Dr.Cooper. Patient coming from: Home.  Chief Complaint: Fever chills.  History obtained from patient's wife.  HPI: [name] is a 82 y.o. male with medical history significant of with history of TAVR, CAD status post CABG and PCI, Mantle cell lymphoma in remission, hyperlipidemia was brought to the ER patient had fever and chills. As per patient's wife patient has not been doing well since history morning. Patient also by the evening started having nausea vomiting. Denies any abdominal pain. Later started developing fever and chills and was brought to the ER. Patient was admitted last month for pneumonia and since discharge patient has been having persistent cough which has worsened past few days. Denies any diarrhea or dysuria. Patient also appears confused at times. CT of the head is unremarkable. Patient is admitted for SIRS/developing sepsis of unknown source.   ED Course: Patient was given fluid bolus and started on empiric antibiotics.  Review of Systems: As per HPI otherwise 10 point review of systems negative.    Past Medical History  Diagnosis Date  . CAD (coronary artery disease)     a. s/p CABG 1995;  b. NSTEMI & subsequent BMS to SVG->right PDA 02/03/12.;  c. Cath 02/14/2012 3vd with 4/4 patent grafts and patent stent in vg->pda;  d. 10/2014 PCI/DES to the VG->PDA.  . Carotid stenosis     a. Carotid dopplers [id number]: RICA [id number], LICA [id number];  b. dopplers 3/14:  123456 RICA, [id number] LICA  . HTN (hypertension)   . HLD (hyperlipidemia)   . Asthmatic bronchitis   . BPH (benign prostatic hypertrophy)   . Herpes zoster   . Anemia   . Thrombocytopenia ([place])     a. secondary to splenomegaly related to lymphoma with suspicion of bone marrow involvement. (Plavix had to be stopped due to this)  . Aortic stenosis,  severe     a. ECHO 07/22/14 Severe AS. Peak and mean gradients of 57 mmHg and 42 mmHg, respectively. Calculated AVA is 0.8-0.9 cm2. Trivial AR. Valve area (VTI): 0.84 cm^2. Valve area (Vmax): 0.86 cm^2.. Aortic root dimension: 42 mm (ED) and aortic root mildly dilated;  b. 10/2014 S/P TAVR w/ Edwards Sapien 3 THV 57mm;  c. 10/2014 Echo: EF 50%, nl fxning AoV, mean grad of 9, peak of 16.  Marland Kitchen Splenomegaly   . COPD (chronic obstructive pulmonary disease) ([place])   . Peripheral vascular disease ([place])     a. s/p L ray amp 10/13;  b. s/p L 2nd toe amp 12/13  . Pulmonary nodule     a. 47mm RUL calcified pulm nodule noted on CT staging for lymphoma - stable 10/2012.  . Leg DVT (deep venous thromboembolism), chronic ([place])     a. LE dopplers 5/13, 10/13 and 1/14: chronic DVT involving right mid femoral vein, left mid femoral vein, and left popliteal vein.  . Osteomyelitis ([place])     a. L first foot ray amputation 09/2012  . Heart murmur   . Anginal pain ([place])   . Myocardial infarction [facility]) ?1995  . Pneumonia ?2014  . DM2 (diabetes mellitus, type 2) ([place])   . DJD (degenerative joint disease)   . 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 Stage III s/p bendamustine, Rituxan therapy  . 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 29 mm  Edwards Sapien 3 transcatheter heart valve placed via open right transfemoral approach    Past Surgical History  Procedure Laterality Date  . Coronary artery bypass graft  1995  . Shoulder arthroscopy w/ rotator cuff repair Right   . Carpal tunnel release Bilateral   . Incisional hernia repair    . Kidney surgery      "cut me 1/2 in 2 for stones"  . Back surgery    . I&d extremity  09/16/2012    Procedure: IRRIGATION AND DEBRIDEMENT EXTREMITY;  Surgeon: Wylene Simmer, MD;  Location: [facility];  Service: Orthopedics;  Laterality: Left;  irrigation and debridement and first Ray amputation of left foot  . Amputation  09/16/2012     Procedure: AMPUTATION RAY;  Surgeon: Wylene Simmer, MD;  Location: [facility];  Service: Orthopedics;  Laterality: Left;  first Ray amputation left foot  . Amputation  11/21/2012    Procedure: AMPUTATION RAY;  Surgeon: Wylene Simmer, MD;  Location: [place];  Service: Orthopedics;  Laterality: Left;  LEFT SECOND TOE AMPUTATION  . I&d extremity Left 03/01/2013    Procedure: IRRIGATION AND DEBRIDEMENT EXTREMITY;  Surgeon: Wylene Simmer, MD;  Location: [facility];  Service: Orthopedics;  Laterality: Left;  IRRIGATION  AND  DEBRIDEMENT  LEFT  FOOT  . Amputation Left 12/20/2013    Procedure: AMPUTATION LEFT 5TH TRANSMETATARSAL;  Surgeon: Wylene Simmer, MD;  Location: [place];  Service: Orthopedics;  Laterality: Left;  . Achilles tendon lengthening Left 12/20/2013    Procedure: ACHILLES TENDON LENGTHENING;  Surgeon: Wylene Simmer, MD;  Location: [place];  Service: Orthopedics;  Laterality: Left;  . Hernia repair    . Coronary angioplasty with stent placement  09/2014; 10/14/2014    "?2; 1"  . Cataract extraction w/ intraocular lens  implant, bilateral Bilateral   . Transcatheter aortic valve replacement, transfemoral N/A 11/05/2014    Procedure: TRANSCATHETER AORTIC VALVE REPLACEMENT, TRANSFEMORAL;  Surgeon: [name], MD;  Location: [facility] OR; Berniece Pap 3 THV (size 29 mm, model # [id number], serial # [id number])   . Intraoperative transesophageal echocardiogram N/A 11/05/2014    Procedure: INTRAOPERATIVE TRANSESOPHAGEAL ECHOCARDIOGRAM;  Surgeon: [name], MD;  Location[facility] OR;  Service: Open Heart Surgery;  Laterality: N/A;  . Left and right heart catheterization with coronary/graft angiogram N/A 02/03/2012    Procedure: LEFT AND RIGHT HEART CATHETERIZATION WITH Beatrix Fetters;  Surgeon: [name], MD;  Location: [facility] CATH LAB;  Service: Cardiovascular;  Laterality: N/A;  . Percutaneous stent intervention  02/03/2012    Procedure: PERCUTANEOUS STENT INTERVENTION;  Surgeon: [name], MD;  Location: [facility] CATH  LAB;  Service: Cardiovascular;;  . Left heart catheterization with coronary/graft angiogram N/A 02/14/2012    Procedure: LEFT HEART CATHETERIZATION WITH Beatrix Fetters;  Surgeon: Peter M Martinique, MD;  Location: [facility] CATH LAB;  Service: Cardiovascular;  Laterality: N/A;  . Left and right heart catheterization with coronary/graft angiogram N/A 09/23/2014    Procedure: LEFT AND RIGHT HEART CATHETERIZATION WITH Beatrix Fetters;  Surgeon: Blane Ohara, MD;  Location: [facility] CATH LAB;  Service: Cardiovascular;  Laterality: N/A;  . Percutaneous coronary stent intervention (pci-s)  10/14/2014    Procedure: PERCUTANEOUS CORONARY STENT INTERVENTION (PCI-S);  Surgeon: Blane Ohara, MD;  Location: [facility] CATH LAB;  Service: Cardiovascular;;     reports that he quit smoking about 31 years ago. His smoking use included Cigarettes and Pipe. He has a 30 pack-year smoking history. He has quit using smokeless tobacco. His smokeless tobacco use included Chew. He reports  that he does not drink alcohol or use illicit drugs.  Allergies  Allergen Reactions  . Losartan Other (See Comments)    Hyperkalemia > 7  . Azithromycin Itching  . Contrast Media [Iodinated Diagnostic Agents] Rash  . Doxycycline Itching       . Keflex [Cephalexin] Itching  . Macrodantin Itching  . Minocycline Hcl Itching  . Nitrofurantoin Itching  . Zithromax [Azithromycin Dihydrate] Itching    Family History  Problem Relation Age of Onset  . Coronary artery disease    . Alcohol abuse    . Other Mother   . Other Father     Prior to Admission medications   Medication Sig Start Date End Date Taking? Authorizing Provider  ANORO ELLIPTA 62.5-25 MCG/INH AEPB Inhale 1 puff into the lungs daily.  02/12/16  Yes Historical Provider, MD  aspirin EC 81 MG tablet Take 81 mg by mouth every morning.    Yes Historical Provider, MD  clopidogrel (PLAVIX) 75 MG tablet TAKE ONE TABLET BY MOUTH ONCE DAILY Patient taking differently: TAKE  75 MG BY MOUTH ONCE DAILY 01/07/16  Yes [name], MD  feeding supplement, ENSURE ENLIVE, (ENSURE ENLIVE) LIQD Take 237 mLs by mouth 3 (three) times daily between meals. 03/15/16  Yes Modena Jansky, MD  gabapentin (NEURONTIN) 300 MG capsule Take 300 mg by mouth 2 (two) times daily.  03/03/13  Yes Haywood Pao, MD  hydrOXYzine (ATARAX/VISTARIL) 25 MG tablet Take 25 mg by mouth every 6 (six) hours as needed for itching.   Yes Historical Provider, MD  isosorbide mononitrate (IMDUR) 30 MG 24 hr tablet TAKE ONE TABLET BY MOUTH ONCE DAILY Patient taking differently: TAKE 30 MG BY MOUTH ONCE DAILY 01/26/16  Yes [name], MD  metFORMIN (GLUCOPHAGE) 500 MG tablet Take 500 mg by mouth 2 (two) times daily with a meal.   Yes Historical Provider, MD  nitroGLYCERIN (NITROSTAT) 0.4 MG SL tablet Place 1 tablet (0.4 mg total) under the tongue every 5 (five) minutes as needed for chest pain. 11/21/15  Yes [name], MD  pravastatin (PRAVACHOL) 40 MG tablet Take 40 mg by mouth daily.   Yes Historical Provider, MD  traMADol (ULTRAM) 50 MG tablet Take 50 mg by mouth 2 (two) times daily.   Yes Historical Provider, MD  vitamin B-12 (CYANOCOBALAMIN) 500 MCG tablet Take 1,000 mcg by mouth daily.    Yes Historical Provider, MD  levofloxacin (LEVAQUIN) 750 MG tablet Take 1 tablet (750 mg total) by mouth daily. Patient not taking: Reported on 04/07/2016 03/15/16   Modena Jansky, MD    Physical Exam: Filed Vitals:   04/06/16 2331 04/07/16 0005 04/07/16 0217 04/07/16 0313  BP: 110/66  108/63   Pulse: 88  109   Temp: 100.9 F (38.3 C)   99.1 F (37.3 C)  TempSrc: Oral     Resp: 22  25   Weight:  175 lb (79.379 kg)    SpO2: 96%  93%       Constitutional: Mildly confused. Filed Vitals:   04/06/16 2331 04/07/16 0005 04/07/16 0217 04/07/16 0313  BP: 110/66  108/63   Pulse: 88  109   Temp: 100.9 F (38.3 C)   99.1 F (37.3 C)  TempSrc: Oral     Resp: 22  25   Weight:  175 lb (79.379 kg)      SpO2: 96%  93%    Eyes: Anicteric no pallor. ENMT: No discharge from the ears eyes nose and mouth. Neck:  Neck supple no mass felt. Respiratory: No rhonchi or crepitations. Cardiovascular: S1-S2 heard. Abdomen: Soft nontender bowel sounds present. Musculoskeletal: No edema. Skin: No rash. Neurologic: Patient is mildly confused but oriented to name and time and place and moves all extremities. Psychiatric: Mildly confused.   Labs on Admission: I have personally reviewed following labs and imaging studies  CBC:  Recent Labs Lab 04/07/16 0013  WBC 12.9*  NEUTROABS 11.0*  HGB 9.2*  HCT 27.7*  MCV 81.0  PLT 74*   Basic Metabolic Panel:  Recent Labs Lab 04/07/16 0013  NA 129*  K 4.3  CL 97*  CO2 21*  GLUCOSE 188*  BUN 27*  CREATININE 1.21  CALCIUM 9.4   GFR: Estimated Creatinine Clearance: 48.1 mL/min (by C-G formula based on Cr of 1.21). Liver Function Tests:  Recent Labs Lab 04/07/16 0013  AST 50*  ALT 31  ALKPHOS 221*  BILITOT 1.9*  PROT 5.7*  ALBUMIN 3.6   No results for input(s): LIPASE, AMYLASE in the last 168 hours. No results for input(s): AMMONIA in the last 168 hours. Coagulation Profile:  Recent Labs Lab 04/07/16 0200  INR 1.37   Cardiac Enzymes: No results for input(s): CKTOTAL, CKMB, CKMBINDEX, TROPONINI in the last 168 hours. BNP (last 3 results) No results for input(s): PROBNP in the last 8760 hours. HbA1C: No results for input(s): HGBA1C in the last 72 hours. CBG: No results for input(s): GLUCAP in the last 168 hours. Lipid Profile: No results for input(s): CHOL, HDL, LDLCALC, TRIG, CHOLHDL, LDLDIRECT in the last 72 hours. Thyroid Function Tests: No results for input(s): TSH, T4TOTAL, FREET4, T3FREE, THYROIDAB in the last 72 hours. Anemia Panel: No results for input(s): VITAMINB12, FOLATE, FERRITIN, TIBC, IRON, RETICCTPCT in the last 72 hours. Urine analysis:    Component Value Date/Time   COLORURINE AMBER* 04/07/2016 0040    APPEARANCEUR CLOUDY* 04/07/2016 0040   LABSPEC 1.017 04/07/2016 0040   LABSPEC 1.030 03/16/2012 1236   PHURINE 5.5 04/07/2016 0040   PHURINE 5.0 03/16/2012 1236   GLUCOSEU NEGATIVE 04/07/2016 0040   HGBUR NEGATIVE 04/07/2016 0040   HGBUR Trace 03/16/2012 1236   BILIRUBINUR NEGATIVE 04/07/2016 0040   BILIRUBINUR Negative 03/16/2012 1236   KETONESUR NEGATIVE 04/07/2016 0040   KETONESUR Negative 03/16/2012 1236   PROTEINUR 100* 04/07/2016 0040   PROTEINUR 30 03/16/2012 1236   UROBILINOGEN 1.0 11/19/2014 2003   NITRITE NEGATIVE 04/07/2016 0040   NITRITE Negative 03/16/2012 1236   LEUKOCYTESUR NEGATIVE 04/07/2016 0040   LEUKOCYTESUR Negative 03/16/2012 1236   Sepsis Labs: @LABRCNTIP (procalcitonin:4,lacticidven:4) )No results found for this or any previous visit (from the past 240 hour(s)).   Radiological Exams on Admission: Ct Head Wo Contrast  04/07/2016  CLINICAL DATA:  Fever, confusion, elevated white cell count. Acute encephalopathy. EXAM: CT HEAD WITHOUT CONTRAST TECHNIQUE: Contiguous axial images were obtained from the base of the skull through the vertex without intravenous contrast. COMPARISON:  07/21/2014 FINDINGS: Examination is somewhat limited by motion artifact. Diffuse cerebral atrophy. Mild ventricular dilatation consistent with central atrophy. Low-attenuation changes in the deep white matter consistent small vessel ischemia. No mass effect or midline shift. No abnormal extra-axial fluid collections. Gray-white matter junctions are distinct. Basal cisterns are not effaced. No evidence of acute intracranial hemorrhage. No depressed skull fractures. Visualized paranasal sinuses and mastoid air cells are not opacified. IMPRESSION: No acute intracranial abnormalities. Mild chronic atrophy and small vessel ischemic changes. Electronically Signed   By: Lucienne Capers M.D.   On: 04/07/2016 03:14   Dg Chest  Port 1 View  04/07/2016  CLINICAL DATA:  Fever for 48 hours. EXAM: PORTABLE  CHEST 1 VIEW COMPARISON:  03/12/2016 FINDINGS: There is moderate cardiomegaly and aortic tortuosity, unchanged. Prior sternotomy and CABG. No consolidation. No large effusions. Mild chronic appearing interstitial coarsening. IMPRESSION: Cardiomegaly.  No acute cardiopulmonary findings. Electronically Signed   By: Andreas Newport M.D.   On: 04/07/2016 00:34    EKG: Independently reviewed. Sinus tachycardia but EKG is read as A. fib but can see P waves.  Assessment/Plan Principal Problem:   SIRS (systemic inflammatory response syndrome) (HCC) Active Problems:   Essential hypertension   Mantle cell lymphoma (HCC)   S/P TAVR (transcatheter aortic valve replacement)   Type 2 diabetes mellitus with vascular disease ([place])    #1. SIRS/ Developing Sepsis source not clear - since patient has productive cough could be pulmonary. At this time I have placed patient on empiric antibiotics and check urine cultures blood cultures. If fever persist I'm going to order CT chest and abdomen without contrast to see if there is any other clear source. Follow lactic acid levels procalcitonin and continue with aggressive hydration. #2. Acute encephalopathy from developing sepsis - continue with hydration. Follow blood cultures and continue antibiotics. #3. CAD status post CABG and stenting - denies any chest pain. We will cycle cardiac markers. Continue aspirin Plavix and statins. Patient is on Imdur. #4. Hyperlipidemia on statins. #5. Diabetes mellitus type 2 - will hold metformin while inpatient and place patient on sliding scale coverage. Closely follow CBGs. #6. History of mantle cell lymphoma - in remission. If fever persists may need further workup on this.  Addendum - patient's troponin came positive. I have started patient on heparin infusion. Will give 1 dose of aspirin and Plavix. Patient is on statins. Since patient is still tachycardic I have placed patient on metoprolol 12.5 twice a day. Patient is  still febrile and source not clear CT chest abdomen and pelvis has been ordered. Patient will be transferred to stepdown unit.   DVT prophylaxis: Heparin infusion. Code Status: DO NOT RESUSCITATE.  Family Communication: Patient's wife.  Disposition Plan: Home.  Consults called: None.  Admission status: Inpatient. Likely stage 3-4 days.    Rise Patience MD [facility] Pager [phone].  If 7PM-7AM, please contact night-coverage [email or web address] Password [email or web address]  04/07/2016, 3:44 AM

## 2016-04-07 NOTE — Progress Notes (Signed)
Person NOTE  Patient Care Team: Leanna Battles, MD as PCP - General (Internal Medicine)  CHIEF COMPLAINTS/PURPOSE OF CONSULTATION:  Abnormal liver lesions, splenomegaly, history of mantle cell lymphoma  HISTORY OF PRESENTING ILLNESS:  [name] 82 y.o. male is admitted to the intensive care unit this morning after presentation with fever and chills. I review his records extensively. The patient has recurrent admission to the hospital in the past 2 months. He was just discharged recently for pneumonia. In terms of prior history of mantle cell lymphoma, he was lost to follow-up since 2014. I reviewed his electronic records and collaborated the history with the patient. The patient was diagnosed with mantle cell lymphoma in March 2013 after presentation with pancytopenia and splenomegaly. He underwent bone marrow biopsy dated 03/29/2012 which came back consistent with mantle cell lymphoma. The patient received combination chemotherapy with bendamustine and rituximab. His last dose was given around December 2014 and he subsequently cancelled his appointment and follow-up at [facility] The patient has significant major comorbidities. He has severe coronary artery disease status post bypass surgery and valvular heart disease status post surgical repair. He is on chronic anticoagulation therapy. The patient also has history of osteomyelitis and foot amputation related to that. He had serial imaging study over the years. Prior to current imaging study, CT angiogram of the abdomen and pelvis dated November 2015, showed persistent splenomegaly and abnormal liver appearance consistent with liver cirrhosis. On 03/12/2016, he has CT abdomen and pelvis without contrast which show persistent splenomegaly. On this current admission, on 04/07/2016, he underwent CT scan of the chest, abdomen and pelvis without contrast which is very difficult to read, in my opinion, which  showed several abnormal findings including persistent splenomegaly and questionable new liver lesions. The patient complained of chronic weakness since recent hospitalization & poor appetite He denies palpable lymphadenopathy He has poor performance status at home and is dependent on caregivers for most activities of daily living The patient denies any recent signs or symptoms of bleeding such as spontaneous epistaxis, hematuria or hematochezia.   MEDICAL HISTORY:  Past Medical History  Diagnosis Date  . CAD (coronary artery disease)     a. s/p CABG 1995;  b. NSTEMI & subsequent BMS to SVG->right PDA 02/03/12.;  c. Cath 02/14/2012 3vd with 4/4 patent grafts and patent stent in vg->pda;  d. 10/2014 PCI/DES to the VG->PDA.  . Carotid stenosis     a. Carotid dopplers 6/14: RICA 43-15%, LICA 4-00%;  b. dopplers 3/14:  86-76% RICA, 1-95% LICA  . HTN (hypertension)   . HLD (hyperlipidemia)   . Asthmatic bronchitis   . BPH (benign prostatic hypertrophy)   . Herpes zoster   . Anemia   . Thrombocytopenia ([place])     a. secondary to splenomegaly related to lymphoma with suspicion of bone marrow involvement. (Plavix had to be stopped due to this)  . Aortic stenosis, severe     a. ECHO 07/22/14 Severe AS. Peak and mean gradients of 57 mmHg and 42 mmHg, respectively. Calculated AVA is 0.8-0.9 cm2. Trivial AR. Valve area (VTI): 0.84 cm^2. Valve area (Vmax): 0.86 cm^2.. Aortic root dimension: 42 mm (ED) and aortic root mildly dilated;  b. 10/2014 S/P TAVR w/ Edwards Sapien 3 THV 35m;  c. 10/2014 Echo: EF 50%, nl fxning AoV, mean grad of 9, peak of 16.  .Marland KitchenSplenomegaly   . COPD (chronic obstructive pulmonary disease) ([facility]   . Peripheral vascular disease ([place]  a. s/p L ray amp 10/13;  b. s/p L 2nd toe amp 12/13  . Pulmonary nodule     a. 63m RUL calcified pulm nodule noted on CT staging for lymphoma - stable 10/2012.  . Leg DVT (deep venous thromboembolism), chronic ([facility]     a. LE dopplers 5/13, 10/13  and 1/14: chronic DVT involving right mid femoral vein, left mid femoral vein, and left popliteal vein.  . Osteomyelitis ([facility]     a. L first foot ray amputation 09/2012  . Heart murmur   . Anginal pain ([facility]   . Myocardial infarction ([facility] ?1995  . Pneumonia ?2014  . DM2 (diabetes mellitus, type 2) ([place]   . DJD (degenerative joint disease)   . 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 Stage III s/p bendamustine, Rituxan therapy  . 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 29 mm Edwards Sapien 3 transcatheter heart valve placed via open right transfemoral approach    SURGICAL HISTORY: Past Surgical History  Procedure Laterality Date  . Coronary artery bypass graft  1995  . Shoulder arthroscopy w/ rotator cuff repair Right   . Carpal tunnel release Bilateral   . Incisional hernia repair    . Kidney surgery      "cut me 1/2 in 2 for stones"  . Back surgery    . I&d extremity  09/16/2012    Procedure: IRRIGATION AND DEBRIDEMENT EXTREMITY;  Surgeon: JWylene Simmer MD;  Location: [facility];  Service: Orthopedics;  Laterality: Left;  irrigation and debridement and first Ray amputation of left foot  . Amputation  09/16/2012    Procedure: AMPUTATION RAY;  Surgeon: JWylene Simmer MD;  Location: [facility];  Service: Orthopedics;  Laterality: Left;  first Ray amputation left foot  . Amputation  11/21/2012    Procedure: AMPUTATION RAY;  Surgeon: JWylene Simmer MD;  [place]  Service: Orthopedics;  Laterality: Left;  LEFT SECOND TOE AMPUTATION  . I&d extremity Left 03/01/2013    Procedure: IRRIGATION AND DEBRIDEMENT EXTREMITY;  Surgeon: JWylene Simmer MD;  [facility]: [facility];  Service: Orthopedics;  Laterality: Left;  IRRIGATION  AND  DEBRIDEMENT  LEFT  FOOT  . Amputation Left 12/20/2013    Procedure: AMPUTATION LEFT 5TH TRANSMETATARSAL;  Surgeon: JWylene Simmer MD;  [place] [facility]  Service: Orthopedics;  Laterality: Left;  . Achilles tendon lengthening  Left 12/20/2013    Procedure: ACHILLES TENDON LENGTHENING;  Surgeon: JWylene Simmer MD;  [place]  Service: Orthopedics;  Laterality: Left;  . Hernia repair    . Coronary angioplasty with stent placement  09/2014; 10/14/2014    "?2; 1"  . Cataract extraction w/ intraocular lens  implant, bilateral Bilateral   . Transcatheter aortic valve replacement, transfemoral N/A 11/05/2014    Procedure: TRANSCATHETER AORTIC VALVE REPLACEMENT, TRANSFEMORAL;  Surgeon: MSherren Mocha MD;  Location: [facility]; EBerniece Pap3 THV (size 29 mm, model # [id number] serial # [id number]   . Intraoperative transesophageal echocardiogram N/A 11/05/2014    Procedure: INTRAOPERATIVE TRANSESOPHAGEAL ECHOCARDIOGRAM;  Surgeon: MSherren Mocha MD;  [facility];  Service: Open Heart Surgery;  Laterality: N/A;  . Left and right heart catheterization with coronary/graft angiogram N/A 02/03/2012    Procedure: LEFT AND RIGHT HEART CATHETERIZATION WITH CBeatrix Fetters  Surgeon: MSherren Mocha MD;  [facility];  Service: Cardiovascular;  Laterality: N/A;  . Percutaneous stent intervention  02/03/2012    Procedure: PERCUTANEOUS STENT INTERVENTION;  Surgeon:  [name], MD;  Location: [facility] C[facility] LAB;  Service: Cardiovascular;;  . Left heart catheterization with coronary/graft angiogram N/A 02/14/2012    Procedure: LEFT HEART CATHETERIZATION WITH Beatrix Fetters;  Surgeon: Peter M Martinique, MD;  Location: [facility] CATH LAB;  Service: Cardiovascular;  Laterality: N/A;  . Left and right heart catheterization with coronary/graft angiogram N/A 09/23/2014    Procedure: LEFT AND RIGHT HEART CATHETERIZATION WITH Beatrix Fetters;  Surgeon: Blane Ohara, MD;  Location: [facility] CATH LAB;  Service: Cardiovascular;  Laterality: N/A;  . Percutaneous coronary stent intervention (pci-s)  10/14/2014    Procedure: PERCUTANEOUS CORONARY STENT INTERVENTION (PCI-S);  Surgeon: Blane Ohara, MD;  Location: [facility] CATH LAB;   Service: Cardiovascular;;    SOCIAL HISTORY: Social History   Social History  . Marital Status: Married    Spouse Name: N/A  . Number of Children: N/A  . Years of Education: N/A   Occupational History  . retired [employer]   Social History Main Topics  . Smoking status: Former Smoker -- 1.00 packs/day for 30 years    Types: Cigarettes, Pipe    Quit date: 03/29/1985  . Smokeless tobacco: Former Systems developer    Types: Chew     Comment: "chewed for ~ 1 yr after I quit smoking"  . Alcohol Use: No  . Drug Use: No  . Sexual Activity: Not Currently   Other Topics Concern  . Not on file   Social History Narrative    FAMILY HISTORY: Family History  Problem Relation Age of Onset  . Coronary artery disease    . Alcohol abuse    . Other Mother   . Other Father     ALLERGIES:  is allergic to losartan; azithromycin; contrast media; doxycycline; keflex; macrodantin; minocycline hcl; nitrofurantoin; and zithromax.  MEDICATIONS:  Current Facility-Administered Medications  Medication Dose Route Frequency Provider Last Rate Last Dose  . 0.9 %  sodium chloride infusion   Intravenous Continuous Rise Patience, MD 125 mL/hr at 04/07/16 0630    . acetaminophen (TYLENOL) tablet 650 mg  650 mg Oral Q6H PRN Rise Patience, MD   650 mg at 04/07/16 0441  . aspirin EC tablet 81 mg  81 mg Oral Daily Rise Patience, MD   81 mg at 04/07/16 1234  . ceFEPIme (MAXIPIME) 2 g in dextrose 5 % 50 mL IVPB  2 g Intravenous Q12H Emiliano Dyer, RPH   2 g at 04/07/16 1245  . clopidogrel (PLAVIX) tablet 75 mg  75 mg Oral Daily Rise Patience, MD   75 mg at 04/07/16 1235  . feeding supplement (ENSURE ENLIVE) (ENSURE ENLIVE) liquid 237 mL  237 mL Oral TID BM Rise Patience, MD   237 mL at 04/07/16 1400  . gabapentin (NEURONTIN) capsule 300 mg  300 mg Oral BID Rise Patience, MD   300 mg at 04/07/16 1234  . heparin ADULT infusion 100 units/mL (25000 units/250 mL)  950 Units/hr  Intravenous Continuous Rise Patience, MD 9.5 mL/hr at 04/07/16 0545 950 Units/hr at 04/07/16 0545  . hydrocortisone sodium succinate (SOLU-CORTEF) 100 MG injection 100 mg  100 mg Intravenous Q8H Florencia Reasons, MD      . hydrOXYzine (ATARAX/VISTARIL) tablet 25 mg  25 mg Oral Q6H PRN Rise Patience, MD      . insulin aspart (novoLOG) injection 0-9 Units  0-9 Units Subcutaneous TID WC Rise Patience, MD   1 Units at 04/07/16 1236  . nitroGLYCERIN (NITROSTAT) SL  tablet 0.4 mg  0.4 mg Sublingual Q5 min PRN Rise Patience, MD      . pravastatin (PRAVACHOL) tablet 40 mg  40 mg Oral Daily Rise Patience, MD   40 mg at 04/07/16 1234  . umeclidinium-vilanterol (ANORO ELLIPTA) 62.5-25 MCG/INH 1 puff  1 puff Inhalation Daily Rise Patience, MD   1 puff at 04/07/16 1043  . vancomycin (VANCOCIN) IVPB 750 mg/150 ml premix  750 mg Intravenous Q12H Rise Patience, MD      . vitamin B-12 (CYANOCOBALAMIN) tablet 1,000 mcg  1,000 mcg Oral Daily Rise Patience, MD   1,000 mcg at 04/07/16 1235    REVIEW OF SYSTEMS:   Eyes: Denies blurriness of vision, double vision or watery eyes Ears, nose, mouth, throat, and face: Denies mucositis or sore throat Respiratory: Denies cough, dyspnea or wheezes Cardiovascular: Denies palpitation, chest discomfort or lower extremity swelling Gastrointestinal:  Denies nausea, heartburn or change in bowel habits Skin: Denies abnormal skin rashes Lymphatics: Denies new lymphadenopathy or easy bruising Behavioral/Psych: Mood is stable, no new changes  All other systems were reviewed with the patient and are negative.  PHYSICAL EXAMINATION: ECOG PERFORMANCE STATUS: 3 - Symptomatic, >50% confined to bed  Filed Vitals:   04/07/16 1300 04/07/16 1417  BP: 84/57 81/54  Pulse: 91 75  Temp:    Resp: 24 24   Filed Weights   04/07/16 0005 04/07/16 0416  Weight: 175 lb (79.379 kg) 177 lb 0.5 oz (80.3 kg)    GENERAL:alert, no distress and comfortable.  He looks debilitated and pale SKIN: skin color looks pale, texture, turgor are normal, no rashes or significant lesions EYES: normal, conjunctiva are pale and non-injected, sclera clear OROPHARYNX:no exudate, no erythema and lips, buccal mucosa, and tongue normal . He has dry mucous membrane on his lips NECK: supple, thyroid normal size, non-tender, without nodularity LYMPH:  no palpable lymphadenopathy in the cervical, axillary or inguinal LUNGS: clear to auscultation and percussion with normal breathing effort HEART: Irregular rate and rhythm, without murmurs and no lower extremity edema ABDOMEN:abdomen soft, non-tender and normal bowel sounds. Mild splenomegaly Musculoskeletal:no cyanosis of digits and no clubbing  PSYCH: alert & oriented x 3 with fluent speech NEURO: no focal motor/sensory deficits  LABORATORY DATA:  I have reviewed the data as listed Lab Results  Component Value Date   WBC 15.4* 04/07/2016   HGB 9.4* 04/07/2016   HCT 28.6* 04/07/2016   MCV 82.7 04/07/2016   PLT 60* 04/07/2016    Recent Labs  03/14/16 0514 04/07/16 0013 04/07/16 0421  NA 135 129* 130*  K 4.1 4.3 4.4  CL 106 97* 102  CO2 21* 21* 18*  GLUCOSE 127* 188* 195*  BUN 18 27* 28*  CREATININE 0.94 1.21 1.15  CALCIUM 9.3 9.4 8.6*  GFRNONAA >60 52* 56*  GFRAA >60 >60 >60  PROT  --  5.7* 5.1*  ALBUMIN  --  3.6 3.2*  AST  --  50* 73*  ALT  --  31 37  ALKPHOS  --  221* 220*  BILITOT  --  1.9* 2.2*    RADIOGRAPHIC STUDIES: I have personally reviewed the radiological images as listed and agreed with the findings in the report. Ct Abdomen Pelvis Wo Contrast  04/07/2016  CLINICAL DATA:  Abdominal pain.  Mild diffuse abdominal tenderness. EXAM: CT CHEST, ABDOMEN AND PELVIS WITHOUT CONTRAST TECHNIQUE: Multidetector CT imaging of the chest, abdomen and pelvis was performed following the standard protocol without IV contrast. COMPARISON:  03/12/2016 FINDINGS: CT CHEST FINDINGS Mediastinum/Lymph Nodes:  There is moderate cardiac enlargement. Calcification of the mitral valve noted. There is a prosthetic aortic valve. Previous median sternotomy and CABG procedure noted. The trachea appears patent and is midline. Normal appearance of the esophagus. No axillary or supraclavicular adenopathy. No mediastinal or hilar adenopathy. Lungs/Pleura: Small pleural effusions are identified bilaterally. There are bilateral lower lobe pulmonary opacities which appear stable to slightly improved from previous exam, image 77 of series 4. Musculoskeletal: No chest wall mass or suspicious bone lesions identified. CT ABDOMEN PELVIS FINDINGS Hepatobiliary: Hypertrophy of the lateral segment of left lobe of liver and caudate lobe of liver identified. Findings are suggestive of cirrhosis. Ill defined low-attenuation lesion within the right lobe of liver measures 1.8 cm, image 59 of series 2. This appears new from 10/08/2014 and is indeterminate. Posterior right lobe of liver low-attenuation structure measures 1.5 cm, image 65 of series 2. Also new from 10/08/2014. Gallbladder is normal. No biliary dilatation. Pancreas: No mass or inflammatory process identified on this un-enhanced exam. Spleen: The spleen is enlarged measuring 17 cm in cranial caudal dimension. Adrenals/Urinary Tract: Normal adrenal glands. Unremarkable appearance of the right kidney peer the lower pole calculus measures 6 mm, image 77 of series 2. Mild diffuse bladder wall thickening. Small diverticula arises from the right lateral wall of the bladder, image 115 of series 2 Stomach/Bowel: The stomach is normal. The small bowel loops have a normal course and caliber. There is no evidence for bowel obstruction. No pathologic dilatation of the colon. Multiple distal colonic diverticula noted. Vascular/Lymphatic: Calcified atherosclerotic disease involves the abdominal aorta. No aneurysm. No adenopathy identified within the abdomen or pelvis. Reproductive: Prostate gland and  seminal vesicles are unremarkable. Other: There is moderate ascites identified within the abdomen and pelvis. No focal fluid collections identified. Musculoskeletal: Spondylosis is identified within the lumbar spine. This is most advanced at the L5-S1 level. No aggressive lytic or sclerotic bone lesions noted. IMPRESSION: 1. No findings identified to explain patient's sepsis. 2. Morphologic features a liver are concerning for cirrhosis. 3. There are 2 indeterminate low-attenuation foci within the right lobe of liver, new from 10/08/2014. More definitive characterization with contrast enhanced MRI of the liver is suggested as patient's clinical condition tolerates. 4. Splenomegaly 5. Ascites 6. Bilateral pleural effusions with mild bilateral lower lobe pneumonitis. Aeration to the lower lobes appear slightly improved from 03/12/2016. 7. Aortic atherosclerosis 8. Lumbar spondylosis. Electronically Signed   By: Kerby Moors M.D.   On: 04/07/2016 11:58   Ct Abdomen Pelvis Wo Contrast  03/12/2016  CLINICAL DATA:  Abdominal distention and pain. EXAM: CT ABDOMEN AND PELVIS WITHOUT CONTRAST TECHNIQUE: Multidetector CT imaging of the abdomen and pelvis was performed following the standard protocol without IV contrast. COMPARISON:  07/02/2013 FINDINGS: There is unchanged splenomegaly, measuring 8.2 x 13.4 x 16 cm. No focal splenic lesion is evident. There are unremarkable unenhanced appearances of the liver, gallbladder and bile ducts. There are unremarkable unenhanced appearances of the pancreas and adrenals. There are lower pole left collecting system calculi measuring 4-6 mm, unchanged. Kidneys are otherwise unremarkable. Ureters and urinary bladder are unremarkable. Stomach and small bowel are unremarkable. Oral contrast has reached the rectum. There is mild generalized mural thickening of the colon without focal inflammation. The abdominal aorta is normal in caliber. There is mild atherosclerotic calcification. There  is no adenopathy in the abdomen or pelvis. There is no ascites. There is confluent alveolar opacity with air bronchograms in the posterior  right lower lobe base. This is present to a lesser degree in the left lower lobe base. This could represent pneumonia. There is a very small left pleural effusion. There is a trace right pleural effusion. IMPRESSION: 1. Mild generalized mural thickening of the colon and appendix without focal inflammatory change. A minimal degree of colitis might be present. There is no bowel obstruction. There is no extraluminal air. 2. Unchanged splenomegaly. 3. Left nephrolithiasis. 4. Consolidation in both posterior lung bases, right worse than left. This is more focally consolidated than would be typical for dependent atelectasis, and it may represent pneumonia. There is a very small left pleural effusion and a trace right pleural effusion. Electronically Signed   By: Andreas Newport M.D.   On: 03/12/2016 22:36   Dg Chest 2 View  03/12/2016  CLINICAL DATA:  Productive cough with yellow mucus and generalized weakness x2-3 weeks. Pt states he had a hard cough about a week ago and a bulge appeared on his lower left rib cage that bruised black and blue. Hx of diabetes, CAD, HTN EXAM: CHEST  2 VIEW COMPARISON:  11/19/2014 FINDINGS: There changes from cardiac surgery and aortic valve replacement. Cardiac silhouette is mildly enlarged. No mediastinal or hilar masses or convincing adenopathy. There is some increased opacity in the lower lobes best seen on the lateral view. This is more prominent on the left. This is likely due to at least a component of acute bronchitis. Small area of bronchopneumonia in the left lower lobe is possible. Remainder of the lungs is clear. No pleural effusion or pneumothorax. Bony thorax is grossly intact. IMPRESSION: Lower lobe increased opacity, left greater than right, consistent with bronchial wall inflammation/bronchitis. A component of left pneumonia is  possible. Electronically Signed   By: Lajean Manes M.D.   On: 03/12/2016 19:10   Ct Head Wo Contrast  04/07/2016  CLINICAL DATA:  Fever, confusion, elevated white cell count. Acute encephalopathy. EXAM: CT HEAD WITHOUT CONTRAST TECHNIQUE: Contiguous axial images were obtained from the base of the skull through the vertex without intravenous contrast. COMPARISON:  07/21/2014 FINDINGS: Examination is somewhat limited by motion artifact. Diffuse cerebral atrophy. Mild ventricular dilatation consistent with central atrophy. Low-attenuation changes in the deep white matter consistent small vessel ischemia. No mass effect or midline shift. No abnormal extra-axial fluid collections. Gray-white matter junctions are distinct. Basal cisterns are not effaced. No evidence of acute intracranial hemorrhage. No depressed skull fractures. Visualized paranasal sinuses and mastoid air cells are not opacified. IMPRESSION: No acute intracranial abnormalities. Mild chronic atrophy and small vessel ischemic changes. Electronically Signed   By: Lucienne Capers M.D.   On: 04/07/2016 03:14   Ct Chest Wo Contrast  04/07/2016  CLINICAL DATA:  Abdominal pain.  Mild diffuse abdominal tenderness. EXAM: CT CHEST, ABDOMEN AND PELVIS WITHOUT CONTRAST TECHNIQUE: Multidetector CT imaging of the chest, abdomen and pelvis was performed following the standard protocol without IV contrast. COMPARISON:  03/12/2016 FINDINGS: CT CHEST FINDINGS Mediastinum/Lymph Nodes: There is moderate cardiac enlargement. Calcification of the mitral valve noted. There is a prosthetic aortic valve. Previous median sternotomy and CABG procedure noted. The trachea appears patent and is midline. Normal appearance of the esophagus. No axillary or supraclavicular adenopathy. No mediastinal or hilar adenopathy. Lungs/Pleura: Small pleural effusions are identified bilaterally. There are bilateral lower lobe pulmonary opacities which appear stable to slightly improved from  previous exam, image 77 of series 4. Musculoskeletal: No chest wall mass or suspicious bone lesions identified. CT ABDOMEN PELVIS FINDINGS Hepatobiliary:  Hypertrophy of the lateral segment of left lobe of liver and caudate lobe of liver identified. Findings are suggestive of cirrhosis. Ill defined low-attenuation lesion within the right lobe of liver measures 1.8 cm, image 59 of series 2. This appears new from 10/08/2014 and is indeterminate. Posterior right lobe of liver low-attenuation structure measures 1.5 cm, image 65 of series 2. Also new from 10/08/2014. Gallbladder is normal. No biliary dilatation. Pancreas: No mass or inflammatory process identified on this un-enhanced exam. Spleen: The spleen is enlarged measuring 17 cm in cranial caudal dimension. Adrenals/Urinary Tract: Normal adrenal glands. Unremarkable appearance of the right kidney peer the lower pole calculus measures 6 mm, image 77 of series 2. Mild diffuse bladder wall thickening. Small diverticula arises from the right lateral wall of the bladder, image 115 of series 2 Stomach/Bowel: The stomach is normal. The small bowel loops have a normal course and caliber. There is no evidence for bowel obstruction. No pathologic dilatation of the colon. Multiple distal colonic diverticula noted. Vascular/Lymphatic: Calcified atherosclerotic disease involves the abdominal aorta. No aneurysm. No adenopathy identified within the abdomen or pelvis. Reproductive: Prostate gland and seminal vesicles are unremarkable. Other: There is moderate ascites identified within the abdomen and pelvis. No focal fluid collections identified. Musculoskeletal: Spondylosis is identified within the lumbar spine. This is most advanced at the L5-S1 level. No aggressive lytic or sclerotic bone lesions noted. IMPRESSION: 1. No findings identified to explain patient's sepsis. 2. Morphologic features a liver are concerning for cirrhosis. 3. There are 2 indeterminate low-attenuation  foci within the right lobe of liver, new from 10/08/2014. More definitive characterization with contrast enhanced MRI of the liver is suggested as patient's clinical condition tolerates. 4. Splenomegaly 5. Ascites 6. Bilateral pleural effusions with mild bilateral lower lobe pneumonitis. Aeration to the lower lobes appear slightly improved from 03/12/2016. 7. Aortic atherosclerosis 8. Lumbar spondylosis. Electronically Signed   By: Kerby Moors M.D.   On: 04/07/2016 11:58   Dg Chest Port 1 View  04/07/2016  CLINICAL DATA:  Fever for 48 hours. EXAM: PORTABLE CHEST 1 VIEW COMPARISON:  03/12/2016 FINDINGS: There is moderate cardiomegaly and aortic tortuosity, unchanged. Prior sternotomy and CABG. No consolidation. No large effusions. Mild chronic appearing interstitial coarsening. IMPRESSION: Cardiomegaly.  No acute cardiopulmonary findings. Electronically Signed   By: Andreas Newport M.D.   On: 04/07/2016 00:34    ASSESSMENT & PLAN:  History of mantle cell lymphoma I reviewed his imaging study in great details. It is not clear to me that the patient has definitive signs of recurrence of mantle cell lymphoma. He would need further workup as an outpatient including PET CT scan but I would not do it right now as the patient is critically ill  Leukocytosis secondary to sepsis, etiology unknown The patient is currently in the ICU with broad-spectrum IV antibiotics. I suspect the most likely source could be pneumonia but the patient did have history of chronic osteomyelitis and valvular heart disease. Cultures are pending  Atrial fibrillation, elevated troponin and History of TAVR, on chronic anticoagulation therapy He is currently on IV heparin. Continue aggressive medical management  Chronic splenomegaly, suspect liver cirrhosis I suspect the cause of the splenomegaly is related to liver cirrhosis although persistent mantle cell lymphoma cannot be excluded I will consider PET imaging as an  outpatient  Anemia of chronic disease This is likely due to recent treatment. The patient denies recent history of bleeding such as epistaxis, hematuria or hematochezia. He is asymptomatic from the anemia.  I will observe for now.  He does not require transfusion now  Abnormal liver function tests with elevated liver enzymes and new ascites, indeterminate liver lesions It is not clear to me that he has new liver lesions. The imaging is of poor quality without contrast enhancement. Consider MRI in the near future or PET CT scan after dismissal from the hospital  Discharge planning and goals of care The patient is elderly with major medical comorbidities and is currently critically ill Consider palliative consult in the near future if the patient does not improve He will likely remain in the ICU over the next few days I will return to follow on the patient tomorrow Continue aggressive supportive care for now The patient's code status is DO NOT RESUSCITATE  All questions were answered. The patient knows to call the clinic with any problems, questions or concerns.    [facility], Elleana Stillson, MD 04/07/2016 4:04 PM

## 2016-04-07 NOTE — Progress Notes (Signed)
Pharmacy Antibiotic Note  [name] is a 82 y.o. male admitted on 04/06/2016 with sepsis.  Pharmacy initially consulted for Vancomycin and aztreonam dosing.  Upon review of antibiotic history in Epic, patient has received multiple penicillins and cephalosporins in the past.  Spoke with Dr. Erlinda Hong who agreed to change aztreonam to cefepime.  Plan:  Continue Vancomycin 750mg  IV q12hr (after 1gm in ED)  Cefepime 2g IV q12h  Monitor renal function and adjust as needed  Follow up culture date and de-escalate as appropriate   Height: 6' (182.9 cm) Weight: 177 lb 0.5 oz (80.3 kg) IBW/kg (Calculated) : 77.6  Temp (24hrs), Avg:100.7 F (38.2 C), Min:97.5 F (36.4 C), Max:103.5 F (39.7 C)   Recent Labs Lab 04/07/16 0013 04/07/16 0027 04/07/16 0421 04/07/16 0757  WBC 12.9*  --  15.4*  --   CREATININE 1.21  --  1.15  --   LATICACIDVEN  --  2.49* 2.4* 1.9    Estimated Creatinine Clearance: 50.6 mL/min (by C-G formula based on Cr of 1.15).    Allergies  Allergen Reactions  . Losartan Other (See Comments)    Hyperkalemia > 7  . Azithromycin Itching  . Contrast Media [Iodinated Diagnostic Agents] Rash  . Doxycycline Itching       . Keflex [Cephalexin] Itching  . Macrodantin Itching  . Minocycline Hcl Itching  . Nitrofurantoin Itching  . Zithromax [Azithromycin Dihydrate] Itching    Antimicrobials this admission: Vancomycin 5/3 >> Levofloxacin 5/3 x1 Aztreonam 5/3 >> 5/3 Cefepime >>  Dose adjustments this admission: ---  Microbiology results: 5/3 BCx: sent 5/3 UCx: sent 5/2 MRSA PCR: sent  Thank you for allowing pharmacy to be a part of this patient's care.  Peggyann Juba, PharmD, BCPS Pager: [phone] 04/07/2016 8:54 AM

## 2016-04-07 NOTE — Progress Notes (Signed)
Brief ANTICOAGULATION CONSULT NOTE  Pharmacy Consult for Heparin Indication: chest pain/ACS  Assessment: 34 yoM admitted on 5/3 with fever and chills.  PMH significant for TAVR, CAD s/p CABG and PCI, Mantle cell lymphoma in remission, hyperlipidemia, and recent admission last month for pneumonia.  Noted to have elevated troponin levels while in ED and pharmacy was consulted to start Heparin IV for ACS.  Today, 04/07/2016: Heparin level: 0.39 CBC: Hgb 9.4 (low, stable near baseline ~9.5) and Plt 60 (low and decreased from admission, baseline last month appears to be > 100k) RN reports no bleeding, bruising or complications. Troponin: 1.29, 1.27  Goal of Therapy:  Heparin level 0.3-0.7 units/ml Monitor platelets by anticoagulation protocol: Yes   Plan:  Discontinue heparin IV infusion due to low platelets per MD. SCDs added for VTE prophylaxis prior to paracentesis.  Gretta Arab PharmD, BCPS Pager [phone] 04/07/2016 3:21 PM

## 2016-04-08 ENCOUNTER — Inpatient Hospital Stay (HOSPITAL_COMMUNITY): Payer: Medicare Other

## 2016-04-08 DIAGNOSIS — A409 Streptococcal sepsis, unspecified: Secondary | ICD-10-CM

## 2016-04-08 DIAGNOSIS — E10621 Type 1 diabetes mellitus with foot ulcer: Secondary | ICD-10-CM | POA: Insufficient documentation

## 2016-04-08 DIAGNOSIS — C8310 Mantle cell lymphoma, unspecified site: Secondary | ICD-10-CM

## 2016-04-08 DIAGNOSIS — E118 Type 2 diabetes mellitus with unspecified complications: Secondary | ICD-10-CM

## 2016-04-08 DIAGNOSIS — Z954 Presence of other heart-valve replacement: Secondary | ICD-10-CM

## 2016-04-08 DIAGNOSIS — I480 Paroxysmal atrial fibrillation: Secondary | ICD-10-CM | POA: Insufficient documentation

## 2016-04-08 DIAGNOSIS — R062 Wheezing: Secondary | ICD-10-CM | POA: Insufficient documentation

## 2016-04-08 DIAGNOSIS — L8990 Pressure ulcer of unspecified site, unspecified stage: Secondary | ICD-10-CM

## 2016-04-08 DIAGNOSIS — L97509 Non-pressure chronic ulcer of other part of unspecified foot with unspecified severity: Secondary | ICD-10-CM

## 2016-04-08 DIAGNOSIS — I10 Essential (primary) hypertension: Secondary | ICD-10-CM

## 2016-04-08 DIAGNOSIS — I509 Heart failure, unspecified: Secondary | ICD-10-CM

## 2016-04-08 DIAGNOSIS — A4181 Sepsis due to Enterococcus: Principal | ICD-10-CM

## 2016-04-08 LAB — CBC
HEMATOCRIT: 29.7 % — AB (ref 39.0–52.0)
HEMOGLOBIN: 9.9 g/dL — AB (ref 13.0–17.0)
MCH: 27.7 pg (ref 26.0–34.0)
MCHC: 33.3 g/dL (ref 30.0–36.0)
MCV: 83 fL (ref 78.0–100.0)
Platelets: 58 10*3/uL — ABNORMAL LOW (ref 150–400)
RBC: 3.58 MIL/uL — ABNORMAL LOW (ref 4.22–5.81)
RDW: 17.4 % — ABNORMAL HIGH (ref 11.5–15.5)
WBC: 9 10*3/uL (ref 4.0–10.5)

## 2016-04-08 LAB — PROTEIN, BODY FLUID: Total protein, fluid: <3 g/dL

## 2016-04-08 LAB — COMPREHENSIVE METABOLIC PANEL
ALBUMIN: 3 g/dL — AB (ref 3.5–5.0)
ALK PHOS: 200 U/L — AB (ref 38–126)
ALT: 40 U/L (ref 17–63)
AST: 62 U/L — ABNORMAL HIGH (ref 15–41)
Anion gap: 12 (ref 5–15)
BILIRUBIN TOTAL: 1.7 mg/dL — AB (ref 0.3–1.2)
BUN: 38 mg/dL — ABNORMAL HIGH (ref 6–20)
CALCIUM: 8.3 mg/dL — AB (ref 8.9–10.3)
CO2: 14 mmol/L — AB (ref 22–32)
Chloride: 104 mmol/L (ref 101–111)
Creatinine, Ser: 1.27 mg/dL — ABNORMAL HIGH (ref 0.61–1.24)
GFR calc non Af Amer: 49 mL/min — ABNORMAL LOW (ref >60)
GFR, EST AFRICAN AMERICAN: 57 mL/min — AB (ref >60)
GLUCOSE: 171 mg/dL — AB (ref 65–99)
Potassium: 4.6 mmol/L (ref 3.5–5.1)
SODIUM: 130 mmol/L — AB (ref 135–145)
TOTAL PROTEIN: 5.1 g/dL — AB (ref 6.5–8.1)

## 2016-04-08 LAB — ECHOCARDIOGRAM COMPLETE
HEIGHTINCHES: 72 in
Weight: 2832.47 oz

## 2016-04-08 LAB — ALBUMIN, FLUID (OTHER): Albumin, Fluid: <1 g/dL

## 2016-04-08 LAB — GLUCOSE, CAPILLARY
GLUCOSE-CAPILLARY: 196 mg/dL — AB (ref 65–99)
GLUCOSE-CAPILLARY: 220 mg/dL — AB (ref 65–99)
GLUCOSE-CAPILLARY: 157 mg/dL — AB (ref 65–99)
Glucose-Capillary: 246 mg/dL — ABNORMAL HIGH (ref 65–99)

## 2016-04-08 LAB — GRAM STAIN

## 2016-04-08 LAB — BODY FLUID CELL COUNT WITH DIFFERENTIAL
Eos, Fluid: 0 %
Lymphs, Fluid: 9 %
Monocyte-Macrophage-Serous Fluid: 19 % — ABNORMAL LOW (ref 50–90)
Neutrophil Count, Fluid: 72 % — ABNORMAL HIGH (ref 0–25)
WBC FLUID: 226 uL (ref 0–1000)

## 2016-04-08 LAB — LACTATE DEHYDROGENASE, PLEURAL OR PERITONEAL FLUID: LD, Fluid: 46 U/L — ABNORMAL HIGH (ref 3–23)

## 2016-04-08 LAB — BRAIN NATRIURETIC PEPTIDE: B Natriuretic Peptide: 3365.9 pg/mL — ABNORMAL HIGH (ref 0.0–100.0)

## 2016-04-08 LAB — TROPONIN I: Troponin I: 1.19 ng/mL (ref <0.031)

## 2016-04-08 LAB — GLUCOSE, SEROUS FLUID: Glucose, Fluid: 188 mg/dL

## 2016-04-08 LAB — LACTIC ACID, PLASMA: Lactic Acid, Venous: 1.6 mmol/L (ref 0.5–2.0)

## 2016-04-08 LAB — BILIRUBIN, DIRECT: BILIRUBIN DIRECT: 0.9 mg/dL — AB (ref 0.1–0.5)

## 2016-04-08 MED ORDER — ISOSORBIDE MONONITRATE ER 30 MG PO TB24
30.0000 mg | ORAL_TABLET | Freq: Every day | ORAL | Status: DC
  Administered 2016-04-08 – 2016-04-13 (×6): 30 mg via ORAL
  Filled 2016-04-08 (×6): qty 1

## 2016-04-08 MED ORDER — IPRATROPIUM BROMIDE 0.02 % IN SOLN
0.5000 mg | Freq: Two times a day (BID) | RESPIRATORY_TRACT | Status: DC
  Administered 2016-04-08 – 2016-04-09 (×3): 0.5 mg via RESPIRATORY_TRACT
  Filled 2016-04-08 (×3): qty 2.5

## 2016-04-08 MED ORDER — FUROSEMIDE 10 MG/ML IJ SOLN
INTRAMUSCULAR | Status: AC
  Filled 2016-04-08: qty 4

## 2016-04-08 MED ORDER — GUAIFENESIN ER 600 MG PO TB12
600.0000 mg | ORAL_TABLET | Freq: Two times a day (BID) | ORAL | Status: DC
  Administered 2016-04-08 – 2016-04-13 (×11): 600 mg via ORAL
  Filled 2016-04-08 (×15): qty 1

## 2016-04-08 MED ORDER — FUROSEMIDE 10 MG/ML IJ SOLN
40.0000 mg | Freq: Once | INTRAMUSCULAR | Status: AC
  Administered 2016-04-08: 40 mg via INTRAVENOUS

## 2016-04-08 MED ORDER — PERFLUTREN LIPID MICROSPHERE
INTRAVENOUS | Status: AC
  Filled 2016-04-08: qty 10

## 2016-04-08 MED ORDER — PERFLUTREN LIPID MICROSPHERE
1.0000 mL | INTRAVENOUS | Status: AC | PRN
  Filled 2016-04-08: qty 10

## 2016-04-08 MED ORDER — IPRATROPIUM BROMIDE 0.02 % IN SOLN
0.5000 mg | Freq: Three times a day (TID) | RESPIRATORY_TRACT | Status: DC
  Administered 2016-04-08: 0.5 mg via RESPIRATORY_TRACT
  Filled 2016-04-08: qty 2.5

## 2016-04-08 MED ORDER — ALBUTEROL SULFATE (2.5 MG/3ML) 0.083% IN NEBU: 5.0000 mg | INHALATION_SOLUTION | Freq: Once | RESPIRATORY_TRACT | Status: DC

## 2016-04-08 MED ORDER — LEVALBUTEROL HCL 1.25 MG/0.5ML IN NEBU
1.2500 mg | INHALATION_SOLUTION | Freq: Two times a day (BID) | RESPIRATORY_TRACT | Status: DC
  Administered 2016-04-08 – 2016-04-09 (×3): 1.25 mg via RESPIRATORY_TRACT
  Filled 2016-04-08 (×3): qty 0.5

## 2016-04-08 MED ORDER — FUROSEMIDE 10 MG/ML IJ SOLN
20.0000 mg | Freq: Once | INTRAMUSCULAR | Status: AC
  Administered 2016-04-08: 20 mg via INTRAVENOUS
  Filled 2016-04-08: qty 2

## 2016-04-08 MED ORDER — METOPROLOL TARTRATE 12.5 MG HALF TABLET
12.5000 mg | ORAL_TABLET | Freq: Two times a day (BID) | ORAL | Status: DC
  Administered 2016-04-08 – 2016-04-09 (×3): 12.5 mg via ORAL
  Filled 2016-04-08 (×3): qty 1

## 2016-04-08 MED ORDER — LEVALBUTEROL HCL 1.25 MG/0.5ML IN NEBU
1.2500 mg | INHALATION_SOLUTION | Freq: Three times a day (TID) | RESPIRATORY_TRACT | Status: DC
  Administered 2016-04-08: 1.25 mg via RESPIRATORY_TRACT
  Filled 2016-04-08: qty 0.5

## 2016-04-08 MED ORDER — VANCOMYCIN HCL IN DEXTROSE 750-5 MG/150ML-% IV SOLN
750.0000 mg | Freq: Two times a day (BID) | INTRAVENOUS | Status: DC
  Administered 2016-04-08 – 2016-04-09 (×2): 750 mg via INTRAVENOUS
  Filled 2016-04-08 (×3): qty 150

## 2016-04-08 NOTE — Progress Notes (Signed)
PROGRESS NOTE  KOLSTEN BESSLER [id number] DOB: 10-09-1927 DOA: 04/06/2016 PCP: Donnajean Lopes, MD  HPI/Recap of past 24 hours:  Feeling better, reported abdominal pain has resolved, denies chest pain, bp stabilized off pressor  Developed intermittent wheezing   C/o chronic post herpetic pain on right forehead, states the pain has been persistent after he had shingles 5 years ago  Assessment/Plan: Principal Problem:   SIRS (systemic inflammatory response syndrome) ([place]) Active Problems:   Essential hypertension   Mantle cell lymphoma (HCC)   S/P TAVR (transcatheter aortic valve replacement)   Type 2 diabetes mellitus with vascular disease (HCC)   Pressure ulcer   Ascites   Pyrexia   Sepsis ([place])  Sepsis/spetic shock with bacteremia: required icu consult, treated with pressor briefly on 5/3. Stress dose steroids, ivf. Broad spectrum abx. Vital stabilized on 5/4. Leukocytosis/lactic acid normalized, patient has history of TAVR, Echo pending, need to repeat blood culture to ensure clearance. Source of bacteremia? He did complain of ab pain on presentation, US paracentesis pending.  He was also treated for pna prior to this hospitalization.  Bacteremia: was on vanc/cefepime, now narrowed to unasyn only  Wheezing developed on 5/4 am, stat cxr pending, no significant sign of fluids overload, will d/c ivf, start xopenex/atrovent  H/o CAD s/p CABG with stenting: patient does has Elevated troponin in the setting of sepsis, denies chest pain, no acute chest pain, he was started on heparin drip briefly in the ED, this was stopped later due to thrombocytopenia, echo pending, he is continued on plavix/statin.  Afib: new diagnosis. CHADSvasc score 4 with dm2, age, htn. But he had thrombocytopenia, started lopressor if bp allows, echopending, cardiology consulted.  Thrombocytopenia, hopefully this will recover with treatment of sepsis. He does has splenomegaly.   noninsulin dependent  dm2, metformin held, on ssi here  HTN: bp med held on 5/3 due to septic shock, bp stabilized off pressor on 5/4. Restart imdur, start low dose lopressor.  H/o mantle cell lymphoma: CT scan with new liver lesions, splenomegaly, small pleural effusion, small ascites, US guided paracentesis ordered, oncology consulted.    Code Status: DNR  Family Communication: patient   Disposition Plan: remain in stepdown today, may be able to med tele later today or tomorrow if continue to be stable.   Consultants:  pccm  Oncology  cardiology  Procedures:  US guided paracentesis  Antibiotics:  Vanc/cefepime on 5/3  unasyn on 5/4---   Objective: BP 149/66 mmHg  Pulse 47  Temp(Src) 97 F (36.1 C) (Oral)  Resp 22  Ht 6' (1.829 m)  Wt 80.3 kg (177 lb 0.5 oz)  BMI 24.00 kg/m2  SpO2 100%  Intake/Output Summary (Last 24 hours) at 04/08/16 0830 Last data filed at 04/08/16 0700  Gross per 24 hour  Intake   2400 ml  Output    702 ml  Net   1698 ml   Filed Weights   04/07/16 0005 04/07/16 0416  Weight: 79.379 kg (175 lb) 80.3 kg (177 lb 0.5 oz)    Exam:   General:  frail  Cardiovascular: IRRR  Respiratory: bilateral wheezing  Abdomen: Soft/ND/NT, positive BS  Musculoskeletal: No Edema, 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  Neuro: not oriented to time but to place and person  Data Reviewed: Basic Metabolic Panel:  Recent Labs Lab 04/07/16 0013 04/07/16 0421 04/07/16 1730 04/08/16 0322  NA 129* 130* 132* 130*  K 4.3 4.4 5.0 4.6  CL 97* 102 105 104  CO2 21* 18*  17* 14*  GLUCOSE 188* 195* 117* 171*  BUN 27* 28* 32* 38*  CREATININE 1.21 1.15 1.23 1.27*  CALCIUM 9.4 8.6* 8.0* 8.3*   Liver Function Tests:  Recent Labs Lab 04/07/16 0013 04/07/16 0421 04/07/16 1730 04/08/16 0322  AST 50* 73* 56* 62*  ALT 31 37 36 40  ALKPHOS 221* 220* 192* 200*  BILITOT 1.9* 2.2* 1.7* 1.7*  PROT 5.7* 5.1* 4.9*  4.8* 5.1*  ALBUMIN 3.6 3.2* 2.9*  2.9* 3.0*   No  results for input(s): LIPASE, AMYLASE in the last 168 hours. No results for input(s): AMMONIA in the last 168 hours. CBC:  Recent Labs Lab 04/07/16 0013 04/07/16 0421 04/08/16 0322  WBC 12.9* 15.4* 9.0  NEUTROABS 11.0* 13.1*  --   HGB 9.2* 9.4* 9.9*  HCT 27.7* 28.6* 29.7*  MCV 81.0 82.7 83.0  PLT 74* 60* 58*   Cardiac Enzymes:    Recent Labs Lab 04/07/16 0421 04/07/16 1013 04/07/16 1730 04/08/16 0322  TROPONINI 1.29* 1.27* 1.56* 1.19*   BNP (last 3 results)  Recent Labs  03/12/16 1856  BNP 2301.3*    ProBNP (last 3 results) No results for input(s): PROBNP in the last 8760 hours.  CBG:  Recent Labs Lab 04/07/16 0946 04/07/16 1212 04/07/16 1610 04/07/16 2002  GLUCAP 136* 138* 120* 108*    Recent Results (from the past 240 hour(s))  Blood Culture (routine x 2)     Status: None (Preliminary result)   Collection Time: 04/07/16 12:13 AM  Result Value Ref Range Status   Specimen Description BLOOD RIGHT ANTECUBITAL  Final   Special Requests BOTTLES DRAWN AEROBIC AND ANAEROBIC 5CC  Final   Culture  Setup Time   Final    GRAM POSITIVE COCCI IN CHAINS IN PAIRS IN BOTH AEROBIC AND ANAEROBIC BOTTLES Organism ID to follow CRITICAL RESULT CALLED TO, READ BACK BY AND VERIFIED WITH: C SHADE PHARMD 2027 04/07/16 A BROWNING Performed at [facility]    Culture PENDING  Incomplete   Report Status PENDING  Incomplete  Blood Culture (routine x 2)     Status: None (Preliminary result)   Collection Time: 04/07/16 12:13 AM  Result Value Ref Range Status   Specimen Description BLOOD LEFT HAND  Final   Special Requests   Final    BOTTLES DRAWN AEROBIC AND ANAEROBIC 4CC Performed at [facility]    Culture PENDING  Incomplete   Report Status PENDING  Incomplete  Blood Culture ID Panel (Reflexed)     Status: Abnormal   Collection Time: 04/07/16 12:13 AM  Result Value Ref Range Status   Enterococcus species DETECTED (A) NOT DETECTED Final    Comment:  CRITICAL RESULT CALLED TO, READ BACK BY AND VERIFIED WITH: C SHADE PHARMD 2027 04/07/16 A BROWNING    Vancomycin resistance NOT DETECTED NOT DETECTED Final   Listeria monocytogenes NOT DETECTED NOT DETECTED Final   Staphylococcus species NOT DETECTED NOT DETECTED Final   Staphylococcus aureus NOT DETECTED NOT DETECTED Final   Methicillin resistance NOT DETECTED NOT DETECTED Final   Streptococcus species NOT DETECTED NOT DETECTED Final   Streptococcus agalactiae NOT DETECTED NOT DETECTED Final   Streptococcus pneumoniae NOT DETECTED NOT DETECTED Final   Streptococcus pyogenes NOT DETECTED NOT DETECTED Final   Acinetobacter baumannii NOT DETECTED NOT DETECTED Final   Enterobacteriaceae species NOT DETECTED NOT DETECTED Final   Enterobacter cloacae complex NOT DETECTED NOT DETECTED Final   Escherichia coli NOT DETECTED NOT DETECTED Final   Klebsiella oxytoca NOT  DETECTED NOT DETECTED Final   Klebsiella pneumoniae NOT DETECTED NOT DETECTED Final   Proteus species NOT DETECTED NOT DETECTED Final   Serratia marcescens NOT DETECTED NOT DETECTED Final   Carbapenem resistance NOT DETECTED NOT DETECTED Final   Haemophilus influenzae NOT DETECTED NOT DETECTED Final   Neisseria meningitidis NOT DETECTED NOT DETECTED Final   Pseudomonas aeruginosa NOT DETECTED NOT DETECTED Final   Candida albicans NOT DETECTED NOT DETECTED Final   Candida glabrata NOT DETECTED NOT DETECTED Final   Candida krusei NOT DETECTED NOT DETECTED Final   Candida parapsilosis NOT DETECTED NOT DETECTED Final   Candida tropicalis NOT DETECTED NOT DETECTED Final    Comment: Performed at [facility]  MRSA PCR Screening     Status: None   Collection Time: 04/07/16  7:32 AM  Result Value Ref Range Status   MRSA by PCR NEGATIVE NEGATIVE Final    Comment:        The GeneXpert MRSA Assay (FDA approved for NASAL specimens only), is one component of a comprehensive MRSA colonization surveillance program. It is  not intended to diagnose MRSA infection nor to guide or monitor treatment for MRSA infections.      Studies: Ct Abdomen Pelvis Wo Contrast  04/07/2016  CLINICAL DATA:  Abdominal pain.  Mild diffuse abdominal tenderness. EXAM: CT CHEST, ABDOMEN AND PELVIS WITHOUT CONTRAST TECHNIQUE: Multidetector CT imaging of the chest, abdomen and pelvis was performed following the standard protocol without IV contrast. COMPARISON:  03/12/2016 FINDINGS: CT CHEST FINDINGS Mediastinum/Lymph Nodes: There is moderate cardiac enlargement. Calcification of the mitral valve noted. There is a prosthetic aortic valve. Previous median sternotomy and CABG procedure noted. The trachea appears patent and is midline. Normal appearance of the esophagus. No axillary or supraclavicular adenopathy. No mediastinal or hilar adenopathy. Lungs/Pleura: Small pleural effusions are identified bilaterally. There are bilateral lower lobe pulmonary opacities which appear stable to slightly improved from previous exam, image 77 of series 4. Musculoskeletal: No chest wall mass or suspicious bone lesions identified. CT ABDOMEN PELVIS FINDINGS Hepatobiliary: Hypertrophy of the lateral segment of left lobe of liver and caudate lobe of liver identified. Findings are suggestive of cirrhosis. Ill defined low-attenuation lesion within the right lobe of liver measures 1.8 cm, image 59 of series 2. This appears new from 10/08/2014 and is indeterminate. Posterior right lobe of liver low-attenuation structure measures 1.5 cm, image 65 of series 2. Also new from 10/08/2014. Gallbladder is normal. No biliary dilatation. Pancreas: No mass or inflammatory process identified on this un-enhanced exam. Spleen: The spleen is enlarged measuring 17 cm in cranial caudal dimension. Adrenals/Urinary Tract: Normal adrenal glands. Unremarkable appearance of the right kidney peer the lower pole calculus measures 6 mm, image 77 of series 2. Mild diffuse bladder wall thickening.  Small diverticula arises from the right lateral wall of the bladder, image 115 of series 2 Stomach/Bowel: The stomach is normal. The small bowel loops have a normal course and caliber. There is no evidence for bowel obstruction. No pathologic dilatation of the colon. Multiple distal colonic diverticula noted. Vascular/Lymphatic: Calcified atherosclerotic disease involves the abdominal aorta. No aneurysm. No adenopathy identified within the abdomen or pelvis. Reproductive: Prostate gland and seminal vesicles are unremarkable. Other: There is moderate ascites identified within the abdomen and pelvis. No focal fluid collections identified. Musculoskeletal: Spondylosis is identified within the lumbar spine. This is most advanced at the L5-S1 level. No aggressive lytic or sclerotic bone lesions noted. IMPRESSION: 1. No findings identified to explain patient's sepsis.  2. Morphologic features a liver are concerning for cirrhosis. 3. There are 2 indeterminate low-attenuation foci within the right lobe of liver, new from 10/08/2014. More definitive characterization with contrast enhanced MRI of the liver is suggested as patient's clinical condition tolerates. 4. Splenomegaly 5. Ascites 6. Bilateral pleural effusions with mild bilateral lower lobe pneumonitis. Aeration to the lower lobes appear slightly improved from 03/12/2016. 7. Aortic atherosclerosis 8. Lumbar spondylosis. Electronically Signed   By: Kerby Moors M.D.   On: 04/07/2016 11:58   Ct Chest Wo Contrast  04/07/2016  CLINICAL DATA:  Abdominal pain.  Mild diffuse abdominal tenderness. EXAM: CT CHEST, ABDOMEN AND PELVIS WITHOUT CONTRAST TECHNIQUE: Multidetector CT imaging of the chest, abdomen and pelvis was performed following the standard protocol without IV contrast. COMPARISON:  03/12/2016 FINDINGS: CT CHEST FINDINGS Mediastinum/Lymph Nodes: There is moderate cardiac enlargement. Calcification of the mitral valve noted. There is a prosthetic aortic valve.  Previous median sternotomy and CABG procedure noted. The trachea appears patent and is midline. Normal appearance of the esophagus. No axillary or supraclavicular adenopathy. No mediastinal or hilar adenopathy. Lungs/Pleura: Small pleural effusions are identified bilaterally. There are bilateral lower lobe pulmonary opacities which appear stable to slightly improved from previous exam, image 77 of series 4. Musculoskeletal: No chest wall mass or suspicious bone lesions identified. CT ABDOMEN PELVIS FINDINGS Hepatobiliary: Hypertrophy of the lateral segment of left lobe of liver and caudate lobe of liver identified. Findings are suggestive of cirrhosis. Ill defined low-attenuation lesion within the right lobe of liver measures 1.8 cm, image 59 of series 2. This appears new from 10/08/2014 and is indeterminate. Posterior right lobe of liver low-attenuation structure measures 1.5 cm, image 65 of series 2. Also new from 10/08/2014. Gallbladder is normal. No biliary dilatation. Pancreas: No mass or inflammatory process identified on this un-enhanced exam. Spleen: The spleen is enlarged measuring 17 cm in cranial caudal dimension. Adrenals/Urinary Tract: Normal adrenal glands. Unremarkable appearance of the right kidney peer the lower pole calculus measures 6 mm, image 77 of series 2. Mild diffuse bladder wall thickening. Small diverticula arises from the right lateral wall of the bladder, image 115 of series 2 Stomach/Bowel: The stomach is normal. The small bowel loops have a normal course and caliber. There is no evidence for bowel obstruction. No pathologic dilatation of the colon. Multiple distal colonic diverticula noted. Vascular/Lymphatic: Calcified atherosclerotic disease involves the abdominal aorta. No aneurysm. No adenopathy identified within the abdomen or pelvis. Reproductive: Prostate gland and seminal vesicles are unremarkable. Other: There is moderate ascites identified within the abdomen and pelvis. No  focal fluid collections identified. Musculoskeletal: Spondylosis is identified within the lumbar spine. This is most advanced at the L5-S1 level. No aggressive lytic or sclerotic bone lesions noted. IMPRESSION: 1. No findings identified to explain patient's sepsis. 2. Morphologic features a liver are concerning for cirrhosis. 3. There are 2 indeterminate low-attenuation foci within the right lobe of liver, new from 10/08/2014. More definitive characterization with contrast enhanced MRI of the liver is suggested as patient's clinical condition tolerates. 4. Splenomegaly 5. Ascites 6. Bilateral pleural effusions with mild bilateral lower lobe pneumonitis. Aeration to the lower lobes appear slightly improved from 03/12/2016. 7. Aortic atherosclerosis 8. Lumbar spondylosis. Electronically Signed   By: Kerby Moors M.D.   On: 04/07/2016 11:58    Scheduled Meds: . ampicillin-sulbactam (UNASYN) IV  3 g Intravenous Q6H  . aspirin EC  81 mg Oral Daily  . clopidogrel  75 mg Oral Daily  . feeding  supplement (ENSURE ENLIVE)  237 mL Oral TID BM  . gabapentin  300 mg Oral BID  . hydrocortisone sod succinate (SOLU-CORTEF) inj  50 mg Intravenous Q6H  . insulin aspart  0-9 Units Subcutaneous TID WC  . isosorbide mononitrate  30 mg Oral Daily  . metoprolol tartrate  12.5 mg Oral BID  . pravastatin  40 mg Oral Daily  . umeclidinium-vilanterol  1 puff Inhalation Daily  . vitamin B-12  1,000 mcg Oral Daily    Continuous Infusions: . phenylephrine (NEO-SYNEPHRINE) Adult infusion 10 mcg/min (04/07/16 1718)     Time spent: 68mins  Bethany Hirt MD, PhD  [facility] Pager [phone]. If 7PM-7AM, please contact night-coverage at [email or web address], password [facility] 04/08/2016, 8:30 AM  LOS: 1 day

## 2016-04-08 NOTE — Progress Notes (Signed)
Echocardiogram 2D Echocardiogram with Definity has been performed.  Tresa Res 04/08/2016, 2:49 PM

## 2016-04-08 NOTE — Progress Notes (Addendum)
PHARMACY - PHYSICIAN COMMUNICATION CRITICAL VALUE ALERT - BLOOD CULTURE IDENTIFICATION (BCID)  BCID result received:  [ ]  Vancomycin resistant enterococcus (VRE) [x ] Enterococcus spp (no resistance detected) 4 of 4 GPC in chains and pairs consistent with Enterococcus [ ]  Listeria monocytogenes [ ]  Oxacillin-resistant coagulase negative staphylococcus [ ]  Oxacillin-susceptible coagulase negative staphylococcus [ ]  Methicillin-resistant Staphylococcus aureus (MRSA) [ ]  Methicillin-susceptible Staphylococcus aureus (MSSA) [ ]  Streptococcus agalactiae (Group B strep) [ ]  Streptococcus pneumoniae [ ]  Streptococcus pyogenes (Group A strep) [ ]  Streptococcus spp.  [ ]  Acinetobacter baumannii [ ]  Enterobacter cloacae [ ]  Enterobacter cloacae (Carbapenem resistance detected) [ ]  Escherichia coli [ ]  Escherichia coli (Carbapenem resistance detected) [ ]  Haemophilus influenza [ ]  Klebsiella oxytoca [ ]  Klebsiella oxytoca (Carbapenem resistance detected) [ ]  Klebsiella pneumoniae [ ]  Klebsiella pneumoniae (Carbapenem resistance detected) [ ]  Neisseria meningitidis [ ]  Proteus spp. [ ]  Proteus spp. (Carbapenem resistance detected) [ ]  Pseudomonas aeruginosa [ ]  Pseudomonas aeruginosa (Carbapenem resistance detected) [ ]  Serratia marcescens [ ]  Candida _______ albicans, glabrata, krusei, parapsilosis, tropicalis (fill in blank with correct species)  Name of physician (or Provider) Contacted: Florencia Reasons and [place]  Changes to prescribed antibiotics required: Continue Unasyn and add Vancomycin pending sensitivies. ID will see patient later today.  Peggyann Juba, PharmD, BCPS Pager: [phone] 04/08/2016 10:18 AM

## 2016-04-08 NOTE — Progress Notes (Signed)
PULMONARY / CRITICAL CARE MEDICINE   Name: [name] MRN: [id number] DOB: [birth year]    ADMISSION DATE:  04/06/2016 CONSULTATION DATE:  04/07/16  REFERRING MD:  Dr Alvy Bimler  Reason for consult:  shock  HISTORY OF PRESENT ILLNESS:   82 yo man, extensive PMH including DM, PVD, COPD, CAD w remote CABG, TAVR for severe AS in 11/2014. Mantle cell lymphoma c/b cytopenias and splenomegaly, felt to be in remission.  Was admitted from 4/7 - 4/10 with an apparent CAP.  Apparently continued to have cough and some dyspnea after treatment. Was readmitted on 5/3 with abdominal discomfort, fullness and poor PO intake. Denied N/V. He developed a fever and chills. He was admitted with suspected sepsis, source unclear. A CT CAP was performed as detailed below - shows some B basilar rounded infiltrate that is improved compared with 03/12/16. There are very small b effusions. Changes in the liver consistent w cirrhosis, new infiltrative lesions concerning for metastatic disease, ascites. He had received 4L IVF resuscitation, is on broad spectrum abx. He was moved to ICU 5/3 am with persistent hypotension. He was started on low dose phenylephrine via PIV with stabilization of BP. His code status has been discussed with [facility] > he is DNR/I, would be accepting of pressors for support through reversible process. PCCM asked to evaluate in the ICU.    SUBJECTIVE:  Denies pain currently  VITAL SIGNS: BP 137/90 mmHg  Pulse 36  Temp(Src) 97.3 F (36.3 C) (Oral)  Resp 24  Ht 6' (1.829 m)  Wt 177 lb 0.5 oz (80.3 kg)  BMI 24.00 kg/m2  SpO2 99%  HEMODYNAMICS:    VENTILATOR SETTINGS:    INTAKE / OUTPUT: I/O last 3 completed shifts: In: [id number] [P.O.:300; I.V.:3125; WM:[id number]; IV Piggyback:400] Out: 702 [Urine:700; Stool:2]  PHYSICAL EXAMINATION: General: weak elderly man, NAD on RA Neuro:  Awake and alert, oriented to self, time and place, moves all ext HEENT:  Op dry, PERRL Cardiovascular:  Regular, 2/6 syst  M Lungs:  Clear bilaterally w/ occ upper airway wheeze  Abdomen:  Slightly distended, tender diffusely to palp Musculoskeletal:  No deformities, prior foot amputation  Skin: no rash  LABS:  BMET  Recent Labs Lab 04/07/16 0421 04/07/16 1730 04/08/16 0322  NA 130* 132* 130*  K 4.4 5.0 4.6  CL 102 105 104  CO2 18* 17* 14*  BUN 28* 32* 38*  CREATININE 1.15 1.23 1.27*  GLUCOSE 195* 117* 171*    Electrolytes  Recent Labs Lab 04/07/16 0421 04/07/16 1730 04/08/16 0322  CALCIUM 8.6* 8.0* 8.3*    CBC  Recent Labs Lab 04/07/16 0013 04/07/16 0421 04/08/16 0322  WBC 12.9* 15.4* 9.0  HGB 9.2* 9.4* 9.9*  HCT 27.7* 28.6* 29.7*  PLT 74* 60* 58*    Coag's  Recent Labs Lab 04/07/16 0200  APTT 36  INR 1.37    Sepsis Markers  Recent Labs Lab 04/07/16 0421  04/07/16 1013 04/07/16 1730 04/08/16 0322  LATICACIDVEN 2.4*  < > 2.2* 1.8 1.6  PROCALCITON 2.57  --   --   --   --   < > = values in this interval not displayed.  ABG No results for input(s): PHART, PCO2ART, PO2ART in the last 168 hours.  Liver Enzymes  Recent Labs Lab 04/07/16 0421 04/07/16 1730 04/08/16 0322  AST 73* 56* 62*  ALT 37 36 40  ALKPHOS 220* 192* 200*  BILITOT 2.2* 1.7* 1.7*  ALBUMIN 3.2* 2.9*  2.9* 3.0*    Cardiac  Enzymes  Recent Labs Lab 04/07/16 1013 04/07/16 1730 04/08/16 0322  TROPONINI 1.27* 1.56* 1.19*    Glucose  Recent Labs Lab 04/07/16 0946 04/07/16 1212 04/07/16 1610 04/07/16 2002 04/08/16 0830  GLUCAP 136* 138* 120* 108* 196*    Imaging Ct Abdomen Pelvis Wo Contrast  04/07/2016  CLINICAL DATA:  Abdominal pain.  Mild diffuse abdominal tenderness. EXAM: CT CHEST, ABDOMEN AND PELVIS WITHOUT CONTRAST TECHNIQUE: Multidetector CT imaging of the chest, abdomen and pelvis was performed following the standard protocol without IV contrast. COMPARISON:  03/12/2016 FINDINGS: CT CHEST FINDINGS Mediastinum/Lymph Nodes: There is moderate cardiac enlargement.  Calcification of the mitral valve noted. There is a prosthetic aortic valve. Previous median sternotomy and CABG procedure noted. The trachea appears patent and is midline. Normal appearance of the esophagus. No axillary or supraclavicular adenopathy. No mediastinal or hilar adenopathy. Lungs/Pleura: Small pleural effusions are identified bilaterally. There are bilateral lower lobe pulmonary opacities which appear stable to slightly improved from previous exam, image 77 of series 4. Musculoskeletal: No chest wall mass or suspicious bone lesions identified. CT ABDOMEN PELVIS FINDINGS Hepatobiliary: Hypertrophy of the lateral segment of left lobe of liver and caudate lobe of liver identified. Findings are suggestive of cirrhosis. Ill defined low-attenuation lesion within the right lobe of liver measures 1.8 cm, image 59 of series 2. This appears new from 10/08/2014 and is indeterminate. Posterior right lobe of liver low-attenuation structure measures 1.5 cm, image 65 of series 2. Also new from 10/08/2014. Gallbladder is normal. No biliary dilatation. Pancreas: No mass or inflammatory process identified on this un-enhanced exam. Spleen: The spleen is enlarged measuring 17 cm in cranial caudal dimension. Adrenals/Urinary Tract: Normal adrenal glands. Unremarkable appearance of the right kidney peer the lower pole calculus measures 6 mm, image 77 of series 2. Mild diffuse bladder wall thickening. Small diverticula arises from the right lateral wall of the bladder, image 115 of series 2 Stomach/Bowel: The stomach is normal. The small bowel loops have a normal course and caliber. There is no evidence for bowel obstruction. No pathologic dilatation of the colon. Multiple distal colonic diverticula noted. Vascular/Lymphatic: Calcified atherosclerotic disease involves the abdominal aorta. No aneurysm. No adenopathy identified within the abdomen or pelvis. Reproductive: Prostate gland and seminal vesicles are unremarkable.  Other: There is moderate ascites identified within the abdomen and pelvis. No focal fluid collections identified. Musculoskeletal: Spondylosis is identified within the lumbar spine. This is most advanced at the L5-S1 level. No aggressive lytic or sclerotic bone lesions noted. IMPRESSION: 1. No findings identified to explain patient's sepsis. 2. Morphologic features a liver are concerning for cirrhosis. 3. There are 2 indeterminate low-attenuation foci within the right lobe of liver, new from 10/08/2014. More definitive characterization with contrast enhanced MRI of the liver is suggested as patient's clinical condition tolerates. 4. Splenomegaly 5. Ascites 6. Bilateral pleural effusions with mild bilateral lower lobe pneumonitis. Aeration to the lower lobes appear slightly improved from 03/12/2016. 7. Aortic atherosclerosis 8. Lumbar spondylosis. Electronically Signed   By: Kerby Moors M.D.   On: 04/07/2016 11:58   Ct Chest Wo Contrast  04/07/2016  CLINICAL DATA:  Abdominal pain.  Mild diffuse abdominal tenderness. EXAM: CT CHEST, ABDOMEN AND PELVIS WITHOUT CONTRAST TECHNIQUE: Multidetector CT imaging of the chest, abdomen and pelvis was performed following the standard protocol without IV contrast. COMPARISON:  03/12/2016 FINDINGS: CT CHEST FINDINGS Mediastinum/Lymph Nodes: There is moderate cardiac enlargement. Calcification of the mitral valve noted. There is a prosthetic aortic valve. Previous median sternotomy and  CABG procedure noted. The trachea appears patent and is midline. Normal appearance of the esophagus. No axillary or supraclavicular adenopathy. No mediastinal or hilar adenopathy. Lungs/Pleura: Small pleural effusions are identified bilaterally. There are bilateral lower lobe pulmonary opacities which appear stable to slightly improved from previous exam, image 77 of series 4. Musculoskeletal: No chest wall mass or suspicious bone lesions identified. CT ABDOMEN PELVIS FINDINGS Hepatobiliary:  Hypertrophy of the lateral segment of left lobe of liver and caudate lobe of liver identified. Findings are suggestive of cirrhosis. Ill defined low-attenuation lesion within the right lobe of liver measures 1.8 cm, image 59 of series 2. This appears new from 10/08/2014 and is indeterminate. Posterior right lobe of liver low-attenuation structure measures 1.5 cm, image 65 of series 2. Also new from 10/08/2014. Gallbladder is normal. No biliary dilatation. Pancreas: No mass or inflammatory process identified on this un-enhanced exam. Spleen: The spleen is enlarged measuring 17 cm in cranial caudal dimension. Adrenals/Urinary Tract: Normal adrenal glands. Unremarkable appearance of the right kidney peer the lower pole calculus measures 6 mm, image 77 of series 2. Mild diffuse bladder wall thickening. Small diverticula arises from the right lateral wall of the bladder, image 115 of series 2 Stomach/Bowel: The stomach is normal. The small bowel loops have a normal course and caliber. There is no evidence for bowel obstruction. No pathologic dilatation of the colon. Multiple distal colonic diverticula noted. Vascular/Lymphatic: Calcified atherosclerotic disease involves the abdominal aorta. No aneurysm. No adenopathy identified within the abdomen or pelvis. Reproductive: Prostate gland and seminal vesicles are unremarkable. Other: There is moderate ascites identified within the abdomen and pelvis. No focal fluid collections identified. Musculoskeletal: Spondylosis is identified within the lumbar spine. This is most advanced at the L5-S1 level. No aggressive lytic or sclerotic bone lesions noted. IMPRESSION: 1. No findings identified to explain patient's sepsis. 2. Morphologic features a liver are concerning for cirrhosis. 3. There are 2 indeterminate low-attenuation foci within the right lobe of liver, new from 10/08/2014. More definitive characterization with contrast enhanced MRI of the liver is suggested as patient's  clinical condition tolerates. 4. Splenomegaly 5. Ascites 6. Bilateral pleural effusions with mild bilateral lower lobe pneumonitis. Aeration to the lower lobes appear slightly improved from 03/12/2016. 7. Aortic atherosclerosis 8. Lumbar spondylosis. Electronically Signed   By: Kerby Moors M.D.   On: 04/07/2016 11:58   Dg Chest Port 1 View  04/08/2016  CLINICAL DATA:  Wheezing, cough. EXAM: PORTABLE CHEST 1 VIEW COMPARISON:  Apr 06, 2016. FINDINGS: Stable cardiomegaly. Status post Coronary artery bypass graft. No pneumothorax or pleural effusion is noted. Stable interstitial densities are noted throughout both lungs most consistent with scarring. Bony thorax appears intact. IMPRESSION: Stable interstitial densities are noted throughout both lungs most consistent with scarring. No significant changes noted compared to prior exam. Electronically Signed   By: Marijo Conception, M.D.   On: 04/08/2016 09:19     STUDIES:   CULTURES: Blood 5/3 >> enetrococcus  Urine 5/3 >>  Ascites 5/4>>>  ANTIBIOTICS: levaquin 5/3 >> single dose Cefepime 5/3 >> 5/3 vanco 5/3 >> 5/3 unasyn 5/3>>>  SIGNIFICANT EVENTS:  LINES/TUBES:  DISCUSSION: Enterococcus bacteremia Septic shock-->now off pressors AF Elevated Trop I  Ascites w/ new liver lesion and evidence of cirrhosis  Mantle cell lymphoma (HCC) AKI S/P TAVR (transcatheter aortic valve replacement) Type 2 diabetes mellitus with vascular disease (HCC) Pressure ulcer  82 yo man, admitted with fever / chills and progressive hypotension, clinical picture consistent with sepsis. He  is growing Enterococcus in blood. Potential sources most-likely  SBP or UTI. Doubt PNA. Will need to further evaluate the new liver lesion and cirrhosis. Doubt that this will be anything that can be treated but should help w/ prognostics for family.   Recs:  Keep euvolemic Cont abx F/u para labs  D/c stress dose steroids there does not appear to be enough pleural fluid to  tap Cont unasyn (mindful that enterococcus can be amp resistant so need to follow final sensitivities) continue Anoro PCCM will s/o  Erick Colace ACNP-BC [place] Pager # [phone] OR # [phone] if no answer

## 2016-04-08 NOTE — Consult Note (Addendum)
Date of Admission:  04/06/2016  Date of Consult:  04/08/2016  Reason for Consult: Enterococcal bacteremia in patient with prosthetic heart valve Referring Physician: Terrilyn Saver "auto consult" and Dr. Erlinda Hong   HPI: [name] is an 82 y.o. male. With hx of mantle cell lymphoma, CAD, severe AS sp TAVR with bioprosthetic heart valve admitted with fever, chills and septic shock from enterococcus that is in his blood and urine. He is tachypnic and was hypotensive requiring fluid resuscitation, broad spectrum abx.   CT chest abd and pelvis showed bilateral pleural effusions, cirrhosis of the liver with ascites  TTE has been performed and results pending. US paracentesis also has been performed with fluid appearing more transudate.   Past Medical History  Diagnosis Date  . CAD (coronary artery disease)     a. s/p CABG 1995;  b. NSTEMI & subsequent BMS to SVG->right PDA 02/03/12.;  c. Cath 02/14/2012 3vd with 4/4 patent grafts and patent stent in vg->pda;  d. 10/2014 PCI/DES to the VG->PDA.  . Carotid stenosis     a. Carotid dopplers 4/69: RICA 62-95%, LICA 2-84%;  b. dopplers 3/14:  13-24% RICA, 4-01% LICA  . HTN (hypertension)   . HLD (hyperlipidemia)   . Asthmatic bronchitis   . BPH (benign prostatic hypertrophy)   . Herpes zoster   . Anemia   . Thrombocytopenia ([place])     a. secondary to splenomegaly related to lymphoma with suspicion of bone marrow involvement. (Plavix had to be stopped due to this)  . Aortic stenosis, severe     a. ECHO 07/22/14 Severe AS. Peak and mean gradients of 57 mmHg and 42 mmHg, respectively. Calculated AVA is 0.8-0.9 cm2. Trivial AR. Valve area (VTI): 0.84 cm^2. Valve area (Vmax): 0.86 cm^2.. Aortic root dimension: 42 mm (ED) and aortic root mildly dilated;  b. 10/2014 S/P TAVR w/ Edwards Sapien 3 THV 61m;  c. 10/2014 Echo: EF 50%, nl fxning AoV, mean grad of 9, peak of 16.  .Marland KitchenSplenomegaly   . COPD (chronic obstructive pulmonary disease) ([place]   .  Peripheral vascular disease ([place]     a. s/p L ray amp 10/13;  b. s/p L 2nd toe amp 12/13  . Pulmonary nodule     a. 824mRUL calcified pulm nodule noted on CT staging for lymphoma - stable 10/2012.  . Leg DVT (deep venous thromboembolism), chronic ([place]    a. LE dopplers 5/13, 10/13 and 1/14: chronic DVT involving right mid femoral vein, left mid femoral vein, and left popliteal vein.  . Osteomyelitis ([place]    a. L first foot ray amputation 09/2012  . Heart murmur   . Anginal pain ([place]  . [facility]  . Pneumonia ?2014  . DM2 (diabetes mellitus, type 2) ([place]  . DJD (degenerative joint disease)   . 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 Stage III s/p bendamustine, Rituxan therapy  . 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 29 mm Edwards Sapien 3 transcatheter heart valve placed via open right transfemoral approach    Past Surgical History  Procedure Laterality Date  . Coronary artery bypass graft  1995  . Shoulder arthroscopy w/ rotator cuff repair Right   . Carpal tunnel release Bilateral   . Incisional hernia repair    . Kidney surgery      "cut me 1/2 in 2 for stones"  .  Back surgery    . I&d extremity  09/16/2012    Procedure: IRRIGATION AND DEBRIDEMENT EXTREMITY;  Surgeon: Wylene Simmer, MD;  Location: [facility];  Service: Orthopedics;  Laterality: Left;  irrigation and debridement and first Ray amputation of left foot  . Amputation  09/16/2012    Procedure: AMPUTATION RAY;  Surgeon: Wylene Simmer, MD;  Location: [facility];  Service: Orthopedics;  Laterality: Left;  first Ray amputation left foot  . Amputation  11/21/2012    Procedure: AMPUTATION RAY;  Surgeon: Wylene Simmer, MD;  Location: [place];  Service: Orthopedics;  Laterality: Left;  LEFT SECOND TOE AMPUTATION  . I&d extremity Left 03/01/2013    Procedure: IRRIGATION AND DEBRIDEMENT EXTREMITY;  Surgeon: Wylene Simmer, MD;  Location: [facility];  Service:  Orthopedics;  Laterality: Left;  IRRIGATION  AND  DEBRIDEMENT  LEFT  FOOT  . Amputation Left 12/20/2013    Procedure: AMPUTATION LEFT 5TH TRANSMETATARSAL;  Surgeon: Wylene Simmer, MD;  Location: [place];  Service: Orthopedics;  Laterality: Left;  . Achilles tendon lengthening Left 12/20/2013    Procedure: ACHILLES TENDON LENGTHENING;  Surgeon: Wylene Simmer, MD;  Location: [place];  Service: Orthopedics;  Laterality: Left;  . Hernia repair    . Coronary angioplasty with stent placement  09/2014; 10/14/2014    "?2; 1"  . Cataract extraction w/ intraocular lens  implant, bilateral Bilateral   . Transcatheter aortic valve replacement, transfemoral N/A 11/05/2014    Procedure: TRANSCATHETER AORTIC VALVE REPLACEMENT, TRANSFEMORAL;  Surgeon: [name], MD;  Location: [facility] OR; Berniece Pap 3 THV (size 29 mm, model # F048547, serial # [id number])   . Intraoperative transesophageal echocardiogram N/A 11/05/2014    Procedure: INTRAOPERATIVE TRANSESOPHAGEAL ECHOCARDIOGRAM;  Surgeon: [name], MD;  Location: [facility] OR;  Service: Open Heart Surgery;  Laterality: N/A;  . Left and right heart catheterization with coronary/graft angiogram N/A 02/03/2012    Procedure: LEFT AND RIGHT HEART CATHETERIZATION WITH Beatrix Fetters;  Surgeon: [name], MD;  Location: [facility] CATH LAB;  Service: Cardiovascular;  Laterality: N/A;  . Percutaneous stent intervention  02/03/2012    Procedure: PERCUTANEOUS STENT INTERVENTION;  Surgeon: [name], MD;  Location: [facility] CATH LAB;  Service: Cardiovascular;;  . Left heart catheterization with coronary/graft angiogram N/A 02/14/2012    Procedure: LEFT HEART CATHETERIZATION WITH Beatrix Fetters;  Surgeon: Peter M Martinique, MD;  Location: [facility] CATH LAB;  Service: Cardiovascular;  Laterality: N/A;  . Left and right heart catheterization with coronary/graft angiogram N/A 09/23/2014    Procedure: LEFT AND RIGHT HEART CATHETERIZATION WITH Beatrix Fetters;  Surgeon:  Blane Ohara, MD;  Location: [facility] CATH LAB;  Service: Cardiovascular;  Laterality: N/A;  . Percutaneous coronary stent intervention (pci-s)  10/14/2014    Procedure: PERCUTANEOUS CORONARY STENT INTERVENTION (PCI-S);  Surgeon: Blane Ohara, MD;  Location: [facility] CATH LAB;  Service: Cardiovascular;;    Social History:  reports that he quit smoking about 31 years ago. His smoking use included Cigarettes and Pipe. He has a 30 pack-year smoking history. He has quit using smokeless tobacco. His smokeless tobacco use included Chew. He reports that he does not drink alcohol or use illicit drugs.   Family History  Problem Relation Age of Onset  . Coronary artery disease    . Alcohol abuse    . Other Mother   . Other Father     Allergies  Allergen Reactions  . Losartan Other (See Comments)    Hyperkalemia > 7  . Azithromycin Itching  . Contrast Media [  Iodinated Diagnostic Agents] Rash  . Doxycycline Itching       . Keflex [Cephalexin] Itching  . Macrodantin Itching  . Minocycline Hcl Itching  . Nitrofurantoin Itching  . Zithromax [Azithromycin Dihydrate] Itching     Medications: I have reviewed patients current medications as documented in Epic Anti-infectives    Start     Dose/Rate Route Frequency Ordered Stop   04/08/16 1200  vancomycin (VANCOCIN) IVPB 750 mg/150 ml premix     750 mg 150 mL/hr over 60 Minutes Intravenous Every 12 hours 04/08/16 1035     04/07/16 2300  Ampicillin-Sulbactam (UNASYN) 3 g in sodium chloride 0.9 % 100 mL IVPB     3 g 100 mL/hr over 60 Minutes Intravenous Every 6 hours 04/07/16 2222     04/07/16 1800  vancomycin (VANCOCIN) IVPB 750 mg/150 ml premix  Status:  Discontinued     750 mg 150 mL/hr over 60 Minutes Intravenous Every 12 hours 04/07/16 0537 04/07/16 2222   04/07/16 1400  aztreonam (AZACTAM) 1 g in dextrose 5 % 50 mL IVPB  Status:  Discontinued     1 g 100 mL/hr over 30 Minutes Intravenous Every 8 hours 04/07/16 0537 04/07/16 0847   04/07/16  1000  ceFEPIme (MAXIPIME) 2 g in dextrose 5 % 50 mL IVPB  Status:  Discontinued     2 g 100 mL/hr over 30 Minutes Intravenous Every 12 hours 04/07/16 0848 04/07/16 2222   04/07/16 0430  aztreonam (AZACTAM) 2 g in dextrose 5 % 50 mL IVPB  Status:  Discontinued     2 g 100 mL/hr over 30 Minutes Intravenous  Once 04/07/16 0419 04/07/16 0420   04/07/16 0430  vancomycin (VANCOCIN) IVPB 1000 mg/200 mL premix  Status:  Discontinued     1,000 mg 200 mL/hr over 60 Minutes Intravenous  Once 04/07/16 0419 04/07/16 0420   04/07/16 0000  levofloxacin (LEVAQUIN) IVPB 750 mg     750 mg 100 mL/hr over 90 Minutes Intravenous  Once 04/06/16 2345 04/07/16 0235   04/07/16 0000  aztreonam (AZACTAM) 2 g in dextrose 5 % 50 mL IVPB     2 g 100 mL/hr over 30 Minutes Intravenous  Once 04/06/16 2345 04/07/16 0329   04/07/16 0000  vancomycin (VANCOCIN) IVPB 1000 mg/200 mL premix     1,000 mg 200 mL/hr over 60 Minutes Intravenous  Once 04/06/16 2345 04/07/16 0437         ROS:as in HPI + fever, chills, tachypnea, wheezing, otherwise remainder of 12 point Review of Systems is negative as in HPI   Blood pressure 135/66, pulse 60, temperature 97.9 F (36.6 C), temperature source Oral, resp. rate 18, height 6' (1.829 m), weight 177 lb 0.5 oz (80.3 kg), SpO2 97 %. General: Alert and awake, oriented x3 tachypneic with fairly labored breathing. HEENT: anicteric sclera,  EOMI, oropharynx clear and without exudate Cardiovascular: regular rate, normal r,  no murmur rubs or gallops heard Pulmonary: tachypneic with wheezes throughout Gastrointestinal: soft nontender, nondistended, normal bowel sounds, Musculoskeletal: + edema Skin, soft tissue: no rashes Neuro: nonfocal, strength and sensation intact   Results for orders placed or performed during the hospital encounter of 04/06/16 (from the past 48 hour(s))  Comprehensive metabolic panel     Status: Abnormal   Collection Time: 04/07/16 12:13 AM  Result Value Ref  Range   Sodium 129 (L) 135 - 145 mmol/L   Potassium 4.3 3.5 - 5.1 mmol/L   Chloride 97 (L) 101 - 111 mmol/L  CO2 21 (L) 22 - 32 mmol/L   Glucose, Bld 188 (H) 65 - 99 mg/dL   BUN 27 (H) 6 - 20 mg/dL   Creatinine, Ser 1.21 0.61 - 1.24 mg/dL   Calcium 9.4 8.9 - 10.3 mg/dL   Total Protein 5.7 (L) 6.5 - 8.1 g/dL   Albumin 3.6 3.5 - 5.0 g/dL   AST 50 (H) 15 - 41 U/L   ALT 31 17 - 63 U/L   Alkaline Phosphatase 221 (H) 38 - 126 U/L   Total Bilirubin 1.9 (H) 0.3 - 1.2 mg/dL   GFR calc non Af Amer 52 (L) >60 mL/min   GFR calc Af Amer >60 >60 mL/min    Comment: (NOTE) The eGFR has been calculated using the CKD EPI equation. This calculation has not been validated in all clinical situations. eGFR's persistently <60 mL/min signify possible Chronic Kidney Disease.    Anion gap 11 5 - 15  CBC WITH DIFFERENTIAL     Status: Abnormal   Collection Time: 04/07/16 12:13 AM  Result Value Ref Range   WBC 12.9 (H) 4.0 - 10.5 K/uL   RBC 3.42 (L) 4.22 - 5.81 MIL/uL   Hemoglobin 9.2 (L) 13.0 - 17.0 g/dL   HCT 27.7 (L) 39.0 - 52.0 %   MCV 81.0 78.0 - 100.0 fL   MCH 26.9 26.0 - 34.0 pg   MCHC 33.2 30.0 - 36.0 g/dL   RDW 16.9 (H) 11.5 - 15.5 %   Platelets 74 (L) 150 - 400 K/uL    Comment: REPEATED TO VERIFY SPECIMEN CHECKED FOR CLOTS PLATELET COUNT CONFIRMED BY SMEAR    Neutrophils Relative % 85 %   Neutro Abs 11.0 (H) 1.7 - 7.7 K/uL   Lymphocytes Relative 6 %   Lymphs Abs 0.8 0.7 - 4.0 K/uL   Monocytes Relative 8 %   Monocytes Absolute 1.1 (H) 0.1 - 1.0 K/uL   Eosinophils Relative 0 %   Eosinophils Absolute 0.0 0.0 - 0.7 K/uL   Basophils Relative 0 %   Basophils Absolute 0.0 0.0 - 0.1 K/uL   WBC Morphology DOHLE BODIES     Comment: TOXIC GRANULATION  Blood Culture (routine x 2)     Status: Abnormal (Preliminary result)   Collection Time: 04/07/16 12:13 AM  Result Value Ref Range   Specimen Description BLOOD RIGHT ANTECUBITAL    Special Requests BOTTLES DRAWN AEROBIC AND ANAEROBIC 5CC     Culture  Setup Time      GRAM POSITIVE COCCI IN CHAINS IN PAIRS IN BOTH AEROBIC AND ANAEROBIC BOTTLES Organism ID to follow CRITICAL RESULT CALLED TO, READ BACK BY AND VERIFIED WITH: C SHADE PHARMD 2027 04/07/16 A BROWNING    Culture (A)     ENTEROCOCCUS SPECIES SUSCEPTIBILITIES TO FOLLOW Performed at [facility]    Report Status PENDING   Blood Culture (routine x 2)     Status: Abnormal (Preliminary result)   Collection Time: 04/07/16 12:13 AM  Result Value Ref Range   Specimen Description BLOOD LEFT HAND    Special Requests BOTTLES DRAWN AEROBIC AND ANAEROBIC 4CC    Culture  Setup Time      GRAM POSITIVE COCCI IN CHAINS IN PAIRS IN BOTH AEROBIC AND ANAEROBIC BOTTLES CRITICAL RESULT CALLED TO, READ BACK BY AND VERIFIED WITH: Melodye Ped, PHARM D AT 1000 ON [id number] BY Rhea Bleacher Performed at [place] (A)    Report Status PENDING   Blood Culture ID Panel (  Reflexed)     Status: Abnormal   Collection Time: 04/07/16 12:13 AM  Result Value Ref Range   Enterococcus species DETECTED (A) NOT DETECTED    Comment: CRITICAL RESULT CALLED TO, READ BACK BY AND VERIFIED WITH: C SHADE PHARMD 2027 04/07/16 A BROWNING    Vancomycin resistance NOT DETECTED NOT DETECTED   Listeria monocytogenes NOT DETECTED NOT DETECTED   Staphylococcus species NOT DETECTED NOT DETECTED   Staphylococcus aureus NOT DETECTED NOT DETECTED   Methicillin resistance NOT DETECTED NOT DETECTED   Streptococcus species NOT DETECTED NOT DETECTED   Streptococcus agalactiae NOT DETECTED NOT DETECTED   Streptococcus pneumoniae NOT DETECTED NOT DETECTED   Streptococcus pyogenes NOT DETECTED NOT DETECTED   Acinetobacter baumannii NOT DETECTED NOT DETECTED   Enterobacteriaceae species NOT DETECTED NOT DETECTED   Enterobacter cloacae complex NOT DETECTED NOT DETECTED   Escherichia coli NOT DETECTED NOT DETECTED   Klebsiella oxytoca NOT DETECTED NOT DETECTED   Klebsiella  pneumoniae NOT DETECTED NOT DETECTED   Proteus species NOT DETECTED NOT DETECTED   Serratia marcescens NOT DETECTED NOT DETECTED   Carbapenem resistance NOT DETECTED NOT DETECTED   Haemophilus influenzae NOT DETECTED NOT DETECTED   Neisseria meningitidis NOT DETECTED NOT DETECTED   Pseudomonas aeruginosa NOT DETECTED NOT DETECTED   Candida albicans NOT DETECTED NOT DETECTED   Candida glabrata NOT DETECTED NOT DETECTED   Candida krusei NOT DETECTED NOT DETECTED   Candida parapsilosis NOT DETECTED NOT DETECTED   Candida tropicalis NOT DETECTED NOT DETECTED    Comment: Performed at [facility]  I-Stat CG4 Lactic Acid, ED  (not at  [facility])     Status: Abnormal   Collection Time: 04/07/16 12:27 AM  Result Value Ref Range   Lactic Acid, Venous 2.49 (HH) 0.5 - 2.0 mmol/L   Comment NOTIFIED PHYSICIAN   Urinalysis, Routine w reflex microscopic (not at [facility])     Status: Abnormal   Collection Time: 04/07/16 12:40 AM  Result Value Ref Range   Color, Urine AMBER (A) YELLOW    Comment: BIOCHEMICALS MAY BE AFFECTED BY COLOR   APPearance CLOUDY (A) CLEAR   Specific Gravity, Urine 1.017 1.005 - 1.030   pH 5.5 5.0 - 8.0   Glucose, UA NEGATIVE NEGATIVE mg/dL   Hgb urine dipstick NEGATIVE NEGATIVE   Bilirubin Urine NEGATIVE NEGATIVE   Ketones, ur NEGATIVE NEGATIVE mg/dL   Protein, ur 100 (A) NEGATIVE mg/dL   Nitrite NEGATIVE NEGATIVE   Leukocytes, UA NEGATIVE NEGATIVE  Urine culture     Status: Abnormal (Preliminary result)   Collection Time: 04/07/16 12:40 AM  Result Value Ref Range   Specimen Description URINE, CATHETERIZED    Special Requests NONE    Culture >=100,000 COLONIES/mL ENTEROCOCCUS SPECIES (A)    Report Status PENDING   Urine microscopic-add on     Status: Abnormal   Collection Time: 04/07/16 12:40 AM  Result Value Ref Range   Squamous Epithelial / LPF 0-5 (A) NONE SEEN   WBC, UA NONE SEEN 0 - 5 WBC/hpf   RBC / HPF NONE SEEN 0 - 5 RBC/hpf   Bacteria, UA NONE SEEN NONE  SEEN  Protime-INR     Status: Abnormal   Collection Time: 04/07/16  2:00 AM  Result Value Ref Range   Prothrombin Time 16.5 (H) 11.6 - 15.2 seconds   INR 1.37 0.00 - 1.49  APTT     Status: None   Collection Time: 04/07/16  2:00 AM  Result Value Ref Range  aPTT 36 24 - 37 seconds  Lactic acid, plasma     Status: Abnormal   Collection Time: 04/07/16  4:21 AM  Result Value Ref Range   Lactic Acid, Venous 2.4 (HH) 0.5 - 2.0 mmol/L    Comment: CRITICAL RESULT CALLED TO, READ BACK BY AND VERIFIED WITH: B.HOLT RN [phone] A.QUIZON   Procalcitonin - Baseline     Status: None   Collection Time: 04/07/16  4:21 AM  Result Value Ref Range   Procalcitonin 2.57 ng/mL    Comment:        Interpretation: PCT > 2 ng/mL: Systemic infection (sepsis) is likely, unless other causes are known. (NOTE)         ICU PCT Algorithm               Non ICU PCT Algorithm    ----------------------------     ------------------------------         PCT < 0.25 ng/mL                 PCT < 0.1 ng/mL     Stopping of antibiotics            Stopping of antibiotics       strongly encouraged.               strongly encouraged.    ----------------------------     ------------------------------       PCT level decrease by               PCT < 0.25 ng/mL       >= 80% from peak PCT       OR PCT 0.25 - 0.5 ng/mL          Stopping of antibiotics                                             encouraged.     Stopping of antibiotics           encouraged.    ----------------------------     ------------------------------       PCT level decrease by              PCT >= 0.25 ng/mL       < 80% from peak PCT        AND PCT >= 0.5 ng/mL            Continuing antibiotics                                               encouraged.       Continuing antibiotics            encouraged.    ----------------------------     ------------------------------     PCT level increase compared          PCT > 0.5 ng/mL         with peak PCT  AND          PCT >= 0.5 ng/mL             Escalation of antibiotics  strongly encouraged.      Escalation of antibiotics        strongly encouraged.   Troponin I (q 6hr x 3)     Status: Abnormal   Collection Time: 04/07/16  4:21 AM  Result Value Ref Range   Troponin I 1.29 (HH) <0.031 ng/mL    Comment:        POSSIBLE MYOCARDIAL ISCHEMIA. SERIAL TESTING RECOMMENDED. CRITICAL RESULT CALLED TO, READ BACK BY AND VERIFIED WITH: B.HOLT RN [phone] [id number] A.QUIZON   CBC with Differential     Status: Abnormal   Collection Time: 04/07/16  4:21 AM  Result Value Ref Range   WBC 15.4 (H) 4.0 - 10.5 K/uL   RBC 3.46 (L) 4.22 - 5.81 MIL/uL   Hemoglobin 9.4 (L) 13.0 - 17.0 g/dL   HCT 28.6 (L) 39.0 - 52.0 %   MCV 82.7 78.0 - 100.0 fL   MCH 27.2 26.0 - 34.0 pg   MCHC 32.9 30.0 - 36.0 g/dL   RDW 17.3 (H) 11.5 - 15.5 %   Platelets 60 (L) 150 - 400 K/uL    Comment: SPECIMEN CHECKED FOR CLOTS REPEATED TO VERIFY PLATELET COUNT CONFIRMED BY SMEAR    Neutrophils Relative % 85 %   Lymphocytes Relative 5 %   Monocytes Relative 10 %   Eosinophils Relative 0 %   Basophils Relative 0 %   Neutro Abs 13.1 (H) 1.7 - 7.7 K/uL   Lymphs Abs 0.8 0.7 - 4.0 K/uL   Monocytes Absolute 1.5 (H) 0.1 - 1.0 K/uL   Eosinophils Absolute 0.0 0.0 - 0.7 K/uL   Basophils Absolute 0.0 0.0 - 0.1 K/uL   RBC Morphology ELLIPTOCYTES   Comprehensive metabolic panel     Status: Abnormal   Collection Time: 04/07/16  4:21 AM  Result Value Ref Range   Sodium 130 (L) 135 - 145 mmol/L   Potassium 4.4 3.5 - 5.1 mmol/L   Chloride 102 101 - 111 mmol/L   CO2 18 (L) 22 - 32 mmol/L   Glucose, Bld 195 (H) 65 - 99 mg/dL   BUN 28 (H) 6 - 20 mg/dL   Creatinine, Ser 1.15 0.61 - 1.24 mg/dL   Calcium 8.6 (L) 8.9 - 10.3 mg/dL   Total Protein 5.1 (L) 6.5 - 8.1 g/dL   Albumin 3.2 (L) 3.5 - 5.0 g/dL   AST 73 (H) 15 - 41 U/L   ALT 37 17 - 63 U/L   Alkaline Phosphatase 220 (H) 38 - 126 U/L   Total  Bilirubin 2.2 (H) 0.3 - 1.2 mg/dL   GFR calc non Af Amer 56 (L) >60 mL/min   GFR calc Af Amer >60 >60 mL/min    Comment: (NOTE) The eGFR has been calculated using the CKD EPI equation. This calculation has not been validated in all clinical situations. eGFR's persistently <60 mL/min signify possible Chronic Kidney Disease.    Anion gap 10 5 - 15  MRSA PCR Screening     Status: None   Collection Time: 04/07/16  7:32 AM  Result Value Ref Range   MRSA by PCR NEGATIVE NEGATIVE    Comment:        The GeneXpert MRSA Assay (FDA approved for NASAL specimens only), is one component of a comprehensive MRSA colonization surveillance program. It is not intended to diagnose MRSA infection nor to guide or monitor treatment for MRSA infections.   Lactic acid, plasma     Status: None   Collection Time: 04/07/16  7:57 AM  Result Value Ref Range   Lactic Acid, Venous 1.9 0.5 - 2.0 mmol/L  Glucose, capillary     Status: Abnormal   Collection Time: 04/07/16  9:46 AM  Result Value Ref Range   Glucose-Capillary 136 (H) 65 - 99 mg/dL   Comment 1 Notify RN    Comment 2 Document in Chart   Troponin I (q 6hr x 3)     Status: Abnormal   Collection Time: 04/07/16 10:13 AM  Result Value Ref Range   Troponin I 1.27 (HH) <0.031 ng/mL    Comment:        POSSIBLE MYOCARDIAL ISCHEMIA. SERIAL TESTING RECOMMENDED. CRITICAL VALUE NOTED.  VALUE IS CONSISTENT WITH PREVIOUSLY REPORTED AND CALLED VALUE.   Lactic acid, plasma     Status: Abnormal   Collection Time: 04/07/16 10:13 AM  Result Value Ref Range   Lactic Acid, Venous 2.2 (HH) 0.5 - 2.0 mmol/L    Comment: CRITICAL RESULT CALLED TO, READ BACK BY AND VERIFIED WITH: CONRAD,J. RN AT 1106 04/07/16 MULLINS,T   Glucose, capillary     Status: Abnormal   Collection Time: 04/07/16 12:12 PM  Result Value Ref Range   Glucose-Capillary 138 (H) 65 - 99 mg/dL  Heparin level (unfractionated)     Status: None   Collection Time: 04/07/16  1:52 PM  Result  Value Ref Range   Heparin Unfractionated 0.39 0.30 - 0.70 IU/mL    Comment:        IF HEPARIN RESULTS ARE BELOW EXPECTED VALUES, AND PATIENT DOSAGE HAS BEEN CONFIRMED, SUGGEST FOLLOW UP TESTING OF ANTITHROMBIN III LEVELS.   Glucose, capillary     Status: Abnormal   Collection Time: 04/07/16  4:10 PM  Result Value Ref Range   Glucose-Capillary 120 (H) 65 - 99 mg/dL   Comment 1 Notify RN    Comment 2 Document in Chart   Troponin I (q 6hr x 3)     Status: Abnormal   Collection Time: 04/07/16  5:30 PM  Result Value Ref Range   Troponin I 1.56 (HH) <0.031 ng/mL    Comment:        POSSIBLE MYOCARDIAL ISCHEMIA. SERIAL TESTING RECOMMENDED. CRITICAL VALUE NOTED.  VALUE IS CONSISTENT WITH PREVIOUSLY REPORTED AND CALLED VALUE.   Lactic acid, plasma     Status: None   Collection Time: 04/07/16  5:30 PM  Result Value Ref Range   Lactic Acid, Venous 1.8 0.5 - 2.0 mmol/L  Comprehensive metabolic panel     Status: Abnormal   Collection Time: 04/07/16  5:30 PM  Result Value Ref Range   Sodium 132 (L) 135 - 145 mmol/L   Potassium 5.0 3.5 - 5.1 mmol/L   Chloride 105 101 - 111 mmol/L   CO2 17 (L) 22 - 32 mmol/L   Glucose, Bld 117 (H) 65 - 99 mg/dL   BUN 32 (H) 6 - 20 mg/dL   Creatinine, Ser 1.23 0.61 - 1.24 mg/dL   Calcium 8.0 (L) 8.9 - 10.3 mg/dL   Total Protein 4.8 (L) 6.5 - 8.1 g/dL   Albumin 2.9 (L) 3.5 - 5.0 g/dL   AST 56 (H) 15 - 41 U/L   ALT 36 17 - 63 U/L   Alkaline Phosphatase 192 (H) 38 - 126 U/L   Total Bilirubin 1.7 (H) 0.3 - 1.2 mg/dL   GFR calc non Af Amer 51 (L) >60 mL/min   GFR calc Af Amer 59 (L) >60 mL/min    Comment: (NOTE) The eGFR has been calculated using  the CKD EPI equation. This calculation has not been validated in all clinical situations. eGFR's persistently <60 mL/min signify possible Chronic Kidney Disease.    Anion gap 10 5 - 15  Albumin     Status: Abnormal   Collection Time: 04/07/16  5:30 PM  Result Value Ref Range   Albumin 2.9 (L) 3.5 - 5.0  g/dL  Lactate dehydrogenase     Status: None   Collection Time: 04/07/16  5:30 PM  Result Value Ref Range   LDH 186 98 - 192 U/L  Protein, total     Status: Abnormal   Collection Time: 04/07/16  5:30 PM  Result Value Ref Range   Total Protein 4.9 (L) 6.5 - 8.1 g/dL  Glucose, capillary     Status: Abnormal   Collection Time: 04/07/16  8:02 PM  Result Value Ref Range   Glucose-Capillary 108 (H) 65 - 99 mg/dL  CBC     Status: Abnormal   Collection Time: 04/08/16  3:22 AM  Result Value Ref Range   WBC 9.0 4.0 - 10.5 K/uL   RBC 3.58 (L) 4.22 - 5.81 MIL/uL   Hemoglobin 9.9 (L) 13.0 - 17.0 g/dL   HCT 29.7 (L) 39.0 - 52.0 %   MCV 83.0 78.0 - 100.0 fL   MCH 27.7 26.0 - 34.0 pg   MCHC 33.3 30.0 - 36.0 g/dL   RDW 17.4 (H) 11.5 - 15.5 %   Platelets 58 (L) 150 - 400 K/uL    Comment: SPECIMEN CHECKED FOR CLOTS REPEATED TO VERIFY PLATELET COUNT CONFIRMED BY SMEAR   Comprehensive metabolic panel     Status: Abnormal   Collection Time: 04/08/16  3:22 AM  Result Value Ref Range   Sodium 130 (L) 135 - 145 mmol/L   Potassium 4.6 3.5 - 5.1 mmol/L   Chloride 104 101 - 111 mmol/L   CO2 14 (L) 22 - 32 mmol/L   Glucose, Bld 171 (H) 65 - 99 mg/dL   BUN 38 (H) 6 - 20 mg/dL   Creatinine, Ser 1.27 (H) 0.61 - 1.24 mg/dL   Calcium 8.3 (L) 8.9 - 10.3 mg/dL   Total Protein 5.1 (L) 6.5 - 8.1 g/dL   Albumin 3.0 (L) 3.5 - 5.0 g/dL   AST 62 (H) 15 - 41 U/L   ALT 40 17 - 63 U/L   Alkaline Phosphatase 200 (H) 38 - 126 U/L   Total Bilirubin 1.7 (H) 0.3 - 1.2 mg/dL   GFR calc non Af Amer 49 (L) >60 mL/min   GFR calc Af Amer 57 (L) >60 mL/min    Comment: (NOTE) The eGFR has been calculated using the CKD EPI equation. This calculation has not been validated in all clinical situations. eGFR's persistently <60 mL/min signify possible Chronic Kidney Disease.    Anion gap 12 5 - 15  Lactic acid, plasma     Status: None   Collection Time: 04/08/16  3:22 AM  Result Value Ref Range   Lactic Acid, Venous 1.6  0.5 - 2.0 mmol/L  Bilirubin, direct     Status: Abnormal   Collection Time: 04/08/16  3:22 AM  Result Value Ref Range   Bilirubin, Direct 0.9 (H) 0.1 - 0.5 mg/dL  Troponin I     Status: Abnormal   Collection Time: 04/08/16  3:22 AM  Result Value Ref Range   Troponin I 1.19 (HH) <0.031 ng/mL    Comment:        POSSIBLE MYOCARDIAL ISCHEMIA. SERIAL TESTING RECOMMENDED. CRITICAL  VALUE NOTED.  VALUE IS CONSISTENT WITH PREVIOUSLY REPORTED AND CALLED VALUE.   Glucose, capillary     Status: Abnormal   Collection Time: 04/08/16  8:30 AM  Result Value Ref Range   Glucose-Capillary 196 (H) 65 - 99 mg/dL   Comment 1 Notify RN    Comment 2 Document in Chart   Albumin, pleural or peritoneal fluid     Status: None   Collection Time: 04/08/16 10:11 AM  Result Value Ref Range   Albumin, Fluid <1.0 g/dL    Comment: (NOTE) No normal range established for this test Results should be evaluated in conjunction with serum values Performed at [facility]    Fluid Type-FALB ASCITIES   Lactate dehydrogenase (CSF, pleural or peritoneal fluid)     Status: Abnormal   Collection Time: 04/08/16 10:11 AM  Result Value Ref Range   LD, Fluid 46 (H) 3 - 23 U/L    Comment: (NOTE) Results should be evaluated in conjunction with serum values Performed at [facility]    Fluid Type-FLDH ASCITIES   Protein, pleural or peritoneal fluid     Status: None   Collection Time: 04/08/16 10:11 AM  Result Value Ref Range   Total protein, fluid <3.0 g/dL    Comment: (NOTE) No normal range established for this test Results should be evaluated in conjunction with serum values Performed at [facility]    Fluid Type-FTP ASCITIES   Body fluid cell count with differential     Status: Abnormal   Collection Time: 04/08/16 10:11 AM  Result Value Ref Range   Fluid Type-FCT ASCITIES    Color, Fluid STRAW (A) YELLOW   Appearance, Fluid HAZY (A) CLEAR   WBC, Fluid 226 0 - 1000 cu mm   Neutrophil  Count, Fluid 72 (H) 0 - 25 %   Lymphs, Fluid 9 %   Monocyte-Macrophage-Serous Fluid 19 (L) 50 - 90 %   Eos, Fluid 0 %   Other Cells, Fluid RARE %    Comment: OTHER CELLS IDENTIFIED AS MESOTHELIAL CELLS  Glucose, pleural or peritoneal fluid     Status: None   Collection Time: 04/08/16 10:11 AM  Result Value Ref Range   Glucose, Fluid 188 mg/dL    Comment: (NOTE) No normal range established for this test Results should be evaluated in conjunction with serum values Performed at [facility]    Fluid Type-FGLU ASCITIES   Gram stain     Status: None   Collection Time: 04/08/16 10:11 AM  Result Value Ref Range   Specimen Description FLUID ASCITIC    Special Requests NONE    Gram Stain      MODERATE WBC PRESENT,BOTH PMN AND MONONUCLEAR NO ORGANISMS SEEN Performed at [facility]    Report Status 04/08/2016 FINAL   Glucose, capillary     Status: Abnormal   Collection Time: 04/08/16 11:47 AM  Result Value Ref Range   Glucose-Capillary 246 (H) 65 - 99 mg/dL   Comment 1 Notify RN    Comment 2 Document in Chart    @BRIEFLABTABLE (sdes,specrequest,cult,reptstatus)   ) Recent Results (from the past 720 hour(s))  Culture, blood (routine x 2) Call MD if unable to obtain prior to antibiotics being given     Status: None   Collection Time: 03/12/16  7:55 PM  Result Value Ref Range Status   Specimen Description BLOOD BLOOD RIGHT FOREARM  Final   Special Requests BOTTLES DRAWN AEROBIC AND ANAEROBIC [facility] EACH  Final  Culture   Final    NO GROWTH 5 DAYS Performed at [facility]    Report Status 03/18/2016 FINAL  Final  Respiratory virus panel     Status: None   Collection Time: 03/12/16  9:52 PM  Result Value Ref Range Status   Respiratory Syncytial Virus A Negative Negative Final   Respiratory Syncytial Virus B Negative Negative Final   Influenza A Negative Negative Final   Influenza B Negative Negative Final   Parainfluenza 1 Negative Negative Final    Parainfluenza 2 Negative Negative Final   Parainfluenza 3 Negative Negative Final   Metapneumovirus Negative Negative Final   Rhinovirus Negative Negative Final   Adenovirus Negative Negative Final    Comment: (NOTE) Performed At: [facility] [place], [place] [id number] Lindon Romp MD [id number]   Culture, blood (routine x 2) Call MD if unable to obtain prior to antibiotics being given     Status: None   Collection Time: 03/13/16  5:25 AM  Result Value Ref Range Status   Specimen Description BLOOD LEFT ANTECUBITAL  Final   Special Requests IN PEDIATRIC BOTTLE 3CC  Final   Culture   Final    NO GROWTH 5 DAYS Performed at [facility]    Report Status 03/18/2016 FINAL  Final  Culture, sputum-assessment     Status: None   Collection Time: 03/13/16  8:16 PM  Result Value Ref Range Status   Specimen Description SPUTUM  Final   Special Requests NONE  Final   Sputum evaluation THIS SPECIMEN IS ACCEPTABLE FOR SPUTUM CULTURE  Final   Report Status 03/13/2016 FINAL  Final  Culture, respiratory (NON-Expectorated)     Status: None   Collection Time: 03/13/16  8:16 PM  Result Value Ref Range Status   Specimen Description SPU  Final   Special Requests NONE  Final   Gram Stain   Final    ABUNDANT WBC PRESENT, PREDOMINANTLY PMN RARE SQUAMOUS EPITHELIAL CELLS PRESENT FEW GRAM POSITIVE COCCI IN PAIRS IN CLUSTERS THIS SPECIMEN IS ACCEPTABLE FOR SPUTUM CULTURE Performed at [facility]    Culture   Final    NORMAL OROPHARYNGEAL FLORA Performed at [facility]    Report Status 03/16/2016 FINAL  Final  Blood Culture (routine x 2)     Status: Abnormal (Preliminary result)   Collection Time: 04/07/16 12:13 AM  Result Value Ref Range Status   Specimen Description BLOOD RIGHT ANTECUBITAL  Final   Special Requests BOTTLES DRAWN AEROBIC AND ANAEROBIC 5CC  Final   Culture  Setup Time   Final    GRAM POSITIVE COCCI IN CHAINS IN PAIRS IN BOTH  AEROBIC AND ANAEROBIC BOTTLES Organism ID to follow CRITICAL RESULT CALLED TO, READ BACK BY AND VERIFIED WITH: C SHADE PHARMD 2027 04/07/16 A BROWNING    Culture (A)  Final    ENTEROCOCCUS SPECIES SUSCEPTIBILITIES TO FOLLOW Performed at [facility]    Report Status PENDING  Incomplete  Blood Culture (routine x 2)     Status: Abnormal (Preliminary result)   Collection Time: 04/07/16 12:13 AM  Result Value Ref Range Status   Specimen Description BLOOD LEFT HAND  Final   Special Requests BOTTLES DRAWN AEROBIC AND ANAEROBIC 4CC  Final   Culture  Setup Time   Final    GRAM POSITIVE COCCI IN CHAINS IN PAIRS IN BOTH AEROBIC AND ANAEROBIC BOTTLES CRITICAL RESULT CALLED TO, READ BACK BY AND VERIFIED WITH: Melodye Ped, PHARM D AT  [place] Performed at [place] (A)  Final   Report Status PENDING  Incomplete  Blood Culture ID Panel (Reflexed)     Status: Abnormal   Collection Time: 04/07/16 12:13 AM  Result Value Ref Range Status   Enterococcus species DETECTED (A) NOT DETECTED Final    Comment: CRITICAL RESULT CALLED TO, READ BACK BY AND VERIFIED WITH: C SHADE PHARMD 2027 04/07/16 A BROWNING    Vancomycin resistance NOT DETECTED NOT DETECTED Final   Listeria monocytogenes NOT DETECTED NOT DETECTED Final   Staphylococcus species NOT DETECTED NOT DETECTED Final   Staphylococcus aureus NOT DETECTED NOT DETECTED Final   Methicillin resistance NOT DETECTED NOT DETECTED Final   Streptococcus species NOT DETECTED NOT DETECTED Final   Streptococcus agalactiae NOT DETECTED NOT DETECTED Final   Streptococcus pneumoniae NOT DETECTED NOT DETECTED Final   Streptococcus pyogenes NOT DETECTED NOT DETECTED Final   Acinetobacter baumannii NOT DETECTED NOT DETECTED Final   Enterobacteriaceae species NOT DETECTED NOT DETECTED Final   Enterobacter cloacae complex NOT DETECTED NOT DETECTED Final   Escherichia coli NOT DETECTED NOT DETECTED  Final   Klebsiella oxytoca NOT DETECTED NOT DETECTED Final   Klebsiella pneumoniae NOT DETECTED NOT DETECTED Final   Proteus species NOT DETECTED NOT DETECTED Final   Serratia marcescens NOT DETECTED NOT DETECTED Final   Carbapenem resistance NOT DETECTED NOT DETECTED Final   Haemophilus influenzae NOT DETECTED NOT DETECTED Final   Neisseria meningitidis NOT DETECTED NOT DETECTED Final   Pseudomonas aeruginosa NOT DETECTED NOT DETECTED Final   Candida albicans NOT DETECTED NOT DETECTED Final   Candida glabrata NOT DETECTED NOT DETECTED Final   Candida krusei NOT DETECTED NOT DETECTED Final   Candida parapsilosis NOT DETECTED NOT DETECTED Final   Candida tropicalis NOT DETECTED NOT DETECTED Final    Comment: Performed at [facility]  Urine culture     Status: Abnormal (Preliminary result)   Collection Time: 04/07/16 12:40 AM  Result Value Ref Range Status   Specimen Description URINE, CATHETERIZED  Final   Special Requests NONE  Final   Culture >=100,000 COLONIES/mL ENTEROCOCCUS SPECIES (A)  Final   Report Status PENDING  Incomplete  MRSA PCR Screening     Status: None   Collection Time: 04/07/16  7:32 AM  Result Value Ref Range Status   MRSA by PCR NEGATIVE NEGATIVE Final    Comment:        The GeneXpert MRSA Assay (FDA approved for NASAL specimens only), is one component of a comprehensive MRSA colonization surveillance program. It is not intended to diagnose MRSA infection nor to guide or monitor treatment for MRSA infections.   Gram stain     Status: None   Collection Time: 04/08/16 10:11 AM  Result Value Ref Range Status   Specimen Description FLUID ASCITIC  Final   Special Requests NONE  Final   Gram Stain   Final    MODERATE WBC PRESENT,BOTH PMN AND MONONUCLEAR NO ORGANISMS SEEN Performed at [facility]    Report Status 04/08/2016 FINAL  Final     Impression/Recommendation  Principal Problem:   SIRS (systemic inflammatory response syndrome)  (HCC) Active Problems:   Essential hypertension   Mantle cell lymphoma (HCC)   S/P TAVR (transcatheter aortic valve replacement)   Type 2 diabetes mellitus with vascular disease (HCC)   Pressure ulcer   Ascites   Pyrexia   Sepsis (HCC)   Enterococcal sepsis (  [place])   Paroxysmal atrial fibrillation ([place])   Wheezing   [name] is a 82 y.o. male with mantle cell lymphoma,  Prosthetic heart valve and Enterococcal bacteremia, and septic shock   #1 Enterococcal bacteremia with prosthetic heart valve;  Vancomycin and unasyn fine for now  If this is AMP sensitive, will change to AMP + GENT vs AMP + high dose ceftriaxone ( I suspect the dual beta lactam therapy will be more tolerable regimen in terms of his kidney function  Followup the TTE and consider TEE  If he does not have endocarditis then we will not be obliged to expose him to protracted IV aminoglycoside therapy and or vancomycin if AMP R  There are also other options such as cubicin  I have discussed with Dr. 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 Cardiac wheezing after IVF resuscitation. He denies asthma hx. While he is DNR he looks very sick and I am worried about his resp status. Will defer to CCM, primary team and Cardiology re management.  04/08/2016, 3:36 PM   Thank you so much for this interesting consult  [place] [facility] [place] [phone] (pager) [phone] (office) 04/08/2016, 3:36 PM  Rhina [place] 04/08/2016, 3:36 PM

## 2016-04-08 NOTE — Procedures (Signed)
Ultrasound-guided diagnostic and therapeutic paracentesis performed yielding 1.2  liters of slightly hazy, yellow  fluid. No immediate complications. The fluid was sent to the lab for preordered studies.

## 2016-04-08 NOTE — Consult Note (Addendum)
Cardiology Consult    Patient ID: [name] MRN: [id number], DOB/AGE: 80-Mar-1928   Admit date: 04/06/2016 Date of Consult: 04/08/2016  Primary Physician: Donnajean Lopes, MD Primary Cardiologist: Dr. Burt Knack Requesting Provider: Dr. Marylou Mccoy  Patient Profile    82 yo male with PMH of HTN/HLD/Asthma/BPH/Anemia/Mantle Cell Lymphoma/CAD s/p CABG 3vd with most recent PCI 10/2014 patent DES to the VG>>PDA/Carotid stenosis/PVD/DM/COPD/s/p TAVR 2015/DVT who presented to the [facility] ED with fever/chills and admitted for SIRS on 04/07/2016.   Past Medical History   Past Medical History  Diagnosis Date  . CAD (coronary artery disease)     a. s/p CABG 1995;  b. NSTEMI & subsequent BMS to SVG->right PDA 02/03/12.;  c. Cath 02/14/2012 3vd with 4/4 patent grafts and patent stent in vg->pda;  d. 10/2014 PCI/DES to the VG->PDA.  . Carotid stenosis     a. Carotid dopplers [id number]: RICA [id number], LICA [id number];  b. dopplers 3/14:  [id number] RICA, [id number] LICA  . HTN (hypertension)   . HLD (hyperlipidemia)   . Asthmatic bronchitis   . BPH (benign prostatic hypertrophy)   . Herpes zoster   . Anemia   . Thrombocytopenia ([place])     a. secondary to splenomegaly related to lymphoma with suspicion of bone marrow involvement. (Plavix had to be stopped due to this)  . Aortic stenosis, severe     a. ECHO 07/22/14 Severe AS. Peak and mean gradients of 57 mmHg and 42 mmHg, respectively. Calculated AVA is 0.8-0.9 cm2. Trivial AR. Valve area (VTI): 0.84 cm^2. Valve area (Vmax): 0.86 cm^2.. Aortic root dimension: 42 mm (ED) and aortic root mildly dilated;  b. 10/2014 S/P TAVR w/ Edwards Sapien 3 THV 75mm;  c. 10/2014 Echo: EF 50%, nl fxning AoV, mean grad of 9, peak of 16.  Marland Kitchen Splenomegaly   . COPD (chronic obstructive pulmonary disease) ([place])   . Peripheral vascular disease ([place])     a. s/p L ray amp 10/13;  b. s/p L 2nd toe amp 12/13  . Pulmonary nodule     a. 77mm RUL calcified pulm nodule noted on CT staging for lymphoma  - stable 10/2012.  . Leg DVT (deep venous thromboembolism), chronic ([place])     a. LE dopplers 5/13, 10/13 and 1/14: chronic DVT involving right mid femoral vein, left mid femoral vein, and left popliteal vein.  . Osteomyelitis ([place])     a. L first foot ray amputation 09/2012  . Heart murmur   . Anginal pain ([place])   . Myocardial infarction [facility]) ?1995  . Pneumonia ?2014  . DM2 (diabetes mellitus, type 2) ([place])   . DJD (degenerative joint disease)   . 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 Stage III s/p bendamustine, Rituxan therapy  . 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 29 mm Edwards Sapien 3 transcatheter heart valve placed via open right transfemoral approach    Past Surgical History  Procedure Laterality Date  . Coronary artery bypass graft  1995  . Shoulder arthroscopy w/ rotator cuff repair Right   . Carpal tunnel release Bilateral   . Incisional hernia repair    . Kidney surgery      "cut me 1/2 in 2 for stones"  . Back surgery    . I&d extremity  09/16/2012    Procedure: IRRIGATION AND DEBRIDEMENT EXTREMITY;  Surgeon: Wylene Simmer, MD;  Location: [facility];  Service: Orthopedics;  Laterality: Left;  irrigation and debridement  and first Ray amputation of left foot  . Amputation  09/16/2012    Procedure: AMPUTATION RAY;  Surgeon: Wylene Simmer, MD;  Location: [facility];  Service: Orthopedics;  Laterality: Left;  first Ray amputation left foot  . Amputation  11/21/2012    Procedure: AMPUTATION RAY;  Surgeon: Wylene Simmer, MD;  Location: [place];  Service: Orthopedics;  Laterality: Left;  LEFT SECOND TOE AMPUTATION  . I&d extremity Left 03/01/2013    Procedure: IRRIGATION AND DEBRIDEMENT EXTREMITY;  Surgeon: Wylene Simmer, MD;  Location: [facility];  Service: Orthopedics;  Laterality: Left;  IRRIGATION  AND  DEBRIDEMENT  LEFT  FOOT  . Amputation Left 12/20/2013    Procedure: AMPUTATION LEFT 5TH TRANSMETATARSAL;  Surgeon: Wylene Simmer, MD;   Location: [place];  Service: Orthopedics;  Laterality: Left;  . Achilles tendon lengthening Left 12/20/2013    Procedure: ACHILLES TENDON LENGTHENING;  Surgeon: Wylene Simmer, MD;  Location: [place];  Service: Orthopedics;  Laterality: Left;  . Hernia repair    . Coronary angioplasty with stent placement  09/2014; 10/14/2014    "?2; 1"  . Cataract extraction w/ intraocular lens  implant, bilateral Bilateral   . Transcatheter aortic valve replacement, transfemoral N/A 11/05/2014    Procedure: TRANSCATHETER AORTIC VALVE REPLACEMENT, TRANSFEMORAL;  Surgeon: [name], MD;  Location: [facility] OR; Berniece Pap 3 THV (size 29 mm, model # [id number], serial # [id number])   . Intraoperative transesophageal echocardiogram N/A 11/05/2014    Procedure: INTRAOPERATIVE TRANSESOPHAGEAL ECHOCARDIOGRAM;  Surgeon: [name], MD;  Location: [facility] OR;  Service: Open Heart Surgery;  Laterality: N/A;  . Left and right heart catheterization with coronary/graft angiogram N/A 02/03/2012    Procedure: LEFT AND RIGHT HEART CATHETERIZATION WITH Beatrix Fetters;  Surgeon: [name], MD;  Location: [facility] CATH LAB;  Service: Cardiovascular;  Laterality: N/A;  . Percutaneous stent intervention  02/03/2012    Procedure: PERCUTANEOUS STENT INTERVENTION;  Surgeon: [name], MD;  Location: [facility] CATH LAB;  Service: Cardiovascular;;  . Left heart catheterization with coronary/graft angiogram N/A 02/14/2012    Procedure: LEFT HEART CATHETERIZATION WITH Beatrix Fetters;  Surgeon: Peter M Martinique, MD;  Location: [facility] CATH LAB;  Service: Cardiovascular;  Laterality: N/A;  . Left and right heart catheterization with coronary/graft angiogram N/A 09/23/2014    Procedure: LEFT AND RIGHT HEART CATHETERIZATION WITH Beatrix Fetters;  Surgeon: Blane Ohara, MD;  Location: [facility] CATH LAB;  Service: Cardiovascular;  Laterality: N/A;  . Percutaneous coronary stent intervention (pci-s)  10/14/2014    Procedure: PERCUTANEOUS CORONARY  STENT INTERVENTION (PCI-S);  Surgeon: Blane Ohara, MD;  Location: [facility] C[facility] LAB;  Service: Cardiovascular;;     Allergies  Allergies  Allergen Reactions  . Losartan Other (See Comments)    Hyperkalemia > 7  . Azithromycin Itching  . Contrast Media [Iodinated Diagnostic Agents] Rash  . Doxycycline Itching       . Keflex [Cephalexin] Itching  . Macrodantin Itching  . Minocycline Hcl Itching  . Nitrofurantoin Itching  . Zithromax [Azithromycin Dihydrate] Itching    History of Present Illness    [name] is an 82 year old male of Dr. Antionette Char with an extensive past medical history of HTN/HLD/Asthma/BPH/Anemia/Mantle Cell Lymphoma/CAD s/p CABG 3vd with most recent PCI 10/2014 patent DES to the VG>>PDA/Carotid stenosis/PVD/DM/COPD/s/p TAVR 2015/DVT. His last visit to the office was 11/21/2015, and post TAVR echo at that time showed normal transcatheter heart valve function, with no paravalvular aortic insufficiency noted. He was noted to have no anginal symptoms and was instructed  to continue his aspirin and Plavix. His blood pressure was well controlled on his current medical therapy and was continued on statin therapy as well, and follow-up was scheduled for 6 months. He was recently admitted back in April via internal medicine for community-acquired pneumonia, and discharged with recommended follow-up x-ray within 3-4 weeks.   On 04/06/2016 he presented to the [facility] ED via EMS after reporting a fever of 101, along with increased confusion, difficulty urinating, abdominal pain associated with nausea. His workup revealed elevation of his white count and elevation of lactic acid he was started on Levaquin and IV fluids, and admitted with diagnosis of SIRS to internal medicine. Source of infection was unclear at that time. On admission his cardiac enzymes were cycled with an initial reading of 1.27>>1.56>>1.19. He was started on a heparin drip and given one dose of aspirin and Plavix, and  EKG at that time showed what appeared to be new onset atrial fibrillation, with a rate of 114. He was placed on metoprolol 12.5 mg twice a day, and transferred to stepdown unit. On 04/07/2016 CT of the abdomen and pelvis showed no identified infectious process, that showed 2 indeterminate low attenuation foci in the right lobe of the liver, along with splenomegaly and ascites, and bilateral pleural effusions that seemed to be slightly improved from previous admission.  At this time he also developed hypotension, requiring the use of pressors for support. Oncology was consulted regarding the results of the CT abdomen and pelvis. On 04/07/2016 heparin drip was discontinued due to his thrombocytopenic state aspirin and Plavix were continued. Critical care was also consulted in relation to his hemodynamic instability, with plans to wean off phenylephrine as able. He was able to be weaned off of pressors last evening, and started back on low-dose Lopressor, with continuation of Plavix/aspirin.  This morning he remains in A. fib, but rate is controlled in the 80s with frequent PVCs and episodes of bigeminy. He was recently taken down for an ultrasound/paracentesis of the abdomen. Most recent vital signs include a BP 137/90, heart rate 83, and currently afebrile. Morning labs showed a creatinine of 1.27 along with a troponin of 1.19. Lactic acid has decreased to 1.6. Hemoglobin is improved at 9.9, but remains thrombocytopenic with a platelet count of only 58.   Cardiology has been consult and in relation to new diagnosis of atrial fibrillation along with issue of anticoagulation.  Inpatient Medications    . ampicillin-sulbactam (UNASYN) IV  3 g Intravenous Q6H  . aspirin EC  81 mg Oral Daily  . clopidogrel  75 mg Oral Daily  . feeding supplement (ENSURE ENLIVE)  237 mL Oral TID BM  . gabapentin  300 mg Oral BID  . guaiFENesin  600 mg Oral BID  . hydrocortisone sod succinate (SOLU-CORTEF) inj  50 mg  Intravenous Q6H  . insulin aspart  0-9 Units Subcutaneous TID WC  . ipratropium  0.5 mg Nebulization Q8H  . isosorbide mononitrate  30 mg Oral Daily  . levalbuterol  1.25 mg Nebulization Q8H  . metoprolol tartrate  12.5 mg Oral BID  . pravastatin  40 mg Oral Daily  . vitamin B-12  1,000 mcg Oral Daily   . phenylephrine (NEO-SYNEPHRINE) Adult infusion Stopped (04/07/16 1800)   Family History    Family History  Problem Relation Age of Onset  . Coronary artery disease    . Alcohol abuse    . Other Mother   . Other Father     Social  History    Social History   Social History  . Marital Status: Married    Spouse Name: N/A  . Number of Children: N/A  . Years of Education: N/A   Occupational History  . retired [employer]   Social History Main Topics  . Smoking status: Former Smoker -- 1.00 packs/day for 30 years    Types: Cigarettes, Pipe    Quit date: 03/29/1985  . Smokeless tobacco: Former Systems developer    Types: Chew     Comment: "chewed for ~ 1 yr after I quit smoking"  . Alcohol Use: No  . Drug Use: No  . Sexual Activity: Not Currently   Other Topics Concern  . Not on file   Social History Narrative     Review of Systems    General:  + chills, + fever, night sweats or weight changes.  Cardiovascular:  No chest pain, + dyspnea on exertion, edema, orthopnea, palpitations, paroxysmal nocturnal dyspnea. Dermatological: No rash, lesions/masses Respiratory: No cough, + dyspnea Urologic: No hematuria, dysuria Abdominal:   No nausea, vomiting, diarrhea, bright red blood per rectum, melena, or hematemesis Neurologic:  No visual changes, + wkns, changes in mental status. All other systems reviewed and are otherwise negative except as noted above.  Physical Exam    Blood pressure 137/90, pulse 36, temperature 97.3 F (36.3 C), temperature source Oral, resp. rate 24, height 6' (1.829 m), weight 177 lb 0.5 oz (80.3 kg), SpO2 99 %.  General: Pleasant but frail older  male. Psych: Normal affect. Neuro: Alert and oriented X 3. Moves all extremities spontaneously. HEENT: Normal  Neck: Supple without bruits, + JVD. Lungs:  Resp regular but labored, Bilateral wheezing. Heart: RRR no s3, s4, or murmurs. Abdomen: Soft, tender, non-distended, BS + x 4.  Extremities: No clubbing, cyanosis or edema. PT/Radials 2+ and equal bilaterally. Pedal pulses +1 bilaterally, with partial amputation to left foot.  Labs    Troponin [facility] of Care Test)  Recent Labs  04/07/16 0421 04/07/16 1013 04/07/16 1730 04/08/16 0322  TROPONINI 1.29* 1.27* 1.56* 1.19*   Lab Results  Component Value Date   WBC 9.0 04/08/2016   HGB 9.9* 04/08/2016   HCT 29.7* 04/08/2016   MCV 83.0 04/08/2016   PLT 58* 04/08/2016    Recent Labs Lab 04/08/16 0322  NA 130*  K 4.6  CL 104  CO2 14*  BUN 38*  CREATININE 1.27*  CALCIUM 8.3*  PROT 5.1*  BILITOT 1.7*  ALKPHOS 200*  ALT 40  AST 62*  GLUCOSE 171*   Lab Results  Component Value Date   CHOL 111 02/02/2012   HDL 24* 02/02/2012   LDLCALC 72 02/02/2012   TRIG 76 02/02/2012     Radiology Studies    Ct Abdomen Pelvis Wo Contrast  04/07/2016  CLINICAL DATA:  Abdominal pain.  Mild diffuse abdominal tenderness. EXAM: CT CHEST, ABDOMEN AND PELVIS WITHOUT CONTRAST TECHNIQUE: Multidetector CT imaging of the chest, abdomen and pelvis was performed following the standard protocol without IV contrast. COMPARISON:  03/12/2016 FINDINGS: CT CHEST FINDINGS Mediastinum/Lymph Nodes: There is moderate cardiac enlargement. Calcification of the mitral valve noted. There is a prosthetic aortic valve. Previous median sternotomy and CABG procedure noted. The trachea appears patent and is midline. Normal appearance of the esophagus. No axillary or supraclavicular adenopathy. No mediastinal or hilar adenopathy. Lungs/Pleura: Small pleural effusions are identified bilaterally. There are bilateral lower lobe pulmonary opacities which appear stable to  slightly improved from previous exam, image 77 of series  4. Musculoskeletal: No chest wall mass or suspicious bone lesions identified. CT ABDOMEN PELVIS FINDINGS Hepatobiliary: Hypertrophy of the lateral segment of left lobe of liver and caudate lobe of liver identified. Findings are suggestive of cirrhosis. Ill defined low-attenuation lesion within the right lobe of liver measures 1.8 cm, image 59 of series 2. This appears new from 10/08/2014 and is indeterminate. Posterior right lobe of liver low-attenuation structure measures 1.5 cm, image 65 of series 2. Also new from 10/08/2014. Gallbladder is normal. No biliary dilatation. Pancreas: No mass or inflammatory process identified on this un-enhanced exam. Spleen: The spleen is enlarged measuring 17 cm in cranial caudal dimension. Adrenals/Urinary Tract: Normal adrenal glands. Unremarkable appearance of the right kidney peer the lower pole calculus measures 6 mm, image 77 of series 2. Mild diffuse bladder wall thickening. Small diverticula arises from the right lateral wall of the bladder, image 115 of series 2 Stomach/Bowel: The stomach is normal. The small bowel loops have a normal course and caliber. There is no evidence for bowel obstruction. No pathologic dilatation of the colon. Multiple distal colonic diverticula noted. Vascular/Lymphatic: Calcified atherosclerotic disease involves the abdominal aorta. No aneurysm. No adenopathy identified within the abdomen or pelvis. Reproductive: Prostate gland and seminal vesicles are unremarkable. Other: There is moderate ascites identified within the abdomen and pelvis. No focal fluid collections identified. Musculoskeletal: Spondylosis is identified within the lumbar spine. This is most advanced at the L5-S1 level. No aggressive lytic or sclerotic bone lesions noted. IMPRESSION: 1. No findings identified to explain patient's sepsis. 2. Morphologic features a liver are concerning for cirrhosis. 3. There are 2  indeterminate low-attenuation foci within the right lobe of liver, new from 10/08/2014. More definitive characterization with contrast enhanced MRI of the liver is suggested as patient's clinical condition tolerates. 4. Splenomegaly 5. Ascites 6. Bilateral pleural effusions with mild bilateral lower lobe pneumonitis. Aeration to the lower lobes appear slightly improved from 03/12/2016. 7. Aortic atherosclerosis 8. Lumbar spondylosis. Electronically Signed   By: Kerby Moors M.D.   On: 04/07/2016 11:58   Ct Head Wo Contrast  04/07/2016  CLINICAL DATA:  Fever, confusion, elevated white cell count. Acute encephalopathy. EXAM: CT HEAD WITHOUT CONTRAST TECHNIQUE: Contiguous axial images were obtained from the base of the skull through the vertex without intravenous contrast. COMPARISON:  07/21/2014 FINDINGS: Examination is somewhat limited by motion artifact. Diffuse cerebral atrophy. Mild ventricular dilatation consistent with central atrophy. Low-attenuation changes in the deep white matter consistent small vessel ischemia. No mass effect or midline shift. No abnormal extra-axial fluid collections. Gray-white matter junctions are distinct. Basal cisterns are not effaced. No evidence of acute intracranial hemorrhage. No depressed skull fractures. Visualized paranasal sinuses and mastoid air cells are not opacified. IMPRESSION: No acute intracranial abnormalities. Mild chronic atrophy and small vessel ischemic changes. Electronically Signed   By: Lucienne Capers M.D.   On: 04/07/2016 03:14   Ct Chest Wo Contrast  04/07/2016  CLINICAL DATA:  Abdominal pain.  Mild diffuse abdominal tenderness. EXAM: CT CHEST, ABDOMEN AND PELVIS WITHOUT CONTRAST TECHNIQUE: Multidetector CT imaging of the chest, abdomen and pelvis was performed following the standard protocol without IV contrast. COMPARISON:  03/12/2016 FINDINGS: CT CHEST FINDINGS Mediastinum/Lymph Nodes: There is moderate cardiac enlargement. Calcification of the  mitral valve noted. There is a prosthetic aortic valve. Previous median sternotomy and CABG procedure noted. The trachea appears patent and is midline. Normal appearance of the esophagus. No axillary or supraclavicular adenopathy. No mediastinal or hilar adenopathy. Lungs/Pleura: Small pleural effusions  are identified bilaterally. There are bilateral lower lobe pulmonary opacities which appear stable to slightly improved from previous exam, image 77 of series 4. Musculoskeletal: No chest wall mass or suspicious bone lesions identified. CT ABDOMEN PELVIS FINDINGS Hepatobiliary: Hypertrophy of the lateral segment of left lobe of liver and caudate lobe of liver identified. Findings are suggestive of cirrhosis. Ill defined low-attenuation lesion within the right lobe of liver measures 1.8 cm, image 59 of series 2. This appears new from 10/08/2014 and is indeterminate. Posterior right lobe of liver low-attenuation structure measures 1.5 cm, image 65 of series 2. Also new from 10/08/2014. Gallbladder is normal. No biliary dilatation. Pancreas: No mass or inflammatory process identified on this un-enhanced exam. Spleen: The spleen is enlarged measuring 17 cm in cranial caudal dimension. Adrenals/Urinary Tract: Normal adrenal glands. Unremarkable appearance of the right kidney peer the lower pole calculus measures 6 mm, image 77 of series 2. Mild diffuse bladder wall thickening. Small diverticula arises from the right lateral wall of the bladder, image 115 of series 2 Stomach/Bowel: The stomach is normal. The small bowel loops have a normal course and caliber. There is no evidence for bowel obstruction. No pathologic dilatation of the colon. Multiple distal colonic diverticula noted. Vascular/Lymphatic: Calcified atherosclerotic disease involves the abdominal aorta. No aneurysm. No adenopathy identified within the abdomen or pelvis. Reproductive: Prostate gland and seminal vesicles are unremarkable. Other: There is moderate  ascites identified within the abdomen and pelvis. No focal fluid collections identified. Musculoskeletal: Spondylosis is identified within the lumbar spine. This is most advanced at the L5-S1 level. No aggressive lytic or sclerotic bone lesions noted. IMPRESSION: 1. No findings identified to explain patient's sepsis. 2. Morphologic features a liver are concerning for cirrhosis. 3. There are 2 indeterminate low-attenuation foci within the right lobe of liver, new from 10/08/2014. More definitive characterization with contrast enhanced MRI of the liver is suggested as patient's clinical condition tolerates. 4. Splenomegaly 5. Ascites 6. Bilateral pleural effusions with mild bilateral lower lobe pneumonitis. Aeration to the lower lobes appear slightly improved from 03/12/2016. 7. Aortic atherosclerosis 8. Lumbar spondylosis. Electronically Signed   By: Kerby Moors M.D.   On: 04/07/2016 11:58   Dg Chest Port 1 View  04/08/2016  CLINICAL DATA:  Wheezing, cough. EXAM: PORTABLE CHEST 1 VIEW COMPARISON:  Apr 06, 2016. FINDINGS: Stable cardiomegaly. Status post Coronary artery bypass graft. No pneumothorax or pleural effusion is noted. Stable interstitial densities are noted throughout both lungs most consistent with scarring. Bony thorax appears intact. IMPRESSION: Stable interstitial densities are noted throughout both lungs most consistent with scarring. No significant changes noted compared to prior exam. Electronically Signed   By: Marijo Conception, M.D.   On: 04/08/2016 09:19    ECG & Cardiac Imaging    EKG: A-Fib Rate-114 with ST/T wave changes similar to previous EKGs  Echo:11/21/2015  Study Conclusions  - Left ventricle: The cavity size was mildly dilated. Wall  thickness was normal. Systolic function was normal. The estimated  ejection fraction was in the range of 50% to 55%. There is  hypokinesis of the inferolateral myocardium. Doppler parameters  are consistent with abnormal left  ventricular relaxation (grade 1  diastolic dysfunction). Doppler parameters are consistent with  high ventricular filling pressure. - Aortic valve: A bioprosthesis was present. - Mitral valve: Severely calcified annulus. Mildly thickened  leaflets . There was mild regurgitation. - Left atrium: The atrium was moderately to severely dilated. - Right ventricle: The cavity size was moderately dilated. -  Right atrium: The atrium was moderately dilated. - Tricuspid valve: There was moderate regurgitation. - Pulmonary arteries: Systolic pressure was mildly increased.  Impressions:  - Inferior lateral hypokinesis with overall low normal LV function;  grade 1 diastolic dysfunction with elevated LV filling pressure;  s/p TAVR with mean gradient of 12 mmHg and no AI; biatrial  enlargement; moderate RVE; mild MR and TR; mildly elevated  pulmonary pressure; compared to 12/31/14, AVR continues to  function normally.  LHC 10/14/2014  Lesion Data: Vessel: SVG-PDA Percent stenosis (pre): 95 TIMI-flow (pre): 3 Stent: 3.0x20 mm Promus DES Percent stenosis (post): 20 TIMI-flow (post): 3  Conclusions: Successful PCI of the SVG-PDA with a single DES  Assessment & Plan    82 yo male with PMH of HTN/HLD/Asthma/BPH/Anemia/Mantle Cell Lymphoma/CAD s/p CABG 3vd with most recent PCI 10/2014 patent DES to the VG>>PDA/Carotid stenosis/PVD/DM/COPD/s/p TAVR 2015/DVT who presented to the [facility] ED with fever/chills and admitted for SIRS on 04/07/2016.   1. Atrial Fibrillation: This was a newly diagnosed problem found during this admission to the hospital, with patient denying any history of the same. He was initially noted to be in A. fib RVR on his EKG 04/07/2016, but was started on metoprolol 2.5 mg twice a day. Subsequently patient required hemodynamic intervention with the use of pressors on 04/07/2016. At that time beta blocker was held, but he has since been weaned off of pressors and started  back on low-dose metoprolol BID. He denies any recent chest pressure/pain, dizziness, lightheadedness, syncope or palpitations prior to this admission. Unable to determine when he first transitioned to atrial fibrillation.  -Currently noted to be in A. fib with generally controlled rate between 80 and 90. Blood pressure is currently stable at 137/90. Would continue current metoprolol dose, with intentions to increase if rate increases.  -His CHADSvasc score 4 with DM2, AGE, HTN., but given his current thrombocytopenia I do not believe he is a candidate for anticoagulation at this time. His current platelet count was 58 this morning. He currently denies any overt bleeding and hemoglobin is stable at 9.9. Hopefully with the resolution of his thrombocytopenia he could be started on oral at anticoagulation in the future.  -He is not a candidate for cardioversion at this time, given he has not been anticoagulated. Could consider TEE once patient is more stable. -Repeat 2-D echo is pending. -Currently on aspirin/Plavix.   2. Elevated Troponin: On admission his cardiac troponins were cycled with an initial trend up from 1.27>>1.56, but now appeared to be trending down as today's reading shows 1.19. He explicitly denies any chest pain/pressure. This could potentially be due to demand ischemia given his recent hospitalization and evolving pneumonia. He is also noted to have abdominal ascites with new findings of the above infiltrates on the liver, for which he recently had a ultrasound paracentesis with results pending.  -Given his significant cardiac history I think it would be beneficial for him to undergo cardiac workup while admitted. Continue to trend troponins. I would not consider him a candidate for cardiac catheterization at this time given his recent hemodynamic instability, ongoing pneumonia, sepsis, along with thrombocytopenia.   3. Thrombocytopenia: Current platelet count was 58 this morning. He  thought is this will hopefully resolve with this treatment of ongoing sepsis. He does have newly found splenomegaly, along with new liver lesions. Recently underwent ultrasound-guided paracentesis this a.m., with results to follow. Oncology is currently consulted regarding this issue.   -Dr. Claiborne Billings to see  Signed, Ria Comment  Mancel Bale, NP-C Pager [phone] 04/08/2016, 9:51 AM  Patient seen and examined. Agree with assessment and plan. [name] is an 82 year old male was a complex medical history as outlined above.  He has known CAD and underwent CABG surgery 3 with subsequent interventions to his vein graft to the PDA.  He also is status post TAVR for severe aortic valve stenosis and had an Edward Sapien 3, 29 mm valve implanted in November 2015.  He has history of PVD, hypertension, hyperlipidemia, and mild carotid stenoses.  He was admitted with fever chills and progressive hypotension suggestive of sepsis.  He is now growing 4 out of 4 cultures positive for enterococcus in his blood.  An obvious concern would be potential development of valvular endocarditis in this patient status post TAVR.  A transthoracic echo was recently completed.  I do not know the results.  He had developed atrial fibrillation and presently atrial fibrillation, rate is relatively controlled in the 80s with occasional PVCs.  He has diffuse expiratory wheezing on exam , making it difficult to assess for any aortic insufficiency.  He has a systolic murmur compatible with his bioprosthetic aVR. He is thrombocytopenic and not a candidate for  Anticoagulation with a platelet count today of 58,000.  He has stage III chronic kidney disease with GFR 49.  On his last echo in December 2016, ejection fraction was 50 -55%.  He is tachypnea.  His chest x-ray today showed interstitial densities consistent with scarring.  ECG shows atrial fibrillation at 114 bpm with an IVCD.  QTC is 511.  I will check a BNP.  He may need Xopenex.  I/Os are  positive at 4198 since admission.  I will give Lasix 20 mg iv now. Await TTE results and culture sensititivites of enteroccoccus with some potential for ampicillin resistance.  May nee TEE to further elucidate his valve for potential endocarditis.  Troy Sine, MD, [facility] 04/08/2016 3:04 PM

## 2016-04-08 NOTE — Progress Notes (Signed)
[facility]  Patient Status: Active (receiving services up to time of hospitalization)  [facility] is providing the following services: PT  If patient discharges after hours, please call [phone].   Edwinna Areola 04/08/2016, 3:17 PM

## 2016-04-08 NOTE — Progress Notes (Signed)
I called and spoke with the daughter to let her know that the pt was requesting for his wife and daughter to come back to see him. Pt stated to this RN and the NT "he didn't think he would make it back home." Daughter was informed of what the pt said to [place] and made aware of the pts request to see them. Will continue to monitor.

## 2016-04-09 DIAGNOSIS — D638 Anemia in other chronic diseases classified elsewhere: Secondary | ICD-10-CM

## 2016-04-09 DIAGNOSIS — T826XXA Infection and inflammatory reaction due to cardiac valve prosthesis, initial encounter: Secondary | ICD-10-CM | POA: Insufficient documentation

## 2016-04-09 DIAGNOSIS — A419 Sepsis, unspecified organism: Secondary | ICD-10-CM | POA: Insufficient documentation

## 2016-04-09 DIAGNOSIS — R6521 Severe sepsis with septic shock: Secondary | ICD-10-CM

## 2016-04-09 DIAGNOSIS — R918 Other nonspecific abnormal finding of lung field: Secondary | ICD-10-CM

## 2016-04-09 DIAGNOSIS — I38 Endocarditis, valve unspecified: Secondary | ICD-10-CM

## 2016-04-09 LAB — URINE CULTURE

## 2016-04-09 LAB — PROCALCITONIN: Procalcitonin: 2.05 ng/mL

## 2016-04-09 LAB — GLUCOSE, CAPILLARY
GLUCOSE-CAPILLARY: 191 mg/dL — AB (ref 65–99)
GLUCOSE-CAPILLARY: 176 mg/dL — AB (ref 65–99)
Glucose-Capillary: 252 mg/dL — ABNORMAL HIGH (ref 65–99)
Glucose-Capillary: 149 mg/dL — ABNORMAL HIGH (ref 65–99)

## 2016-04-09 LAB — CULTURE, BLOOD (ROUTINE X 2)

## 2016-04-09 LAB — CBC
HCT: 28.4 % — ABNORMAL LOW (ref 39.0–52.0)
Hemoglobin: 9.6 g/dL — ABNORMAL LOW (ref 13.0–17.0)
MCH: 27.6 pg (ref 26.0–34.0)
MCHC: 33.8 g/dL (ref 30.0–36.0)
MCV: 81.6 fL (ref 78.0–100.0)
PLATELETS: 59 10*3/uL — AB (ref 150–400)
RBC: 3.48 MIL/uL — AB (ref 4.22–5.81)
RDW: 17.5 % — AB (ref 11.5–15.5)
WBC: 11.1 10*3/uL — AB (ref 4.0–10.5)

## 2016-04-09 MED ORDER — SODIUM CHLORIDE 0.9 % IV SOLN
2.0000 g | Freq: Four times a day (QID) | INTRAVENOUS | Status: DC
  Administered 2016-04-09 – 2016-04-13 (×15): 2 g via INTRAVENOUS
  Filled 2016-04-09 (×18): qty 2000

## 2016-04-09 MED ORDER — DEXTROSE 5 % IV SOLN
2.0000 g | Freq: Two times a day (BID) | INTRAVENOUS | Status: DC
  Administered 2016-04-09 – 2016-04-13 (×9): 2 g via INTRAVENOUS
  Filled 2016-04-09 (×10): qty 2

## 2016-04-09 MED ORDER — IPRATROPIUM BROMIDE 0.02 % IN SOLN
0.5000 mg | Freq: Three times a day (TID) | RESPIRATORY_TRACT | Status: DC
  Administered 2016-04-10 – 2016-04-13 (×11): 0.5 mg via RESPIRATORY_TRACT
  Filled 2016-04-09 (×11): qty 2.5

## 2016-04-09 MED ORDER — HYDROCORTISONE NA SUCCINATE PF 100 MG IJ SOLR
50.0000 mg | Freq: Two times a day (BID) | INTRAMUSCULAR | Status: DC
Start: 2016-04-09 — End: 2016-04-10
  Administered 2016-04-09 – 2016-04-10 (×2): 50 mg via INTRAVENOUS
  Filled 2016-04-09 (×3): qty 1

## 2016-04-09 MED ORDER — SPIRONOLACTONE 12.5 MG HALF TABLET
12.5000 mg | ORAL_TABLET | Freq: Every day | ORAL | Status: DC
  Administered 2016-04-09 – 2016-04-13 (×5): 12.5 mg via ORAL
  Filled 2016-04-09 (×5): qty 1

## 2016-04-09 MED ORDER — LEVALBUTEROL HCL 1.25 MG/0.5ML IN NEBU
1.2500 mg | INHALATION_SOLUTION | Freq: Three times a day (TID) | RESPIRATORY_TRACT | Status: DC
  Administered 2016-04-10 – 2016-04-13 (×10): 1.25 mg via RESPIRATORY_TRACT
  Filled 2016-04-09 (×12): qty 0.5

## 2016-04-09 MED ORDER — CARVEDILOL 3.125 MG PO TABS
3.1250 mg | ORAL_TABLET | Freq: Two times a day (BID) | ORAL | Status: DC
  Administered 2016-04-09 – 2016-04-13 (×8): 3.125 mg via ORAL
  Filled 2016-04-09 (×8): qty 1

## 2016-04-09 MED ORDER — ZOLPIDEM TARTRATE 5 MG PO TABS
5.0000 mg | ORAL_TABLET | Freq: Every evening | ORAL | Status: DC | PRN
  Administered 2016-04-09 – 2016-04-12 (×4): 5 mg via ORAL
  Filled 2016-04-09 (×4): qty 1

## 2016-04-09 MED ORDER — VITAMINS A & D EX OINT
TOPICAL_OINTMENT | CUTANEOUS | Status: AC
  Administered 2016-04-09: 20:00:00
  Filled 2016-04-09: qty 5

## 2016-04-09 MED ORDER — FUROSEMIDE 10 MG/ML IJ SOLN
20.0000 mg | Freq: Two times a day (BID) | INTRAMUSCULAR | Status: DC
  Administered 2016-04-09 – 2016-04-10 (×3): 20 mg via INTRAVENOUS
  Filled 2016-04-09 (×3): qty 2

## 2016-04-09 MED ORDER — LEVALBUTEROL HCL 0.63 MG/3ML IN NEBU
0.6300 mg | INHALATION_SOLUTION | Freq: Four times a day (QID) | RESPIRATORY_TRACT | Status: DC | PRN
  Administered 2016-04-11: 0.63 mg via RESPIRATORY_TRACT
  Filled 2016-04-09: qty 3

## 2016-04-09 NOTE — Progress Notes (Signed)
Subjective: No new complaints   Antibiotics:  Anti-infectives    Start     Dose/Rate Route Frequency Ordered Stop   04/09/16 1400  ampicillin (OMNIPEN) 2 g in sodium chloride 0.9 % 50 mL IVPB     2 g 150 mL/hr over 20 Minutes Intravenous Every 6 hours 04/09/16 1130     04/09/16 1230  cefTRIAXone (ROCEPHIN) 2 g in dextrose 5 % 50 mL IVPB     2 g 100 mL/hr over 30 Minutes Intravenous Every 12 hours 04/09/16 1130     04/08/16 1200  vancomycin (VANCOCIN) IVPB 750 mg/150 ml premix  Status:  Discontinued     750 mg 150 mL/hr over 60 Minutes Intravenous Every 12 hours 04/08/16 1035 04/09/16 1129   04/07/16 2300  Ampicillin-Sulbactam (UNASYN) 3 g in sodium chloride 0.9 % 100 mL IVPB  Status:  Discontinued     3 g 100 mL/hr over 60 Minutes Intravenous Every 6 hours 04/07/16 2222 04/09/16 1130   04/07/16 1800  vancomycin (VANCOCIN) IVPB 750 mg/150 ml premix  Status:  Discontinued     750 mg 150 mL/hr over 60 Minutes Intravenous Every 12 hours 04/07/16 0537 04/07/16 2222   04/07/16 1400  aztreonam (AZACTAM) 1 g in dextrose 5 % 50 mL IVPB  Status:  Discontinued     1 g 100 mL/hr over 30 Minutes Intravenous Every 8 hours 04/07/16 0537 04/07/16 0847   04/07/16 1000  ceFEPIme (MAXIPIME) 2 g in dextrose 5 % 50 mL IVPB  Status:  Discontinued     2 g 100 mL/hr over 30 Minutes Intravenous Every 12 hours 04/07/16 0848 04/07/16 2222   04/07/16 0430  aztreonam (AZACTAM) 2 g in dextrose 5 % 50 mL IVPB  Status:  Discontinued     2 g 100 mL/hr over 30 Minutes Intravenous  Once 04/07/16 0419 04/07/16 0420   04/07/16 0430  vancomycin (VANCOCIN) IVPB 1000 mg/200 mL premix  Status:  Discontinued     1,000 mg 200 mL/hr over 60 Minutes Intravenous  Once 04/07/16 0419 04/07/16 0420   04/07/16 0000  levofloxacin (LEVAQUIN) IVPB 750 mg     750 mg 100 mL/hr over 90 Minutes Intravenous  Once 04/06/16 2345 04/07/16 0235   04/07/16 0000  aztreonam (AZACTAM) 2 g in dextrose 5 % 50 mL IVPB     2 g 100  mL/hr over 30 Minutes Intravenous  Once 04/06/16 2345 04/07/16 0329   04/07/16 0000  vancomycin (VANCOCIN) IVPB 1000 mg/200 mL premix     1,000 mg 200 mL/hr over 60 Minutes Intravenous  Once 04/06/16 2345 04/07/16 0437      Medications: Scheduled Meds: . ampicillin (OMNIPEN) IV  2 g Intravenous Q6H  . aspirin EC  81 mg Oral Daily  . carvedilol  3.125 mg Oral BID WC  . cefTRIAXone (ROCEPHIN)  IV  2 g Intravenous Q12H  . clopidogrel  75 mg Oral Daily  . feeding supplement (ENSURE ENLIVE)  237 mL Oral TID BM  . furosemide  20 mg Intravenous BID  . gabapentin  300 mg Oral BID  . guaiFENesin  600 mg Oral BID  . hydrocortisone sod succinate (SOLU-CORTEF) inj  50 mg Intravenous Q12H  . insulin aspart  0-9 Units Subcutaneous TID WC  . ipratropium  0.5 mg Nebulization BID  . isosorbide mononitrate  30 mg Oral Daily  . levalbuterol  1.25 mg Nebulization BID  . pravastatin  40 mg Oral Daily  . spironolactone  12.5 mg Oral Daily  . vitamin B-12  1,000 mcg Oral Daily   Continuous Infusions:  PRN Meds:.acetaminophen, hydrOXYzine, nitroGLYCERIN    Objective: Weight change:   Intake/Output Summary (Last 24 hours) at 04/09/16 1532 Last data filed at 04/09/16 1433  Gross per 24 hour  Intake    920 ml  Output   2150 ml  Net  -1230 ml   Blood pressure 121/85, pulse 79, temperature 97.7 F (36.5 C), temperature source Axillary, resp. rate 18, height 6' (1.829 m), weight 190 lb 4.1 oz (86.3 kg), SpO2 99 %. Temp:  [96.4 F (35.8 C)-98.8 F (37.1 C)] 97.7 F (36.5 C) (05/05 1304) Pulse Rate:  [37-138] 79 (05/05 1304) Resp:  [18-30] 18 (05/05 1304) BP: (112-154)/(49-105) 121/85 mmHg (05/05 1304) SpO2:  [97 %-100 %] 99 % (05/05 1304) Weight:  [190 lb 4.1 oz (86.3 kg)] 190 lb 4.1 oz (86.3 kg) (05/05 1304)  Physical Exam:  General: Alert and awake, oriented x3 and appears much more comfortable HEENT: anicteric sclera, EOMI, oropharynx clear and without exudate Cardiovascular: regular  rate, normal r, no murmur rubs or gallops heard Pulmonary: tachypneic with wheezes throughout Gastrointestinal: soft nontender, nondistended, normal bowel sounds, Musculoskeletal: + edema Skin, soft tissue: no rashes Neuro: nonfocal, strength and sensation intact   CBC:  CBC Latest Ref Rng 04/09/2016 04/08/2016 04/07/2016  WBC 4.0 - 10.5 K/uL 11.1(H) 9.0 15.4(H)  Hemoglobin 13.0 - 17.0 g/dL 9.6(L) 9.9(L) 9.4(L)  Hematocrit 39.0 - 52.0 % 28.4(L) 29.7(L) 28.6(L)  Platelets 150 - 400 K/uL 59(L) 58(L) 60(L)       BMET  Recent Labs  04/07/16 1730 04/08/16 0322  NA 132* 130*  K 5.0 4.6  CL 105 104  CO2 17* 14*  GLUCOSE 117* 171*  BUN 32* 38*  CREATININE 1.23 1.27*  CALCIUM 8.0* 8.3*     Liver Panel   Recent Labs  04/07/16 1730 04/08/16 0322  PROT 4.9*  4.8* 5.1*  ALBUMIN 2.9*  2.9* 3.0*  AST 56* 62*  ALT 36 40  ALKPHOS 192* 200*  BILITOT 1.7* 1.7*  BILIDIR  --  0.9*       Sedimentation Rate No results for input(s): ESRSEDRATE in the last 72 hours. C-Reactive Protein No results for input(s): CRP in the last 72 hours.  Micro Results: Recent Results (from the past 720 hour(s))  Culture, blood (routine x 2) Call MD if unable to obtain prior to antibiotics being given     Status: None   Collection Time: 03/12/16  7:55 PM  Result Value Ref Range Status   Specimen Description BLOOD BLOOD RIGHT FOREARM  Final   Special Requests BOTTLES DRAWN AEROBIC AND ANAEROBIC 5CC EACH  Final   Culture   Final    NO GROWTH 5 DAYS Performed at [facility]    Report Status 03/18/2016 FINAL  Final  Respiratory virus panel     Status: None   Collection Time: 03/12/16  9:52 PM  Result Value Ref Range Status   Respiratory Syncytial Virus A Negative Negative Final   Respiratory Syncytial Virus B Negative Negative Final   Influenza A Negative Negative Final   Influenza B Negative Negative Final   Parainfluenza 1 Negative Negative Final   Parainfluenza 2 Negative  Negative Final   Parainfluenza 3 Negative Negative Final   Metapneumovirus Negative Negative Final   Rhinovirus Negative Negative Final   Adenovirus Negative Negative Final    Comment: (NOTE) Performed At: [place],   [id number] Lindon Romp MD [id number]   Culture, blood (routine x 2) Call MD if unable to obtain prior to antibiotics being given     Status: None   Collection Time: 03/13/16  5:25 AM  Result Value Ref Range Status   Specimen Description BLOOD LEFT ANTECUBITAL  Final   Special Requests IN PEDIATRIC BOTTLE 3CC  Final   Culture   Final    NO GROWTH 5 DAYS Performed at [facility]    Report Status 03/18/2016 FINAL  Final  Culture, sputum-assessment     Status: None   Collection Time: 03/13/16  8:16 PM  Result Value Ref Range Status   Specimen Description SPUTUM  Final   Special Requests NONE  Final   Sputum evaluation THIS SPECIMEN IS ACCEPTABLE FOR SPUTUM CULTURE  Final   Report Status 03/13/2016 FINAL  Final  Culture, respiratory (NON-Expectorated)     Status: None   Collection Time: 03/13/16  8:16 PM  Result Value Ref Range Status   Specimen Description SPU  Final   Special Requests NONE  Final   Gram Stain   Final    ABUNDANT WBC PRESENT, PREDOMINANTLY PMN RARE SQUAMOUS EPITHELIAL CELLS PRESENT FEW GRAM POSITIVE COCCI IN PAIRS IN CLUSTERS THIS SPECIMEN IS ACCEPTABLE FOR SPUTUM CULTURE Performed at [facility]    Culture   Final    NORMAL OROPHARYNGEAL FLORA Performed at [facility]    Report Status 03/16/2016 FINAL  Final  Blood Culture (routine x 2)     Status: Abnormal   Collection Time: 04/07/16 12:13 AM  Result Value Ref Range Status   Specimen Description BLOOD RIGHT ANTECUBITAL  Final   Special Requests BOTTLES DRAWN AEROBIC AND ANAEROBIC 5CC  Final   Culture  Setup Time   Final    GRAM POSITIVE COCCI IN CHAINS IN PAIRS IN BOTH AEROBIC AND ANAEROBIC BOTTLES Organism ID to  follow CRITICAL RESULT CALLED TO, READ BACK BY AND VERIFIED WITH: C SHADE PHARMD 2027 04/07/16 A BROWNING Performed at [place] (A)  Final   Report Status 04/09/2016 FINAL  Final   Organism ID, Bacteria ENTEROCOCCUS SPECIES  Final      Susceptibility   Enterococcus species - MIC*    AMPICILLIN <=2 SENSITIVE Sensitive     VANCOMYCIN 1 SENSITIVE Sensitive     GENTAMICIN SYNERGY RESISTANT Resistant     * ENTEROCOCCUS SPECIES  Blood Culture (routine x 2)     Status: Abnormal   Collection Time: 04/07/16 12:13 AM  Result Value Ref Range Status   Specimen Description BLOOD LEFT HAND  Final   Special Requests BOTTLES DRAWN AEROBIC AND ANAEROBIC 4CC  Final   Culture  Setup Time   Final    GRAM POSITIVE COCCI IN CHAINS IN PAIRS IN BOTH AEROBIC AND ANAEROBIC BOTTLES CRITICAL RESULT CALLED TO, READ BACK BY AND VERIFIED WITH: J. GADHIA, PHARM D AT 1000 ON [id number] BY Rhea Bleacher    Culture (A)  Final    ENTEROCOCCUS SPECIES SUSCEPTIBILITIES PERFORMED ON PREVIOUS CULTURE WITHIN THE LAST 5 DAYS. Performed at [facility]    Report Status 04/09/2016 FINAL  Final  Blood Culture ID Panel (Reflexed)     Status: Abnormal   Collection Time: 04/07/16 12:13 AM  Result Value Ref Range Status   Enterococcus species DETECTED (A) NOT DETECTED Final    Comment: CRITICAL RESULT CALLED TO, READ BACK BY AND VERIFIED WITH: C SHADE PHARMD 2027  04/07/16 A BROWNING    Vancomycin resistance NOT DETECTED NOT DETECTED Final   Listeria monocytogenes NOT DETECTED NOT DETECTED Final   Staphylococcus species NOT DETECTED NOT DETECTED Final   Staphylococcus aureus NOT DETECTED NOT DETECTED Final   Methicillin resistance NOT DETECTED NOT DETECTED Final   Streptococcus species NOT DETECTED NOT DETECTED Final   Streptococcus agalactiae NOT DETECTED NOT DETECTED Final   Streptococcus pneumoniae NOT DETECTED NOT DETECTED Final   Streptococcus pyogenes NOT DETECTED NOT  DETECTED Final   Acinetobacter baumannii NOT DETECTED NOT DETECTED Final   Enterobacteriaceae species NOT DETECTED NOT DETECTED Final   Enterobacter cloacae complex NOT DETECTED NOT DETECTED Final   Escherichia coli NOT DETECTED NOT DETECTED Final   Klebsiella oxytoca NOT DETECTED NOT DETECTED Final   Klebsiella pneumoniae NOT DETECTED NOT DETECTED Final   Proteus species NOT DETECTED NOT DETECTED Final   Serratia marcescens NOT DETECTED NOT DETECTED Final   Carbapenem resistance NOT DETECTED NOT DETECTED Final   Haemophilus influenzae NOT DETECTED NOT DETECTED Final   Neisseria meningitidis NOT DETECTED NOT DETECTED Final   Pseudomonas aeruginosa NOT DETECTED NOT DETECTED Final   Candida albicans NOT DETECTED NOT DETECTED Final   Candida glabrata NOT DETECTED NOT DETECTED Final   Candida krusei NOT DETECTED NOT DETECTED Final   Candida parapsilosis NOT DETECTED NOT DETECTED Final   Candida tropicalis NOT DETECTED NOT DETECTED Final    Comment: Performed at [facility]  Urine culture     Status: Abnormal   Collection Time: 04/07/16 12:40 AM  Result Value Ref Range Status   Specimen Description URINE, CATHETERIZED  Final   Special Requests NONE  Final   Culture >=100,000 COLONIES/mL ENTEROCOCCUS SPECIES (A)  Final   Report Status 04/09/2016 FINAL  Final   Organism ID, Bacteria ENTEROCOCCUS SPECIES (A)  Final      Susceptibility   Enterococcus species - MIC*    AMPICILLIN <=2 SENSITIVE Sensitive     LEVOFLOXACIN >=8 RESISTANT Resistant     NITROFURANTOIN <=16 SENSITIVE Sensitive     VANCOMYCIN 1 SENSITIVE Sensitive     * >=100,000 COLONIES/mL ENTEROCOCCUS SPECIES  MRSA PCR Screening     Status: None   Collection Time: 04/07/16  7:32 AM  Result Value Ref Range Status   MRSA by PCR NEGATIVE NEGATIVE Final    Comment:        The GeneXpert MRSA Assay (FDA approved for NASAL specimens only), is one component of a comprehensive MRSA colonization surveillance program. It is  not intended to diagnose MRSA infection nor to guide or monitor treatment for MRSA infections.   Gram stain     Status: None   Collection Time: 04/08/16 10:11 AM  Result Value Ref Range Status   Specimen Description FLUID ASCITIC  Final   Special Requests NONE  Final   Gram Stain   Final    MODERATE WBC PRESENT,BOTH PMN AND MONONUCLEAR NO ORGANISMS SEEN Performed at [facility]    Report Status 04/08/2016 FINAL  Final    Studies/Results: US Paracentesis  04/08/2016  INDICATION: Patient with history of sepsis/bacteremia, mantle cell lymphoma, ascites. Request made for diagnostic and therapeutic paracentesis. EXAM: ULTRASOUND GUIDED DIAGNOSTIC AND THERAPEUTIC PARACENTESIS MEDICATIONS: None. COMPLICATIONS: None immediate. PROCEDURE: Informed written consent was obtained from the patient's family after a discussion of the risks, benefits and alternatives to treatment. A timeout was performed prior to the initiation of the procedure. Initial ultrasound scanning demonstrates a small amount of ascites within the right  lower abdominal quadrant. The right lower abdomen was prepped and draped in the usual sterile fashion. 1% lidocaine was used for local anesthesia. Following this, a Yueh catheter was introduced. An ultrasound image was saved for documentation purposes. The paracentesis was performed. The catheter was removed and a dressing was applied. The patient tolerated the procedure well without immediate post procedural complication. FINDINGS: A total of approximately 1.2 liters of slightly hazy, yellow fluid was removed. Samples were sent to the laboratory as requested by the clinical team. IMPRESSION: Successful ultrasound-guided diagnostic and therapeutic paracentesis yielding 1.2 liters of peritoneal fluid. Read by: Rowe Robert, PA-C Electronically Signed   By: Jacqulynn Cadet M.D.   On: 04/08/2016 11:46   Dg Chest Port 1 View  04/08/2016  CLINICAL DATA:  Wheezing, cough. EXAM:  PORTABLE CHEST 1 VIEW COMPARISON:  Apr 06, 2016. FINDINGS: Stable cardiomegaly. Status post Coronary artery bypass graft. No pneumothorax or pleural effusion is noted. Stable interstitial densities are noted throughout both lungs most consistent with scarring. Bony thorax appears intact. IMPRESSION: Stable interstitial densities are noted throughout both lungs most consistent with scarring. No significant changes noted compared to prior exam. Electronically Signed   By: Marijo Conception, M.D.   On: 04/08/2016 09:19      Assessment/Plan:  INTERVAL HISTORY:   Cultures yielding AMP S enterococcus though not synergistic with Norva Karvonen   Principal Problem:   SIRS (systemic inflammatory response syndrome) (HCC) Active Problems:   Essential hypertension   Mantle cell lymphoma (HCC)   S/P TAVR (transcatheter aortic valve replacement)   Type 2 diabetes mellitus with vascular disease (HCC)   Pressure ulcer   Ascites   Pyrexia   Sepsis (HCC)   Enterococcal sepsis (HCC)   Paroxysmal atrial fibrillation (HCC)   Wheezing   Diabetic foot ulcer associated with type 1 diabetes mellitus ([place])    HAO PARRA is a 82 y.o. 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

## 2016-04-09 NOTE — Progress Notes (Signed)
[name]   DOB:06/02/1927   [id number]    Subjective: The patient feels a little better, sitting up and attempting to eat breakfast. Since the last time I saw him, he has diagnostic and therapeutic paracentesis. Blood culture came back enterococcal bacteremia in the setting of prosthetic heart valve. The patient has clinical signs of septic shock and remain in the ICU with intermittent pressor support.  Objective:  Filed Vitals:   04/09/16 0800 04/09/16 0900  BP: 136/81 133/86  Pulse: 138 37  Temp: 97.7 F (36.5 C) 97.9 F (36.6 C)  Resp: 23 21     Intake/Output Summary (Last 24 hours) at 04/09/16 1030 Last data filed at 04/09/16 0600  Gross per 24 hour  Intake   1120 ml  Output   1650 ml  Net   -530 ml    GENERAL:alert, no distress and comfortable. He looks cheerful SKIN: skin color is pale, texture, turgor are normal, no rashes or significant lesions EYES: normal, Conjunctiva are pink and non-injected, sclera clear Musculoskeletal:no cyanosis of digits and no clubbing  NEURO: alert & oriented x 3 with fluent speech, no focal motor/sensory deficits   Labs:  Lab Results  Component Value Date   WBC 11.1* 04/09/2016   HGB 9.6* 04/09/2016   HCT 28.4* 04/09/2016   MCV 81.6 04/09/2016   PLT 59* 04/09/2016   NEUTROABS 13.1* 04/07/2016    Lab Results  Component Value Date   NA 130* 04/08/2016   K 4.6 04/08/2016   CL 104 04/08/2016   CO2 14* 04/08/2016    Studies:  Ct Abdomen Pelvis Wo Contrast  04/07/2016  CLINICAL DATA:  Abdominal pain.  Mild diffuse abdominal tenderness. EXAM: CT CHEST, ABDOMEN AND PELVIS WITHOUT CONTRAST TECHNIQUE: Multidetector CT imaging of the chest, abdomen and pelvis was performed following the standard protocol without IV contrast. COMPARISON:  03/12/2016 FINDINGS: CT CHEST FINDINGS Mediastinum/Lymph Nodes: There is moderate cardiac enlargement. Calcification of the mitral valve noted. There is a prosthetic aortic valve. Previous median  sternotomy and CABG procedure noted. The trachea appears patent and is midline. Normal appearance of the esophagus. No axillary or supraclavicular adenopathy. No mediastinal or hilar adenopathy. Lungs/Pleura: Small pleural effusions are identified bilaterally. There are bilateral lower lobe pulmonary opacities which appear stable to slightly improved from previous exam, image 77 of series 4. Musculoskeletal: No chest wall mass or suspicious bone lesions identified. CT ABDOMEN PELVIS FINDINGS Hepatobiliary: Hypertrophy of the lateral segment of left lobe of liver and caudate lobe of liver identified. Findings are suggestive of cirrhosis. Ill defined low-attenuation lesion within the right lobe of liver measures 1.8 cm, image 59 of series 2. This appears new from 10/08/2014 and is indeterminate. Posterior right lobe of liver low-attenuation structure measures 1.5 cm, image 65 of series 2. Also new from 10/08/2014. Gallbladder is normal. No biliary dilatation. Pancreas: No mass or inflammatory process identified on this un-enhanced exam. Spleen: The spleen is enlarged measuring 17 cm in cranial caudal dimension. Adrenals/Urinary Tract: Normal adrenal glands. Unremarkable appearance of the right kidney peer the lower pole calculus measures 6 mm, image 77 of series 2. Mild diffuse bladder wall thickening. Small diverticula arises from the right lateral wall of the bladder, image 115 of series 2 Stomach/Bowel: The stomach is normal. The small bowel loops have a normal course and caliber. There is no evidence for bowel obstruction. No pathologic dilatation of the colon. Multiple distal colonic diverticula noted. Vascular/Lymphatic: Calcified atherosclerotic disease involves the abdominal aorta. No aneurysm. No  adenopathy identified within the abdomen or pelvis. Reproductive: Prostate gland and seminal vesicles are unremarkable. Other: There is moderate ascites identified within the abdomen and pelvis. No focal fluid  collections identified. Musculoskeletal: Spondylosis is identified within the lumbar spine. This is most advanced at the L5-S1 level. No aggressive lytic or sclerotic bone lesions noted. IMPRESSION: 1. No findings identified to explain patient's sepsis. 2. Morphologic features a liver are concerning for cirrhosis. 3. There are 2 indeterminate low-attenuation foci within the right lobe of liver, new from 10/08/2014. More definitive characterization with contrast enhanced MRI of the liver is suggested as patient's clinical condition tolerates. 4. Splenomegaly 5. Ascites 6. Bilateral pleural effusions with mild bilateral lower lobe pneumonitis. Aeration to the lower lobes appear slightly improved from 03/12/2016. 7. Aortic atherosclerosis 8. Lumbar spondylosis. Electronically Signed   By: Kerby Moors M.D.   On: 04/07/2016 11:58   Ct Chest Wo Contrast  04/07/2016  CLINICAL DATA:  Abdominal pain.  Mild diffuse abdominal tenderness. EXAM: CT CHEST, ABDOMEN AND PELVIS WITHOUT CONTRAST TECHNIQUE: Multidetector CT imaging of the chest, abdomen and pelvis was performed following the standard protocol without IV contrast. COMPARISON:  03/12/2016 FINDINGS: CT CHEST FINDINGS Mediastinum/Lymph Nodes: There is moderate cardiac enlargement. Calcification of the mitral valve noted. There is a prosthetic aortic valve. Previous median sternotomy and CABG procedure noted. The trachea appears patent and is midline. Normal appearance of the esophagus. No axillary or supraclavicular adenopathy. No mediastinal or hilar adenopathy. Lungs/Pleura: Small pleural effusions are identified bilaterally. There are bilateral lower lobe pulmonary opacities which appear stable to slightly improved from previous exam, image 77 of series 4. Musculoskeletal: No chest wall mass or suspicious bone lesions identified. CT ABDOMEN PELVIS FINDINGS Hepatobiliary: Hypertrophy of the lateral segment of left lobe of liver and caudate lobe of liver identified.  Findings are suggestive of cirrhosis. Ill defined low-attenuation lesion within the right lobe of liver measures 1.8 cm, image 59 of series 2. This appears new from 10/08/2014 and is indeterminate. Posterior right lobe of liver low-attenuation structure measures 1.5 cm, image 65 of series 2. Also new from 10/08/2014. Gallbladder is normal. No biliary dilatation. Pancreas: No mass or inflammatory process identified on this un-enhanced exam. Spleen: The spleen is enlarged measuring 17 cm in cranial caudal dimension. Adrenals/Urinary Tract: Normal adrenal glands. Unremarkable appearance of the right kidney peer the lower pole calculus measures 6 mm, image 77 of series 2. Mild diffuse bladder wall thickening. Small diverticula arises from the right lateral wall of the bladder, image 115 of series 2 Stomach/Bowel: The stomach is normal. The small bowel loops have a normal course and caliber. There is no evidence for bowel obstruction. No pathologic dilatation of the colon. Multiple distal colonic diverticula noted. Vascular/Lymphatic: Calcified atherosclerotic disease involves the abdominal aorta. No aneurysm. No adenopathy identified within the abdomen or pelvis. Reproductive: Prostate gland and seminal vesicles are unremarkable. Other: There is moderate ascites identified within the abdomen and pelvis. No focal fluid collections identified. Musculoskeletal: Spondylosis is identified within the lumbar spine. This is most advanced at the L5-S1 level. No aggressive lytic or sclerotic bone lesions noted. IMPRESSION: 1. No findings identified to explain patient's sepsis. 2. Morphologic features a liver are concerning for cirrhosis. 3. There are 2 indeterminate low-attenuation foci within the right lobe of liver, new from 10/08/2014. More definitive characterization with contrast enhanced MRI of the liver is suggested as patient's clinical condition tolerates. 4. Splenomegaly 5. Ascites 6. Bilateral pleural effusions with  mild bilateral lower lobe  pneumonitis. Aeration to the lower lobes appear slightly improved from 03/12/2016. 7. Aortic atherosclerosis 8. Lumbar spondylosis. Electronically Signed   By: Kerby Moors M.D.   On: 04/07/2016 11:58   US Paracentesis  04/08/2016  INDICATION: Patient with history of sepsis/bacteremia, mantle cell lymphoma, ascites. Request made for diagnostic and therapeutic paracentesis. EXAM: ULTRASOUND GUIDED DIAGNOSTIC AND THERAPEUTIC PARACENTESIS MEDICATIONS: None. COMPLICATIONS: None immediate. PROCEDURE: Informed written consent was obtained from the patient's family after a discussion of the risks, benefits and alternatives to treatment. A timeout was performed prior to the initiation of the procedure. Initial ultrasound scanning demonstrates a small amount of ascites within the right lower abdominal quadrant. The right lower abdomen was prepped and draped in the usual sterile fashion. 1% lidocaine was used for local anesthesia. Following this, a Yueh catheter was introduced. An ultrasound image was saved for documentation purposes. The paracentesis was performed. The catheter was removed and a dressing was applied. The patient tolerated the procedure well without immediate post procedural complication. FINDINGS: A total of approximately 1.2 liters of slightly hazy, yellow fluid was removed. Samples were sent to the laboratory as requested by the clinical team. IMPRESSION: Successful ultrasound-guided diagnostic and therapeutic paracentesis yielding 1.2 liters of peritoneal fluid. Read by: Rowe Robert, PA-C Electronically Signed   By: Jacqulynn Cadet M.D.   On: 04/08/2016 11:46   Dg Chest Port 1 View  04/08/2016  CLINICAL DATA:  Wheezing, cough. EXAM: PORTABLE CHEST 1 VIEW COMPARISON:  Apr 06, 2016. FINDINGS: Stable cardiomegaly. Status post Coronary artery bypass graft. No pneumothorax or pleural effusion is noted. Stable interstitial densities are noted throughout both lungs most  consistent with scarring. Bony thorax appears intact. IMPRESSION: Stable interstitial densities are noted throughout both lungs most consistent with scarring. No significant changes noted compared to prior exam. Electronically Signed   By: Marijo Conception, M.D.   On: 04/08/2016 09:19    Assessment & Plan:   History of mantle cell lymphoma I reviewed his imaging study in great details. It is not clear to me that the patient has definitive signs of recurrence of mantle cell lymphoma. He would need further workup as an outpatient including PET CT scan but I would not do it right now as the patient is critically ill  Leukocytosis secondary to sepsis, enterococcal bactermia The patient is currently in the ICU with broad-spectrum IV antibiotics. ID is consulted  Atrial fibrillation, elevated troponin and History of TAVR, on chronic anticoagulation therapy He is currently on IV heparin. Continue aggressive medical management  Chronic splenomegaly, suspect liver cirrhosis I suspect the cause of the splenomegaly is related to liver cirrhosis although persistent mantle cell lymphoma cannot be excluded I will consider PET imaging as an outpatient  Anemia of chronic disease This is likely due to recent treatment. The patient denies recent history of bleeding such as epistaxis, hematuria or hematochezia. He is asymptomatic from the anemia. I will observe for now. He does not require transfusion now  Abnormal liver function tests with elevated liver enzymes and new ascites, indeterminate liver lesions It is not clear to me that he has new liver lesions. The imaging is of poor quality without contrast enhancement. Consider MRI in the near future or PET CT scan after dismissal from the hospital Paracentesis was performed on 04/08/16, cytology pending  Discharge planning and goals of care The patient is elderly with major medical comorbidities and is currently critically ill Consider palliative consult  in the near future if the patient does  not improve He will likely remain in the ICU over the next few days Continue aggressive supportive care for now The patient's code status is DO NOT RESUSCITATE I had a long discussion with the hospitalist. At present time, I am not contributing much to his care. I will return to check on him periodically. If the patient improves with antibiotic therapy and is subsequently released from the hospital, I will set up follow-up appointment to seen back in the outpatient. Please call if questions arise.  Walker, [place], MD 04/09/2016  10:30 AM

## 2016-04-09 NOTE — Progress Notes (Signed)
PROGRESS NOTE  [name] [id number] DOB: January 10, 1927 DOA: 04/06/2016 PCP: Donnajean Lopes, MD  HPI/Recap of past 24 hours:  Feeling better, reported abdominal pain has resolved, denies chest pain, bp stabilized off pressor  Developed intermittent wheezing    Assessment/Plan: Principal Problem:   SIRS (systemic inflammatory response syndrome) ([place]) Active Problems:   Essential hypertension   Mantle cell lymphoma (HCC)   S/P TAVR (transcatheter aortic valve replacement)   Type 2 diabetes mellitus with vascular disease (HCC)   Pressure ulcer   Ascites   Pyrexia   Sepsis (HCC)   Enterococcal sepsis (HCC)   Paroxysmal atrial fibrillation ([place])   Wheezing   Diabetic foot ulcer associated with type 1 diabetes mellitus ([place])  Sepsis/spetic shock with enterococcus bacteremia: required icu consult, treated with pressor briefly on 5/3. Stress dose steroids, ivf. Broad spectrum abx. Vital stabilized on 5/4. Leukocytosis/lactic acid normalized, patient has history of TAVR, 2DEcho no vegetation seen ,  repeat blood culture to ensure clearance. Source of bacteremia? He did complain of ab pain on presentation, US paracentesis on 5/4 with 226 wbc 72% neutrophil, gram stain no organism, culture pending .  He was also treated for pna prior to this hospitalization. ID consulted, input appreciated, now on amp/rocephin double coverage, will likely need total of 6 weeks to empirically cover endocarditis ( not able to perform TEE due to thrombocytopenia)  Bacteremia: was on vanc/cefepime, then unasyn, then amp/rocephin per ID recommendation.  Wheezing developed on 5/4 am, stat cxr no acute findings , does has chronic scarring, no significant sign of fluids overload, will d/c ivf, start xopenex/atrovent, trial dose of lasix  H/o CAD s/p CABG with stenting: patient does has Elevated troponin in the setting of sepsis, denies chest pain, no acute chest pain, he was started on heparin drip briefly  in the ED, this was stopped later due to thrombocytopenia, echo with reduced ef, he is continued on plavix/statin. Cardiology consulted  Afib: new diagnosis. CHADSvasc score 4 with dm2, age, htn. But he had thrombocytopenia, started lopressor if bp allows,  cardiology consulted. meds adjusted.  Thrombocytopenia, hopefully this will recover with treatment of sepsis. He does has splenomegaly.   noninsulin dependent dm2, metformin held, on ssi here  HTN: bp med held on 5/3 due to septic shock, bp stabilized off pressor on 5/4. Restart imdur, start low dose lopressor.  H/o mantle cell lymphoma: CT scan with new liver lesions, splenomegaly, small pleural effusion, small ascites, US guided paracentesis with 1.2liter of slightly hazy, yellow fluids, oncology input appreciated.    Code Status: DNR  Family Communication: patient   Disposition Plan: remain in stepdown today, may be able to med tele in 1-2 days if continue to be stable.   Consultants:  pccm  Oncology  Cardiology  ID  Procedures:  US guided paracentesis on 5/4  Antibiotics:  Vanc/cefepime on 5/3  unasyn on 5/4---5/5  Amp/rocephin from 5/5   Objective: BP 133/86 mmHg  Pulse 37  Temp(Src) 97.9 F (36.6 C) (Core (Comment))  Resp 21  Ht 6' (1.829 m)  Wt 80.3 kg (177 lb 0.5 oz)  BMI 24.00 kg/m2  SpO2 100%  Intake/Output Summary (Last 24 hours) at 04/09/16 1005 Last data filed at 04/09/16 0600  Gross per 24 hour  Intake   1120 ml  Output   1650 ml  Net   -530 ml   Filed Weights   04/07/16 0005 04/07/16 0416  Weight: 79.379 kg (175 lb) 80.3 kg (177 lb 0.5  oz)    Exam:   General:  frail  Cardiovascular: IRRR  Respiratory: bilateral wheezing  Abdomen: Soft/ND/NT, positive BS  Musculoskeletal: No Edema, 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  Neuro: not oriented to time but to place and person  Data Reviewed: Basic Metabolic Panel:  Recent Labs Lab 04/07/16 0013 04/07/16 0421  04/07/16 1730 04/08/16 0322  NA 129* 130* 132* 130*  K 4.3 4.4 5.0 4.6  CL 97* 102 105 104  CO2 21* 18* 17* 14*  GLUCOSE 188* 195* 117* 171*  BUN 27* 28* 32* 38*  CREATININE 1.21 1.15 1.23 1.27*  CALCIUM 9.4 8.6* 8.0* 8.3*   Liver Function Tests:  Recent Labs Lab 04/07/16 0013 04/07/16 0421 04/07/16 1730 04/08/16 0322  AST 50* 73* 56* 62*  ALT 31 37 36 40  ALKPHOS 221* 220* 192* 200*  BILITOT 1.9* 2.2* 1.7* 1.7*  PROT 5.7* 5.1* 4.9*  4.8* 5.1*  ALBUMIN 3.6 3.2* 2.9*  2.9* 3.0*   No results for input(s): LIPASE, AMYLASE in the last 168 hours. No results for input(s): AMMONIA in the last 168 hours. CBC:  Recent Labs Lab 04/07/16 0013 04/07/16 0421 04/08/16 0322 04/09/16 0338  WBC 12.9* 15.4* 9.0 11.1*  NEUTROABS 11.0* 13.1*  --   --   HGB 9.2* 9.4* 9.9* 9.6*  HCT 27.7* 28.6* 29.7* 28.4*  MCV 81.0 82.7 83.0 81.6  PLT 74* 60* 58* 59*   Cardiac Enzymes:    Recent Labs Lab 04/07/16 0421 04/07/16 1013 04/07/16 1730 04/08/16 0322  TROPONINI 1.29* 1.27* 1.56* 1.19*   BNP (last 3 results)  Recent Labs  03/12/16 1856 04/08/16 1619  BNP 2301.3* 3365.9*    ProBNP (last 3 results) No results for input(s): PROBNP in the last 8760 hours.  CBG:  Recent Labs Lab 04/08/16 0830 04/08/16 1147 04/08/16 1611 04/08/16 2132 04/09/16 0836  GLUCAP 196* 246* 220* 157* 191*    Recent Results (from the past 240 hour(s))  Blood Culture (routine x 2)     Status: Abnormal (Preliminary result)   Collection Time: 04/07/16 12:13 AM  Result Value Ref Range Status   Specimen Description BLOOD RIGHT ANTECUBITAL  Final   Special Requests BOTTLES DRAWN AEROBIC AND ANAEROBIC 5CC  Final   Culture  Setup Time   Final    GRAM POSITIVE COCCI IN CHAINS IN PAIRS IN BOTH AEROBIC AND ANAEROBIC BOTTLES Organism ID to follow CRITICAL RESULT CALLED TO, READ BACK BY AND VERIFIED WITH: C SHADE PHARMD 2027 04/07/16 A BROWNING    Culture (A)  Final    ENTEROCOCCUS  SPECIES SUSCEPTIBILITIES TO FOLLOW Performed at [facility]    Report Status PENDING  Incomplete  Blood Culture (routine x 2)     Status: Abnormal (Preliminary result)   Collection Time: 04/07/16 12:13 AM  Result Value Ref Range Status   Specimen Description BLOOD LEFT HAND  Final   Special Requests BOTTLES DRAWN AEROBIC AND ANAEROBIC 4CC  Final   Culture  Setup Time   Final    GRAM POSITIVE COCCI IN CHAINS IN PAIRS IN BOTH AEROBIC AND ANAEROBIC BOTTLES CRITICAL RESULT CALLED TO, READ BACK BY AND VERIFIED WITH: Ilda Basset D AT 1000 ON [id number] BY Rhea Bleacher Performed at [facility] (A)  Final   Report Status PENDING  Incomplete  Blood Culture ID Panel (Reflexed)     Status: Abnormal   Collection Time: 04/07/16 12:13 AM  Result Value Ref Range Status  Enterococcus species DETECTED (A) NOT DETECTED Final    Comment: CRITICAL RESULT CALLED TO, READ BACK BY AND VERIFIED WITH: C SHADE PHARMD 2027 04/07/16 A BROWNING    Vancomycin resistance NOT DETECTED NOT DETECTED Final   Listeria monocytogenes NOT DETECTED NOT DETECTED Final   Staphylococcus species NOT DETECTED NOT DETECTED Final   Staphylococcus aureus NOT DETECTED NOT DETECTED Final   Methicillin resistance NOT DETECTED NOT DETECTED Final   Streptococcus species NOT DETECTED NOT DETECTED Final   Streptococcus agalactiae NOT DETECTED NOT DETECTED Final   Streptococcus pneumoniae NOT DETECTED NOT DETECTED Final   Streptococcus pyogenes NOT DETECTED NOT DETECTED Final   Acinetobacter baumannii NOT DETECTED NOT DETECTED Final   Enterobacteriaceae species NOT DETECTED NOT DETECTED Final   Enterobacter cloacae complex NOT DETECTED NOT DETECTED Final   Escherichia coli NOT DETECTED NOT DETECTED Final   Klebsiella oxytoca NOT DETECTED NOT DETECTED Final   Klebsiella pneumoniae NOT DETECTED NOT DETECTED Final   Proteus species NOT DETECTED NOT DETECTED Final   Serratia marcescens  NOT DETECTED NOT DETECTED Final   Carbapenem resistance NOT DETECTED NOT DETECTED Final   Haemophilus influenzae NOT DETECTED NOT DETECTED Final   Neisseria meningitidis NOT DETECTED NOT DETECTED Final   Pseudomonas aeruginosa NOT DETECTED NOT DETECTED Final   Candida albicans NOT DETECTED NOT DETECTED Final   Candida glabrata NOT DETECTED NOT DETECTED Final   Candida krusei NOT DETECTED NOT DETECTED Final   Candida parapsilosis NOT DETECTED NOT DETECTED Final   Candida tropicalis NOT DETECTED NOT DETECTED Final    Comment: Performed at [facility]  Urine culture     Status: Abnormal (Preliminary result)   Collection Time: 04/07/16 12:40 AM  Result Value Ref Range Status   Specimen Description URINE, CATHETERIZED  Final   Special Requests NONE  Final   Culture >=100,000 COLONIES/mL ENTEROCOCCUS SPECIES (A)  Final   Report Status PENDING  Incomplete  MRSA PCR Screening     Status: None   Collection Time: 04/07/16  7:32 AM  Result Value Ref Range Status   MRSA by PCR NEGATIVE NEGATIVE Final    Comment:        The GeneXpert MRSA Assay (FDA approved for NASAL specimens only), is one component of a comprehensive MRSA colonization surveillance program. It is not intended to diagnose MRSA infection nor to guide or monitor treatment for MRSA infections.   Gram stain     Status: None   Collection Time: 04/08/16 10:11 AM  Result Value Ref Range Status   Specimen Description FLUID ASCITIC  Final   Special Requests NONE  Final   Gram Stain   Final    MODERATE WBC PRESENT,BOTH PMN AND MONONUCLEAR NO ORGANISMS SEEN Performed at [facility]    Report Status 04/08/2016 FINAL  Final     Studies: US Paracentesis  04/08/2016  INDICATION: Patient with history of sepsis/bacteremia, mantle cell lymphoma, ascites. Request made for diagnostic and therapeutic paracentesis. EXAM: ULTRASOUND GUIDED DIAGNOSTIC AND THERAPEUTIC PARACENTESIS MEDICATIONS: None. COMPLICATIONS: None  immediate. PROCEDURE: Informed written consent was obtained from the patient's family after a discussion of the risks, benefits and alternatives to treatment. A timeout was performed prior to the initiation of the procedure. Initial ultrasound scanning demonstrates a small amount of ascites within the right lower abdominal quadrant. The right lower abdomen was prepped and draped in the usual sterile fashion. 1% lidocaine was used for local anesthesia. Following this, a Yueh catheter was introduced. An ultrasound image was saved  for documentation purposes. The paracentesis was performed. The catheter was removed and a dressing was applied. The patient tolerated the procedure well without immediate post procedural complication. FINDINGS: A total of approximately 1.2 liters of slightly hazy, yellow fluid was removed. Samples were sent to the laboratory as requested by the clinical team. IMPRESSION: Successful ultrasound-guided diagnostic and therapeutic paracentesis yielding 1.2 liters of peritoneal fluid. Read by: Rowe Robert, PA-C Electronically Signed   By: Jacqulynn Cadet M.D.   On: 04/08/2016 11:46    Scheduled Meds: . ampicillin-sulbactam (UNASYN) IV  3 g Intravenous Q6H  . aspirin EC  81 mg Oral Daily  . carvedilol  3.125 mg Oral BID WC  . clopidogrel  75 mg Oral Daily  . feeding supplement (ENSURE ENLIVE)  237 mL Oral TID BM  . furosemide  20 mg Intravenous BID  . gabapentin  300 mg Oral BID  . guaiFENesin  600 mg Oral BID  . hydrocortisone sod succinate (SOLU-CORTEF) inj  50 mg Intravenous Q12H  . insulin aspart  0-9 Units Subcutaneous TID WC  . ipratropium  0.5 mg Nebulization BID  . isosorbide mononitrate  30 mg Oral Daily  . levalbuterol  1.25 mg Nebulization BID  . pravastatin  40 mg Oral Daily  . spironolactone  12.5 mg Oral Daily  . vancomycin  750 mg Intravenous Q12H  . vitamin B-12  1,000 mcg Oral Daily    Continuous Infusions: . phenylephrine (NEO-SYNEPHRINE) Adult infusion  Stopped (04/07/16 1800)     Time spent: 53mins  Baruch Lewers MD, PhD  [facility] Pager [phone]. If 7PM-7AM, please contact night-coverage at [email or web address], password [email or web address] 04/09/2016, 10:05 AM  LOS: 2 days

## 2016-04-09 NOTE — Progress Notes (Signed)
Hospital Problem List     Principal Problem:   SIRS (systemic inflammatory response syndrome) (HCC) Active Problems:   Essential hypertension   Mantle cell lymphoma (HCC)   S/P TAVR (transcatheter aortic valve replacement)   Type 2 diabetes mellitus with vascular disease (HCC)   Pressure ulcer   Ascites   Pyrexia   Sepsis (HCC)   Enterococcal sepsis (HCC)   Paroxysmal atrial fibrillation (HCC)   Wheezing   Diabetic foot ulcer associated with type 1 diabetes mellitus [facility])    Patient Profile:   Primary Cardiologist: Dr. Burt Knack  82 yo male w/ PMH of HTN, HLD, Asthma, Mantle Cell Lymphoma, CAD (s/p CABG 1995, recent PCI 10/2014 DES to the VG>>PDA), carotid stenosis, PVD, Type 2 DM, COPD, and aortic stenosis (s/p TAVR 2015) who presented to the [place] ED on 04/06/2016 with fever/chills and admitted for SIRS. Cards consulted for atrial fibrillation.  Subjective   Reports his breathing is similar to yesterday. Denies any chest pain or palpitations.   Inpatient Medications    . ampicillin-sulbactam (UNASYN) IV  3 g Intravenous Q6H  . aspirin EC  81 mg Oral Daily  . clopidogrel  75 mg Oral Daily  . feeding supplement (ENSURE ENLIVE)  237 mL Oral TID BM  . gabapentin  300 mg Oral BID  . guaiFENesin  600 mg Oral BID  . hydrocortisone sod succinate (SOLU-CORTEF) inj  50 mg Intravenous Q6H  . insulin aspart  0-9 Units Subcutaneous TID WC  . ipratropium  0.5 mg Nebulization BID  . isosorbide mononitrate  30 mg Oral Daily  . levalbuterol  1.25 mg Nebulization BID  . metoprolol tartrate  12.5 mg Oral BID  . pravastatin  40 mg Oral Daily  . vancomycin  750 mg Intravenous Q12H  . vitamin B-12  1,000 mcg Oral Daily   . phenylephrine (NEO-SYNEPHRINE) Adult infusion Stopped (04/07/16 1800)   Vital Signs    Filed Vitals:   04/09/16 0500 04/09/16 0600 04/09/16 0629 04/09/16 0633  BP:   154/84   Pulse: 45 137  65  Temp: 97.5 F (36.4 C) 96.4 F (35.8 C)  97.3 F (36.3 C)    TempSrc: Core (Comment) Core (Comment)  Core (Comment)  Resp: 22 23  20   Height:      Weight:      SpO2: 98% 99%  100%    Intake/Output Summary (Last 24 hours) at 04/09/16 0828 Last data filed at 04/09/16 0600  Gross per 24 hour  Intake   1120 ml  Output   1650 ml  Net   -530 ml   Filed Weights   04/07/16 0005 04/07/16 0416  Weight: 175 lb (79.379 kg) 177 lb 0.5 oz (80.3 kg)    Physical Exam    General: Elderly Caucasian  male appearing in no acute distress. Head: Normocephalic, atraumatic.  Neck: Supple without bruits, JVD elevated to 9cm. Lungs:  Resp regular and unlabored, expiratory wheezing throughout upper lung fields. Mild rales at bases bilaerally. Heart: Irregularly irregular, S1, S2, no S3, S4, crisp valve sounds present; no rub. Abdomen: Soft, non-tender, non-distended with normoactive bowel sounds. No hepatomegaly. No rebound/guarding. No obvious abdominal masses. Extremities: No clubbing, cyanosis, or edema. Distal pedal pulses are 2+ bilaterally. Neuro: Alert and oriented X 3. Moves all extremities spontaneously. Psych: Normal affect.  Labs    CBC  Recent Labs  04/07/16 0013 04/07/16 0421 04/08/16 0322 04/09/16 0338  WBC 12.9* 15.4* 9.0 11.1*  NEUTROABS 11.0* 13.1*  --   --  HGB 9.2* 9.4* 9.9* 9.6*  HCT 27.7* 28.6* 29.7* 28.4*  MCV 81.0 82.7 83.0 81.6  PLT 74* 60* 58* 59*   Basic Metabolic Panel  Recent Labs  04/07/16 1730 04/08/16 0322  NA 132* 130*  K 5.0 4.6  CL 105 104  CO2 17* 14*  GLUCOSE 117* 171*  BUN 32* 38*  CREATININE 1.23 1.27*  CALCIUM 8.0* 8.3*   Liver Function Tests  Recent Labs  04/07/16 1730 04/08/16 0322  AST 56* 62*  ALT 36 40  ALKPHOS 192* 200*  BILITOT 1.7* 1.7*  PROT 4.9*  4.8* 5.1*  ALBUMIN 2.9*  2.9* 3.0*   No results for input(s): LIPASE, AMYLASE in the last 72 hours. Cardiac Enzymes  Recent Labs  04/07/16 1013 04/07/16 1730 04/08/16 0322  TROPONINI 1.27* 1.56* 1.19*    Telemetry     Atrial fibrillation HR in 70's - 80's. Episodes of ventricular bigeminy.    ECG    No new tracings.   Cardiac Studies and Radiology     US Paracentesis: 04/08/2016  INDICATION: Patient with history of sepsis/bacteremia, mantle cell lymphoma, ascites. Request made for diagnostic and therapeutic paracentesis. EXAM: ULTRASOUND GUIDED DIAGNOSTIC AND THERAPEUTIC PARACENTESIS MEDICATIONS: None. COMPLICATIONS: None immediate. PROCEDURE: Informed written consent was obtained from the patient's family after a discussion of the risks, benefits and alternatives to treatment. A timeout was performed prior to the initiation of the procedure. Initial ultrasound scanning demonstrates a small amount of ascites within the right lower abdominal quadrant. The right lower abdomen was prepped and draped in the usual sterile fashion. 1% lidocaine was used for local anesthesia. Following this, a Yueh catheter was introduced. An ultrasound image was saved for documentation purposes. The paracentesis was performed. The catheter was removed and a dressing was applied. The patient tolerated the procedure well without immediate post procedural complication. FINDINGS: A total of approximately 1.2 liters of slightly hazy, yellow fluid was removed. Samples were sent to the laboratory as requested by the clinical team. IMPRESSION: Successful ultrasound-guided diagnostic and therapeutic paracentesis yielding 1.2 liters of peritoneal fluid. Read by: Rowe Robert, PA-C Electronically Signed   By: Jacqulynn Cadet M.D.   On: 04/08/2016 11:46   Dg Chest Port 1 View: 04/08/2016  CLINICAL DATA:  Wheezing, cough. EXAM: PORTABLE CHEST 1 VIEW COMPARISON:  Apr 06, 2016. FINDINGS: Stable cardiomegaly. Status post Coronary artery bypass graft. No pneumothorax or pleural effusion is noted. Stable interstitial densities are noted throughout both lungs most consistent with scarring. Bony thorax appears intact. IMPRESSION: Stable interstitial densities  are noted throughout both lungs most consistent with scarring. No significant changes noted compared to prior exam. Electronically Signed   By: Marijo Conception, M.D.   On: 04/08/2016 09:19    Echocardiogram: 04/08/2016 Study Conclusions - Left ventricle: LVEF is seveely depressed with dyskinetic septal  motion. The cavity size was normal. Wall thickness was normal.  Systolic function was severely reduced. The estimated ejection  fraction was in the range of 25% to 30%. - Aortic valve: Peak and mean gradients through AV prosthesis are  17 and 10 mm Hg respectively. s/p TAVR. There was no  regurgitation. - Mitral valve: Moderately calcified, moderately thickened annulus.  Mildly thickened leaflets . There was mild regurgitation. - Left atrium: The atrium was severely dilated. - Right ventricle: The cavity size was moderately dilated. Wall  thickness was normal. Systolic function was moderately reduced. - Right atrium: The atrium was severely dilated. - Tricuspid valve: There was moderate regurgitation.  Impressions: - Since echo from December 2016, LVEF is significantly depressed  Assessment & Plan    1. New Onset Atrial Fibrillation -  Initially in atrial fibrillation with RVR, started on BB with improvement of his HR in the 80's - 90's. Past EKG's have been read as atrial fibrillation but showed NSR with frequent PAC's and PVC's - This patients CHA2DS2-VASc Score and unadjusted Ischemic Stroke Rate (% per year) is equal to 9.7 % stroke rate/year from a score of 6 (CHF, HTN, DM, Vascular, Age (2)). However, given his current thrombocytopenia he is not a candidate for anticoagulation at this time. Hopefully with the resolution of his thrombocytopenia he could be started on oral anticoagulation in the future.   2. Elevated Troponin/ CAD - has a significant cardiac history with CABG in 1995, subsequent PCI to the SVG-PDA with the most recent being in 2015. Denies any recent anginal  symptoms. - troponin values this admission have been 1.29, 1.27, 1.56 and 1.19. - echo shows a reduced EF of 25-30% when compared to recent echo in 11/2015 with an EF of 50-55% at that time.  - EF could be reduced secondary to his acute illness, possible tachycardia mediated if he were having episodes of PAF prior to admission, or due to ischemia for he has known severe CAD having required multiple interventions in the past.  - as of now, he is not currently a cath candidate with his thrombocytopenia, varying hemodynamics, and decreased respiratory status. Could consider following improvement of his acute illness. - continue ASA, statin, Plavix, Imdur, and BB. No Heparin secondary to thrombocytopenia.   3. Thrombocytopenia - Platelet count down to 59 on 04/09/2016. - continue to monitor CBC. Would hope this resolves with improvement of his SIRS.   4. Severe Aortic Stenosis  - s/p TAVR in 2015.  - blood cultures this admission positive for enterococcus.  - would consider a TEE to evaluate for valvular endocarditis but he is not a good candidate for this currently with his thrombocytopenia.   5. Acute Systolic CHF - Echo this admission shows an EF of 25-30%, previously 50-55% by echo in 11/2015.  - BNP elevated to 3365. - Will switch Lopressor to Coreg (3.125mg  BID - titrate as HR and BP allows). Will start low-dose diuresis with IV Lasix 20mg  BID and Aldactone 12.5mg  daily. Monitor creatinine and potassium closely. Will add ACE-I prior to d/c if creatinine is stable.  Arna Medici , PA-C 8:28 AM 04/09/2016 Pager: [phone]   Patient seen and examined. Agree with assessment and plan. Less sob today EF reduced to 25 - 30% in setting of bacteremia/sepsis. Echo could not comment on any vegetation on AVR. Enteroccus is sensitive to ampicilllin and on therapy for potential endocarditis with couble drug therapy. Will give iv lasix and spironolactone, and BB changed to carvedilol.  Titrate meds as BP allows. Marked thrombocytopenia without overt bleeding. May need future TEE if platelets normalize to re-assess [place] 3 TAVR valve. No anginal symptoms with CAD.   Troy Sine, MD, [facility] 04/09/2016 12:23 PM

## 2016-04-09 NOTE — Progress Notes (Signed)
Inpatient Diabetes Program Recommendations  AACE/ADA: New Consensus Statement on Inpatient Glycemic Control (2015)  Target Ranges:  Prepandial:   less than 140 mg/dL      Peak postprandial:   less than 180 mg/dL (1-2 hours)      Critically ill patients:  140 - 180 mg/dL   Results for EDY, KRANER (MRN [id number]) as of 04/09/2016 08:20  Ref. Range 04/08/2016 08:30 04/08/2016 11:47 04/08/2016 16:11 04/08/2016 21:32  Glucose-Capillary Latest Ref Range: 65-99 mg/dL 196 (H) 246 (H) 220 (H) 157 (H)    Home DM Meds: Metformin 500 mg bid  Current Insulin Orders: Novolog Sensitive Correction Scale/ SSI (0-9 units) TID AC       MD- If patient is to remain on IV Solucortef, please consider increasing Novolog Correction Scale/ SSI to Moderate scale (0-15 units) TID AC + HS     --Will follow patient during hospitalization--  Wyn Quaker RN, MSN, CDE Diabetes Coordinator Inpatient Glycemic Control Team Team Pager: [phone] (8a-5p)

## 2016-04-10 DIAGNOSIS — I5021 Acute systolic (congestive) heart failure: Secondary | ICD-10-CM

## 2016-04-10 LAB — GLUCOSE, CAPILLARY
GLUCOSE-CAPILLARY: 197 mg/dL — AB (ref 65–99)
GLUCOSE-CAPILLARY: 177 mg/dL — AB (ref 65–99)
GLUCOSE-CAPILLARY: 161 mg/dL — AB (ref 65–99)
Glucose-Capillary: 175 mg/dL — ABNORMAL HIGH (ref 65–99)

## 2016-04-10 LAB — HIV-1 RNA QUANT-NO REFLEX-BLD
HIV 1 RNA Quant: <20 copies/mL
LOG10 HIV-1 RNA: UNDETERMINED log10copy/mL

## 2016-04-10 LAB — COMPREHENSIVE METABOLIC PANEL
ALBUMIN: 3.1 g/dL — AB (ref 3.5–5.0)
ALT: 49 U/L (ref 17–63)
AST: 47 U/L — AB (ref 15–41)
Alkaline Phosphatase: 172 U/L — ABNORMAL HIGH (ref 38–126)
Anion gap: 11 (ref 5–15)
BUN: 58 mg/dL — AB (ref 6–20)
CHLORIDE: 105 mmol/L (ref 101–111)
CO2: 21 mmol/L — ABNORMAL LOW (ref 22–32)
Calcium: 8.7 mg/dL — ABNORMAL LOW (ref 8.9–10.3)
Creatinine, Ser: 1.42 mg/dL — ABNORMAL HIGH (ref 0.61–1.24)
GFR calc Af Amer: 50 mL/min — ABNORMAL LOW (ref >60)
GFR calc non Af Amer: 43 mL/min — ABNORMAL LOW (ref >60)
GLUCOSE: 150 mg/dL — AB (ref 65–99)
POTASSIUM: 3.7 mmol/L (ref 3.5–5.1)
Sodium: 137 mmol/L (ref 135–145)
Total Bilirubin: 1 mg/dL (ref 0.3–1.2)
Total Protein: 4.9 g/dL — ABNORMAL LOW (ref 6.5–8.1)

## 2016-04-10 LAB — CBC
HCT: 28.2 % — ABNORMAL LOW (ref 39.0–52.0)
Hemoglobin: 9.4 g/dL — ABNORMAL LOW (ref 13.0–17.0)
MCH: 27.2 pg (ref 26.0–34.0)
MCHC: 33.3 g/dL (ref 30.0–36.0)
MCV: 81.7 fL (ref 78.0–100.0)
PLATELETS: 71 10*3/uL — AB (ref 150–400)
RBC: 3.45 MIL/uL — ABNORMAL LOW (ref 4.22–5.81)
RDW: 17.5 % — AB (ref 11.5–15.5)
WBC: 7 10*3/uL (ref 4.0–10.5)

## 2016-04-10 LAB — PROTIME-INR
INR: 1.35 (ref 0.00–1.49)
PROTHROMBIN TIME: 16.3 s — AB (ref 11.6–15.2)

## 2016-04-10 MED ORDER — ONDANSETRON HCL 4 MG/2ML IJ SOLN
4.0000 mg | Freq: Four times a day (QID) | INTRAMUSCULAR | Status: DC | PRN
  Administered 2016-04-10: 4 mg via INTRAVENOUS
  Filled 2016-04-10: qty 2

## 2016-04-10 MED ORDER — MENTHOL 3 MG MT LOZG
1.0000 | LOZENGE | OROMUCOSAL | Status: DC | PRN
  Administered 2016-04-10: 3 mg via ORAL
  Filled 2016-04-10: qty 9

## 2016-04-10 MED ORDER — PREDNISONE 50 MG PO TABS
50.0000 mg | ORAL_TABLET | Freq: Every day | ORAL | Status: DC
  Administered 2016-04-11: 50 mg via ORAL
  Filled 2016-04-10: qty 1

## 2016-04-10 MED ORDER — KETOTIFEN FUMARATE 0.025 % OP SOLN
1.0000 [drp] | Freq: Two times a day (BID) | OPHTHALMIC | Status: DC | PRN
  Administered 2016-04-10: 1 [drp] via OPHTHALMIC
  Filled 2016-04-10 (×2): qty 5

## 2016-04-10 NOTE — Progress Notes (Signed)
Patient ID: [name], male   DOB: 03-18-1927, 82 y.o.   MRN: [id number]      Subjective:    Mild improvement in SOB  Objective:   Temp:  [97.7 F (36.5 C)-97.9 F (36.6 C)] 97.8 F (36.6 C) (05/06 0436) Pulse Rate:  [37-98] 78 (05/06 0436) Resp:  [18-25] 18 (05/06 0436) BP: (115-133)/(49-86) 123/70 mmHg (05/06 0436) SpO2:  [99 %-100 %] 99 % (05/06 0750) Weight:  [190 lb 4.1 oz (86.3 kg)] 190 lb 4.1 oz (86.3 kg) (05/05 1304) Last BM Date: 04/09/16  Filed Weights   04/07/16 0005 04/07/16 0416 04/09/16 1304  Weight: 175 lb (79.379 kg) 177 lb 0.5 oz (80.3 kg) 190 lb 4.1 oz (86.3 kg)    Intake/Output Summary (Last 24 hours) at 04/10/16 0854 Last data filed at 04/10/16 0443  Gross per 24 hour  Intake     50 ml  Output   1750 ml  Net  -1700 ml    Telemetry: afib rate controlled  Exam:  General: NAD  HEENT: sclera clear  Resp: bilateral wheezing  Cardiac: irreg, 2/6 systolic murmur RUSB, elevated JVd  GI: abdomen soft, NT, ND  MSK: no LE edema  Neuro: no focal deficits  Psych: appropriate affect  Lab Results:  Basic Metabolic Panel:  Recent Labs Lab 04/07/16 1730 04/08/16 0322 04/10/16 0514  NA 132* 130* 137  K 5.0 4.6 3.7  CL 105 104 105  CO2 17* 14* 21*  GLUCOSE 117* 171* 150*  BUN 32* 38* 58*  CREATININE 1.23 1.27* 1.42*  CALCIUM 8.0* 8.3* 8.7*    Liver Function Tests:  Recent Labs Lab 04/07/16 1730 04/08/16 0322 04/10/16 0514  AST 56* 62* 47*  ALT 36 40 49  ALKPHOS 192* 200* 172*  BILITOT 1.7* 1.7* 1.0  PROT 4.9*  4.8* 5.1* 4.9*  ALBUMIN 2.9*  2.9* 3.0* 3.1*    CBC:  Recent Labs Lab 04/08/16 0322 04/09/16 0338 04/10/16 0514  WBC 9.0 11.1* 7.0  HGB 9.9* 9.6* 9.4*  HCT 29.7* 28.4* 28.2*  MCV 83.0 81.6 81.7  PLT 58* 59* 71*    Cardiac Enzymes:  Recent Labs Lab 04/07/16 1013 04/07/16 1730 04/08/16 0322  TROPONINI 1.27* 1.56* 1.19*    BNP: No results for input(s): PROBNP in the last 8760  hours.  Coagulation:  Recent Labs Lab 04/07/16 0200 04/10/16 0514  INR 1.37 1.35    ECG:   Medications:   Scheduled Medications: . ampicillin (OMNIPEN) IV  2 g Intravenous Q6H  . aspirin EC  81 mg Oral Daily  . carvedilol  3.125 mg Oral BID WC  . cefTRIAXone (ROCEPHIN)  IV  2 g Intravenous Q12H  . clopidogrel  75 mg Oral Daily  . feeding supplement (ENSURE ENLIVE)  237 mL Oral TID BM  . furosemide  20 mg Intravenous BID  . gabapentin  300 mg Oral BID  . guaiFENesin  600 mg Oral BID  . hydrocortisone sod succinate (SOLU-CORTEF) inj  50 mg Intravenous Q12H  . insulin aspart  0-9 Units Subcutaneous TID WC  . ipratropium  0.5 mg Nebulization TID  . isosorbide mononitrate  30 mg Oral Daily  . levalbuterol  1.25 mg Nebulization TID  . pravastatin  40 mg Oral Daily  . spironolactone  12.5 mg Oral Daily  . vitamin B-12  1,000 mcg Oral Daily     Infusions:     PRN Medications:  acetaminophen, hydrOXYzine, levalbuterol, nitroGLYCERIN, zolpidem     Assessment/Plan   1.  New onset afib - rate controlled with coreg - This patients CHA2DS2-VASc Score and unadjusted Ischemic Stroke Rate (% per year) is equal to 9.7 % stroke rate/year from a score of 6 (CHF, HTN, DM, Vascular, Age (2)). However, given his current thrombocytopenia he is not a candidate for anticoagulation at this time. Hopefully with the resolution of his thrombocytopenia he could be started on oral anticoagulation in the future.   2. Elevated troponin - peak of 1.56, trending down - history of prior CABG, subsequent PCI to the SVG-PDA with the most recent being in 2015. - no recent chest pain.  - this occurred in setting of enterococcus bacteremia, sepsis and transient septic shock, , and afib with RVR.  - new drop in LVEF from 50-55% 11/2015 to 20-25% this admit - medical management at this time. Would not pursue cath due to multiple comorbidities listed above as well as thrombocytopenia and mantel cell  lymphoma with CT scan showing new liver lesions. He has elected for DNR status.    3. Acute systolic HF - - new drop in LVEF from 50-55% 11/2015 to 20-25% this admit, along with moderate RV dysfunction - medical therapy with coreg, aldactone - no plans for invasive testing as described above. He had hyperkalemia previously on ARB.  - on lasix 20mg  IV bid, negative 1.7 liters yesterday. Uptrend in Cr and BUN. He has received IV lasix 20mg  x 1 this AM, will d/c and follow volume status and labs tomorrow.   4. Bacteremia - enterococcus bacteremia followed by ID - he does have history of TAVR. TTE without clear vegetation - can consider TEE Monday if ultimately recommended by ID. Currently respirotory status less than optimal for sedation, will have to weigh risks vs benefits.     Carlyle Dolly, M.D.

## 2016-04-10 NOTE — Progress Notes (Signed)
PROGRESS NOTE  [name] [id number] DOB: 1927/01/18 DOA: 04/06/2016 PCP: Donnajean Lopes, MD  HPI/Recap of past 24 hours:  Feeling better, less wheezing, bp stable  Wife and daughter in room   Assessment/Plan: Principal Problem:   SIRS (systemic inflammatory response syndrome) ([place]) Active Problems:   Essential hypertension   Mantle cell lymphoma (HCC)   S/P TAVR (transcatheter aortic valve replacement)   Type 2 diabetes mellitus with vascular disease (HCC)   Pressure ulcer   Ascites   Pyrexia   Sepsis (HCC)   Enterococcal sepsis (HCC)   Paroxysmal atrial fibrillation (HCC)   Wheezing   Diabetic foot ulcer associated with type 1 diabetes mellitus ([place])   Prosthetic valve endocarditis ([place])   Severe sepsis with septic shock ([place])  Sepsis/spetic shock with enterococcus bacteremia:  required icu consult, treated with pressor briefly on 5/3. Stress dose steroids, ivf. Broad spectrum abx. Vital stabilized on 5/4. Leukocytosis/lactic acid normalized, patient has history of TAVR, 2DEcho no vegetation seen ,  repeat blood culture to ensure clearance.  Source of bacteremia? He did complain of ab pain on presentation, US paracentesis on 5/4 with 226 wbc 72% neutrophil, gram stain no organism, culture no growth, cytology negative for malignancy .   ID consulted, input appreciated, now on amp/rocephin double coverage, will likely need total of 6 weeks to empirically cover endocarditis ( not able to perform TEE due to thrombocytopenia)  Bacteremia: was on vanc/cefepime, then unasyn, then amp/rocephin per ID recommendation.  Wheezing developed on 5/4 am, stat cxr no acute findings , does has chronic scarring, no significant sign of fluids overload, will d/c ivf, start xopenex/atrovent, trial dose of lasix Per patient he has been having chronic intermittent wheezing since his heart surgery in 1995, reported quit smoking 3yrs ago, he smoked 67yrs prior to that, never had lung  function test.  H/o CAD s/p CABG with stenting: patient does has Elevated troponin in the setting of sepsis, denies chest pain, no acute chest pain, he was started on heparin drip briefly in the ED, this was stopped later due to thrombocytopenia, echo with reduced ef, he is continued on plavix/statin. Cardiology consulted  Afib: new diagnosis. CHADSvasc score 4 with dm2, age, htn. But he had thrombocytopenia, started lopressor , now on coreg,  cardiology consulted. meds adjusted.  Thrombocytopenia, hopefully this will recover with treatment of sepsis. He does has splenomegaly. plt seems trending up  noninsulin dependent dm2, metformin held, on ssi here  HTN: bp med held on 5/3 due to septic shock, bp stabilized off pressor on 5/4. Restart imdur, start low dose lopressor, now on coreg  H/o mantle cell lymphoma: CT scan with new liver lesions, splenomegaly, small pleural effusion, small ascites, US guided paracentesis with 1.2liter of slightly hazy, yellow fluids, no malignant cells by pathology report, oncology input appreciated.    Code Status: DNR  Family Communication: patient , wife and daughter on 5/6  Disposition Plan: transferred out of stepdown, TEE? Picc line?  Will get Pt/OT, likely will need SNF at discharge, possible early next week if no TEE planned.   Consultants:  pccm  Oncology  Cardiology  ID  Procedures:  US guided paracentesis on 5/4  Antibiotics:  Vanc/cefepime on 5/3  unasyn on 5/4---5/5  Amp/rocephin from 5/5   Objective: BP 123/70 mmHg  Pulse 78  Temp(Src) 97.8 F (36.6 C) (Oral)  Resp 18  Ht 6' (1.829 m)  Wt 86.3 kg (190 lb 4.1 oz)  BMI 25.80 kg/m2  SpO2  99%  Intake/Output Summary (Last 24 hours) at 04/10/16 1404 Last data filed at 04/10/16 1300  Gross per 24 hour  Intake    530 ml  Output   2450 ml  Net  -1920 ml   Filed Weights   04/07/16 0005 04/07/16 0416 04/09/16 1304  Weight: 79.379 kg (175 lb) 80.3 kg (177 lb 0.5 oz) 86.3  kg (190 lb 4.1 oz)    Exam:   General:  frail  Cardiovascular: IRRR  Respiratory: bilateral wheezing  Abdomen: Soft/ND/NT, positive BS  Musculoskeletal: No Edema, 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 Likely baseline dementia  Data Reviewed: Basic Metabolic Panel:  Recent Labs Lab 04/07/16 0013 04/07/16 0421 04/07/16 1730 04/08/16 0322 04/10/16 0514  NA 129* 130* 132* 130* 137  K 4.3 4.4 5.0 4.6 3.7  CL 97* 102 105 104 105  CO2 21* 18* 17* 14* 21*  GLUCOSE 188* 195* 117* 171* 150*  BUN 27* 28* 32* 38* 58*  CREATININE 1.21 1.15 1.23 1.27* 1.42*  CALCIUM 9.4 8.6* 8.0* 8.3* 8.7*   Liver Function Tests:  Recent Labs Lab 04/07/16 0013 04/07/16 0421 04/07/16 1730 04/08/16 0322 04/10/16 0514  AST 50* 73* 56* 62* 47*  ALT 31 37 36 40 49  ALKPHOS 221* 220* 192* 200* 172*  BILITOT 1.9* 2.2* 1.7* 1.7* 1.0  PROT 5.7* 5.1* 4.9*  4.8* 5.1* 4.9*  ALBUMIN 3.6 3.2* 2.9*  2.9* 3.0* 3.1*   No results for input(s): LIPASE, AMYLASE in the last 168 hours. No results for input(s): AMMONIA in the last 168 hours. CBC:  Recent Labs Lab 04/07/16 0013 04/07/16 0421 04/08/16 0322 04/09/16 0338 04/10/16 0514  WBC 12.9* 15.4* 9.0 11.1* 7.0  NEUTROABS 11.0* 13.1*  --   --   --   HGB 9.2* 9.4* 9.9* 9.6* 9.4*  HCT 27.7* 28.6* 29.7* 28.4* 28.2*  MCV 81.0 82.7 83.0 81.6 81.7  PLT 74* 60* 58* 59* 71*   Cardiac Enzymes:    Recent Labs Lab 04/07/16 0421 04/07/16 1013 04/07/16 1730 04/08/16 0322  TROPONINI 1.29* 1.27* 1.56* 1.19*   BNP (last 3 results)  Recent Labs  03/12/16 1856 04/08/16 1619  BNP 2301.3* 3365.9*    ProBNP (last 3 results) No results for input(s): PROBNP in the last 8760 hours.  CBG:  Recent Labs Lab 04/09/16 1208 04/09/16 1653 04/09/16 2227 04/10/16 0739 04/10/16 1134  GLUCAP 252* 176* 149* 175* 197*    Recent Results (from the past 240 hour(s))  Blood Culture (routine x 2)      Status: Abnormal   Collection Time: 04/07/16 12:13 AM  Result Value Ref Range Status   Specimen Description BLOOD RIGHT ANTECUBITAL  Final   Special Requests BOTTLES DRAWN AEROBIC AND ANAEROBIC 5CC  Final   Culture  Setup Time   Final    GRAM POSITIVE COCCI IN CHAINS IN PAIRS IN BOTH AEROBIC AND ANAEROBIC BOTTLES Organism ID to follow CRITICAL RESULT CALLED TO, READ BACK BY AND VERIFIED WITHUlice Bold [facility] 2027 04/07/16 A BROWNING Performed at [facility]    Culture ENTEROCOCCUS SPECIES (A)  Final   Report Status 04/09/2016 FINAL  Final   Organism ID, Bacteria ENTEROCOCCUS SPECIES  Final      Susceptibility   Enterococcus species - MIC*    AMPICILLIN <=2 SENSITIVE Sensitive     VANCOMYCIN 1 SENSITIVE Sensitive     GENTAMICIN SYNERGY RESISTANT Resistant     * ENTEROCOCCUS SPECIES  Blood Culture (routine x 2)     Status: Abnormal   Collection Time: 04/07/16 12:13 AM  Result Value Ref Range Status   Specimen Description BLOOD LEFT HAND  Final   Special Requests BOTTLES DRAWN AEROBIC AND ANAEROBIC 4CC  Final   Culture  Setup Time   Final    GRAM POSITIVE COCCI IN CHAINS IN PAIRS IN BOTH AEROBIC AND ANAEROBIC BOTTLES CRITICAL RESULT CALLED TO, READ BACK BY AND VERIFIED WITH: J. GADHIA, PHARM D AT 1000 ON [id number] BY Rhea Bleacher    Culture (A)  Final    ENTEROCOCCUS SPECIES SUSCEPTIBILITIES PERFORMED ON PREVIOUS CULTURE WITHIN THE LAST 5 DAYS. Performed at [facility]    Report Status 04/09/2016 FINAL  Final  Blood Culture ID Panel (Reflexed)     Status: Abnormal   Collection Time: 04/07/16 12:13 AM  Result Value Ref Range Status   Enterococcus species DETECTED (A) NOT DETECTED Final    Comment: CRITICAL RESULT CALLED TO, READ BACK BY AND VERIFIED WITH: C SHADE PHARMD 2027 04/07/16 A BROWNING    Vancomycin resistance NOT DETECTED NOT DETECTED Final   Listeria monocytogenes NOT DETECTED NOT DETECTED Final   Staphylococcus species NOT DETECTED NOT DETECTED Final    Staphylococcus aureus NOT DETECTED NOT DETECTED Final   Methicillin resistance NOT DETECTED NOT DETECTED Final   Streptococcus species NOT DETECTED NOT DETECTED Final   Streptococcus agalactiae NOT DETECTED NOT DETECTED Final   Streptococcus pneumoniae NOT DETECTED NOT DETECTED Final   Streptococcus pyogenes NOT DETECTED NOT DETECTED Final   Acinetobacter baumannii NOT DETECTED NOT DETECTED Final   Enterobacteriaceae species NOT DETECTED NOT DETECTED Final   Enterobacter cloacae complex NOT DETECTED NOT DETECTED Final   Escherichia coli NOT DETECTED NOT DETECTED Final   Klebsiella oxytoca NOT DETECTED NOT DETECTED Final   Klebsiella pneumoniae NOT DETECTED NOT DETECTED Final   Proteus species NOT DETECTED NOT DETECTED Final   Serratia marcescens NOT DETECTED NOT DETECTED Final   Carbapenem resistance NOT DETECTED NOT DETECTED Final   Haemophilus influenzae NOT DETECTED NOT DETECTED Final   Neisseria meningitidis NOT DETECTED NOT DETECTED Final   Pseudomonas aeruginosa NOT DETECTED NOT DETECTED Final   Candida albicans NOT DETECTED NOT DETECTED Final   Candida glabrata NOT DETECTED NOT DETECTED Final   Candida krusei NOT DETECTED NOT DETECTED Final   Candida parapsilosis NOT DETECTED NOT DETECTED Final   Candida tropicalis NOT DETECTED NOT DETECTED Final    Comment: Performed at [facility]  Urine culture     Status: Abnormal   Collection Time: 04/07/16 12:40 AM  Result Value Ref Range Status   Specimen Description URINE, CATHETERIZED  Final   Special Requests NONE  Final   Culture >=100,000 COLONIES/mL ENTEROCOCCUS SPECIES (A)  Final   Report Status 04/09/2016 FINAL  Final   Organism ID, Bacteria ENTEROCOCCUS SPECIES (A)  Final      Susceptibility   Enterococcus species - MIC*    AMPICILLIN <=2 SENSITIVE Sensitive     LEVOFLOXACIN >=8 RESISTANT Resistant     NITROFURANTOIN <=16 SENSITIVE Sensitive     VANCOMYCIN 1 SENSITIVE Sensitive     * >=100,000 COLONIES/mL  ENTEROCOCCUS SPECIES  MRSA PCR Screening     Status: None   Collection Time: 04/07/16  7:32 AM  Result Value Ref Range Status   MRSA by PCR NEGATIVE NEGATIVE Final    Comment:        The GeneXpert MRSA Assay (FDA approved for NASAL specimens only), is  one component of a comprehensive MRSA colonization surveillance program. It is not intended to diagnose MRSA infection nor to guide or monitor treatment for MRSA infections.   Culture, body fluid-bottle     Status: None (Preliminary result)   Collection Time: 04/08/16 10:11 AM  Result Value Ref Range Status   Specimen Description FLUID ASCITIC  Final   Special Requests NONE  Final   Culture   Final    NO GROWTH 1 DAY Performed at [facility]    Report Status PENDING  Incomplete  Gram stain     Status: None   Collection Time: 04/08/16 10:11 AM  Result Value Ref Range Status   Specimen Description FLUID ASCITIC  Final   Special Requests NONE  Final   Gram Stain   Final    MODERATE WBC PRESENT,BOTH PMN AND MONONUCLEAR NO ORGANISMS SEEN Performed at [facility]    Report Status 04/08/2016 FINAL  Final     Studies: No results found.  Scheduled Meds: . ampicillin (OMNIPEN) IV  2 g Intravenous Q6H  . aspirin EC  81 mg Oral Daily  . carvedilol  3.125 mg Oral BID WC  . cefTRIAXone (ROCEPHIN)  IV  2 g Intravenous Q12H  . clopidogrel  75 mg Oral Daily  . feeding supplement (ENSURE ENLIVE)  237 mL Oral TID BM  . gabapentin  300 mg Oral BID  . guaiFENesin  600 mg Oral BID  . hydrocortisone sod succinate (SOLU-CORTEF) inj  50 mg Intravenous Q12H  . insulin aspart  0-9 Units Subcutaneous TID WC  . ipratropium  0.5 mg Nebulization TID  . isosorbide mononitrate  30 mg Oral Daily  . levalbuterol  1.25 mg Nebulization TID  . pravastatin  40 mg Oral Daily  . spironolactone  12.5 mg Oral Daily  . vitamin B-12  1,000 mcg Oral Daily    Continuous Infusions:     Time spent: 54mins  Sanayah Munro MD, PhD  [facility] Pager [phone]. If 7PM-7AM, please contact night-coverage at [email or web address], password [facility] 04/10/2016, 2:04 PM  LOS: 3 days

## 2016-04-11 DIAGNOSIS — E119 Type 2 diabetes mellitus without complications: Secondary | ICD-10-CM

## 2016-04-11 LAB — COMPREHENSIVE METABOLIC PANEL
ALBUMIN: 3.1 g/dL — AB (ref 3.5–5.0)
ALK PHOS: 160 U/L — AB (ref 38–126)
ALT: 48 U/L (ref 17–63)
AST: 45 U/L — AB (ref 15–41)
Anion gap: 9 (ref 5–15)
BILIRUBIN TOTAL: 0.9 mg/dL (ref 0.3–1.2)
BUN: 55 mg/dL — AB (ref 6–20)
CO2: 25 mmol/L (ref 22–32)
CREATININE: 1.25 mg/dL — AB (ref 0.61–1.24)
Calcium: 8.8 mg/dL — ABNORMAL LOW (ref 8.9–10.3)
Chloride: 104 mmol/L (ref 101–111)
GFR calc Af Amer: 58 mL/min — ABNORMAL LOW (ref >60)
GFR calc non Af Amer: 50 mL/min — ABNORMAL LOW (ref >60)
GLUCOSE: 133 mg/dL — AB (ref 65–99)
POTASSIUM: 3.3 mmol/L — AB (ref 3.5–5.1)
Sodium: 138 mmol/L (ref 135–145)
TOTAL PROTEIN: 5.1 g/dL — AB (ref 6.5–8.1)

## 2016-04-11 LAB — GLUCOSE, CAPILLARY
GLUCOSE-CAPILLARY: 186 mg/dL — AB (ref 65–99)
Glucose-Capillary: 147 mg/dL — ABNORMAL HIGH (ref 65–99)
Glucose-Capillary: 166 mg/dL — ABNORMAL HIGH (ref 65–99)
Glucose-Capillary: 172 mg/dL — ABNORMAL HIGH (ref 65–99)

## 2016-04-11 LAB — PROCALCITONIN: Procalcitonin: 0.44 ng/mL

## 2016-04-11 MED ORDER — FAMOTIDINE 20 MG PO TABS
20.0000 mg | ORAL_TABLET | Freq: Two times a day (BID) | ORAL | Status: DC
  Administered 2016-04-11 – 2016-04-13 (×4): 20 mg via ORAL
  Filled 2016-04-11 (×5): qty 1

## 2016-04-11 MED ORDER — METHYLPREDNISOLONE SODIUM SUCC 125 MG IJ SOLR
60.0000 mg | Freq: Three times a day (TID) | INTRAMUSCULAR | Status: DC
  Administered 2016-04-11 – 2016-04-13 (×6): 60 mg via INTRAVENOUS
  Filled 2016-04-11 (×8): qty 0.96

## 2016-04-11 MED ORDER — POTASSIUM CHLORIDE CRYS ER 20 MEQ PO TBCR
40.0000 meq | EXTENDED_RELEASE_TABLET | Freq: Once | ORAL | Status: AC
  Administered 2016-04-11: 40 meq via ORAL
  Filled 2016-04-11: qty 2

## 2016-04-11 NOTE — Evaluation (Signed)
Physical Therapy Evaluation Patient Details Name: [name] MRN: [id number] DOB: Jun 16, 1927 Today's Date: 04/11/2016   History of Present Illness  82 y.o. male with medical history significant of with history of TAVR, CAD status post CABG and PCI, Mantle cell lymphoma in remission, hyperlipidemia, L foot transmet amputation was brought to the ER patient had fever and chills. Dx: bacteremia, ? endocarditis,  acute HF, s/p paracentesis 04/08/16.   Clinical Impression  Pt admitted with above diagnosis. Pt currently with functional limitations due to the deficits listed below (see PT Problem List). Pt ambulated 69' with RW and min/guard assist. SaO2 93-98% on RA with ambulation despite significant wheezing, wheezing much more pronounced in supine.  He fatigues quickly and is at risk for falls. ST-SNF recommended, pt agreeable, prefers [facility].  Pt will benefit from skilled PT to increase their independence and safety with mobility to allow discharge to the venue listed below.       Follow Up Recommendations SNF    Equipment Recommendations  None recommended by PT    Recommendations for Other Services       Precautions / Restrictions Precautions Precautions: Fall Restrictions Weight Bearing Restrictions: No      Mobility  Bed Mobility Overal bed mobility: Needs Assistance Bed Mobility: Supine to Sit     Supine to sit: Mod assist     General bed mobility comments: mod A to raise trunk and use of pad to pivot hips to EOB  Transfers Overall transfer level: Needs assistance Equipment used: Rolling walker (2 wheeled) Transfers: Sit to/from Stand Sit to Stand: From elevated surface;Max assist         General transfer comment: Verbal cues for hand placement, max A to rise (pt 50%)  Ambulation/Gait Ambulation/Gait assistance: Min guard Ambulation Distance (Feet): 65 Feet Assistive device: Rolling walker (2 wheeled) Gait Pattern/deviations: Decreased step length -  right;Decreased step length - left;Step-through pattern;Decreased stride length   Gait velocity interpretation: Below normal speed for age/gender General Gait Details: decr velocity (pt reports he normally walks faster), decr step length B, SaO2 93-97% on RA with ambulation,  pronounced wheezing noted in supine much less so in standing  Stairs            Wheelchair Mobility    Modified Rankin (Stroke Patients Only)       Balance Overall balance assessment: Needs assistance;History of Falls   Sitting balance-Leahy Scale: Good     Standing balance support: Single extremity supported Standing balance-Leahy Scale: Poor                               Pertinent Vitals/Pain Pain Assessment: No/denies pain    Home Living Family/patient expects to be discharged to:: Private residence Living Arrangements: Spouse/significant other Available Help at Discharge: Family Type of Home: House Home Access: Stairs to enter   Technical brewer of Steps: 2 Home Layout: One level Home Equipment: Cane - single point;Shower seat;Grab bars - tub/shower;Walker - 2 wheels      Prior Function Level of Independence: Independent with assistive device(s)         Comments: is supposed to use a cane but often forgets     Hand Dominance   Dominant Hand: Right    Extremity/Trunk Assessment   Upper Extremity Assessment: Overall WFL for tasks assessed           Lower Extremity Assessment: Overall WFL for tasks assessed (knee ext +4/5 B)  Cervical / Trunk Assessment: Kyphotic (forward head)  Communication   Communication: No difficulties  Cognition Arousal/Alertness: Awake/alert Behavior During Therapy: WFL for tasks assessed/performed Overall Cognitive Status: Within Functional Limits for tasks assessed                      General Comments      Exercises        Assessment/Plan    PT Assessment Patient needs continued PT services  PT  Diagnosis Difficulty walking;Generalized weakness   PT Problem List Decreased strength;Decreased activity tolerance;Decreased balance;Decreased mobility  PT Treatment Interventions Gait training;DME instruction;Functional mobility training;Therapeutic activities;Therapeutic exercise;Patient/family education;Balance training   PT Goals (Current goals can be found in the Care Plan section) Acute Rehab PT Goals Patient Stated Goal: to get strong enough to go home, pt agreeable to ST-SNF PT Goal Formulation: With patient/family Time For Goal Achievement: 04/25/16 Potential to Achieve Goals: Good    Frequency Min 3X/week   Barriers to discharge        Co-evaluation               End of Session Equipment Utilized During Treatment: Gait belt Activity Tolerance: Patient limited by fatigue Patient left: in chair;with call bell/phone within reach;with chair alarm set;with family/visitor present Nurse Communication: Mobility status         Time: [id number] PT Time Calculation (min) (ACUTE ONLY): 29 min   Charges:   PT Evaluation $PT Eval Low Complexity: 1 Procedure PT Treatments $Gait Training: 8-22 mins   PT G Codes:        Philomena Doheny 04/11/2016, 11:27 AM [phone]

## 2016-04-11 NOTE — Progress Notes (Signed)
Patient ID: SMITH MATTIA, male   DOB: 07-30-1927, 82 y.o.   MRN: [id number]     Subjective:   Still with SOB   Objective:   Temp:  [97.6 F (36.4 C)-97.7 F (36.5 C)] 97.6 F (36.4 C) (05/07 0443) Pulse Rate:  [68-84] 84 (05/07 0443) Resp:  [20] 20 (05/07 0443) BP: (127-143)/(72-82) 143/81 mmHg (05/07 0443) SpO2:  [96 %-100 %] 100 % (05/07 0741) Last BM Date: 04/10/16  Filed Weights   04/07/16 0005 04/07/16 0416 04/09/16 1304  Weight: 175 lb (79.379 kg) 177 lb 0.5 oz (80.3 kg) 190 lb 4.1 oz (86.3 kg)    Intake/Output Summary (Last 24 hours) at 04/11/16 0858 Last data filed at 04/11/16 0842  Gross per 24 hour  Intake    820 ml  Output   2275 ml  Net  -1455 ml    Telemetry: afib rate controlled  Exam:  General: NAD  HEENT: sclera clear, throat clear  Resp: bilateral expiratory wheezing.   Cardiac: irreg, no m/r/g, no jvd  GI: abdomen soft, NT, ND  MSK: no LE edema  Neuro: no focal deficits  Psych: appropriate affect  Lab Results:  Basic Metabolic Panel:  Recent Labs Lab 04/08/16 0322 04/10/16 0514 04/11/16 0509  NA 130* 137 138  K 4.6 3.7 3.3*  CL 104 105 104  CO2 14* 21* 25  GLUCOSE 171* 150* 133*  BUN 38* 58* 55*  CREATININE 1.27* 1.42* 1.25*  CALCIUM 8.3* 8.7* 8.8*    Liver Function Tests:  Recent Labs Lab 04/08/16 0322 04/10/16 0514 04/11/16 0509  AST 62* 47* 45*  ALT 40 49 48  ALKPHOS 200* 172* 160*  BILITOT 1.7* 1.0 0.9  PROT 5.1* 4.9* 5.1*  ALBUMIN 3.0* 3.1* 3.1*    CBC:  Recent Labs Lab 04/08/16 0322 04/09/16 0338 04/10/16 0514  WBC 9.0 11.1* 7.0  HGB 9.9* 9.6* 9.4*  HCT 29.7* 28.4* 28.2*  MCV 83.0 81.6 81.7  PLT 58* 59* 71*    Cardiac Enzymes:  Recent Labs Lab 04/07/16 1013 04/07/16 1730 04/08/16 0322  TROPONINI 1.27* 1.56* 1.19*    BNP: No results for input(s): PROBNP in the last 8760 hours.  Coagulation:  Recent Labs Lab 04/07/16 0200 04/10/16 0514  INR 1.37 1.35     ECG:   Medications:   Scheduled Medications: . ampicillin (OMNIPEN) IV  2 g Intravenous Q6H  . aspirin EC  81 mg Oral Daily  . carvedilol  3.125 mg Oral BID WC  . cefTRIAXone (ROCEPHIN)  IV  2 g Intravenous Q12H  . clopidogrel  75 mg Oral Daily  . feeding supplement (ENSURE ENLIVE)  237 mL Oral TID BM  . gabapentin  300 mg Oral BID  . guaiFENesin  600 mg Oral BID  . insulin aspart  0-9 Units Subcutaneous TID WC  . ipratropium  0.5 mg Nebulization TID  . isosorbide mononitrate  30 mg Oral Daily  . levalbuterol  1.25 mg Nebulization TID  . pravastatin  40 mg Oral Daily  . predniSONE  50 mg Oral Q breakfast  . spironolactone  12.5 mg Oral Daily  . vitamin B-12  1,000 mcg Oral Daily     Infusions:     PRN Medications:  acetaminophen, hydrOXYzine, ketotifen, levalbuterol, menthol-cetylpyridinium, nitroGLYCERIN, ondansetron (ZOFRAN) IV, zolpidem     Assessment/Plan    1. New onset afib - rate controlled with coreg - This patients CHA2DS2-VASc Score and unadjusted Ischemic Stroke Rate (% per year) is equal to 9.7 %  stroke rate/year from a score of 6 (CHF, HTN, DM, Vascular, Age (2)). However, given his current thrombocytopenia he is not a candidate for anticoagulation at this time. Hopefully with the resolution of his thrombocytopenia he could be started on oral anticoagulation in the future.   2. Elevated troponin - peak of 1.56, trending down - history of prior CABG, subsequent PCI to the SVG-PDA with the most recent being in 2015. - no recent chest pain.  - this occurred in setting of enterococcus bacteremia, sepsis and transient septic shock, , and afib with RVR.  - new drop in LVEF from 50-55% 11/2015 to 20-25% this admit - medical management at this time. Would not pursue cath due to multiple comorbidities listed above as well as thrombocytopenia and mantel cell lymphoma with CT scan showing new liver lesions. He has elected for DNR status.    3. Acute  systolic HF - - new drop in LVEF from 50-55% 11/2015 to 20-25% this admit, along with moderate RV dysfunction - medical therapy with coreg, aldactone - no plans for invasive testing as described above. He had hyperkalemia previously on ARB.  - on lasix 20mg  IV bid, negative 1.1 liters yesterday after receiving lasix 20mg  IV x 1. Negative 1.6 liters since admission. Uptrend in BUN continues, Cr mildly decreased from yesterday. Will hold diuretics. Appears euvolemic on exam. Ongoing SOB likely related to bronchospasm given significant bilateral expiratory wheezing on exam.    4. Bacteremia - enterococcus bacteremia followed by ID - he does have history of TAVR. TTE without clear vegetation - can consider TEE Monday if ultimately recommended by ID. Currently respirotory status less than optimal for sedation, will have to weigh risks vs benefits. F/u repeat cultures.   5. Ascites - paracentesis 04/08/16 with 1.2 liters removed.      Carlyle Dolly, M.D.,

## 2016-04-11 NOTE — Progress Notes (Signed)
PROGRESS NOTE  [name] [id number] DOB: 09-24-1927 DOA: 04/06/2016 PCP: Donnajean Lopes, MD  HPI/Recap of past 24 hours:  Feeling better, remain wheezing, bp stable  Wife and daughter in room   Assessment/Plan: Principal Problem:   SIRS (systemic inflammatory response syndrome) ([place]) Active Problems:   Essential hypertension   Mantle cell lymphoma (HCC)   S/P TAVR (transcatheter aortic valve replacement)   Type 2 diabetes mellitus with vascular disease (HCC)   Pressure ulcer   Ascites   Pyrexia   Sepsis (HCC)   Enterococcal sepsis (HCC)   Paroxysmal atrial fibrillation (HCC)   Wheezing   Diabetic foot ulcer associated with type 1 diabetes mellitus ([place])   Prosthetic valve endocarditis ([place])   Severe sepsis with septic shock ([place])  Sepsis/spetic shock with enterococcus bacteremia/enterococcus UTI:  required icu consult, treated with pressor briefly on 5/3. Stress dose steroids, ivf. Broad spectrum abx.  Vital stabilized on 5/4. Leukocytosis/lactic acid normalized, patient has history of TAVR, 2DEcho no vegetation seen ,  repeat blood culture no growth.  Source of bacteremia? Likely from UTI,   He did have ascites, complain of ab pain on presentation, US paracentesis on 5/4 with 226 wbc 72% neutrophil, gram stain no organism, culture no growth, cytology negative for malignancy.   ID consulted, input appreciated, now on amp/rocephin double coverage, will likely need total of 6 weeks to empirically cover endocarditis ( has not able to perform TEE due to thrombocytopenia)  Enterococcus Bacteremia: was on vanc/cefepime, then unasyn, then amp/rocephin per ID recommendation.  Wheezing developed on 5/4 am, stat cxr no acute findings , does has chronic scarring, no significant sign of fluids overload, will d/c ivf, start xopenex/atrovent, trial dose of lasix Per patient he has been having chronic intermittent wheezing since his heart surgery in 1995, reported quit smoking  34yrs ago, he smoked 41yrs prior to that, never had lung function test. Persistent wheezing, will increase steroids to solumedrol 60tid, continue nebs/mucinex/abx  H/o CAD s/p CABG with stenting: patient does has Elevated troponin in the setting of sepsis, denies chest pain, no acute chest pain, he was started on heparin drip briefly in the ED, this was stopped later due to thrombocytopenia, echo with reduced ef, he is continued on plavix/statin. Cardiology consulted  Afib: new diagnosis. CHADSvasc score 4 with dm2, age, htn. But he had thrombocytopenia, started lopressor , now on coreg,  cardiology consulted. meds adjusted.  Thrombocytopenia, hopefully this will recover with treatment of sepsis. He does has splenomegaly. plt seems trending up  noninsulin dependent dm2, a1c 6.3. metformin held, on ssi here  HTN: bp med held on 5/3 due to septic shock, bp stabilized off pressor on 5/4. Restart imdur, start low dose lopressor, now on coreg  H/o mantle cell lymphoma: CT scan with new liver lesions, splenomegaly, small pleural effusion, small ascites, US guided paracentesis with 1.2liter of slightly hazy, yellow fluids, no malignant cells by pathology report, oncology input appreciated.  FTT: family report patient is weaker than usual. PT/OT, likely SNF  Code Status: DNR  Family Communication: patient , wife and daughter on 5/6, 5/7  Disposition Plan:  TEE? Pic[facility] line?   SNF at discharge, possible early next week if no TEE planned.   Consultants:  pccm  Oncology  Cardiology  ID  Procedures:  US guided paracentesis on 5/4  Antibiotics:  Vanc/cefepime on 5/3  unasyn on 5/4---5/5  Amp/rocephin from 5/5   Objective: BP 143/81 mmHg  Pulse 84  Temp(Src) 97.6 F (36.4  C) (Oral)  Resp 20  Ht 6' (1.829 m)  Wt 86.3 kg (190 lb 4.1 oz)  BMI 25.80 kg/m2  SpO2 98%  Intake/Output Summary (Last 24 hours) at 04/11/16 1430 Last data filed at 04/11/16 1322  Gross per 24 hour    Intake    720 ml  Output   1075 ml  Net   -355 ml   Filed Weights   04/07/16 0005 04/07/16 0416 04/09/16 1304  Weight: 79.379 kg (175 lb) 80.3 kg (177 lb 0.5 oz) 86.3 kg (190 lb 4.1 oz)    Exam:   General:  frail  Cardiovascular: IRRR  Respiratory: bilateral wheezing  Abdomen: Soft/ND/NT, positive BS  Musculoskeletal: No Edema, 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 Likely baseline dementia  Data Reviewed: Basic Metabolic Panel:  Recent Labs Lab 04/07/16 0421 04/07/16 1730 04/08/16 0322 04/10/16 0514 04/11/16 0509  NA 130* 132* 130* 137 138  K 4.4 5.0 4.6 3.7 3.3*  CL 102 105 104 105 104  CO2 18* 17* 14* 21* 25  GLUCOSE 195* 117* 171* 150* 133*  BUN 28* 32* 38* 58* 55*  CREATININE 1.15 1.23 1.27* 1.42* 1.25*  CALCIUM 8.6* 8.0* 8.3* 8.7* 8.8*   Liver Function Tests:  Recent Labs Lab 04/07/16 0421 04/07/16 1730 04/08/16 0322 04/10/16 0514 04/11/16 0509  AST 73* 56* 62* 47* 45*  ALT 37 36 40 49 48  ALKPHOS 220* 192* 200* 172* 160*  BILITOT 2.2* 1.7* 1.7* 1.0 0.9  PROT 5.1* 4.9*  4.8* 5.1* 4.9* 5.1*  ALBUMIN 3.2* 2.9*  2.9* 3.0* 3.1* 3.1*   No results for input(s): LIPASE, AMYLASE in the last 168 hours. No results for input(s): AMMONIA in the last 168 hours. CBC:  Recent Labs Lab 04/07/16 0013 04/07/16 0421 04/08/16 0322 04/09/16 0338 04/10/16 0514  WBC 12.9* 15.4* 9.0 11.1* 7.0  NEUTROABS 11.0* 13.1*  --   --   --   HGB 9.2* 9.4* 9.9* 9.6* 9.4*  HCT 27.7* 28.6* 29.7* 28.4* 28.2*  MCV 81.0 82.7 83.0 81.6 81.7  PLT 74* 60* 58* 59* 71*   Cardiac Enzymes:    Recent Labs Lab 04/07/16 0421 04/07/16 1013 04/07/16 1730 04/08/16 0322  TROPONINI 1.29* 1.27* 1.56* 1.19*   BNP (last 3 results)  Recent Labs  03/12/16 1856 04/08/16 1619  BNP 2301.3* 3365.9*    ProBNP (last 3 results) No results for input(s): PROBNP in the last 8760 hours.  CBG:  Recent Labs Lab 04/10/16 1134  04/10/16 1620 04/10/16 2211 04/11/16 0734 04/11/16 1137  GLUCAP 197* 177* 161* 147* 166*    Recent Results (from the past 240 hour(s))  Blood Culture (routine x 2)     Status: Abnormal   Collection Time: 04/07/16 12:13 AM  Result Value Ref Range Status   Specimen Description BLOOD RIGHT ANTECUBITAL  Final   Special Requests BOTTLES DRAWN AEROBIC AND ANAEROBIC 5CC  Final   Culture  Setup Time   Final    GRAM POSITIVE COCCI IN CHAINS IN PAIRS IN BOTH AEROBIC AND ANAEROBIC BOTTLES Organism ID to follow CRITICAL RESULT CALLED TO, READ BACK BY AND VERIFIED WITHUlice Bold [facility] 2027 04/07/16 A BROWNING Performed at [place] (A)  Final   Report Status 04/09/2016 FINAL  Final   Organism ID, Bacteria ENTEROCOCCUS SPECIES  Final      Susceptibility   Enterococcus species - MIC*    AMPICILLIN <=  2 SENSITIVE Sensitive     VANCOMYCIN 1 SENSITIVE Sensitive     GENTAMICIN SYNERGY RESISTANT Resistant     * ENTEROCOCCUS SPECIES  Blood Culture (routine x 2)     Status: Abnormal   Collection Time: 04/07/16 12:13 AM  Result Value Ref Range Status   Specimen Description BLOOD LEFT HAND  Final   Special Requests BOTTLES DRAWN AEROBIC AND ANAEROBIC 4CC  Final   Culture  Setup Time   Final    GRAM POSITIVE COCCI IN CHAINS IN PAIRS IN BOTH AEROBIC AND ANAEROBIC BOTTLES CRITICAL RESULT CALLED TO, READ BACK BY AND VERIFIED WITH: J. GADHIA, PHARM D AT 1000 ON [id number] BY Rhea Bleacher    Culture (A)  Final    ENTEROCOCCUS SPECIES SUSCEPTIBILITIES PERFORMED ON PREVIOUS CULTURE WITHIN THE LAST 5 DAYS. Performed at [facility]    Report Status 04/09/2016 FINAL  Final  Blood Culture ID Panel (Reflexed)     Status: Abnormal   Collection Time: 04/07/16 12:13 AM  Result Value Ref Range Status   Enterococcus species DETECTED (A) NOT DETECTED Final    Comment: CRITICAL RESULT CALLED TO, READ BACK BY AND VERIFIED WITH: C SHADE PHARMD 2027 04/07/16 A BROWNING      Vancomycin resistance NOT DETECTED NOT DETECTED Final   Listeria monocytogenes NOT DETECTED NOT DETECTED Final   Staphylococcus species NOT DETECTED NOT DETECTED Final   Staphylococcus aureus NOT DETECTED NOT DETECTED Final   Methicillin resistance NOT DETECTED NOT DETECTED Final   Streptococcus species NOT DETECTED NOT DETECTED Final   Streptococcus agalactiae NOT DETECTED NOT DETECTED Final   Streptococcus pneumoniae NOT DETECTED NOT DETECTED Final   Streptococcus pyogenes NOT DETECTED NOT DETECTED Final   Acinetobacter baumannii NOT DETECTED NOT DETECTED Final   Enterobacteriaceae species NOT DETECTED NOT DETECTED Final   Enterobacter cloacae complex NOT DETECTED NOT DETECTED Final   Escherichia coli NOT DETECTED NOT DETECTED Final   Klebsiella oxytoca NOT DETECTED NOT DETECTED Final   Klebsiella pneumoniae NOT DETECTED NOT DETECTED Final   Proteus species NOT DETECTED NOT DETECTED Final   Serratia marcescens NOT DETECTED NOT DETECTED Final   Carbapenem resistance NOT DETECTED NOT DETECTED Final   Haemophilus influenzae NOT DETECTED NOT DETECTED Final   Neisseria meningitidis NOT DETECTED NOT DETECTED Final   Pseudomonas aeruginosa NOT DETECTED NOT DETECTED Final   Candida albicans NOT DETECTED NOT DETECTED Final   Candida glabrata NOT DETECTED NOT DETECTED Final   Candida krusei NOT DETECTED NOT DETECTED Final   Candida parapsilosis NOT DETECTED NOT DETECTED Final   Candida tropicalis NOT DETECTED NOT DETECTED Final    Comment: Performed at [facility]  Urine culture     Status: Abnormal   Collection Time: 04/07/16 12:40 AM  Result Value Ref Range Status   Specimen Description URINE, CATHETERIZED  Final   Special Requests NONE  Final   Culture >=100,000 COLONIES/mL ENTEROCOCCUS SPECIES (A)  Final   Report Status 04/09/2016 FINAL  Final   Organism ID, Bacteria ENTEROCOCCUS SPECIES (A)  Final      Susceptibility   Enterococcus species - MIC*    AMPICILLIN <=2  SENSITIVE Sensitive     LEVOFLOXACIN >=8 RESISTANT Resistant     NITROFURANTOIN <=16 SENSITIVE Sensitive     VANCOMYCIN 1 SENSITIVE Sensitive     * >=100,000 COLONIES/mL ENTEROCOCCUS SPECIES  MRSA PCR Screening     Status: None   Collection Time: 04/07/16  7:32 AM  Result Value Ref Range Status  MRSA by PCR NEGATIVE NEGATIVE Final    Comment:        The GeneXpert MRSA Assay (FDA approved for NASAL specimens only), is one component of a comprehensive MRSA colonization surveillance program. It is not intended to diagnose MRSA infection nor to guide or monitor treatment for MRSA infections.   Culture, body fluid-bottle     Status: None (Preliminary result)   Collection Time: 04/08/16 10:11 AM  Result Value Ref Range Status   Specimen Description FLUID ASCITIC  Final   Special Requests NONE  Final   Culture   Final    NO GROWTH 2 DAYS Performed at [facility]    Report Status PENDING  Incomplete  Gram stain     Status: None   Collection Time: 04/08/16 10:11 AM  Result Value Ref Range Status   Specimen Description FLUID ASCITIC  Final   Special Requests NONE  Final   Gram Stain   Final    MODERATE WBC PRESENT,BOTH PMN AND MONONUCLEAR NO ORGANISMS SEEN Performed at [facility]    Report Status 04/08/2016 FINAL  Final  Culture, blood (Routine X 2) w Reflex to ID Panel     Status: None (Preliminary result)   Collection Time: 04/09/16 11:10 AM  Result Value Ref Range Status   Specimen Description BLOOD RIGHT HAND  Final   Special Requests IN PEDIATRIC BOTTLE .5CC  Final   Culture   Final    NO GROWTH 1 DAY Performed at [facility]    Report Status PENDING  Incomplete  Culture, blood (Routine X 2) w Reflex to ID Panel     Status: None (Preliminary result)   Collection Time: 04/09/16 11:10 AM  Result Value Ref Range Status   Specimen Description BLOOD RIGHT ARM  Final   Special Requests BOTTLES DRAWN AEROBIC ONLY [place]  Final   Culture   Final     NO GROWTH 1 DAY Performed at [facility]    Report Status PENDING  Incomplete     Studies: No results found.  Scheduled Meds: . ampicillin (OMNIPEN) IV  2 g Intravenous Q6H  . aspirin EC  81 mg Oral Daily  . carvedilol  3.125 mg Oral BID WC  . cefTRIAXone (ROCEPHIN)  IV  2 g Intravenous Q12H  . clopidogrel  75 mg Oral Daily  . feeding supplement (ENSURE ENLIVE)  237 mL Oral TID BM  . gabapentin  300 mg Oral BID  . guaiFENesin  600 mg Oral BID  . insulin aspart  0-9 Units Subcutaneous TID WC  . ipratropium  0.5 mg Nebulization TID  . isosorbide mononitrate  30 mg Oral Daily  . levalbuterol  1.25 mg Nebulization TID  . pravastatin  40 mg Oral Daily  . predniSONE  50 mg Oral Q breakfast  . spironolactone  12.5 mg Oral Daily  . vitamin B-12  1,000 mcg Oral Daily    Continuous Infusions:     Time spent: 34mins  Reyes Fifield MD, PhD  [facility] Pager [phone]. If 7PM-7AM, please contact night-coverage at [email or web address], password [facility] 04/11/2016, 2:30 PM  LOS: 4 days

## 2016-04-12 DIAGNOSIS — R7881 Bacteremia: Secondary | ICD-10-CM

## 2016-04-12 LAB — CBC
HCT: 31.6 % — ABNORMAL LOW (ref 39.0–52.0)
HEMATOCRIT: 29.6 % — AB (ref 39.0–52.0)
HEMOGLOBIN: 9.9 g/dL — AB (ref 13.0–17.0)
HEMOGLOBIN: 10.4 g/dL — AB (ref 13.0–17.0)
MCH: 27.8 pg (ref 26.0–34.0)
MCH: 27.7 pg (ref 26.0–34.0)
MCHC: 33.4 g/dL (ref 30.0–36.0)
MCHC: 32.9 g/dL (ref 30.0–36.0)
MCV: 83.1 fL (ref 78.0–100.0)
MCV: 84 fL (ref 78.0–100.0)
PLATELETS: 80 10*3/uL — AB (ref 150–400)
Platelets: 52 10*3/uL — ABNORMAL LOW (ref 150–400)
RBC: 3.56 MIL/uL — AB (ref 4.22–5.81)
RBC: 3.76 MIL/uL — AB (ref 4.22–5.81)
RDW: 17.6 % — AB (ref 11.5–15.5)
RDW: 17.7 % — ABNORMAL HIGH (ref 11.5–15.5)
WBC: 5.2 10*3/uL (ref 4.0–10.5)
WBC: 7.7 10*3/uL (ref 4.0–10.5)

## 2016-04-12 LAB — COMPREHENSIVE METABOLIC PANEL
ALT: 40 U/L (ref 17–63)
AST: 36 U/L (ref 15–41)
Albumin: 2.9 g/dL — ABNORMAL LOW (ref 3.5–5.0)
Alkaline Phosphatase: 133 U/L — ABNORMAL HIGH (ref 38–126)
Anion gap: 14 (ref 5–15)
BILIRUBIN TOTAL: 0.8 mg/dL (ref 0.3–1.2)
BUN: 49 mg/dL — AB (ref 6–20)
CO2: 18 mmol/L — ABNORMAL LOW (ref 22–32)
CREATININE: 1.15 mg/dL (ref 0.61–1.24)
Calcium: 8.8 mg/dL — ABNORMAL LOW (ref 8.9–10.3)
Chloride: 106 mmol/L (ref 101–111)
GFR, EST NON AFRICAN AMERICAN: 56 mL/min — AB (ref >60)
Glucose, Bld: 197 mg/dL — ABNORMAL HIGH (ref 65–99)
Potassium: 4.7 mmol/L (ref 3.5–5.1)
Sodium: 138 mmol/L (ref 135–145)
TOTAL PROTEIN: 4.9 g/dL — AB (ref 6.5–8.1)

## 2016-04-12 LAB — GLUCOSE, CAPILLARY
GLUCOSE-CAPILLARY: 192 mg/dL — AB (ref 65–99)
GLUCOSE-CAPILLARY: 274 mg/dL — AB (ref 65–99)
GLUCOSE-CAPILLARY: 221 mg/dL — AB (ref 65–99)
Glucose-Capillary: 219 mg/dL — ABNORMAL HIGH (ref 65–99)

## 2016-04-12 LAB — HEPATIC FUNCTION PANEL: Bilirubin, Total: 0.8 mg/dL

## 2016-04-12 LAB — CBC AND DIFFERENTIAL: WBC: 7.7 10^3/mL

## 2016-04-12 MED ORDER — SODIUM CHLORIDE 0.9% FLUSH: 10.0000 mL | INTRAVENOUS | Status: DC | PRN

## 2016-04-12 NOTE — Clinical Social Work Placement (Signed)
   CLINICAL SOCIAL WORK PLACEMENT  NOTE  Date:  04/12/2016  Patient Details  Name: TOMIE BIRTCHER MRN: [id number] Date of Birth: August 14, 1927  Clinical Social Work is seeking post-discharge placement for this patient at the [place] level of care (*CSW will initial, date and re-position this form in  chart as items are completed):  No   Patient/family provided with [place] Work Department's list of facilities offering this level of care within the geographic area requested by the patient (or if unable, by the patient's family).  Yes   Patient/family informed of their freedom to choose among providers that offer the needed level of care, that participate in Medicare, Medicaid or managed care program needed by the patient, have an available bed and are willing to accept the patient.  Yes   Patient/family informed of [place]'s ownership interest in [facility] and [facility], as well as of the fact that they are under no obligation to receive care at these facilities.  PASRR submitted to EDS on       PASRR number received on       Existing PASRR number confirmed on 04/12/16     FL2 transmitted to all facilities in geographic area requested by pt/family on 04/12/16     FL2 transmitted to all facilities within larger geographic area on       Patient informed that his/her managed care company has contracts with or will negotiate with certain facilities, including the following:            Patient/family informed of bed offers received.  Patient chooses bed at       Physician recommends and patient chooses bed at      Patient to be transferred to   on  .  Patient to be transferred to facility by       Patient family notified on   of transfer.  Name of family member notified:        PHYSICIAN       Additional Comment:    _______________________________________________ Luretha Rued, [place] 04/12/2016, 2:35 PM

## 2016-04-12 NOTE — Progress Notes (Signed)
Peripherally Inserted Central Catheter/Midline Placement  The IV Nurse has discussed with the patient and/or persons authorized to consent for the patient, the purpose of this procedure and the potential benefits and risks involved with this procedure.  The benefits include less needle sticks, lab draws from the catheter and patient may be discharged home with the catheter.  Risks include, but not limited to, infection, bleeding, blood clot (thrombus formation), and puncture of an artery; nerve damage and irregular heat beat.  Alternatives to this procedure were also discussed.  PICC/Midline Placement Documentation  PICC Single Lumen [id number] PICC Right Basilic 42 cm 0 cm (Active)  Indication for Insertion or Continuance of Line Home intravenous therapies (PICC only) 04/12/2016  7:00 PM  Exposed Catheter (cm) 0 cm 04/12/2016  7:00 PM  Dressing Change Due 04/19/16 04/12/2016  7:00 PM       Jule Economy [place] 04/12/2016, 7:00 PM

## 2016-04-12 NOTE — Progress Notes (Signed)
Subjective: No new complaints, feels much better   Antibiotics:  Anti-infectives    Start     Dose/Rate Route Frequency Ordered Stop   04/09/16 1400  ampicillin (OMNIPEN) 2 g in sodium chloride 0.9 % 50 mL IVPB     2 g 150 mL/hr over 20 Minutes Intravenous Every 6 hours 04/09/16 1130     04/09/16 1230  cefTRIAXone (ROCEPHIN) 2 g in dextrose 5 % 50 mL IVPB     2 g 100 mL/hr over 30 Minutes Intravenous Every 12 hours 04/09/16 1130     04/08/16 1200  vancomycin (VANCOCIN) IVPB 750 mg/150 ml premix  Status:  Discontinued     750 mg 150 mL/hr over 60 Minutes Intravenous Every 12 hours 04/08/16 1035 04/09/16 1129   04/07/16 2300  Ampicillin-Sulbactam (UNASYN) 3 g in sodium chloride 0.9 % 100 mL IVPB  Status:  Discontinued     3 g 100 mL/hr over 60 Minutes Intravenous Every 6 hours 04/07/16 2222 04/09/16 1130   04/07/16 1800  vancomycin (VANCOCIN) IVPB 750 mg/150 ml premix  Status:  Discontinued     750 mg 150 mL/hr over 60 Minutes Intravenous Every 12 hours 04/07/16 0537 04/07/16 2222   04/07/16 1400  aztreonam (AZACTAM) 1 g in dextrose 5 % 50 mL IVPB  Status:  Discontinued     1 g 100 mL/hr over 30 Minutes Intravenous Every 8 hours 04/07/16 0537 04/07/16 0847   04/07/16 1000  ceFEPIme (MAXIPIME) 2 g in dextrose 5 % 50 mL IVPB  Status:  Discontinued     2 g 100 mL/hr over 30 Minutes Intravenous Every 12 hours 04/07/16 0848 04/07/16 2222   04/07/16 0430  aztreonam (AZACTAM) 2 g in dextrose 5 % 50 mL IVPB  Status:  Discontinued     2 g 100 mL/hr over 30 Minutes Intravenous  Once 04/07/16 0419 04/07/16 0420   04/07/16 0430  vancomycin (VANCOCIN) IVPB 1000 mg/200 mL premix  Status:  Discontinued     1,000 mg 200 mL/hr over 60 Minutes Intravenous  Once 04/07/16 0419 04/07/16 0420   04/07/16 0000  levofloxacin (LEVAQUIN) IVPB 750 mg     750 mg 100 mL/hr over 90 Minutes Intravenous  Once 04/06/16 2345 04/07/16 0235   04/07/16 0000  aztreonam (AZACTAM) 2 g in dextrose 5 % 50 mL  IVPB     2 g 100 mL/hr over 30 Minutes Intravenous  Once 04/06/16 2345 04/07/16 0329   04/07/16 0000  vancomycin (VANCOCIN) IVPB 1000 mg/200 mL premix     1,000 mg 200 mL/hr over 60 Minutes Intravenous  Once 04/06/16 2345 04/07/16 0437      Medications: Scheduled Meds: . ampicillin (OMNIPEN) IV  2 g Intravenous Q6H  . aspirin EC  81 mg Oral Daily  . carvedilol  3.125 mg Oral BID WC  . cefTRIAXone (ROCEPHIN)  IV  2 g Intravenous Q12H  . clopidogrel  75 mg Oral Daily  . famotidine  20 mg Oral BID  . feeding supplement (ENSURE ENLIVE)  237 mL Oral TID BM  . gabapentin  300 mg Oral BID  . guaiFENesin  600 mg Oral BID  . insulin aspart  0-9 Units Subcutaneous TID WC  . ipratropium  0.5 mg Nebulization TID  . isosorbide mononitrate  30 mg Oral Daily  . levalbuterol  1.25 mg Nebulization TID  . methylPREDNISolone (SOLU-MEDROL) injection  60 mg Intravenous Q8H  . pravastatin  40 mg Oral Daily  .  spironolactone  12.5 mg Oral Daily  . vitamin B-12  1,000 mcg Oral Daily   Continuous Infusions:  PRN Meds:.acetaminophen, hydrOXYzine, ketotifen, levalbuterol, menthol-cetylpyridinium, nitroGLYCERIN, ondansetron (ZOFRAN) IV, zolpidem    Objective: Weight change:   Intake/Output Summary (Last 24 hours) at 04/12/16 1746 Last data filed at 04/12/16 1600  Gross per 24 hour  Intake    150 ml  Output   1275 ml  Net  -1125 ml   Blood pressure 116/55, pulse 55, temperature 97.6 F (36.4 C), temperature source Axillary, resp. rate 20, height 6' (1.829 m), weight 190 lb 4.1 oz (86.3 kg), SpO2 95 %. Temp:  [97.6 F (36.4 C)-97.8 F (36.6 C)] 97.6 F (36.4 C) (05/08 0609) Pulse Rate:  [55-70] 55 (05/08 0609) Resp:  [18-20] 20 (05/08 0609) BP: (116-136)/(55-64) 116/55 mmHg (05/08 0609) SpO2:  [95 %-100 %] 95 % (05/08 1400) FiO2 (%):  [28 %] 28 % (05/08 1305)  Physical Exam:  General: Alert and awake, oriented x3 and appears much more comfortable HEENT: anicteric sclera, EOMI,  oropharynx clear and without exudate Cardiovascular: regular rate, normal r, no murmur rubs or gallops heard Pulmonary: tachypneic with wheezes throughout Gastrointestinal: soft nontender, nondistended, normal bowel sounds, Musculoskeletal: + edema Skin, soft tissue: no rashes Neuro: nonfocal, strength and sensation intact   CBC:  CBC Latest Ref Rng 04/12/2016 04/12/2016 04/10/2016  WBC 4.0 - 10.5 K/uL 7.7 5.2 7.0  Hemoglobin 13.0 - 17.0 g/dL 10.4(L) 9.9(L) 9.4(L)  Hematocrit 39.0 - 52.0 % 31.6(L) 29.6(L) 28.2(L)  Platelets 150 - 400 K/uL 80(L) 52(L) 71(L)       BMET  Recent Labs  04/11/16 0509 04/12/16 0525  NA 138 138  K 3.3* 4.7  CL 104 106  CO2 25 18*  GLUCOSE 133* 197*  BUN 55* 49*  CREATININE 1.25* 1.15  CALCIUM 8.8* 8.8*     Liver Panel   Recent Labs  04/11/16 0509 04/12/16 0525  PROT 5.1* 4.9*  ALBUMIN 3.1* 2.9*  AST 45* 36  ALT 48 40  ALKPHOS 160* 133*  BILITOT 0.9 0.8       Sedimentation Rate No results for input(s): ESRSEDRATE in the last 72 hours. C-Reactive Protein No results for input(s): CRP in the last 72 hours.  Micro Results: Recent Results (from the past 720 hour(s))  Culture, sputum-assessment     Status: None   Collection Time: 03/13/16  8:16 PM  Result Value Ref Range Status   Specimen Description SPUTUM  Final   Special Requests NONE  Final   Sputum evaluation THIS SPECIMEN IS ACCEPTABLE FOR SPUTUM CULTURE  Final   Report Status 03/13/2016 FINAL  Final  Culture, respiratory (NON-Expectorated)     Status: None   Collection Time: 03/13/16  8:16 PM  Result Value Ref Range Status   Specimen Description SPU  Final   Special Requests NONE  Final   Gram Stain   Final    ABUNDANT WBC PRESENT, PREDOMINANTLY PMN RARE SQUAMOUS EPITHELIAL CELLS PRESENT FEW GRAM POSITIVE COCCI IN PAIRS IN CLUSTERS THIS SPECIMEN IS ACCEPTABLE FOR SPUTUM CULTURE Performed at [facility]    Culture   Final    NORMAL OROPHARYNGEAL  FLORA Performed at [facility]    Report Status 03/16/2016 FINAL  Final  Blood Culture (routine x 2)     Status: Abnormal   Collection Time: 04/07/16 12:13 AM  Result Value Ref Range Status   Specimen Description BLOOD RIGHT ANTECUBITAL  Final   Special Requests BOTTLES DRAWN AEROBIC  AND ANAEROBIC 5CC  Final   Culture  Setup Time   Final    GRAM POSITIVE COCCI IN CHAINS IN PAIRS IN BOTH AEROBIC AND ANAEROBIC BOTTLES Organism ID to follow CRITICAL RESULT CALLED TO, READ BACK BY AND VERIFIED WITH: C SHADE PHARMD 2027 04/07/16 A BROWNING Performed at [facility] (A)  Final   Report Status 04/09/2016 FINAL  Final   Organism ID, Bacteria ENTEROCOCCUS SPECIES  Final      Susceptibility   Enterococcus species - MIC*    AMPICILLIN <=2 SENSITIVE Sensitive     VANCOMYCIN 1 SENSITIVE Sensitive     GENTAMICIN SYNERGY RESISTANT Resistant     * ENTEROCOCCUS SPECIES  Blood Culture (routine x 2)     Status: Abnormal   Collection Time: 04/07/16 12:13 AM  Result Value Ref Range Status   Specimen Description BLOOD LEFT HAND  Final   Special Requests BOTTLES DRAWN AEROBIC AND ANAEROBIC 4CC  Final   Culture  Setup Time   Final    GRAM POSITIVE COCCI IN CHAINS IN PAIRS IN BOTH AEROBIC AND ANAEROBIC BOTTLES CRITICAL RESULT CALLED TO, READ BACK BY AND VERIFIED WITH: J. GADHIA, PHARM D AT 1000 ON [id number] BY Rhea Bleacher    Culture (A)  Final    ENTEROCOCCUS SPECIES SUSCEPTIBILITIES PERFORMED ON PREVIOUS CULTURE WITHIN THE LAST 5 DAYS. Performed at [facility]    Report Status 04/09/2016 FINAL  Final  Blood Culture ID Panel (Reflexed)     Status: Abnormal   Collection Time: 04/07/16 12:13 AM  Result Value Ref Range Status   Enterococcus species DETECTED (A) NOT DETECTED Final    Comment: CRITICAL RESULT CALLED TO, READ BACK BY AND VERIFIED WITH: C SHADE PHARMD 2027 04/07/16 A BROWNING    Vancomycin resistance NOT DETECTED NOT DETECTED Final    Listeria monocytogenes NOT DETECTED NOT DETECTED Final   Staphylococcus species NOT DETECTED NOT DETECTED Final   Staphylococcus aureus NOT DETECTED NOT DETECTED Final   Methicillin resistance NOT DETECTED NOT DETECTED Final   Streptococcus species NOT DETECTED NOT DETECTED Final   Streptococcus agalactiae NOT DETECTED NOT DETECTED Final   Streptococcus pneumoniae NOT DETECTED NOT DETECTED Final   Streptococcus pyogenes NOT DETECTED NOT DETECTED Final   Acinetobacter baumannii NOT DETECTED NOT DETECTED Final   Enterobacteriaceae species NOT DETECTED NOT DETECTED Final   Enterobacter cloacae complex NOT DETECTED NOT DETECTED Final   Escherichia coli NOT DETECTED NOT DETECTED Final   Klebsiella oxytoca NOT DETECTED NOT DETECTED Final   Klebsiella pneumoniae NOT DETECTED NOT DETECTED Final   Proteus species NOT DETECTED NOT DETECTED Final   Serratia marcescens NOT DETECTED NOT DETECTED Final   Carbapenem resistance NOT DETECTED NOT DETECTED Final   Haemophilus influenzae NOT DETECTED NOT DETECTED Final   Neisseria meningitidis NOT DETECTED NOT DETECTED Final   Pseudomonas aeruginosa NOT DETECTED NOT DETECTED Final   Candida albicans NOT DETECTED NOT DETECTED Final   Candida glabrata NOT DETECTED NOT DETECTED Final   Candida krusei NOT DETECTED NOT DETECTED Final   Candida parapsilosis NOT DETECTED NOT DETECTED Final   Candida tropicalis NOT DETECTED NOT DETECTED Final    Comment: Performed at [facility]  Urine culture     Status: Abnormal   Collection Time: 04/07/16 12:40 AM  Result Value Ref Range Status   Specimen Description URINE, CATHETERIZED  Final   Special Requests NONE  Final   Culture >=100,000 COLONIES/mL ENTEROCOCCUS SPECIES (A)  Final  Report Status 04/09/2016 FINAL  Final   Organism ID, Bacteria ENTEROCOCCUS SPECIES (A)  Final      Susceptibility   Enterococcus species - MIC*    AMPICILLIN <=2 SENSITIVE Sensitive     LEVOFLOXACIN >=8 RESISTANT Resistant      NITROFURANTOIN <=16 SENSITIVE Sensitive     VANCOMYCIN 1 SENSITIVE Sensitive     * >=100,000 COLONIES/mL ENTEROCOCCUS SPECIES  MRSA PCR Screening     Status: None   Collection Time: 04/07/16  7:32 AM  Result Value Ref Range Status   MRSA by PCR NEGATIVE NEGATIVE Final    Comment:        The GeneXpert MRSA Assay (FDA approved for NASAL specimens only), is one component of a comprehensive MRSA colonization surveillance program. It is not intended to diagnose MRSA infection nor to guide or monitor treatment for MRSA infections.   Culture, body fluid-bottle     Status: None (Preliminary result)   Collection Time: 04/08/16 10:11 AM  Result Value Ref Range Status   Specimen Description FLUID ASCITIC  Final   Special Requests NONE  Final   Culture   Final    NO GROWTH 4 DAYS Performed at [facility]    Report Status PENDING  Incomplete  Gram stain     Status: None   Collection Time: 04/08/16 10:11 AM  Result Value Ref Range Status   Specimen Description FLUID ASCITIC  Final   Special Requests NONE  Final   Gram Stain   Final    MODERATE WBC PRESENT,BOTH PMN AND MONONUCLEAR NO ORGANISMS SEEN Performed at [facility]    Report Status 04/08/2016 FINAL  Final  Culture, blood (Routine X 2) w Reflex to ID Panel     Status: None (Preliminary result)   Collection Time: 04/09/16 11:10 AM  Result Value Ref Range Status   Specimen Description BLOOD RIGHT HAND  Final   Special Requests IN PEDIATRIC BOTTLE .5CC  Final   Culture   Final    NO GROWTH 3 DAYS Performed at [facility]    Report Status PENDING  Incomplete  Culture, blood (Routine X 2) w Reflex to ID Panel     Status: None (Preliminary result)   Collection Time: 04/09/16 11:10 AM  Result Value Ref Range Status   Specimen Description BLOOD RIGHT ARM  Final   Special Requests BOTTLES DRAWN AEROBIC ONLY [place]  Final   Culture   Final    NO GROWTH 3 DAYS Performed at [facility]    Report  Status PENDING  Incomplete    Studies/Results: No results found.    Assessment/Plan:  INTERVAL HISTORY:   Cultures yielding AMP S enterococcus though not synergistic with Norva Karvonen   Principal Problem:   SIRS (systemic inflammatory response syndrome) (HCC) Active Problems:   Essential hypertension   Mantle cell lymphoma (HCC)   S/P TAVR (transcatheter aortic valve replacement)   Type 2 diabetes mellitus with vascular disease (HCC)   Pressure ulcer   Ascites   Pyrexia   Sepsis (HCC)   Enterococcal sepsis (HCC)   Paroxysmal atrial fibrillation (HCC)   Wheezing   Diabetic foot ulcer associated with type 1 diabetes mellitus ([place])   Prosthetic valve endocarditis (HCC)   Severe sepsis with septic shock ([place])   Diabetes mellitus type 2, noninsulin dependent ([place])    [name] is a 82 y.o. 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 Losartan Other (See Comments)    Hyperkalemia > 7  . Azithromycin Itching  . Contrast Media [Iodinated Diagnostic Agents] Rash  . Doxycycline Itching       . Keflex [Cephalexin] Itching  . Macrodantin Itching  . Minocycline Hcl Itching  . Nitrofurantoin Itching  . Zithromax [Azithromycin Dihydrate] Itching    Discharge antibiotics: Ampicillin per pharmacy and Ceftriaxone 2 grams IV q 12 hours  Per pharmacy protocol Ampicillin  Duration: 6 weeks  End Date:  05/20/16  [facility]  Care Per Protocol:  Labs weekly while on IV antibiotics: _x_ CBC with differential _x_ BMP _x_ CRP _x_ ESR   Fax weekly labs to [phone]  Clinic Follow Up Appt:  Early July for surveillance blood cultures  I will arrange [facility] in our clinic in early July.     LOS: 5 days   Alcide Evener 04/12/2016, 5:46 PM

## 2016-04-12 NOTE — Progress Notes (Signed)
CSW assisting with d/c planning. Still no decision from [facility] ( [facility] ) regarding bed offer. Pt / spouse has been updated and additional bed offers provided. Pt's second choice is [facility] SNF contacted and message left that bed may be needed. Awaiting return call from both [facility] and [facility].  Werner Lean LCSW [phone]

## 2016-04-12 NOTE — Progress Notes (Signed)
PROGRESS NOTE  [name] [id number] DOB: 1927/03/24 DOA: 04/06/2016 PCP: Donnajean Lopes, MD  HPI/Recap of past 24 hours:  Feeling better, less wheezing, bp stable  Wife in room   Assessment/Plan: Principal Problem:   SIRS (systemic inflammatory response syndrome) ([place]) Active Problems:   Essential hypertension   Mantle cell lymphoma (HCC)   S/P TAVR (transcatheter aortic valve replacement)   Type 2 diabetes mellitus with vascular disease (HCC)   Pressure ulcer   Ascites   Pyrexia   Sepsis (HCC)   Enterococcal sepsis (HCC)   Paroxysmal atrial fibrillation (HCC)   Wheezing   Diabetic foot ulcer associated with type 1 diabetes mellitus ([place])   Prosthetic valve endocarditis ([place])   Severe sepsis with septic shock ([place])   Diabetes mellitus type 2, noninsulin dependent ([place])  Sepsis/spetic shock with enterococcus bacteremia/enterococcus UTI:  required icu consult, treated with pressor briefly on 5/3. Stress dose steroids, ivf. Broad spectrum abx.  Vital stabilized on 5/4. Leukocytosis/lactic acid normalized, patient has history of TAVR, 2DEcho no vegetation seen ,  repeat blood culture no growth.  Source of bacteremia? Likely from UTI,   He did have ascites, complain of ab pain on presentation, US paracentesis on 5/4 with 226 wbc 72% neutrophil, gram stain no organism, culture no growth, cytology negative for malignancy.  Ab pain has resolved. ID consulted, input appreciated, now on amp/rocephin double coverage, will likely need total of 6 weeks to empirically cover endocarditis ( has not able to perform TEE due to thrombocytopenia) picc line placement.  Enterococcus Bacteremia: was on vanc/cefepime, then unasyn, then amp/rocephin per ID recommendation.  Wheezing developed on 5/4 am, stat cxr no acute findings , does has chronic scarring, no significant sign of fluids overload, will d/c ivf, start xopenex/atrovent, trial dose of lasix Per patient he has been having  chronic intermittent wheezing since his heart surgery in 1995, reported quit smoking 4yrs ago, he smoked 94yrs prior to that, never had lung function test. Persistent wheezing, will increase steroids to solumedrol 60tid, continue nebs/mucinex/abx Less wheezing with addition of steroids. Will benefit from outpatient lung function test and pulmonology referral  H/o CAD s/p CABG with stenting: patient does has Elevated troponin in the setting of sepsis, denies chest pain, no acute chest pain, he was started on heparin drip briefly in the ED, this was stopped later due to thrombocytopenia, echo with reduced ef 25-30%, he is continued on plavix/statin. Cardiology consulted  Afib: new diagnosis. CHADSvasc score 4 with dm2, age, htn. But he had thrombocytopenia, started lopressor , now on coreg,  cardiology consulted. meds adjusted.  Acute Systolic CHF: ef [id number] in 11/2015, 25% this hospitalization in the setting of severe sepsis, he is currently euvolemic, on coreg and spironolactone, he si not a candidate for arb/acei due to history of hyperkalemia per cardiology. His cr increased with lasix, now lasix held, further med adjustment per cardiology.  Thrombocytopenia, hopefully this will recover with treatment of sepsis. He does has splenomegaly. plt seems trending up  noninsulin dependent dm2, a1c 6.3. metformin held, on ssi here  HTN: bp med held on 5/3 due to septic shock, bp stabilized off pressor on 5/4.  Bp stable on coreg , imdur, spironolactone.   H/o mantle cell lymphoma: CT scan with new liver lesions, splenomegaly, small pleural effusion, small ascites, US guided paracentesis with 1.2liter of slightly hazy, yellow fluids, no malignant cells by pathology report, oncology input appreciated.  FTT: family report patient is weaker than usual. PT/OT,  SNF  Code Status: DNR  Family Communication: patient , wife and daughter on 5/6, 5/7, 5/8  Disposition Plan:     SNF on  5/9   Consultants:  pccm  Oncology  Cardiology  ID  Procedures:  US guided paracentesis on 5/4  Antibiotics:  Vanc/cefepime on 5/3  unasyn on 5/4---5/5  Amp/rocephin from 5/5   Objective: BP 116/55 mmHg  Pulse 55  Temp(Src) 97.6 F (36.4 C) (Axillary)  Resp 20  Ht 6' (1.829 m)  Wt 86.3 kg (190 lb 4.1 oz)  BMI 25.80 kg/m2  SpO2 100%  Intake/Output Summary (Last 24 hours) at 04/12/16 0801 Last data filed at 04/12/16 [id number]  Gross per 24 hour  Intake    480 ml  Output    775 ml  Net   -295 ml   Filed Weights   04/07/16 0005 04/07/16 0416 04/09/16 1304  Weight: 79.379 kg (175 lb) 80.3 kg (177 lb 0.5 oz) 86.3 kg (190 lb 4.1 oz)    Exam:   General:  frail  Cardiovascular: IRRR  Respiratory: less bilateral wheezing, still significant rhonchi, wonder chronic component   Abdomen: Soft/ND/NT, positive BS  Musculoskeletal: No Edema, 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 Likely baseline dementia  Data Reviewed: Basic Metabolic Panel:  Recent Labs Lab 04/07/16 1730 04/08/16 0322 04/10/16 0514 04/11/16 0509 04/12/16 0525  NA 132* 130* 137 138 138  K 5.0 4.6 3.7 3.3* 4.7  CL 105 104 105 104 106  CO2 17* 14* 21* 25 18*  GLUCOSE 117* 171* 150* 133* 197*  BUN 32* 38* 58* 55* 49*  CREATININE 1.23 1.27* 1.42* 1.25* 1.15  CALCIUM 8.0* 8.3* 8.7* 8.8* 8.8*   Liver Function Tests:  Recent Labs Lab 04/07/16 1730 04/08/16 0322 04/10/16 0514 04/11/16 0509 04/12/16 0525  AST 56* 62* 47* 45* 36  ALT 36 40 49 48 40  ALKPHOS 192* 200* 172* 160* 133*  BILITOT 1.7* 1.7* 1.0 0.9 0.8  PROT 4.9*  4.8* 5.1* 4.9* 5.1* 4.9*  ALBUMIN 2.9*  2.9* 3.0* 3.1* 3.1* 2.9*   No results for input(s): LIPASE, AMYLASE in the last 168 hours. No results for input(s): AMMONIA in the last 168 hours. CBC:  Recent Labs Lab 04/07/16 0013 04/07/16 0421 04/08/16 0322 04/09/16 0338 04/10/16 0514  WBC 12.9* 15.4* 9.0  11.1* 7.0  NEUTROABS 11.0* 13.1*  --   --   --   HGB 9.2* 9.4* 9.9* 9.6* 9.4*  HCT 27.7* 28.6* 29.7* 28.4* 28.2*  MCV 81.0 82.7 83.0 81.6 81.7  PLT 74* 60* 58* 59* 71*   Cardiac Enzymes:    Recent Labs Lab 04/07/16 0421 04/07/16 1013 04/07/16 1730 04/08/16 0322  TROPONINI 1.29* 1.27* 1.56* 1.19*   BNP (last 3 results)  Recent Labs  03/12/16 1856 04/08/16 1619  BNP 2301.3* 3365.9*    ProBNP (last 3 results) No results for input(s): PROBNP in the last 8760 hours.  CBG:  Recent Labs Lab 04/10/16 2211 04/11/16 0734 04/11/16 1137 04/11/16 1640 04/11/16 2110  GLUCAP 161* 147* 166* 172* 186*    Recent Results (from the past 240 hour(s))  Blood Culture (routine x 2)     Status: Abnormal   Collection Time: 04/07/16 12:13 AM  Result Value Ref Range Status   Specimen Description BLOOD RIGHT ANTECUBITAL  Final   Special Requests BOTTLES DRAWN AEROBIC AND ANAEROBIC 5CC  Final   Culture  Setup Time   Final    GRAM  POSITIVE COCCI IN CHAINS IN PAIRS IN BOTH AEROBIC AND ANAEROBIC BOTTLES Organism ID to follow CRITICAL RESULT CALLED TO, READ BACK BY AND VERIFIED WITH: C SHADE PHARMD 2027 04/07/16 A BROWNING Performed at [place] (A)  Final   Report Status 04/09/2016 FINAL  Final   Organism ID, Bacteria ENTEROCOCCUS SPECIES  Final      Susceptibility   Enterococcus species - MIC*    AMPICILLIN <=2 SENSITIVE Sensitive     VANCOMYCIN 1 SENSITIVE Sensitive     GENTAMICIN SYNERGY RESISTANT Resistant     * ENTEROCOCCUS SPECIES  Blood Culture (routine x 2)     Status: Abnormal   Collection Time: 04/07/16 12:13 AM  Result Value Ref Range Status   Specimen Description BLOOD LEFT HAND  Final   Special Requests BOTTLES DRAWN AEROBIC AND ANAEROBIC 4CC  Final   Culture  Setup Time   Final    GRAM POSITIVE COCCI IN CHAINS IN PAIRS IN BOTH AEROBIC AND ANAEROBIC BOTTLES CRITICAL RESULT CALLED TO, READ BACK BY AND VERIFIED WITH: J. GADHIA,  PHARM D AT 1000 ON [id number] BY Rhea Bleacher    Culture (A)  Final    ENTEROCOCCUS SPECIES SUSCEPTIBILITIES PERFORMED ON PREVIOUS CULTURE WITHIN THE LAST 5 DAYS. Performed at [facility]    Report Status 04/09/2016 FINAL  Final  Blood Culture ID Panel (Reflexed)     Status: Abnormal   Collection Time: 04/07/16 12:13 AM  Result Value Ref Range Status   Enterococcus species DETECTED (A) NOT DETECTED Final    Comment: CRITICAL RESULT CALLED TO, READ BACK BY AND VERIFIED WITH: C SHADE PHARMD 2027 04/07/16 A BROWNING    Vancomycin resistance NOT DETECTED NOT DETECTED Final   Listeria monocytogenes NOT DETECTED NOT DETECTED Final   Staphylococcus species NOT DETECTED NOT DETECTED Final   Staphylococcus aureus NOT DETECTED NOT DETECTED Final   Methicillin resistance NOT DETECTED NOT DETECTED Final   Streptococcus species NOT DETECTED NOT DETECTED Final   Streptococcus agalactiae NOT DETECTED NOT DETECTED Final   Streptococcus pneumoniae NOT DETECTED NOT DETECTED Final   Streptococcus pyogenes NOT DETECTED NOT DETECTED Final   Acinetobacter baumannii NOT DETECTED NOT DETECTED Final   Enterobacteriaceae species NOT DETECTED NOT DETECTED Final   Enterobacter cloacae complex NOT DETECTED NOT DETECTED Final   Escherichia coli NOT DETECTED NOT DETECTED Final   Klebsiella oxytoca NOT DETECTED NOT DETECTED Final   Klebsiella pneumoniae NOT DETECTED NOT DETECTED Final   Proteus species NOT DETECTED NOT DETECTED Final   Serratia marcescens NOT DETECTED NOT DETECTED Final   Carbapenem resistance NOT DETECTED NOT DETECTED Final   Haemophilus influenzae NOT DETECTED NOT DETECTED Final   Neisseria meningitidis NOT DETECTED NOT DETECTED Final   Pseudomonas aeruginosa NOT DETECTED NOT DETECTED Final   Candida albicans NOT DETECTED NOT DETECTED Final   Candida glabrata NOT DETECTED NOT DETECTED Final   Candida krusei NOT DETECTED NOT DETECTED Final   Candida parapsilosis NOT DETECTED NOT DETECTED  Final   Candida tropicalis NOT DETECTED NOT DETECTED Final    Comment: Performed at [facility]  Urine culture     Status: Abnormal   Collection Time: 04/07/16 12:40 AM  Result Value Ref Range Status   Specimen Description URINE, CATHETERIZED  Final   Special Requests NONE  Final   Culture >=100,000 COLONIES/mL ENTEROCOCCUS SPECIES (A)  Final   Report Status 04/09/2016 FINAL  Final   Organism ID, Bacteria ENTEROCOCCUS SPECIES (A)  Final      Susceptibility   Enterococcus species - MIC*    AMPICILLIN <=2 SENSITIVE Sensitive     LEVOFLOXACIN >=8 RESISTANT Resistant     NITROFURANTOIN <=16 SENSITIVE Sensitive     VANCOMYCIN 1 SENSITIVE Sensitive     * >=100,000 COLONIES/mL ENTEROCOCCUS SPECIES  MRSA PCR Screening     Status: None   Collection Time: 04/07/16  7:32 AM  Result Value Ref Range Status   MRSA by PCR NEGATIVE NEGATIVE Final    Comment:        The GeneXpert MRSA Assay (FDA approved for NASAL specimens only), is one component of a comprehensive MRSA colonization surveillance program. It is not intended to diagnose MRSA infection nor to guide or monitor treatment for MRSA infections.   Culture, body fluid-bottle     Status: None (Preliminary result)   Collection Time: 04/08/16 10:11 AM  Result Value Ref Range Status   Specimen Description FLUID ASCITIC  Final   Special Requests NONE  Final   Culture   Final    NO GROWTH 3 DAYS Performed at [facility]    Report Status PENDING  Incomplete  Gram stain     Status: None   Collection Time: 04/08/16 10:11 AM  Result Value Ref Range Status   Specimen Description FLUID ASCITIC  Final   Special Requests NONE  Final   Gram Stain   Final    MODERATE WBC PRESENT,BOTH PMN AND MONONUCLEAR NO ORGANISMS SEEN Performed at [facility]    Report Status 04/08/2016 FINAL  Final  Culture, blood (Routine X 2) w Reflex to ID Panel     Status: None (Preliminary result)   Collection Time: 04/09/16 11:10 AM   Result Value Ref Range Status   Specimen Description BLOOD RIGHT HAND  Final   Special Requests IN PEDIATRIC BOTTLE .5CC  Final   Culture   Final    NO GROWTH 2 DAYS Performed at [facility]    Report Status PENDING  Incomplete  Culture, blood (Routine X 2) w Reflex to ID Panel     Status: None (Preliminary result)   Collection Time: 04/09/16 11:10 AM  Result Value Ref Range Status   Specimen Description BLOOD RIGHT ARM  Final   Special Requests BOTTLES DRAWN AEROBIC ONLY [place]  Final   Culture   Final    NO GROWTH 2 DAYS Performed at [facility]    Report Status PENDING  Incomplete     Studies: No results found.  Scheduled Meds: . ampicillin (OMNIPEN) IV  2 g Intravenous Q6H  . aspirin EC  81 mg Oral Daily  . carvedilol  3.125 mg Oral BID WC  . cefTRIAXone (ROCEPHIN)  IV  2 g Intravenous Q12H  . clopidogrel  75 mg Oral Daily  . famotidine  20 mg Oral BID  . feeding supplement (ENSURE ENLIVE)  237 mL Oral TID BM  . gabapentin  300 mg Oral BID  . guaiFENesin  600 mg Oral BID  . insulin aspart  0-9 Units Subcutaneous TID WC  . ipratropium  0.5 mg Nebulization TID  . isosorbide mononitrate  30 mg Oral Daily  . levalbuterol  1.25 mg Nebulization TID  . methylPREDNISolone (SOLU-MEDROL) injection  60 mg Intravenous Q8H  . pravastatin  40 mg Oral Daily  . spironolactone  12.5 mg Oral Daily  . vitamin B-12  1,000 mcg Oral Daily    Continuous Infusions:     Time spent:  8mins  Richard Derrick MD, PhD  [facility] Pager [phone]. If 7PM-7AM, please contact night-coverage at [email or web address], password [facility] 04/12/2016, 8:01 AM  LOS: 5 days

## 2016-04-12 NOTE — Clinical Social Work Note (Signed)
Clinical Social Work Assessment  Patient Details  Name: [name] MRN: [id number] Date of Birth: 09-10-1927  Date of referral:  04/12/16               Reason for consult:  Facility Placement, Discharge Planning                Permission sought to share information with:  Chartered certified accountant granted to share information::  Yes, Verbal Permission Granted  Name::        Agency::     Relationship::     Contact Information:     Housing/Transportation Living arrangements for the past 2 months:  Single Family Home Source of Information:  Patient, Spouse Patient Interpreter Needed:  None Criminal Activity/Legal Involvement Pertinent to Current Situation/Hospitalization:  No - Comment as needed Significant Relationships:  Adult Children, Spouse Lives with:  Spouse Do you feel safe going back to the place where you live?   (SNF needed.) Need for family participation in patient care:  Yes (Comment)  Care giving concerns: Pt's care cannot be managed at home following hospital d/c.   Social Worker assessment / plan:  Pt hospitalized on 04/06/16 with SIRS. CSW consulted to assist with d/c planning. PN reviewed. PT has recommended SNF placement at d/c. Pt will need 6 wks of IV antibiotics. CSW met with pt / spouse [name] [phone] ) to review recommendations. Pt / spouse are in agreement wit SNF placement and have requested [facility] ( [facility] ). SNF search has been initiated and [facility] contacted. A decision is pending. Additional bed offers are available if [facility] is unable to assist. CSW will continue to follow to assist with d/c planning.  Employment status:  Retired Forensic scientist:  Medicare PT Recommendations:  [place] / Referral to community resources:  [place]  Patient/Family's Response to care:  Pt / spouse feel SNF placement is needed.  Patient/Family's Understanding of and Emotional Response to  Diagnosis, Current Treatment, and Prognosis: Pt / spouse are aware of medical status. Spouse reports that both she and pt have been to [facility] in the past. " we have had very good care at [facility] and it's close to home." Pt / spouse are hopeful [facility] will make a bed offer.  Emotional Assessment Appearance:  Appears stated age Attitude/Demeanor/Rapport:  Other (cooperative) Affect (typically observed):  Calm, Pleasant, Appropriate Orientation:  Oriented to Self, Oriented to Place, Oriented to  Time, Oriented to Situation Alcohol / Substance use:  Not Applicable Psych involvement (Current and /or in the community):  No (Comment)  Discharge Needs  Concerns to be addressed:  Discharge Planning Concerns Readmission within the last 30 days:  No Current discharge risk:  None Barriers to Discharge:  No Barriers Identified   Luretha Rued, [place] 04/12/2016, 2:28 PM

## 2016-04-12 NOTE — NC FL2 (Signed)
[place] LEVEL OF CARE SCREENING TOOL     IDENTIFICATION  Patient Name: [name] Birthdate: [birth year] Sex: male Admission Date (Current Location): 04/06/2016  [facility] and [place] Number:  Herbalist and Address:  [facility],  [place] [address] [place]      Provider Number: [id number]  Attending Physician Name and Address:  Florencia Reasons, MD  Relative Name and Phone Number:       Current Level of Care: Hospital Recommended Level of Care: [place] Prior Approval Number:    Date Approved/Denied:   PASRR Number: [id number]  Discharge Plan: SNF    Current Diagnoses: Patient Active Problem List   Diagnosis Date Noted  . Diabetes mellitus type 2, noninsulin dependent ([place])   . Prosthetic valve endocarditis ([place])   . Severe sepsis with septic shock ([place])   . Enterococcal sepsis ([place]) 04/08/2016  . Paroxysmal atrial fibrillation (HCC)   . Wheezing   . Diabetic foot ulcer associated with type 1 diabetes mellitus ([place])   . SIRS (systemic inflammatory response syndrome) ([place]) 04/07/2016  . Pressure ulcer 04/07/2016  . Acute encephalopathy   . Ascites   . Pyrexia   . Sepsis ([place])   . CAP (community acquired pneumonia) 03/12/2016  . Epigastric abdominal pain 03/12/2016  . Fever of unknown origin (FUO) 11/22/2014    Class: Acute  . Fever 11/20/2014  . Type 2 diabetes mellitus with vascular disease ([place]) 11/20/2014  . Severe aortic valve stenosis 11/05/2014  . S/P TAVR (transcatheter aortic valve replacement) 11/05/2014  . Anginal chest pain at rest [facility]) 10/14/2014  . Coronary atherosclerosis of native coronary artery 08/09/2014  . Leg DVT (deep venous thromboembolism), chronic ([place])   . History of echocardiogram   . Fever, unspecified 07/22/2014  . Syncope 07/21/2014  . Abscess of right leg 05/17/2014  . Foot osteomyelitis, left ([place]) 12/20/2013  . Hyperkalemia 11/23/2013  . Olecranon bursitis 09/12/2013   . Other pancytopenia ([place]) 03/05/2013    Class: Acute  . Diabetic foot ulcer associated with type 2 diabetes mellitus ([place]) 02/28/2013  . Mantle cell lymphoma ([place]) 02/28/2013  . Carotid artery disease ([place]) 02/28/2013  . Hyponatremia 02/28/2013  . Gangrenous toe ([place]) 11/21/2012  . Abnormal EKG 11/21/2012  . Peripheral arterial disease ([place]) 09/13/2012    Class: Chronic  . Pneumonitis 06/07/2012  . Dermatitis 06/07/2012  . Splenomegaly 04/13/2012  . History of blood transfusion 04/13/2012  . Lymphoma ([place]) 04/07/2012  . Neuralgia 03/16/2012  . BPH (benign prostatic hyperplasia) 03/16/2012  . Hyperlipidemia 03/16/2012  . Thrombocytopenia ([place])   . Pedal edema 02/08/2012  . Anemia 02/08/2012  . Carotid bruit 12/21/2011  . Diabetes ([place]) 12/19/2009  . Essential hypertension 12/19/2009  . CEREBROVASCULAR DISEASE 12/19/2009    Orientation RESPIRATION BLADDER Height & Weight     Self, Time, Situation, Place  O2 Continent Weight: 86.3 kg (190 lb 4.1 oz) Height:  6' (182.9 cm)  BEHAVIORAL SYMPTOMS/MOOD NEUROLOGICAL BOWEL NUTRITION STATUS  Other (Comment) (No Behaviors)   Incontinent Diet (NPO for procedure -expect to progress)  AMBULATORY STATUS COMMUNICATION OF NEEDS Skin   Limited Assist Verbally Other (Comment) (Stage 2 pressure ulcer on sacrum)                       Personal Care Assistance Level of Assistance  Bathing, Feeding, Dressing Bathing Assistance: Limited assistance Feeding assistance: Limited assistance Dressing Assistance: Limited assistance     Functional Limitations Info  Sight, Hearing, Speech Sight Info: Adequate Hearing Info: Adequate Speech Info: Adequate    SPECIAL CARE FACTORS FREQUENCY  PT (By licensed PT), OT (By licensed OT)     PT Frequency: 5 x wk OT Frequency: 5 x wk            Contractures Contractures Info: Not present    Additional Factors Info  Insulin Sliding Scale, Code Status Code Status Info: DNR              Current Medications (04/12/2016):  This is the current hospital active medication list Current Facility-Administered Medications  Medication Dose Route Frequency Provider Last Rate Last Dose  . acetaminophen (TYLENOL) tablet 650 mg  650 mg Oral Q6H PRN Rise Patience, MD   650 mg at 04/07/16 2021  . ampicillin (OMNIPEN) 2 g in sodium chloride 0.9 % 50 mL IVPB  2 g Intravenous Q6H Truman Hayward, MD   2 g at 04/12/16 0805  . aspirin EC tablet 81 mg  81 mg Oral Daily Rise Patience, MD   81 mg at 04/12/16 0806  . carvedilol (COREG) tablet 3.125 mg  3.125 mg Oral BID WC Erma Heritage, PA   3.125 mg at 04/12/16 0805  . cefTRIAXone (ROCEPHIN) 2 g in dextrose 5 % 50 mL IVPB  2 g Intravenous Q12H Truman Hayward, MD   2 g at 04/12/16 1011  . clopidogrel (PLAVIX) tablet 75 mg  75 mg Oral Daily Rise Patience, MD   75 mg at 04/12/16 0806  . famotidine (PEPCID) tablet 20 mg  20 mg Oral BID Florencia Reasons, MD   20 mg at 04/12/16 0806  . feeding supplement (ENSURE ENLIVE) (ENSURE ENLIVE) liquid 237 mL  237 mL Oral TID BM Rise Patience, MD   237 mL at 04/11/16 2203  . gabapentin (NEURONTIN) capsule 300 mg  300 mg Oral BID Rise Patience, MD   300 mg at 04/12/16 0806  . guaiFENesin (MUCINEX) 12 hr tablet 600 mg  600 mg Oral BID Florencia Reasons, MD   600 mg at 04/12/16 0805  . hydrOXYzine (ATARAX/VISTARIL) tablet 25 mg  25 mg Oral Q6H PRN Rise Patience, MD   25 mg at 04/08/16 2232  . insulin aspart (novoLOG) injection 0-9 Units  0-9 Units Subcutaneous TID WC Rise Patience, MD   3 Units at 04/12/16 [phone]  . ipratropium (ATROVENT) nebulizer solution 0.5 mg  0.5 mg Nebulization TID Florencia Reasons, MD   0.5 mg at 04/12/16 [id number]  . isosorbide mononitrate (IMDUR) 24 hr tablet 30 mg  30 mg Oral Daily Florencia Reasons, MD   30 mg at 04/12/16 0806  . ketotifen (ZADITOR) 0.025 % ophthalmic solution 1 drop  1 drop Right Eye BID PRN Florencia Reasons, MD   1 drop at 04/10/16 1636  . levalbuterol (XOPENEX)  nebulizer solution 0.63 mg  0.63 mg Nebulization Q6H PRN Florencia Reasons, MD   0.63 mg at 04/11/16 2047  . levalbuterol (XOPENEX) nebulizer solution 1.25 mg  1.25 mg Nebulization TID Florencia Reasons, MD   1.25 mg at 04/12/16 [id number]  . menthol-cetylpyridinium (CEPACOL) lozenge 3 mg  1 lozenge Oral PRN Dianne Dun, NP   3 mg at 04/10/16 2012  . methylPREDNISolone sodium succinate (SOLU-MEDROL) 125 mg/2 mL injection 60 mg  60 mg Intravenous Q8H Florencia Reasons, MD   60 mg at 04/12/16 0807  . nitroGLYCERIN (NITROSTAT) SL tablet 0.4 mg  0.4 mg Sublingual Q5 min  PRN Rise Patience, MD      . ondansetron [facility]) injection 4 mg  4 mg Intravenous Q6H PRN Dianne Dun, NP   4 mg at 04/10/16 2255  . pravastatin (PRAVACHOL) tablet 40 mg  40 mg Oral Daily Rise Patience, MD   40 mg at 04/12/16 0806  . spironolactone (ALDACTONE) tablet 12.5 mg  12.5 mg Oral Daily Erma Heritage, [place]   12.5 mg at 04/12/16 0806  . vitamin B-12 (CYANOCOBALAMIN) tablet 1,000 mcg  1,000 mcg Oral Daily Rise Patience, MD   1,000 mcg at 04/12/16 [id number]  . zolpidem (AMBIEN) tablet 5 mg  5 mg Oral QHS PRN Dianne Dun, NP   5 mg at 04/12/16 0025     Discharge Medications: Please see discharge summary for a list of discharge medications.  Relevant Imaging Results:  Relevant Lab Results:   Additional Information SS # [id number]. Pt will need 6 wks of ampicilin and rocepin. PICC line will be placed prior to d/c.  [place], Randall An, LCSW

## 2016-04-12 NOTE — NC FL2 (Deleted)
[place] LEVEL OF CARE SCREENING TOOL     IDENTIFICATION  Patient Name: [name] Birthdate: 31-Jul-1927 Sex: male Admission Date (Current Location): 04/06/2016  [facility] and [place] Number:  Herbalist and Address:  [facility],  [place] [address] [place]      Provider Number: [id number]  Attending Physician Name and Address:  Florencia Reasons, MD  Relative Name and Phone Number:       Current Level of Care: Hospital Recommended Level of Care: [place] Prior Approval Number:    Date Approved/Denied:   PASRR Number: [id number]  Discharge Plan: SNF    Current Diagnoses: Patient Active Problem List   Diagnosis Date Noted  . Diabetes mellitus type 2, noninsulin dependent ([place])   . Prosthetic valve endocarditis ([place])   . Severe sepsis with septic shock ([place])   . Enterococcal sepsis ([place]) 04/08/2016  . Paroxysmal atrial fibrillation (HCC)   . Wheezing   . Diabetic foot ulcer associated with type 1 diabetes mellitus ([place])   . SIRS (systemic inflammatory response syndrome) ([place]) 04/07/2016  . Pressure ulcer 04/07/2016  . Acute encephalopathy   . Ascites   . Pyrexia   . Sepsis ([place])   . CAP (community acquired pneumonia) 03/12/2016  . Epigastric abdominal pain 03/12/2016  . Fever of unknown origin (FUO) 11/22/2014    Class: Acute  . Fever 11/20/2014  . Type 2 diabetes mellitus with vascular disease ([place]) 11/20/2014  . Severe aortic valve stenosis 11/05/2014  . S/P TAVR (transcatheter aortic valve replacement) 11/05/2014  . Anginal chest pain at rest [facility]) 10/14/2014  . Coronary atherosclerosis of native coronary artery 08/09/2014  . Leg DVT (deep venous thromboembolism), chronic ([place])   . History of echocardiogram   . Fever, unspecified 07/22/2014  . Syncope 07/21/2014  . Abscess of right leg 05/17/2014  . Foot osteomyelitis, left ([place]) 12/20/2013  . Hyperkalemia 11/23/2013  . Olecranon bursitis 09/12/2013   . Other pancytopenia ([place]) 03/05/2013    Class: Acute  . Diabetic foot ulcer associated with type 2 diabetes mellitus ([place]) 02/28/2013  . Mantle cell lymphoma ([place]) 02/28/2013  . Carotid artery disease ([place]) 02/28/2013  . Hyponatremia 02/28/2013  . Gangrenous toe ([place]) 11/21/2012  . Abnormal EKG 11/21/2012  . Peripheral arterial disease ([place]) 09/13/2012    Class: Chronic  . Pneumonitis 06/07/2012  . Dermatitis 06/07/2012  . Splenomegaly 04/13/2012  . History of blood transfusion 04/13/2012  . Lymphoma ([place]) 04/07/2012  . Neuralgia 03/16/2012  . BPH (benign prostatic hyperplasia) 03/16/2012  . Hyperlipidemia 03/16/2012  . Thrombocytopenia ([place])   . Pedal edema 02/08/2012  . Anemia 02/08/2012  . Carotid bruit 12/21/2011  . Diabetes ([place]) 12/19/2009  . Essential hypertension 12/19/2009  . CEREBROVASCULAR DISEASE 12/19/2009    Orientation RESPIRATION BLADDER Height & Weight     Self, Time, Situation, Place  O2 Continent Weight: 86.3 kg (190 lb 4.1 oz) Height:  6' (182.9 cm)  BEHAVIORAL SYMPTOMS/MOOD NEUROLOGICAL BOWEL NUTRITION STATUS  Other (Comment) (No Behaviors)   Incontinent Diet (NPO for procedure-expect to progress )  AMBULATORY STATUS COMMUNICATION OF NEEDS Skin   Limited Assist Verbally Other (Comment) (Stage 2 pressure ulcer on sacrum)                       Personal Care Assistance Level of Assistance  Bathing, Feeding, Dressing Bathing Assistance: Limited assistance Feeding assistance: Independent Dressing Assistance: Limited assistance     Functional Limitations Info  Sight, Hearing, Speech Sight Info: Adequate Hearing Info: Adequate Speech Info: Adequate    SPECIAL CARE FACTORS FREQUENCY  PT (By licensed PT), OT (By licensed OT)     PT Frequency: 5 x wk OT Frequency: 5 x wk            Contractures Contractures Info: Not present    Additional Factors Info  Code Status Code Status Info: DNR             Current Medications  (04/12/2016):  This is the current hospital active medication list Current Facility-Administered Medications  Medication Dose Route Frequency Provider Last Rate Last Dose  . acetaminophen (TYLENOL) tablet 650 mg  650 mg Oral Q6H PRN Rise Patience, MD   650 mg at 04/07/16 2021  . ampicillin (OMNIPEN) 2 g in sodium chloride 0.9 % 50 mL IVPB  2 g Intravenous Q6H Truman Hayward, MD   2 g at 04/12/16 0805  . aspirin EC tablet 81 mg  81 mg Oral Daily Rise Patience, MD   81 mg at 04/12/16 0806  . carvedilol (COREG) tablet 3.125 mg  3.125 mg Oral BID WC Erma Heritage, PA   3.125 mg at 04/12/16 0805  . cefTRIAXone (ROCEPHIN) 2 g in dextrose 5 % 50 mL IVPB  2 g Intravenous Q12H Truman Hayward, MD   2 g at 04/12/16 1011  . clopidogrel (PLAVIX) tablet 75 mg  75 mg Oral Daily Rise Patience, MD   75 mg at 04/12/16 0806  . famotidine (PEPCID) tablet 20 mg  20 mg Oral BID Florencia Reasons, MD   20 mg at 04/12/16 0806  . feeding supplement (ENSURE ENLIVE) (ENSURE ENLIVE) liquid 237 mL  237 mL Oral TID BM Rise Patience, MD   237 mL at 04/11/16 2203  . gabapentin (NEURONTIN) capsule 300 mg  300 mg Oral BID Rise Patience, MD   300 mg at 04/12/16 0806  . guaiFENesin (MUCINEX) 12 hr tablet 600 mg  600 mg Oral BID Florencia Reasons, MD   600 mg at 04/12/16 0805  . hydrOXYzine (ATARAX/VISTARIL) tablet 25 mg  25 mg Oral Q6H PRN Rise Patience, MD   25 mg at 04/08/16 2232  . insulin aspart (novoLOG) injection 0-9 Units  0-9 Units Subcutaneous TID WC Rise Patience, MD   3 Units at 04/12/16 [id number]  . ipratropium (ATROVENT) nebulizer solution 0.5 mg  0.5 mg Nebulization TID Florencia Reasons, MD   0.5 mg at 04/12/16 [id number]  . isosorbide mononitrate (IMDUR) 24 hr tablet 30 mg  30 mg Oral Daily Florencia Reasons, MD   30 mg at 04/12/16 0806  . ketotifen (ZADITOR) 0.025 % ophthalmic solution 1 drop  1 drop Right Eye BID PRN Florencia Reasons, MD   1 drop at 04/10/16 1636  . levalbuterol (XOPENEX) nebulizer solution 0.63 mg  0.63  mg Nebulization Q6H PRN Florencia Reasons, MD   0.63 mg at 04/11/16 2047  . levalbuterol (XOPENEX) nebulizer solution 1.25 mg  1.25 mg Nebulization TID Florencia Reasons, MD   1.25 mg at 04/12/16 [id number]  . menthol-cetylpyridinium (CEPACOL) lozenge 3 mg  1 lozenge Oral PRN Dianne Dun, NP   3 mg at 04/10/16 2012  . methylPREDNISolone sodium succinate (SOLU-MEDROL) 125 mg/2 mL injection 60 mg  60 mg Intravenous Q8H Florencia Reasons, MD   60 mg at 04/12/16 0807  . nitroGLYCERIN (NITROSTAT) SL tablet 0.4 mg  0.4 mg Sublingual Q5 min PRN Doreatha Lew  Hal Hope, MD      . ondansetron [facility]) injection 4 mg  4 mg Intravenous Q6H PRN Dianne Dun, NP   4 mg at 04/10/16 2255  . pravastatin (PRAVACHOL) tablet 40 mg  40 mg Oral Daily Rise Patience, MD   40 mg at 04/12/16 0806  . spironolactone (ALDACTONE) tablet 12.5 mg  12.5 mg Oral Daily Erma Heritage, [place]   12.5 mg at 04/12/16 0806  . vitamin B-12 (CYANOCOBALAMIN) tablet 1,000 mcg  1,000 mcg Oral Daily Rise Patience, MD   1,000 mcg at 04/12/16 [id number]  . zolpidem (AMBIEN) tablet 5 mg  5 mg Oral QHS PRN Dianne Dun, NP   5 mg at 04/12/16 0025     Discharge Medications: Please see discharge summary for a list of discharge medications.  Relevant Imaging Results:  Relevant Lab Results:   Additional Information SS # [id number]  Haidinger, Randall An, LCSW

## 2016-04-12 NOTE — Progress Notes (Signed)
Subjective: He reports being worried.     Objective: Vital signs in last 24 hours: Temp:  [97.6 F (36.4 C)-97.8 F (36.6 C)] 97.6 F (36.4 C) (05/08 0609) Pulse Rate:  [55-72] 55 (05/08 0609) Resp:  [18-20] 20 (05/08 0609) BP: (116-136)/(55-76) 116/55 mmHg (05/08 0609) SpO2:  [93 %-100 %] 100 % (05/08 0835) FiO2 (%):  [28 %] 28 % (05/08 0835) Last BM Date: 04/11/16  Intake/Output from previous day: 05/07 0701 - 05/08 0700 In: 480 [P.O.:480] Out: 800 [Urine:800] Intake/Output this shift: Total I/O In: -  Out: 125 [Urine:125]  Medications Scheduled Meds: . ampicillin (OMNIPEN) IV  2 g Intravenous Q6H  . aspirin EC  81 mg Oral Daily  . carvedilol  3.125 mg Oral BID WC  . cefTRIAXone (ROCEPHIN)  IV  2 g Intravenous Q12H  . clopidogrel  75 mg Oral Daily  . famotidine  20 mg Oral BID  . feeding supplement (ENSURE ENLIVE)  237 mL Oral TID BM  . gabapentin  300 mg Oral BID  . guaiFENesin  600 mg Oral BID  . insulin aspart  0-9 Units Subcutaneous TID WC  . ipratropium  0.5 mg Nebulization TID  . isosorbide mononitrate  30 mg Oral Daily  . levalbuterol  1.25 mg Nebulization TID  . methylPREDNISolone (SOLU-MEDROL) injection  60 mg Intravenous Q8H  . pravastatin  40 mg Oral Daily  . spironolactone  12.5 mg Oral Daily  . vitamin B-12  1,000 mcg Oral Daily   Continuous Infusions:  PRN Meds:.acetaminophen, hydrOXYzine, ketotifen, levalbuterol, menthol-cetylpyridinium, nitroGLYCERIN, ondansetron (ZOFRAN) IV, zolpidem  PE: General appearance: alert, cooperative and no distress Neck: no JVD Lungs: Mild wheeze.  Rhonchi cleared with cough.  Heart: irregularly irregular rhythm, no MM Extremities: No LEE Pulses: 2+ radials and 1+DPs  Skin: Warm and dry Neurologic: Grossly normal  Lab Results:   Recent Labs  04/10/16 0514  WBC 7.0  HGB 9.4*  HCT 28.2*  PLT 71*   BMET  Recent Labs  04/10/16 0514 04/11/16 0509 04/12/16 0525  NA 137 138 138  K 3.7 3.3* 4.7   CL 105 104 106  CO2 21* 25 18*  GLUCOSE 150* 133* 197*  BUN 58* 55* 49*  CREATININE 1.42* 1.25* 1.15  CALCIUM 8.7* 8.8* 8.8*   PT/INR  Recent Labs  04/10/16 0514  LABPROT 16.3*  INR 1.35     Assessment/Plan  82 yo male with PMH of HTN/HLD/Asthma/BPH/Anemia/Mantle Cell Lymphoma/CAD s/p CABG 3vd with most recent PCI 10/2014 patent DES to the VG>>PDA/Carotid stenosis/PVD/DM/COPD/s/p TAVR 2015/DVT who presented to the [facility] ED with fever/chills and admitted for SIRS on 04/07/2016.    Principal Problem:   SIRS (systemic inflammatory response syndrome) (HCC) Active Problems:   Essential hypertension   Mantle cell lymphoma (HCC)   S/P TAVR (transcatheter aortic valve replacement)   Type 2 diabetes mellitus with vascular disease (HCC)   Pressure ulcer   Ascites   Pyrexia   Sepsis (HCC)   Enterococcal sepsis (HCC)   Paroxysmal atrial fibrillation (HCC)   Wheezing   Diabetic foot ulcer associated with type 1 diabetes mellitus (HCC)   Prosthetic valve endocarditis (HCC)   Severe sepsis with septic shock (HCC)   Diabetes mellitus type 2, noninsulin dependent ([place])  1. New onset afib - Continues in Afib with controlled rate on coreg 3.125 BID. - This patients CHA2DS2-VASc Score and unadjusted Ischemic Stroke Rate (% per year) is equal to 9.7 % stroke rate/year from a score of 6 (  CHF, HTN, DM, Vascular, Age (2)). However, given his current thrombocytopenia, he is not a candidate for anticoagulation at this time. Hopefully with the resolution of his thrombocytopenia he could be started on oral anticoagulation in the future.   2. Elevated troponin - peak of 1.56, trending down - history of prior CABG, subsequent PCI to the SVG-PDA with the most recent being in 2015. - no recent chest pain.  - this occurred in setting of enterococcus bacteremia, sepsis and transient septic shock, , and afib with RVR.  - new drop in LVEF from 50-55% 11/2015 to 20-25% this admit - medical  management at this time. Would not pursue cath due to multiple comorbidities listed above as well as thrombocytopenia and mantel cell lymphoma with CT scan showing new liver lesions. He has elected for DNR status.    3. Acute systolic HF - new drop in LVEF from 50-55% 11/2015 to 20-25% this admit, along with moderate RV dysfunction - medical therapy with coreg, aldactone - no plans for invasive testing as described above. He had hyperkalemia previously on ARB.  - on lasix 20mg  IV bid.  Net fluids:  -0.3L/-1.7L.  BUN and SCr decreased from yesterday. Diuretics were held.    Appears euvolemic on exam.  4. Bacteremia - enterococcus bacteremia followed by ID - he does have history of TAVR. TTE without clear vegetation - Patient is NPO for TEE, however, with his thrombocytopenia it is not a good idea.  No room on the schedule today anyhow.      5. Ascites - paracentesis 04/08/16 with 1.2 liters removed.       LOS: 5 days "  History and all data above reviewed.  Patient examined.  I agree with the findings as above.  He feels better than on admission and might go to rehab in the AM.   The patient exam reveals COR:  Irregular  ,  Lungs: Clear  ,  Abd: Positive bowel sounds, no rebound no guarding, Ext No edema  .  All available labs, radiology testing, previous records reviewed. Agree with documented assessment and plan. Bacteremia:  It will be OK to send him home and let arrange a TEE as an out patient.  We will contact his rehab center to arrange this.  Atrial fib:  Not an anticoagulation   Lathon Adan  12:00 PM  04/12/2016    Tarri Fuller PA-C 04/12/2016 9:38 AM

## 2016-04-13 DIAGNOSIS — I5022 Chronic systolic (congestive) heart failure: Secondary | ICD-10-CM | POA: Insufficient documentation

## 2016-04-13 LAB — BASIC METABOLIC PANEL
ANION GAP: 10 (ref 5–15)
BUN: 47 mg/dL — AB (ref 4–21)
BUN: 47 mg/dL — ABNORMAL HIGH (ref 6–20)
CALCIUM: 9.1 mg/dL (ref 8.9–10.3)
CHLORIDE: 105 mmol/L (ref 101–111)
CO2: 24 mmol/L (ref 22–32)
CREATININE: 1.13 mg/dL (ref 0.61–1.24)
Creatinine: 1.1 mg/dL (ref 0.6–1.3)
GFR calc non Af Amer: 57 mL/min — ABNORMAL LOW (ref >60)
Glucose, Bld: 201 mg/dL — ABNORMAL HIGH (ref 65–99)
Glucose: 201 mg/dL
Potassium: 4.1 mmol/L (ref 3.5–5.1)
SODIUM: 139 mmol/L (ref 137–147)
SODIUM: 139 mmol/L (ref 135–145)

## 2016-04-13 LAB — CULTURE, BODY FLUID W GRAM STAIN -BOTTLE

## 2016-04-13 LAB — MAGNESIUM: MAGNESIUM: 1.9 mg/dL (ref 1.7–2.4)

## 2016-04-13 LAB — GLUCOSE, CAPILLARY
GLUCOSE-CAPILLARY: 174 mg/dL — AB (ref 65–99)
Glucose-Capillary: 248 mg/dL — ABNORMAL HIGH (ref 65–99)
Glucose-Capillary: 235 mg/dL — ABNORMAL HIGH (ref 65–99)

## 2016-04-13 LAB — CULTURE, BODY FLUID-BOTTLE: CULTURE: NO GROWTH

## 2016-04-13 MED ORDER — FAMOTIDINE 20 MG PO TABS: 20.0000 mg | ORAL_TABLET | Freq: Every day | ORAL | Status: DC

## 2016-04-13 MED ORDER — SODIUM CHLORIDE 0.9 % IV SOLN
2.0000 g | INTRAVENOUS | Status: DC
  Administered 2016-04-13 (×2): 2 g via INTRAVENOUS
  Filled 2016-04-13 (×4): qty 2000

## 2016-04-13 MED ORDER — HYDRALAZINE HCL 10 MG PO TABS: 10.0000 mg | ORAL_TABLET | Freq: Three times a day (TID) | ORAL | Status: DC

## 2016-04-13 MED ORDER — HYDRALAZINE HCL 10 MG PO TABS
10.0000 mg | ORAL_TABLET | Freq: Three times a day (TID) | ORAL | Status: DC
  Administered 2016-04-13: 10 mg via ORAL
  Filled 2016-04-13 (×2): qty 1

## 2016-04-13 MED ORDER — CARVEDILOL 3.125 MG PO TABS: 3.1250 mg | ORAL_TABLET | Freq: Two times a day (BID) | ORAL | Status: DC

## 2016-04-13 MED ORDER — PREDNISONE 10 MG PO TABS: ORAL_TABLET | ORAL | Status: DC

## 2016-04-13 MED ORDER — FUROSEMIDE 20 MG PO TABS: 20.0000 mg | ORAL_TABLET | ORAL | Status: DC | PRN

## 2016-04-13 MED ORDER — TRAMADOL HCL 50 MG PO TABS: 50.0000 mg | ORAL_TABLET | Freq: Two times a day (BID) | ORAL | Status: DC | PRN

## 2016-04-13 MED ORDER — GUAIFENESIN ER 600 MG PO TB12: 600.0000 mg | ORAL_TABLET | Freq: Two times a day (BID) | ORAL | Status: DC

## 2016-04-13 MED ORDER — DEXTROSE 5 % IV SOLN: 2.0000 g | Freq: Two times a day (BID) | INTRAVENOUS | Status: DC

## 2016-04-13 MED ORDER — SODIUM CHLORIDE 0.9 % IV SOLN: 2.0000 g | INTRAVENOUS | Status: DC

## 2016-04-13 MED ORDER — SPIRONOLACTONE 25 MG PO TABS: 12.5000 mg | ORAL_TABLET | Freq: Every day | ORAL | Status: AC

## 2016-04-13 MED ORDER — HEPARIN SOD (PORK) LOCK FLUSH 100 UNIT/ML IV SOLN
250.0000 [IU] | INTRAVENOUS | Status: AC | PRN
  Administered 2016-04-13: 250 [IU]

## 2016-04-13 NOTE — Progress Notes (Signed)
Pharmacy Antibiotic Note  [name] is a 82 y.o. male admitted on 04/06/2016 and found to have Enterococcal bacteremia with prosthetic heart valve.  ID recommends 6 weeks of ampicillin + ceftriaxone for presumed prosthetic valve endocarditis.  Pharmacy has been consulted for antibiotic renal dosing.  Plan:  Continue ceftriaxone 2g IV q12h  Increase to Ampicillin 2g IV q4h  Follow up renal fxn, culture results, and clinical course.    Height: 6' (182.9 cm) Weight: 190 lb 4.1 oz (86.3 kg) IBW/kg (Calculated) : 77.6  Temp (24hrs), Avg:97.8 F (36.6 C), Min:97.6 F (36.4 C), Max:97.9 F (36.6 C)   Recent Labs Lab 04/07/16 0421 04/07/16 0757 04/07/16 1013 04/07/16 1730 04/08/16 0322 04/09/16 [id number] 04/10/16 0514 04/11/16 0509 04/12/16 0525 04/12/16 0919 04/12/16 1332 04/13/16 0525  WBC 15.4*  --   --   --  9.0 11.1* 7.0  --   --  5.2 7.7  --   CREATININE 1.15  --   --  1.23 1.27*  --  1.42* 1.25* 1.15  --   --  1.13  LATICACIDVEN 2.4* 1.9 2.2* 1.8 1.6  --   --   --   --   --   --   --     Estimated Creatinine Clearance: 51.5 mL/min (by C-G formula based on Cr of 1.13).    Allergies  Allergen Reactions  . Losartan Other (See Comments)    Hyperkalemia > 7  . Azithromycin Itching  . Contrast Media [Iodinated Diagnostic Agents] Rash  . Doxycycline Itching       . Keflex [Cephalexin] Itching  . Macrodantin Itching  . Minocycline Hcl Itching  . Nitrofurantoin Itching  . Zithromax [Azithromycin Dihydrate] Itching    Antimicrobials this admission: Vancomycin 5/3 >> 5/3 Levofloxacin 5/3 x1 Aztreonam 5/3 >> 5/3 Cefepime >> 5/3 Unasyn 5/3 >> 5/5 Vancomycin 5/4 >>5/5 Ceftriaxone >> Ampicillin >>  Dose adjustments this admission:  Microbiology results: 5/3 BCx x2: 2/2 with Enterococcus (R - gent synergy, S - amp, vanc) 5/3 UCx (cath): >100K Enterococcus (R - levaquin, S - amp, vanc) 5/3 MRSA PCR: negative 5/4 Pleural fluid: ngtd 5/5 BCx x2: ngtd  Thank  you for allowing pharmacy to be a part of this patient's care.  Gretta Arab PharmD, BCPS Pager [phone] 04/13/2016 7:07 AM

## 2016-04-13 NOTE — Care Management Note (Signed)
Case Management Note  Patient Details  Name: DVONTE STERKEL MRN: [id number] Date of Birth: 1927-08-06  Subjective/Objective:                    Action/Plan:d/c SNF.   Expected Discharge Date:                  Expected Discharge Plan:  [place]  In-House Referral:     Discharge planning Services  CM Consult  Post Acute Care Choice:    Choice offered to:     DME Arranged:    DME Agency:     HH Arranged:    [place] Agency:     Status of Service:  Completed, signed off  Medicare Important Message Given:    Date Medicare IM Given:    Medicare IM give by:    Date Additional Medicare IM Given:    Additional Medicare Important Message give by:     If discussed at [place] of Stay Meetings, dates discussed:    Additional Comments:  Dessa Phi, RN 04/13/2016, 12:43 PM

## 2016-04-13 NOTE — Consult Note (Signed)
   [facility] Inpatient Consult   04/13/2016  [name] 08-04-1927 [id number]   Patient screened for potential [facility] Care Management services. Chart reviewed. Noted discharge plan is for SNF. Confirmed with inpatient RNCM.  There are no identifiable [facility] Care Management needs at this time. If patient's post hospital needs change, please place a [facility] Care Management consult. For questions please contact:  Marthenia Rolling, [place], RN,BSN [facility] Liaison [phone]

## 2016-04-13 NOTE — Clinical Social Work Placement (Signed)
Patient is set to discharge to [facility] SNF today. Patient, wife & daughter [name] made aware. Discharge packet given to RN, Doroteo Bradford. [facility] called for transport to pickup at 4:30pm.    Raynaldo Opitz, [place] Worker cell #: [phone]    CLINICAL SOCIAL WORK PLACEMENT  NOTE  Date:  04/13/2016  Patient Details  Name: [name] MRN: [id number] Date of Birth: [birth year]  Clinical Social Work is seeking post-discharge placement for this patient at the [place] level of care (*CSW will initial, date and re-position this form in  chart as items are completed):  No   Patient/family provided with [place] Work Department's list of facilities offering this level of care within the geographic area requested by the patient (or if unable, by the patient's family).  Yes   Patient/family informed of their freedom to choose among providers that offer the needed level of care, that participate in Medicare, Medicaid or managed care program needed by the patient, have an available bed and are willing to accept the patient.  Yes   Patient/family informed of [place]'s ownership interest in [facility] and [facility], as well as of the fact that they are under no obligation to receive care at these facilities.  PASRR submitted to EDS on       PASRR number received on       Existing PASRR number confirmed on 04/12/16     FL2 transmitted to all facilities in geographic area requested by pt/family on 04/12/16     FL2 transmitted to all facilities within larger geographic area on       Patient informed that his/her managed care company has contracts with or will negotiate with certain facilities, including the following:        Yes   Patient/family informed of bed offers received.  Patient chooses bed at [facility]     Physician recommends and patient chooses bed at      Patient to be transferred to  [facility] on 04/13/16.  Patient to be transferred to facility by [facility]     Patient family notified on 04/13/16 of transfer.  Name of family member notified:  patient's wife & daughter, [name] via phone     PHYSICIAN       Additional Comment:    _______________________________________________ Standley Brooking, LCSW 04/13/2016, 2:15 PM

## 2016-04-13 NOTE — Discharge Summary (Signed)
Discharge Summary  [name] [id number] DOB: 1927-01-08  PCP: Donnajean Lopes, MD  Admit date: 04/06/2016 Discharge date: 04/13/2016  Time spent: >57mins  Recommendations for Outpatient Follow-up:  1. F/u with PMD at SNF within a week  for hospital discharge follow up, repeat cbc/bmp at follow up 2. F/u with cardiology Dr Burt Knack within three weeks for afib/chf/TAVR 3. F/u with infectious disease in early July  4. F/u with hematology /Oncology in a month  Discharge Diagnoses:  Active Hospital Problems   Diagnosis Date Noted  . SIRS (systemic inflammatory response syndrome) ([place]) 04/07/2016  . Acute systolic heart failure ([place])   . Diabetes mellitus type 2, noninsulin dependent ([place])   . Prosthetic valve endocarditis ([place])   . Severe sepsis with septic shock ([place])   . Enterococcal sepsis ([place]) 04/08/2016  . Paroxysmal atrial fibrillation (H[facility])   . Wheezing   . Diabetic foot ulcer associated with type 1 diabetes mellitus ([place])   . Pressure ulcer 04/07/2016  . Ascites   . Pyrexia   . Sepsis ([place])   . Type 2 diabetes mellitus with vascular disease ([place]) 11/20/2014  . S/P TAVR (transcatheter aortic valve replacement) 11/05/2014  . Mantle cell lymphoma ([place]) 02/28/2013  . Essential hypertension 12/19/2009    Resolved Hospital Problems   Diagnosis Date Noted Date Resolved  No resolved problems to display.    Discharge Condition: stable  Diet recommendation: heart healthy/carb modified  Filed Weights   04/07/16 0005 04/07/16 0416 04/09/16 1304  Weight: 79.379 kg (175 lb) 80.3 kg (177 lb 0.5 oz) 86.3 kg (190 lb 4.1 oz)    History of present illness:  Chief Complaint: Fever chills.  History obtained from patient's wife.  HPI: [name] is a 82 y.o. male with medical history significant of with history of TAVR, CAD status post CABG and PCI, Mantle cell lymphoma in remission, hyperlipidemia was brought to the ER patient had fever and chills. As per patient's  wife patient has not been doing well since history morning. Patient also by the evening started having nausea vomiting. Denies any abdominal pain. Later started developing fever and chills and was brought to the ER. Patient was admitted last month for pneumonia and since discharge patient has been having persistent cough which has worsened past few days. Denies any diarrhea or dysuria. Patient also appears confused at times. CT of the head is unremarkable. Patient is admitted for SIRS/developing sepsis of unknown source.   ED Course: Patient was given fluid bolus and started on empiric antibiotics.  Hospital Course:  Principal Problem:   SIRS (systemic inflammatory response syndrome) (HCC) Active Problems:   Essential hypertension   Mantle cell lymphoma (HCC)   S/P TAVR (transcatheter aortic valve replacement)   Type 2 diabetes mellitus with vascular disease (HCC)   Pressure ulcer   Ascites   Pyrexia   Sepsis (HCC)   Enterococcal sepsis (HCC)   Paroxysmal atrial fibrillation (HCC)   Wheezing   Diabetic foot ulcer associated with type 1 diabetes mellitus (HCC)   Prosthetic valve endocarditis (HCC)   Severe sepsis with septic shock (HCC)   Diabetes mellitus type 2, noninsulin dependent ([place])   Acute systolic heart failure ([place])   Sepsis/spetic shock with enterococcus bacteremia/enterococcus UTI:  required icu consult, treated with pressor briefly on 5/3. Stress dose steroids, ivf. Broad spectrum abx.  Vital stabilized on 5/4. Leukocytosis/lactic acid normalized, patient has history of TAVR, 2DEcho no vegetation seen , repeat blood culture no growth.  Source of bacteremia?  Likely from UTI,  He did have ascites, complain of ab pain on presentation, US paracentesis on 5/4 with 226 wbc 72% neutrophil, gram stain no organism, culture no growth, cytology negative for malignancy. Ab pain has resolved. ID consulted, input appreciated, now on amp/rocephin double coverage, need total of 6  weeks to empirically cover endocarditis ( has not able to perform TEE due to thrombocytopenia), last dose of abx on 05/20/2016 detail please see infectious disease Dr Tommy Medal note on 5/8. picc line placed.  Enterococcus Bacteremia: was on vanc/cefepime, then unasyn, then amp/rocephin per ID recommendation.  Wheezing developed on 5/4 am, stat cxr no acute findings , does has chronic scarring, no significant sign of fluids overload, will d/c ivf, start xopenex/atrovent, trial dose of lasix Per patient he has been having chronic intermittent wheezing since his heart surgery in 1995, reported quit smoking 32yrs ago, he smoked 94yrs prior to that, never had lung function test. Persistent wheezing, will increase steroids to solumedrol 60tid, continue nebs/mucinex/abx wheezing has resolved with addition of steroids, taper off steroids.  Will benefit from outpatient lung function test and pulmonology referral  H/o CAD s/p CABG with stenting: patient does has Elevated troponin in the setting of sepsis, denies chest pain, no acute chest pain, he was started on heparin drip briefly in the ED, this was stopped later due to thrombocytopenia, echo with reduced ef 25-30%, he is continued on plavix/statin. Cardiology consulted, continue close follow up with cardiology  Afib: new diagnosis. CHADSvasc score 4 with dm2, age, htn. But he had thrombocytopenia, started lopressor , now on coreg, cardiology consulted. meds adjusted.  Acute Systolic CHF: ef [id number] in 11/2015, 25% this hospitalization in the setting of severe sepsis, he is currently euvolemic, on coreg and spironolactone, he si not a candidate for arb/acei due to history of hyperkalemia per cardiology. His cr increased with lasix, now lasix held, cardiology added hydralazine prior to discharge, further med adjustment per cardiology.  Thrombocytopenia, hopefully this will recover with treatment of sepsis. He does has splenomegaly. Close monitor  cbc  noninsulin dependent dm2, a1c 6.3. on ssi in the hospital. metformin held in the hospital and restarted at discharge,   HTN: bp med held on 5/3 due to septic shock, bp stabilized off pressor on 5/4.  Bp stable on coreg , imdur, spironolactone and hydralazine.  H/o mantle cell lymphoma: CT scan with new liver lesions, splenomegaly, small pleural effusion, small ascites, US guided paracentesis with 1.2liter of slightly hazy, yellow fluids, no malignant cells by pathology report, oncology input appreciated.  FTT: family report patient is weaker than usual. PT/OT, SNF  Code Status: DNR  Family Communication: patient , wife and daughter on 5/6, 5/7, 5/8, daughter over the phone on 5/9  Disposition Plan: SNF on 5/9   Consultants:  pccm  Oncology  Cardiology  ID  Procedures:  US guided paracentesis on 5/4  Antibiotics:  Vanc/cefepime on 5/3  unasyn on 5/4---5/5  Amp/rocephin from 5/5  Discharge Exam: BP 133/66 mmHg  Pulse 64  Temp(Src) 97.6 F (36.4 C) (Oral)  Resp 18  Ht 6' (1.829 m)  Wt 86.3 kg (190 lb 4.1 oz)  BMI 25.80 kg/m2  SpO2 98%   General: frail  Cardiovascular: IRRR  Respiratory:  bilateral wheezing has resolved, now more upper airway sounds   Abdomen: Soft/ND/NT, positive BS  Musculoskeletal: No Edema, 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 Likely baseline dementia   Discharge  Instructions You were cared for by a hospitalist during your hospital stay. If you have any questions about your discharge medications or the care you received while you were in the hospital after you are discharged, you can call the unit and asked to speak with the hospitalist on call if the hospitalist that took care of you is not available. Once you are discharged, your primary care physician will handle any further medical issues. Please note that NO REFILLS for any discharge medications will be authorized  once you are discharged, as it is imperative that you return to your primary care physician (or establish a relationship with a primary care physician if you do not have one) for your aftercare needs so that they can reassess your need for medications and monitor your lab values.      Discharge Instructions    Diet - low sodium heart healthy    Complete by:  As directed   Carb modified     Increase activity slowly    Complete by:  As directed             Medication List    STOP taking these medications        levofloxacin 750 MG tablet  Commonly known as:  LEVAQUIN      TAKE these medications        ampicillin 2 g in sodium chloride 0.9 % 50 mL  Inject 2 g into the vein every 4 (four) hours. Pharmacy to dose ampicillin, Last dose on 05/20/2016     ANORO ELLIPTA 62.5-25 MCG/INH Aepb  Generic drug:  umeclidinium-vilanterol  Inhale 1 puff into the lungs daily.     aspirin EC 81 MG tablet  Take 81 mg by mouth every morning.     carvedilol 3.125 MG tablet  Commonly known as:  COREG  Take 1 tablet (3.125 mg total) by mouth 2 (two) times daily with a meal.     cefTRIAXone 2 g in dextrose 5 % 50 mL  Inject 2 g into the vein every 12 (twelve) hours. End date 05/20/2016     clopidogrel 75 MG tablet  Commonly known as:  PLAVIX  TAKE ONE TABLET BY MOUTH ONCE DAILY     famotidine 20 MG tablet  Commonly known as:  PEPCID  Take 1 tablet (20 mg total) by mouth at bedtime.     feeding supplement (ENSURE ENLIVE) Liqd  Take 237 mLs by mouth 3 (three) times daily between meals.     furosemide 20 MG tablet  Commonly known as:  LASIX  Take 1 tablet (20 mg total) by mouth every other day as needed for fluid or edema.     gabapentin 300 MG capsule  Commonly known as:  NEURONTIN  Take 300 mg by mouth 2 (two) times daily.     guaiFENesin 600 MG 12 hr tablet  Commonly known as:  MUCINEX  Take 1 tablet (600 mg total) by mouth 2 (two) times daily.     hydrALAZINE 10 MG tablet   Commonly known as:  APRESOLINE  Take 1 tablet (10 mg total) by mouth every 8 (eight) hours.     hydrOXYzine 25 MG tablet  Commonly known as:  ATARAX/VISTARIL  Take 25 mg by mouth every 6 (six) hours as needed for itching.     isosorbide mononitrate 30 MG 24 hr tablet  Commonly known as:  IMDUR  TAKE ONE TABLET BY MOUTH ONCE DAILY     metFORMIN 500 MG tablet  Commonly known as:  GLUCOPHAGE  Take 500 mg by mouth 2 (two) times daily with a meal.     nitroGLYCERIN 0.4 MG SL tablet  Commonly known as:  NITROSTAT  Place 1 tablet (0.4 mg total) under the tongue every 5 (five) minutes as needed for chest pain.     pravastatin 40 MG tablet  Commonly known as:  PRAVACHOL  Take 40 mg by mouth daily.     predniSONE 10 MG tablet  Commonly known as:  DELTASONE  Label  & dispense according to the schedule below. 6Pills PO on day one, 5 Pills PO on day two, 4 Pills PO on day three, 3 Pills PO on day four, 2pills PO on day five, 1 Pill PO on day six, then STOP.     spironolactone 25 MG tablet  Commonly known as:  ALDACTONE  Take 0.5 tablets (12.5 mg total) by mouth daily.     traMADol 50 MG tablet  Commonly known as:  ULTRAM  Take 1 tablet (50 mg total) by mouth every 12 (twelve) hours as needed for moderate pain.     vitamin B-12 500 MCG tablet  Commonly known as:  CYANOCOBALAMIN  Take 1,000 mcg by mouth daily.       Allergies  Allergen Reactions  . Losartan Other (See Comments)    Hyperkalemia > 7  . Azithromycin Itching  . Contrast Media [Iodinated Diagnostic Agents] Rash  . Doxycycline Itching       . Keflex [Cephalexin] Itching  . Macrodantin Itching  . Minocycline Hcl Itching  . Nitrofurantoin Itching  . Zithromax [Azithromycin Dihydrate] Itching   Follow-up Information    Follow up with [facility]             .   Why:  early july, Fax weekly labs to [phone]   Contact information:   [address] [place]  [place] [phone]       Follow up with [facility] In 3 weeks.   Specialty:  Cardiology   Why:  afib/chf/TAVR/bacteremia, hospital discharge follow up, with Dr Barnie Mort information:   [address] [place] [phone]      Follow up with [facility], NI, MD In 1 month.   Specialty:  Hematology and Oncology   Why:  h/o mantle cell lymphoma, with new live lesions, thrombocytopenia   Contact information:   [place] [id number] [phone]        The results of significant diagnostics from this hospitalization (including imaging, microbiology, ancillary and laboratory) are listed below for reference.    Significant Diagnostic Studies: Ct Abdomen Pelvis Wo Contrast  04/07/2016  CLINICAL DATA:  Abdominal pain.  Mild diffuse abdominal tenderness. EXAM: CT CHEST, ABDOMEN AND PELVIS WITHOUT CONTRAST TECHNIQUE: Multidetector CT imaging of the chest, abdomen and pelvis was performed following the standard protocol without IV contrast. COMPARISON:  03/12/2016 FINDINGS: CT CHEST FINDINGS Mediastinum/Lymph Nodes: There is moderate cardiac enlargement. Calcification of the mitral valve noted. There is a prosthetic aortic valve. Previous median sternotomy and CABG procedure noted. The trachea appears patent and is midline. Normal appearance of the esophagus. No axillary or supraclavicular adenopathy. No mediastinal or hilar adenopathy. Lungs/Pleura: Small pleural effusions are identified bilaterally. There are bilateral lower lobe pulmonary opacities which appear stable to slightly improved from previous exam, image 77 of series 4. Musculoskeletal: No chest wall mass or  suspicious bone lesions identified. CT ABDOMEN PELVIS FINDINGS Hepatobiliary: Hypertrophy of the lateral segment of left lobe of liver and caudate lobe of liver identified. Findings are suggestive of cirrhosis. Ill defined low-attenuation lesion within  the right lobe of liver measures 1.8 cm, image 59 of series 2. This appears new from 10/08/2014 and is indeterminate. Posterior right lobe of liver low-attenuation structure measures 1.5 cm, image 65 of series 2. Also new from 10/08/2014. Gallbladder is normal. No biliary dilatation. Pancreas: No mass or inflammatory process identified on this un-enhanced exam. Spleen: The spleen is enlarged measuring 17 cm in cranial caudal dimension. Adrenals/Urinary Tract: Normal adrenal glands. Unremarkable appearance of the right kidney peer the lower pole calculus measures 6 mm, image 77 of series 2. Mild diffuse bladder wall thickening. Small diverticula arises from the right lateral wall of the bladder, image 115 of series 2 Stomach/Bowel: The stomach is normal. The small bowel loops have a normal course and caliber. There is no evidence for bowel obstruction. No pathologic dilatation of the colon. Multiple distal colonic diverticula noted. Vascular/Lymphatic: Calcified atherosclerotic disease involves the abdominal aorta. No aneurysm. No adenopathy identified within the abdomen or pelvis. Reproductive: Prostate gland and seminal vesicles are unremarkable. Other: There is moderate ascites identified within the abdomen and pelvis. No focal fluid collections identified. Musculoskeletal: Spondylosis is identified within the lumbar spine. This is most advanced at the L5-S1 level. No aggressive lytic or sclerotic bone lesions noted. IMPRESSION: 1. No findings identified to explain patient's sepsis. 2. Morphologic features a liver are concerning for cirrhosis. 3. There are 2 indeterminate low-attenuation foci within the right lobe of liver, new from 10/08/2014. More definitive characterization with contrast enhanced MRI of the liver is suggested as patient's clinical condition tolerates. 4. Splenomegaly 5. Ascites 6. Bilateral pleural effusions with mild bilateral lower lobe pneumonitis. Aeration to the lower lobes appear slightly  improved from 03/12/2016. 7. Aortic atherosclerosis 8. Lumbar spondylosis. Electronically Signed   By: Kerby Moors M.D.   On: 04/07/2016 11:58   Ct Head Wo Contrast  04/07/2016  CLINICAL DATA:  Fever, confusion, elevated white cell count. Acute encephalopathy. EXAM: CT HEAD WITHOUT CONTRAST TECHNIQUE: Contiguous axial images were obtained from the base of the skull through the vertex without intravenous contrast. COMPARISON:  07/21/2014 FINDINGS: Examination is somewhat limited by motion artifact. Diffuse cerebral atrophy. Mild ventricular dilatation consistent with central atrophy. Low-attenuation changes in the deep white matter consistent small vessel ischemia. No mass effect or midline shift. No abnormal extra-axial fluid collections. Gray-white matter junctions are distinct. Basal cisterns are not effaced. No evidence of acute intracranial hemorrhage. No depressed skull fractures. Visualized paranasal sinuses and mastoid air cells are not opacified. IMPRESSION: No acute intracranial abnormalities. Mild chronic atrophy and small vessel ischemic changes. Electronically Signed   By: Lucienne Capers M.D.   On: 04/07/2016 03:14   Ct Chest Wo Contrast  04/07/2016  CLINICAL DATA:  Abdominal pain.  Mild diffuse abdominal tenderness. EXAM: CT CHEST, ABDOMEN AND PELVIS WITHOUT CONTRAST TECHNIQUE: Multidetector CT imaging of the chest, abdomen and pelvis was performed following the standard protocol without IV contrast. COMPARISON:  03/12/2016 FINDINGS: CT CHEST FINDINGS Mediastinum/Lymph Nodes: There is moderate cardiac enlargement. Calcification of the mitral valve noted. There is a prosthetic aortic valve. Previous median sternotomy and CABG procedure noted. The trachea appears patent and is midline. Normal appearance of the esophagus. No axillary or supraclavicular adenopathy. No mediastinal or hilar adenopathy. Lungs/Pleura: Small pleural effusions are identified bilaterally. There are bilateral lower  lobe  pulmonary opacities which appear stable to slightly improved from previous exam, image 77 of series 4. Musculoskeletal: No chest wall mass or suspicious bone lesions identified. CT ABDOMEN PELVIS FINDINGS Hepatobiliary: Hypertrophy of the lateral segment of left lobe of liver and caudate lobe of liver identified. Findings are suggestive of cirrhosis. Ill defined low-attenuation lesion within the right lobe of liver measures 1.8 cm, image 59 of series 2. This appears new from 10/08/2014 and is indeterminate. Posterior right lobe of liver low-attenuation structure measures 1.5 cm, image 65 of series 2. Also new from 10/08/2014. Gallbladder is normal. No biliary dilatation. Pancreas: No mass or inflammatory process identified on this un-enhanced exam. Spleen: The spleen is enlarged measuring 17 cm in cranial caudal dimension. Adrenals/Urinary Tract: Normal adrenal glands. Unremarkable appearance of the right kidney peer the lower pole calculus measures 6 mm, image 77 of series 2. Mild diffuse bladder wall thickening. Small diverticula arises from the right lateral wall of the bladder, image 115 of series 2 Stomach/Bowel: The stomach is normal. The small bowel loops have a normal course and caliber. There is no evidence for bowel obstruction. No pathologic dilatation of the colon. Multiple distal colonic diverticula noted. Vascular/Lymphatic: Calcified atherosclerotic disease involves the abdominal aorta. No aneurysm. No adenopathy identified within the abdomen or pelvis. Reproductive: Prostate gland and seminal vesicles are unremarkable. Other: There is moderate ascites identified within the abdomen and pelvis. No focal fluid collections identified. Musculoskeletal: Spondylosis is identified within the lumbar spine. This is most advanced at the L5-S1 level. No aggressive lytic or sclerotic bone lesions noted. IMPRESSION: 1. No findings identified to explain patient's sepsis. 2. Morphologic features a liver are  concerning for cirrhosis. 3. There are 2 indeterminate low-attenuation foci within the right lobe of liver, new from 10/08/2014. More definitive characterization with contrast enhanced MRI of the liver is suggested as patient's clinical condition tolerates. 4. Splenomegaly 5. Ascites 6. Bilateral pleural effusions with mild bilateral lower lobe pneumonitis. Aeration to the lower lobes appear slightly improved from 03/12/2016. 7. Aortic atherosclerosis 8. Lumbar spondylosis. Electronically Signed   By: Kerby Moors M.D.   On: 04/07/2016 11:58   US Paracentesis  04/08/2016  INDICATION: Patient with history of sepsis/bacteremia, mantle cell lymphoma, ascites. Request made for diagnostic and therapeutic paracentesis. EXAM: ULTRASOUND GUIDED DIAGNOSTIC AND THERAPEUTIC PARACENTESIS MEDICATIONS: None. COMPLICATIONS: None immediate. PROCEDURE: Informed written consent was obtained from the patient's family after a discussion of the risks, benefits and alternatives to treatment. A timeout was performed prior to the initiation of the procedure. Initial ultrasound scanning demonstrates a small amount of ascites within the right lower abdominal quadrant. The right lower abdomen was prepped and draped in the usual sterile fashion. 1% lidocaine was used for local anesthesia. Following this, a Yueh catheter was introduced. An ultrasound image was saved for documentation purposes. The paracentesis was performed. The catheter was removed and a dressing was applied. The patient tolerated the procedure well without immediate post procedural complication. FINDINGS: A total of approximately 1.2 liters of slightly hazy, yellow fluid was removed. Samples were sent to the laboratory as requested by the clinical team. IMPRESSION: Successful ultrasound-guided diagnostic and therapeutic paracentesis yielding 1.2 liters of peritoneal fluid. Read by: Rowe Robert, PA-C Electronically Signed   By: Jacqulynn Cadet M.D.   On: 04/08/2016  11:46   Dg Chest Port 1 View  04/08/2016  CLINICAL DATA:  Wheezing, cough. EXAM: PORTABLE CHEST 1 VIEW COMPARISON:  Apr 06, 2016. FINDINGS: Stable cardiomegaly. Status post Coronary  artery bypass graft. No pneumothorax or pleural effusion is noted. Stable interstitial densities are noted throughout both lungs most consistent with scarring. Bony thorax appears intact. IMPRESSION: Stable interstitial densities are noted throughout both lungs most consistent with scarring. No significant changes noted compared to prior exam. Electronically Signed   By: Marijo Conception, M.D.   On: 04/08/2016 09:19   Dg Chest Port 1 View  04/07/2016  CLINICAL DATA:  Fever for 48 hours. EXAM: PORTABLE CHEST 1 VIEW COMPARISON:  03/12/2016 FINDINGS: There is moderate cardiomegaly and aortic tortuosity, unchanged. Prior sternotomy and CABG. No consolidation. No large effusions. Mild chronic appearing interstitial coarsening. IMPRESSION: Cardiomegaly.  No acute cardiopulmonary findings. Electronically Signed   By: Andreas Newport M.D.   On: 04/07/2016 00:34    Microbiology: Recent Results (from the past 240 hour(s))  Blood Culture (routine x 2)     Status: Abnormal   Collection Time: 04/07/16 12:13 AM  Result Value Ref Range Status   Specimen Description BLOOD RIGHT ANTECUBITAL  Final   Special Requests BOTTLES DRAWN AEROBIC AND ANAEROBIC 5CC  Final   Culture  Setup Time   Final    GRAM POSITIVE COCCI IN CHAINS IN PAIRS IN BOTH AEROBIC AND ANAEROBIC BOTTLES Organism ID to follow CRITICAL RESULT CALLED TO, READ BACK BY AND VERIFIED WITH: C SHADE PHARMD 2027 04/07/16 A BROWNING Performed at [facility] (A)  Final   Report Status 04/09/2016 FINAL  Final   Organism ID, Bacteria ENTEROCOCCUS SPECIES  Final      Susceptibility   Enterococcus species - MIC*    AMPICILLIN <=2 SENSITIVE Sensitive     VANCOMYCIN 1 SENSITIVE Sensitive     GENTAMICIN SYNERGY RESISTANT Resistant     *  ENTEROCOCCUS SPECIES  Blood Culture (routine x 2)     Status: Abnormal   Collection Time: 04/07/16 12:13 AM  Result Value Ref Range Status   Specimen Description BLOOD LEFT HAND  Final   Special Requests BOTTLES DRAWN AEROBIC AND ANAEROBIC 4CC  Final   Culture  Setup Time   Final    GRAM POSITIVE COCCI IN CHAINS IN PAIRS IN BOTH AEROBIC AND ANAEROBIC BOTTLES CRITICAL RESULT CALLED TO, READ BACK BY AND VERIFIED WITH: J. GADHIA, PHARM D AT 1000 ON [id number] BY Rhea Bleacher    Culture (A)  Final    ENTEROCOCCUS SPECIES SUSCEPTIBILITIES PERFORMED ON PREVIOUS CULTURE WITHIN THE LAST 5 DAYS. Performed at [facility]    Report Status 04/09/2016 FINAL  Final  Blood Culture ID Panel (Reflexed)     Status: Abnormal   Collection Time: 04/07/16 12:13 AM  Result Value Ref Range Status   Enterococcus species DETECTED (A) NOT DETECTED Final    Comment: CRITICAL RESULT CALLED TO, READ BACK BY AND VERIFIED WITH: C SHADE PHARMD 2027 04/07/16 A BROWNING    Vancomycin resistance NOT DETECTED NOT DETECTED Final   Listeria monocytogenes NOT DETECTED NOT DETECTED Final   Staphylococcus species NOT DETECTED NOT DETECTED Final   Staphylococcus aureus NOT DETECTED NOT DETECTED Final   Methicillin resistance NOT DETECTED NOT DETECTED Final   Streptococcus species NOT DETECTED NOT DETECTED Final   Streptococcus agalactiae NOT DETECTED NOT DETECTED Final   Streptococcus pneumoniae NOT DETECTED NOT DETECTED Final   Streptococcus pyogenes NOT DETECTED NOT DETECTED Final   Acinetobacter baumannii NOT DETECTED NOT DETECTED Final   Enterobacteriaceae species NOT DETECTED NOT DETECTED Final   Enterobacter cloacae complex NOT DETECTED NOT DETECTED  Final   Escherichia coli NOT DETECTED NOT DETECTED Final   Klebsiella oxytoca NOT DETECTED NOT DETECTED Final   Klebsiella pneumoniae NOT DETECTED NOT DETECTED Final   Proteus species NOT DETECTED NOT DETECTED Final   Serratia marcescens NOT DETECTED NOT DETECTED  Final   Carbapenem resistance NOT DETECTED NOT DETECTED Final   Haemophilus influenzae NOT DETECTED NOT DETECTED Final   Neisseria meningitidis NOT DETECTED NOT DETECTED Final   Pseudomonas aeruginosa NOT DETECTED NOT DETECTED Final   Candida albicans NOT DETECTED NOT DETECTED Final   Candida glabrata NOT DETECTED NOT DETECTED Final   Candida krusei NOT DETECTED NOT DETECTED Final   Candida parapsilosis NOT DETECTED NOT DETECTED Final   Candida tropicalis NOT DETECTED NOT DETECTED Final    Comment: Performed at [facility]  Urine culture     Status: Abnormal   Collection Time: 04/07/16 12:40 AM  Result Value Ref Range Status   Specimen Description URINE, CATHETERIZED  Final   Special Requests NONE  Final   Culture >=100,000 COLONIES/mL ENTEROCOCCUS SPECIES (A)  Final   Report Status 04/09/2016 FINAL  Final   Organism ID, Bacteria ENTEROCOCCUS SPECIES (A)  Final      Susceptibility   Enterococcus species - MIC*    AMPICILLIN <=2 SENSITIVE Sensitive     LEVOFLOXACIN >=8 RESISTANT Resistant     NITROFURANTOIN <=16 SENSITIVE Sensitive     VANCOMYCIN 1 SENSITIVE Sensitive     * >=100,000 COLONIES/mL ENTEROCOCCUS SPECIES  MRSA PCR Screening     Status: None   Collection Time: 04/07/16  7:32 AM  Result Value Ref Range Status   MRSA by PCR NEGATIVE NEGATIVE Final    Comment:        The GeneXpert MRSA Assay (FDA approved for NASAL specimens only), is one component of a comprehensive MRSA colonization surveillance program. It is not intended to diagnose MRSA infection nor to guide or monitor treatment for MRSA infections.   Culture, body fluid-bottle     Status: None (Preliminary result)   Collection Time: 04/08/16 10:11 AM  Result Value Ref Range Status   Specimen Description FLUID ASCITIC  Final   Special Requests NONE  Final   Culture   Final    NO GROWTH 4 DAYS Performed at [facility]    Report Status PENDING  Incomplete  Gram stain     Status: None    Collection Time: 04/08/16 10:11 AM  Result Value Ref Range Status   Specimen Description FLUID ASCITIC  Final   Special Requests NONE  Final   Gram Stain   Final    MODERATE WBC PRESENT,BOTH PMN AND MONONUCLEAR NO ORGANISMS SEEN Performed at [facility]    Report Status 04/08/2016 FINAL  Final  Culture, blood (Routine X 2) w Reflex to ID Panel     Status: None (Preliminary result)   Collection Time: 04/09/16 11:10 AM  Result Value Ref Range Status   Specimen Description BLOOD RIGHT HAND  Final   Special Requests IN PEDIATRIC BOTTLE .5CC  Final   Culture   Final    NO GROWTH 3 DAYS Performed at [facility]    Report Status PENDING  Incomplete  Culture, blood (Routine X 2) w Reflex to ID Panel     Status: None (Preliminary result)   Collection Time: 04/09/16 11:10 AM  Result Value Ref Range Status   Specimen Description BLOOD RIGHT ARM  Final   Special Requests BOTTLES DRAWN AEROBIC ONLY [place]  Final   Culture   Final    NO GROWTH 3 DAYS Performed at [facility]    Report Status PENDING  Incomplete     Labs: Basic Metabolic Panel:  Recent Labs Lab 04/08/16 0322 04/10/16 0514 04/11/16 0509 04/12/16 0525 04/13/16 0525  NA 130* 137 138 138 139  K 4.6 3.7 3.3* 4.7 4.1  CL 104 105 104 106 105  CO2 14* 21* 25 18* 24  GLUCOSE 171* 150* 133* 197* 201*  BUN 38* 58* 55* 49* 47*  CREATININE 1.27* 1.42* 1.25* 1.15 1.13  CALCIUM 8.3* 8.7* 8.8* 8.8* 9.1  MG  --   --   --   --  1.9   Liver Function Tests:  Recent Labs Lab 04/07/16 1730 04/08/16 0322 04/10/16 0514 04/11/16 0509 04/12/16 0525  AST 56* 62* 47* 45* 36  ALT 36 40 49 48 40  ALKPHOS 192* 200* 172* 160* 133*  BILITOT 1.7* 1.7* 1.0 0.9 0.8  PROT 4.9*  4.8* 5.1* 4.9* 5.1* 4.9*  ALBUMIN 2.9*  2.9* 3.0* 3.1* 3.1* 2.9*   No results for input(s): LIPASE, AMYLASE in the last 168 hours. No results for input(s): AMMONIA in the last 168 hours. CBC:  Recent Labs Lab 04/07/16 0013  04/07/16 0421 04/08/16 0322 04/09/16 [id number] 04/10/16 0514 04/12/16 0919 04/12/16 1332  WBC 12.9* 15.4* 9.0 11.1* 7.0 5.2 7.7  NEUTROABS 11.0* 13.1*  --   --   --   --   --   HGB 9.2* 9.4* 9.9* 9.6* 9.4* 9.9* 10.4*  HCT 27.7* 28.6* 29.7* 28.4* 28.2* 29.6* 31.6*  MCV 81.0 82.7 83.0 81.6 81.7 83.1 84.0  PLT 74* 60* 58* 59* 71* 52* 80*   Cardiac Enzymes:  Recent Labs Lab 04/07/16 0421 04/07/16 1013 04/07/16 1730 04/08/16 0322  TROPONINI 1.29* 1.27* 1.56* 1.19*   BNP: BNP (last 3 results)  Recent Labs  03/12/16 1856 04/08/16 1619  BNP 2301.3* 3365.9*    ProBNP (last 3 results) No results for input(s): PROBNP in the last 8760 hours.  CBG:  Recent Labs Lab 04/12/16 1205 04/12/16 1757 04/12/16 2049 04/13/16 0816 04/13/16 1133  GLUCAP 192* 274* 221* 248* 235*       Signed:  Isaish Alemu MD, PhD  [facility] 04/13/2016, 12:51 PM

## 2016-04-13 NOTE — Progress Notes (Signed)
Subjective: He reports breathing well.   Objective: Vital signs in last 24 hours: Temp:  [97.6 F (36.4 C)-97.9 F (36.6 C)] 97.6 F (36.4 C) (05/09 0507) Pulse Rate:  [64-72] 64 (05/09 0507) Resp:  [18] 18 (05/09 0507) BP: (133-141)/(66-77) 133/66 mmHg (05/09 0507) SpO2:  [95 %-100 %] 98 % (05/09 0811) FiO2 (%):  [28 %] 28 % (05/08 1305) Last BM Date: 04/11/16  Intake/Output from previous day: 05/08 0701 - 05/09 0700 In: 300 [IV Piggyback:300] Out: 1375 [Urine:1375] Intake/Output this shift:    Medications Scheduled Meds: . ampicillin (OMNIPEN) IV  2 g Intravenous Q4H  . aspirin EC  81 mg Oral Daily  . carvedilol  3.125 mg Oral BID WC  . cefTRIAXone (ROCEPHIN)  IV  2 g Intravenous Q12H  . clopidogrel  75 mg Oral Daily  . famotidine  20 mg Oral BID  . feeding supplement (ENSURE ENLIVE)  237 mL Oral TID BM  . gabapentin  300 mg Oral BID  . guaiFENesin  600 mg Oral BID  . insulin aspart  0-9 Units Subcutaneous TID WC  . ipratropium  0.5 mg Nebulization TID  . isosorbide mononitrate  30 mg Oral Daily  . levalbuterol  1.25 mg Nebulization TID  . methylPREDNISolone (SOLU-MEDROL) injection  60 mg Intravenous Q8H  . pravastatin  40 mg Oral Daily  . spironolactone  12.5 mg Oral Daily  . vitamin B-12  1,000 mcg Oral Daily   Continuous Infusions:  PRN Meds:.acetaminophen, hydrOXYzine, ketotifen, levalbuterol, menthol-cetylpyridinium, nitroGLYCERIN, ondansetron (ZOFRAN) IV, sodium chloride flush, zolpidem  PE: General appearance: alert, cooperative, no distress and [name] was up at the sink washing up when I entered.  He walked back to bed using his walker without difficulty.   Neck: no JVD Lungs: Diffuse rhonchi Heart: irregularly irregular rhythm and No MR Extremities: No LEE Pulses: 2+ and symmetric Skin: Warm and dry Neurologic: Grossly normal  Lab Results:   Recent Labs  04/12/16 0919 04/12/16 1332  WBC 5.2 7.7  HGB 9.9* 10.4*  HCT 29.6* 31.6*   PLT 52* 80*   BMET  Recent Labs  04/11/16 0509 04/12/16 0525 04/13/16 0525  NA 138 138 139  K 3.3* 4.7 4.1  CL 104 106 105  CO2 25 18* 24  GLUCOSE 133* 197* 201*  BUN 55* 49* 47*  CREATININE 1.25* 1.15 1.13  CALCIUM 8.8* 8.8* 9.1   PT/INR No results for input(s): LABPROT, INR in the last 72 hours. Cholesterol No results for input(s): CHOL in the last 72 hours. Cardiac Enzymes Invalid input(s): TROPONIN,  CKMB  Studies/Results: @RISRSLT2 @   Assessment/Plan    Principal Problem:   SIRS (systemic inflammatory response syndrome) (HCC) Active Problems:   Essential hypertension   Mantle cell lymphoma (HCC)   S/P TAVR (transcatheter aortic valve replacement)   Type 2 diabetes mellitus with vascular disease (HCC)   Pressure ulcer   Ascites   Pyrexia   Sepsis (HCC)   Enterococcal sepsis (HCC)   Paroxysmal atrial fibrillation (HCC)   Wheezing   Diabetic foot ulcer associated with type 1 diabetes mellitus (HCC)   Prosthetic valve endocarditis (HCC)   Severe sepsis with septic shock (HCC)   Diabetes mellitus type 2, noninsulin dependent ([place])  1. New onset afib - Continues in Afib with controlled rate on coreg 3.125 BID. - This patients CHA2DS2-VASc Score and unadjusted Ischemic Stroke Rate (% per year) is equal to 9.7 % stroke rate/year from a score of 6 (CHF, HTN, DM,  Vascular, Age (2)). However, given his current thrombocytopenia, he is not a candidate for anticoagulation at this time.   2. Elevated troponin - peak of 1.56, trending down - history of prior CABG, subsequent PCI to the SVG-PDA with the most recent being in 2015. - no recent chest pain.  - this occurred in setting of enterococcus bacteremia, sepsis and transient septic shock, , and afib with RVR.  - new drop in LVEF from 50-55% 11/2015 to 20-25% this admit - medical management at this time. Would not pursue cath due to multiple comorbidities listed above as well as thrombocytopenia and mantel cell  lymphoma with CT scan showing new liver lesions. He has elected for DNR status.    3. Acute systolic HF - new drop in LVEF from 50-55% 11/2015 to 20-25% this admit, along with moderate RV dysfunction - medical therapy with coreg, aldactone 12.5 - no plans for invasive testing as described above. He had hyperkalemia previously on ARB.  - Net fluids: -1.1L/-2.6L. BUN and SCr stable.  - Appears euvolemic on exam.  4. Bacteremia - enterococcus bacteremia followed by ID - he does have history of TAVR. TTE without clear vegetation   5. Ascites - paracentesis 04/08/16 with 1.2 liters removed.     LOS: 6 days    HAGER, BRYAN PA-C 04/13/2016   History and all data above reviewed.  Patient examined.  I agree with the findings as above. He has no acute complaints.   The patient exam reveals [id number]  ,  Lungs: Decreased breath sounds diffusely with coarse crackles  ,  Abd: Positive bowel sounds, no rebound no guarding, Ext No edema   .  All available labs, radiology testing, previous records reviewed. Agree with documented assessment and plan. Bacteremia:  The plan now with ID is not to do a TEE.  We will however, schedule his routine follow up with Dr. Burt Knack who did his TAVR.  CHF:  Seems to be euvolemic.   Continue current therapy.  We will follow up on his EF going forward but not invasive work up is planned.   I am not sure what his allergy to ARB was.  I will not start ACE/ARB.  I will add low dose hydralazine and continue low dose beta blocker.  These can be titrated for CHF management in the future.    [name]  12:12 PM  04/13/2016  9:04 AM

## 2016-04-13 NOTE — Progress Notes (Signed)
Attempt to call report to [facility] place. No answer. Message left.

## 2016-04-14 LAB — CULTURE, BLOOD (ROUTINE X 2)
CULTURE: NO GROWTH
Culture: NO GROWTH

## 2016-04-15 ENCOUNTER — Non-Acute Institutional Stay (SKILLED_NURSING_FACILITY): Payer: Medicare Other | Admitting: Adult Health

## 2016-04-15 DIAGNOSIS — R6521 Severe sepsis with septic shock: Secondary | ICD-10-CM

## 2016-04-15 DIAGNOSIS — Z954 Presence of other heart-valve replacement: Secondary | ICD-10-CM

## 2016-04-15 DIAGNOSIS — T826XXD Infection and inflammatory reaction due to cardiac valve prosthesis, subsequent encounter: Secondary | ICD-10-CM

## 2016-04-15 DIAGNOSIS — J449 Chronic obstructive pulmonary disease, unspecified: Secondary | ICD-10-CM | POA: Diagnosis not present

## 2016-04-15 DIAGNOSIS — I5022 Chronic systolic (congestive) heart failure: Secondary | ICD-10-CM

## 2016-04-15 DIAGNOSIS — I480 Paroxysmal atrial fibrillation: Secondary | ICD-10-CM | POA: Diagnosis not present

## 2016-04-15 DIAGNOSIS — A419 Sepsis, unspecified organism: Secondary | ICD-10-CM

## 2016-04-15 DIAGNOSIS — E1159 Type 2 diabetes mellitus with other circulatory complications: Secondary | ICD-10-CM | POA: Diagnosis not present

## 2016-04-15 DIAGNOSIS — E785 Hyperlipidemia, unspecified: Secondary | ICD-10-CM

## 2016-04-15 DIAGNOSIS — Z952 Presence of prosthetic heart valve: Secondary | ICD-10-CM

## 2016-04-15 DIAGNOSIS — I38 Endocarditis, valve unspecified: Secondary | ICD-10-CM

## 2016-04-15 NOTE — Progress Notes (Signed)
Patient ID: [name], male   DOB: April 02, 1927, 82 y.o.   MRN: [id number]   Location:  [place] Room Number: 417 Place of Service:  SNF (31)   CODE STATUS: Full Code  Allergies  Allergen Reactions  . Losartan Other (See Comments)    Hyperkalemia > 7  . Azithromycin Itching  . Contrast Media [Iodinated Diagnostic Agents] Rash  . Doxycycline Itching       . Keflex [Cephalexin] Itching  . Macrodantin Itching  . Minocycline Hcl Itching  . Nitrofurantoin Itching  . Zithromax [Azithromycin Dihydrate] Itching    Chief Complaint  Patient presents with  . Hospitalization Follow-up    Hospital Follow up    HPI:  He has been hospitalized for septic shock due to enterococcus bacteremia with prosthetic heart valve presumed endocarditis and uti. He has a history of TAVR; cabg; EF is 25-30%. He had a paracentesis while he was hospitalized.  He has a new diagnosis of afib. He is here of short term rehab with his goal to return back home. He does have shortness of breath present; and has had diarrheal stools.    Past Medical History  Diagnosis Date  . CAD (coronary artery disease)     a. s/p CABG 1995;  b. NSTEMI & subsequent BMS to SVG->right PDA 02/03/12.;  c. Cath 02/14/2012 3vd with 4/4 patent grafts and patent stent in vg->pda;  d. 10/2014 PCI/DES to the VG->PDA.  . Carotid stenosis     a. Carotid dopplers 4/08: RICA 14-48%, LICA 1-85%;  b. dopplers 3/14:  63-14% RICA, 9-70% LICA  . HTN (hypertension)   . HLD (hyperlipidemia)   . Asthmatic bronchitis   . BPH (benign prostatic hypertrophy)   . Herpes zoster   . Anemia   . Thrombocytopenia ([place])     a. secondary to splenomegaly related to lymphoma with suspicion of bone marrow involvement. (Plavix had to be stopped due to this)  . Aortic stenosis, severe     a. ECHO 07/22/14 Severe AS. Peak and mean gradients of 57 mmHg and 42 mmHg, respectively. Calculated AVA is 0.8-0.9 cm2. Trivial AR. Valve  area (VTI): 0.84 cm^2. Valve area (Vmax): 0.86 cm^2.. Aortic root dimension: 42 mm (ED) and aortic root mildly dilated;  b. 10/2014 S/P TAVR w/ Edwards Sapien 3 THV 55m;  c. 10/2014 Echo: EF 50%, nl fxning AoV, mean grad of 9, peak of 16.  .Marland KitchenSplenomegaly   . COPD (chronic obstructive pulmonary disease) ([facility]   . Peripheral vascular disease ([facility]     a. s/p L ray amp 10/13;  b. s/p L 2nd toe amp 12/13  . Pulmonary nodule     a. 855mRUL calcified pulm nodule noted on CT staging for lymphoma - stable 10/2012.  . Leg DVT (deep venous thromboembolism), chronic ([facility]    a. LE dopplers 5/13, 10/13 and 1/14: chronic DVT involving right mid femoral vein, left mid femoral vein, and left popliteal vein.  . Osteomyelitis ([facility]    a. L first foot ray amputation 09/2012  . Heart murmur   . Anginal pain ([facility]  . [facility]  . Pneumonia ?2014  . DM2 (diabetes mellitus, type 2) ([facility]  . DJD (degenerative joint disease)   . 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 Stage III s/p bendamustine, Rituxan therapy  . 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  29 mm Edwards Sapien 3 transcatheter heart valve placed via open right transfemoral approach    Past Surgical History  Procedure Laterality Date  . Coronary artery bypass graft  1995  . Shoulder arthroscopy w/ rotator cuff repair Right   . Carpal tunnel release Bilateral   . Incisional hernia repair    . Kidney surgery      "cut me 1/2 in 2 for stones"  . Back surgery    . I&d extremity  09/16/2012    Procedure: IRRIGATION AND DEBRIDEMENT EXTREMITY;  Surgeon: Wylene Simmer, MD;  Location: [facility];  Service: Orthopedics;  Laterality: Left;  irrigation and debridement and first Ray amputation of left foot  . Amputation  09/16/2012    Procedure: AMPUTATION RAY;  Surgeon: Wylene Simmer, MD;  Location: [facility];  Service: Orthopedics;  Laterality: Left;  first Ray amputation left foot  .  Amputation  11/21/2012    Procedure: AMPUTATION RAY;  Surgeon: Wylene Simmer, MD;  Location: [place];  Service: Orthopedics;  Laterality: Left;  LEFT SECOND TOE AMPUTATION  . I&d extremity Left 03/01/2013    Procedure: IRRIGATION AND DEBRIDEMENT EXTREMITY;  Surgeon: Wylene Simmer, MD;  Location: [facility];  Service: Orthopedics;  Laterality: Left;  IRRIGATION  AND  DEBRIDEMENT  LEFT  FOOT  . Amputation Left 12/20/2013    Procedure: AMPUTATION LEFT 5TH TRANSMETATARSAL;  Surgeon: Wylene Simmer, MD;  Location: [place];  Service: Orthopedics;  Laterality: Left;  . Achilles tendon lengthening Left 12/20/2013    Procedure: ACHILLES TENDON LENGTHENING;  Surgeon: Wylene Simmer, MD;  Location: [place];  Service: Orthopedics;  Laterality: Left;  . Hernia repair    . Coronary angioplasty with stent placement  09/2014; 10/14/2014    "?2; 1"  . Cataract extraction w/ intraocular lens  implant, bilateral Bilateral   . Transcatheter aortic valve replacement, transfemoral N/A 11/05/2014    Procedure: TRANSCATHETER AORTIC VALVE REPLACEMENT, TRANSFEMORAL;  Surgeon: [name], MD;  Location: [facility] OR; Berniece Pap 3 THV (size 29 mm, model # F048547, serial # [id number])   . Intraoperative transesophageal echocardiogram N/A 11/05/2014    Procedure: INTRAOPERATIVE TRANSESOPHAGEAL ECHOCARDIOGRAM;  Surgeon: [name], MD;  Location: [facility] OR;  Service: Open Heart Surgery;  Laterality: N/A;  . Left and right heart catheterization with coronary/graft angiogram N/A 02/03/2012    Procedure: LEFT AND RIGHT HEART CATHETERIZATION WITH Beatrix Fetters;  Surgeon: [name], MD;  Location: [facility] CATH LAB;  Service: Cardiovascular;  Laterality: N/A;  . Percutaneous stent intervention  02/03/2012    Procedure: PERCUTANEOUS STENT INTERVENTION;  Surgeon: [name], MD;  Location: [facility] CATH LAB;  Service: Cardiovascular;;  . Left heart catheterization with coronary/graft angiogram N/A 02/14/2012    Procedure: LEFT HEART CATHETERIZATION  WITH Beatrix Fetters;  Surgeon: Peter M Martinique, MD;  Location: [facility] CATH LAB;  Service: Cardiovascular;  Laterality: N/A;  . Left and right heart catheterization with coronary/graft angiogram N/A 09/23/2014    Procedure: LEFT AND RIGHT HEART CATHETERIZATION WITH Beatrix Fetters;  Surgeon: Blane Ohara, MD;  Location: [facility] CATH LAB;  Service: Cardiovascular;  Laterality: N/A;  . Percutaneous coronary stent intervention (pci-s)  10/14/2014    Procedure: PERCUTANEOUS CORONARY STENT INTERVENTION (PCI-S);  Surgeon: Blane Ohara, MD;  Location: [facility] CATH LAB;  Service: Cardiovascular;;    Social History   Social History  . Marital Status: Married    Spouse Name: N/A  . Number of Children: N/A  . Years of Education: N/A   Occupational History  . retired  [employer]   Social History Main Topics  . Smoking status: Former Smoker -- 1.00 packs/day for 30 years    Types: Cigarettes, Pipe    Quit date: 03/29/1985  . Smokeless tobacco: Former Systems developer    Types: Chew     Comment: "chewed for ~ 1 yr after I quit smoking"  . Alcohol Use: No  . Drug Use: No  . Sexual Activity: Not Currently   Other Topics Concern  . Not on file   Social History Narrative   Family History  Problem Relation Age of Onset  . Coronary artery disease    . Alcohol abuse    . Other Mother   . Other Father       VITAL SIGNS There were no vitals taken for this visit.  Patient's Medications  New Prescriptions   No medications on file  Previous Medications   AMPICILLIN 2 G IN SODIUM CHLORIDE 0.9 % 50 ML    Inject 2 g into the vein every 4 (four) hours. Pharmacy to dose ampicillin, Last dose on 05/20/2016   ANORO ELLIPTA 62.5-25 MCG/INH AEPB    Inhale 1 puff into the lungs daily.    ASPIRIN EC 81 MG TABLET    Take 81 mg by mouth every morning.    ATORVASTATIN (LIPITOR) 10 MG TABLET    Take 10 mg by mouth daily.   CARVEDILOL (COREG) 3.125 MG TABLET    Take 1 tablet (3.125 mg total) by mouth 2  (two) times daily with a meal.   CEFTRIAXONE 2 G IN DEXTROSE 5 % 50 ML    Inject 2 g into the vein every 12 (twelve) hours. End date 05/20/2016   CLOPIDOGREL (PLAVIX) 75 MG TABLET    TAKE ONE TABLET BY MOUTH ONCE DAILY   FAMOTIDINE (PEPCID) 20 MG TABLET    Take 1 tablet (20 mg total) by mouth at bedtime.   FEEDING SUPPLEMENT, ENSURE ENLIVE, (ENSURE ENLIVE) LIQD    Take 237 mLs by mouth 3 (three) times daily between meals.   FUROSEMIDE (LASIX) 20 MG TABLET    Take 1 tablet (20 mg total) by mouth every other day as needed for fluid or edema.   GABAPENTIN (NEURONTIN) 300 MG CAPSULE    Take 300 mg by mouth 2 (two) times daily.    GUAIFENESIN (MUCINEX) 600 MG 12 HR TABLET    Take 1 tablet (600 mg total) by mouth 2 (two) times daily.   HYDRALAZINE (APRESOLINE) 10 MG TABLET    Take 1 tablet (10 mg total) by mouth every 8 (eight) hours.   HYDROXYZINE (ATARAX/VISTARIL) 25 MG TABLET    Take 25 mg by mouth every 6 (six) hours as needed for itching.   ISOSORBIDE MONONITRATE (IMDUR) 30 MG 24 HR TABLET    TAKE ONE TABLET BY MOUTH ONCE DAILY   METFORMIN (GLUCOPHAGE) 500 MG TABLET    Take 500 mg by mouth 2 (two) times daily with a meal.   NITROGLYCERIN (NITROSTAT) 0.4 MG SL TABLET    Place 1 tablet (0.4 mg total) under the tongue every 5 (five) minutes as needed for chest pain.   SPIRONOLACTONE (ALDACTONE) 25 MG TABLET    Take 0.5 tablets (12.5 mg total) by mouth daily.   TRAMADOL (ULTRAM) 50 MG TABLET    Take 1 tablet (50 mg total) by mouth every 12 (twelve) hours as needed for moderate pain.   VITAMIN B-12 (CYANOCOBALAMIN) 500 MCG TABLET    Take 1,000 mcg by mouth daily.  Modified Medications   No medications on file  Discontinued Medications     SIGNIFICANT DIAGNOSTIC EXAMS  04-06-16: chest x-ray: Cardiomegaly.  No acute cardiopulmonary findings  04-07-16: ct of chest; abdomen and pelvis: 1. No findings identified to explain patient's sepsis. 2. Morphologic features a liver are concerning for  cirrhosis. 3. There are 2 indeterminate low-attenuation foci within the right lobe of liver, new from 10/08/2014. More definitive characterization with contrast enhanced MRI of the liver is suggested as patient's clinical condition tolerates. 4. Splenomegaly 5. Ascites 6. Bilateral pleural effusions with mild bilateral lower lobe pneumonitis. Aeration to the lower lobes appear slightly improved from 03/12/2016. 7. Aortic atherosclerosis 8. Lumbar spondylosis.  04-08-16: 2-d echo:   - Left ventricle: LVEF is seveely depressed with dyskinetic septal motion. The cavity size was normal. Wall thickness was normal.Systolic function was severely reduced. The estimated ejection fraction was in the range of 25% to 30%. - Aortic valve: Peak and mean gradients through AV prosthesis are 17 and 10 mm Hg respectively. s/p TAVR. There was no regurgitation. - Mitral valve: Moderately calcified, moderately thickened annulus. Mildly thickened leaflets . There was mild regurgitation. - Left atrium: The atrium was severely dilated. - Right ventricle: The cavity size was moderately dilated. Wall thickness was normal. Systolic function was moderately reduced. - Right atrium: The atrium was severely dilated. - Tricuspid valve: There was moderate regurgitation. Impressions: - Since echo from December 2016, LVEF is significantly depressed/  04-08-16: chest x-ray: Stable interstitial densities are noted throughout both lungs most consistent with scarring. No significant changes noted compared to prior exam.   04-08-16: paracentesis: Successful ultrasound-guided diagnostic and therapeutic paracentesis yielding 1.2 liters of peritoneal fluid.   LABS REVIEWED:   04-07-16; wbc 12.9; hgb 9.2; hct 27.7; mcv 81.0; plt 74; glucose 188; bun 27; creat 1.21; k+ 4.3; na++129; ast 50; alt 31; alk phos 221; total bili 1.9; albumin 3.6; blood culture: enterococcus species A;  Urine culture: enterococcus species A 04-08-16: BNP  3365.9 04-10-16: wbc 7.0; hgb 9.4; hct 28.3; mcv 81.7; plt 71; glucose 150; bun 58; creat 1.42; k+ 3.7; na++137; ast 49; alt 49; alk phos 172; total bili 1.0; albumin 3.1 04-12-16: wbc 5.2; hgb 9.9; hct 29.6; mcv 83.1; plt 52; glucose 197; bun 49; creat 1.15; k+ 4.7; na++138; ast 36; alt 40; alk phos 133; albumin 2.9 04-13-16: wbc 201; bun 47; creat 1.13; k+ 4.1; na++139; mag 1.9    Review of Systems  Constitutional: Negative for malaise/fatigue.  Respiratory: Positive for shortness of breath. Negative for cough.        Is 02 dependent   Cardiovascular: Positive for leg swelling. Negative for chest pain and palpitations.  Gastrointestinal: Positive for diarrhea. Negative for heartburn, abdominal pain and constipation.  Musculoskeletal: Negative for myalgias, back pain and joint pain.  Skin: Negative.   Neurological: Negative for dizziness.  Psychiatric/Behavioral: The patient is not nervous/anxious.     Physical Exam  Constitutional: He is oriented to person, place, and time. No distress.  Eyes: Conjunctivae are normal.  Neck: Neck supple. No JVD present. No thyromegaly present.  Cardiovascular: Normal rate, regular rhythm and intact distal pulses.   Respiratory: Effort normal. No respiratory distress. He has no wheezes.  Scattered rhonchi and rales 02 dependent   GI: Soft. Bowel sounds are normal. He exhibits distension. There is no tenderness.  Slight abdominal distension present.   Musculoskeletal: He exhibits edema.  Able to move all extremities  2+ lower extremity edema   Lymphadenopathy:  He has no cervical adenopathy.  Neurological: He is alert and oriented to person, place, and time.  Skin: Skin is warm and dry. He is not diaphoretic.  Psychiatric: He has a normal mood and affect.     ASSESSMENT/ PLAN:  1. Chronic systolic heart failure: EF 25-30%; which is significantly declined from Dec 2016; will continue lasix 20 mg every other day; apresoline 10 mg every 8 hours; ;  aldactone 12.5 mg daily  Will begin ted hose daily for his edema   2. Afib: heart rate controlled; will continue asa 81 mg daily plavix 75 mg daily ;will continue coreg 3.125 mg twice daily for rate control   3. CAD: is status post cabg; pci; no complaints of chest pain present; will continue asa 81 mg daily plavix 75 mg daily; imdur 30 mg daily and prn ntg  4. Peripheral neuropathy: will continue neurontin 300 mg twice daily has ultram 50 mg twice daily as needed  5. Gerd: will continue pepcid 20 mg daily  6. Bacteremia with TVAR presumed endocarditis from enterococcal species A.; UTI: will continue ampicillin IV and ceftriaxone IV through 05-20-16.   7. COPD: is 02 dependent; will continue anoro ellipta 62.5-25 mcg 1 puff daily will begin duonebs every 6 hours   8. Diarrhea: due to his use of abt; will collect specimen for c-diff and will begin treatment with flagyl 500 mg three times daily for 10 days pending test results  9. Dyslipidemia: will continue lipitor 10 mg daily   10. Thrombocytopenia: is due to splenomegaly; will monitor    Time spent with patient  50  minutes >50% time spent counseling; reviewing medical record; tests; labs; and developing future plan of care       Ok Edwards NP [facility]  Contact [phone] Monday through Friday 8am- 5pm  After hours call [phone]

## 2016-04-19 ENCOUNTER — Non-Acute Institutional Stay (SKILLED_NURSING_FACILITY): Payer: Medicare Other | Admitting: Internal Medicine

## 2016-04-19 ENCOUNTER — Encounter: Payer: Self-pay | Admitting: Internal Medicine

## 2016-04-19 DIAGNOSIS — I10 Essential (primary) hypertension: Secondary | ICD-10-CM

## 2016-04-19 DIAGNOSIS — R600 Localized edema: Secondary | ICD-10-CM

## 2016-04-19 DIAGNOSIS — E1159 Type 2 diabetes mellitus with other circulatory complications: Secondary | ICD-10-CM | POA: Diagnosis not present

## 2016-04-19 DIAGNOSIS — I480 Paroxysmal atrial fibrillation: Secondary | ICD-10-CM | POA: Diagnosis not present

## 2016-04-19 DIAGNOSIS — R5381 Other malaise: Secondary | ICD-10-CM

## 2016-04-19 DIAGNOSIS — Z954 Presence of other heart-valve replacement: Secondary | ICD-10-CM | POA: Diagnosis not present

## 2016-04-19 DIAGNOSIS — T826XXD Infection and inflammatory reaction due to cardiac valve prosthesis, subsequent encounter: Secondary | ICD-10-CM

## 2016-04-19 DIAGNOSIS — D649 Anemia, unspecified: Secondary | ICD-10-CM | POA: Diagnosis not present

## 2016-04-19 DIAGNOSIS — Z952 Presence of prosthetic heart valve: Secondary | ICD-10-CM

## 2016-04-19 DIAGNOSIS — J449 Chronic obstructive pulmonary disease, unspecified: Secondary | ICD-10-CM | POA: Diagnosis not present

## 2016-04-19 DIAGNOSIS — C8310 Mantle cell lymphoma, unspecified site: Secondary | ICD-10-CM

## 2016-04-19 DIAGNOSIS — I5022 Chronic systolic (congestive) heart failure: Secondary | ICD-10-CM | POA: Diagnosis not present

## 2016-04-19 DIAGNOSIS — I739 Peripheral vascular disease, unspecified: Secondary | ICD-10-CM | POA: Diagnosis not present

## 2016-04-19 DIAGNOSIS — I38 Endocarditis, valve unspecified: Secondary | ICD-10-CM

## 2016-04-19 LAB — BASIC METABOLIC PANEL
BUN: 17 mg/dL (ref 4–21)
CREATININE: 1 mg/dL (ref 0.6–1.3)
GLUCOSE: 106 mg/dL
POTASSIUM: 3.5 mmol/L (ref 3.4–5.3)
SODIUM: 136 mmol/L — AB (ref 137–147)

## 2016-04-19 LAB — CBC AND DIFFERENTIAL
HCT: 28 % — AB (ref 41–53)
Hemoglobin: 8.8 g/dL — AB (ref 13.5–17.5)
Platelets: 50 10*3/uL — AB (ref 150–399)
WBC: 6.4 10^3/mL

## 2016-04-19 LAB — HEPATIC FUNCTION PANEL
ALT: 14 U/L (ref 10–40)
AST: 19 U/L (ref 14–40)
Alkaline Phosphatase: 113 U/L (ref 25–125)
Bilirubin, Total: 0.8 mg/dL

## 2016-04-19 NOTE — Progress Notes (Signed)
Patient ID: [name], male   DOB: 02/15/1927, 82 y.o.   MRN: [id number]     Location:  [facility] Room Number: [id number] of Service:  SNF (31)  PCP: Blanchie Serve, MD Patient Care Team: Blanchie Serve, MD as PCP - General (Internal Medicine)  Extended Emergency Contact Information Primary Emergency Contact: Forestine Chute Address: [address]          [place], [place] [address] [place] Phone: [phone] Mobile Phone: [phone] Relation: Spouse Secondary Emergency Contact: Brownfield,Cindy Address: [address]          [place], [place] [address] [name] of [place] Phone: [phone] Mobile Phone: [phone] Relation: Daughter  Code Status: DNR Goals of Care: Advanced Directive information Advanced Directives 04/19/2016  Does patient have an advance directive? Yes  Type of Advance Directive Out of facility DNR (pink MOST or yellow form)  Copy of advanced directive(s) in chart? Yes  Pre-existing out of facility DNR order (yellow form or pink MOST form) Yellow form placed in chart (order not valid for inpatient use)      Chief Complaint  Patient presents with  . New Admit To SNF    following hospitalization 04/06/16 to 04/13/16 SIRS    HPI: Patient is a 82 y.o. male seen today for admission to [facility]  04/13/2016 following hospitalization from 04/06/2016 through 04/13/2016. Patient was hospitalized with SIRS syndrome due to sepsis with septic shock. 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 he is currently taking Rocephin 2 g intravenously every 12 hours until 05/20/2016.   Other noncardiac issues include acute systolic heart failure while in the hospital, paroxysmal atrial fibrillation which appeared to be a new diagnosis during his last admission, and edema of the legs.  Patient has known diabetes mellitus with vascular  disease. He also had a diabetic foot ulcer of the left foot treated 2012. He ultimately had left transmetatarsal amputation by Dr. Suszanne Conners in 2012.  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 These seem to be improved at present time.  He has CKD 3.  Other physicians have diagnosed ascites.  He has a pressure ulcer which is shallow with sacrum.  Due to his debility from recent illnesses, he is admitted for short-term rehabilitation stay to gain strength, safely mobilize, and improved self-care skills. He will continue his intravenous Rocephin while in the skilled nursing facility.  Past Medical History  Diagnosis Date  . CAD (coronary artery disease)     a. s/p CABG 1995;  b. NSTEMI & subsequent BMS to SVG->right PDA 02/03/12.;  c. Cath 02/14/2012 3vd with 4/4 patent grafts and patent stent in vg->pda;  d. 10/2014 PCI/DES to the VG->PDA.  . Carotid stenosis     a. Carotid dopplers [id number]: RICA [id number], LICA [id number];  b. dopplers 3/14:  [id number] RICA, [id number] LICA  . HTN (hypertension)   . HLD (hyperlipidemia)   . Asthmatic bronchitis   . BPH (benign prostatic hypertrophy)   . Herpes zoster   . Anemia   . Thrombocytopenia ([place])     a. secondary to splenomegaly related to lymphoma with suspicion of bone marrow involvement. (Plavix had to be stopped due to this)  . Aortic stenosis, severe     a. ECHO 07/22/14 Severe AS. Peak and mean  gradients of 57 mmHg and 42 mmHg, respectively. Calculated AVA is 0.8-0.9 cm2. Trivial AR. Valve area (VTI): 0.84 cm^2. Valve area (Vmax): 0.86 cm^2.. Aortic root dimension: 42 mm (ED) and aortic root mildly dilated;  b. 10/2014 S/P TAVR w/ Edwards Sapien 3 THV 81mm;  c. 10/2014 Echo: EF 50%, nl fxning AoV, mean grad of 9, peak of 16.  Marland Kitchen Splenomegaly   . COPD (chronic obstructive pulmonary disease) ([place])   . Peripheral vascular disease ([place])     a.  s/p L ray amp 10/13;  b. s/p L 2nd toe amp 12/13  . Pulmonary nodule     a. 39mm RUL calcified pulm nodule noted on CT staging for lymphoma - stable 10/2012.  . Leg DVT (deep venous thromboembolism), chronic ([place])     a. LE dopplers 5/13, 10/13 and 1/14: chronic DVT involving right mid femoral vein, left mid femoral vein, and left popliteal vein.  . Osteomyelitis ([place])     a. L first foot ray amputation 09/2012  . Heart murmur   . Anginal pain ([place])   . Myocardial infarction [facility]) ?1995  . Pneumonia ?2014  . DM2 (diabetes mellitus, type 2) ([place])   . DJD (degenerative joint disease)   . 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 Stage III s/p bendamustine, Rituxan therapy  . 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 29 mm Edwards Sapien 3 transcatheter heart valve placed via open right transfemoral approach   Past Surgical History  Procedure Laterality Date  . Coronary artery bypass graft  1995  . Shoulder arthroscopy w/ rotator cuff repair Right   . Carpal tunnel release Bilateral   . Incisional hernia repair    . Kidney surgery      "cut me 1/2 in 2 for stones"  . Back surgery    . I&d extremity  09/16/2012    Procedure: IRRIGATION AND DEBRIDEMENT EXTREMITY;  Surgeon: Wylene Simmer, MD;  Location: [facility];  Service: Orthopedics;  Laterality: Left;  irrigation and debridement and first Ray amputation of left foot  . Amputation  09/16/2012    Procedure: AMPUTATION RAY;  Surgeon: Wylene Simmer, MD;  Location: [facility];  Service: Orthopedics;  Laterality: Left;  first Ray amputation left foot  . Amputation  11/21/2012    Procedure: AMPUTATION RAY;  Surgeon: Wylene Simmer, MD;  Location: [facility];  Service: Orthopedics;  Laterality: Left;  LEFT SECOND TOE AMPUTATION  . I&d extremity Left 03/01/2013    Procedure: IRRIGATION AND DEBRIDEMENT EXTREMITY;  Surgeon: Wylene Simmer, MD;  Location: [facility];  Service: Orthopedics;  Laterality: Left;  IRRIGATION  AND   DEBRIDEMENT  LEFT  FOOT  . Amputation Left 12/20/2013    Procedure: AMPUTATION LEFT 5TH TRANSMETATARSAL;  Surgeon: Wylene Simmer, MD;  Location: [facility];  Service: Orthopedics;  Laterality: Left;  . Achilles tendon lengthening Left 12/20/2013    Procedure: ACHILLES TENDON LENGTHENING;  Surgeon: Wylene Simmer, MD;  Location: [facility];  Service: Orthopedics;  Laterality: Left;  . Hernia repair    . Coronary angioplasty with stent placement  09/2014; 10/14/2014    "?2; 1"  . Cataract extraction w/ intraocular lens  implant, bilateral Bilateral   . Transcatheter aortic valve replacement, transfemoral N/A 11/05/2014    Procedure: TRANSCATHETER AORTIC VALVE REPLACEMENT, TRANSFEMORAL;  Surgeon: [name], MD;  Location: [facility] OR; Berniece Pap 3 THV (size 29 mm, model # [id number], serial # [id number])   . Intraoperative transesophageal  echocardiogram N/A 11/05/2014    Procedure: INTRAOPERATIVE TRANSESOPHAGEAL ECHOCARDIOGRAM;  Surgeon: [name], MD;  Location: [facility];  Service: Open Heart Surgery;  Laterality: N/A;  . Left and right heart catheterization with coronary/graft angiogram N/A 02/03/2012    Procedure: LEFT AND RIGHT HEART CATHETERIZATION WITH Beatrix Fetters;  Surgeon: [name], MD;  Location: [facility] CATH LAB;  Service: Cardiovascular;  Laterality: N/A;  . Percutaneous stent intervention  02/03/2012    Procedure: PERCUTANEOUS STENT INTERVENTION;  Surgeon: [name], MD;  Location: [facility] CATH LAB;  Service: Cardiovascular;;  . Left heart catheterization with coronary/graft angiogram N/A 02/14/2012    Procedure: LEFT HEART CATHETERIZATION WITH Beatrix Fetters;  Surgeon: Peter M Martinique, MD;  Location: [facility] CATH LAB;  Service: Cardiovascular;  Laterality: N/A;  . Left and right heart catheterization with coronary/graft angiogram N/A 09/23/2014    Procedure: LEFT AND RIGHT HEART CATHETERIZATION WITH Beatrix Fetters;  Surgeon: Blane Ohara, MD;  Location: [facility] CATH LAB;  Service:  Cardiovascular;  Laterality: N/A;  . Percutaneous coronary stent intervention (pci-s)  10/14/2014    Procedure: PERCUTANEOUS CORONARY STENT INTERVENTION (PCI-S);  Surgeon: Blane Ohara, MD;  Location: [facility] CATH LAB;  Service: Cardiovascular;;    reports that he quit smoking about 31 years ago. His smoking use included Cigarettes and Pipe. He has a 30 pack-year smoking history. He has quit using smokeless tobacco. His smokeless tobacco use included Chew. He reports that he does not drink alcohol or use illicit drugs. Social History   Social History  . Marital Status: Married    Spouse Name: N/A  . Number of Children: N/A  . Years of Education: N/A   Occupational History  . retired [employer]   Social History Main Topics  . Smoking status: Former Smoker -- 1.00 packs/day for 30 years    Types: Cigarettes, Pipe    Quit date: 03/29/1985  . Smokeless tobacco: Former Systems developer    Types: Chew     Comment: "chewed for ~ 1 yr after I quit smoking"  . Alcohol Use: No  . Drug Use: No  . Sexual Activity: Not Currently   Other Topics Concern  . Not on file   Social History Narrative   Admitted to [facility] 04/13/16   Married   Former smoker-stopped 1986   Alcohol none   DNR    Functional Status Survey:    Family History  Problem Relation Age of Onset  . Coronary artery disease    . Alcohol abuse    . Other Mother   . Other Father     Health Maintenance  Topic Date Due  . FOOT EXAM  08/20/1939  . OPHTHALMOLOGY EXAM  08/20/1939  . URINE MICROALBUMIN  08/20/1939  . ZOSTAVAX  04/15/2017 (Originally 08/19/1989)  . TETANUS/TDAP  04/15/2017 (Originally 08/19/1948)  . PNA vac Low Risk Adult (1 of 2 - PCV13) 04/15/2017 (Originally 08/19/1994)  . INFLUENZA VACCINE  07/06/2016  . HEMOGLOBIN A1C  09/12/2016    Allergies  Allergen Reactions  . Losartan Other (See Comments)    Hyperkalemia > 7  . Azithromycin Itching  . Contrast Media [Iodinated Diagnostic Agents] Rash  .  Doxycycline Itching       . Keflex [Cephalexin] Itching  . Macrodantin Itching  . Minocycline Hcl Itching  . Nitrofurantoin Itching  . Zithromax [Azithromycin Dihydrate] Itching      Medication List       This list is accurate as of: 04/19/16  5:51 PM.  Always use  your most recent med list.               ampicillin 2 g in sodium chloride 0.9 % 50 mL  Inject 2 g into the vein every 4 (four) hours. Pharmacy to dose ampicillin, Last dose on 05/20/2016     ANORO ELLIPTA 62.5-25 MCG/INH Aepb  Generic drug:  umeclidinium-vilanterol  Inhale 1 puff into the lungs daily.     aspirin EC 81 MG tablet  Take 81 mg by mouth every morning.     atorvastatin 10 MG tablet  Commonly known as:  LIPITOR  Take 10 mg by mouth daily.     carvedilol 3.125 MG tablet  Commonly known as:  COREG  Take 1 tablet (3.125 mg total) by mouth 2 (two) times daily with a meal.     cefTRIAXone 2 g in dextrose 5 % 50 mL  Inject 2 g into the vein every 12 (twelve) hours. End date 05/20/2016     clopidogrel 75 MG tablet  Commonly known as:  PLAVIX  TAKE ONE TABLET BY MOUTH ONCE DAILY     famotidine 20 MG tablet  Commonly known as:  PEPCID  Take 1 tablet (20 mg total) by mouth at bedtime.     feeding supplement (ENSURE ENLIVE) Liqd  Take 237 mLs by mouth 3 (three) times daily between meals.     furosemide 20 MG tablet  Commonly known as:  LASIX  Take 1 tablet (20 mg total) by mouth every other day as needed for fluid or edema.     gabapentin 300 MG capsule  Commonly known as:  NEURONTIN  Take 300 mg by mouth 2 (two) times daily.     guaiFENesin 600 MG 12 hr tablet  Commonly known as:  MUCINEX  Take 1 tablet (600 mg total) by mouth 2 (two) times daily.     hydrALAZINE 10 MG tablet  Commonly known as:  APRESOLINE  Take 1 tablet (10 mg total) by mouth every 8 (eight) hours.     hydrOXYzine 25 MG tablet  Commonly known as:  ATARAX/VISTARIL  Take 25 mg by mouth every 6 (six) hours as needed for  itching.     isosorbide mononitrate 30 MG 24 hr tablet  Commonly known as:  IMDUR  TAKE ONE TABLET BY MOUTH ONCE DAILY     metFORMIN 500 MG tablet  Commonly known as:  GLUCOPHAGE  Take 500 mg by mouth 2 (two) times daily with a meal.     nitroGLYCERIN 0.4 MG SL tablet  Commonly known as:  NITROSTAT  Place 1 tablet (0.4 mg total) under the tongue every 5 (five) minutes as needed for chest pain.     saccharomyces boulardii 250 MG capsule  Commonly known as:  FLORASTOR  Take 250 mg by mouth. Take one capsule twice daily for 10 days     spironolactone 25 MG tablet  Commonly known as:  ALDACTONE  Take 0.5 tablets (12.5 mg total) by mouth daily.     traMADol 50 MG tablet  Commonly known as:  ULTRAM  Take 1 tablet (50 mg total) by mouth every 12 (twelve) hours as needed for moderate pain.     vitamin B-12 500 MCG tablet  Commonly known as:  CYANOCOBALAMIN  Take 1,000 mcg by mouth daily.        Review of Systems  Constitutional: Positive for fatigue. Negative for fever, activity change, appetite change and unexpected weight change.  HENT: Negative for congestion, ear pain,  hearing loss, rhinorrhea, sore throat, tinnitus, trouble swallowing and voice change.   Eyes: Negative.   Respiratory: Negative for cough, choking, chest tightness, shortness of breath and wheezing.        Community-acquired pneumonia in April 2017  Cardiovascular: Positive for leg swelling. Negative for chest pain and palpitations.       Acute systolic heart failure in May 2017. History of paroxysmal atrial fibrillation. History TAVR 11/05/2014. History of endocarditis of prosthetic aortic valve May 2017.  Gastrointestinal: Negative for nausea, abdominal pain, diarrhea, constipation and abdominal distention.  Endocrine: Negative for cold intolerance, heat intolerance, polydipsia, polyphagia and polyuria.       Diabetes mellitus with CKD 3 and a previous history of diabetic foot ulcer which resulted in  transmetatarsal amputation of the left foot.  Genitourinary: Negative for dysuria, urgency, frequency and testicular pain.       Not incontinent  Musculoskeletal: Negative for myalgias, back pain, arthralgias, gait problem and neck pain.       Transmetatarsal amputation at the left foot.  Skin: Negative for color change, pallor and rash.       History of shallow pressure ulcer with sacrum.  Allergic/Immunologic: Negative.   Neurological: Negative for dizziness, tremors, syncope, speech difficulty, weakness, numbness and headaches.       Acute encephalopathy during his last hospitalization. Possible pre-existing mild dementia.  Hematological: Negative for adenopathy. Does not bruise/bleed easily.       History of Mantle cell lymphoma, currently in remission.  Psychiatric/Behavioral: Negative for hallucinations, behavioral problems, confusion, sleep disturbance and decreased concentration. The patient is not nervous/anxious.     Filed Vitals:   04/19/16 1743  BP: 134/65  Pulse: 64  Temp: 96 F (35.6 C)  Resp: 20  Height: 6' (1.829 m)  Weight: 190 lb (86.183 kg)  SpO2: 98%   Body mass index is 25.76 kg/(m^2). Physical Exam  Constitutional: He is oriented to person, place, and time. He appears well-developed and well-nourished. No distress.  HENT:  Right Ear: External ear normal.  Left Ear: External ear normal.  Nose: Nose normal.  Mouth/Throat: Oropharynx is clear and moist. No oropharyngeal exudate.  Eyes: Conjunctivae and EOM are normal. Pupils are equal, round, and reactive to light.  Neck: No JVD present. No tracheal deviation present. No thyromegaly present.  Cardiovascular: Normal rate, regular rhythm and normal heart sounds.  Exam reveals no gallop and no friction rub.   No murmur heard. Diminished DP and PT bilaterally  Pulmonary/Chest: No respiratory distress. He has no wheezes. He has no rales. He exhibits no tenderness.  Abdominal: He exhibits distension. He exhibits  no mass. There is no tenderness.  Musculoskeletal: Normal range of motion. He exhibits edema (2+ bipedal). He exhibits no tenderness.  Lymphadenopathy:    He has no cervical adenopathy.  Neurological: He is alert and oriented to person, place, and time. He has normal reflexes. No cranial nerve deficit. Coordination normal.  Diminished sensation in feet bilaterally  Skin: No rash noted. No erythema. No pallor.  Shallow sacral decubitus  Psychiatric: He has a normal mood and affect. His behavior is normal. Judgment and thought content normal.    Labs reviewed: Basic Metabolic Panel:  Recent Labs  04/11/16 0509 04/12/16 0525 04/13/16 0525  NA 138 138 139  K 3.3* 4.7 4.1  CL 104 106 105  CO2 25 18* 24  GLUCOSE 133* 197* 201*  BUN 55* 49* 47*  CREATININE 1.25* 1.15 1.13  CALCIUM 8.8* 8.8* 9.1  MG  --   --  1.9   Liver Function Tests:  Recent Labs  04/10/16 0514 04/11/16 0509 04/12/16 0525  AST 47* 45* 36  ALT 49 48 40  ALKPHOS 172* 160* 133*  BILITOT 1.0 0.9 0.8  PROT 4.9* 5.1* 4.9*  ALBUMIN 3.1* 3.1* 2.9*    Recent Labs  03/12/16 2114  LIPASE 25   No results for input(s): AMMONIA in the last 8760 hours. CBC:  Recent Labs  04/07/16 0013 04/07/16 0421  04/10/16 0514 04/12/16 0919 04/12/16 1332  WBC 12.9* 15.4*  < > 7.0 5.2 7.7  NEUTROABS 11.0* 13.1*  --   --   --   --   HGB 9.2* 9.4*  < > 9.4* 9.9* 10.4*  HCT 27.7* 28.6*  < > 28.2* 29.6* 31.6*  MCV 81.0 82.7  < > 81.7 83.1 84.0  PLT 74* 60*  < > 71* 52* 80*  < > = values in this interval not displayed. Cardiac Enzymes:  Recent Labs  04/07/16 1013 04/07/16 1730 04/08/16 0322  TROPONINI 1.27* 1.56* 1.19*   BNP: Invalid input(s): POCBNP Lab Results  Component Value Date   HGBA1C 6.3* 03/13/2016   Lab Results  Component Value Date   TSH 1.290 02/02/2012   Lab Results  Component Value Date   VITAMINB12 273 03/16/2012   Lab Results  Component Value Date   FOLATE >20.0 03/16/2012   Lab  Results  Component Value Date   IRON 47 03/16/2012   TIBC 274 03/16/2012   FERRITIN 127 03/16/2012    Imaging and Procedures obtained prior to SNF admission: Ct Abdomen Pelvis Wo Contrast  04/07/2016  CLINICAL DATA:  Abdominal pain.  Mild diffuse abdominal tenderness. EXAM: CT CHEST, ABDOMEN AND PELVIS WITHOUT CONTRAST TECHNIQUE: Multidetector CT imaging of the chest, abdomen and pelvis was performed following the standard protocol without IV contrast. COMPARISON:  03/12/2016 FINDINGS: CT CHEST FINDINGS Mediastinum/Lymph Nodes: There is moderate cardiac enlargement. Calcification of the mitral valve noted. There is a prosthetic aortic valve. Previous median sternotomy and CABG procedure noted. The trachea appears patent and is midline. Normal appearance of the esophagus. No axillary or supraclavicular adenopathy. No mediastinal or hilar adenopathy. Lungs/Pleura: Small pleural effusions are identified bilaterally. There are bilateral lower lobe pulmonary opacities which appear stable to slightly improved from previous exam, image 77 of series 4. Musculoskeletal: No chest wall mass or suspicious bone lesions identified. CT ABDOMEN PELVIS FINDINGS Hepatobiliary: Hypertrophy of the lateral segment of left lobe of liver and caudate lobe of liver identified. Findings are suggestive of cirrhosis. Ill defined low-attenuation lesion within the right lobe of liver measures 1.8 cm, image 59 of series 2. This appears new from 10/08/2014 and is indeterminate. Posterior right lobe of liver low-attenuation structure measures 1.5 cm, image 65 of series 2. Also new from 10/08/2014. Gallbladder is normal. No biliary dilatation. Pancreas: No mass or inflammatory process identified on this un-enhanced exam. Spleen: The spleen is enlarged measuring 17 cm in cranial caudal dimension. Adrenals/Urinary Tract: Normal adrenal glands. Unremarkable appearance of the right kidney peer the lower pole calculus measures 6 mm, image 77 of  series 2. Mild diffuse bladder wall thickening. Small diverticula arises from the right lateral wall of the bladder, image 115 of series 2 Stomach/Bowel: The stomach is normal. The small bowel loops have a normal course and caliber. There is no evidence for bowel obstruction. No pathologic dilatation of the colon. Multiple distal colonic diverticula noted. Vascular/Lymphatic: Calcified atherosclerotic disease involves the abdominal aorta. No aneurysm. No adenopathy identified within  the abdomen or pelvis. Reproductive: Prostate gland and seminal vesicles are unremarkable. Other: There is moderate ascites identified within the abdomen and pelvis. No focal fluid collections identified. Musculoskeletal: Spondylosis is identified within the lumbar spine. This is most advanced at the L5-S1 level. No aggressive lytic or sclerotic bone lesions noted. IMPRESSION: 1. No findings identified to explain patient's sepsis. 2. Morphologic features a liver are concerning for cirrhosis. 3. There are 2 indeterminate low-attenuation foci within the right lobe of liver, new from 10/08/2014. More definitive characterization with contrast enhanced MRI of the liver is suggested as patient's clinical condition tolerates. 4. Splenomegaly 5. Ascites 6. Bilateral pleural effusions with mild bilateral lower lobe pneumonitis. Aeration to the lower lobes appear slightly improved from 03/12/2016. 7. Aortic atherosclerosis 8. Lumbar spondylosis. Electronically Signed   By: Kerby Moors M.D.   On: 04/07/2016 11:58   Ct Head Wo Contrast  04/07/2016  CLINICAL DATA:  Fever, confusion, elevated white cell count. Acute encephalopathy. EXAM: CT HEAD WITHOUT CONTRAST TECHNIQUE: Contiguous axial images were obtained from the base of the skull through the vertex without intravenous contrast. COMPARISON:  07/21/2014 FINDINGS: Examination is somewhat limited by motion artifact. Diffuse cerebral atrophy. Mild ventricular dilatation consistent with central  atrophy. Low-attenuation changes in the deep white matter consistent small vessel ischemia. No mass effect or midline shift. No abnormal extra-axial fluid collections. Gray-white matter junctions are distinct. Basal cisterns are not effaced. No evidence of acute intracranial hemorrhage. No depressed skull fractures. Visualized paranasal sinuses and mastoid air cells are not opacified. IMPRESSION: No acute intracranial abnormalities. Mild chronic atrophy and small vessel ischemic changes. Electronically Signed   By: Lucienne Capers M.D.   On: 04/07/2016 03:14   Ct Chest Wo Contrast  04/07/2016  CLINICAL DATA:  Abdominal pain.  Mild diffuse abdominal tenderness. EXAM: CT CHEST, ABDOMEN AND PELVIS WITHOUT CONTRAST TECHNIQUE: Multidetector CT imaging of the chest, abdomen and pelvis was performed following the standard protocol without IV contrast. COMPARISON:  03/12/2016 FINDINGS: CT CHEST FINDINGS Mediastinum/Lymph Nodes: There is moderate cardiac enlargement. Calcification of the mitral valve noted. There is a prosthetic aortic valve. Previous median sternotomy and CABG procedure noted. The trachea appears patent and is midline. Normal appearance of the esophagus. No axillary or supraclavicular adenopathy. No mediastinal or hilar adenopathy. Lungs/Pleura: Small pleural effusions are identified bilaterally. There are bilateral lower lobe pulmonary opacities which appear stable to slightly improved from previous exam, image 77 of series 4. Musculoskeletal: No chest wall mass or suspicious bone lesions identified. CT ABDOMEN PELVIS FINDINGS Hepatobiliary: Hypertrophy of the lateral segment of left lobe of liver and caudate lobe of liver identified. Findings are suggestive of cirrhosis. Ill defined low-attenuation lesion within the right lobe of liver measures 1.8 cm, image 59 of series 2. This appears new from 10/08/2014 and is indeterminate. Posterior right lobe of liver low-attenuation structure measures 1.5 cm,  image 65 of series 2. Also new from 10/08/2014. Gallbladder is normal. No biliary dilatation. Pancreas: No mass or inflammatory process identified on this un-enhanced exam. Spleen: The spleen is enlarged measuring 17 cm in cranial caudal dimension. Adrenals/Urinary Tract: Normal adrenal glands. Unremarkable appearance of the right kidney peer the lower pole calculus measures 6 mm, image 77 of series 2. Mild diffuse bladder wall thickening. Small diverticula arises from the right lateral wall of the bladder, image 115 of series 2 Stomach/Bowel: The stomach is normal. The small bowel loops have a normal course and caliber. There is no evidence for bowel obstruction. No pathologic  dilatation of the colon. Multiple distal colonic diverticula noted. Vascular/Lymphatic: Calcified atherosclerotic disease involves the abdominal aorta. No aneurysm. No adenopathy identified within the abdomen or pelvis. Reproductive: Prostate gland and seminal vesicles are unremarkable. Other: There is moderate ascites identified within the abdomen and pelvis. No focal fluid collections identified. Musculoskeletal: Spondylosis is identified within the lumbar spine. This is most advanced at the L5-S1 level. No aggressive lytic or sclerotic bone lesions noted. IMPRESSION: 1. No findings identified to explain patient's sepsis. 2. Morphologic features a liver are concerning for cirrhosis. 3. There are 2 indeterminate low-attenuation foci within the right lobe of liver, new from 10/08/2014. More definitive characterization with contrast enhanced MRI of the liver is suggested as patient's clinical condition tolerates. 4. Splenomegaly 5. Ascites 6. Bilateral pleural effusions with mild bilateral lower lobe pneumonitis. Aeration to the lower lobes appear slightly improved from 03/12/2016. 7. Aortic atherosclerosis 8. Lumbar spondylosis. Electronically Signed   By: Kerby Moors M.D.   On: 04/07/2016 11:58   Dg Chest Port 1 View  04/07/2016   CLINICAL DATA:  Fever for 48 hours. EXAM: PORTABLE CHEST 1 VIEW COMPARISON:  03/12/2016 FINDINGS: There is moderate cardiomegaly and aortic tortuosity, unchanged. Prior sternotomy and CABG. No consolidation. No large effusions. Mild chronic appearing interstitial coarsening. IMPRESSION: Cardiomegaly.  No acute cardiopulmonary findings. Electronically Signed   By: Andreas Newport M.D.   On: 04/07/2016 00:34    Assessment/Plan 1. Debility Patient is admitted for short-term rehabilitation as well as for IV antibiotics for aortic valve prosthesis endocarditis  2. 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 Now with endocarditis.  3. Chronic systolic (congestive) heart failure (HCC) Compensated at present  4. Chronic obstructive pulmonary disease, unspecified COPD type ([place]) Breathing on room air  5. Anemia, unspecified anemia type -CBC  6. Essential hypertension Currently controlled  7. Mantle cell lymphoma, unspecified body region [facility]) In remission  8. Paroxysmal atrial fibrillation (HCC) Currently in normal sinus rhythm  9. Pedal edema -Add furosemide 40 mg daily -BMP this week and next week  10. Peripheral arterial disease (HCC) Stable  11. Prosthetic valve endocarditis, subsequent encounter Currently getting Rocephin 2 g every 12 hours until 05/20/2016  12. Type 2 diabetes mellitus with vascular disease ([place]) Diabetes seems under adequate control at present time

## 2016-04-22 ENCOUNTER — Encounter: Payer: Self-pay | Admitting: Family

## 2016-04-22 ENCOUNTER — Non-Acute Institutional Stay (SKILLED_NURSING_FACILITY): Payer: Medicare Other | Admitting: Family

## 2016-04-22 DIAGNOSIS — I5022 Chronic systolic (congestive) heart failure: Secondary | ICD-10-CM | POA: Diagnosis not present

## 2016-04-22 DIAGNOSIS — R600 Localized edema: Secondary | ICD-10-CM | POA: Diagnosis not present

## 2016-04-22 DIAGNOSIS — J449 Chronic obstructive pulmonary disease, unspecified: Secondary | ICD-10-CM | POA: Insufficient documentation

## 2016-04-22 MED ORDER — FUROSEMIDE 40 MG PO TABS: 40.0000 mg | ORAL_TABLET | Freq: Once | ORAL | Status: DC

## 2016-04-22 NOTE — Progress Notes (Signed)
Patient ID: [name], male   DOB: March 22, 1927, 82 y.o.   MRN: [id number]  Location:  [place] Room Number: 408-P Place of Service:  SNF (31) Provider:  Dinah Ngetich FNP-C   Blanchie Serve, MD  Patient Care Team: Blanchie Serve, MD as PCP - General (Internal Medicine)  Extended Emergency Contact Information Primary Emergency Contact: Forestine Chute Address: [address]          [place], [place] [address] [name] of [place] Phone: [phone] Mobile Phone: [phone] Relation: Spouse Secondary Emergency Contact: Brownfield,Cindy Address: [address]          [place], [place] [address] [name] of [place] Phone: [phone] Mobile Phone: [phone] Relation: Daughter  Code Status: Full Code  Goals of care: Advanced Directive information Advanced Directives 04/19/2016  Does patient have an advance directive? Yes  Type of Advance Directive Out of facility DNR (pink MOST or yellow form)  Copy of advanced directive(s) in chart? Yes  Pre-existing out of facility DNR order (yellow form or pink MOST form) Yellow form placed in chart (order not valid for inpatient use)     Chief Complaint  Patient presents with  . Acute Visit    Leg Swelling    HPI:  Pt is a 82 y.o. male seen today at [facility] for an acute visit for worsening swelling on the legs. He has a significant medical history of HTN, CHF, Hyperlipidemia, BPH, Anemia among others. He is seen in his room lying in bed with wife at bedside. Facility Nurse supervisor reports worsening leg swelling. Also states patient was short of breath while walking with PT/OT. Physical Therapist to take patient to the GYM for exercise but he states not feeling well does not want to go but will to exercise in his room.He denies any fever, chills or cough. He is currently on I.V antibiotics Cetriaxone 2 Gm Q 12 hrs and Ampicillin for endocarditis until  05/20/2016.   Past Medical History  Diagnosis Date  . CAD (coronary artery disease)     a. s/p CABG 1995;  b. NSTEMI & subsequent BMS to SVG->right PDA 02/03/12.;  c. Cath 02/14/2012 3vd with 4/4 patent grafts and patent stent in vg->pda;  d. 10/2014 PCI/DES to the VG->PDA.  . Carotid stenosis     a. Carotid dopplers [id number]: RICA [id number], LICA [id number];  b. dopplers 3/14:  [id number] RICA, [id number] LICA  . HTN (hypertension)   . HLD (hyperlipidemia)   . Asthmatic bronchitis   . BPH (benign prostatic hypertrophy)   . Herpes zoster   . Anemia   . Thrombocytopenia ([place])     a. secondary to splenomegaly related to lymphoma with suspicion of bone marrow involvement. (Plavix had to be stopped due to this)  . Aortic stenosis, severe     a. ECHO 07/22/14 Severe AS. Peak and mean gradients of 57 mmHg and 42 mmHg, respectively. Calculated AVA is 0.8-0.9 cm2. Trivial AR. Valve area (VTI): 0.84 cm^2. Valve area (Vmax): 0.86 cm^2.. Aortic root dimension: 42 mm (ED) and aortic root mildly dilated;  b. 10/2014 S/P TAVR w/ Edwards Sapien 3 THV 83mm;  c. 10/2014 Echo: EF 50%, nl fxning AoV, mean grad of 9, peak of 16.  Marland Kitchen Splenomegaly   . COPD (chronic obstructive pulmonary disease) ([place])   . Peripheral vascular disease ([place])     a. s/p L ray amp 10/13;  b. s/p L 2nd toe amp 12/13  .  Pulmonary nodule     a. 67mm RUL calcified pulm nodule noted on CT staging for lymphoma - stable 10/2012.  . Leg DVT (deep venous thromboembolism), chronic ([place])     a. LE dopplers 5/13, 10/13 and 1/14: chronic DVT involving right mid femoral vein, left mid femoral vein, and left popliteal vein.  . Osteomyelitis ([place])     a. L first foot ray amputation 09/2012  . Heart murmur   . Anginal pain ([place])   . Myocardial infarction [facility]) ?1995  . Pneumonia ?2014  . DM2 (diabetes mellitus, type 2) ([place])   . DJD (degenerative joint disease)   . 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 Stage III s/p bendamustine, Rituxan  therapy  . 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 29 mm Edwards Sapien 3 transcatheter heart valve placed via open right transfemoral approach   Past Surgical History  Procedure Laterality Date  . Coronary artery bypass graft  1995  . Shoulder arthroscopy w/ rotator cuff repair Right   . Carpal tunnel release Bilateral   . Incisional hernia repair    . Kidney surgery      "cut me 1/2 in 2 for stones"  . Back surgery    . I&d extremity  09/16/2012    Procedure: IRRIGATION AND DEBRIDEMENT EXTREMITY;  Surgeon: Wylene Simmer, MD;  Location: [facility];  Service: Orthopedics;  Laterality: Left;  irrigation and debridement and first Ray amputation of left foot  . Amputation  09/16/2012    Procedure: AMPUTATION RAY;  Surgeon: Wylene Simmer, MD;  Location: [facility];  Service: Orthopedics;  Laterality: Left;  first Ray amputation left foot  . Amputation  11/21/2012    Procedure: AMPUTATION RAY;  Surgeon: Wylene Simmer, MD;  Location: [place];  Service: Orthopedics;  Laterality: Left;  LEFT SECOND TOE AMPUTATION  . I&d extremity Left 03/01/2013    Procedure: IRRIGATION AND DEBRIDEMENT EXTREMITY;  Surgeon: Wylene Simmer, MD;  Location: [facility];  Service: Orthopedics;  Laterality: Left;  IRRIGATION  AND  DEBRIDEMENT  LEFT  FOOT  . Amputation Left 12/20/2013    Procedure: AMPUTATION LEFT 5TH TRANSMETATARSAL;  Surgeon: Wylene Simmer, MD;  Location: [place];  Service: Orthopedics;  Laterality: Left;  . Achilles tendon lengthening Left 12/20/2013    Procedure: ACHILLES TENDON LENGTHENING;  Surgeon: Wylene Simmer, MD;  Location: [place];  Service: Orthopedics;  Laterality: Left;  . Hernia repair    . Coronary angioplasty with stent placement  09/2014; 10/14/2014    "?2; 1"  . Cataract extraction w/ intraocular lens  implant, bilateral Bilateral   . Transcatheter aortic valve replacement, transfemoral N/A 11/05/2014    Procedure: TRANSCATHETER AORTIC VALVE REPLACEMENT, TRANSFEMORAL;  Surgeon: [name], MD;  Location: [facility] OR; Berniece Pap 3 THV (size 29 mm, model # [id number], serial # [id number])   . Intraoperative transesophageal echocardiogram N/A 11/05/2014    Procedure: INTRAOPERATIVE TRANSESOPHAGEAL ECHOCARDIOGRAM;  Surgeon: [name], MD;  Location: [facility] OR;  Service: Open Heart Surgery;  Laterality: N/A;  . Left and right heart catheterization with coronary/graft angiogram N/A 02/03/2012    Procedure: LEFT AND RIGHT HEART CATHETERIZATION WITH Beatrix Fetters;  Surgeon: [name], MD;  Location: [facility] CATH LAB;  Service: Cardiovascular;  Laterality: N/A;  . Percutaneous stent intervention  02/03/2012    Procedure: PERCUTANEOUS STENT INTERVENTION;  Surgeon: [name], MD;  Location: [facility] CATH LAB;  Service: Cardiovascular;;  . Left heart catheterization with coronary/graft angiogram  N/A 02/14/2012    Procedure: LEFT HEART CATHETERIZATION WITH Beatrix Fetters;  Surgeon: Peter M Martinique, MD;  Location[facility] CATH LAB;  Service: Cardiovascular;  Laterality: N/A;  . Left and right heart catheterization with coronary/graft angiogram N/A 09/23/2014    Procedure: LEFT AND RIGHT HEART CATHETERIZATION WITH Beatrix Fetters;  Surgeon: Blane Ohara, MD;  Location: [facility] CATH LAB;  Service: Cardiovascular;  Laterality: N/A;  . Percutaneous coronary stent intervention (pci-s)  10/14/2014    Procedure: PERCUTANEOUS CORONARY STENT INTERVENTION (PCI-S);  Surgeon: Blane Ohara, MD;  Location: [facility] CATH LAB;  Service: Cardiovascular;;    Allergies  Allergen Reactions  . Losartan Other (See Comments)    Hyperkalemia > 7  . Azithromycin Itching  . Contrast Media [Iodinated Diagnostic Agents] Rash  . Doxycycline Itching       . Keflex [Cephalexin] Itching  . Macrodantin Itching  . Minocycline Hcl Itching  . Nitrofurantoin Itching  . Zithromax [Azithromycin Dihydrate] Itching      Medication List       This list is accurate as of: 04/22/16  4:26 PM.  Always use  your most recent med list.               ampicillin 2 g in sodium chloride 0.9 % 50 mL  Inject 2 g into the vein every 4 (four) hours. Pharmacy to dose ampicillin, Last dose on 05/20/2016     ANORO ELLIPTA 62.5-25 MCG/INH Aepb  Generic drug:  umeclidinium-vilanterol  Inhale 1 puff into the lungs daily.     aspirin EC 81 MG tablet  Take 81 mg by mouth every morning.     atorvastatin 10 MG tablet  Commonly known as:  LIPITOR  Take 10 mg by mouth daily.     carvedilol 3.125 MG tablet  Commonly known as:  COREG  Take 1 tablet (3.125 mg total) by mouth 2 (two) times daily with a meal.     cefTRIAXone 2 g in dextrose 5 % 50 mL  Inject 2 g into the vein every 12 (twelve) hours. End date 05/20/2016     clopidogrel 75 MG tablet  Commonly known as:  PLAVIX  Take 75 mg by mouth daily.     famotidine 20 MG tablet  Commonly known as:  PEPCID  Take 1 tablet (20 mg total) by mouth at bedtime.     feeding supplement (ENSURE ENLIVE) Liqd  Take 237 mLs by mouth 3 (three) times daily between meals.     furosemide 40 MG tablet  Commonly known as:  LASIX  Take 40 mg by mouth daily.     gabapentin 300 MG capsule  Commonly known as:  NEURONTIN  Take 300 mg by mouth 2 (two) times daily.     guaiFENesin 600 MG 12 hr tablet  Commonly known as:  MUCINEX  Take 1 tablet (600 mg total) by mouth 2 (two) times daily.     hydrALAZINE 10 MG tablet  Commonly known as:  APRESOLINE  Take 1 tablet (10 mg total) by mouth every 8 (eight) hours.     hydrOXYzine 25 MG tablet  Commonly known as:  ATARAX/VISTARIL  Take 25 mg by mouth every 6 (six) hours as needed for itching.     ipratropium-albuterol 0.5-2.5 (3) MG/3ML Soln  Commonly known as:  DUONEB  Take 3 mLs by nebulization every 6 (six) hours as needed.     isosorbide mononitrate 30 MG 24 hr tablet  Commonly known as:  IMDUR  Take  30 mg by mouth daily.     metFORMIN 500 MG tablet  Commonly known as:  GLUCOPHAGE  Take 500 mg by mouth 2  (two) times daily with a meal.     nitroGLYCERIN 0.4 MG SL tablet  Commonly known as:  NITROSTAT  Place 1 tablet (0.4 mg total) under the tongue every 5 (five) minutes as needed for chest pain.     OXYGEN  Inhale 2 L into the lungs continuous.     phenol 1.4 % Liqd  Commonly known as:  CHLORASEPTIC  Use as directed 2 sprays in the mouth or throat every 2 (two) hours as needed for throat irritation / pain.     saccharomyces boulardii 250 MG capsule  Commonly known as:  FLORASTOR  Take 250 mg by mouth 2 (two) times daily. Take one capsule twice daily for 10 days     spironolactone 25 MG tablet  Commonly known as:  ALDACTONE  Take 0.5 tablets (12.5 mg total) by mouth daily.     traMADol 50 MG tablet  Commonly known as:  ULTRAM  Take 1 tablet (50 mg total) by mouth every 12 (twelve) hours as needed for moderate pain.     vitamin B-12 500 MCG tablet  Commonly known as:  CYANOCOBALAMIN  Take 1,000 mcg by mouth daily.        Review of Systems  Constitutional: Negative for fever, chills, activity change, appetite change and fatigue.  HENT: Negative for congestion, rhinorrhea, sinus pressure and sore throat.   Eyes: Negative.   Respiratory: Negative for cough, chest tightness, wheezing and stridor.   Cardiovascular: Positive for leg swelling. Negative for chest pain and palpitations.  Gastrointestinal: Negative for nausea, vomiting, abdominal pain, diarrhea, constipation and abdominal distention.  Genitourinary: Negative for dysuria, urgency, frequency and flank pain.  Musculoskeletal: Positive for gait problem.  Skin: Negative.   Neurological: Negative for dizziness and headaches.  Psychiatric/Behavioral: Negative for hallucinations, confusion and agitation.    Immunization History  Administered Date(s) Administered  . Influenza Split 11/15/2012  . Influenza-Unspecified 09/05/2014  . PPD Test 04/13/2016   Pertinent  Health Maintenance Due  Topic Date Due  . FOOT EXAM   12/06/2016 (Originally 08/20/1937)  . OPHTHALMOLOGY EXAM  12/06/2016 (Originally 08/20/1937)  . URINE MICROALBUMIN  12/06/2016 (Originally 08/20/1937)  . PNA vac Low Risk Adult (1 of 2 - PCV13) 04/15/2017 (Originally 08/19/1994)  . INFLUENZA VACCINE  07/06/2016  . HEMOGLOBIN A1C  09/12/2016   Fall Risk  01/08/2014  Falls in the past year? Yes  Number falls in past yr: 1  Injury with Fall? No  Risk Factor Category  High Fall Risk  Risk for fall due to : History of fall(s);Impaired balance/gait;Impaired mobility   Functional Status Survey:    Filed Vitals:   04/22/16 1529  BP: 135/58  Pulse: 71  Temp: 98.5 F (36.9 C)  TempSrc: Oral  Resp: 18  Height: 6' (1.829 m)  Weight: 194 lb (87.998 kg)  SpO2: 97%   Body mass index is 26.31 kg/(m^2). Physical Exam  Constitutional: He is oriented to person, place, and time. He appears well-developed and well-nourished. No distress.  Elderly   HENT:  Head: Normocephalic.  Mouth/Throat: Oropharynx is clear and moist.  Eyes: Conjunctivae and EOM are normal. Pupils are equal, round, and reactive to light. Right eye exhibits no discharge. Left eye exhibits no discharge. No scleral icterus.  Neck: Normal range of motion. No JVD present. No thyromegaly present.  Cardiovascular: Normal rate, normal  heart sounds and intact distal pulses.  Exam reveals no gallop and no friction rub.   No murmur heard. Pulmonary/Chest: Effort normal and breath sounds normal. No respiratory distress. He has no rales.  scattered diminished wheezes bilateral.   Abdominal: Soft. Bowel sounds are normal. He exhibits no distension. There is no tenderness. There is no rebound and no guarding.  Musculoskeletal: Normal range of motion. He exhibits no tenderness.  Bilateral +3 edema to both lower extremities  Lymphadenopathy:    He has no cervical adenopathy.  Neurological: He is oriented to person, place, and time.  Skin: Skin is warm and dry. No rash noted. No erythema. No  pallor.  Psychiatric: He has a normal mood and affect.    Labs reviewed:  Recent Labs  04/11/16 0509 04/12/16 0525 04/13/16 04/13/16 0525  NA 138 138 139 139  K 3.3* 4.7  --  4.1  CL 104 106  --  105  CO2 25 18*  --  24  GLUCOSE 133* 197*  --  201*  BUN 55* 49* 47* 47*  CREATININE 1.25* 1.15 1.1 1.13  CALCIUM 8.8* 8.8*  --  9.1  MG  --   --   --  1.9    Recent Labs  04/10/16 0514 04/11/16 0509 04/12/16 0525  AST 47* 45* 36  ALT 49 48 40  ALKPHOS 172* 160* 133*  BILITOT 1.0 0.9 0.8  PROT 4.9* 5.1* 4.9*  ALBUMIN 3.1* 3.1* 2.9*    Recent Labs  04/07/16 0013 04/07/16 0421  04/10/16 0514 04/12/16 04/12/16 0919 04/12/16 1332  WBC 12.9* 15.4*  < > 7.0 7.7 5.2 7.7  NEUTROABS 11.0* 13.1*  --   --   --   --   --   HGB 9.2* 9.4*  < > 9.4*  --  9.9* 10.4*  HCT 27.7* 28.6*  < > 28.2*  --  29.6* 31.6*  MCV 81.0 82.7  < > 81.7  --  83.1 84.0  PLT 74* 60*  < > 71*  --  52* 80*  < > = values in this interval not displayed. Lab Results  Component Value Date   TSH 1.290 02/02/2012   Lab Results  Component Value Date   HGBA1C 6.3* 03/13/2016   Lab Results  Component Value Date   CHOL 111 02/02/2012   HDL 24* 02/02/2012   LDLCALC 72 02/02/2012   LDLDIRECT 66.0 02/22/2008   TRIG 76 02/02/2012   CHOLHDL 4.6 02/02/2012    Significant Diagnostic Results in last 30 days:  Ct Abdomen Pelvis Wo Contrast  04/07/2016  CLINICAL DATA:  Abdominal pain.  Mild diffuse abdominal tenderness. EXAM: CT CHEST, ABDOMEN AND PELVIS WITHOUT CONTRAST TECHNIQUE: Multidetector CT imaging of the chest, abdomen and pelvis was performed following the standard protocol without IV contrast. COMPARISON:  03/12/2016 FINDINGS: CT CHEST FINDINGS Mediastinum/Lymph Nodes: There is moderate cardiac enlargement. Calcification of the mitral valve noted. There is a prosthetic aortic valve. Previous median sternotomy and CABG procedure noted. The trachea appears patent and is midline. Normal appearance of the  esophagus. No axillary or supraclavicular adenopathy. No mediastinal or hilar adenopathy. Lungs/Pleura: Small pleural effusions are identified bilaterally. There are bilateral lower lobe pulmonary opacities which appear stable to slightly improved from previous exam, image 77 of series 4. Musculoskeletal: No chest wall mass or suspicious bone lesions identified. CT ABDOMEN PELVIS FINDINGS Hepatobiliary: Hypertrophy of the lateral segment of left lobe of liver and caudate lobe of liver identified. Findings are suggestive of cirrhosis.  Ill defined low-attenuation lesion within the right lobe of liver measures 1.8 cm, image 59 of series 2. This appears new from 10/08/2014 and is indeterminate. Posterior right lobe of liver low-attenuation structure measures 1.5 cm, image 65 of series 2. Also new from 10/08/2014. Gallbladder is normal. No biliary dilatation. Pancreas: No mass or inflammatory process identified on this un-enhanced exam. Spleen: The spleen is enlarged measuring 17 cm in cranial caudal dimension. Adrenals/Urinary Tract: Normal adrenal glands. Unremarkable appearance of the right kidney peer the lower pole calculus measures 6 mm, image 77 of series 2. Mild diffuse bladder wall thickening. Small diverticula arises from the right lateral wall of the bladder, image 115 of series 2 Stomach/Bowel: The stomach is normal. The small bowel loops have a normal course and caliber. There is no evidence for bowel obstruction. No pathologic dilatation of the colon. Multiple distal colonic diverticula noted. Vascular/Lymphatic: Calcified atherosclerotic disease involves the abdominal aorta. No aneurysm. No adenopathy identified within the abdomen or pelvis. Reproductive: Prostate gland and seminal vesicles are unremarkable. Other: There is moderate ascites identified within the abdomen and pelvis. No focal fluid collections identified. Musculoskeletal: Spondylosis is identified within the lumbar spine. This is most  advanced at the L5-S1 level. No aggressive lytic or sclerotic bone lesions noted. IMPRESSION: 1. No findings identified to explain patient's sepsis. 2. Morphologic features a liver are concerning for cirrhosis. 3. There are 2 indeterminate low-attenuation foci within the right lobe of liver, new from 10/08/2014. More definitive characterization with contrast enhanced MRI of the liver is suggested as patient's clinical condition tolerates. 4. Splenomegaly 5. Ascites 6. Bilateral pleural effusions with mild bilateral lower lobe pneumonitis. Aeration to the lower lobes appear slightly improved from 03/12/2016. 7. Aortic atherosclerosis 8. Lumbar spondylosis. Electronically Signed   By: Kerby Moors M.D.   On: 04/07/2016 11:58   Ct Head Wo Contrast  04/07/2016  CLINICAL DATA:  Fever, confusion, elevated white cell count. Acute encephalopathy. EXAM: CT HEAD WITHOUT CONTRAST TECHNIQUE: Contiguous axial images were obtained from the base of the skull through the vertex without intravenous contrast. COMPARISON:  07/21/2014 FINDINGS: Examination is somewhat limited by motion artifact. Diffuse cerebral atrophy. Mild ventricular dilatation consistent with central atrophy. Low-attenuation changes in the deep white matter consistent small vessel ischemia. No mass effect or midline shift. No abnormal extra-axial fluid collections. Gray-white matter junctions are distinct. Basal cisterns are not effaced. No evidence of acute intracranial hemorrhage. No depressed skull fractures. Visualized paranasal sinuses and mastoid air cells are not opacified. IMPRESSION: No acute intracranial abnormalities. Mild chronic atrophy and small vessel ischemic changes. Electronically Signed   By: Lucienne Capers M.D.   On: 04/07/2016 03:14   Ct Chest Wo Contrast  04/07/2016  CLINICAL DATA:  Abdominal pain.  Mild diffuse abdominal tenderness. EXAM: CT CHEST, ABDOMEN AND PELVIS WITHOUT CONTRAST TECHNIQUE: Multidetector CT imaging of the chest,  abdomen and pelvis was performed following the standard protocol without IV contrast. COMPARISON:  03/12/2016 FINDINGS: CT CHEST FINDINGS Mediastinum/Lymph Nodes: There is moderate cardiac enlargement. Calcification of the mitral valve noted. There is a prosthetic aortic valve. Previous median sternotomy and CABG procedure noted. The trachea appears patent and is midline. Normal appearance of the esophagus. No axillary or supraclavicular adenopathy. No mediastinal or hilar adenopathy. Lungs/Pleura: Small pleural effusions are identified bilaterally. There are bilateral lower lobe pulmonary opacities which appear stable to slightly improved from previous exam, image 77 of series 4. Musculoskeletal: No chest wall mass or suspicious bone lesions identified. CT ABDOMEN PELVIS  FINDINGS Hepatobiliary: Hypertrophy of the lateral segment of left lobe of liver and caudate lobe of liver identified. Findings are suggestive of cirrhosis. Ill defined low-attenuation lesion within the right lobe of liver measures 1.8 cm, image 59 of series 2. This appears new from 10/08/2014 and is indeterminate. Posterior right lobe of liver low-attenuation structure measures 1.5 cm, image 65 of series 2. Also new from 10/08/2014. Gallbladder is normal. No biliary dilatation. Pancreas: No mass or inflammatory process identified on this un-enhanced exam. Spleen: The spleen is enlarged measuring 17 cm in cranial caudal dimension. Adrenals/Urinary Tract: Normal adrenal glands. Unremarkable appearance of the right kidney peer the lower pole calculus measures 6 mm, image 77 of series 2. Mild diffuse bladder wall thickening. Small diverticula arises from the right lateral wall of the bladder, image 115 of series 2 Stomach/Bowel: The stomach is normal. The small bowel loops have a normal course and caliber. There is no evidence for bowel obstruction. No pathologic dilatation of the colon. Multiple distal colonic diverticula noted. Vascular/Lymphatic:  Calcified atherosclerotic disease involves the abdominal aorta. No aneurysm. No adenopathy identified within the abdomen or pelvis. Reproductive: Prostate gland and seminal vesicles are unremarkable. Other: There is moderate ascites identified within the abdomen and pelvis. No focal fluid collections identified. Musculoskeletal: Spondylosis is identified within the lumbar spine. This is most advanced at the L5-S1 level. No aggressive lytic or sclerotic bone lesions noted. IMPRESSION: 1. No findings identified to explain patient's sepsis. 2. Morphologic features a liver are concerning for cirrhosis. 3. There are 2 indeterminate low-attenuation foci within the right lobe of liver, new from 10/08/2014. More definitive characterization with contrast enhanced MRI of the liver is suggested as patient's clinical condition tolerates. 4. Splenomegaly 5. Ascites 6. Bilateral pleural effusions with mild bilateral lower lobe pneumonitis. Aeration to the lower lobes appear slightly improved from 03/12/2016. 7. Aortic atherosclerosis 8. Lumbar spondylosis. Electronically Signed   By: Kerby Moors M.D.   On: 04/07/2016 11:58   US Paracentesis  04/08/2016  INDICATION: Patient with history of sepsis/bacteremia, mantle cell lymphoma, ascites. Request made for diagnostic and therapeutic paracentesis. EXAM: ULTRASOUND GUIDED DIAGNOSTIC AND THERAPEUTIC PARACENTESIS MEDICATIONS: None. COMPLICATIONS: None immediate. PROCEDURE: Informed written consent was obtained from the patient's family after a discussion of the risks, benefits and alternatives to treatment. A timeout was performed prior to the initiation of the procedure. Initial ultrasound scanning demonstrates a small amount of ascites within the right lower abdominal quadrant. The right lower abdomen was prepped and draped in the usual sterile fashion. 1% lidocaine was used for local anesthesia. Following this, a Yueh catheter was introduced. An ultrasound image was saved for  documentation purposes. The paracentesis was performed. The catheter was removed and a dressing was applied. The patient tolerated the procedure well without immediate post procedural complication. FINDINGS: A total of approximately 1.2 liters of slightly hazy, yellow fluid was removed. Samples were sent to the laboratory as requested by the clinical team. IMPRESSION: Successful ultrasound-guided diagnostic and therapeutic paracentesis yielding 1.2 liters of peritoneal fluid. Read by: Rowe Robert, PA-C Electronically Signed   By: Jacqulynn Cadet M.D.   On: 04/08/2016 11:46   Dg Chest Port 1 View  04/08/2016  CLINICAL DATA:  Wheezing, cough. EXAM: PORTABLE CHEST 1 VIEW COMPARISON:  Apr 06, 2016. FINDINGS: Stable cardiomegaly. Status post Coronary artery bypass graft. No pneumothorax or pleural effusion is noted. Stable interstitial densities are noted throughout both lungs most consistent with scarring. Bony thorax appears intact. IMPRESSION: Stable interstitial densities are  noted throughout both lungs most consistent with scarring. No significant changes noted compared to prior exam. Electronically Signed   By: Marijo Conception, M.D.   On: 04/08/2016 09:19   Dg Chest Port 1 View  04/07/2016  CLINICAL DATA:  Fever for 48 hours. EXAM: PORTABLE CHEST 1 VIEW COMPARISON:  03/12/2016 FINDINGS: There is moderate cardiomegaly and aortic tortuosity, unchanged. Prior sternotomy and CABG. No consolidation. No large effusions. Mild chronic appearing interstitial coarsening. IMPRESSION: Cardiomegaly.  No acute cardiopulmonary findings. Electronically Signed   By: Andreas Newport M.D.   On: 04/07/2016 00:34    Assessment/Plan 1. Chronic systolic (congestive) heart failure (HCC) Has had 3 pounds weight gain over 3 days. Shortness of breath with exercise with Physical therapy today. Increase Furosemide to 40 mg Tablet twice daily. Furosemide 40 mg Tablet X 1 dose now. BMP 04/26/2016   2. Localized edema Worsening to  lower extremities with 3 pounds weight gain. Increase Furosemide to 40 mg Tablet twice daily. Furosemide 40 mg Tablet X 1 dose now. BMP 04/26/2016. Bilateral Knee high Ted hose on in the morning and off at bedtime.      Family/ staff Communication: Reviewed plan of care with patient, patient's wife and facility Nurse supervisor Labs/tests ordered: BMP 04/26/2016.

## 2016-04-25 DIAGNOSIS — R5381 Other malaise: Secondary | ICD-10-CM | POA: Insufficient documentation

## 2016-04-26 ENCOUNTER — Non-Acute Institutional Stay (SKILLED_NURSING_FACILITY): Payer: Medicare Other | Admitting: Family

## 2016-04-26 DIAGNOSIS — R600 Localized edema: Secondary | ICD-10-CM | POA: Diagnosis not present

## 2016-04-26 DIAGNOSIS — R410 Disorientation, unspecified: Secondary | ICD-10-CM | POA: Diagnosis not present

## 2016-04-26 NOTE — Progress Notes (Signed)
Patient ID: [name], male   DOB: September 07, 1927, 82 y.o.   MRN: [id number]  Location:  [place]:  SNF (31) Provider:  Ronald Vinsant FNP-C   Blanchie Serve, MD  Patient Care Team: Blanchie Serve, MD as PCP - General (Internal Medicine)  Extended Emergency Contact Information Primary Emergency Contact: Forestine Chute Address: [address]          [place], [place] [address] [name] of [place] Phone: [phone] Mobile Phone: [phone] Relation: Spouse Secondary Emergency Contact: Brownfield,Cindy Address: [address]          [place], [place] [address] [name] of [place] Phone: [phone] Mobile Phone: [phone] Relation: Daughter  Code Status: Full Code  Goals of care: Advanced Directive information Advanced Directives 04/19/2016  Does patient have an advance directive? Yes  Type of Advance Directive Out of facility DNR (pink MOST or yellow form)  Copy of advanced directive(s) in chart? Yes  Pre-existing out of facility DNR order (yellow form or pink MOST form) Yellow form placed in chart (order not valid for inpatient use)     Chief Complaint  Patient presents with  . Acute Visit    follow up leg edema     HPI:  Pt is a 82 y.o. male seen today at [facility] for an acute visit for follow up edema on the legs. He has a medical history of CAD, HTN, COPD, BPH among others. He is seen today in his room with wife at bedside. His recent CXR PA and Lat showed small effusion. Bilateral Leg +3 edema . Furosemide recent increased to 40 mg twice daily.    Past Medical History  Diagnosis Date  . CAD (coronary artery disease)     a. s/p CABG 1995;  b. NSTEMI & subsequent BMS to SVG->right PDA 02/03/12.;  c. Cath 02/14/2012 3vd with 4/4 patent grafts and patent stent in vg->pda;  d. 10/2014 PCI/DES to the VG->PDA.  . Carotid stenosis     a. Carotid dopplers [id number]: RICA [id number], LICA [id number];  b.  dopplers 3/14:  [id number] RICA, [id number] LICA  . HTN (hypertension)   . HLD (hyperlipidemia)   . Asthmatic bronchitis   . BPH (benign prostatic hypertrophy)   . Herpes zoster   . Anemia   . Thrombocytopenia ([place])     a. secondary to splenomegaly related to lymphoma with suspicion of bone marrow involvement. (Plavix had to be stopped due to this)  . Aortic stenosis, severe     a. ECHO 07/22/14 Severe AS. Peak and mean gradients of 57 mmHg and 42 mmHg, respectively. Calculated AVA is 0.8-0.9 cm2. Trivial AR. Valve area (VTI): 0.84 cm^2. Valve area (Vmax): 0.86 cm^2.. Aortic root dimension: 42 mm (ED) and aortic root mildly dilated;  b. 10/2014 S/P TAVR w/ Edwards Sapien 3 THV 49mm;  c. 10/2014 Echo: EF 50%, nl fxning AoV, mean grad of 9, peak of 16.  Marland Kitchen Splenomegaly   . COPD (chronic obstructive pulmonary disease) ([place])   . Peripheral vascular disease ([place])     a. s/p L ray amp 10/13;  b. s/p L 2nd toe amp 12/13  . Pulmonary nodule     a. 85mm RUL calcified pulm nodule noted on CT staging for lymphoma - stable 10/2012.  . Leg DVT (deep venous thromboembolism), chronic ([place])     a. LE dopplers 5/13, 10/13 and 1/14: chronic DVT involving right mid femoral vein, left mid  femoral vein, and left popliteal vein.  . Osteomyelitis ([place])     a. L first foot ray amputation 09/2012  . Heart murmur   . Anginal pain ([place])   . Myocardial infarction [facility]) ?1995  . Pneumonia ?2014  . DM2 (diabetes mellitus, type 2) ([place])   . DJD (degenerative joint disease)   . 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 Stage III s/p bendamustine, Rituxan therapy  . 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 29 mm Edwards Sapien 3 transcatheter heart valve placed via open right transfemoral approach   Past Surgical History  Procedure Laterality Date  . Coronary artery bypass graft  1995  . Shoulder arthroscopy w/ rotator cuff repair Right   . Carpal tunnel release Bilateral   .  Incisional hernia repair    . Kidney surgery      "cut me 1/2 in 2 for stones"  . Back surgery    . I&d extremity  09/16/2012    Procedure: IRRIGATION AND DEBRIDEMENT EXTREMITY;  Surgeon: Wylene Simmer, MD;  Location: [facility];  Service: Orthopedics;  Laterality: Left;  irrigation and debridement and first Ray amputation of left foot  . Amputation  09/16/2012    Procedure: AMPUTATION RAY;  Surgeon: Wylene Simmer, MD;  Location: [facility];  Service: Orthopedics;  Laterality: Left;  first Ray amputation left foot  . Amputation  11/21/2012    Procedure: AMPUTATION RAY;  Surgeon: Wylene Simmer, MD;  Location: [place];  Service: Orthopedics;  Laterality: Left;  LEFT SECOND TOE AMPUTATION  . I&d extremity Left 03/01/2013    Procedure: IRRIGATION AND DEBRIDEMENT EXTREMITY;  Surgeon: Wylene Simmer, MD;  Location: [facility];  Service: Orthopedics;  Laterality: Left;  IRRIGATION  AND  DEBRIDEMENT  LEFT  FOOT  . Amputation Left 12/20/2013    Procedure: AMPUTATION LEFT 5TH TRANSMETATARSAL;  Surgeon: Wylene Simmer, MD;  Location: [place];  Service: Orthopedics;  Laterality: Left;  . Achilles tendon lengthening Left 12/20/2013    Procedure: ACHILLES TENDON LENGTHENING;  Surgeon: Wylene Simmer, MD;  Location: [place];  Service: Orthopedics;  Laterality: Left;  . Hernia repair    . Coronary angioplasty with stent placement  09/2014; 10/14/2014    "?2; 1"  . Cataract extraction w/ intraocular lens  implant, bilateral Bilateral   . Transcatheter aortic valve replacement, transfemoral N/A 11/05/2014    Procedure: TRANSCATHETER AORTIC VALVE REPLACEMENT, TRANSFEMORAL;  Surgeon: [name], MD;  Location: [facility] OR; Berniece Pap 3 THV (size 29 mm, model # F048547, serial # [id number])   . Intraoperative transesophageal echocardiogram N/A 11/05/2014    Procedure: INTRAOPERATIVE TRANSESOPHAGEAL ECHOCARDIOGRAM;  Surgeon: [name], MD;  Location: [facility] OR;  Service: Open Heart Surgery;  Laterality: N/A;  . Left and right heart catheterization  with coronary/graft angiogram N/A 02/03/2012    Procedure: LEFT AND RIGHT HEART CATHETERIZATION WITH Beatrix Fetters;  Surgeon: [name], MD;  Location: [facility] CATH LAB;  Service: Cardiovascular;  Laterality: N/A;  . Percutaneous stent intervention  02/03/2012    Procedure: PERCUTANEOUS STENT INTERVENTION;  Surgeon: [name], MD;  Location: [facility] CATH LAB;  Service: Cardiovascular;;  . Left heart catheterization with coronary/graft angiogram N/A 02/14/2012    Procedure: LEFT HEART CATHETERIZATION WITH Beatrix Fetters;  Surgeon: Peter M Martinique, MD;  Location: [facility] CATH LAB;  Service: Cardiovascular;  Laterality: N/A;  . Left and right heart catheterization with coronary/graft angiogram N/A 09/23/2014    Procedure: LEFT AND RIGHT HEART CATHETERIZATION  WITH Beatrix Fetters;  Surgeon: Blane Ohara, MD;  Location: [facility] CATH LAB;  Service: Cardiovascular;  Laterality: N/A;  . Percutaneous coronary stent intervention (pci-s)  10/14/2014    Procedure: PERCUTANEOUS CORONARY STENT INTERVENTION (PCI-S);  Surgeon: Blane Ohara, MD;  Location: [facility] CATH LAB;  Service: Cardiovascular;;    Allergies  Allergen Reactions  . Losartan Other (See Comments)    Hyperkalemia > 7  . Azithromycin Itching  . Contrast Media [Iodinated Diagnostic Agents] Rash  . Doxycycline Itching       . Keflex [Cephalexin] Itching  . Macrodantin Itching  . Minocycline Hcl Itching  . Nitrofurantoin Itching  . Zithromax [Azithromycin Dihydrate] Itching      Medication List       This list is accurate as of: 04/26/16 12:33 PM.  Always use your most recent med list.               ampicillin 2 g in sodium chloride 0.9 % 50 mL  Inject 2 g into the vein every 4 (four) hours. Pharmacy to dose ampicillin, Last dose on 05/20/2016     ANORO ELLIPTA 62.5-25 MCG/INH Aepb  Generic drug:  umeclidinium-vilanterol  Inhale 1 puff into the lungs daily.     aspirin EC 81 MG tablet  Take 81 mg by mouth  every morning.     atorvastatin 10 MG tablet  Commonly known as:  LIPITOR  Take 10 mg by mouth daily.     carvedilol 3.125 MG tablet  Commonly known as:  COREG  Take 1 tablet (3.125 mg total) by mouth 2 (two) times daily with a meal.     cefTRIAXone 2 g in dextrose 5 % 50 mL  Inject 2 g into the vein every 12 (twelve) hours. End date 05/20/2016     clopidogrel 75 MG tablet  Commonly known as:  PLAVIX  Take 75 mg by mouth daily.     famotidine 20 MG tablet  Commonly known as:  PEPCID  Take 1 tablet (20 mg total) by mouth at bedtime.     feeding supplement (ENSURE ENLIVE) Liqd  Take 237 mLs by mouth 3 (three) times daily between meals.     furosemide 40 MG tablet  Commonly known as:  LASIX  Take 40 mg by mouth 2 (two) times daily.     gabapentin 300 MG capsule  Commonly known as:  NEURONTIN  Take 300 mg by mouth 2 (two) times daily.     guaiFENesin 600 MG 12 hr tablet  Commonly known as:  MUCINEX  Take 1 tablet (600 mg total) by mouth 2 (two) times daily.     hydrALAZINE 10 MG tablet  Commonly known as:  APRESOLINE  Take 1 tablet (10 mg total) by mouth every 8 (eight) hours.     hydrOXYzine 25 MG tablet  Commonly known as:  ATARAX/VISTARIL  Take 25 mg by mouth every 6 (six) hours as needed for itching.     ipratropium-albuterol 0.5-2.5 (3) MG/3ML Soln  Commonly known as:  DUONEB  Take 3 mLs by nebulization every 6 (six) hours as needed.     isosorbide mononitrate 30 MG 24 hr tablet  Commonly known as:  IMDUR  Take 30 mg by mouth daily.     metFORMIN 500 MG tablet  Commonly known as:  GLUCOPHAGE  Take 500 mg by mouth 2 (two) times daily with a meal.     nitroGLYCERIN 0.4 MG SL tablet  Commonly known as:  NITROSTAT  Place 1 tablet (0.4 mg total) under the tongue every 5 (five) minutes as needed for chest pain.     OXYGEN  Inhale 2 L into the lungs continuous.     phenol 1.4 % Liqd  Commonly known as:  CHLORASEPTIC  Use as directed 2 sprays in the mouth or  throat every 2 (two) hours as needed for throat irritation / pain.     saccharomyces boulardii 250 MG capsule  Commonly known as:  FLORASTOR  Take 250 mg by mouth 2 (two) times daily. Take one capsule twice daily for 10 days     spironolactone 25 MG tablet  Commonly known as:  ALDACTONE  Take 0.5 tablets (12.5 mg total) by mouth daily.     traMADol 50 MG tablet  Commonly known as:  ULTRAM  Take 1 tablet (50 mg total) by mouth every 12 (twelve) hours as needed for moderate pain.     vitamin B-12 500 MCG tablet  Commonly known as:  CYANOCOBALAMIN  Take 1,000 mcg by mouth daily.        Review of Systems  Constitutional: Negative for fever, chills, activity change, appetite change and fatigue.  HENT: Negative for congestion, rhinorrhea, sinus pressure and sore throat.   Eyes: Negative.   Respiratory: Negative for cough, chest tightness, wheezing and stridor.   Cardiovascular: Positive for leg swelling. Negative for chest pain and palpitations.  Gastrointestinal: Negative for nausea, vomiting, abdominal pain, diarrhea, constipation and abdominal distention.  Genitourinary: Negative for dysuria, urgency, frequency and flank pain.  Musculoskeletal: Positive for gait problem.  Skin: Negative.   Neurological: Negative for dizziness and headaches.  Psychiatric/Behavioral: Negative for hallucinations, confusion and agitation.    Immunization History  Administered Date(s) Administered  . Influenza Split 11/15/2012  . Influenza-Unspecified 09/05/2014  . PPD Test 04/13/2016   Pertinent  Health Maintenance Due  Topic Date Due  . OPHTHALMOLOGY EXAM  12/06/2016 (Originally 08/20/1937)  . URINE MICROALBUMIN  12/06/2016 (Originally 08/20/1937)  . PNA vac Low Risk Adult (1 of 2 - PCV13) 04/15/2017 (Originally 08/19/1994)  . INFLUENZA VACCINE  07/06/2016  . HEMOGLOBIN A1C  09/12/2016  . FOOT EXAM  04/19/2017   Fall Risk  01/08/2014  Falls in the past year? Yes  Number falls in past yr: 1    Injury with Fall? No  Risk Factor Category  High Fall Risk  Risk for fall due to : History of fall(s);Impaired balance/gait;Impaired mobility   Functional Status Survey:    Filed Vitals:   04/26/16 1228  BP: 138/58  Pulse: 71  Temp: 98.5 F (36.9 C)  Resp: 18  Height: 6' (1.829 m)  Weight: 191 lb 12.8 oz (87 kg)  SpO2: 96%   Body mass index is 26.01 kg/(m^2). Physical Exam  Constitutional: He appears well-developed and well-nourished. No distress.  HENT:  Head: Normocephalic.  Mouth/Throat: Oropharynx is clear and moist.  Eyes: Conjunctivae and EOM are normal. Pupils are equal, round, and reactive to light. Right eye exhibits no discharge. Left eye exhibits no discharge. No scleral icterus.  Neck: Normal range of motion. No JVD present. No thyromegaly present.  Cardiovascular: Normal rate, regular rhythm, normal heart sounds and intact distal pulses.  Exam reveals no gallop and no friction rub.   No murmur heard. Pulmonary/Chest: Effort normal and breath sounds normal. No respiratory distress. He has no wheezes. He has no rales.  Abdominal: Soft. Bowel sounds are normal. He exhibits no distension and no mass. There is no tenderness. There is no  rebound and no guarding.  Musculoskeletal: Normal range of motion. He exhibits no tenderness.  Bilateral +3 edema   Lymphadenopathy:    He has no cervical adenopathy.  Neurological: He is alert.  Skin: Skin is warm and dry. No rash noted. No erythema. No pallor.  Psychiatric: He has a normal mood and affect.    Labs reviewed:  Recent Labs  04/11/16 0509 04/12/16 0525 04/13/16 04/13/16 0525  NA 138 138 139 139  K 3.3* 4.7  --  4.1  CL 104 106  --  105  CO2 25 18*  --  24  GLUCOSE 133* 197*  --  201*  BUN 55* 49* 47* 47*  CREATININE 1.25* 1.15 1.1 1.13  CALCIUM 8.8* 8.8*  --  9.1  MG  --   --   --  1.9    Recent Labs  04/10/16 0514 04/11/16 0509 04/12/16 0525  AST 47* 45* 36  ALT 49 48 40  ALKPHOS 172* 160* 133*   BILITOT 1.0 0.9 0.8  PROT 4.9* 5.1* 4.9*  ALBUMIN 3.1* 3.1* 2.9*    Recent Labs  04/07/16 0013 04/07/16 0421  04/10/16 0514 04/12/16 04/12/16 0919 04/12/16 1332  WBC 12.9* 15.4*  < > 7.0 7.7 5.2 7.7  NEUTROABS 11.0* 13.1*  --   --   --   --   --   HGB 9.2* 9.4*  < > 9.4*  --  9.9* 10.4*  HCT 27.7* 28.6*  < > 28.2*  --  29.6* 31.6*  MCV 81.0 82.7  < > 81.7  --  83.1 84.0  PLT 74* 60*  < > 71*  --  52* 80*  < > = values in this interval not displayed. Lab Results  Component Value Date   TSH 1.290 02/02/2012   Lab Results  Component Value Date   HGBA1C 6.3* 03/13/2016   Lab Results  Component Value Date   CHOL 111 02/02/2012   HDL 24* 02/02/2012   LDLCALC 72 02/02/2012   LDLDIRECT 66.0 02/22/2008   TRIG 76 02/02/2012   CHOLHDL 4.6 02/02/2012    Significant Diagnostic Results in last 30 days:  Ct Abdomen Pelvis Wo Contrast  04/07/2016  CLINICAL DATA:  Abdominal pain.  Mild diffuse abdominal tenderness. EXAM: CT CHEST, ABDOMEN AND PELVIS WITHOUT CONTRAST TECHNIQUE: Multidetector CT imaging of the chest, abdomen and pelvis was performed following the standard protocol without IV contrast. COMPARISON:  03/12/2016 FINDINGS: CT CHEST FINDINGS Mediastinum/Lymph Nodes: There is moderate cardiac enlargement. Calcification of the mitral valve noted. There is a prosthetic aortic valve. Previous median sternotomy and CABG procedure noted. The trachea appears patent and is midline. Normal appearance of the esophagus. No axillary or supraclavicular adenopathy. No mediastinal or hilar adenopathy. Lungs/Pleura: Small pleural effusions are identified bilaterally. There are bilateral lower lobe pulmonary opacities which appear stable to slightly improved from previous exam, image 77 of series 4. Musculoskeletal: No chest wall mass or suspicious bone lesions identified. CT ABDOMEN PELVIS FINDINGS Hepatobiliary: Hypertrophy of the lateral segment of left lobe of liver and caudate lobe of liver  identified. Findings are suggestive of cirrhosis. Ill defined low-attenuation lesion within the right lobe of liver measures 1.8 cm, image 59 of series 2. This appears new from 10/08/2014 and is indeterminate. Posterior right lobe of liver low-attenuation structure measures 1.5 cm, image 65 of series 2. Also new from 10/08/2014. Gallbladder is normal. No biliary dilatation. Pancreas: No mass or inflammatory process identified on this un-enhanced exam. Spleen: The spleen is  enlarged measuring 17 cm in cranial caudal dimension. Adrenals/Urinary Tract: Normal adrenal glands. Unremarkable appearance of the right kidney peer the lower pole calculus measures 6 mm, image 77 of series 2. Mild diffuse bladder wall thickening. Small diverticula arises from the right lateral wall of the bladder, image 115 of series 2 Stomach/Bowel: The stomach is normal. The small bowel loops have a normal course and caliber. There is no evidence for bowel obstruction. No pathologic dilatation of the colon. Multiple distal colonic diverticula noted. Vascular/Lymphatic: Calcified atherosclerotic disease involves the abdominal aorta. No aneurysm. No adenopathy identified within the abdomen or pelvis. Reproductive: Prostate gland and seminal vesicles are unremarkable. Other: There is moderate ascites identified within the abdomen and pelvis. No focal fluid collections identified. Musculoskeletal: Spondylosis is identified within the lumbar spine. This is most advanced at the L5-S1 level. No aggressive lytic or sclerotic bone lesions noted. IMPRESSION: 1. No findings identified to explain patient's sepsis. 2. Morphologic features a liver are concerning for cirrhosis. 3. There are 2 indeterminate low-attenuation foci within the right lobe of liver, new from 10/08/2014. More definitive characterization with contrast enhanced MRI of the liver is suggested as patient's clinical condition tolerates. 4. Splenomegaly 5. Ascites 6. Bilateral pleural  effusions with mild bilateral lower lobe pneumonitis. Aeration to the lower lobes appear slightly improved from 03/12/2016. 7. Aortic atherosclerosis 8. Lumbar spondylosis. Electronically Signed   By: Kerby Moors M.D.   On: 04/07/2016 11:58   Ct Head Wo Contrast  04/07/2016  CLINICAL DATA:  Fever, confusion, elevated white cell count. Acute encephalopathy. EXAM: CT HEAD WITHOUT CONTRAST TECHNIQUE: Contiguous axial images were obtained from the base of the skull through the vertex without intravenous contrast. COMPARISON:  07/21/2014 FINDINGS: Examination is somewhat limited by motion artifact. Diffuse cerebral atrophy. Mild ventricular dilatation consistent with central atrophy. Low-attenuation changes in the deep white matter consistent small vessel ischemia. No mass effect or midline shift. No abnormal extra-axial fluid collections. Gray-white matter junctions are distinct. Basal cisterns are not effaced. No evidence of acute intracranial hemorrhage. No depressed skull fractures. Visualized paranasal sinuses and mastoid air cells are not opacified. IMPRESSION: No acute intracranial abnormalities. Mild chronic atrophy and small vessel ischemic changes. Electronically Signed   By: Lucienne Capers M.D.   On: 04/07/2016 03:14   Ct Chest Wo Contrast  04/07/2016  CLINICAL DATA:  Abdominal pain.  Mild diffuse abdominal tenderness. EXAM: CT CHEST, ABDOMEN AND PELVIS WITHOUT CONTRAST TECHNIQUE: Multidetector CT imaging of the chest, abdomen and pelvis was performed following the standard protocol without IV contrast. COMPARISON:  03/12/2016 FINDINGS: CT CHEST FINDINGS Mediastinum/Lymph Nodes: There is moderate cardiac enlargement. Calcification of the mitral valve noted. There is a prosthetic aortic valve. Previous median sternotomy and CABG procedure noted. The trachea appears patent and is midline. Normal appearance of the esophagus. No axillary or supraclavicular adenopathy. No mediastinal or hilar adenopathy.  Lungs/Pleura: Small pleural effusions are identified bilaterally. There are bilateral lower lobe pulmonary opacities which appear stable to slightly improved from previous exam, image 77 of series 4. Musculoskeletal: No chest wall mass or suspicious bone lesions identified. CT ABDOMEN PELVIS FINDINGS Hepatobiliary: Hypertrophy of the lateral segment of left lobe of liver and caudate lobe of liver identified. Findings are suggestive of cirrhosis. Ill defined low-attenuation lesion within the right lobe of liver measures 1.8 cm, image 59 of series 2. This appears new from 10/08/2014 and is indeterminate. Posterior right lobe of liver low-attenuation structure measures 1.5 cm, image 65 of series 2. Also new  from 10/08/2014. Gallbladder is normal. No biliary dilatation. Pancreas: No mass or inflammatory process identified on this un-enhanced exam. Spleen: The spleen is enlarged measuring 17 cm in cranial caudal dimension. Adrenals/Urinary Tract: Normal adrenal glands. Unremarkable appearance of the right kidney peer the lower pole calculus measures 6 mm, image 77 of series 2. Mild diffuse bladder wall thickening. Small diverticula arises from the right lateral wall of the bladder, image 115 of series 2 Stomach/Bowel: The stomach is normal. The small bowel loops have a normal course and caliber. There is no evidence for bowel obstruction. No pathologic dilatation of the colon. Multiple distal colonic diverticula noted. Vascular/Lymphatic: Calcified atherosclerotic disease involves the abdominal aorta. No aneurysm. No adenopathy identified within the abdomen or pelvis. Reproductive: Prostate gland and seminal vesicles are unremarkable. Other: There is moderate ascites identified within the abdomen and pelvis. No focal fluid collections identified. Musculoskeletal: Spondylosis is identified within the lumbar spine. This is most advanced at the L5-S1 level. No aggressive lytic or sclerotic bone lesions noted. IMPRESSION: 1.  No findings identified to explain patient's sepsis. 2. Morphologic features a liver are concerning for cirrhosis. 3. There are 2 indeterminate low-attenuation foci within the right lobe of liver, new from 10/08/2014. More definitive characterization with contrast enhanced MRI of the liver is suggested as patient's clinical condition tolerates. 4. Splenomegaly 5. Ascites 6. Bilateral pleural effusions with mild bilateral lower lobe pneumonitis. Aeration to the lower lobes appear slightly improved from 03/12/2016. 7. Aortic atherosclerosis 8. Lumbar spondylosis. Electronically Signed   By: Kerby Moors M.D.   On: 04/07/2016 11:58   US Paracentesis  04/08/2016  INDICATION: Patient with history of sepsis/bacteremia, mantle cell lymphoma, ascites. Request made for diagnostic and therapeutic paracentesis. EXAM: ULTRASOUND GUIDED DIAGNOSTIC AND THERAPEUTIC PARACENTESIS MEDICATIONS: None. COMPLICATIONS: None immediate. PROCEDURE: Informed written consent was obtained from the patient's family after a discussion of the risks, benefits and alternatives to treatment. A timeout was performed prior to the initiation of the procedure. Initial ultrasound scanning demonstrates a small amount of ascites within the right lower abdominal quadrant. The right lower abdomen was prepped and draped in the usual sterile fashion. 1% lidocaine was used for local anesthesia. Following this, a Yueh catheter was introduced. An ultrasound image was saved for documentation purposes. The paracentesis was performed. The catheter was removed and a dressing was applied. The patient tolerated the procedure well without immediate post procedural complication. FINDINGS: A total of approximately 1.2 liters of slightly hazy, yellow fluid was removed. Samples were sent to the laboratory as requested by the clinical team. IMPRESSION: Successful ultrasound-guided diagnostic and therapeutic paracentesis yielding 1.2 liters of peritoneal fluid. Read by:  Rowe Robert, PA-C Electronically Signed   By: Jacqulynn Cadet M.D.   On: 04/08/2016 11:46   Dg Chest Port 1 View  04/08/2016  CLINICAL DATA:  Wheezing, cough. EXAM: PORTABLE CHEST 1 VIEW COMPARISON:  Apr 06, 2016. FINDINGS: Stable cardiomegaly. Status post Coronary artery bypass graft. No pneumothorax or pleural effusion is noted. Stable interstitial densities are noted throughout both lungs most consistent with scarring. Bony thorax appears intact. IMPRESSION: Stable interstitial densities are noted throughout both lungs most consistent with scarring. No significant changes noted compared to prior exam. Electronically Signed   By: Marijo Conception, M.D.   On: 04/08/2016 09:19   Dg Chest Port 1 View  04/07/2016  CLINICAL DATA:  Fever for 48 hours. EXAM: PORTABLE CHEST 1 VIEW COMPARISON:  03/12/2016 FINDINGS: There is moderate cardiomegaly and aortic tortuosity, unchanged. Prior  sternotomy and CABG. No consolidation. No large effusions. Mild chronic appearing interstitial coarsening. IMPRESSION: Cardiomegaly.  No acute cardiopulmonary findings. Electronically Signed   By: Andreas Newport M.D.   On: 04/07/2016 00:34    Assessment/Plan 1. Localized edema No weight gain. Increase furosemide to 80 mg Tablet in the morning and 40 mg tablet in the afternoon. Increase Spironolactone to 25 mg Tablet daily. Continue Bilat. Ted hose on in AM and Off at bedtime.   2. Episodic confusion  Afebrile. Obtain Urine specimen for U/A and C/S rule out UTI      Family/ staff Communication:Reviewed plan of care with patient, patient's wife and facility Nurse supervisor.   Labs/tests ordered:  BMP 04/29/2016

## 2016-04-27 ENCOUNTER — Other Ambulatory Visit: Payer: Self-pay | Admitting: Internal Medicine

## 2016-04-27 ENCOUNTER — Ambulatory Visit (HOSPITAL_COMMUNITY)
Admission: RE | Admit: 2016-04-27 | Discharge: 2016-04-27 | Disposition: A | Discharge status: Home / Self Care | Payer: Medicare Other | Source: Ambulatory Visit | Attending: Internal Medicine | Admitting: Internal Medicine

## 2016-04-27 DIAGNOSIS — B999 Unspecified infectious disease: Secondary | ICD-10-CM

## 2016-04-27 MED ORDER — HEPARIN SOD (PORK) LOCK FLUSH 100 UNIT/ML IV SOLN
INTRAVENOUS | Status: DC
Start: 2016-04-27 — End: 2016-04-28
  Filled 2016-04-27: qty 5

## 2016-04-27 MED ORDER — LIDOCAINE HCL 1 % IJ SOLN
INTRAMUSCULAR | Status: AC
  Administered 2016-04-27: 3 mL
  Filled 2016-04-27: qty 20

## 2016-04-27 NOTE — Procedures (Signed)
Successful placement of single lumen PICC line to right basilic vein. Length 39 cm Tip at lower SVC/RA No complications Ready for use.  Soleia Badolato S Lashon Beringer PA-C     

## 2016-04-29 LAB — BASIC METABOLIC PANEL
BUN: 21 mg/dL (ref 4–21)
Creatinine: 1 mg/dL (ref 0.6–1.3)
GLUCOSE: 108 mg/dL
Potassium: 2.8 mmol/L — AB (ref 3.4–5.3)
SODIUM: 140 mmol/L (ref 137–147)

## 2016-04-29 LAB — CBC AND DIFFERENTIAL
HEMATOCRIT: 28 % — AB (ref 41–53)
Hemoglobin: 8.7 g/dL — AB (ref 13.5–17.5)
PLATELETS: 70 10*3/uL — AB (ref 150–399)
WBC: 4.2 10^3/mL

## 2016-04-30 LAB — BASIC METABOLIC PANEL
BUN: 21 mg/dL (ref 4–21)
Creatinine: 1.1 mg/dL (ref 0.6–1.3)
Glucose: 111 mg/dL
Potassium: 3.8 mmol/L (ref 3.4–5.3)
Sodium: 140 mmol/L (ref 137–147)

## 2016-05-05 ENCOUNTER — Encounter: Payer: Self-pay | Admitting: Cardiovascular Disease

## 2016-05-05 ENCOUNTER — Ambulatory Visit (INDEPENDENT_AMBULATORY_CARE_PROVIDER_SITE_OTHER): Payer: Medicare Other | Admitting: Cardiovascular Disease

## 2016-05-05 VITALS — BP 122/60 | HR 76 | Ht 72.0 in

## 2016-05-05 DIAGNOSIS — I5023 Acute on chronic systolic (congestive) heart failure: Secondary | ICD-10-CM | POA: Diagnosis not present

## 2016-05-05 NOTE — Progress Notes (Signed)
Cardiology Office Note Date:  05/05/2016   ID:  [name], DOB 1927-09-11, MRN [id number]  PCP:  Donnajean Lopes, MD  Cardiologist:  [name], MD    Chief Complaint  Patient presents with  . Severe Aortic Valve Disease s/p TAVR    Has LEE. denies cp,sob,claudication  . CAD s/p CABG  . Essential Hypertension  . Hyperlipidemia     History of Present Illness: [name] is a 82 y.o. male who presents for follow-up evaluation. He was last seen in the office 11-21-2015. The patient underwent TAVR 11-06-2014 for treatment of severe symptomatic aortic stenosis. He was treated with a 29 mm Sapien 3 transcatheter heart valve via an open right transfemoral approach. The patient has a history of coronary artery disease status post CABG. He underwent PCI of the saphenous vein graft RCA prior to TAVR because of critical in-stent restenosis in that graft. The patient's other medical problems include lymphoma status post Rituxan therapy and limited mobility after left first ray amputation in 2013.  He was recently hospitalized with fever and chills. He was treated for sepsis and noted to have enterococcus bacteremia. He underwent extensive evaluation and repeat blood cultures were negative. He underwent paracentesis with negative cultures. Infectious disease was consulted and recommended 6 weeks of empiric therapy to cover endocarditis considering his history of TAVR. A 2-D echocardiogram showed normal function of his transcatheter heart valve. However, there was interval development of severe LV dysfunction compared with his previous study. Conservative management was recommended a stone the patient's advanced age and his wishes.  Here with his wife today. Still having a tough time. Breathing has improved but significant leg discomfort related to swelling. Complains of swelling of both legs and unable to get his shoes on. Has had chronic leg swelling but states 'nothing like this.' No  chest pain, orthopnea or PND. Wife states pt with more cognitive problems related to dementia and medical illnesses.   Past Medical History  Diagnosis Date  . CAD (coronary artery disease)     a. s/p CABG 1995;  b. NSTEMI & subsequent BMS to SVG->right PDA 02/03/12.;  c. Cath 02/14/2012 3vd with 4/4 patent grafts and patent stent in vg->pda;  d. 10/2014 PCI/DES to the VG->PDA.  . Carotid stenosis     a. Carotid dopplers [id number]: RICA [id number], LICA [id number];  b. dopplers 3/14:  [id number] RICA, [id number] LICA  . HTN (hypertension)   . HLD (hyperlipidemia)   . Asthmatic bronchitis   . BPH (benign prostatic hypertrophy)   . Herpes zoster   . Anemia   . Thrombocytopenia ([place])     a. secondary to splenomegaly related to lymphoma with suspicion of bone marrow involvement. (Plavix had to be stopped due to this)  . Aortic stenosis, severe     a. ECHO 07/22/14 Severe AS. Peak and mean gradients of 57 mmHg and 42 mmHg, respectively. Calculated AVA is 0.8-0.9 cm2. Trivial AR. Valve area (VTI): 0.84 cm^2. Valve area (Vmax): 0.86 cm^2.. Aortic root dimension: 42 mm (ED) and aortic root mildly dilated;  b. 10/2014 S/P TAVR w/ Edwards Sapien 3 THV 3mm;  c. 10/2014 Echo: EF 50%, nl fxning AoV, mean grad of 9, peak of 16.  Marland Kitchen Splenomegaly   . COPD (chronic obstructive pulmonary disease) ([place])   . Peripheral vascular disease ([place])     a. s/p L ray amp 10/13;  b. s/p L 2nd toe amp 12/13  . Pulmonary nodule     a. 70mm  RUL calcified pulm nodule noted on CT staging for lymphoma - stable 10/2012.  . Leg DVT (deep venous thromboembolism), chronic ([place])     a. LE dopplers 5/13, 10/13 and 1/14: chronic DVT involving right mid femoral vein, left mid femoral vein, and left popliteal vein.  . Osteomyelitis ([place])     a. L first foot ray amputation 09/2012  . Heart murmur   . Anginal pain ([place])   . Myocardial infarction [facility]) ?1995  . Pneumonia ?2014  . DM2 (diabetes mellitus, type 2) ([place])   . DJD (degenerative joint disease)     . 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 Stage III s/p bendamustine, Rituxan therapy  . 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 29 mm Edwards Sapien 3 transcatheter heart valve placed via open right transfemoral approach    Past Surgical History  Procedure Laterality Date  . Coronary artery bypass graft  1995  . Shoulder arthroscopy w/ rotator cuff repair Right   . Carpal tunnel release Bilateral   . Incisional hernia repair    . Kidney surgery      "cut me 1/2 in 2 for stones"  . Back surgery    . I&d extremity  09/16/2012    Procedure: IRRIGATION AND DEBRIDEMENT EXTREMITY;  Surgeon: Wylene Simmer, MD;  Location: [facility];  Service: Orthopedics;  Laterality: Left;  irrigation and debridement and first Ray amputation of left foot  . Amputation  09/16/2012    Procedure: AMPUTATION RAY;  Surgeon: Wylene Simmer, MD;  Location: [facility];  Service: Orthopedics;  Laterality: Left;  first Ray amputation left foot  . Amputation  11/21/2012    Procedure: AMPUTATION RAY;  Surgeon: Wylene Simmer, MD;  Location: [place];  Service: Orthopedics;  Laterality: Left;  LEFT SECOND TOE AMPUTATION  . I&d extremity Left 03/01/2013    Procedure: IRRIGATION AND DEBRIDEMENT EXTREMITY;  Surgeon: Wylene Simmer, MD;  Location: [facility];  Service: Orthopedics;  Laterality: Left;  IRRIGATION  AND  DEBRIDEMENT  LEFT  FOOT  . Amputation Left 12/20/2013    Procedure: AMPUTATION LEFT 5TH TRANSMETATARSAL;  Surgeon: Wylene Simmer, MD;  Location: [place];  Service: Orthopedics;  Laterality: Left;  . Achilles tendon lengthening Left 12/20/2013    Procedure: ACHILLES TENDON LENGTHENING;  Surgeon: Wylene Simmer, MD;  Location: [place];  Service: Orthopedics;  Laterality: Left;  . Hernia repair    . Coronary angioplasty with stent placement  09/2014; 10/14/2014    "?2; 1"  . Cataract extraction w/ intraocular lens  implant, bilateral Bilateral   . Transcatheter aortic valve replacement, transfemoral  N/A 11/05/2014    Procedure: TRANSCATHETER AORTIC VALVE REPLACEMENT, TRANSFEMORAL;  Surgeon: [name], MD;  Location: [facility] OR; Berniece Pap 3 THV (size 29 mm, model # [id number], serial # [id number])   . Intraoperative transesophageal echocardiogram N/A 11/05/2014    Procedure: INTRAOPERATIVE TRANSESOPHAGEAL ECHOCARDIOGRAM;  Surgeon: [name], MD;  Location: [facility] OR;  Service: Open Heart Surgery;  Laterality: N/A;  . Left and right heart catheterization with coronary/graft angiogram N/A 02/03/2012    Procedure: LEFT AND RIGHT HEART CATHETERIZATION WITH Beatrix Fetters;  Surgeon: [name], MD;  Location: [facility] CATH LAB;  Service: Cardiovascular;  Laterality: N/A;  . Percutaneous stent intervention  02/03/2012    Procedure: PERCUTANEOUS STENT INTERVENTION;  Surgeon: [name], MD;  Location: [facility] CATH LAB;  Service: Cardiovascular;;  . Left heart catheterization with coronary/graft angiogram N/A 02/14/2012    Procedure:  LEFT HEART CATHETERIZATION WITH Beatrix Fetters;  Surgeon: Peter M Martinique, MD;  Location: [facility] CATH LAB;  Service: Cardiovascular;  Laterality: N/A;  . Left and right heart catheterization with coronary/graft angiogram N/A 09/23/2014    Procedure: LEFT AND RIGHT HEART CATHETERIZATION WITH Beatrix Fetters;  Surgeon: Blane Ohara, MD;  Location: [facility] CATH LAB;  Service: Cardiovascular;  Laterality: N/A;  . Percutaneous coronary stent intervention (pci-s)  10/14/2014    Procedure: PERCUTANEOUS CORONARY STENT INTERVENTION (PCI-S);  Surgeon: Blane Ohara, MD;  Location: [facility] CATH LAB;  Service: Cardiovascular;;    Current Outpatient Prescriptions  Medication Sig Dispense Refill  . ampicillin 2 g in sodium chloride 0.9 % 50 mL Inject 2 g into the vein every 4 (four) hours. Pharmacy to dose ampicillin, Last dose on 05/20/2016 2 g 0  . ANORO ELLIPTA 62.5-25 MCG/INH AEPB Inhale 1 puff into the lungs daily.     Marland Kitchen aspirin EC 81 MG tablet Take 81 mg by mouth  every morning.     Marland Kitchen atorvastatin (LIPITOR) 10 MG tablet Take 10 mg by mouth daily.    . carvedilol (COREG) 3.125 MG tablet Take 1 tablet (3.125 mg total) by mouth 2 (two) times daily with a meal. 60 tablet 0  . cefTRIAXone 2 g in dextrose 5 % 50 mL Inject 2 g into the vein every 12 (twelve) hours. End date 05/20/2016 2 g 0  . clopidogrel (PLAVIX) 75 MG tablet Take 75 mg by mouth daily.    . famotidine (PEPCID) 20 MG tablet Take 1 tablet (20 mg total) by mouth at bedtime. 30 tablet 0  . feeding supplement, ENSURE ENLIVE, (ENSURE ENLIVE) LIQD Take 237 mLs by mouth 3 (three) times daily between meals.    . furosemide (LASIX) 40 MG tablet Take 40 mg by mouth 2 (two) times daily.    Marland Kitchen gabapentin (NEURONTIN) 300 MG capsule Take 300 mg by mouth 2 (two) times daily.     Marland Kitchen guaiFENesin (MUCINEX) 600 MG 12 hr tablet Take 1 tablet (600 mg total) by mouth 2 (two) times daily. 30 tablet 0  . hydrALAZINE (APRESOLINE) 10 MG tablet Take 1 tablet (10 mg total) by mouth every 8 (eight) hours. 90 tablet 0  . hydrOXYzine (ATARAX/VISTARIL) 25 MG tablet Take 25 mg by mouth every 6 (six) hours as needed for itching.    Marland Kitchen ipratropium-albuterol (DUONEB) 0.5-2.5 (3) MG/3ML SOLN Take 3 mLs by nebulization every 6 (six) hours as needed.    . isosorbide mononitrate (IMDUR) 30 MG 24 hr tablet Take 30 mg by mouth daily.    . metFORMIN (GLUCOPHAGE) 500 MG tablet Take 500 mg by mouth 2 (two) times daily with a meal.    . nitroGLYCERIN (NITROSTAT) 0.4 MG SL tablet Place 1 tablet (0.4 mg total) under the tongue every 5 (five) minutes as needed for chest pain. 25 tablet 1  . OXYGEN Inhale 2 L into the lungs continuous.    . phenol (CHLORASEPTIC) 1.4 % LIQD Use as directed 2 sprays in the mouth or throat every 2 (two) hours as needed for throat irritation / pain.    Marland Kitchen saccharomyces boulardii (FLORASTOR) 250 MG capsule Take 250 mg by mouth 2 (two) times daily. Take one capsule twice daily for 10 days    . spironolactone (ALDACTONE) 25  MG tablet Take 0.5 tablets (12.5 mg total) by mouth daily. 30 tablet 0  . traMADol (ULTRAM) 50 MG tablet Take 1 tablet (50 mg total) by mouth every 12 (twelve)  hours as needed for moderate pain. 10 tablet 0  . vitamin B-12 (CYANOCOBALAMIN) 500 MCG tablet Take 1,000 mcg by mouth daily.      No current facility-administered medications for this visit.    Allergies:   Losartan; Azithromycin; Contrast media; Doxycycline; Keflex; Macrodantin; Minocycline hcl; Nitrofurantoin; and Zithromax   Social History:  The patient  reports that he quit smoking about 31 years ago. His smoking use included Cigarettes and Pipe. He has a 30 pack-year smoking history. He has quit using smokeless tobacco. His smokeless tobacco use included Chew. He reports that he does not drink alcohol or use illicit drugs.   Family History:  The patient's  family history includes Other in his father and mother.    ROS:  Please see the history of present illness.  Otherwise, review of systems is positive for fatigue.  All other systems are reviewed and negative.    PHYSICAL EXAM: VS:  BP 122/60 mmHg  Pulse 76  Ht 6' (1.829 m)  Wt  , BMI There is no weight on file to calculate BMI. GEN: Well nourished, well developed, in no acute distress HEENT: normal Neck: JVP elevated in the upright position No carotid bruits Cardiac: RRR without murmur or gallop                Respiratory:  clear to auscultation bilaterally, normal work of breathing GI: soft, nontender, nondistended, + BS MS: no deformity or atrophy Ext: 3+ pretibial edema to the knees Skin: warm and dry, no rash Neuro:  Strength and sensation are intact Psych: euthymic mood, full affect  EKG:  EKG is not ordered today.  Recent Labs: 04/08/2016: B Natriuretic Peptide 3365.9* 04/12/2016: ALT 40; Hemoglobin 10.4*; Platelets 80* 04/13/2016: BUN 47*; Creatinine, Ser 1.13; Magnesium 1.9; Potassium 4.1; Sodium 139   Lipid Panel     Component Value Date/Time   CHOL 111  02/02/2012 0910   TRIG 76 02/02/2012 0910   HDL 24* 02/02/2012 0910   CHOLHDL 4.6 02/02/2012 0910   VLDL 15 02/02/2012 0910   LDLCALC 72 02/02/2012 0910   LDLDIRECT 66.0 02/22/2008 0928      Wt Readings from Last 3 Encounters:  04/26/16 191 lb 12.8 oz (87 kg)  04/22/16 194 lb (87.998 kg)  04/19/16 190 lb (86.183 kg)     Cardiac Studies Reviewed: 2D Echo 04-08-2016: Study Conclusions  - Left ventricle: LVEF is seveely depressed with dyskinetic septal  motion. The cavity size was normal. Wall thickness was normal.  Systolic function was severely reduced. The estimated ejection  fraction was in the range of 25% to 30%. - Aortic valve: Peak and mean gradients through AV prosthesis are  17 and 10 mm Hg respectively. s/p TAVR. There was no  regurgitation. - Mitral valve: Moderately calcified, moderately thickened annulus.  Mildly thickened leaflets . There was mild regurgitation. - Left atrium: The atrium was severely dilated. - Right ventricle: The cavity size was moderately dilated. Wall  thickness was normal. Systolic function was moderately reduced. - Right atrium: The atrium was severely dilated. - Tricuspid valve: There was moderate regurgitation.  Impressions:  - Since echo from December 2016, LVEF is significantly depressed/  CT Chest/Abdomen/Pelvis 04-07-2016: IMPRESSION: 1. No findings identified to explain patient's sepsis. 2. Morphologic features a liver are concerning for cirrhosis. 3. There are 2 indeterminate low-attenuation foci within the right lobe of liver, new from 10/08/2014. More definitive characterization with contrast enhanced MRI of the liver is suggested as patient's clinical condition tolerates. 4.  Splenomegaly 5. Ascites 6. Bilateral pleural effusions with mild bilateral lower lobe pneumonitis. Aeration to the lower lobes appear slightly improved from 03/12/2016. 7. Aortic atherosclerosis 8. Lumbar spondylosis.  ASSESSMENT AND  PLAN: 1.  Acute on chronic systolic heart failure, NYHA III: feeling a little better but massively volume overloaded. Suspect combination of new LV dysfunction and volume resuscitation with recent sepsis. Recommend:  Increase furosemide to 80 mg BID  Add metolazone 2.5 mg QThur and Mon before am lasix dose  Hold hydralazine to allow BP for diuresis  2. Aortic valve disease s/p TAVR: concerned about enterococcal bacteremia, but normal valve function on surface echo. Pt is not currently a candidate for TEE and I don't think it will impact his treatment. 6 week treatment course of IV antibiotics planned. Will FU with an echo in a few months  3. CAD, native vessel, without angina. Continue medical management. Continues on ASA and plavix  4. Paroxysmal atrial fibrillation: not a candidate for anticoagulation. Continue to follow  Current medicines are reviewed with the patient today.  The patient does not have concerns regarding medicines.  Labs/ tests ordered today include:  No orders of the defined types were placed in this encounter.    Disposition:   I will see back next week in the office to see how he has responded to increased diuretics. Will check a renal panel at that time.  Deatra James, MD  05/05/2016 2:29 PM    [address], [place], [place]  [address] Phone: [phone]; Fax: [phone]

## 2016-05-05 NOTE — Patient Instructions (Signed)
Medication Instructions:  Your physician has recommended you make the following change in your medication:  1. INCREASE Furosemide to 80mg  take one tablet by mouth twice a day 2. STOP Hydralazine 3. START Metolazone 2.5mg  take one tablet by mouth 30 minutes prior to your morning dosage of Furosemide on Thursday and Monday  Labwork: Your physician recommends that you return for lab work: 05/14/16 (BMP)  Testing/Procedures: No new orders.   Follow-Up: Your physician recommends that you schedule a follow-up appointment in: [place] with Dr Burt Knack   Any Other Special Instructions Will Be Listed Below (If Applicable).     If you need a refill on your cardiac medications before your next appointment, please call your pharmacy.

## 2016-05-12 ENCOUNTER — Encounter: Payer: Self-pay | Admitting: Family

## 2016-05-12 ENCOUNTER — Non-Acute Institutional Stay (SKILLED_NURSING_FACILITY): Payer: Medicare Other | Admitting: Family

## 2016-05-12 DIAGNOSIS — R600 Localized edema: Secondary | ICD-10-CM

## 2016-05-12 DIAGNOSIS — S81801A Unspecified open wound, right lower leg, initial encounter: Secondary | ICD-10-CM

## 2016-05-12 DIAGNOSIS — S0083XA Contusion of other part of head, initial encounter: Secondary | ICD-10-CM | POA: Diagnosis not present

## 2016-05-12 DIAGNOSIS — W19XXXA Unspecified fall, initial encounter: Secondary | ICD-10-CM

## 2016-05-12 DIAGNOSIS — R296 Repeated falls: Secondary | ICD-10-CM | POA: Diagnosis not present

## 2016-05-12 DIAGNOSIS — R05 Cough: Secondary | ICD-10-CM | POA: Diagnosis not present

## 2016-05-12 DIAGNOSIS — R059 Cough, unspecified: Secondary | ICD-10-CM

## 2016-05-12 DIAGNOSIS — S81811A Laceration without foreign body, right lower leg, initial encounter: Secondary | ICD-10-CM

## 2016-05-12 NOTE — Progress Notes (Signed)
Location:  [facility] Room Number: [place]:  SNF [phone]) Provider:  Marlowe Sax, NP  Donnajean Lopes, MD  Patient Care Team: Leanna Battles, MD as PCP - General (Internal Medicine)  Extended Emergency Contact Information Primary Emergency Contact: Forestine Chute Address: [address]          [place], [place] [address] [name] of [place] Phone: [phone] Mobile Phone: [phone] Relation: Spouse Secondary Emergency Contact: Brownfield,Cindy Address: [address]          [place], [place] [address] [name] of [place] Phone: [phone] Mobile Phone: [phone] Relation: Daughter  Code Status:  Full Code Goals of care: Advanced Directive information Advanced Directives 04/19/2016  Does patient have an advance directive? Yes  Type of Advance Directive Out of facility DNR (pink MOST or yellow form)  Copy of advanced directive(s) in chart? Yes  Pre-existing out of facility DNR order (yellow form or pink MOST form) Yellow form placed in chart (order not valid for inpatient use)     Chief Complaint  Patient presents with  . Acute Visit    Acute    HPI:  Pt is a 82 y.o. male seen today at [facility] for an acute visit for evaluation of cough. He is seen in his room today with wife at bedside. Patient's wife states patient fell off the bed and bruised his right side of the face. He states thought he was at home on his big bed. He denies hitting head on the floor or any other injuries. He reports no pain.Facility staff reports non-productive cough and persistent lower extremities edema. He is currently on Furosemide 80 mg tablet twice daily and Zaroxolyn 2.5 mg tablet. Bilateral Ted hose not in place this visit.He is still on I.V Ampicillin 2 gm and Ceftriaxone 2 G until 05/20/2016 for endocarditis of aortic prosthetic valve.    Past Medical History  Diagnosis Date  . CAD (coronary artery  disease)     a. s/p CABG 1995;  b. NSTEMI & subsequent BMS to SVG->right PDA 02/03/12.;  c. Cath 02/14/2012 3vd with 4/4 patent grafts and patent stent in vg->pda;  d. 10/2014 PCI/DES to the VG->PDA.  . Carotid stenosis     a. Carotid dopplers [id number]: RICA [id number], LICA [id number];  b. dopplers 3/14:  123456 RICA, [id number] LICA  . HTN (hypertension)   . HLD (hyperlipidemia)   . Asthmatic bronchitis   . BPH (benign prostatic hypertrophy)   . Herpes zoster   . Anemia   . Thrombocytopenia ([place])     a. secondary to splenomegaly related to lymphoma with suspicion of bone marrow involvement. (Plavix had to be stopped due to this)  . Aortic stenosis, severe     a. ECHO 07/22/14 Severe AS. Peak and mean gradients of 57 mmHg and 42 mmHg, respectively. Calculated AVA is 0.8-0.9 cm2. Trivial AR. Valve area (VTI): 0.84 cm^2. Valve area (Vmax): 0.86 cm^2.. Aortic root dimension: 42 mm (ED) and aortic root mildly dilated;  b. 10/2014 S/P TAVR w/ Edwards Sapien 3 THV 106mm;  c. 10/2014 Echo: EF 50%, nl fxning AoV, mean grad of 9, peak of 16.  Marland Kitchen Splenomegaly   . COPD (chronic obstructive pulmonary disease) ([place])   . Peripheral vascular disease ([place])     a. s/p L ray amp 10/13;  b. s/p L 2nd toe amp 12/13  . Pulmonary nodule     a. 48mm RUL calcified pulm nodule noted  on CT staging for lymphoma - stable 10/2012.  . Leg DVT (deep venous thromboembolism), chronic ([place])     a. LE dopplers 5/13, 10/13 and 1/14: chronic DVT involving right mid femoral vein, left mid femoral vein, and left popliteal vein.  . Osteomyelitis ([place])     a. L first foot ray amputation 09/2012  . Heart murmur   . Anginal pain ([place])   . Myocardial infarction [facility]) ?1995  . Pneumonia ?2014  . DM2 (diabetes mellitus, type 2) ([place])   . DJD (degenerative joint disease)   . 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 Stage III s/p bendamustine, Rituxan therapy  . 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 29  mm Edwards Sapien 3 transcatheter heart valve placed via open right transfemoral approach   Past Surgical History  Procedure Laterality Date  . Coronary artery bypass graft  1995  . Shoulder arthroscopy w/ rotator cuff repair Right   . Carpal tunnel release Bilateral   . Incisional hernia repair    . Kidney surgery      "cut me 1/2 in 2 for stones"  . Back surgery    . I&d extremity  09/16/2012    Procedure: IRRIGATION AND DEBRIDEMENT EXTREMITY;  Surgeon: Wylene Simmer, MD;  Location: [facility];  Service: Orthopedics;  Laterality: Left;  irrigation and debridement and first Ray amputation of left foot  . Amputation  09/16/2012    Procedure: AMPUTATION RAY;  Surgeon: Wylene Simmer, MD;  Location: [facility];  Service: Orthopedics;  Laterality: Left;  first Ray amputation left foot  . Amputation  11/21/2012    Procedure: AMPUTATION RAY;  Surgeon: Wylene Simmer, MD;  Location: [facility];  Service: Orthopedics;  Laterality: Left;  LEFT SECOND TOE AMPUTATION  . I&d extremity Left 03/01/2013    Procedure: IRRIGATION AND DEBRIDEMENT EXTREMITY;  Surgeon: Wylene Simmer, MD;  Location: [facility];  Service: Orthopedics;  Laterality: Left;  IRRIGATION  AND  DEBRIDEMENT  LEFT  FOOT  . Amputation Left 12/20/2013    Procedure: AMPUTATION LEFT 5TH TRANSMETATARSAL;  Surgeon: Wylene Simmer, MD;  Location: [facility];  Service: Orthopedics;  Laterality: Left;  . Achilles tendon lengthening Left 12/20/2013    Procedure: ACHILLES TENDON LENGTHENING;  Surgeon: Wylene Simmer, MD;  Location: [facility];  Service: Orthopedics;  Laterality: Left;  . Hernia repair    . Coronary angioplasty with stent placement  09/2014; 10/14/2014    "?2; 1"  . Cataract extraction w/ intraocular lens  implant, bilateral Bilateral   . Transcatheter aortic valve replacement, transfemoral N/A 11/05/2014    Procedure: TRANSCATHETER AORTIC VALVE REPLACEMENT, TRANSFEMORAL;  Surgeon: [name], MD;  Location: [facility] OR; Berniece Pap 3 THV (size 29 mm, model # [id number],  serial # [id number])   . Intraoperative transesophageal echocardiogram N/A 11/05/2014    Procedure: INTRAOPERATIVE TRANSESOPHAGEAL ECHOCARDIOGRAM;  Surgeon: [name], MD;  Location: [facility] OR;  Service: Open Heart Surgery;  Laterality: N/A;  . Left and right heart catheterization with coronary/graft angiogram N/A 02/03/2012    Procedure: LEFT AND RIGHT HEART CATHETERIZATION WITH Beatrix Fetters;  Surgeon: [name], MD;  Location: [facility] CATH LAB;  Service: Cardiovascular;  Laterality: N/A;  . Percutaneous stent intervention  02/03/2012    Procedure: PERCUTANEOUS STENT INTERVENTION;  Surgeon: [name], MD;  Location: [facility] CATH LAB;  Service: Cardiovascular;;  . Left heart catheterization with coronary/graft angiogram N/A 02/14/2012    Procedure: LEFT HEART CATHETERIZATION WITH CORONARY/GRAFT ANGIOGRAM;  Surgeon: Peter M Martinique, MD;  Location: [facility] CATH LAB;  Service: Cardiovascular;  Laterality: N/A;  . Left and right heart catheterization with coronary/graft angiogram N/A 09/23/2014    Procedure: LEFT AND RIGHT HEART CATHETERIZATION WITH Beatrix Fetters;  Surgeon: Blane Ohara, MD;  Location: [facility] CATH LAB;  Service: Cardiovascular;  Laterality: N/A;  . Percutaneous coronary stent intervention (pci-s)  10/14/2014    Procedure: PERCUTANEOUS CORONARY STENT INTERVENTION (PCI-S);  Surgeon: Blane Ohara, MD;  Location: [facility] CATH LAB;  Service: Cardiovascular;;    Allergies  Allergen Reactions  . Losartan Other (See Comments)    Hyperkalemia > 7  . Azithromycin Itching  . Contrast Media [Iodinated Diagnostic Agents] Rash  . Doxycycline Itching       . Keflex [Cephalexin] Itching  . Macrodantin Itching  . Minocycline Hcl Itching  . Nitrofurantoin Itching  . Zithromax [Azithromycin Dihydrate] Itching      Medication List       This list is accurate as of: 05/12/16 11:27 AM.  Always use your most recent med list.               ampicillin 2 g in sodium chloride 0.9 %  50 mL  Inject 2 g into the vein every 4 (four) hours. Pharmacy to dose ampicillin, Last dose on 05/20/2016     ANORO ELLIPTA 62.5-25 MCG/INH Aepb  Generic drug:  umeclidinium-vilanterol  Inhale 1 puff into the lungs daily.     aspirin EC 81 MG tablet  Take 81 mg by mouth every morning.     atorvastatin 10 MG tablet  Commonly known as:  LIPITOR  Take 10 mg by mouth daily.     carvedilol 3.125 MG tablet  Commonly known as:  COREG  Take 1 tablet (3.125 mg total) by mouth 2 (two) times daily with a meal.     cefTRIAXone 2 g in dextrose 5 % 50 mL  Inject 2 g into the vein every 12 (twelve) hours. End date 05/20/2016     clopidogrel 75 MG tablet  Commonly known as:  PLAVIX  Take 75 mg by mouth daily.     docusate sodium 100 MG capsule  Commonly known as:  COLACE  Take 100 mg by mouth daily.     famotidine 20 MG tablet  Commonly known as:  PEPCID  Take 1 tablet (20 mg total) by mouth at bedtime.     ferrous sulfate 325 (65 FE) MG tablet  Take 325 mg by mouth daily with breakfast.     furosemide 80 MG tablet  Commonly known as:  LASIX  Take 1 tablet (80 mg total) by mouth 2 (two) times daily.     gabapentin 300 MG capsule  Commonly known as:  NEURONTIN  Take 300 mg by mouth 2 (two) times daily.     guaiFENesin 600 MG 12 hr tablet  Commonly known as:  MUCINEX  Take 1 tablet (600 mg total) by mouth 2 (two) times daily.     hydrOXYzine 25 MG tablet  Commonly known as:  ATARAX/VISTARIL  Take 25 mg by mouth every 6 (six) hours as needed for itching.     ipratropium-albuterol 0.5-2.5 (3) MG/3ML Soln  Commonly known as:  DUONEB  Take 3 mLs by nebulization every 6 (six) hours as needed.     isosorbide mononitrate 30 MG 24 hr tablet  Commonly known as:  IMDUR  Take 30 mg by mouth daily.     metFORMIN 500 MG tablet  Commonly known as:  GLUCOPHAGE  Take 500 mg by mouth 2 (two) times daily with a meal.     metolazone 2.5 MG tablet  Commonly known as:  ZAROXOLYN  Take one  tablet by mouth 30 minutes prior to morning dosage of Furosemide on Thursday and Monday     multivitamin with minerals tablet  Take 1 tablet by mouth daily.     nitroGLYCERIN 0.4 MG SL tablet  Commonly known as:  NITROSTAT  Place 1 tablet (0.4 mg total) under the tongue every 5 (five) minutes as needed for chest pain.     NUTRITIONAL SUPPLEMENT PO  Give 60 cc of Med Pass three times daily     nystatin cream  Commonly known as:  MYCOSTATIN  Apply 1 application topically 2 (two) times daily. Apply to buttock crease for fungal rash.     OXYGEN  Inhale 2 L into the lungs continuous.     phenol 1.4 % Liqd  Commonly known as:  CHLORASEPTIC  Use as directed 2 sprays in the mouth or throat every 2 (two) hours as needed for throat irritation / pain.     potassium chloride SA 20 MEQ tablet  Commonly known as:  K-DUR,KLOR-CON  Take 20 mEq by mouth daily.     spironolactone 25 MG tablet  Commonly known as:  ALDACTONE  Take 0.5 tablets (12.5 mg total) by mouth daily.     traMADol 50 MG tablet  Commonly known as:  ULTRAM  Take 1 tablet (50 mg total) by mouth every 12 (twelve) hours as needed for moderate pain.     vitamin B-12 500 MCG tablet  Commonly known as:  CYANOCOBALAMIN  Take 1,000 mcg by mouth daily.        Review of Systems  Constitutional: Negative for fever, chills, activity change, appetite change and fatigue.  HENT: Negative for congestion, rhinorrhea, sinus pressure and sore throat.   Eyes: Negative.   Respiratory: Negative for chest tightness, shortness of breath, wheezing and stridor.        Non-productive cough   Cardiovascular: Positive for leg swelling. Negative for chest pain and palpitations.  Gastrointestinal: Negative for nausea, vomiting, abdominal pain, diarrhea, constipation and abdominal distention.  Genitourinary: Negative for dysuria, urgency, frequency and flank pain.  Musculoskeletal: Positive for gait problem.  Skin: Negative for color change,  pallor and rash.       Right top of the foot skin tear  Neurological: Negative for dizziness, light-headedness and headaches.  Psychiatric/Behavioral: Negative for hallucinations, confusion, sleep disturbance and agitation. The patient is not nervous/anxious.     Immunization History  Administered Date(s) Administered  . Influenza Split 11/15/2012  . Influenza-Unspecified 09/05/2014  . PPD Test 04/13/2016   Pertinent  Health Maintenance Due  Topic Date Due  . OPHTHALMOLOGY EXAM  12/06/2016 (Originally 08/20/1937)  . URINE MICROALBUMIN  12/06/2016 (Originally 08/20/1937)  . PNA vac Low Risk Adult (1 of 2 - PCV13) 04/15/2017 (Originally [birth year]1994)  . INFLUENZA VACCINE  07/06/2016  . HEMOGLOBIN A1C  09/12/2016  . FOOT EXAM  04/19/2017   Fall Risk  01/08/2014  Falls in the past year? Yes  Number falls in past yr: 1  Injury with Fall? No  Risk Factor Category  High Fall Risk  Risk for fall due to : History of fall(s);Impaired balance/gait;Impaired mobility   Functional Status Survey:    Filed Vitals:   05/12/16 1035  BP: 134/72  Pulse: 80  Temp: 98 F (36.7 C)  Resp: 20  Height: 6' (1.829 m)  Weight: 181 lb 6.4 oz (82.283 kg)  SpO2: 95%   Body mass index is 24.6 kg/(m^2). Physical Exam  Constitutional: He appears well-developed and well-nourished. No distress.  HENT:  Head: Normocephalic.  Mouth/Throat: Oropharynx is clear and moist.  Eyes: Conjunctivae and EOM are normal. Pupils are equal, round, and reactive to light. Right eye exhibits no discharge. Left eye exhibits no discharge. No scleral icterus.  Neck: Normal range of motion. No JVD present. No thyromegaly present.  Cardiovascular: Normal rate, regular rhythm, normal heart sounds and intact distal pulses.  Exam reveals no gallop and no friction rub.   No murmur heard. Pulmonary/Chest: Effort normal. No respiratory distress. He has no wheezes. He has no rales.  Bilateral diminished breath sounds.   Abdominal: Soft.  Bowel sounds are normal. He exhibits no distension and no mass. There is no tenderness. There is no rebound and no guarding.  Musculoskeletal: Normal range of motion. He exhibits no tenderness.  Left foot amputee. Bilateral +3 edema. Uses wheelchair  Lymphadenopathy:    He has no cervical adenopathy.  Neurological: He is alert.  Skin: Skin is warm and dry. No rash noted. No erythema. No pallor.  Right top of foot skin tear wound bed red without any drainage or odor.   Psychiatric: He has a normal mood and affect.    Labs reviewed:  Recent Labs  04/11/16 0509 04/12/16 0525  04/13/16 0525 04/19/16 04/29/16 04/30/16  NA 138 138  < > 139 136* 140 140  K 3.3* 4.7  --  4.1 3.5 2.8* 3.8  CL 104 106  --  105  --   --   --   CO2 25 18*  --  24  --   --   --   GLUCOSE 133* 197*  --  201*  --   --   --   BUN 55* 49*  < > 47* 17 21 21   CREATININE 1.25* 1.15  < > 1.13 1.0 1.0 1.1  CALCIUM 8.8* 8.8*  --  9.1  --   --   --   MG  --   --   --  1.9  --   --   --   < > = values in this interval not displayed.  Recent Labs  04/10/16 0514 04/11/16 0509 04/12/16 0525 04/19/16  AST 47* 45* 36 19  ALT 49 48 40 14  ALKPHOS 172* 160* 133* 113  BILITOT 1.0 0.9 0.8  --   PROT 4.9* 5.1* 4.9*  --   ALBUMIN 3.1* 3.1* 2.9*  --     Recent Labs  04/07/16 0013 04/07/16 0421  04/10/16 0514  04/12/16 0919 04/12/16 1332 04/19/16 04/29/16  WBC 12.9* 15.4*  < > 7.0  < > 5.2 7.7 6.4 4.2  NEUTROABS 11.0* 13.1*  --   --   --   --   --   --   --   HGB 9.2* 9.4*  < > 9.4*  --  9.9* 10.4* 8.8* 8.7*  HCT 27.7* 28.6*  < > 28.2*  --  29.6* 31.6* 28* 28*  MCV 81.0 82.7  < > 81.7  --  83.1 84.0  --   --   PLT 74* 60*  < > 71*  --  52* 80* 50* 70*  < > = values in this interval not displayed. Lab Results  Component Value Date   TSH 1.290 02/02/2012   Lab Results  Component Value Date  HGBA1C 6.3* 03/13/2016   Lab Results  Component Value Date   CHOL 111 02/02/2012   HDL 24* 02/02/2012   LDLCALC 72  02/02/2012   LDLDIRECT 66.0 02/22/2008   TRIG 76 02/02/2012   CHOLHDL 4.6 02/02/2012    Significant Diagnostic Results in last 30 days:  Ir Fluoro Guide Cv Line Right  04/27/2016  INDICATION: Bacteremia. Previously placed PICC accidentally pulled. Request for replacement of PICC for continued intravenous antibiotics. EXAM: RIGHT UPPER EXTREMITY PICC LINE PLACEMENT WITH ULTRASOUND AND FLUOROSCOPIC GUIDANCE MEDICATIONS: 1% Lidocaine. ANESTHESIA/SEDATION: No sedation medication given. Marland Kitchen FLUOROSCOPY TIME:  Fluoroscopy Time: 0 minutes 18 seconds. COMPLICATIONS: None immediate. PROCEDURE: The patient was advised of the possible risks and complications and agreed to undergo the procedure. The patient was then brought to the angiographic suite for the procedure. The right arm was prepped with chlorhexidine, draped in the usual sterile fashion using maximum barrier technique (cap and mask, sterile gown, sterile gloves, large sterile sheet, hand hygiene and cutaneous antisepsis) and infiltrated locally with 1% Lidocaine. Ultrasound demonstrated patency of the right basilic vein, and this was documented with an image. Under real-time ultrasound guidance, this vein was accessed with a 21 gauge micropuncture needle and image documentation was performed. A 0.018 wire was introduced in to the vein. Over this, a 5 [place] single lumen power-injectable PICC was advanced to the lower SVC/right atrial junction. Fluoroscopy during the procedure and fluoro spot radiograph confirms appropriate catheter position. The catheter was flushed and covered with a sterile dressing. Catheter length:  39 cm IMPRESSION: Successful right arm Power PICC line placement with ultrasound and fluoroscopic guidance. The catheter is ready for use. Read by:  Gareth Eagle, PA-C Electronically Signed   By: Markus Daft M.D.   On: 04/27/2016 14:09   Ir US Guide Vasc Access Right  04/27/2016  INDICATION: Bacteremia. Previously placed PICC accidentally  pulled. Request for replacement of PICC for continued intravenous antibiotics. EXAM: RIGHT UPPER EXTREMITY PICC LINE PLACEMENT WITH ULTRASOUND AND FLUOROSCOPIC GUIDANCE MEDICATIONS: 1% Lidocaine. ANESTHESIA/SEDATION: No sedation medication given. Marland Kitchen FLUOROSCOPY TIME:  Fluoroscopy Time: 0 minutes 18 seconds. COMPLICATIONS: None immediate. PROCEDURE: The patient was advised of the possible risks and complications and agreed to undergo the procedure. The patient was then brought to the angiographic suite for the procedure. The right arm was prepped with chlorhexidine, draped in the usual sterile fashion using maximum barrier technique (cap and mask, sterile gown, sterile gloves, large sterile sheet, hand hygiene and cutaneous antisepsis) and infiltrated locally with 1% Lidocaine. Ultrasound demonstrated patency of the right basilic vein, and this was documented with an image. Under real-time ultrasound guidance, this vein was accessed with a 21 gauge micropuncture needle and image documentation was performed. A 0.018 wire was introduced in to the vein. Over this, a 5 [place] single lumen power-injectable PICC was advanced to the lower SVC/right atrial junction. Fluoroscopy during the procedure and fluoro spot radiograph confirms appropriate catheter position. The catheter was flushed and covered with a sterile dressing. Catheter length:  39 cm IMPRESSION: Successful right arm Power PICC line placement with ultrasound and fluoroscopic guidance. The catheter is ready for use. Read by:  Gareth Eagle, PA-C Electronically Signed   By: Markus Daft M.D.   On: 04/27/2016 14:09    Assessment/Plan Edema  +2-3+ pitting edema. Ted hose off during visit.continue with  Furosemide 80 mg tablet twice daily and Zaroxolyn 2.5 mg tablet.Facility Wound Nurse to apply Bilateral Knee high Lower extremity Kerlix and ACE wrap, change DRSG  Q 3 days. Hold Ted hose for now.   Skin tear Right top of foot.No signs of infection. Cleanse with  saline,pat dry cover with Xerofoam. Change drsg Q3 days. Monitor for any signs of infections.    Cough  Non-productive. Bilat. Diminished lung sounds to Auscultation. Portable CXR Pa/Lat today r/o PNA and effusion.   Fall Episodes Fell off bed thought he was on his big bed at home. Fall and safety precautions. Encouraged to call for Nursing assistance instead of getting up by himself.   Bruise  Right side of face and peri-orbital. Non-tender purple bruise. No eye redness. Continue to monitor.    Family/ staff Communication: Reviewed plan with patient, patient's wife and facility Nurse supervisor.  Labs/tests ordered:  Portable CXR Pa/Lat

## 2016-05-13 DIAGNOSIS — R05 Cough: Secondary | ICD-10-CM | POA: Insufficient documentation

## 2016-05-13 DIAGNOSIS — R059 Cough, unspecified: Secondary | ICD-10-CM | POA: Insufficient documentation

## 2016-05-14 ENCOUNTER — Ambulatory Visit (INDEPENDENT_AMBULATORY_CARE_PROVIDER_SITE_OTHER): Payer: Medicare Other | Admitting: Cardiovascular Disease

## 2016-05-14 ENCOUNTER — Encounter: Payer: Self-pay | Admitting: Cardiovascular Disease

## 2016-05-14 VITALS — BP 116/64 | HR 69 | Ht 72.0 in | Wt 169.0 lb

## 2016-05-14 DIAGNOSIS — I5023 Acute on chronic systolic (congestive) heart failure: Secondary | ICD-10-CM | POA: Diagnosis not present

## 2016-05-14 DIAGNOSIS — E785 Hyperlipidemia, unspecified: Secondary | ICD-10-CM

## 2016-05-14 DIAGNOSIS — I10 Essential (primary) hypertension: Secondary | ICD-10-CM

## 2016-05-14 MED ORDER — PRAVASTATIN SODIUM 40 MG PO TABS: 40.0000 mg | ORAL_TABLET | Freq: Every evening | ORAL | Status: AC

## 2016-05-14 NOTE — Progress Notes (Signed)
Cardiology Office Note Date:  05/14/2016   ID:  CLINE GRAVATT, DOB 09/30/1927, MRN [id number]  PCP:  Donnajean Lopes, MD  Cardiologist:  [name], MD    Chief Complaint  Patient presents with  . Fatigue   History of Present Illness: [name] is a 82 y.o. male who presents for follow-up evaluation. He was just seen 05/05/2016 after a prolonged hospitalization with enterococcal sepsis.  The patient underwent TAVR 11-06-2014 for treatment of severe symptomatic aortic stenosis. He was treated with a 29 mm Sapien 3 transcatheter heart valve via an open right transfemoral approach. The patient has a history of coronary artery disease status post CABG. He underwent PCI of the saphenous vein graft RCA prior to TAVR because of critical in-stent restenosis in that graft. The patient's other medical problems include lymphoma status post Rituxan therapy and limited mobility after left first ray amputation in 2013.   During his hospitalization he underwent extensive evaluation and repeat blood cultures were negative. He underwent paracentesis with negative cultures. Infectious disease was consulted and recommended 6 weeks of empiric therapy to cover endocarditis considering his history of TAVR. A 2-D echocardiogram showed normal function of his transcatheter heart valve. However, there was interval development of severe LV dysfunction compared with his previous study. Conservative management was recommended   At the time of his recent office visit, he had marked volume overload. His furosemide dose was doubled and metolazone was added to his medical regimen. He returns today for close follow-up. The patient is doing better. He has suffered an intercurrent mechanical fall and has right facial bruising. His breathing is improved. Leg swelling is better. He denies orthopnea or PND. He is eager to get home from the skilled nursing facility. He is here with his wife today. He wants to come off of  oxygen. He is using it intermittently but doesn't feel like he needs it anymore. He denies chest pain or pressure.  Past Medical History  Diagnosis Date  . CAD (coronary artery disease)     a. s/p CABG 1995;  b. NSTEMI & subsequent BMS to SVG->right PDA 02/03/12.;  c. Cath 02/14/2012 3vd with 4/4 patent grafts and patent stent in vg->pda;  d. 10/2014 PCI/DES to the VG->PDA.  . Carotid stenosis     a. Carotid dopplers [id number]: RICA [id number], LICA [id number];  b. dopplers 3/14:  123456 RICA, [id number] LICA  . HTN (hypertension)   . HLD (hyperlipidemia)   . Asthmatic bronchitis   . BPH (benign prostatic hypertrophy)   . Herpes zoster   . Anemia   . Thrombocytopenia ([place])     a. secondary to splenomegaly related to lymphoma with suspicion of bone marrow involvement. (Plavix had to be stopped due to this)  . Aortic stenosis, severe     a. ECHO 07/22/14 Severe AS. Peak and mean gradients of 57 mmHg and 42 mmHg, respectively. Calculated AVA is 0.8-0.9 cm2. Trivial AR. Valve area (VTI): 0.84 cm^2. Valve area (Vmax): 0.86 cm^2.. Aortic root dimension: 42 mm (ED) and aortic root mildly dilated;  b. 10/2014 S/P TAVR w/ [place] Sapien 3 THV 71mm;  c. 10/2014 Echo: EF 50%, nl fxning AoV, mean grad of 9, peak of 16.  Marland Kitchen Splenomegaly   . COPD (chronic obstructive pulmonary disease) ([place])   . Peripheral vascular disease ([place])     a. s/p L ray amp 10/13;  b. s/p L 2nd toe amp 12/13  . Pulmonary nodule     a. 23mm RUL  calcified pulm nodule noted on CT staging for lymphoma - stable 10/2012.  . Leg DVT (deep venous thromboembolism), chronic ([place])     a. LE dopplers 5/13, 10/13 and 1/14: chronic DVT involving right mid femoral vein, left mid femoral vein, and left popliteal vein.  . Osteomyelitis ([place])     a. L first foot ray amputation 09/2012  . Heart murmur   . Anginal pain ([place])   . Myocardial infarction [facility]) ?1995  . Pneumonia ?2014  . DM2 (diabetes mellitus, type 2) ([place])   . DJD (degenerative joint disease)   .  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 Stage III s/p bendamustine, Rituxan therapy  . 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 29 mm [place] Sapien 3 transcatheter heart valve placed via open right transfemoral approach    Past Surgical History  Procedure Laterality Date  . Coronary artery bypass graft  1995  . Shoulder arthroscopy w/ rotator cuff repair Right   . Carpal tunnel release Bilateral   . Incisional hernia repair    . Kidney surgery      "cut me 1/2 in 2 for stones"  . Back surgery    . I&d extremity  09/16/2012    Procedure: IRRIGATION AND DEBRIDEMENT EXTREMITY;  Surgeon: Wylene Simmer, MD;  Location: [facility];  Service: Orthopedics;  Laterality: Left;  irrigation and debridement and first Ray amputation of left foot  . Amputation  09/16/2012    Procedure: AMPUTATION RAY;  Surgeon: Wylene Simmer, MD;  Location: [facility];  Service: Orthopedics;  Laterality: Left;  first Ray amputation left foot  . Amputation  11/21/2012    Procedure: AMPUTATION RAY;  Surgeon: Wylene Simmer, MD;  Location: [place];  Service: Orthopedics;  Laterality: Left;  LEFT SECOND TOE AMPUTATION  . I&d extremity Left 03/01/2013    Procedure: IRRIGATION AND DEBRIDEMENT EXTREMITY;  Surgeon: Wylene Simmer, MD;  Location: [facility];  Service: Orthopedics;  Laterality: Left;  IRRIGATION  AND  DEBRIDEMENT  LEFT  FOOT  . Amputation Left 12/20/2013    Procedure: AMPUTATION LEFT 5TH TRANSMETATARSAL;  Surgeon: Wylene Simmer, MD;  Location: [place];  Service: Orthopedics;  Laterality: Left;  . Achilles tendon lengthening Left 12/20/2013    Procedure: ACHILLES TENDON LENGTHENING;  Surgeon: Wylene Simmer, MD;  Location: [address];  Service: Orthopedics;  Laterality: Left;  . Hernia repair    . Coronary angioplasty with stent placement  09/2014; 10/14/2014    "?2; 1"  . Cataract extraction w/ intraocular lens  implant, bilateral Bilateral   . Transcatheter aortic valve replacement, transfemoral N/A  11/05/2014    Procedure: TRANSCATHETER AORTIC VALVE REPLACEMENT, TRANSFEMORAL;  Surgeon: [name], MD;  Location: [facility] OR; Berniece Pap 3 THV (size 29 mm, model # [id number], serial # [id number])   . Intraoperative transesophageal echocardiogram N/A 11/05/2014    Procedure: INTRAOPERATIVE TRANSESOPHAGEAL ECHOCARDIOGRAM;  Surgeon: [name], MD;  Location: [facility] OR;  Service: Open Heart Surgery;  Laterality: N/A;  . Left and right heart catheterization with coronary/graft angiogram N/A 02/03/2012    Procedure: LEFT AND RIGHT HEART CATHETERIZATION WITH Beatrix Fetters;  Surgeon: [name], MD;  Location: [facility] CATH LAB;  Service: Cardiovascular;  Laterality: N/A;  . Percutaneous stent intervention  02/03/2012    Procedure: PERCUTANEOUS STENT INTERVENTION;  Surgeon: [name], MD;  Location: [facility] CATH LAB;  Service: Cardiovascular;;  . Left heart catheterization with coronary/graft angiogram N/A 02/14/2012    Procedure: LEFT HEART  CATHETERIZATION WITH Beatrix Fetters;  Surgeon: Peter M Martinique, MD;  Location: [facility] CATH LAB;  Service: Cardiovascular;  Laterality: N/A;  . Left and right heart catheterization with coronary/graft angiogram N/A 09/23/2014    Procedure: LEFT AND RIGHT HEART CATHETERIZATION WITH Beatrix Fetters;  Surgeon: Blane Ohara, MD;  Location: [facility] CATH LAB;  Service: Cardiovascular;  Laterality: N/A;  . Percutaneous coronary stent intervention (pci-s)  10/14/2014    Procedure: PERCUTANEOUS CORONARY STENT INTERVENTION (PCI-S);  Surgeon: Blane Ohara, MD;  Location: [facility] CATH LAB;  Service: Cardiovascular;;    Current Outpatient Prescriptions  Medication Sig Dispense Refill  . ampicillin 2 g in sodium chloride 0.9 % 50 mL Inject 2 g into the vein every 4 (four) hours. Pharmacy to dose ampicillin, Last dose on 05/20/2016 2 g 0  . ANORO ELLIPTA 62.5-25 MCG/INH AEPB Inhale 1 puff into the lungs daily.     Marland Kitchen aspirin EC 81 MG tablet Take 81 mg by mouth every  morning.     . carvedilol (COREG) 3.125 MG tablet Take 1 tablet (3.125 mg total) by mouth 2 (two) times daily with a meal. 60 tablet 0  . cefTRIAXone 2 g in dextrose 5 % 50 mL Inject 2 g into the vein every 12 (twelve) hours. End date 05/20/2016 2 g 0  . clopidogrel (PLAVIX) 75 MG tablet Take 75 mg by mouth daily.     Marland Kitchen docusate sodium (COLACE) 100 MG capsule Take 100 mg by mouth daily.    . famotidine (PEPCID) 20 MG tablet Take 1 tablet (20 mg total) by mouth at bedtime. 30 tablet 0  . ferrous sulfate 325 (65 FE) MG tablet Take 325 mg by mouth daily with breakfast.    . furosemide (LASIX) 80 MG tablet Take 1 tablet (80 mg total) by mouth 2 (two) times daily.    Marland Kitchen gabapentin (NEURONTIN) 300 MG capsule Take 300 mg by mouth 2 (two) times daily.     Marland Kitchen guaiFENesin (MUCINEX) 600 MG 12 hr tablet Take 1 tablet (600 mg total) by mouth 2 (two) times daily. 30 tablet 0  . hydrOXYzine (ATARAX/VISTARIL) 25 MG tablet Take 25 mg by mouth every 6 (six) hours as needed for itching.    Marland Kitchen ipratropium-albuterol (DUONEB) 0.5-2.5 (3) MG/3ML SOLN Take 3 mLs by nebulization every 6 (six) hours as needed.     . isosorbide mononitrate (IMDUR) 30 MG 24 hr tablet Take 30 mg by mouth daily.     . metFORMIN (GLUCOPHAGE) 500 MG tablet Take 500 mg by mouth 2 (two) times daily with a meal.     . Multiple Vitamins-Minerals (MULTIVITAMIN WITH MINERALS) tablet Take 1 tablet by mouth daily.    . nitroGLYCERIN (NITROSTAT) 0.4 MG SL tablet Place 1 tablet (0.4 mg total) under the tongue every 5 (five) minutes as needed for chest pain. 25 tablet 1  . Nutritional Supplements (NUTRITIONAL SUPPLEMENT PO) Give 60 cc of Med Pass three times daily    . nystatin cream (MYCOSTATIN) Apply 1 application topically 2 (two) times daily. Apply to buttock crease for fungal rash.    . OXYGEN Inhale 2 L into the lungs continuous.    . phenol (CHLORASEPTIC) 1.4 % LIQD Use as directed 2 sprays in the mouth or throat every 2 (two) hours as needed for throat  irritation / pain.    . potassium chloride SA (K-DUR,KLOR-CON) 20 MEQ tablet Take 20 mEq by mouth daily.    Marland Kitchen spironolactone (ALDACTONE) 25 MG tablet Take 0.5 tablets (  12.5 mg total) by mouth daily. 30 tablet 0  . traMADol (ULTRAM) 50 MG tablet Take 1 tablet (50 mg total) by mouth every 12 (twelve) hours as needed for moderate pain. 10 tablet 0  . vitamin B-12 (CYANOCOBALAMIN) 500 MCG tablet Take 1,000 mcg by mouth daily.     . pravastatin (PRAVACHOL) 40 MG tablet Take 1 tablet (40 mg total) by mouth every evening. 90 tablet 3   No current facility-administered medications for this visit.    Allergies:   Losartan; Azithromycin; Contrast media; Doxycycline; Keflex; Macrodantin; Minocycline hcl; Nitrofurantoin; and Zithromax   Social History:  The patient  reports that he quit smoking about 31 years ago. His smoking use included Cigarettes and Pipe. He has a 30 pack-year smoking history. He has quit using smokeless tobacco. His smokeless tobacco use included Chew. He reports that he does not drink alcohol or use illicit drugs.   Family History:  The patient's  family history includes Other in his father and mother.   ROS:  Please see the history of present illness.  Otherwise, review of systems is positive for Cough, weakness, leg swelling, balance problems.  All other systems are reviewed and negative.   PHYSICAL EXAM: VS:  BP 116/64 mmHg  Pulse 69  Ht 6' (1.829 m)  Wt 169 lb (76.658 kg)  BMI 22.92 kg/m2  SpO2 97% , BMI Body mass index is 22.92 kg/(m^2). GEN: Pleasant elderly male, in no acute distress HEENT: Right periorbital ecchymoses are present Neck: no JVD, no masses.  Cardiac: RRR with 2/6 systolic ejection murmur at the right upper sternal border           Respiratory:  Coarse bilateral breath sounds with scattered rhonchi GI: soft, nontender, nondistended, + BS MS: no deformity or atrophy Ext: 2+ pretibial edema on the left and 1+ pretibial edema on the right improved from  previous bilaterally Skin: warm and dry, no rash Neuro:  Strength and sensation are intact Psych: euthymic mood, full affect  EKG:  EKG is not ordered today.  Recent Labs: 04/08/2016: B Natriuretic Peptide 3365.9* 04/13/2016: Magnesium 1.9 04/19/2016: ALT 14 04/29/2016: Hemoglobin 8.7*; Platelets 70* 04/30/2016: BUN 21; Creatinine 1.1; Potassium 3.8; Sodium 140   Lipid Panel     Component Value Date/Time   CHOL 111 02/02/2012 0910   TRIG 76 02/02/2012 0910   HDL 24* 02/02/2012 0910   CHOLHDL 4.6 02/02/2012 0910   VLDL 15 02/02/2012 0910   LDLCALC 72 02/02/2012 0910   LDLDIRECT 66.0 02/22/2008 0928      Wt Readings from Last 3 Encounters:  05/14/16 169 lb (76.658 kg)  05/12/16 181 lb 6.4 oz (82.283 kg)  04/26/16 191 lb 12.8 oz (87 kg)    ASSESSMENT AND PLAN: 1.  Acute on chronic systolic heart failure, NYHA 3: The patient is improved with increased diuresis. His weight is down over 10 pounds from last week. Recommend he continue furosemide 80 mg twice daily. He will stop metolazone. Will check his labs today to assess renal function. I would like him to follow-up in 4-6 weeks with a PA/NP.  2. Aortic valve disease status post TAVR: Depending on his clinical progress when he returns, would arrange an echocardiogram since he had enterococcal bacteremia. His valve function was normal on his surface echo in the hospital. His exam is unchanged.  3. Coronary artery disease, native vessel: No symptoms of angina.  4. Paroxysmal atrial fibrillation: The patient is not a candidate for a coagulation in the setting  of frailty, advanced age, and marked thrombocytopenia.  Current medicines are reviewed with the patient today.  The patient does not have concerns regarding medicines.  Labs/ tests ordered today include:   Orders Placed This Encounter  Procedures  . Basic Metabolic Panel (BMET)   Disposition:   FU 4-6 weeks with APP  Signed, [name], MD  05/14/2016 5:11 PM    [facility] [address], [place], [place]  [address] Phone: [phone]; Fax: [phone]

## 2016-05-14 NOTE — Patient Instructions (Signed)
Medication Instructions:  Your physician has recommended you make the following change in your medication:  1. STOP Metolazone, Continue Furosemide 80mg  twice a day 2. STOP Atorvastatin 3. START Pravastatin 40mg  take one tablet by mouth every evening  Labwork: Your physician recommends that you have lab work today: BMP  Testing/Procedures: No new orders.   Follow-Up: Your physician recommends that you schedule a follow-up appointment in: 4-6 WEEKS with PA/NP   Any Other Special Instructions Will Be Listed Below (If Applicable).     If you need a refill on your cardiac medications before your next appointment, please call your pharmacy.

## 2016-05-15 LAB — BASIC METABOLIC PANEL
BUN: 30 mg/dL — ABNORMAL HIGH (ref 7–25)
CALCIUM: 8.3 mg/dL — AB (ref 8.6–10.3)
CO2: 25 mmol/L (ref 20–31)
Chloride: 93 mmol/L — ABNORMAL LOW (ref 98–110)
Creat: 1.26 mg/dL — ABNORMAL HIGH (ref 0.70–1.11)
GLUCOSE: 140 mg/dL — AB (ref 65–99)
POTASSIUM: 3.5 mmol/L (ref 3.5–5.3)
SODIUM: 137 mmol/L (ref 135–146)

## 2016-05-19 ENCOUNTER — Non-Acute Institutional Stay (SKILLED_NURSING_FACILITY): Payer: Medicare Other | Admitting: Family

## 2016-05-19 ENCOUNTER — Encounter: Payer: Self-pay | Admitting: Family

## 2016-05-19 DIAGNOSIS — E1159 Type 2 diabetes mellitus with other circulatory complications: Secondary | ICD-10-CM

## 2016-05-19 DIAGNOSIS — R269 Unspecified abnormalities of gait and mobility: Secondary | ICD-10-CM | POA: Diagnosis not present

## 2016-05-19 DIAGNOSIS — E785 Hyperlipidemia, unspecified: Secondary | ICD-10-CM | POA: Diagnosis not present

## 2016-05-19 DIAGNOSIS — T826XXD Infection and inflammatory reaction due to cardiac valve prosthesis, subsequent encounter: Secondary | ICD-10-CM | POA: Diagnosis not present

## 2016-05-19 DIAGNOSIS — I2510 Atherosclerotic heart disease of native coronary artery without angina pectoris: Secondary | ICD-10-CM

## 2016-05-19 DIAGNOSIS — L8990 Pressure ulcer of unspecified site, unspecified stage: Secondary | ICD-10-CM

## 2016-05-19 DIAGNOSIS — D509 Iron deficiency anemia, unspecified: Secondary | ICD-10-CM | POA: Diagnosis not present

## 2016-05-19 DIAGNOSIS — I38 Endocarditis, valve unspecified: Secondary | ICD-10-CM

## 2016-05-19 DIAGNOSIS — I10 Essential (primary) hypertension: Secondary | ICD-10-CM

## 2016-05-19 DIAGNOSIS — I5022 Chronic systolic (congestive) heart failure: Secondary | ICD-10-CM | POA: Diagnosis not present

## 2016-05-19 DIAGNOSIS — N400 Enlarged prostate without lower urinary tract symptoms: Secondary | ICD-10-CM

## 2016-05-19 DIAGNOSIS — J449 Chronic obstructive pulmonary disease, unspecified: Secondary | ICD-10-CM | POA: Diagnosis not present

## 2016-05-19 NOTE — Progress Notes (Signed)
Patient ID: [name], male   DOB: 09-24-1927, 82 y.o.   MRN: [id number]  [facility]   [facility] Room Number: [id number] Place of Service:  SNF (31)  Provider: Marlowe Sax FNP-C   PCP: Donnajean Lopes, MD Patient Care Team: Leanna Battles, MD as PCP - General (Internal Medicine)  Extended Emergency Contact Information Primary Emergency Contact: Forestine Chute Address: [address]          [place], [place] [address] [name] of [place] Phone: [phone] Mobile Phone: [phone] Relation: Spouse Secondary Emergency Contact: Brownfield,Cindy Address: [address]          [place], [place] [address] [name] of [place] Phone: [phone] Mobile Phone: [phone] Relation: Daughter  Code Status: Full Code  Goals of care:  Advanced Directive information Advanced Directives 05/19/2016  Does patient have an advance directive? Yes  Type of Paramedic of [place];Living will;Out of facility DNR (pink MOST or yellow form)  Does patient want to make changes to advanced directive? No - Patient declined  Copy of advanced directive(s) in chart? Yes  Pre-existing out of facility DNR order (yellow form or pink MOST form) -     Allergies  Allergen Reactions  . Losartan Other (See Comments)    Hyperkalemia > 7  . Azithromycin Itching  . Contrast Media [Iodinated Diagnostic Agents] Rash  . Doxycycline Itching       . Keflex [Cephalexin] Itching  . Macrodantin Itching  . Minocycline Hcl Itching  . Nitrofurantoin Itching  . Zithromax [Azithromycin Dihydrate] Itching    Chief Complaint  Patient presents with  . Discharge Note    HPI:  82 y.o. male seen today at [facility] for discharge home. He was here for short term rehabilitation post hospitalization from 04/06/2016- 04/13/2016 for SIRS syndrome due to sepsis with septic shock. 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 He denies any acute issues. He has has worked well with PT/OT now stable for discharge home.He will be discharged home with Home health PT/OT to continue with ROM, Exercise, Gait stability and muscle strengthening. He does not require any DME has own walker.He will require [facility] Nurse for sacral wound care. Home health services will be arranged by facility social worker prior to discharge. Prescription medication will be written x 1 month supply for medication not listed as filled per patient 's wife. Note: she brought in a list of medication that she has already refilled at home.    Past Medical History  Diagnosis Date  . CAD (coronary artery disease)     a. s/p CABG 1995;  b. NSTEMI & subsequent BMS to SVG->right PDA 02/03/12.;  c. Cath 02/14/2012 3vd with 4/4 patent grafts and patent stent in vg->pda;  d. 10/2014 PCI/DES to the VG->PDA.  . Carotid stenosis     a. Carotid dopplers [id number]: RICA [id number], LICA [id number];  b. dopplers 3/14:  [id number] RICA, [id number] LICA  . HTN (hypertension)   . HLD (hyperlipidemia)   . Asthmatic bronchitis   . BPH (benign prostatic hypertrophy)   . Herpes zoster   . Anemia   . Thrombocytopenia ([place])  a. secondary to splenomegaly related to lymphoma with suspicion of bone marrow involvement. (Plavix had to be stopped due to this)  . Aortic stenosis, severe     a. ECHO 07/22/14 Severe AS. Peak and mean gradients of 57 mmHg and 42 mmHg, respectively. Calculated AVA is 0.8-0.9 cm2. Trivial AR. Valve area (VTI): 0.84 cm^2. Valve area (Vmax): 0.86 cm^2.. Aortic root dimension: 42 mm (ED) and aortic root mildly dilated;  b. 10/2014 S/P TAVR w/ Edwards Sapien 3 THV  17mm;  c. 10/2014 Echo: EF 50%, nl fxning AoV, mean grad of 9, peak of 16.  Marland Kitchen Splenomegaly   . COPD (chronic obstructive pulmonary disease) ([place])   . Peripheral vascular disease ([place])     a. s/p L ray amp 10/13;  b. s/p L 2nd toe amp 12/13  . Pulmonary nodule     a. 54mm RUL calcified pulm nodule noted on CT staging for lymphoma - stable 10/2012.  . Leg DVT (deep venous thromboembolism), chronic ([place])     a. LE dopplers 5/13, 10/13 and 1/14: chronic DVT involving right mid femoral vein, left mid femoral vein, and left popliteal vein.  . Osteomyelitis ([place])     a. L first foot ray amputation 09/2012  . Heart murmur   . Anginal pain ([place])   . Myocardial infarction [facility]) ?1995  . Pneumonia ?2014  . DM2 (diabetes mellitus, type 2) ([place])   . DJD (degenerative joint disease)   . 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 Stage III s/p bendamustine, Rituxan therapy  . 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 29 mm Edwards Sapien 3 transcatheter heart valve placed via open right transfemoral approach    Past Surgical History  Procedure Laterality Date  . Coronary artery bypass graft  1995  . Shoulder arthroscopy w/ rotator cuff repair Right   . Carpal tunnel release Bilateral   . Incisional hernia repair    . Kidney surgery      "cut me 1/2 in 2 for stones"  . Back surgery    . I&d extremity  09/16/2012    Procedure: IRRIGATION AND DEBRIDEMENT EXTREMITY;  Surgeon: Wylene Simmer, MD;  Location: [facility];  Service: Orthopedics;  Laterality: Left;  irrigation and debridement and first Ray amputation of left foot  . Amputation  09/16/2012    Procedure: AMPUTATION RAY;  Surgeon: Wylene Simmer, MD;  Location: [facility];  Service: Orthopedics;  Laterality: Left;  first Ray amputation left foot  . Amputation  11/21/2012    Procedure: AMPUTATION RAY;  Surgeon: Wylene Simmer, MD;  Location: [place];  Service: Orthopedics;  Laterality: Left;  LEFT SECOND TOE  AMPUTATION  . I&d extremity Left 03/01/2013    Procedure: IRRIGATION AND DEBRIDEMENT EXTREMITY;  Surgeon: Wylene Simmer, MD;  Location: [facility];  Service: Orthopedics;  Laterality: Left;  IRRIGATION  AND  DEBRIDEMENT  LEFT  FOOT  . Amputation Left 12/20/2013    Procedure: AMPUTATION LEFT 5TH TRANSMETATARSAL;  Surgeon: Wylene Simmer, MD;  Location: [place];  Service: Orthopedics;  Laterality: Left;  . Achilles tendon lengthening Left 12/20/2013    Procedure: ACHILLES TENDON LENGTHENING;  Surgeon: Wylene Simmer, MD;  Location: [facility];  Service: Orthopedics;  Laterality: Left;  . Hernia repair    . Coronary angioplasty with stent placement  09/2014; 10/14/2014    "?2; 1"  . Cataract extraction w/ intraocular lens  implant, bilateral Bilateral   . Transcatheter aortic valve replacement,  transfemoral N/A 11/05/2014    Procedure: TRANSCATHETER AORTIC VALVE REPLACEMENT, TRANSFEMORAL;  Surgeon: [name], MD;  Location: [place] [facility] OR; Berniece Pap 3 THV (size 29 mm, model # [id number], serial # [id number])   . Intraoperative transesophageal echocardiogram N/A 11/05/2014    Procedure: INTRAOPERATIVE TRANSESOPHAGEAL ECHOCARDIOGRAM;  Surgeon: [name], MD;  Location: [facility] OR;  Service: Open Heart Surgery;  Laterality: N/A;  . Left and right heart catheterization with coronary/graft angiogram N/A 02/03/2012    Procedure: LEFT AND RIGHT HEART CATHETERIZATION WITH Beatrix Fetters;  Surgeon: [name], MD;  Location: [facility] CATH LAB;  Service: Cardiovascular;  Laterality: N/A;  . Percutaneous stent intervention  02/03/2012    Procedure: PERCUTANEOUS STENT INTERVENTION;  Surgeon: [name], MD;  Location: [facility] CATH LAB;  Service: Cardiovascular;;  . Left heart catheterization with coronary/graft angiogram N/A 02/14/2012    Procedure: LEFT HEART CATHETERIZATION WITH Beatrix Fetters;  Surgeon: Peter M Martinique, MD;  [facility] LAB;  Service: Cardiovascular;  Laterality: N/A;  . Left and right heart  catheterization with coronary/graft angiogram N/A 09/23/2014    Procedure: LEFT AND RIGHT HEART CATHETERIZATION WITH Beatrix Fetters;  Surgeon: Blane Ohara, MD;  Location: [facility] CATH LAB;  Service: Cardiovascular;  Laterality: N/A;  . Percutaneous coronary stent intervention (pci-s)  10/14/2014    Procedure: PERCUTANEOUS CORONARY STENT INTERVENTION (PCI-S);  Surgeon: Blane Ohara, MD;  Location: [facility] CATH LAB;  Service: Cardiovascular;;      reports that he quit smoking about 31 years ago. His smoking use included Cigarettes and Pipe. He has a 30 pack-year smoking history. He has quit using smokeless tobacco. His smokeless tobacco use included Chew. He reports that he does not drink alcohol or use illicit drugs. Social History   Social History  . Marital Status: Married    Spouse Name: N/A  . Number of Children: N/A  . Years of Education: N/A   Occupational History  . retired [employer]   Social History Main Topics  . Smoking status: Former Smoker -- 1.00 packs/day for 30 years    Types: Cigarettes, Pipe    Quit date: 03/29/1985  . Smokeless tobacco: Former Systems developer    Types: Chew     Comment: "chewed for ~ 1 yr after I quit smoking"  . Alcohol Use: No  . Drug Use: No  . Sexual Activity: Not Currently   Other Topics Concern  . Not on file   Social History Narrative   Admitted to [facility] 04[facility]13/16   Married   Former smoker-stopped 1986   Alcohol none   DNR   Functional Status Survey:    Allergies  Allergen Reactions  . Losartan Other (See Comments)    Hyperkalemia > 7  . Azithromycin Itching  . Contrast Media [Iodinated Diagnostic Agents] Rash  . Doxycycline Itching       . Keflex [Cephalexin] Itching  . Macrodantin Itching  . Minocycline Hcl Itching  . Nitrofurantoin Itching  . Zithromax [Azithromycin Dihydrate] Itching    Pertinent  Health Maintenance Due  Topic Date Due  . OPHTHALMOLOGY EXAM  12/06/2016 (Originally 08/20/1937)  . URINE  MICROALBUMIN  12/06/2016 (Originally 08/20/1937)  . PNA vac Low Risk Adult (1 of 2 - PCV13) 04/15/2017 (Originally 08/19/1994)  . INFLUENZA VACCINE  07/06/2016  . HEMOGLOBIN A1C  09/12/2016  . FOOT EXAM  04/19/2017    Medications:   Medication List       This list is accurate as of: 05/19/16  8:17 PM.  Always use  your most recent med list.               ampicillin 2 g in sodium chloride 0.9 % 50 mL  Inject 2 g into the vein every 4 (four) hours. Pharmacy to dose ampicillin, Last dose on 05/20/2016     ANORO ELLIPTA 62.5-25 MCG/INH Aepb  Generic drug:  umeclidinium-vilanterol  Inhale 1 puff into the lungs daily.     aspirin EC 81 MG tablet  Take 81 mg by mouth every morning.     carvedilol 3.125 MG tablet  Commonly known as:  COREG  Take 1 tablet (3.125 mg total) by mouth 2 (two) times daily with a meal.     cefTRIAXone 2 g in dextrose 5 % 50 mL  Inject 2 g into the vein every 12 (twelve) hours. End date 05/20/2016     clopidogrel 75 MG tablet  Commonly known as:  PLAVIX  Take 75 mg by mouth daily.     docusate sodium 100 MG capsule  Commonly known as:  COLACE  Take 100 mg by mouth daily.     famotidine 20 MG tablet  Commonly known as:  PEPCID  Take 1 tablet (20 mg total) by mouth at bedtime.     ferrous sulfate 325 (65 FE) MG tablet  Take 325 mg by mouth daily with breakfast.     furosemide 80 MG tablet  Commonly known as:  LASIX  Take 1 tablet (80 mg total) by mouth 2 (two) times daily.     gabapentin 300 MG capsule  Commonly known as:  NEURONTIN  Take 300 mg by mouth 2 (two) times daily.     guaiFENesin 600 MG 12 hr tablet  Commonly known as:  MUCINEX  Take 1 tablet (600 mg total) by mouth 2 (two) times daily.     hydrOXYzine 25 MG tablet  Commonly known as:  ATARAX/VISTARIL  Take 25 mg by mouth every 6 (six) hours as needed for itching.     ipratropium-albuterol 0.5-2.5 (3) MG/3ML Soln  Commonly known as:  DUONEB  Take 3 mLs by nebulization every 6  (six) hours as needed.     isosorbide mononitrate 30 MG 24 hr tablet  Commonly known as:  IMDUR  Take 30 mg by mouth daily.     metFORMIN 500 MG tablet  Commonly known as:  GLUCOPHAGE  Take 500 mg by mouth 2 (two) times daily with a meal.     multivitamin with minerals tablet  Take 1 tablet by mouth daily.     nitroGLYCERIN 0.4 MG SL tablet  Commonly known as:  NITROSTAT  Place 1 tablet (0.4 mg total) under the tongue every 5 (five) minutes as needed for chest pain.     NUTRITIONAL SUPPLEMENT PO  Give 60 cc of Med Pass three times daily     nystatin cream  Commonly known as:  MYCOSTATIN  Apply 1 application topically 2 (two) times daily. Apply to buttock crease for fungal rash.     OXYGEN  Inhale 2 L into the lungs continuous.     phenol 1.4 % Liqd  Commonly known as:  CHLORASEPTIC  Use as directed 2 sprays in the mouth or throat every 2 (two) hours as needed for throat irritation / pain.     potassium chloride SA 20 MEQ tablet  Commonly known as:  K-DUR,KLOR-CON  Take 20 mEq by mouth daily.     pravastatin 40 MG tablet  Commonly known as:  PRAVACHOL  Take 1 tablet (40 mg total) by mouth every evening.     spironolactone 25 MG tablet  Commonly known as:  ALDACTONE  Take 0.5 tablets (12.5 mg total) by mouth daily.     traMADol 50 MG tablet  Commonly known as:  ULTRAM  Take 1 tablet (50 mg total) by mouth every 12 (twelve) hours as needed for moderate pain.     vitamin B-12 500 MCG tablet  Commonly known as:  CYANOCOBALAMIN  Take 1,000 mcg by mouth daily.        Review of Systems  Constitutional: Negative for fever, chills, activity change, appetite change and fatigue.  HENT: Negative for congestion, rhinorrhea, sinus pressure and sore throat.   Eyes: Negative.   Respiratory: Negative for cough, chest tightness, shortness of breath, wheezing and stridor.   Cardiovascular: Positive for leg swelling. Negative for chest pain and palpitations.  Gastrointestinal:  Negative for nausea, vomiting, abdominal pain, diarrhea, constipation and abdominal distention.  Endocrine: Negative.   Genitourinary: Negative for dysuria, urgency, frequency and flank pain.  Musculoskeletal: Positive for gait problem.       Generalized weakness   Skin: Negative for color change, pallor and rash.       Sacral wound managed by wound care Nurse.  Neurological: Negative for dizziness, light-headedness and headaches.  Psychiatric/Behavioral: Negative for hallucinations, confusion, sleep disturbance and agitation. The patient is not nervous/anxious.     Filed Vitals:   05/19/16 1339  BP: 126/62  Pulse: 64  Temp: 97.3 F (36.3 C)  TempSrc: Oral  Resp: 18  Height: 6' (1.829 m)  Weight: 169 lb (76.658 kg)  SpO2: 93%   Body mass index is 22.92 kg/(m^2). Physical Exam  Constitutional: He appears well-developed and well-nourished. No distress.  Elderly   HENT:  Head: Normocephalic.  Mouth/Throat: Oropharynx is clear and moist.  Eyes: Conjunctivae and EOM are normal. Pupils are equal, round, and reactive to light. Right eye exhibits no discharge. Left eye exhibits no discharge. No scleral icterus.  Neck: Normal range of motion. No JVD present. No thyromegaly present.  Cardiovascular: Normal rate, regular rhythm, normal heart sounds and intact distal pulses.  Exam reveals no gallop and no friction rub.   No murmur heard. Pulmonary/Chest: Effort normal and breath sounds normal. No respiratory distress. He has no wheezes. He has no rales.  Oxygen off during visit.   Abdominal: Soft. Bowel sounds are normal. He exhibits no distension and no mass. There is no tenderness. There is no rebound and no guarding.  Musculoskeletal: Normal range of motion. He exhibits no tenderness.  Left foot amputee. Bilateral +3 edema. Self propel on  wheelchair  Lymphadenopathy:    He has no cervical adenopathy.  Neurological: He is alert.  Skin: Skin is warm and dry. No rash noted. No  erythema. No pallor.  Sacral wound managed by wound nurse no signs of infection noted. Right AC PICC line site clean, dry and intact. Antibiotic infusing.   Psychiatric: He has a normal mood and affect.    Labs reviewed: Basic Metabolic Panel:  Recent Labs  04/12/16 0525  04/13/16 0525  04/29/16 04/30/16 05/14/16 1448  NA 138  < > 139  < > 140 140 137  K 4.7  --  4.1  < > 2.8* 3.8 3.5  CL 106  --  105  --   --   --  93*  CO2 18*  --  24  --   --   --  25  GLUCOSE 197*  --  201*  --   --   --  140*  BUN 49*  < > 47*  < > 21 21 30*  CREATININE 1.15  < > 1.13  < > 1.0 1.1 1.26*  CALCIUM 8.8*  --  9.1  --   --   --  8.3*  MG  --   --  1.9  --   --   --   --   < > = values in this interval not displayed. Liver Function Tests:  Recent Labs  04/10/16 0514 04/11/16 0509 04/12/16 0525 04/19/16  AST 47* 45* 36 19  ALT 49 48 40 14  ALKPHOS 172* 160* 133* 113  BILITOT 1.0 0.9 0.8  --   PROT 4.9* 5.1* 4.9*  --   ALBUMIN 3.1* 3.1* 2.9*  --     Recent Labs  03/12/16 2114  LIPASE 25   No results for input(s): AMMONIA in the last 8760 hours. CBC:  Recent Labs  04/07/16 0013 04/07/16 0421  04/10/16 0514  04/12/16 0919 04/12/16 1332 04/19/16 04/29/16  WBC 12.9* 15.4*  < > 7.0  < > 5.2 7.7 6.4 4.2  NEUTROABS 11.0* 13.1*  --   --   --   --   --   --   --   HGB 9.2* 9.4*  < > 9.4*  --  9.9* 10.4* 8.8* 8.7*  HCT 27.7* 28.6*  < > 28.2*  --  29.6* 31.6* 28* 28*  MCV 81.0 82.7  < > 81.7  --  83.1 84.0  --   --   PLT 74* 60*  < > 71*  --  52* 80* 50* 70*  < > = values in this interval not displayed. Cardiac Enzymes:  Recent Labs  04/07/16 1013 04/07/16 1730 04/08/16 0322  TROPONINI 1.27* 1.56* 1.19*   BNP: Invalid input(s): POCBNP CBG:  Recent Labs  04/13/16 0816 04/13/16 1133 04/13/16 1716  GLUCAP 248* 235* 174*    Procedures and Imaging Studies During Stay: Ir Fluoro Guide Cv Line Right  04/27/2016  INDICATION: Bacteremia. Previously placed PICC accidentally  pulled. Request for replacement of PICC for continued intravenous antibiotics. EXAM: RIGHT UPPER EXTREMITY PICC LINE PLACEMENT WITH ULTRASOUND AND FLUOROSCOPIC GUIDANCE MEDICATIONS: 1% Lidocaine. ANESTHESIA/SEDATION: No sedation medication given. Marland Kitchen FLUOROSCOPY TIME:  Fluoroscopy Time: 0 minutes 18 seconds. COMPLICATIONS: None immediate. PROCEDURE: The patient was advised of the possible risks and complications and agreed to undergo the procedure. The patient was then brought to the angiographic suite for the procedure. The right arm was prepped with chlorhexidine, draped in the usual sterile fashion using maximum barrier technique (cap and mask, sterile gown, sterile gloves, large sterile sheet, hand hygiene and cutaneous antisepsis) and infiltrated locally with 1% Lidocaine. Ultrasound demonstrated patency of the right basilic vein, and this was documented with an image. Under real-time ultrasound guidance, this vein was accessed with a 21 gauge micropuncture needle and image documentation was performed. A 0.018 wire was introduced in to the vein. Over this, a 5 [place] single lumen power-injectable PICC was advanced to the lower SVC/right atrial junction. Fluoroscopy during the procedure and fluoro spot radiograph confirms appropriate catheter position. The catheter was flushed and covered with a sterile dressing. Catheter length:  39 cm IMPRESSION: Successful right arm Power PICC line placement with ultrasound and fluoroscopic guidance. The catheter is ready for use. Read by:  Gareth Eagle, PA-C Electronically Signed   By: Markus Daft M.D.   On:  04/27/2016 14:09   Ir US Guide Vasc Access Right  04/27/2016  INDICATION: Bacteremia. Previously placed PICC accidentally pulled. Request for replacement of PICC for continued intravenous antibiotics. EXAM: RIGHT UPPER EXTREMITY PICC LINE PLACEMENT WITH ULTRASOUND AND FLUOROSCOPIC GUIDANCE MEDICATIONS: 1% Lidocaine. ANESTHESIA/SEDATION: No sedation medication given. Marland Kitchen  FLUOROSCOPY TIME:  Fluoroscopy Time: 0 minutes 18 seconds. COMPLICATIONS: None immediate. PROCEDURE: The patient was advised of the possible risks and complications and agreed to undergo the procedure. The patient was then brought to the angiographic suite for the procedure. The right arm was prepped with chlorhexidine, draped in the usual sterile fashion using maximum barrier technique (cap and mask, sterile gown, sterile gloves, large sterile sheet, hand hygiene and cutaneous antisepsis) and infiltrated locally with 1% Lidocaine. Ultrasound demonstrated patency of the right basilic vein, and this was documented with an image. Under real-time ultrasound guidance, this vein was accessed with a 21 gauge micropuncture needle and image documentation was performed. A 0.018 wire was introduced in to the vein. Over this, a 5 [place] single lumen power-injectable PICC was advanced to the lower SVC/right atrial junction. Fluoroscopy during the procedure and fluoro spot radiograph confirms appropriate catheter position. The catheter was flushed and covered with a sterile dressing. Catheter length:  39 cm IMPRESSION: Successful right arm Power PICC line placement with ultrasound and fluoroscopic guidance. The catheter is ready for use. Read by:  Gareth Eagle, PA-C Electronically Signed   By: Markus Daft M.D.   On: 04/27/2016 14:09    Assessment/Plan:   1. Essential hypertension B/p stable. Continue Aldactone 25 mg tablet, imdur 30 mg 24 HR Tablet, coreg 3.125 mg Tablet. BMP in 1-2 weeks with PCP   2. Chronic obstructive pulmonary disease, unspecified COPD type ([place]) Stable. Continue Duonebs and Anoro Ellipta 62.5-25 mcg inhaler. Continue oxygen via nasal cannula PRN.   3. Prosthetic valve endocarditis, subsequent encounter S/p short term rehabilitation post hospitalization from 04/06/2016- 04/13/2016 for SIRS syndrome due to sepsis with septic shock. Patient was ultimately determined have an endocarditis ofaortic  prosthetic valve secondary to TAVR on 11/05/2014  He is currently on I.V antibiotics Cetriaxone 2 Gm Q 12 hrs and Ampicillin for endocarditis until 05/20/2016. Follow up with [facility]  Dr. Burt Knack as directed last seen 05/14/2016 to follow up in 4-6 weeks.   4. Type 2 diabetes mellitus with vascular disease (HCC) Continue on Metofrmin 500 mg Tablet. Hgb A1C with PCP.   5. Atherosclerosis of native coronary artery of native heart without angina pectoris Chest pain free. Continue on Plavix 75 mg Tablet.   6. Chronic systolic (congestive) heart failure (HCC) Has had worsening edema during Rehab. Furosemide to 80 mg twice daily by cardiologist and metolazone d/c on last visit 05/14/2016.continue daily weight.   7. Pressure ulcer unstageable sacral area. Will discharge with [facility] Nurse for wound management.   8. BPH (benign prostatic hyperplasia) No issues reported. Continue to monitor.   9. Hyperlipidemia Continue on pravastatin 40 mg tablet daily . Lipid panel with PCP.   10. Iron deficiency anemia Continue Ferrous sulfate 325 mg Tablet daily. CBC in 1-2 weeks with PCP.   11. Abnormality of gait Has had fall episode twice during course of stay in Rehab due to cognitive impairement. Has worked with PT/OT now stable for discharge home.He will be discharged home with Home health PT/OT to continue with ROM, Exercise, Gait stability and muscle strengthening. He does not require any DME has own walker.He will require [facility] Nurse for sacral wound care.Fall and  safety precautions.   Patient is being discharged with the following home health services:   PT/OT to continue with ROM, Exercise, Gait stability and muscle strengthening.   He will require [facility] Nurse for sacral wound care.   Patient is being discharged with the following durable medical equipment:  None required.   Rx written x 1 month supply for medication not listed as filled per patient 's wife. Note: she brought in a list of medication  that she has already refilled at home.   Patient has been advised to f/u with their PCP in 1-2 weeks to bring them up to date on their rehab stay.  Social services at facility was responsible for arranging this appointment.  Pt was provided with a 30 day supply of prescriptions for medications and refills must be obtained from their PCP.  For controlled substances, a more limited supply may be provided adequate until PCP appointment only.  Future labs/tests needed:  CBC, BMP in 1-2 weeks with PCP

## 2016-05-21 ENCOUNTER — Ambulatory Visit (HOSPITAL_BASED_OUTPATIENT_CLINIC_OR_DEPARTMENT_OTHER): Payer: Medicare Other | Admitting: Hematology and Oncology

## 2016-05-21 ENCOUNTER — Telehealth: Payer: Self-pay | Admitting: Hematology and Oncology

## 2016-05-21 ENCOUNTER — Encounter: Payer: Self-pay | Admitting: Hematology and Oncology

## 2016-05-21 VITALS — BP 113/41 | HR 60 | Temp 98.0°F | Resp 18 | Ht 72.0 in | Wt 159.4 lb

## 2016-05-21 DIAGNOSIS — I5022 Chronic systolic (congestive) heart failure: Secondary | ICD-10-CM

## 2016-05-21 DIAGNOSIS — C8310 Mantle cell lymphoma, unspecified site: Secondary | ICD-10-CM | POA: Diagnosis present

## 2016-05-21 DIAGNOSIS — E440 Moderate protein-calorie malnutrition: Secondary | ICD-10-CM | POA: Insufficient documentation

## 2016-05-21 DIAGNOSIS — D61818 Other pancytopenia: Secondary | ICD-10-CM | POA: Diagnosis not present

## 2016-05-21 DIAGNOSIS — C8318 Mantle cell lymphoma, lymph nodes of multiple sites: Secondary | ICD-10-CM

## 2016-05-21 NOTE — Progress Notes (Signed)
[facility] OFFICE PROGRESS NOTE  Patient Care Team: Leanna Battles, MD as PCP - General (Internal Medicine)  SUMMARY OF ONCOLOGIC HISTORY: I review his records extensively. The patient has recurrent admission to the hospital in the past 2 months. He was just discharged recently for pneumonia. In terms of prior history of mantle cell lymphoma, he was lost to follow-up since 2014. I reviewed his electronic records and collaborated the history with the patient. The patient was diagnosed with mantle cell lymphoma in March 2013 after presentation with pancytopenia and splenomegaly. He underwent bone marrow biopsy dated 03/29/2012 which came back consistent with mantle cell lymphoma. The patient received combination chemotherapy with bendamustine and rituximab. His last dose was given around December 2014 and he subsequently cancelled his appointment and follow-up at [facility] The patient has significant major comorbidities. He has severe coronary artery disease status post bypass surgery and valvular heart disease status post surgical repair. He is on chronic anticoagulation therapy. The patient also has history of osteomyelitis and foot amputation related to that. He had serial imaging study over the years. Prior to current imaging study, CT angiogram of the abdomen and pelvis dated November 2015, showed persistent splenomegaly and abnormal liver appearance consistent with liver cirrhosis. On 03/12/2016, he has CT abdomen and pelvis without contrast which show persistent splenomegaly. On this current admission, on 04/07/2016, he underwent CT scan of the chest, abdomen and pelvis without contrast which is very difficult to read, in my opinion, which showed several abnormal findings including persistent splenomegaly and questionable new liver lesions. The patient complained of chronic weakness since recent hospitalization & poor appetite He denies palpable lymphadenopathy He was  admitted to the hospital from 04/06/2016 to 04/13/2016 and was found to have enterococcal septicemia requiring IV antibiotics. He was discharged to skilled nursing facility  INTERVAL HISTORY: Please see below for problem oriented charting. The patient is still in a skilled nursing facility today, accompanied by his wife He denies chest pain or shortness of breath His appetite is poor and he had progressive weight loss recently He has no recent fevers or chills. He had completed antibiotic therapy yesterday The patient denies any recent signs or symptoms of bleeding such as spontaneous epistaxis, hematuria or hematochezia. He had recurrent falls 4 in the past few weeks since recent hospital discharge  REVIEW OF SYSTEMS:   Constitutional: Denies fevers, chills or abnormal weight loss Eyes: Denies blurriness of vision Ears, nose, mouth, throat, and face: Denies mucositis or sore throat Respiratory: Denies cough, dyspnea or wheezes Cardiovascular: Denies palpitation, chest discomfort or lower extremity swelling Gastrointestinal:  Denies nausea, heartburn or change in bowel habits Skin: Denies abnormal skin rashes Lymphatics: Denies new lymphadenopathy  Neurological:Denies numbness, tingling or new weaknesses Behavioral/Psych: Mood is stable, no new changes  All other systems were reviewed with the patient and are negative.  I have reviewed the past medical history, past surgical history, social history and family history with the patient and they are unchanged from previous note.  ALLERGIES:  is allergic to losartan; azithromycin; contrast media; doxycycline; keflex; macrodantin; minocycline hcl; nitrofurantoin; and zithromax.  MEDICATIONS:  Current Outpatient Prescriptions  Medication Sig Dispense Refill  . ampicillin 2 g in sodium chloride 0.9 % 50 mL Inject 2 g into the vein every 4 (four) hours. Pharmacy to dose ampicillin, Last dose on 05/20/2016 2 g 0  . ANORO ELLIPTA 62.5-25  MCG/INH AEPB Inhale 1 puff into the lungs daily.     Marland Kitchen aspirin EC  81 MG tablet Take 81 mg by mouth every morning.     . carvedilol (COREG) 3.125 MG tablet Take 1 tablet (3.125 mg total) by mouth 2 (two) times daily with a meal. 60 tablet 0  . cefTRIAXone 2 g in dextrose 5 % 50 mL Inject 2 g into the vein every 12 (twelve) hours. End date 05/20/2016 2 g 0  . clopidogrel (PLAVIX) 75 MG tablet Take 75 mg by mouth daily.     Marland Kitchen docusate sodium (COLACE) 100 MG capsule Take 100 mg by mouth daily.    . famotidine (PEPCID) 20 MG tablet Take 1 tablet (20 mg total) by mouth at bedtime. 30 tablet 0  . ferrous sulfate 325 (65 FE) MG tablet Take 325 mg by mouth daily with breakfast.    . furosemide (LASIX) 80 MG tablet Take 1 tablet (80 mg total) by mouth 2 (two) times daily.    Marland Kitchen gabapentin (NEURONTIN) 300 MG capsule Take 300 mg by mouth 2 (two) times daily.     Marland Kitchen guaiFENesin (MUCINEX) 600 MG 12 hr tablet Take 1 tablet (600 mg total) by mouth 2 (two) times daily. 30 tablet 0  . hydrOXYzine (ATARAX/VISTARIL) 25 MG tablet Take 25 mg by mouth every 6 (six) hours as needed for itching.    Marland Kitchen ipratropium-albuterol (DUONEB) 0.5-2.5 (3) MG/3ML SOLN Take 3 mLs by nebulization every 6 (six) hours as needed.     . isosorbide mononitrate (IMDUR) 30 MG 24 hr tablet Take 30 mg by mouth daily.     . metFORMIN (GLUCOPHAGE) 500 MG tablet Take 500 mg by mouth 2 (two) times daily with a meal.     . Multiple Vitamins-Minerals (MULTIVITAMIN WITH MINERALS) tablet Take 1 tablet by mouth daily.    . nitroGLYCERIN (NITROSTAT) 0.4 MG SL tablet Place 1 tablet (0.4 mg total) under the tongue every 5 (five) minutes as needed for chest pain. 25 tablet 1  . Nutritional Supplements (NUTRITIONAL SUPPLEMENT PO) Give 60 cc of Med Pass three times daily    . nystatin cream (MYCOSTATIN) Apply 1 application topically 2 (two) times daily. Apply to buttock crease for fungal rash.    . OXYGEN Inhale 2 L into the lungs continuous.    . phenol  (CHLORASEPTIC) 1.4 % LIQD Use as directed 2 sprays in the mouth or throat every 2 (two) hours as needed for throat irritation / pain.    . potassium chloride SA (K-DUR,KLOR-CON) 20 MEQ tablet Take 20 mEq by mouth daily.    . pravastatin (PRAVACHOL) 40 MG tablet Take 1 tablet (40 mg total) by mouth every evening. 90 tablet 3  . spironolactone (ALDACTONE) 25 MG tablet Take 0.5 tablets (12.5 mg total) by mouth daily. 30 tablet 0  . traMADol (ULTRAM) 50 MG tablet Take 1 tablet (50 mg total) by mouth every 12 (twelve) hours as needed for moderate pain. 10 tablet 0  . vitamin B-12 (CYANOCOBALAMIN) 500 MCG tablet Take 1,000 mcg by mouth daily.      No current facility-administered medications for this visit.    PHYSICAL EXAMINATION: ECOG PERFORMANCE STATUS: 3 - Symptomatic, >50% confined to bed  Filed Vitals:   05/21/16 1255  BP: 113/41  Pulse: 60  Temp: 98 F (36.7 C)  Resp: 18   Filed Weights   05/21/16 1255  Weight: 159 lb 6.4 oz (72.303 kg)    GENERAL:alert, no distress and comfortable. He looks elderly, debilitated, sitting on the wheelchair and cachectic SKIN: He has extensive skin bruises EYES:  normal, Conjunctiva are pale and non-injected, sclera clear OROPHARYNX:no exudate, no erythema and lips, buccal mucosa, and tongue normal  NECK: supple, thyroid normal size, non-tender, without nodularity LYMPH:  no palpable lymphadenopathy in the cervical, axillary or inguinal LUNGS: clear to auscultation and percussion with normal breathing effort HEART: regular rate & rhythm and no murmurs moderate bilateral lower extremity edema ABDOMEN:abdomen soft, non-tender and normal bowel sounds Musculoskeletal:no cyanosis of digits and no clubbing  NEURO: alert & oriented x 3 with fluent speech, no focal motor/sensory deficits  LABORATORY DATA:  I have reviewed the data as listed    Component Value Date/Time   NA 137 05/14/2016 1448   NA 140 04/30/2016   NA 137 11/23/2013 0945   K 3.5  05/14/2016 1448   K 7.2* 11/23/2013 0945   CL 93* 05/14/2016 1448   CL 108* 05/30/2013 0935   CO2 25 05/14/2016 1448   CO2 19* 11/23/2013 0945   GLUCOSE 140* 05/14/2016 1448   GLUCOSE 95 11/23/2013 0945   GLUCOSE 109* 05/30/2013 0935   BUN 30* 05/14/2016 1448   BUN 21 04/30/2016   BUN 37.4* 11/23/2013 0945   CREATININE 1.26* 05/14/2016 1448   CREATININE 1.1 04/30/2016   CREATININE 1.13 04/13/2016 0525   CREATININE 1.6* 11/23/2013 0945   CALCIUM 8.3* 05/14/2016 1448   CALCIUM 10.2 11/23/2013 0945   PROT 4.9* 04/12/2016 0525   PROT 6.3* 11/23/2013 0945   ALBUMIN 2.9* 04/12/2016 0525   ALBUMIN 4.2 11/23/2013 0945   AST 19 04/19/2016   AST 23 11/23/2013 0945   ALT 14 04/19/2016   ALT 19 11/23/2013 0945   ALKPHOS 113 04/19/2016   ALKPHOS 126 11/23/2013 0945   BILITOT 0.8 04/12/2016 0525   BILITOT 0.41 11/23/2013 0945   GFRNONAA 57* 04/13/2016 0525   GFRAA >60 04/13/2016 0525    No results found for: SPEP, UPEP  Lab Results  Component Value Date   WBC 4.2 04/29/2016   NEUTROABS 13.1* 04/07/2016   HGB 8.7* 04/29/2016   HCT 28* 04/29/2016   MCV 84.0 04/12/2016   PLT 70* 04/29/2016      Chemistry      Component Value Date/Time   NA 137 05/14/2016 1448   NA 140 04/30/2016   NA 137 11/23/2013 0945   K 3.5 05/14/2016 1448   K 7.2* 11/23/2013 0945   CL 93* 05/14/2016 1448   CL 108* 05/30/2013 0935   CO2 25 05/14/2016 1448   CO2 19* 11/23/2013 0945   BUN 30* 05/14/2016 1448   BUN 21 04/30/2016   BUN 37.4* 11/23/2013 0945   CREATININE 1.26* 05/14/2016 1448   CREATININE 1.1 04/30/2016   CREATININE 1.13 04/13/2016 0525   CREATININE 1.6* 11/23/2013 0945   GLU 111 04/30/2016      Component Value Date/Time   CALCIUM 8.3* 05/14/2016 1448   CALCIUM 10.2 11/23/2013 0945   ALKPHOS 113 04/19/2016   ALKPHOS 126 11/23/2013 0945   AST 19 04/19/2016   AST 23 11/23/2013 0945   ALT 14 04/19/2016   ALT 19 11/23/2013 0945   BILITOT 0.8 04/12/2016 0525   BILITOT 0.41  11/23/2013 0945       RADIOGRAPHIC STUDIES:I reviewed recent CT scan from May I have personally reviewed the radiological images as listed and agreed with the findings in the report.    ASSESSMENT & PLAN:  Mantle cell lymphoma ([place]) The patient is in very bad shape due to recent hospitalization CT scan from May 2017 showed numerous different abnormalities but nothing  conclusive for diagnosis of lymphoma Overall, the patient is not a candidate for systemic treatment now. I recommend seeing him back in a month for repeat blood work, history and physical examination and defer imaging study to the future and he agreed with the plan of care  Pancytopenia, acquired ([place]) This is multi-factorial, likely due to bone marrow suppression from recent infection versus malnutrition related to side effects from recent antibiotic therapy Lymphoma involvement cannot be excluded He is not symptomatic from pancytopenia Observe only. No transfusion is needed.  Protein-calorie malnutrition, moderate ([facility]) He has significant weight loss, evidence of temporal muscle wasting and cachexia Recommend frequent small meals  Chronic systolic (congestive) heart failure ([place]) He has evidence of heart failure with bilateral lower extremity edema. I would defer to his cardiologist for medical management    Orders Placed This Encounter  Procedures  . CBC with Differential/Platelet    Standing Status: Future     Number of Occurrences:      Standing Expiration Date: 06/25/2017  . Comprehensive metabolic panel    Standing Status: Future     Number of Occurrences:      Standing Expiration Date: 06/25/2017   All questions were answered. The patient knows to call the clinic with any problems, questions or concerns. No barriers to learning was detected. I spent 25 minutes counseling the patient face to face. The total time spent in the appointment was 30 minutes and more than 50% was on counseling and review of test  results     [facility], [place], MD 05/21/2016 1:55 PM

## 2016-05-21 NOTE — Assessment & Plan Note (Signed)
He has evidence of heart failure with bilateral lower extremity edema. I would defer to his cardiologist for medical management

## 2016-05-21 NOTE — Assessment & Plan Note (Signed)
The patient is in very bad shape due to recent hospitalization CT scan from May 2017 showed numerous different abnormalities but nothing conclusive for diagnosis of lymphoma Overall, the patient is not a candidate for systemic treatment now. I recommend seeing him back in a month for repeat blood work, history and physical examination and defer imaging study to the future and he agreed with the plan of care

## 2016-05-21 NOTE — Assessment & Plan Note (Signed)
This is multi-factorial, likely due to bone marrow suppression from recent infection versus malnutrition related to side effects from recent antibiotic therapy Lymphoma involvement cannot be excluded He is not symptomatic from pancytopenia Observe only. No transfusion is needed.

## 2016-05-21 NOTE — Telephone Encounter (Signed)
Gave pt cal & avs °

## 2016-05-21 NOTE — Assessment & Plan Note (Signed)
He has significant weight loss, evidence of temporal muscle wasting and cachexia Recommend frequent small meals

## 2016-05-24 ENCOUNTER — Other Ambulatory Visit: Payer: Self-pay | Admitting: Cardiovascular Disease

## 2016-05-26 ENCOUNTER — Other Ambulatory Visit: Payer: Self-pay

## 2016-05-26 ENCOUNTER — Observation Stay (HOSPITAL_COMMUNITY)
Admission: EM | Admit: 2016-05-26 | Discharge: 2016-05-28 | Disposition: A | Discharge status: Skilled Nursing Facility | Payer: Medicare Other | Attending: Internal Medicine | Admitting: Internal Medicine

## 2016-05-26 ENCOUNTER — Encounter (HOSPITAL_COMMUNITY): Payer: Self-pay

## 2016-05-26 ENCOUNTER — Emergency Department (HOSPITAL_COMMUNITY): Payer: Medicare Other

## 2016-05-26 DIAGNOSIS — I5042 Chronic combined systolic (congestive) and diastolic (congestive) heart failure: Secondary | ICD-10-CM | POA: Insufficient documentation

## 2016-05-26 DIAGNOSIS — I5023 Acute on chronic systolic (congestive) heart failure: Secondary | ICD-10-CM

## 2016-05-26 DIAGNOSIS — Z86718 Personal history of other venous thrombosis and embolism: Secondary | ICD-10-CM | POA: Diagnosis not present

## 2016-05-26 DIAGNOSIS — D61818 Other pancytopenia: Secondary | ICD-10-CM | POA: Diagnosis not present

## 2016-05-26 DIAGNOSIS — Z9181 History of falling: Secondary | ICD-10-CM | POA: Insufficient documentation

## 2016-05-26 DIAGNOSIS — I5022 Chronic systolic (congestive) heart failure: Secondary | ICD-10-CM | POA: Diagnosis not present

## 2016-05-26 DIAGNOSIS — I959 Hypotension, unspecified: Principal | ICD-10-CM | POA: Insufficient documentation

## 2016-05-26 DIAGNOSIS — Z6820 Body mass index (BMI) 20.0-20.9, adult: Secondary | ICD-10-CM | POA: Insufficient documentation

## 2016-05-26 DIAGNOSIS — N183 Chronic kidney disease, stage 3 (moderate): Secondary | ICD-10-CM | POA: Diagnosis not present

## 2016-05-26 DIAGNOSIS — R748 Abnormal levels of other serum enzymes: Secondary | ICD-10-CM | POA: Insufficient documentation

## 2016-05-26 DIAGNOSIS — Z951 Presence of aortocoronary bypass graft: Secondary | ICD-10-CM | POA: Diagnosis not present

## 2016-05-26 DIAGNOSIS — Z952 Presence of prosthetic heart valve: Secondary | ICD-10-CM | POA: Insufficient documentation

## 2016-05-26 DIAGNOSIS — I480 Paroxysmal atrial fibrillation: Secondary | ICD-10-CM | POA: Diagnosis present

## 2016-05-26 DIAGNOSIS — N400 Enlarged prostate without lower urinary tract symptoms: Secondary | ICD-10-CM | POA: Diagnosis not present

## 2016-05-26 DIAGNOSIS — Z89422 Acquired absence of other left toe(s): Secondary | ICD-10-CM | POA: Diagnosis not present

## 2016-05-26 DIAGNOSIS — I130 Hypertensive heart and chronic kidney disease with heart failure and stage 1 through stage 4 chronic kidney disease, or unspecified chronic kidney disease: Secondary | ICD-10-CM | POA: Insufficient documentation

## 2016-05-26 DIAGNOSIS — Z7902 Long term (current) use of antithrombotics/antiplatelets: Secondary | ICD-10-CM | POA: Diagnosis not present

## 2016-05-26 DIAGNOSIS — E1151 Type 2 diabetes mellitus with diabetic peripheral angiopathy without gangrene: Secondary | ICD-10-CM | POA: Insufficient documentation

## 2016-05-26 DIAGNOSIS — E43 Unspecified severe protein-calorie malnutrition: Secondary | ICD-10-CM | POA: Diagnosis not present

## 2016-05-26 DIAGNOSIS — C8313 Mantle cell lymphoma, intra-abdominal lymph nodes: Secondary | ICD-10-CM | POA: Diagnosis not present

## 2016-05-26 DIAGNOSIS — Z66 Do not resuscitate: Secondary | ICD-10-CM | POA: Insufficient documentation

## 2016-05-26 DIAGNOSIS — E860 Dehydration: Secondary | ICD-10-CM | POA: Diagnosis not present

## 2016-05-26 DIAGNOSIS — Z8701 Personal history of pneumonia (recurrent): Secondary | ICD-10-CM | POA: Diagnosis not present

## 2016-05-26 DIAGNOSIS — R5381 Other malaise: Secondary | ICD-10-CM | POA: Diagnosis present

## 2016-05-26 DIAGNOSIS — C8310 Mantle cell lymphoma, unspecified site: Secondary | ICD-10-CM | POA: Diagnosis present

## 2016-05-26 DIAGNOSIS — L89152 Pressure ulcer of sacral region, stage 2: Secondary | ICD-10-CM | POA: Diagnosis not present

## 2016-05-26 DIAGNOSIS — E785 Hyperlipidemia, unspecified: Secondary | ICD-10-CM | POA: Insufficient documentation

## 2016-05-26 DIAGNOSIS — Z79899 Other long term (current) drug therapy: Secondary | ICD-10-CM | POA: Diagnosis not present

## 2016-05-26 DIAGNOSIS — E1122 Type 2 diabetes mellitus with diabetic chronic kidney disease: Secondary | ICD-10-CM | POA: Insufficient documentation

## 2016-05-26 DIAGNOSIS — K219 Gastro-esophageal reflux disease without esophagitis: Secondary | ICD-10-CM | POA: Insufficient documentation

## 2016-05-26 DIAGNOSIS — J449 Chronic obstructive pulmonary disease, unspecified: Secondary | ICD-10-CM | POA: Diagnosis not present

## 2016-05-26 DIAGNOSIS — I6521 Occlusion and stenosis of right carotid artery: Secondary | ICD-10-CM | POA: Insufficient documentation

## 2016-05-26 DIAGNOSIS — E1159 Type 2 diabetes mellitus with other circulatory complications: Secondary | ICD-10-CM | POA: Diagnosis present

## 2016-05-26 DIAGNOSIS — D696 Thrombocytopenia, unspecified: Secondary | ICD-10-CM | POA: Insufficient documentation

## 2016-05-26 DIAGNOSIS — Z89412 Acquired absence of left great toe: Secondary | ICD-10-CM | POA: Diagnosis not present

## 2016-05-26 DIAGNOSIS — Z87891 Personal history of nicotine dependence: Secondary | ICD-10-CM | POA: Insufficient documentation

## 2016-05-26 DIAGNOSIS — Z7984 Long term (current) use of oral hypoglycemic drugs: Secondary | ICD-10-CM | POA: Diagnosis not present

## 2016-05-26 DIAGNOSIS — R531 Weakness: Secondary | ICD-10-CM | POA: Insufficient documentation

## 2016-05-26 DIAGNOSIS — D649 Anemia, unspecified: Secondary | ICD-10-CM | POA: Diagnosis not present

## 2016-05-26 DIAGNOSIS — N179 Acute kidney failure, unspecified: Secondary | ICD-10-CM | POA: Diagnosis not present

## 2016-05-26 DIAGNOSIS — Z955 Presence of coronary angioplasty implant and graft: Secondary | ICD-10-CM | POA: Insufficient documentation

## 2016-05-26 DIAGNOSIS — I2510 Atherosclerotic heart disease of native coronary artery without angina pectoris: Secondary | ICD-10-CM | POA: Diagnosis not present

## 2016-05-26 LAB — CBG MONITORING, ED: Glucose-Capillary: 168 mg/dL — ABNORMAL HIGH (ref 65–99)

## 2016-05-26 LAB — CBC
HEMATOCRIT: 31.6 % — AB (ref 39.0–52.0)
HEMOGLOBIN: 10.4 g/dL — AB (ref 13.0–17.0)
MCH: 28.9 pg (ref 26.0–34.0)
MCHC: 32.9 g/dL (ref 30.0–36.0)
MCV: 87.8 fL (ref 78.0–100.0)
Platelets: 66 10*3/uL — ABNORMAL LOW (ref 150–400)
RBC: 3.6 MIL/uL — ABNORMAL LOW (ref 4.22–5.81)
RDW: 21.6 % — AB (ref 11.5–15.5)
WBC: 8.2 10*3/uL (ref 4.0–10.5)

## 2016-05-26 LAB — URINALYSIS, ROUTINE W REFLEX MICROSCOPIC
Bilirubin Urine: NEGATIVE
GLUCOSE, UA: NEGATIVE mg/dL
Hgb urine dipstick: NEGATIVE
KETONES UR: NEGATIVE mg/dL
LEUKOCYTES UA: NEGATIVE
NITRITE: NEGATIVE
PH: 7 (ref 5.0–8.0)
Protein, ur: NEGATIVE mg/dL
SPECIFIC GRAVITY, URINE: 1.009 (ref 1.005–1.030)

## 2016-05-26 LAB — COMPREHENSIVE METABOLIC PANEL
ALBUMIN: 3.7 g/dL (ref 3.5–5.0)
ALK PHOS: 196 U/L — AB (ref 38–126)
ALT: 25 U/L (ref 17–63)
ANION GAP: 11 (ref 5–15)
AST: 34 U/L (ref 15–41)
BILIRUBIN TOTAL: 0.8 mg/dL (ref 0.3–1.2)
BUN: 62 mg/dL — AB (ref 6–20)
CALCIUM: 9.8 mg/dL (ref 8.9–10.3)
CO2: 30 mmol/L (ref 22–32)
CREATININE: 1.46 mg/dL — AB (ref 0.61–1.24)
Chloride: 96 mmol/L — ABNORMAL LOW (ref 101–111)
GFR calc Af Amer: 48 mL/min — ABNORMAL LOW (ref >60)
GFR calc non Af Amer: 42 mL/min — ABNORMAL LOW (ref >60)
GLUCOSE: 157 mg/dL — AB (ref 65–99)
Potassium: 4.4 mmol/L (ref 3.5–5.1)
SODIUM: 137 mmol/L (ref 135–145)
TOTAL PROTEIN: 6.3 g/dL — AB (ref 6.5–8.1)

## 2016-05-26 LAB — TROPONIN I

## 2016-05-26 MED ORDER — SPIRONOLACTONE 25 MG PO TABS
12.5000 mg | ORAL_TABLET | Freq: Every day | ORAL | Status: DC
Start: 2016-05-27 — End: 2016-05-28
  Administered 2016-05-27 – 2016-05-28 (×2): 12.5 mg via ORAL
  Filled 2016-05-26 (×2): qty 1

## 2016-05-26 MED ORDER — FERROUS SULFATE 325 (65 FE) MG PO TABS
325.0000 mg | ORAL_TABLET | Freq: Every day | ORAL | Status: DC
  Administered 2016-05-27 – 2016-05-28 (×2): 325 mg via ORAL
  Filled 2016-05-26 (×2): qty 1

## 2016-05-26 MED ORDER — PRAVASTATIN SODIUM 40 MG PO TABS
40.0000 mg | ORAL_TABLET | Freq: Every evening | ORAL | Status: DC
  Administered 2016-05-27 (×2): 40 mg via ORAL
  Filled 2016-05-26 (×2): qty 1

## 2016-05-26 MED ORDER — SODIUM CHLORIDE 0.9 % IV BOLUS (SEPSIS)
500.0000 mL | Freq: Once | INTRAVENOUS | Status: AC
  Administered 2016-05-26: 500 mL via INTRAVENOUS

## 2016-05-26 MED ORDER — HYDROXYZINE HCL 25 MG PO TABS: 25.0000 mg | ORAL_TABLET | Freq: Four times a day (QID) | ORAL | Status: DC | PRN

## 2016-05-26 MED ORDER — TRAMADOL HCL 50 MG PO TABS
50.0000 mg | ORAL_TABLET | Freq: Two times a day (BID) | ORAL | Status: DC | PRN
  Administered 2016-05-27: 50 mg via ORAL
  Filled 2016-05-26: qty 1

## 2016-05-26 MED ORDER — VITAMIN B-12 1000 MCG PO TABS
1000.0000 ug | ORAL_TABLET | Freq: Every day | ORAL | Status: DC
  Administered 2016-05-27 – 2016-05-28 (×2): 1000 ug via ORAL
  Filled 2016-05-26 (×2): qty 1

## 2016-05-26 MED ORDER — GABAPENTIN 300 MG PO CAPS
300.0000 mg | ORAL_CAPSULE | Freq: Two times a day (BID) | ORAL | Status: DC
  Administered 2016-05-27 – 2016-05-28 (×4): 300 mg via ORAL
  Filled 2016-05-26 (×4): qty 1

## 2016-05-26 MED ORDER — GUAIFENESIN ER 600 MG PO TB12: 600.0000 mg | ORAL_TABLET | Freq: Two times a day (BID) | ORAL | Status: DC | PRN

## 2016-05-26 MED ORDER — DOCUSATE SODIUM 100 MG PO CAPS: 100.0000 mg | ORAL_CAPSULE | Freq: Every day | ORAL | Status: DC | PRN

## 2016-05-26 MED ORDER — SODIUM CHLORIDE 0.9 % IV SOLN
INTRAVENOUS | Status: DC
  Administered 2016-05-27: via INTRAVENOUS

## 2016-05-26 MED ORDER — PHENOL 1.4 % MT LIQD
2.0000 | OROMUCOSAL | Status: DC | PRN
  Filled 2016-05-26: qty 177

## 2016-05-26 MED ORDER — CARVEDILOL 3.125 MG PO TABS
3.1250 mg | ORAL_TABLET | Freq: Two times a day (BID) | ORAL | Status: DC
  Administered 2016-05-27 – 2016-05-28 (×3): 3.125 mg via ORAL
  Filled 2016-05-26 (×3): qty 1

## 2016-05-26 MED ORDER — UMECLIDINIUM-VILANTEROL 62.5-25 MCG/INH IN AEPB
1.0000 | INHALATION_SPRAY | Freq: Every day | RESPIRATORY_TRACT | Status: DC
  Administered 2016-05-27 – 2016-05-28 (×2): 1 via RESPIRATORY_TRACT
  Filled 2016-05-26: qty 14

## 2016-05-26 MED ORDER — ENSURE ENLIVE PO LIQD
237.0000 mL | Freq: Two times a day (BID) | ORAL | Status: DC
  Administered 2016-05-27 – 2016-05-28 (×4): 237 mL via ORAL

## 2016-05-26 MED ORDER — ACETAMINOPHEN 650 MG RE SUPP: 650.0000 mg | Freq: Four times a day (QID) | RECTAL | Status: DC | PRN

## 2016-05-26 MED ORDER — FAMOTIDINE 20 MG PO TABS
20.0000 mg | ORAL_TABLET | Freq: Every day | ORAL | Status: DC
  Administered 2016-05-27 (×2): 20 mg via ORAL
  Filled 2016-05-26 (×2): qty 1

## 2016-05-26 MED ORDER — NITROGLYCERIN 0.4 MG SL SUBL: 0.4000 mg | SUBLINGUAL_TABLET | SUBLINGUAL | Status: DC | PRN

## 2016-05-26 MED ORDER — ONDANSETRON HCL 4 MG/2ML IJ SOLN: 4.0000 mg | Freq: Four times a day (QID) | INTRAMUSCULAR | Status: DC | PRN

## 2016-05-26 MED ORDER — ALBUTEROL SULFATE (2.5 MG/3ML) 0.083% IN NEBU: 2.5000 mg | INHALATION_SOLUTION | RESPIRATORY_TRACT | Status: DC | PRN

## 2016-05-26 MED ORDER — ACETAMINOPHEN 325 MG PO TABS
650.0000 mg | ORAL_TABLET | Freq: Four times a day (QID) | ORAL | Status: DC | PRN
  Administered 2016-05-27: 650 mg via ORAL
  Filled 2016-05-26: qty 2

## 2016-05-26 MED ORDER — ASPIRIN EC 81 MG PO TBEC
81.0000 mg | DELAYED_RELEASE_TABLET | Freq: Every day | ORAL | Status: DC
  Administered 2016-05-27 – 2016-05-28 (×2): 81 mg via ORAL
  Filled 2016-05-26 (×2): qty 1

## 2016-05-26 MED ORDER — POLYETHYLENE GLYCOL 3350 17 G PO PACK: 17.0000 g | PACK | Freq: Every day | ORAL | Status: DC | PRN

## 2016-05-26 MED ORDER — CLOPIDOGREL BISULFATE 75 MG PO TABS
75.0000 mg | ORAL_TABLET | Freq: Every day | ORAL | Status: DC
  Administered 2016-05-27 – 2016-05-28 (×2): 75 mg via ORAL
  Filled 2016-05-26 (×2): qty 1

## 2016-05-26 MED ORDER — ONDANSETRON HCL 4 MG PO TABS: 4.0000 mg | ORAL_TABLET | Freq: Four times a day (QID) | ORAL | Status: DC | PRN

## 2016-05-26 MED ORDER — INSULIN ASPART 100 UNIT/ML ~~LOC~~ SOLN
0.0000 [IU] | Freq: Three times a day (TID) | SUBCUTANEOUS | Status: DC
  Administered 2016-05-27: 1 [IU] via SUBCUTANEOUS
  Administered 2016-05-27: 2 [IU] via SUBCUTANEOUS
  Administered 2016-05-28: 1 [IU] via SUBCUTANEOUS
  Administered 2016-05-28: 2 [IU] via SUBCUTANEOUS

## 2016-05-26 MED ORDER — ADULT MULTIVITAMIN W/MINERALS CH
1.0000 | ORAL_TABLET | Freq: Every day | ORAL | Status: DC
  Administered 2016-05-27 – 2016-05-28 (×2): 1 via ORAL
  Filled 2016-05-26 (×2): qty 1

## 2016-05-26 MED ORDER — ENSURE ENLIVE PO LIQD: 237.0000 mL | Freq: Three times a day (TID) | ORAL | Status: DC | PRN

## 2016-05-26 NOTE — ED Notes (Signed)
Mepilex was placed on sacrum at the request of the MD.

## 2016-05-26 NOTE — H&P (Addendum)
History and Physical    [name] [id number] DOB: [birth year] DOA: 05/26/2016  Referring MD/NP/PA: Dr. Dayna Barker PCP: Donnajean Lopes, MD  Patient coming from: Home  Chief Complaint: Low blood pressure  HPI: [name] is a 82 y.o. male with medical history significant of CAD s/p CABG, PAF, aortic stenosis s/p TAVR, HTN, HLD, BPH, Mantle cell lymphoma, anemia, and thrombocytopenia; who presents after having low blood pressures at home. Patient had just been home with his wife for the last 4 days. He was admitted into the hospital from 5/2 - 5/9 and then went to [facility] where he stayed 6 weeks after being found to have pneumonia with enterococcal septicemia requiring IV antibiotics through PICC line. He had been doing well per family and had just seen his oncologist Dr. Alvy Bimler 5 days ago for his mantle cell lymphoma which appeared to have spread,  but he was noted not to be in shape for any treatment at this time. Over the last few weeks family notes that he's had multiple falls, generalized weakness, and his appetite has been poor. They state he's been drinking adequate amounts of fluid, but he strains for very long time before he is able to urinate. He denies any fever, chills, shortness of breath, or chest pain. He previously had been having some lower leg swelling which they have been wrapping his legs and family notes that that has improved. However today the patient was more weak than usual and was not able to ambulate well. Systolic blood pressures while patient was lying in his recliner at home were noted to be in the 70s. EMS was called and brought him to the hospital.  ED Course: Upon arrival to the emergency department patient was evaluated and seen to be afebrile, blood pressures as low as 79/43 that improved with IV fluids, and all other vitals are seen to be within normal limits. Lab work revealed WBC 8.2, hemoglobin 10.4, platelets 66, sodium 137, potassium 4.4, chloride  696, CO2 30, BUN 62, creatinine 1.46, and glucose 157. Chest x-ray revealed no acute abnormalities and urinalysis appeared clear of infection. Patient was given these 1 L IV fluids: ED. TRH consulted to admit   Review of Systems: As per HPI otherwise 10 point review of systems negative.   Past Medical History  Diagnosis Date  . CAD (coronary artery disease)     a. s/p CABG 1995;  b. NSTEMI & subsequent BMS to SVG->right PDA 02/03/12.;  c. Cath 02/14/2012 3vd with 4/4 patent grafts and patent stent in vg->pda;  d. 10/2014 PCI/DES to the VG->PDA.  . Carotid stenosis     a. Carotid dopplers [id number]: RICA [id number], LICA [id number];  b. dopplers 3/14:  [id number] RICA, [id number] LICA  . HTN (hypertension)   . HLD (hyperlipidemia)   . Asthmatic bronchitis   . BPH (benign prostatic hypertrophy)   . Herpes zoster   . Anemia   . Thrombocytopenia ([place])     a. secondary to splenomegaly related to lymphoma with suspicion of bone marrow involvement. (Plavix had to be stopped due to this)  . Aortic stenosis, severe     a. ECHO 07/22/14 Severe AS. Peak and mean gradients of 57 mmHg and 42 mmHg, respectively. Calculated AVA is 0.8-0.9 cm2. Trivial AR. Valve area (VTI): 0.84 cm^2. Valve area (Vmax): 0.86 cm^2.. Aortic root dimension: 42 mm (ED) and aortic root mildly dilated;  b. 10/2014 S/P TAVR w/ Oletta Lamas Sapien 3 THV 75mm;  c. 10/2014 Echo: EF 50%,  nl fxning AoV, mean grad of 9, peak of 16.  Marland Kitchen Splenomegaly   . COPD (chronic obstructive pulmonary disease) ([place])   . Peripheral vascular disease ([place])     a. s/p L ray amp 10/13;  b. s/p L 2nd toe amp 12/13  . Pulmonary nodule     a. 74mm RUL calcified pulm nodule noted on CT staging for lymphoma - stable 10/2012.  . Leg DVT (deep venous thromboembolism), chronic ([place])     a. LE dopplers 5/13, 10/13 and 1/14: chronic DVT involving right mid femoral vein, left mid femoral vein, and left popliteal vein.  . Osteomyelitis ([place])     a. L first foot ray amputation 09/2012  . Heart  murmur   . Anginal pain ([place])   . Myocardial infarction [facility]) ?1995  . Pneumonia ?2014  . DM2 (diabetes mellitus, type 2) ([place])   . DJD (degenerative joint disease)   . 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 Stage III s/p bendamustine, Rituxan therapy  . 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 29 mm Edwards Sapien 3 transcatheter heart valve placed via open right transfemoral approach    Past Surgical History  Procedure Laterality Date  . Coronary artery bypass graft  1995  . Shoulder arthroscopy w/ rotator cuff repair Right   . Carpal tunnel release Bilateral   . Incisional hernia repair    . Kidney surgery      "cut me 1/2 in 2 for stones"  . Back surgery    . I&d extremity  09/16/2012    Procedure: IRRIGATION AND DEBRIDEMENT EXTREMITY;  Surgeon: Wylene Simmer, MD;  Location: [facility];  Service: Orthopedics;  Laterality: Left;  irrigation and debridement and first Ray amputation of left foot  . Amputation  09/16/2012    Procedure: AMPUTATION RAY;  Surgeon: Wylene Simmer, MD;  Location: [facility];  Service: Orthopedics;  Laterality: Left;  first Ray amputation left foot  . Amputation  11/21/2012    Procedure: AMPUTATION RAY;  Surgeon: Wylene Simmer, MD;  Location: [place];  Service: Orthopedics;  Laterality: Left;  LEFT SECOND TOE AMPUTATION  . I&d extremity Left 03/01/2013    Procedure: IRRIGATION AND DEBRIDEMENT EXTREMITY;  Surgeon: Wylene Simmer, MD;  Location: [facility];  Service: Orthopedics;  Laterality: Left;  IRRIGATION  AND  DEBRIDEMENT  LEFT  FOOT  . Amputation Left 12/20/2013    Procedure: AMPUTATION LEFT 5TH TRANSMETATARSAL;  Surgeon: Wylene Simmer, MD;  Location: [place];  Service: Orthopedics;  Laterality: Left;  . Achilles tendon lengthening Left 12/20/2013    Procedure: ACHILLES TENDON LENGTHENING;  Surgeon: Wylene Simmer, MD;  Location: [place];  Service: Orthopedics;  Laterality: Left;  . Hernia repair    . Coronary angioplasty with stent  placement  09/2014; 10/14/2014    "?2; 1"  . Cataract extraction w/ intraocular lens  implant, bilateral Bilateral   . Transcatheter aortic valve replacement, transfemoral N/A 11/05/2014    Procedure: TRANSCATHETER AORTIC VALVE REPLACEMENT, TRANSFEMORAL;  Surgeon: [name], MD;  Location: [facility] OR; Berniece Pap 3 THV (size 29 mm, model # [id number], serial # [id number])   . Intraoperative transesophageal echocardiogram N/A 11/05/2014    Procedure: INTRAOPERATIVE TRANSESOPHAGEAL ECHOCARDIOGRAM;  Surgeon: [name], MD;  Location: [facility] OR;  Service: Open Heart Surgery;  Laterality: N/A;  . Left and right heart catheterization with coronary/graft angiogram N/A 02/03/2012    Procedure: LEFT AND RIGHT HEART CATHETERIZATION WITH CORONARY/GRAFT ANGIOGRAM;  Surgeon: [name], MD;  Location: [facility] CATH LAB;  Service: Cardiovascular;  Laterality: N/A;  . Percutaneous stent intervention  02/03/2012    Procedure: PERCUTANEOUS STENT INTERVENTION;  Surgeon: [name], MD;  Location: [facility] CATH LAB;  Service: Cardiovascular;;  . Left heart catheterization with coronary/graft angiogram N/A 02/14/2012    Procedure: LEFT HEART CATHETERIZATION WITH Beatrix Fetters;  Surgeon: Peter M Martinique, MD;  Location: [facility] CATH LAB;  Service: Cardiovascular;  Laterality: N/A;  . Left and right heart catheterization with coronary/graft angiogram N/A 09/23/2014    Procedure: LEFT AND RIGHT HEART CATHETERIZATION WITH Beatrix Fetters;  Surgeon: Blane Ohara, MD;  Location: [facility] CATH LAB;  Service: Cardiovascular;  Laterality: N/A;  . Percutaneous coronary stent intervention (pci-s)  10/14/2014    Procedure: PERCUTANEOUS CORONARY STENT INTERVENTION (PCI-S);  Surgeon: Blane Ohara, MD;  Location: [facility] CATH LAB;  Service: Cardiovascular;;     reports that he quit smoking about 31 years ago. His smoking use included Cigarettes and Pipe. He has a 30 pack-year smoking history. He has quit using smokeless tobacco. His  smokeless tobacco use included Chew. He reports that he does not drink alcohol or use illicit drugs.  Allergies  Allergen Reactions  . Losartan Other (See Comments)    Hyperkalemia > 7  . Azithromycin Itching  . Contrast Media [Iodinated Diagnostic Agents] Hives, Itching and Rash    Red/yellow  . Doxycycline Itching       . Keflex [Cephalexin] Itching  . Macrodantin Itching  . Minocycline Hcl Itching  . Nitrofurantoin Itching  . Zithromax [Azithromycin Dihydrate] Itching    Family History  Problem Relation Age of Onset  . Coronary artery disease    . Alcohol abuse    . Other Mother   . Other Father     Prior to Admission medications   Medication Sig Start Date End Date Taking? Authorizing Provider  ANORO ELLIPTA 62.5-25 MCG/INH AEPB Inhale 1 puff into the lungs daily.  02/12/16  Yes Historical Provider, MD  aspirin EC 81 MG tablet Take 81 mg by mouth every morning.    Yes Historical Provider, MD  carvedilol (COREG) 3.125 MG tablet Take 1 tablet (3.125 mg total) by mouth 2 (two) times daily with a meal. 04/13/16  Yes Florencia Reasons, MD  clopidogrel (PLAVIX) 75 MG tablet TAKE ONE TABLET BY MOUTH ONCE DAILY 05/25/16  Yes [name], MD  docusate sodium (COLACE) 100 MG capsule Take 100 mg by mouth daily as needed for moderate constipation.    Yes Historical Provider, MD  ENSURE (ENSURE) Take 237 mLs by mouth 3 (three) times daily as needed (for poor appetite).   Yes Historical Provider, MD  famotidine (PEPCID) 20 MG tablet Take 1 tablet (20 mg total) by mouth at bedtime. Patient taking differently: Take 20 mg by mouth daily as needed for heartburn or indigestion.  04/13/16  Yes Florencia Reasons, MD  ferrous sulfate 325 (65 FE) MG tablet Take 325 mg by mouth daily with breakfast.   Yes Historical Provider, MD  furosemide (LASIX) 80 MG tablet Take 1 tablet (80 mg total) by mouth 2 (two) times daily. 05/05/16  Yes [name], MD  gabapentin (NEURONTIN) 300 MG capsule Take 300 mg by mouth 2 (two)  times daily.  03/03/13  Yes Haywood Pao, MD  guaiFENesin (MUCINEX) 600 MG 12 hr tablet Take 1 tablet (600 mg total) by mouth 2 (two) times daily. Patient taking differently: Take 600 mg by mouth 2 (two) times daily as  needed for cough or to loosen phlegm.  04/13/16  Yes Florencia Reasons, MD  hydrOXYzine (ATARAX/VISTARIL) 25 MG tablet Take 25 mg by mouth every 6 (six) hours as needed for itching.   Yes Historical Provider, MD  metFORMIN (GLUCOPHAGE) 500 MG tablet Take 500 mg by mouth 2 (two) times daily with a meal.    Yes Historical Provider, MD  Multiple Vitamins-Minerals (MULTIVITAMIN WITH MINERALS) tablet Take 1 tablet by mouth daily.   Yes Historical Provider, MD  nitroGLYCERIN (NITROSTAT) 0.4 MG SL tablet Place 1 tablet (0.4 mg total) under the tongue every 5 (five) minutes as needed for chest pain. 11/21/15  Yes [name], MD  phenol (CHLORASEPTIC) 1.4 % LIQD Use as directed 2 sprays in the mouth or throat every 2 (two) hours as needed for throat irritation / pain.   Yes Historical Provider, MD  polyethylene glycol (MIRALAX / GLYCOLAX) packet Take 17 g by mouth daily as needed for moderate constipation or severe constipation.   Yes Historical Provider, MD  potassium chloride SA (K-DUR,KLOR-CON) 20 MEQ tablet Take 20 mEq by mouth daily.   Yes Historical Provider, MD  pravastatin (PRAVACHOL) 40 MG tablet Take 1 tablet (40 mg total) by mouth every evening. 05/14/16  Yes [name], MD  promethazine (PHENERGAN) 25 MG tablet Take 25 mg by mouth every 6 (six) hours as needed. n/v 04/06/16  Yes Historical Provider, MD  spironolactone (ALDACTONE) 25 MG tablet Take 0.5 tablets (12.5 mg total) by mouth daily. 04/13/16  Yes Florencia Reasons, MD  traMADol (ULTRAM) 50 MG tablet Take 1 tablet (50 mg total) by mouth every 12 (twelve) hours as needed for moderate pain. 04/13/16  Yes Florencia Reasons, MD  UNKNOWN TO PATIENT Apply 1 application topically daily as needed (bed sore.). "Medication comparable to nystatin cream but cheaper"  for bed sores.   Yes Historical Provider, MD  vitamin B-12 (CYANOCOBALAMIN) 500 MCG tablet Take 1,000 mcg by mouth daily.    Yes Historical Provider, MD  ampicillin 2 g in sodium chloride 0.9 % 50 mL Inject 2 g into the vein every 4 (four) hours. Pharmacy to dose ampicillin, Last dose on 05/20/2016 Patient not taking: Reported on 05/26/2016 04/13/16   Florencia Reasons, MD  cefTRIAXone 2 g in dextrose 5 % 50 mL Inject 2 g into the vein every 12 (twelve) hours. End date 05/20/2016 Patient not taking: Reported on 05/26/2016 04/13/16   Florencia Reasons, MD    Physical Exam:  Constitutional: Frail elderly gentleman NAD, calm, comfortable Filed Vitals:   05/26/16 1932 05/26/16 2000 05/26/16 2030 05/26/16 2115  BP:  123/58 117/80 79/43  Pulse:      Temp: 98.1 F (36.7 C)     TempSrc: Oral     Resp: 20 19 22 18   Height:      Weight:      SpO2:       Eyes: PERRL, lids and conjunctivae normal ENMT: Mucous membranes are moist. Posterior pharynx clear of any exudate or lesions.   Neck: normal, supple, no masses, no thyromegaly Respiratory: clear to auscultation bilaterally, no wheezing, no crackles. Normal respiratory effort. No accessory muscle use.  Cardiovascular: Irregular irregular heart rhythm,  +1 pitting bilateral lower extremity edema. Legs are currently wrapped. 2+ pedal pulses. No carotid bruits.  Abdomen: no tenderness, no masses palpated. No hepatosplenomegaly. Bowel sounds positive.  Musculoskeletal: no clubbing / cyanosis. No joint deformity upper and lower extremities. Good ROM, no contractures. Normal muscle tone.  Skin: Stage 1-2 pressure ulcer of  the left superior gluteal region Neurologic: CN 2-12 grossly intact. Sensation intact, DTR normal. Strength 4/5 in all 4.  Psychiatric: Normal judgment and insight. Alert and oriented x 3. Normal mood.     Labs on Admission: I have personally reviewed following labs and imaging studies  CBC:  Recent Labs Lab 05/26/16 1724  WBC 8.2  HGB 10.4*  HCT  31.6*  MCV 87.8  PLT 66*   Basic Metabolic Panel:  Recent Labs Lab 05/26/16 1724  NA 137  K 4.4  CL 96*  CO2 30  GLUCOSE 157*  BUN 62*  CREATININE 1.46*  CALCIUM 9.8   GFR: Estimated Creatinine Clearance: 37 mL/min (by C-G formula based on Cr of 1.46). Liver Function Tests:  Recent Labs Lab 05/26/16 1724  AST 34  ALT 25  ALKPHOS 196*  BILITOT 0.8  PROT 6.3*  ALBUMIN 3.7   No results for input(s): LIPASE, AMYLASE in the last 168 hours. No results for input(s): AMMONIA in the last 168 hours. Coagulation Profile: No results for input(s): INR, PROTIME in the last 168 hours. Cardiac Enzymes: No results for input(s): CKTOTAL, CKMB, CKMBINDEX, TROPONINI in the last 168 hours. BNP (last 3 results) No results for input(s): PROBNP in the last 8760 hours. HbA1C: No results for input(s): HGBA1C in the last 72 hours. CBG:  Recent Labs Lab 05/26/16 1642  GLUCAP 168*   Lipid Profile: No results for input(s): CHOL, HDL, LDLCALC, TRIG, CHOLHDL, LDLDIRECT in the last 72 hours. Thyroid Function Tests: No results for input(s): TSH, T4TOTAL, FREET4, T3FREE, THYROIDAB in the last 72 hours. Anemia Panel: No results for input(s): VITAMINB12, FOLATE, FERRITIN, TIBC, IRON, RETICCTPCT in the last 72 hours. Urine analysis:    Component Value Date/Time   COLORURINE YELLOW 05/26/2016 [place] 05/26/2016 1608   LABSPEC 1.009 05/26/2016 1608   LABSPEC 1.030 03/16/2012 1236   PHURINE 7.0 05/26/2016 1608   PHURINE 5.0 03/16/2012 1236   GLUCOSEU NEGATIVE 05/26/2016 1608   HGBUR NEGATIVE 05/26/2016 1608   HGBUR Trace 03/16/2012 1236   BILIRUBINUR NEGATIVE 05/26/2016 1608   BILIRUBINUR Negative 03/16/2012 [place] 05/26/2016 1608   KETONESUR Negative 03/16/2012 1236   PROTEINUR NEGATIVE 05/26/2016 1608   PROTEINUR 30 03/16/2012 1236   UROBILINOGEN 1.0 11/19/2014 2003   NITRITE NEGATIVE 05/26/2016 1608   NITRITE Negative 03/16/2012 1236    LEUKOCYTESUR NEGATIVE 05/26/2016 1608   LEUKOCYTESUR Negative 03/16/2012 1236   Sepsis Labs: No results found for this or any previous visit (from the past 240 hour(s)).   Radiological Exams on Admission: Dg Chest 2 View  05/26/2016  CLINICAL DATA:  Generalized weakness.  Decreased blood pressure. EXAM: CHEST  2 VIEW COMPARISON:  04/08/2016 FINDINGS: There changes from aortic valve replacement, stable. There stable changes from CABG surgery. Cardiac silhouette is mildly enlarged. No mediastinal or hilar masses or adenopathy are noted. Clear lungs.  No pleural effusion or pneumothorax. Bony thorax is demineralized but grossly intact. IMPRESSION: No acute cardiopulmonary disease. Electronically Signed   By: Lajean Manes M.D.   On: 05/26/2016 18:24    EKG: Independently reviewed. Atrial fibrillation  Assessment/Plan Hypotension suspected secondary to dehydration: Acute. Patient with systolic blood pressures in the 70s at home and on admission. Found to have elevated BUN to creatinine ratio would also suggest a prerenal source. Patient given at least 1 L of IV fluids in the ED. Family notes adequate intake this could be secondary to over diuresis. - Admit to a telemetry bed -  Continue IV fluids at 75 mL per hour for 7 hours( total of approximately 1- 1/2 L, as tolerated given CHF history.) - Check orthostatic vital signs - Check troponins - Check electrolytes - Case management consult in a.m.  Generalized weakness/debility - Continue Ensure - Physical therapy to eval and treat  Acute kidney injury on chronic kidney disease: Creatinine appears to previously been around 1.  Over the last few times with the patient's been seen in the hospital creatinine has been slowly creeping up. Creatinine elevated at 1.46 with BUN of 62. The elevated BUN to creatinine ratio suggest a prerenal in nature. Question over diuresis versus urinary retention secondary to BPH. - check FeUr - f/u repeat BMP after IV  fluids - Held furosemide. Restart +/- adjust dose as medically needed.  Paroxysmal atrial fibrillation: Chadsvasc score = 6 , but patient has a poor candidate for anticoagulation secondary to history of falls and thrombocytopenia. Rates appear to be relatively controlled at this time. - Continue to monitor  Combined systolic and diastolic congestive heart failure: Last echo in 04/2016 showed EF of 25-30%. - Strict I&Os and daily weights - Continue wrapping legs - Continue spironolactone Coreg as tolerated given systolic blood pressures greater than 90. - Held furosemide and potassium supplementation secondary to acute kidney injury  Mental cell lymphoma: Patient poor candidate for treatment at this time  CAD s/p CABG/aortic stenosis s/p TAVR - Continue Plavix and aspirin  COPD without acute exacerbation - Continue Anoro Ellipta - Albuterol nebs prn SOB  Pressure ulcer of sacrum - bed mattress replacement - Dress wounds with protective barrier  Diabetes mellitus type 2: We'll control patient's last hemoglobin A1c was 6.3 and 03/2016 - Held metformin - CBGs every before meals with sensitive sliding scale insulin  Chronic pancytopenia: Patient with hemoglobin of 10.4 and platelets of 66 on admission. No active signs of bleeding noted. - Continue ferrous sulfate - CBC in a.m.  Question of urinary retention with history of BPH - Check bladder scan Q8 hours 3 we'll need to continue or Place Foley if seen to be obstructed - In and out cath prn for retention of urine greater than 300 mL  Elevated alkaline phosphatase: acute on chronic. Alkaline phosphatase elevated at 196 on admission. - continue to monitor  Hyperlipidemia - continue pravastatin  GERD  - continue Pepcid    DVT  prophylaxis:SCDs  Code Status: DNR  Family Communication: Discussed plan of action with the patient's wife and daughter at bedside  Disposition Plan:  Possible discharge in 1-2 days Consults call:  none Admission status: telemetry observation  Norval Morton MD [facility] Pager [phone]  If 7PM-7AM, please contact night-coverage [email or web address] Password [facility]  05/26/2016, 9:59 PM

## 2016-05-26 NOTE — ED Notes (Signed)
MD at bedside. 

## 2016-05-26 NOTE — ED Notes (Signed)
Patient transported to X-ray 

## 2016-05-26 NOTE — Progress Notes (Addendum)
EDCM spoke to patient at bedside.  Patient lives at home with his wife.  Patient also reports he has 2 daughters who sometimes come and stay with him for the evening.  He confirms they have a sitter who comes, "Only when we need them."  Patient reports he has home health services for RN, PT, OT at home but is unsure of which agency it is.  He reports his wife knows all of the "business."  Patient reports he has fallen twice "in a couple of weeks."  Patient reports he has a walker, bedside commode, shower bench, wheelchair and ? Hospital bed at home, he was not sure.  Patient confirms he was recently discharged home from a SNF.  Patient reports he plans on going home at discharge.  No further EDCM needs at this time.  [facility] consult placed

## 2016-05-26 NOTE — ED Provider Notes (Signed)
CSN: [id number]     Arrival date & time 05/26/16  1524 History   First MD Initiated Contact with Patient 05/26/16 1649     Chief Complaint  Patient presents with  . Weakness     (Consider location/radiation/quality/duration/timing/severity/associated sxs/prior Treatment) Patient is a 82 y.o. male presenting with weakness.  Weakness This is a new problem. The current episode started 3 to 5 hours ago. The problem occurs constantly. The problem has been gradually improving. Pertinent negatives include no chest pain, no headaches and no shortness of breath. Nothing aggravates the symptoms. Nothing relieves the symptoms. He has tried nothing for the symptoms.    Past Medical History  Diagnosis Date  . CAD (coronary artery disease)     a. s/p CABG 1995;  b. NSTEMI & subsequent BMS to SVG->right PDA 02/03/12.;  c. Cath 02/14/2012 3vd with 4/4 patent grafts and patent stent in vg->pda;  d. 10/2014 PCI/DES to the VG->PDA.  . Carotid stenosis     a. Carotid dopplers [id number]: RICA [id number], LICA [id number];  b. dopplers 3/14:  123456 RICA, [id number] LICA  . HTN (hypertension)   . HLD (hyperlipidemia)   . Asthmatic bronchitis   . BPH (benign prostatic hypertrophy)   . Herpes zoster   . Anemia   . Thrombocytopenia ([place])     a. secondary to splenomegaly related to lymphoma with suspicion of bone marrow involvement. (Plavix had to be stopped due to this)  . Aortic stenosis, severe     a. ECHO 07/22/14 Severe AS. Peak and mean gradients of 57 mmHg and 42 mmHg, respectively. Calculated AVA is 0.8-0.9 cm2. Trivial AR. Valve area (VTI): 0.84 cm^2. Valve area (Vmax): 0.86 cm^2.. Aortic root dimension: 42 mm (ED) and aortic root mildly dilated;  b. 10/2014 S/P TAVR w/ Edwards Sapien 3 THV 41mm;  c. 10/2014 Echo: EF 50%, nl fxning AoV, mean grad of 9, peak of 16.  Marland Kitchen Splenomegaly   . COPD (chronic obstructive pulmonary disease) ([place])   . Peripheral vascular disease ([place])     a. s/p L ray amp 10/13;  b. s/p L 2nd toe amp  12/13  . Pulmonary nodule     a. 46mm RUL calcified pulm nodule noted on CT staging for lymphoma - stable 10/2012.  . Leg DVT (deep venous thromboembolism), chronic ([place])     a. LE dopplers 5/13, 10/13 and 1/14: chronic DVT involving right mid femoral vein, left mid femoral vein, and left popliteal vein.  . Osteomyelitis ([place])     a. L first foot ray amputation 09/2012  . Heart murmur   . Anginal pain ([place])   . Myocardial infarction [facility]) ?1995  . Pneumonia ?2014  . DM2 (diabetes mellitus, type 2) ([place])   . DJD (degenerative joint disease)   . 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 Stage III s/p bendamustine, Rituxan therapy  . 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 29 mm Edwards Sapien 3 transcatheter heart valve placed via open right transfemoral approach   Past Surgical History  Procedure Laterality Date  . Coronary artery bypass graft  1995  . Shoulder arthroscopy w/ rotator cuff repair Right   . Carpal tunnel release Bilateral   . Incisional hernia repair    . Kidney surgery      "cut me 1/2 in 2 for stones"  . Back surgery    . I&d extremity  09/16/2012    Procedure: IRRIGATION AND DEBRIDEMENT EXTREMITY;  Surgeon: Wylene Simmer, MD;  Location: [facility];  Service: Orthopedics;  Laterality: Left;  irrigation and debridement and first Ray amputation of left foot  . Amputation  09/16/2012    Procedure: AMPUTATION RAY;  Surgeon: Wylene Simmer, MD;  Location: [facility];  Service: Orthopedics;  Laterality: Left;  first Ray amputation left foot  . Amputation  11/21/2012    Procedure: AMPUTATION RAY;  Surgeon: Wylene Simmer, MD;  Location: [place];  Service: Orthopedics;  Laterality: Left;  LEFT SECOND TOE AMPUTATION  . I&d extremity Left 03/01/2013    Procedure: IRRIGATION AND DEBRIDEMENT EXTREMITY;  Surgeon: Wylene Simmer, MD;  Location: [facility];  Service: Orthopedics;  Laterality: Left;  IRRIGATION  AND  DEBRIDEMENT  LEFT  FOOT  . Amputation Left  12/20/2013    Procedure: AMPUTATION LEFT 5TH TRANSMETATARSAL;  Surgeon: Wylene Simmer, MD;  Location: [place];  Service: Orthopedics;  Laterality: Left;  . Achilles tendon lengthening Left 12/20/2013    Procedure: ACHILLES TENDON LENGTHENING;  Surgeon: Wylene Simmer, MD;  Location: [place];  Service: Orthopedics;  Laterality: Left;  . Hernia repair    . Coronary angioplasty with stent placement  09/2014; 10/14/2014    "?2; 1"  . Cataract extraction w/ intraocular lens  implant, bilateral Bilateral   . Transcatheter aortic valve replacement, transfemoral N/A 11/05/2014    Procedure: TRANSCATHETER AORTIC VALVE REPLACEMENT, TRANSFEMORAL;  Surgeon: [name], MD;  Location: [facility] OR; Berniece Pap 3 THV (size 29 mm, model # F048547, serial # [id number])   . Intraoperative transesophageal echocardiogram N/A 11/05/2014    Procedure: INTRAOPERATIVE TRANSESOPHAGEAL ECHOCARDIOGRAM;  Surgeon: [name], MD;  Location[facility] OR;  Service: Open Heart Surgery;  Laterality: N/A;  . Left and right heart catheterization with coronary/graft angiogram N/A 02/03/2012    Procedure: LEFT AND RIGHT HEART CATHETERIZATION WITH Beatrix Fetters;  Surgeon: [name], MD;  Location: [facility] CATH LAB;  Service: Cardiovascular;  Laterality: N/A;  . Percutaneous stent intervention  02/03/2012    Procedure: PERCUTANEOUS STENT INTERVENTION;  Surgeon: [name], MD;  Location: [facility] CATH LAB;  Service: Cardiovascular;;  . Left heart catheterization with coronary/graft angiogram N/A 02/14/2012    Procedure: LEFT HEART CATHETERIZATION WITH Beatrix Fetters;  Surgeon: Peter M Martinique, MD;  Location: [facility] CATH LAB;  Service: Cardiovascular;  Laterality: N/A;  . Left and right heart catheterization with coronary/graft angiogram N/A 09/23/2014    Procedure: LEFT AND RIGHT HEART CATHETERIZATION WITH Beatrix Fetters;  Surgeon: Blane Ohara, MD;  Location: [facility] CATH LAB;  Service: Cardiovascular;  Laterality: N/A;  .  Percutaneous coronary stent intervention (pci-s)  10/14/2014    Procedure: PERCUTANEOUS CORONARY STENT INTERVENTION (PCI-S);  Surgeon: Blane Ohara, MD;  Location: [facility] CATH LAB;  Service: Cardiovascular;;   Family History  Problem Relation Age of Onset  . Coronary artery disease    . Alcohol abuse    . Other Mother   . Other Father    Social History  Substance Use Topics  . Smoking status: Former Smoker -- 1.00 packs/day for 30 years    Types: Cigarettes, Pipe    Quit date: 03/29/1985  . Smokeless tobacco: Former Systems developer    Types: Chew     Comment: "chewed for ~ 1 yr after I quit smoking"  . Alcohol Use: No    Review of Systems  Constitutional: Negative for chills and fatigue.  Eyes: Negative for pain.  Respiratory: Negative for chest tightness and shortness of breath.   Cardiovascular: Negative for chest pain.  Endocrine:  Negative for polydipsia and polyuria.  Genitourinary: Negative for dysuria.  Neurological: Positive for weakness. Negative for syncope, speech difficulty, numbness and headaches.  All other systems reviewed and are negative.     Allergies  Losartan; Azithromycin; Contrast media; Doxycycline; Keflex; Macrodantin; Minocycline hcl; Nitrofurantoin; and Zithromax  Home Medications   Prior to Admission medications   Medication Sig Start Date End Date Taking? Authorizing Provider  ANORO ELLIPTA 62.5-25 MCG/INH AEPB Inhale 1 puff into the lungs daily.  02/12/16  Yes Historical Provider, MD  aspirin EC 81 MG tablet Take 81 mg by mouth every morning.    Yes Historical Provider, MD  carvedilol (COREG) 3.125 MG tablet Take 1 tablet (3.125 mg total) by mouth 2 (two) times daily with a meal. 04/13/16  Yes Florencia Reasons, MD  clopidogrel (PLAVIX) 75 MG tablet TAKE ONE TABLET BY MOUTH ONCE DAILY 05/25/16  Yes [name], MD  docusate sodium (COLACE) 100 MG capsule Take 100 mg by mouth daily as needed for moderate constipation.    Yes Historical Provider, MD  ENSURE (ENSURE)  Take 237 mLs by mouth 3 (three) times daily as needed (for poor appetite).   Yes Historical Provider, MD  famotidine (PEPCID) 20 MG tablet Take 1 tablet (20 mg total) by mouth at bedtime. Patient taking differently: Take 20 mg by mouth daily as needed for heartburn or indigestion.  04/13/16  Yes Florencia Reasons, MD  ferrous sulfate 325 (65 FE) MG tablet Take 325 mg by mouth daily with breakfast.   Yes Historical Provider, MD  furosemide (LASIX) 80 MG tablet Take 1 tablet (80 mg total) by mouth 2 (two) times daily. 05/05/16  Yes [name], MD  gabapentin (NEURONTIN) 300 MG capsule Take 300 mg by mouth 2 (two) times daily.  03/03/13  Yes Haywood Pao, MD  guaiFENesin (MUCINEX) 600 MG 12 hr tablet Take 1 tablet (600 mg total) by mouth 2 (two) times daily. Patient taking differently: Take 600 mg by mouth 2 (two) times daily as needed for cough or to loosen phlegm.  04/13/16  Yes Florencia Reasons, MD  hydrOXYzine (ATARAX/VISTARIL) 25 MG tablet Take 25 mg by mouth every 6 (six) hours as needed for itching.   Yes Historical Provider, MD  metFORMIN (GLUCOPHAGE) 500 MG tablet Take 500 mg by mouth 2 (two) times daily with a meal.    Yes Historical Provider, MD  Multiple Vitamins-Minerals (MULTIVITAMIN WITH MINERALS) tablet Take 1 tablet by mouth daily.   Yes Historical Provider, MD  nitroGLYCERIN (NITROSTAT) 0.4 MG SL tablet Place 1 tablet (0.4 mg total) under the tongue every 5 (five) minutes as needed for chest pain. 11/21/15  Yes [name], MD  phenol (CHLORASEPTIC) 1.4 % LIQD Use as directed 2 sprays in the mouth or throat every 2 (two) hours as needed for throat irritation / pain.   Yes Historical Provider, MD  polyethylene glycol (MIRALAX / GLYCOLAX) packet Take 17 g by mouth daily as needed for moderate constipation or severe constipation.   Yes Historical Provider, MD  potassium chloride SA (K-DUR,KLOR-CON) 20 MEQ tablet Take 20 mEq by mouth daily.   Yes Historical Provider, MD  pravastatin (PRAVACHOL) 40 MG  tablet Take 1 tablet (40 mg total) by mouth every evening. 05/14/16  Yes [name], MD  promethazine (PHENERGAN) 25 MG tablet Take 25 mg by mouth every 6 (six) hours as needed. n/v 04/06/16  Yes Historical Provider, MD  spironolactone (ALDACTONE) 25 MG tablet Take 0.5 tablets (12.5 mg total) by mouth daily.  04/13/16  Yes Florencia Reasons, MD  traMADol (ULTRAM) 50 MG tablet Take 1 tablet (50 mg total) by mouth every 12 (twelve) hours as needed for moderate pain. 04/13/16  Yes Florencia Reasons, MD  UNKNOWN TO PATIENT Apply 1 application topically daily as needed (bed sore.). "Medication comparable to nystatin cream but cheaper" for bed sores.   Yes Historical Provider, MD  vitamin B-12 (CYANOCOBALAMIN) 500 MCG tablet Take 1,000 mcg by mouth daily.    Yes Historical Provider, MD  ampicillin 2 g in sodium chloride 0.9 % 50 mL Inject 2 g into the vein every 4 (four) hours. Pharmacy to dose ampicillin, Last dose on 05/20/2016 Patient not taking: Reported on 05/26/2016 04/13/16   Florencia Reasons, MD  cefTRIAXone 2 g in dextrose 5 % 50 mL Inject 2 g into the vein every 12 (twelve) hours. End date 05/20/2016 Patient not taking: Reported on 05/26/2016 04/13/16   Florencia Reasons, MD   BP 124/66 mmHg  Pulse 53  Temp(Src) 98.2 F (36.8 C) (Oral)  Resp 24  Ht 6' (1.829 m)  Wt 152 lb 11.2 oz (69.264 kg)  BMI 20.71 kg/m2  SpO2 99% Physical Exam  Constitutional: He is oriented to person, place, and time. He appears well-developed and well-nourished.  HENT:  Head: Normocephalic and atraumatic.  Neck: Normal range of motion.  Cardiovascular: Normal rate.   Pulmonary/Chest: Effort normal and breath sounds normal. No respiratory distress.  Abdominal: Soft. He exhibits no distension. There is no tenderness. There is no rebound.  Musculoskeletal: Normal range of motion. He exhibits no edema or tenderness.  Neurological: He is alert and oriented to person, place, and time.  No altered mental status, able to give full seemingly accurate history.  Face is  symmetric, EOM's intact, pupils equal and reactive, vision intact, tongue and uvula midline without deviation Upper and Lower extremity motor 5/5, intact pain perception in distal extremities, 2+ reflexes in biceps, patella and achilles tendons. Finger to nose normal, heel to shin normal.   Skin: Skin is warm and dry.  Nursing note and vitals reviewed.   ED Course  Procedures (including critical care time)  Angiocath insertion Performed by: Merrily Pew  Consent: Verbal consent obtained. Risks and benefits: risks, benefits and alternatives were discussed Time out: Immediately prior to procedure a "time out" was called to verify the correct patient, procedure, equipment, support staff and site/side marked as required.  Preparation: Patient was prepped and draped in the usual sterile fashion.  Vein Location: left ac  Ultrasound Guided  Gauge: 20  Normal blood return and flush without difficulty Patient tolerance: Patient tolerated the procedure well with no immediate complications.  Angiocath insertion Performed by: Merrily Pew  Consent: Verbal consent obtained. Risks and benefits: risks, benefits and alternatives were discussed Time out: Immediately prior to procedure a "time out" was called to verify the correct patient, procedure, equipment, support staff and site/side marked as required.  Preparation: Patient was prepped and draped in the usual sterile fashion.  Vein Location: right basilic  Ultrasound Guided  Gauge: 20  Normal blood return and flush without difficulty Patient tolerance: Patient tolerated the procedure well with no immediate complications.   Labs Review Labs Reviewed  CBC - Abnormal; Notable for the following:    RBC 3.60 (*)    Hemoglobin 10.4 (*)    HCT 31.6 (*)    RDW 21.6 (*)    Platelets 66 (*)    All other components within normal limits  COMPREHENSIVE METABOLIC PANEL -  Abnormal; Notable for the following:    Chloride 96 (*)     Glucose, Bld 157 (*)    BUN 62 (*)    Creatinine, Ser 1.46 (*)    Total Protein 6.3 (*)    Alkaline Phosphatase 196 (*)    GFR calc non Af Amer 42 (*)    GFR calc Af Amer 48 (*)    All other components within normal limits  CBG MONITORING, ED - Abnormal; Notable for the following:    Glucose-Capillary 168 (*)    All other components within normal limits  URINALYSIS, ROUTINE W REFLEX MICROSCOPIC (NOT AT [facility])  TROPONIN I  BASIC METABOLIC PANEL  CBC  MAGNESIUM  TROPONIN I  TROPONIN I  CREATININE, URINE, RANDOM  UREA NITROGEN, URINE    Imaging Review Dg Chest 2 View  05/26/2016  CLINICAL DATA:  Generalized weakness.  Decreased blood pressure. EXAM: CHEST  2 VIEW COMPARISON:  04/08/2016 FINDINGS: There changes from aortic valve replacement, stable. There stable changes from CABG surgery. Cardiac silhouette is mildly enlarged. No mediastinal or hilar masses or adenopathy are noted. Clear lungs.  No pleural effusion or pneumothorax. Bony thorax is demineralized but grossly intact. IMPRESSION: No acute cardiopulmonary disease. Electronically Signed   By: Lajean Manes M.D.   On: 05/26/2016 18:24   I have personally reviewed and evaluated these images and lab results as part of my medical decision-making.   EKG Interpretation   Date/Time:  Wednesday May 26 2016 15:56:31 EDT Ventricular Rate:  72 PR Interval:    QRS Duration: 135 QT Interval:  438 QTC Calculation: 480 R Axis:   -59 Text Interpretation:  Atrial fibrillation Ventricular bigeminy Nonspecific  IVCD with LAD Anteroseptal infarct, old Nonspecific T abnormalities,  lateral leads Confirmed by [email or web address]  MD, SCOTT [phone]) on 05/26/2016  4:13:44 PM      MDM   Final diagnoses:  Dehydration  Hypotension, unspecified hypotension type    82 year old male here with weakness and hypotension also with a slight AKI. These are likely secondary to dehydration. Initially planned for discharge home however patient with dropping  blood pressures again so admitted to medicine for further hydration and workup.  Merrily Pew, MD 05/27/16 [phone]

## 2016-05-26 NOTE — ED Notes (Signed)
Attempts x 2 with [place] -NOT SUCCESSFUL

## 2016-05-26 NOTE — ED Notes (Signed)
BLOOD CULTURE X 1 

## 2016-05-26 NOTE — ED Notes (Signed)
Per [facility]- Pt c/o onset generalized weakness. Home BP 70"s lying in recliner. EMS confirmed on arrival at [id number] systolic. Denies N/V/D and fever CP/SOB. Discharged from [facility] for SNF for outpt antibiotic (SEPSIS PNEUMONIA). New onset of Ca (differnt type) too sick to perform PET at that time.

## 2016-05-26 NOTE — ED Notes (Signed)
Nurse in the room attempting to collect labs

## 2016-05-26 NOTE — ED Notes (Signed)
BLOOD CULTURE X 2 OBTAINED

## 2016-05-27 DIAGNOSIS — I480 Paroxysmal atrial fibrillation: Secondary | ICD-10-CM

## 2016-05-27 DIAGNOSIS — I5022 Chronic systolic (congestive) heart failure: Secondary | ICD-10-CM | POA: Diagnosis not present

## 2016-05-27 DIAGNOSIS — E860 Dehydration: Secondary | ICD-10-CM

## 2016-05-27 DIAGNOSIS — R5381 Other malaise: Secondary | ICD-10-CM

## 2016-05-27 DIAGNOSIS — E785 Hyperlipidemia, unspecified: Secondary | ICD-10-CM

## 2016-05-27 DIAGNOSIS — C8318 Mantle cell lymphoma, lymph nodes of multiple sites: Secondary | ICD-10-CM

## 2016-05-27 DIAGNOSIS — Z954 Presence of other heart-valve replacement: Secondary | ICD-10-CM

## 2016-05-27 DIAGNOSIS — E43 Unspecified severe protein-calorie malnutrition: Secondary | ICD-10-CM | POA: Insufficient documentation

## 2016-05-27 DIAGNOSIS — J449 Chronic obstructive pulmonary disease, unspecified: Secondary | ICD-10-CM

## 2016-05-27 DIAGNOSIS — I959 Hypotension, unspecified: Secondary | ICD-10-CM | POA: Diagnosis not present

## 2016-05-27 DIAGNOSIS — D61818 Other pancytopenia: Secondary | ICD-10-CM

## 2016-05-27 DIAGNOSIS — E1159 Type 2 diabetes mellitus with other circulatory complications: Secondary | ICD-10-CM

## 2016-05-27 DIAGNOSIS — N400 Enlarged prostate without lower urinary tract symptoms: Secondary | ICD-10-CM | POA: Diagnosis not present

## 2016-05-27 LAB — GLUCOSE, CAPILLARY
GLUCOSE-CAPILLARY: 136 mg/dL — AB (ref 65–99)
GLUCOSE-CAPILLARY: 146 mg/dL — AB (ref 65–99)
Glucose-Capillary: 117 mg/dL — ABNORMAL HIGH (ref 65–99)
Glucose-Capillary: 178 mg/dL — ABNORMAL HIGH (ref 65–99)

## 2016-05-27 LAB — TROPONIN I

## 2016-05-27 LAB — CBC
HEMATOCRIT: 29.8 % — AB (ref 39.0–52.0)
Hemoglobin: 9.7 g/dL — ABNORMAL LOW (ref 13.0–17.0)
MCH: 29.2 pg (ref 26.0–34.0)
MCHC: 32.6 g/dL (ref 30.0–36.0)
MCV: 89.8 fL (ref 78.0–100.0)
Platelets: 78 10*3/uL — ABNORMAL LOW (ref 150–400)
RBC: 3.32 MIL/uL — AB (ref 4.22–5.81)
RDW: 21.6 % — ABNORMAL HIGH (ref 11.5–15.5)
WBC: 7.7 10*3/uL (ref 4.0–10.5)

## 2016-05-27 LAB — BASIC METABOLIC PANEL
ANION GAP: 9 (ref 5–15)
BUN: 55 mg/dL — ABNORMAL HIGH (ref 6–20)
CHLORIDE: 102 mmol/L (ref 101–111)
CO2: 27 mmol/L (ref 22–32)
Calcium: 9.3 mg/dL (ref 8.9–10.3)
Creatinine, Ser: 1.17 mg/dL (ref 0.61–1.24)
GFR calc non Af Amer: 55 mL/min — ABNORMAL LOW (ref >60)
Glucose, Bld: 122 mg/dL — ABNORMAL HIGH (ref 65–99)
POTASSIUM: 4.6 mmol/L (ref 3.5–5.1)
SODIUM: 138 mmol/L (ref 135–145)

## 2016-05-27 LAB — CREATININE, URINE, RANDOM: CREATININE, URINE: 25.19 mg/dL

## 2016-05-27 LAB — MAGNESIUM: Magnesium: 1.5 mg/dL — ABNORMAL LOW (ref 1.7–2.4)

## 2016-05-27 MED ORDER — SODIUM CHLORIDE 0.9 % IV SOLN: INTRAVENOUS | Status: AC

## 2016-05-27 NOTE — Care Management Note (Signed)
Case Management Note  Patient Details  Name: VALDIS OBENSHAIN MRN: [id number] Date of Birth: [birth year]  Subjective/Objective:82 y/o m admitted w/syncope. Hx: copd, pna. From home. Has dme, active w/[facility] rep Abby following. Please order HHRN/PT/OT/aide social workder,[id number] @ d/c. Await PT recc. [facility] referral.                   Action/Plan:d/c plan home w/HHC.   Expected Discharge Date:   (unknown)               Expected Discharge Plan:  [place]  In-House Referral:     Discharge planning Services  CM Consult  Post Acute Care Choice:  Home Health (Active with [facility]) Choice offered to:     DME Arranged:    DME Agency:     HH Arranged:    HH Agency:     Status of Service:  In process, will continue to follow  If discussed at Long Length of Stay Meetings, dates discussed:    Additional Comments:  Dessa Phi, RN 05/27/2016, 12:24 PM

## 2016-05-27 NOTE — Care Management Obs Status (Signed)
[place] NOTIFICATION   Patient Details  Name: VINCIENT SCHIRRA MRN: [id number] Date of Birth: 1927-04-10   Medicare Observation Status Notification Given:  Yes    MahabirJuliann Pulse, RN 05/27/2016, 12:22 PM

## 2016-05-27 NOTE — Evaluation (Signed)
Physical Therapy Evaluation Patient Details Name: [name] MRN: [id number] DOB: December 31, 1926 Today's Date: 05/27/2016   History of Present Illness  82 yo male admitted with dehydration, hypotension. Hx of CAD, CABG, PAF, aortic stenosis, HTN, lymphoma, L foot transmet amputation. Recent stay at  [facility] for rehab.   Clinical Impression  On eval, pt required Min-Mod assist for mobility-walked ~40 feet with RW. Pt is weak,very unsteady and at risk for falls. Pt just completed a recent SNF stay and was home for a short period before returning to hospital. Recommend RW use for safety when ambulating. Unsure if wife is able to provide current level of assist. Recommend ST rehab at SNF if pt is agreeable. If pt decides to return home, recommend HHPT and 24 hour supervision/assist.    Follow Up Recommendations SNF vs HHPT;Supervision/Assistance - 24 hour    Equipment Recommendations  Rolling walker with 5" wheels (if pt doesn't already have one)    Recommendations for Other Services OT consult     Precautions / Restrictions Precautions Precautions: Fall Restrictions Weight Bearing Restrictions: No      Mobility  Bed Mobility Overal bed mobility: Needs Assistance Bed Mobility: Supine to Sit;Sit to Supine       Sit to supine: Min guard   General bed mobility comments: close guard for safety  Transfers Overall transfer level: Needs assistance Equipment used: 1 person hand held assist Transfers: Sit to/from Stand Sit to Stand: Min assist         General transfer comment: Assist to rise, stabilize, control descent.   Ambulation/Gait Ambulation/Gait assistance: Min assist Ambulation Distance (Feet): 40 Feet Assistive device: 1 person hand held assist +hallway handrail Gait Pattern/deviations: Decreased stride length;Drifts right/left;Staggering left;Staggering right     General Gait Details: Very unsteady. Attempted to walk with 1 hand held assist but pt was unable so had  him use the handrail as well. Fatigues very easily.   Stairs            Wheelchair Mobility    Modified Rankin (Stroke Patients Only)       Balance Overall balance assessment: Needs assistance         Standing balance support: During functional activity Standing balance-Leahy Scale: Poor                               Pertinent Vitals/Pain Pain Assessment: No/denies pain    Home Living Family/patient expects to be discharged to:: Private residence Living Arrangements: Spouse/significant other Available Help at Discharge: Family Type of Home: House Home Access: Stairs to enter   Technical brewer of Steps: 2 Home Layout: One level Home Equipment: Cane - single point;Shower seat;Grab bars - tub/shower;Walker - 2 wheels      Prior Function Level of Independence: Independent with assistive device(s)         Comments: is supposed to use a cane but often forgets     Hand Dominance        Extremity/Trunk Assessment   Upper Extremity Assessment: Generalized weakness           Lower Extremity Assessment: Generalized weakness      Cervical / Trunk Assessment: Kyphotic  Communication   Communication: No difficulties  Cognition Arousal/Alertness: Awake/alert Behavior During Therapy: WFL for tasks assessed/performed Overall Cognitive Status: Within Functional Limits for tasks assessed  General Comments      Exercises        Assessment/Plan    PT Assessment Patient needs continued PT services  PT Diagnosis Difficulty walking;Generalized weakness   PT Problem List Decreased strength;Decreased activity tolerance;Decreased balance;Decreased mobility;Decreased knowledge of use of DME  PT Treatment Interventions DME instruction;Gait training;Functional mobility training;Therapeutic activities;Patient/family education;Balance training;Therapeutic exercise   PT Goals (Current goals can be found in the  Care Plan section) Acute Rehab PT Goals Patient Stated Goal: home PT Goal Formulation: With patient Time For Goal Achievement: 06/10/16 Potential to Achieve Goals: Good    Frequency Min 3X/week   Barriers to discharge        Co-evaluation               End of Session Equipment Utilized During Treatment: Gait belt Activity Tolerance: Patient limited by fatigue Patient left: in bed;with call bell/phone within reach;with bed alarm set      Functional Assessment Tool Used: clinical judgement Functional Limitation: Mobility: Walking and moving around Mobility: Walking and Moving Around Current Status [id number]): At least 1 percent but less than 20 percent impaired, limited or restricted Mobility: Walking and Moving Around Goal Status [phone]): At least 20 percent but less than 40 percent impaired, limited or restricted    Time: 1045-1059 PT Time Calculation (min) (ACUTE ONLY): 14 min   Charges:   PT Evaluation $PT Eval Low Complexity: 1 Procedure     PT G Codes:   PT G-Codes **NOT FOR INPATIENT CLASS** Functional Assessment Tool Used: clinical judgement Functional Limitation: Mobility: Walking and moving around Mobility: Walking and Moving Around Current Status [id number]): At least 1 percent but less than 20 percent impaired, limited or restricted Mobility: Walking and Moving Around Goal Status [phone]): At least 20 percent but less than 40 percent impaired, limited or restricted    Weston Anna, MPT Pager: [phone]

## 2016-05-27 NOTE — Progress Notes (Signed)
PROGRESS NOTE    [name]  [id number] DOB: 1927/08/29 DOA: 05/26/2016 PCP: Donnajean Lopes, MD   Brief Narrative:  HPI on 05/26/2016 by Dr. Fuller Plan [name] is a 82 y.o. male with medical history significant of CAD s/p CABG, PAF, aortic stenosis s/p TAVR, HTN, HLD, BPH, Mantle cell lymphoma, anemia, and thrombocytopenia; who presents after having low blood pressures at home. Patient had just been home with his wife for the last 4 days. He was admitted into the hospital from 5/2 - 5/9 and then went to [facility] where he stayed 6 weeks after being found to have pneumonia with enterococcal septicemia requiring IV antibiotics through PICC line. He had been doing well per family and had just seen his oncologist Dr. Alvy Bimler 5 days ago for his mantle cell lymphoma which appeared to have spread, but he was noted not to be in shape for any treatment at this time. Over the last few weeks family notes that he's had multiple falls, generalized weakness, and his appetite has been poor. They state he's been drinking adequate amounts of fluid, but he strains for very long time before he is able to urinate. He denies any fever, chills, shortness of breath, or chest pain. He previously had been having some lower leg swelling which they have been wrapping his legs and family notes that that has improved. However today the patient was more weak than usual and was not able to ambulate well. Systolic blood pressures while patient was lying in his recliner at home were noted to be in the 70s. EMS was called and brought him to the hospital.   Assessment & Plan   Hypotension  -Likely secondary to dehydration -Patient with systolic blood pressures in the 70s at home and on admission -Improved with IVF -Troponins cycled and negative -Continue to monitor closely -Continue coreg and spironolactone with holding parameters -Lasix held  Generalized weakness/debility -Physical therapy to eval and  treat  Acute kidney injury on chronic kidney disease, stage III -baseline creatinine around 1 to 1.2 -Upon admission Creatinine 1.46, Currently 1.17 -Possibly due to hypotension and overdiuresis   Paroxysmal atrial fibrillation  -Chadsvasc score = 6 , but patient has a poor candidate for anticoagulation secondary to history of falls and thrombocytopenia.  -Rate controlled, continue coreg -Continue to monitor  Chronic Combined systolic and diastolic congestive heart failure -Last echo in 04/2016 showed EF of 25-30%. -Monitor intake/output, daily weights -Currently appears to be euvolemic -On coreg and spironolactone (with holding parameters) -Lasix and Potassium held due to AKI and hypotension  Mental cell lymphoma  -Patient poor candidate for treatment at this time -Follows with Dr. Alvy Bimler  CAD s/p CABG/aortic stenosis s/p TAVR -Continue Plavix and aspirin  COPD without acute exacerbation -Continue Anoro Ellipta and Albuterol PRN  Pressure ulcer of sacrum -Dress wounds with protective barrier -Stage II  Diabetes mellitus type 2 -Hemoglobin A1c was 6.3 and 03/2016 -Metformin held -Continue ISS and CBG monitoring   Chronic pancytopenia  -Patient with hemoglobin of 10.4 and platelets of 66 on admission.  -No active signs of bleeding noted. -Continue iron supplementation -Hemoglobin currently 9.7, platelets 78 -Monitor CBC  Question of urinary retention with history of BPH -UOP 1400cc since admission -Continue to monitor closely, patient may need foley  Elevated alkaline phosphatase -acute on chronic.  -Alkaline phosphatase elevated at 196 on admission. -continue to monitor  Hyperlipidemia -continue pravastatin  GERD  -continue Pepcid   Severe malnutrition -Nutrition consulted -Continue nutritional supplements  DVT Prophylaxis  SCDs  Code Status: DNR  Family Communication: None at bedside  Disposition Plan: Admitted for  observatino  Consultants None  Procedures  None  Antibiotics   Anti-infectives    None      Subjective:   [name] seen and examined today. Patient feels tired.  Denies chest pain, shortness of breath, abdominal pain, dizziness, headache.  Objective:   Filed Vitals:   05/26/16 2258 05/27/16 0548 05/27/16 0752 05/27/16 1052  BP: 124/66 108/50 123/50   Pulse: 53 53 78   Temp: 98.2 F (36.8 C) 98.4 F (36.9 C)    TempSrc: Oral Oral    Resp: 24 22 20    Height:      Weight:  69.264 kg (152 lb 11.2 oz)    SpO2: 99% 97% 97% 100%    Intake/Output Summary (Last 24 hours) at 05/27/16 1236 Last data filed at 05/27/16 0554  Gross per 24 hour  Intake    500 ml  Output   1400 ml  Net   -900 ml   Filed Weights   05/26/16 1557 05/26/16 2200 05/27/16 0548  Weight: 72.122 kg (159 lb) 69.264 kg (152 lb 11.2 oz) 69.264 kg (152 lb 11.2 oz)    Exam  General: Well developed, elderly, ill-appearing, NAD  HEENT: NCAT, mucous membranes moist.   Cardiovascular: S1 S2 auscultated, irregular  Respiratory: Clear to auscultation bilaterally  Abdomen: Soft, nontender, nondistended, + bowel sounds  Extremities: warm dry without cyanosis clubbing, LE edema- legs currently wrapped  Neuro: AAOx3, nonfocal  Psych: Normal affect and demeanor    Data Reviewed: I have personally reviewed following labs and imaging studies  CBC:  Recent Labs Lab 05/26/16 1724 05/27/16 0351  WBC 8.2 7.7  HGB 10.4* 9.7*  HCT 31.6* 29.8*  MCV 87.8 89.8  PLT 66* 78*   Basic Metabolic Panel:  Recent Labs Lab 05/26/16 1724 05/27/16 0351  NA 137 138  K 4.4 4.6  CL 96* 102  CO2 30 27  GLUCOSE 157* 122*  BUN 62* 55*  CREATININE 1.46* 1.17  CALCIUM 9.8 9.3  MG  --  1.5*   GFR: Estimated Creatinine Clearance: 44.4 mL/min (by C-G formula based on Cr of 1.17). Liver Function Tests:  Recent Labs Lab 05/26/16 1724  AST 34  ALT 25  ALKPHOS 196*  BILITOT 0.8  PROT 6.3*  ALBUMIN  3.7   No results for input(s): LIPASE, AMYLASE in the last 168 hours. No results for input(s): AMMONIA in the last 168 hours. Coagulation Profile: No results for input(s): INR, PROTIME in the last 168 hours. Cardiac Enzymes:  Recent Labs Lab 05/26/16 2319 05/27/16 0146 05/27/16 0351  TROPONINI <0.03 <0.03 <0.03   BNP (last 3 results) No results for input(s): PROBNP in the last 8760 hours. HbA1C: No results for input(s): HGBA1C in the last 72 hours. CBG:  Recent Labs Lab 05/26/16 1642 05/27/16 0725 05/27/16 1156  GLUCAP 168* 117* 178*   Lipid Profile: No results for input(s): CHOL, HDL, LDLCALC, TRIG, CHOLHDL, LDLDIRECT in the last 72 hours. Thyroid Function Tests: No results for input(s): TSH, T4TOTAL, FREET4, T3FREE, THYROIDAB in the last 72 hours. Anemia Panel: No results for input(s): VITAMINB12, FOLATE, FERRITIN, TIBC, IRON, RETICCTPCT in the last 72 hours. Urine analysis:    Component Value Date/Time   COLORURINE YELLOW 05/26/2016 [place] 05/26/2016 1608   LABSPEC 1.009 05/26/2016 1608   LABSPEC 1.030 03/16/2012 1236   PHURINE 7.0 05/26/2016 1608   PHURINE  5.0 03/16/2012 1236   GLUCOSEU NEGATIVE 05/26/2016 1608   HGBUR NEGATIVE 05/26/2016 1608   HGBUR Trace 03/16/2012 1236   BILIRUBINUR NEGATIVE 05/26/2016 1608   BILIRUBINUR Negative 03/16/2012 [place] 05/26/2016 1608   KETONESUR Negative 03/16/2012 1236   PROTEINUR NEGATIVE 05/26/2016 1608   PROTEINUR 30 03/16/2012 1236   UROBILINOGEN 1.0 11/19/2014 2003   NITRITE NEGATIVE 05/26/2016 1608   NITRITE Negative 03/16/2012 1236   LEUKOCYTESUR NEGATIVE 05/26/2016 1608   LEUKOCYTESUR Negative 03/16/2012 1236   Sepsis Labs: @LABRCNTIP (procalcitonin:4,lacticidven:4)  )No results found for this or any previous visit (from the past 240 hour(s)).    Radiology Studies: Dg Chest 2 View  05/26/2016  CLINICAL DATA:  Generalized weakness.  Decreased blood pressure. EXAM: CHEST   2 VIEW COMPARISON:  04/08/2016 FINDINGS: There changes from aortic valve replacement, stable. There stable changes from CABG surgery. Cardiac silhouette is mildly enlarged. No mediastinal or hilar masses or adenopathy are noted. Clear lungs.  No pleural effusion or pneumothorax. Bony thorax is demineralized but grossly intact. IMPRESSION: No acute cardiopulmonary disease. Electronically Signed   By: Lajean Manes M.D.   On: 05/26/2016 18:24     Scheduled Meds: . aspirin EC  81 mg Oral Daily  . carvedilol  3.125 mg Oral BID WC  . clopidogrel  75 mg Oral Daily  . famotidine  20 mg Oral QHS  . feeding supplement (ENSURE ENLIVE)  237 mL Oral BID BM  . ferrous sulfate  325 mg Oral Q breakfast  . gabapentin  300 mg Oral BID  . insulin aspart  0-9 Units Subcutaneous TID WC  . multivitamin with minerals  1 tablet Oral Daily  . pravastatin  40 mg Oral QPM  . spironolactone  12.5 mg Oral Daily  . umeclidinium-vilanterol  1 puff Inhalation Daily  . vitamin B-12  1,000 mcg Oral Daily   Continuous Infusions:      Time Spent in minutes   30 minutes  [name] D.O. on 05/27/2016 at 12:36 PM  Between 7am to 7pm - Pager - [phone]  After 7pm go to [email or web address] - password [email or web address]  And look for the night coverage person covering for me after hours  [facility] Office  [phone]

## 2016-05-27 NOTE — Consult Note (Signed)
   [facility] Inpatient Consult   05/27/2016  ZACHAI QUATTRONE 09/02/1927 [id number]    [facility] Care Management referral received. Went to bedside to speak with patient and wife. Mr. Tamblyn was asleep. Patient's wife states the plan is to go to SNF at discharge. Inpatient Licensed CSW already following for disposition. Mrs. Russin declines [place] Management services at this time. Provided her with contact information and [facility] Care Management brochure to contact if needed. Will make inpatient RNCM aware.    Marthenia Rolling, MSN-Ed, RN,BSN [facility] Liaison [phone]

## 2016-05-27 NOTE — Progress Notes (Signed)
Patient urinated 150cc. Bladder scan showed >491cc residual. Will place foley per previous order. Mendi Constable, Bing Neighbors, RN

## 2016-05-27 NOTE — Progress Notes (Signed)
Initial Nutrition Assessment  DOCUMENTATION CODES:   Severe malnutrition in context of acute illness/injury  INTERVENTION:  - Continue order for Ensure Enlive BID and TID PRN. Each supplement provides 350 kcal and 20 grams of protein. - Continue to encourage PO intakes of meals and supplements. - Monitor magnesium, potassium, and phosphorus daily for at least 3 days, MD to replete as needed, as pt is at risk for refeeding syndrome given prolonged period of poor appetite and intakes followed by recent surge in appetite and intakes. - RD will continue to monitor for additional needs.  NUTRITION DIAGNOSIS:   Inadequate oral intake related to acute illness, poor appetite as evidenced by per patient/family report.  GOAL:   Patient will meet greater than or equal to 90% of their needs  MONITOR:   PO intake, Supplement acceptance, Weight trends, Labs, Skin, I & O's  REASON FOR ASSESSMENT:   Malnutrition Screening Tool  ASSESSMENT:   82 y.o. male with medical history significant of CAD s/p CABG, PAF, aortic stenosis s/p TAVR, HTN, HLD, BPH, Mantle cell lymphoma, anemia, and thrombocytopenia; who presents after having low blood pressures at home. Patient had just been home with his wife for the last 4 days. He was admitted into the hospital from 5/2 - 5/9 and then went to [facility] where he stayed 6 weeks after being found to have pneumonia with enterococcal septicemia requiring IV antibiotics through PICC line. He had been doing well per family and had just seen his oncologist Dr. Alvy Bimler 5 days ago for his mantle cell lymphoma which appeared to have spread, but he was noted not to be in shape for any treatment at this time. Over the last few weeks family notes that he's had multiple falls, generalized weakness, and his appetite has been poor. They state he's been drinking adequate amounts of fluid, but he strains for very long time before he is able to urinate. He denies any fever, chills,  shortness of breath, or chest pain. He previously had been having some lower leg swelling which they have been wrapping his legs and family notes that that has improved. However today the patient was more weak than usual and was not able to ambulate well.   Pt seen for MST. BMI indicates normal weight. No intakes documented since admission. Visualized lunch tray with 75% completion and pt continued to eat after RD visit; mashed potatoes, [place], peaches, collard greens. Pt denies chewing or swallowing difficulties with these types of foods. He had a poor appetite while at nursing home previously, per family, but appetite began to improve on 05/21/16 or 05/22/16. Since that time, intakes have been much better and pt has been stating that he is hungry. Pt was provided with nutrition supplement at the facility and is currently ordered Ensure as outlined above. Family interested in continuing this supplement; RN reports that pt enjoys strawberry-flavored Ensure.   Physical assessment shows moderate muscle and moderate fat wasting to upper body. Did not assess lower body as pt was eating and bedside table over his legs. Per review, pt has lost 17 lbs (10% body weight) in <1 month which is significant for time frame.   Pt not meeting needs PTA based on weight hx and report of family; improving at this time. Medications reviewed;  325 mg ferrous sulfate/day, sliding scale Novolog, daily multivitamin with minerals, PRN Zofran, 1000 mcg vitamin B12/day. Labs reviewed; BUN: 55 mg/dL, Mg: 1.5 mg/dL, GFR: 55 mL/min.   Diet Order:  Diet heart  healthy/carb modified Room service appropriate?: Yes; Fluid consistency:: Thin  Skin:  Wound (see comment) (Stage 2 sacral pressure injury)  Last BM:  PTA  Height:   Ht Readings from Last 1 Encounters:  05/26/16 6' (1.829 m)    Weight:   Wt Readings from Last 1 Encounters:  05/27/16 152 lb 11.2 oz (69.264 kg)    Ideal Body Weight:  80.91 kg (kg)  BMI:  Body mass  index is 20.71 kg/(m^2).  Estimated Nutritional Needs:   Kcal:  1600-1800  Protein:  75-85 grams  Fluid:  1.8-2 L/day  EDUCATION NEEDS:   No education needs identified at this time     Richard Matin, MS, RD, LDN Inpatient Clinical Dietitian Pager # [phone] After hours/weekend pager # [phone]

## 2016-05-27 NOTE — NC FL2 (Signed)
[place] LEVEL OF CARE SCREENING TOOL     IDENTIFICATION  Patient Name: [name] Birthdate: [birth year] Sex: male Admission Date (Current Location): 05/26/2016  [place] and [place] Number:  Herbalist and Address:  [facility], [address] [place], [place] [address]      Provider Number: [id number]  Attending Physician Name and Address:  Cristal Ford, DO  Relative Name and Phone Number:       Current Level of Care: Hospital Recommended Level of Care: [place] Prior Approval Number:    Date Approved/Denied:   PASRR Number: [id number]  Discharge Plan: SNF    Current Diagnoses: Patient Active Problem List   Diagnosis Date Noted  . Protein-calorie malnutrition, severe 05/27/2016  . Hypotension 05/26/2016  . Dehydration 05/26/2016  . Pancytopenia, acquired ([place]) 05/21/2016  . Protein-calorie malnutrition, moderate ([place]) 05/21/2016  . Cough 05/13/2016  . Debility 04/25/2016  . COPD (chronic obstructive pulmonary disease) ([place]) 04/22/2016  . Chronic systolic (congestive) heart failure ([place])   . Prosthetic valve endocarditis ([place])   . Enterococcal sepsis ([place]) 04/08/2016  . Paroxysmal atrial fibrillation (H[facility])   . Wheezing   . Pressure ulcer 04/07/2016  . Ascites   . Sepsis ([place])   . Epigastric abdominal pain 03/12/2016  . Type 2 diabetes mellitus with vascular disease ([place]) 11/20/2014  . S/P TAVR (transcatheter aortic valve replacement) 11/05/2014  . Coronary atherosclerosis of native coronary artery 08/09/2014  . Leg DVT (deep venous thromboembolism), chronic ([place])   . History of echocardiogram   . Syncope 07/21/2014  . Mantle cell lymphoma ([place]) 02/28/2013  . Carotid artery disease ([place]) 02/28/2013  . Abnormal EKG 11/21/2012  . Peripheral arterial disease ([place]) 09/13/2012    Class: Chronic  . Dermatitis 06/07/2012  . Splenomegaly 04/13/2012  . Neuralgia 03/16/2012  . BPH (benign  prostatic hyperplasia) 03/16/2012  . Hyperlipidemia 03/16/2012  . Thrombocytopenia ([place])   . Pedal edema 02/08/2012  . Anemia 02/08/2012  . Carotid bruit 12/21/2011  . Essential hypertension 12/19/2009  . CEREBROVASCULAR DISEASE 12/19/2009    Orientation RESPIRATION BLADDER Height & Weight     Self, Situation, Place  Normal Incontinent Weight: 152 lb 11.2 oz (69.264 kg) Height:  6' (182.9 cm)  BEHAVIORAL SYMPTOMS/MOOD NEUROLOGICAL BOWEL NUTRITION STATUS      Continent Diet (Heart Healthy/Carb Modified)  AMBULATORY STATUS COMMUNICATION OF NEEDS Skin   Extensive Assist Verbally PU Stage and Appropriate Care (PressureUlcer06/21/17StageII-Partialthicknesslossofdermispresentingasashallowopenulcerwithared,pinkwoundbedwithoutslough.smallopenareaoversacrumsurrounded (sacrum))                       Personal Care Assistance Level of Assistance  Bathing, Dressing Bathing Assistance: Limited assistance   Dressing Assistance: Limited assistance     Functional Limitations Info             SPECIAL CARE FACTORS FREQUENCY  PT (By licensed PT), OT (By licensed OT)     PT Frequency: 5 OT Frequency: 5            Contractures Contractures Info: Not present    Additional Factors Info  Code Status, Allergies Code Status Info: DNR Allergies Info: Allergies:  Losartan, Azithromycin, Contrast Media, Doxycycline, Keflex, Macrodantin, Minocycline Hcl, Nitrofurantoin, Zithromax           Current Medications (05/27/2016):  This is the current hospital active medication list Current Facility-Administered Medications  Medication Dose Route Frequency Provider Last Rate Last Dose  . acetaminophen (TYLENOL) tablet 650 mg  650 mg Oral Q6H PRN Rondell  Charmayne Sheer, MD       Or  . acetaminophen (TYLENOL) suppository 650 mg  650 mg Rectal Q6H PRN Norval Morton, MD      . albuterol (PROVENTIL) (2.5 MG/3ML) 0.083% nebulizer solution 2.5 mg  2.5 mg Nebulization  Q2H PRN Norval Morton, MD      . aspirin EC tablet 81 mg  81 mg Oral Daily Norval Morton, MD   81 mg at 05/27/16 1002  . carvedilol (COREG) tablet 3.125 mg  3.125 mg Oral BID WC Norval Morton, MD   3.125 mg at 05/27/16 0759  . clopidogrel (PLAVIX) tablet 75 mg  75 mg Oral Daily Norval Morton, MD   75 mg at 05/27/16 1002  . docusate sodium (COLACE) capsule 100 mg  100 mg Oral Daily PRN Norval Morton, MD      . famotidine (PEPCID) tablet 20 mg  20 mg Oral QHS Norval Morton, MD   20 mg at 05/27/16 0009  . feeding supplement (ENSURE ENLIVE) (ENSURE ENLIVE) liquid 237 mL  237 mL Oral BID BM Rondell Charmayne Sheer, MD   237 mL at 05/27/16 1003  . feeding supplement (ENSURE ENLIVE) (ENSURE ENLIVE) liquid 237 mL  237 mL Oral TID PRN Norval Morton, MD      . ferrous sulfate tablet 325 mg  325 mg Oral Q breakfast Norval Morton, MD   325 mg at 05/27/16 0759  . gabapentin (NEURONTIN) capsule 300 mg  300 mg Oral BID Norval Morton, MD   300 mg at 05/27/16 1002  . guaiFENesin (MUCINEX) 12 hr tablet 600 mg  600 mg Oral BID PRN Norval Morton, MD      . hydrOXYzine (ATARAX/VISTARIL) tablet 25 mg  25 mg Oral Q6H PRN Rondell A Tamala Julian, MD      . insulin aspart (novoLOG) injection 0-9 Units  0-9 Units Subcutaneous TID WC Norval Morton, MD   2 Units at 05/27/16 1214  . multivitamin with minerals tablet 1 tablet  1 tablet Oral Daily Norval Morton, MD   1 tablet at 05/27/16 1003  . nitroGLYCERIN (NITROSTAT) SL tablet 0.4 mg  0.4 mg Sublingual Q5 min PRN Norval Morton, MD      . ondansetron (ZOFRAN) tablet 4 mg  4 mg Oral Q6H PRN Norval Morton, MD       Or  . ondansetron (ZOFRAN) injection 4 mg  4 mg Intravenous Q6H PRN Rondell A Tamala Julian, MD      . phenol (CHLORASEPTIC) mouth spray 2 spray  2 spray Mouth/Throat Q2H PRN Rondell A Tamala Julian, MD      . polyethylene glycol (MIRALAX / GLYCOLAX) packet 17 g  17 g Oral Daily PRN Norval Morton, MD      . pravastatin (PRAVACHOL) tablet 40 mg  40 mg Oral QPM Norval Morton, MD   40 mg at 05/27/16 0009  . spironolactone (ALDACTONE) tablet 12.5 mg  12.5 mg Oral Daily Norval Morton, MD   12.5 mg at 05/27/16 1003  . traMADol (ULTRAM) tablet 50 mg  50 mg Oral Q12H PRN Norval Morton, MD      . umeclidinium-vilanterol (ANORO ELLIPTA) 62.5-25 MCG/INH 1 puff  1 puff Inhalation Daily Norval Morton, MD   1 puff at 05/27/16 1052  . vitamin B-12 (CYANOCOBALAMIN) tablet 1,000 mcg  1,000 mcg Oral Daily Norval Morton, MD   1,000 mcg at 05/27/16 1002  Discharge Medications: Please see discharge summary for a list of discharge medications.  Relevant Imaging Results:  Relevant Lab Results:   Additional Information SS # [id number]  Standley Brooking, LCSW

## 2016-05-28 DIAGNOSIS — N400 Enlarged prostate without lower urinary tract symptoms: Secondary | ICD-10-CM | POA: Diagnosis not present

## 2016-05-28 DIAGNOSIS — E860 Dehydration: Secondary | ICD-10-CM | POA: Diagnosis not present

## 2016-05-28 DIAGNOSIS — I959 Hypotension, unspecified: Secondary | ICD-10-CM | POA: Diagnosis not present

## 2016-05-28 DIAGNOSIS — J449 Chronic obstructive pulmonary disease, unspecified: Secondary | ICD-10-CM | POA: Diagnosis not present

## 2016-05-28 DIAGNOSIS — I5022 Chronic systolic (congestive) heart failure: Secondary | ICD-10-CM | POA: Diagnosis not present

## 2016-05-28 LAB — CBC
HEMATOCRIT: 29 % — AB (ref 39.0–52.0)
HEMOGLOBIN: 9.3 g/dL — AB (ref 13.0–17.0)
MCH: 29.2 pg (ref 26.0–34.0)
MCHC: 32.1 g/dL (ref 30.0–36.0)
MCV: 90.9 fL (ref 78.0–100.0)
Platelets: 63 10*3/uL — ABNORMAL LOW (ref 150–400)
RBC: 3.19 MIL/uL — ABNORMAL LOW (ref 4.22–5.81)
RDW: 22 % — AB (ref 11.5–15.5)
WBC: 5.1 10*3/uL (ref 4.0–10.5)

## 2016-05-28 LAB — GLUCOSE, CAPILLARY
GLUCOSE-CAPILLARY: 149 mg/dL — AB (ref 65–99)
Glucose-Capillary: 161 mg/dL — ABNORMAL HIGH (ref 65–99)

## 2016-05-28 LAB — BASIC METABOLIC PANEL
Anion gap: 7 (ref 5–15)
BUN: 56 mg/dL — AB (ref 6–20)
CALCIUM: 9.3 mg/dL (ref 8.9–10.3)
CHLORIDE: 104 mmol/L (ref 101–111)
CO2: 29 mmol/L (ref 22–32)
CREATININE: 1.15 mg/dL (ref 0.61–1.24)
GFR calc Af Amer: >60 mL/min (ref >60)
GFR calc non Af Amer: 56 mL/min — ABNORMAL LOW (ref >60)
Glucose, Bld: 133 mg/dL — ABNORMAL HIGH (ref 65–99)
Potassium: 4.7 mmol/L (ref 3.5–5.1)
Sodium: 140 mmol/L (ref 135–145)

## 2016-05-28 LAB — UREA NITROGEN, URINE: Urea Nitrogen, Ur: 334 mg/dL

## 2016-05-28 MED ORDER — TRAMADOL HCL 50 MG PO TABS: 50.0000 mg | ORAL_TABLET | Freq: Two times a day (BID) | ORAL | Status: AC | PRN

## 2016-05-28 MED ORDER — ENSURE ENLIVE PO LIQD: 237.0000 mL | Freq: Two times a day (BID) | ORAL | Status: AC

## 2016-05-28 MED ORDER — FUROSEMIDE 20 MG PO TABS: 20.0000 mg | ORAL_TABLET | Freq: Two times a day (BID) | ORAL | Status: DC

## 2016-05-28 NOTE — Clinical Social Work Placement (Signed)
CSW met with patient, wife & daughter, [name] at bedside re: discharge planning. CSW reviewed PT evaluation recommending SNF at discharge. Patient had been to [facility] 5/9 - 6/18, discharged home and readmitted to [facility] 6/21. Patient's wife & daughter state that they would prefer [facility]. CSW confirmed with Heather at [facility] that they would be able to take patient.   Raynaldo Opitz, [facility] Clinical Social Worker cell #: [phone]     CLINICAL SOCIAL WORK PLACEMENT  NOTE  Date:  05/28/2016  Patient Details  Name: [name] MRN: [id number] Date of Birth: 17-Aug-1927  Clinical Social Work is seeking post-discharge placement for this patient at the [place] level of care (*CSW will initial, date and re-position this form in  chart as items are completed):  Yes   Patient/family provided with [place] Work Department's list of facilities offering this level of care within the geographic area requested by the patient (or if unable, by the patient's family).  Yes   Patient/family informed of their freedom to choose among providers that offer the needed level of care, that participate in Medicare, Medicaid or managed care program needed by the patient, have an available bed and are willing to accept the patient.  Yes   Patient/family informed of [facility]'s ownership interest in [facility] and [facility], as well as of the fact that they are under no obligation to receive care at these facilities.  PASRR submitted to EDS on       PASRR number received on       Existing PASRR number confirmed on 05/28/16     FL2 transmitted to all facilities in geographic area requested by pt/family on 05/28/16     FL2 transmitted to all facilities within larger geographic area on       Patient informed that his/her managed care company has contracts with or will negotiate with certain facilities,  including the following:        Yes   Patient/family informed of bed offers received.  Patient chooses bed at [place], [place]     Physician recommends and patient chooses bed at      Patient to be transferred to [place], [place] on  .  Patient to be transferred to facility by       Patient family notified on   of transfer.  Name of family member notified:        PHYSICIAN       Additional Comment:    _______________________________________________ Standley Brooking, LCSW 05/28/2016, 9:13 AM

## 2016-05-28 NOTE — Discharge Summary (Signed)
Physician Discharge Summary  [name] [id number] DOB: 18-Nov-1927 DOA: 05/26/2016  PCP: Donnajean Lopes, MD  Admit date: 05/26/2016 Discharge date: 05/28/2016  Time spent: 45 minutes  Recommendations for Outpatient Follow-up:  Patient will be discharged to skilled nursing facility.  Continue physical and occupational therapy.  Patient will need to follow up with primary care provider within one week of discharge.  Repeat CBC and CMP within one week.  Patient should continue medications as prescribed.  Patient should follow a heart healthy/carb modified diet.   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 Fuller Plan [name] is a 82 y.o. male with medical history significant of CAD s/p CABG, PAF, aortic stenosis s/p TAVR, HTN, HLD, BPH, Mantle cell lymphoma, anemia, and thrombocytopenia; who presents after having low blood pressures at home. Patient had just been home with his wife for the last 4 days. He was admitted into the hospital from 5/2 - 5/9 and then went to [facility] where he stayed 6 weeks after being found to have pneumonia with enterococcal septicemia requiring IV antibiotics through PICC line. He had been doing well per family and had just seen his oncologist Dr. Alvy Bimler 5 days ago for his  mantle cell lymphoma which appeared to have spread, but he was noted not to be in shape for any treatment at this time. Over the last few weeks family notes that he's had multiple falls, generalized weakness, and his appetite has been poor. They state he's been drinking adequate amounts of fluid, but he strains for very long time before he is able to urinate. He denies any fever, chills, shortness of breath, or chest pain. He previously had been having some lower leg swelling which they have been wrapping his legs and family notes that that has improved. However today the patient was more weak than usual and was not able to ambulate well. Systolic blood pressures while patient was lying in his recliner at home were noted to be in the 70s. EMS was called and brought him to the hospital.  Hospital Course:  Hypotension  -Likely secondary to dehydration -Patient with systolic blood pressures in the 70s at home and on admission -Improved with IVF -Troponins cycled and negative -Continue to monitor closely -Continue coreg and spironolactone with holding parameters -Lasix held  Generalized weakness/debility -Physical therapy to eval and treat  Acute kidney injury on chronic kidney disease, stage III -baseline creatinine around 1 to 1.2 -Upon admission Creatinine 1.46, Currently 1.15 -Possibly due to hypotension and overdiuresis   Paroxysmal atrial fibrillation  -Chadsvasc score = 6 , but patient has a poor candidate for anticoagulation secondary to history of falls and thrombocytopenia.  -Rate controlled, continue coreg -Continue to monitor  Chronic Combined systolic and diastolic congestive heart failure -Last echo in 04/2016 showed EF of 25-30%. -Monitor intake/output, daily weights -Currently appears to be euvolemic -On coreg and spironolactone (with holding parameters) -Lasix and Potassium  held due to AKI and hypotension -Would restart Lasix at discharge at low dose. Patient was  taking 80 mg twice a day.  Mental cell lymphoma  -Patient poor candidate for treatment at this time -Follows with Dr. Alvy Bimler  CAD s/p CABG/aortic stenosis s/p TAVR -Continue Plavix and aspirin  COPD without acute exacerbation -Continue Anoro Ellipta and Albuterol PRN  Pressure ulcer of sacrum -Dress wounds with protective barrier -Stage II  Diabetes mellitus type 2 -Hemoglobin A1c was 6.3 and 03/2016 -Metformin held, resume at discharge -During hospitalization, was placed on ISS and CBG monitoring   Chronic pancytopenia  -Patient with hemoglobin of 10.4 and platelets of 66 on admission.  -No active signs of bleeding noted. -Continue iron supplementation -Hemoglobin currently 9.3, platelets 63 -Repeat CBC in one week.  Question of urinary retention with history of BPH -UOP 2250cc since admission  Elevated alkaline phosphatase -acute on chronic.  -Alkaline phosphatase elevated at 196 on admission. -repeat in one week  Hyperlipidemia -continue pravastatin  GERD  -continue Pepcid   Severe malnutrition -Nutrition consulted -Continue nutritional supplements  Code Status: DNR  Consultants None  Procedures  None  Discharge Exam: Filed Vitals:   05/27/16 2000 05/28/16 0753  BP:  130/85  Pulse:  75  Temp: 98.3 F (36.8 C) 97.9 F (36.6 C)  Resp:  20   Exam  General: Well developed, elderly, ill-appearing, no distress  HEENT: NCAT, mucous membranes moist.   Cardiovascular: S1 S2 auscultated, irregular  Respiratory: Diminished breath sounds  Abdomen: Soft, nontender, nondistended, + bowel sounds  Extremities: warm dry without cyanosis clubbing, left toe amputations. LE edema- legs currently wrapped.   Neuro: AAOx3, nonfocal  Psych: Normal affect and demeanor, pleasant   Discharge Instructions      Discharge Instructions    Discharge instructions    Complete by:  As directed   Patient will be discharged to skilled nursing facility.   Continue physical and occupational therapy.  Patient will need to follow up with primary care provider within one week of discharge.  Repeat CBC and CMP within one week.  Patient should continue medications as prescribed.  Patient should follow a heart healthy/carb modified diet.            Medication List    STOP taking these medications        ampicillin 2 g in sodium chloride 0.9 % 50 mL     cefTRIAXone 2 g in dextrose 5 % 50 mL     potassium chloride SA 20 MEQ tablet  Commonly known as:  K-DUR,KLOR-CON      TAKE these medications        ANORO ELLIPTA 62.5-25 MCG/INH Aepb  Generic drug:  umeclidinium-vilanterol  Inhale 1 puff into the lungs daily.     aspirin EC 81 MG tablet  Take 81 mg by mouth every morning.     carvedilol 3.125 MG tablet  Commonly known as:  COREG  Take 1 tablet (3.125 mg total) by mouth 2 (two) times daily with a meal.     clopidogrel 75 MG tablet  Commonly known as:  PLAVIX  TAKE ONE TABLET BY MOUTH ONCE DAILY     docusate sodium 100 MG capsule  Commonly known as:  COLACE  Take 100 mg by mouth daily as needed for moderate constipation.     ENSURE  Take 237 mLs by mouth 3 (three) times daily as needed (for poor appetite).     feeding supplement (ENSURE ENLIVE) Liqd  Take  237 mLs by mouth 2 (two) times daily between meals.     famotidine 20 MG tablet  Commonly known as:  PEPCID  Take 1 tablet (20 mg total) by mouth at bedtime.     ferrous sulfate 325 (65 FE) MG tablet  Take 325 mg by mouth daily with breakfast.     furosemide 20 MG tablet  Commonly known as:  LASIX  Take 1 tablet (20 mg total) by mouth 2 (two) times daily.     gabapentin 300 MG capsule  Commonly known as:  NEURONTIN  Take 300 mg by mouth 2 (two) times daily.     guaiFENesin 600 MG 12 hr tablet  Commonly known as:  MUCINEX  Take 1 tablet (600 mg total) by mouth 2 (two) times daily.     hydrOXYzine 25 MG tablet  Commonly known as:  ATARAX/VISTARIL  Take 25 mg by  mouth every 6 (six) hours as needed for itching.     metFORMIN 500 MG tablet  Commonly known as:  GLUCOPHAGE  Take 500 mg by mouth 2 (two) times daily with a meal.     multivitamin with minerals tablet  Take 1 tablet by mouth daily.     nitroGLYCERIN 0.4 MG SL tablet  Commonly known as:  NITROSTAT  Place 1 tablet (0.4 mg total) under the tongue every 5 (five) minutes as needed for chest pain.     phenol 1.4 % Liqd  Commonly known as:  CHLORASEPTIC  Use as directed 2 sprays in the mouth or throat every 2 (two) hours as needed for throat irritation / pain.     polyethylene glycol packet  Commonly known as:  MIRALAX / GLYCOLAX  Take 17 g by mouth daily as needed for moderate constipation or severe constipation.     pravastatin 40 MG tablet  Commonly known as:  PRAVACHOL  Take 1 tablet (40 mg total) by mouth every evening.     promethazine 25 MG tablet  Commonly known as:  PHENERGAN  Take 25 mg by mouth every 6 (six) hours as needed. n/v     spironolactone 25 MG tablet  Commonly known as:  ALDACTONE  Take 0.5 tablets (12.5 mg total) by mouth daily.     traMADol 50 MG tablet  Commonly known as:  ULTRAM  Take 1 tablet (50 mg total) by mouth every 12 (twelve) hours as needed for moderate pain.     UNKNOWN TO PATIENT  Apply 1 application topically daily as needed (bed sore.). "Medication comparable to nystatin cream but cheaper" for bed sores.     vitamin B-12 500 MCG tablet  Commonly known as:  CYANOCOBALAMIN  Take 1,000 mcg by mouth daily.       Allergies  Allergen Reactions  . Losartan Other (See Comments)    Hyperkalemia > 7  . Azithromycin Itching  . Contrast Media [Iodinated Diagnostic Agents] Hives, Itching and Rash    Red/yellow  . Doxycycline Itching       . Keflex [Cephalexin] Itching  . Macrodantin Itching  . Minocycline Hcl Itching  . Nitrofurantoin Itching  . Zithromax [Azithromycin Dihydrate] Itching   Follow-up Information    Follow up with  Donnajean Lopes, MD. Schedule an appointment as soon as possible for a visit in 1 week.   Specialty:  Internal Medicine   Why:  Hospital follow-up   Contact information:   [address] [employer] [place] [address] [phone]        The results of significant  diagnostics from this hospitalization (including imaging, microbiology, ancillary and laboratory) are listed below for reference.    Significant Diagnostic Studies: Dg Chest 2 View  05/26/2016  CLINICAL DATA:  Generalized weakness.  Decreased blood pressure. EXAM: CHEST  2 VIEW COMPARISON:  04/08/2016 FINDINGS: There changes from aortic valve replacement, stable. There stable changes from CABG surgery. Cardiac silhouette is mildly enlarged. No mediastinal or hilar masses or adenopathy are noted. Clear lungs.  No pleural effusion or pneumothorax. Bony thorax is demineralized but grossly intact. IMPRESSION: No acute cardiopulmonary disease. Electronically Signed   By: Lajean Manes M.D.   On: 05/26/2016 18:24    Microbiology: No results found for this or any previous visit (from the past 240 hour(s)).   Labs: Basic Metabolic Panel:  Recent Labs Lab 05/26/16 1724 05/27/16 0351 05/28/16 0359  NA 137 138 140  K 4.4 4.6 4.7  CL 96* 102 104  CO2 30 27 29   GLUCOSE 157* 122* 133*  BUN 62* 55* 56*  CREATININE 1.46* 1.17 1.15  CALCIUM 9.8 9.3 9.3  MG  --  1.5*  --    Liver Function Tests:  Recent Labs Lab 05/26/16 1724  AST 34  ALT 25  ALKPHOS 196*  BILITOT 0.8  PROT 6.3*  ALBUMIN 3.7   No results for input(s): LIPASE, AMYLASE in the last 168 hours. No results for input(s): AMMONIA in the last 168 hours. CBC:  Recent Labs Lab 05/26/16 1724 05/27/16 0351 05/28/16 0359  WBC 8.2 7.7 5.1  HGB 10.4* 9.7* 9.3*  HCT 31.6* 29.8* 29.0*  MCV 87.8 89.8 90.9  PLT 66* 78* 63*   Cardiac Enzymes:  Recent Labs Lab 05/26/16 2319 05/27/16 0146 05/27/16 0351  TROPONINI <0.03 <0.03 <0.03   BNP: BNP (last 3  results)  Recent Labs  03/12/16 1856 04/08/16 1619  BNP 2301.3* 3365.9*    ProBNP (last 3 results) No results for input(s): PROBNP in the last 8760 hours.  CBG:  Recent Labs Lab 05/27/16 1156 05/27/16 1650 05/27/16 2240 05/28/16 0733 05/28/16 1121  GLUCAP 178* 136* 146* 149* 161*       Signed:  Vlada Uriostegui  [facility] 05/28/2016, 11:52 AM

## 2016-05-28 NOTE — Care Management Note (Signed)
Case Management Note  Patient Details  Name: [name] MRN: [id number] Date of Birth: 12/11/1926  Subjective/Objective:                    Action/Plan:d/c SNF.   Expected Discharge Date:   (unknown)               Expected Discharge Plan:  Skilled Nursing Facility  In-House Referral:  Clinical Social Work  Discharge planning Services  CM Consult  Post Acute Care Choice:  Home Health (Active with [facility]) Choice offered to:     DME Arranged:    DME Agency:     HH Arranged:    [place] Agency:     Status of Service:  Completed, signed off  If discussed at [employer], dates discussed:    Additional Comments:  Dessa Phi, RN 05/28/2016, 11:57 AM

## 2016-05-28 NOTE — Discharge Instructions (Signed)
Hypotension  As your heart beats, it forces blood through your arteries. This force is your blood pressure. If your blood pressure is too low for you to go about your normal activities or to support the organs of your body, you have hypotension. Hypotension is also referred to as low blood pressure. When your blood pressure becomes too low, you may not get enough blood to your brain. As a result, you may feel weak, feel lightheaded, or develop a rapid heart rate. In a more severe case, you may faint.  CAUSES  Various conditions can cause hypotension. These include:  · Blood loss.  · Dehydration.  · Heart or endocrine problems.  · Pregnancy.  · Severe infection.  · Not having a well-balanced diet filled with needed nutrients.  · Severe allergic reactions (anaphylaxis).  Some medicines, such as blood pressure medicine or water pills (diuretics), may lower your blood pressure below normal. Sometimes taking too much medicine or taking medicine not as directed can cause hypotension.  TREATMENT   Hospitalization is sometimes required for hypotension if fluid or blood replacement is needed, if time is needed for medicines to wear off, or if further monitoring is needed. Treatment might include changing your diet, changing your medicines (including medicines aimed at raising your blood pressure), and use of support stockings.  HOME CARE INSTRUCTIONS   · Drink enough fluids to keep your urine clear or pale yellow.  · Take your medicines as directed by your health care provider.  · Get up slowly from reclining or sitting positions. This gives your blood pressure a chance to adjust.  · Wear support stockings as directed by your health care provider.  · Maintain a healthy diet by including nutritious food, such as fruits, vegetables, nuts, whole grains, and lean meats.  SEEK MEDICAL CARE IF:  · You have vomiting or diarrhea.  · You have a fever for more than 2-3 days.  · You feel more thirsty than usual.  · You feel weak and  tired.  SEEK IMMEDIATE MEDICAL CARE IF:   · You have chest pain or a fast or irregular heartbeat.  · You have a loss of feeling in some part of your body, or you lose movement in your arms or legs.  · You have trouble speaking.  · You become sweaty or feel lightheaded.  · You faint.  MAKE SURE YOU:   · Understand these instructions.  · Will watch your condition.  · Will get help right away if you are not doing well or get worse.     This information is not intended to replace advice given to you by your health care provider. Make sure you discuss any questions you have with your health care provider.     Document Released: 11/22/2005 Document Revised: 09/12/2013 Document Reviewed: 05/25/2013  Elsevier Interactive Patient Education ©2016 Elsevier Inc.

## 2016-05-28 NOTE — Clinical Social Work Placement (Signed)
Patient is set to discharge to [facility] today. Patient & daughter, [name] made aware. Discharge packet given to RN, Catie. [facility] called for transport.     Raynaldo Opitz, [facility] Clinical Social Worker cell #: [phone]    CLINICAL SOCIAL WORK PLACEMENT  NOTE  Date:  05/28/2016  Patient Details  Name: [name] MRN: [id number] Date of Birth: Jan 06, 1927  Clinical Social Work is seeking post-discharge placement for this patient at the [place] level of care (*CSW will initial, date and re-position this form in  chart as items are completed):  Yes   Patient/family provided with [facility]'s list of facilities offering this level of care within the geographic area requested by the patient (or if unable, by the patient's family).  Yes   Patient/family informed of their freedom to choose among providers that offer the needed level of care, that participate in Medicare, Medicaid or managed care program needed by the patient, have an available bed and are willing to accept the patient.  Yes   Patient/family informed of [facility]'s ownership interest in [facility] and [facility], as well as of the fact that they are under no obligation to receive care at these facilities.  PASRR submitted to EDS on       PASRR number received on       Existing PASRR number confirmed on 05/28/16     FL2 transmitted to all facilities in geographic area requested by pt/family on 05/28/16     FL2 transmitted to all facilities within larger geographic area on       Patient informed that his/her managed care company has contracts with or will negotiate with certain facilities, including the following:        Yes   Patient/family informed of bed offers received.  Patient chooses bed at [place] [place]     Physician recommends and patient chooses bed at      Patient to be  transferred to [place], [place] on 05/28/16.  Patient to be transferred to facility by [facility]     Patient family notified on 05/28/16 of transfer.  Name of family member notified:  patient's daughter, [name] via phone     PHYSICIAN       Additional Comment:    _______________________________________________ Standley Brooking, LCSW 05/28/2016, 12:02 PM

## 2016-05-28 NOTE — Progress Notes (Signed)
OT Cancellation Note  Patient Details Name: [name] MRN: [id number] DOB: Jun 07, 1927   Cancelled Treatment:    Reason Eval/Treat Not Completed: Other (comment).  Noted, pt is planning discharge to SNF today. Will defer OT to next venue.  Emeree Mahler 05/28/2016, 1:07 PM  Lesle Chris, OTR/L [phone] 05/28/2016

## 2016-05-28 NOTE — Progress Notes (Signed)
Physical Therapy Treatment Patient Details Name: [name] MRN: [id number] DOB: 05-26-1927 Today's Date: 05/28/2016    History of Present Illness 82 yo male admitted with dehydration, hypotension. Hx of CAD, CABG, PAF, aortic stenosis, HTN, lymphoma, L foot transmet amputation. Recent stay at  [facility] for rehab.     PT Comments    Progressing slowly with mobility. Pt appears confused on today. He was able to walk ~40 feet with Min assist on today. No family present during session. Unsure if wife is able to safely manage with him at this time. Continue to recommend ST rehab at SNF, if pt is agreeable.   Follow Up Recommendations  SNF     Equipment Recommendations  Rolling walker with 5" wheels (it pt doesn't have already)    Recommendations for Other Services       Precautions / Restrictions Precautions Precautions: Fall Restrictions Weight Bearing Restrictions: No    Mobility  Bed Mobility Overal bed mobility: Needs Assistance Bed Mobility: Supine to Sit     Supine to sit: Supervision     General bed mobility comments: for safety  Transfers Overall transfer level: Needs assistance Equipment used: Rolling walker (2 wheeled) Transfers: Sit to/from Stand Sit to Stand: Min assist         General transfer comment: Assist to rise, stabilize, control descent. VCs safety, hand placement  Ambulation/Gait Ambulation/Gait assistance: Min assist Ambulation Distance (Feet): 40 Feet Assistive device: Rolling walker (2 wheeled) Gait Pattern/deviations: Step-through pattern;Decreased stride length     General Gait Details: Assist to stabilize. Improved stability with RW use. slow gait speed. Pt fatigues easily. Dyspnea 2/4.    Stairs            Wheelchair Mobility    Modified Rankin (Stroke Patients Only)       Balance Overall balance assessment: Needs assistance         Standing balance support: Bilateral upper extremity supported;During functional  activity Standing balance-Leahy Scale: Poor                      Cognition Arousal/Alertness: Awake/alert Behavior During Therapy: WFL for tasks assessed/performed Overall Cognitive Status: No family/caregiver present to determine baseline cognitive functioning Area of Impairment: Orientation;Memory Orientation Level: Situation;Place;Disoriented to   Memory: Decreased short-term memory              Exercises      General Comments        Pertinent Vitals/Pain Pain Assessment: No/denies pain    Home Living                      Prior Function            PT Goals (current goals can now be found in the care plan section) Progress towards PT goals: Progressing toward goals (slowly)    Frequency  Min 3X/week    PT Plan Current plan remains appropriate    Co-evaluation             End of Session Equipment Utilized During Treatment: Gait belt Activity Tolerance: Patient tolerated treatment well Patient left: in chair;with call bell/phone within reach;with chair alarm set     Time: [id number] PT Time Calculation (min) (ACUTE ONLY): 19 min  Charges:  $Gait Training: 8-22 mins                    G Codes:      Weston Anna, MPT Pager: [phone]

## 2016-05-28 NOTE — Progress Notes (Signed)
Report called to [facility] at [place]. Answered all questions. Pt will be transported by [facility]. Family and patient aware.

## 2016-06-14 ENCOUNTER — Encounter: Payer: Self-pay | Admitting: Physician Assistant

## 2016-06-14 ENCOUNTER — Encounter (INDEPENDENT_AMBULATORY_CARE_PROVIDER_SITE_OTHER): Payer: Self-pay

## 2016-06-14 ENCOUNTER — Ambulatory Visit (INDEPENDENT_AMBULATORY_CARE_PROVIDER_SITE_OTHER): Payer: Medicare Other | Admitting: Physician Assistant

## 2016-06-14 VITALS — BP 130/40 | HR 74 | Ht 72.0 in | Wt 160.8 lb

## 2016-06-14 DIAGNOSIS — Z954 Presence of other heart-valve replacement: Secondary | ICD-10-CM | POA: Diagnosis not present

## 2016-06-14 DIAGNOSIS — I5022 Chronic systolic (congestive) heart failure: Secondary | ICD-10-CM | POA: Diagnosis not present

## 2016-06-14 DIAGNOSIS — I4819 Other persistent atrial fibrillation: Secondary | ICD-10-CM

## 2016-06-14 DIAGNOSIS — I481 Persistent atrial fibrillation: Secondary | ICD-10-CM | POA: Diagnosis not present

## 2016-06-14 DIAGNOSIS — I10 Essential (primary) hypertension: Secondary | ICD-10-CM | POA: Diagnosis not present

## 2016-06-14 DIAGNOSIS — Z952 Presence of prosthetic heart valve: Secondary | ICD-10-CM

## 2016-06-14 DIAGNOSIS — I25810 Atherosclerosis of coronary artery bypass graft(s) without angina pectoris: Secondary | ICD-10-CM

## 2016-06-14 LAB — BASIC METABOLIC PANEL
BUN: 28 mg/dL — ABNORMAL HIGH (ref 7–25)
CHLORIDE: 98 mmol/L (ref 98–110)
CO2: 25 mmol/L (ref 20–31)
Calcium: 8.9 mg/dL (ref 8.6–10.3)
Creat: 0.87 mg/dL (ref 0.70–1.11)
Glucose, Bld: 174 mg/dL — ABNORMAL HIGH (ref 65–99)
POTASSIUM: 4.1 mmol/L (ref 3.5–5.3)
SODIUM: 134 mmol/L — AB (ref 135–146)

## 2016-06-14 MED ORDER — METOPROLOL SUCCINATE ER 25 MG PO TB24: 25.0000 mg | ORAL_TABLET | Freq: Every day | ORAL | Status: AC

## 2016-06-14 NOTE — Progress Notes (Signed)
Cardiology Office Note:    Date:  06/14/2016   ID:  [name], DOB [birth year], MRN [id number]  PCP:  Richard Lopes, MD  Cardiologist:  Dr. Sherren [name]   Electrophysiologist:  N/a Oncologist: Dr. Alvy [name]  Referring MD: Richard Battles, MD   Chief Complaint  Patient presents with  . Hospitalization Follow-up    admx with hypotension  . Congestive Heart Failure    follow up    History of Present Illness:     [name] is a 82 y.o. male with a hx of CAD s/p CABG, aortic stenosis, carotid artery disease, HTN, HL, PVD, COPD, chronic DVT, PAF, Mantle Cell Lymphoma.  He underwent TAVR in 12/15 for severe, symptomatic AS.  Prior to TAVR, he underwent PCI with DES to the S-PDA b/c of critical in-stent restenosis in that graft.  He was admitted in 5/17 with enterococcal sepsis.  He was treated with 6 weeks of empiric antibiotics to cover for SBE with hx of TAVR.  FU echo demonstrated worsening LVF with EF 25-30%.  Troponin levels were elevated.  However, given multiple comorbid conditions, conservative management was pursued. He was made DNR. ACE/ARB was avoided due to prior hx of hyperkalemia.  Of note, he was in AF with RVR during his hospital stay.  He was seen by Dr. Sherren [name] in 5/17 after DC and diuretics were adjusted due to volume excess.  He was not felt to be a candidate for anticoagulation given hx of thrombocytopenia and frailty.  He was last seen 05/14/16.  His volume was improved. Metolazone was stopped.   He was then admitted 6/21-6/23 with generalized weakness and hypotension. He was given IV fluids. Troponin levels were negative. Furosemide and potassium were held secondary to worsening creatinine and hypotension. At discharge, furosemide was resumed at 20 mg twice a day (previous dose 80 mg twice a day).   Returns for FU.  He is now living at [place].  Here with his wife. He has an indwelling Foley 2/2 urinary retention.  He tells me that he is doing fairly  well. He denies any worsening dyspnea.  He denies chest pain or syncope.  He denies orthopnea, PND.  His LE edema has increased a little but he denies any significant change.  He denies significant cough.  He denies any bleeding issues.   Past Medical History  Diagnosis Date  . CAD (coronary artery disease)     a. s/p CABG 1995;  b. NSTEMI & subsequent BMS to SVG->right PDA 02/03/12.;  c. Cath 02/14/2012 3vd with 4/4 patent grafts and patent stent in vg->pda;  d. 10/2014 PCI/DES to the VG->PDA.  . Carotid stenosis     a. Carotid dopplers [id number]: RICA [id number], LICA [id number];  b. dopplers 3/14:  123456 RICA, [id number] LICA  . HTN (hypertension)   . HLD (hyperlipidemia)   . Asthmatic bronchitis   . BPH (benign prostatic hypertrophy)   . Herpes zoster   . Anemia   . Thrombocytopenia ([place])     a. secondary to splenomegaly related to lymphoma with suspicion of bone marrow involvement. (Plavix had to be stopped due to this)  . Aortic stenosis, severe     a. ECHO 07/22/14 Severe AS. Peak and mean gradients of 57 mmHg and 42 mmHg, respectively. Calculated AVA is 0.8-0.9 cm2. Trivial AR. Valve area (VTI): 0.84 cm^2. Valve area (Vmax): 0.86 cm^2.. Aortic root dimension: 42 mm (ED) and aortic root mildly dilated;  b. 10/2014 S/P TAVR w/ Oletta Lamas  Sapien 3 THV 54mm;  c. 10/2014 Echo: EF 50%, nl fxning AoV, mean grad of 9, peak of 16.  Marland Kitchen Splenomegaly   . COPD (chronic obstructive pulmonary disease) ([place])   . Peripheral vascular disease ([place])     a. s/p L ray amp 10/13;  b. s/p L 2nd toe amp 12/13  . Pulmonary nodule     a. 79mm RUL calcified pulm nodule noted on CT staging for lymphoma - stable 10/2012.  . Leg DVT (deep venous thromboembolism), chronic ([place])     a. LE dopplers 5/13, 10/13 and 1/14: chronic DVT involving right mid femoral vein, left mid femoral vein, and left popliteal vein.  . Osteomyelitis ([place])     a. L first foot ray amputation 09/2012  . Heart murmur   . Anginal pain ([place])   . Myocardial  infarction [facility]) ?1995  . Pneumonia ?2014  . DM2 (diabetes mellitus, type 2) ([place])   . DJD (degenerative joint disease)   . 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 Stage III s/p bendamustine, Rituxan therapy  . 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 29 mm Edwards Sapien 3 transcatheter heart valve placed via open right transfemoral approach    Past Surgical History  Procedure Laterality Date  . Coronary artery bypass graft  1995  . Shoulder arthroscopy w/ rotator cuff repair Right   . Carpal tunnel release Bilateral   . Incisional hernia repair    . Kidney surgery      "cut me 1/2 in 2 for stones"  . Back surgery    . I&d extremity  09/16/2012    Procedure: IRRIGATION AND DEBRIDEMENT EXTREMITY;  Surgeon: Wylene Simmer, MD;  Location: [facility];  Service: Orthopedics;  Laterality: Left;  irrigation and debridement and first Ray amputation of left foot  . Amputation  09/16/2012    Procedure: AMPUTATION RAY;  Surgeon: Wylene Simmer, MD;  Location: [facility];  Service: Orthopedics;  Laterality: Left;  first Ray amputation left foot  . Amputation  11/21/2012    Procedure: AMPUTATION RAY;  Surgeon: Wylene Simmer, MD;  Location: [place];  Service: Orthopedics;  Laterality: Left;  LEFT SECOND TOE AMPUTATION  . I&d extremity Left 03/01/2013    Procedure: IRRIGATION AND DEBRIDEMENT EXTREMITY;  Surgeon: Wylene Simmer, MD;  Location: [facility];  Service: Orthopedics;  Laterality: Left;  IRRIGATION  AND  DEBRIDEMENT  LEFT  FOOT  . Amputation Left 12/20/2013    Procedure: AMPUTATION LEFT 5TH TRANSMETATARSAL;  Surgeon: Wylene Simmer, MD;  Location: [place];  Service: Orthopedics;  Laterality: Left;  . Achilles tendon lengthening Left 12/20/2013    Procedure: ACHILLES TENDON LENGTHENING;  Surgeon: Wylene Simmer, MD;  Location: [place];  Service: Orthopedics;  Laterality: Left;  . Hernia repair    . Coronary angioplasty with stent placement  09/2014; 10/14/2014    "?2; 1"  .  Cataract extraction w/ intraocular lens  implant, bilateral Bilateral   . Transcatheter aortic valve replacement, transfemoral N/A 11/05/2014    Procedure: TRANSCATHETER AORTIC VALVE REPLACEMENT, TRANSFEMORAL;  Surgeon: Richard Mocha, MD;  Location: [facility] OR; Berniece Pap 3 THV (size 29 mm, model # [id number], serial # [id number])   . Intraoperative transesophageal echocardiogram N/A 11/05/2014    Procedure: INTRAOPERATIVE TRANSESOPHAGEAL ECHOCARDIOGRAM;  Surgeon: Richard Mocha, MD;  Location[facility] OR;  Service: Open Heart Surgery;  Laterality: N/A;  . Left and right heart catheterization with coronary/graft angiogram N/A 02/03/2012  Procedure: LEFT AND RIGHT HEART CATHETERIZATION WITH Beatrix Fetters;  Surgeon: Richard Mocha, MD;  Location: [facility] CATH LAB;  Service: Cardiovascular;  Laterality: N/A;  . Percutaneous stent intervention  02/03/2012    Procedure: PERCUTANEOUS STENT INTERVENTION;  Surgeon: Richard Mocha, MD;  Location: [facility] CATH LAB;  Service: Cardiovascular;;  . Left heart catheterization with coronary/graft angiogram N/A 02/14/2012    Procedure: LEFT HEART CATHETERIZATION WITH Beatrix Fetters;  Surgeon: Peter M Martinique, MD;  Location: [facility] CATH LAB;  Service: Cardiovascular;  Laterality: N/A;  . Left and right heart catheterization with coronary/graft angiogram N/A 09/23/2014    Procedure: LEFT AND RIGHT HEART CATHETERIZATION WITH Beatrix Fetters;  Surgeon: Blane Ohara, MD;  Location: [facility] CATH LAB;  Service: Cardiovascular;  Laterality: N/A;  . Percutaneous coronary stent intervention (pci-s)  10/14/2014    Procedure: PERCUTANEOUS CORONARY STENT INTERVENTION (PCI-S);  Surgeon: Blane Ohara, MD;  Location: [facility] CATH LAB;  Service: Cardiovascular;;    Current Medications: Outpatient Prescriptions Prior to Visit  Medication Sig Dispense Refill  . ANORO ELLIPTA 62.5-25 MCG/INH AEPB Inhale 1 puff into the lungs daily.     Marland Kitchen aspirin EC 81 MG tablet Take 81 mg by mouth  every morning.     . clopidogrel (PLAVIX) 75 MG tablet TAKE ONE TABLET BY MOUTH ONCE DAILY 90 tablet 3  . docusate sodium (COLACE) 100 MG capsule Take 100 mg by mouth daily as needed for moderate constipation.     . ENSURE (ENSURE) Take 237 mLs by mouth 3 (three) times daily as needed (for poor appetite).    . feeding supplement, ENSURE ENLIVE, (ENSURE ENLIVE) LIQD Take 237 mLs by mouth 2 (two) times daily between meals. 237 mL 12  . ferrous sulfate 325 (65 FE) MG tablet Take 325 mg by mouth daily with breakfast.    . furosemide (LASIX) 20 MG tablet Take 1 tablet (20 mg total) by mouth 2 (two) times daily. 60 tablet 0  . gabapentin (NEURONTIN) 300 MG capsule Take 300 mg by mouth 2 (two) times daily.     . hydrOXYzine (ATARAX/VISTARIL) 25 MG tablet Take 25 mg by mouth every 6 (six) hours as needed for itching.    . metFORMIN (GLUCOPHAGE) 500 MG tablet Take 500 mg by mouth 2 (two) times daily with a meal.     . Multiple Vitamins-Minerals (MULTIVITAMIN WITH MINERALS) tablet Take 1 tablet by mouth daily.    . nitroGLYCERIN (NITROSTAT) 0.4 MG SL tablet Place 1 tablet (0.4 mg total) under the tongue every 5 (five) minutes as needed for chest pain. 25 tablet 1  . phenol (CHLORASEPTIC) 1.4 % LIQD Use as directed 2 sprays in the mouth or throat every 2 (two) hours as needed for throat irritation / pain.    . polyethylene glycol (MIRALAX / GLYCOLAX) packet Take 17 g by mouth daily as needed for moderate constipation or severe constipation.    . pravastatin (PRAVACHOL) 40 MG tablet Take 1 tablet (40 mg total) by mouth every evening. 90 tablet 3  . promethazine (PHENERGAN) 25 MG tablet Take 25 mg by mouth every 6 (six) hours as needed. n/v    . spironolactone (ALDACTONE) 25 MG tablet Take 0.5 tablets (12.5 mg total) by mouth daily. 30 tablet 0  . traMADol (ULTRAM) 50 MG tablet Take 1 tablet (50 mg total) by mouth every 12 (twelve) hours as needed for moderate pain. 10 tablet 0  . UNKNOWN TO PATIENT Apply 1  application topically daily as needed (bed sore.). "Medication  comparable to nystatin cream but cheaper" for bed sores.    . vitamin B-12 (CYANOCOBALAMIN) 500 MCG tablet Take 1,000 mcg by mouth daily.     . carvedilol (COREG) 3.125 MG tablet Take 1 tablet (3.125 mg total) by mouth 2 (two) times daily with a meal. 60 tablet 0  . famotidine (PEPCID) 20 MG tablet Take 1 tablet (20 mg total) by mouth at bedtime. (Patient not taking: Reported on 06/14/2016) 30 tablet 0  . guaiFENesin (MUCINEX) 600 MG 12 hr tablet Take 1 tablet (600 mg total) by mouth 2 (two) times daily. (Patient not taking: Reported on 06/14/2016) 30 tablet 0   No facility-administered medications prior to visit.      Allergies:   Losartan; Azithromycin; Contrast media; Doxycycline; Keflex; Macrodantin; Minocycline hcl; Nitrofurantoin; and Zithromax   Social History   Social History  . Marital Status: Married    Spouse Name: N/A  . Number of Children: N/A  . Years of Education: N/A   Occupational History  . retired [employer]   Social History Main Topics  . Smoking status: Former Smoker -- 1.00 packs/day for 30 years    Types: Cigarettes, Pipe    Quit date: 03/29/1985  . Smokeless tobacco: Former Systems developer    Types: Chew     Comment: "chewed for ~ 1 yr after I quit smoking"  . Alcohol Use: No  . Drug Use: No  . Sexual Activity: Not Currently   Other Topics Concern  . None   Social History Narrative   Ad[facility]   Married   Former smoker-stopped 1986   Alcohol none   DNR     Family History:  The patient's family history includes Other in his father and mother.   ROS:   Please see the history of present illness.    Review of Systems  Constitution: Positive for decreased appetite.  Cardiovascular: Positive for leg swelling.  Respiratory: Positive for cough.   Hematologic/Lymphatic: Bruises/bleeds easily.  Neurological: Positive for dizziness and loss of balance.   All other systems  reviewed and are negative.   Physical Exam:    VS:  BP 130/40 mmHg  Pulse 74  Ht 6' (1.829 m)  Wt 160 lb 12.8 oz (72.938 kg)  BMI 21.80 kg/m2   Physical Exam  Constitutional: He is oriented to person, place, and time. He appears well-developed. No distress.  Chronically ill-appearing male   HENT:  Head: Normocephalic and atraumatic.  Neck: No JVD present.  Cardiovascular: S1 normal and S2 normal.  An irregularly irregular rhythm present.  No murmur heard. Pulmonary/Chest: Effort normal and breath sounds normal. He has no wheezes. He has no rales.  Abdominal: Soft. There is no tenderness.  Musculoskeletal:  Trace bilateral LE edema.   Neurological: He is alert and oriented to person, place, and time.  Skin: Skin is warm and dry.  Psychiatric: He has a normal mood and affect.    Wt Readings from Last 3 Encounters:  06/14/16 160 lb 12.8 oz (72.938 kg)  05/28/16 152 lb 14.4 oz (69.355 kg)  05/21/16 159 lb 6.4 oz (72.303 kg)      Studies/Labs Reviewed:     EKG:  EKG is   ordered today.  The ekg ordered today demonstrates Atrial fibrillation, left axis deviation, septal Q waves, PVCs  Recent Labs: 04/08/2016: B Natriuretic Peptide 3365.9* 05/26/2016: ALT 25 05/27/2016: Magnesium 1.5* 05/28/2016: BUN 56*; Creatinine, Ser 1.15; Hemoglobin 9.3*; Platelets 63*; Potassium 4.7; Sodium 140  Recent Lipid Panel    Component Value Date/Time   CHOL 111 02/02/2012 0910   TRIG 76 02/02/2012 0910   HDL 24* 02/02/2012 0910   CHOLHDL 4.6 02/02/2012 0910   VLDL 15 02/02/2012 0910   LDLCALC 72 02/02/2012 0910   LDLDIRECT 66.0 02/22/2008 0928    Additional studies/ records that were reviewed today include:   Echo 04/08/16 - Left ventricle: LVEF is seveely depressed with dyskinetic septal  motion. The cavity size was normal. Wall thickness was normal.  Systolic function was severely reduced. The estimated ejection  fraction was in the range of 25% to 30%. - Aortic valve: Peak and  mean gradients through AV prosthesis are  17 and 10 mm Hg respectively. s/p TAVR. There was no  regurgitation. - Mitral valve: Moderately calcified, moderately thickened annulus.  Mildly thickened leaflets . There was mild regurgitation. - Left atrium: The atrium was severely dilated. - Right ventricle: The cavity size was moderately dilated. Wall  thickness was normal. Systolic function was moderately reduced. - Right atrium: The atrium was severely dilated. - Tricuspid valve: There was moderate regurgitation. Impressions:  Since echo from December 2016, LVEF is significantly depressed/  Carotid US 3/16 Stable 1-39% bilateral ICA stenosis. >> FU 2 years  PCI 10/14/14 3.0x20 mm Promus DES to SVG-PDA  LHC 09/23/14 Left mainstem: 100% occlusion Left anterior descending (LAD): Totally occluded from the native circulation. Fills entirely from the LIMA graft. Left circumflex (LCx): Totally occluded from the native circulation. Fills entirely from the sequence graft to OM1 and OM 2 Right coronary artery (RCA): Severely calcified. The vessel has long segment 70% stenosis in its midportion. It is 100% occluded in the distal portion. Saphenous vein graft to PDA: The ostium and proximal portion of the graft is stented. The stented segment has sequential 95% stenoses. The remaining portions of the bypass graft are patent with mild diffuse disease noted in the distal body of the vein graft. The PDA insertion site appears patent. The PLA and distal RCA filled retrograde from graft flow. Saphenous vein graft sequential to OM1 and OM 2: Widely patent throughout. There are 3 major OM branches filling from this graft. LIMA to LAD: Widely patent throughout. The distal anastomosis is widely patent. The proximal LAD fills retrograde. Left ventriculography: LV function appears low normal, with an LVEF estimated at 50-55%. The mitral annulus is severely calcified. The aortic valve is severely  calcified. Final Conclusions:   1. Severe native three-vessel disease with total occlusion of the RCA and total occlusion of the left main 2. Status post aortocoronary bypass surgery with continued patency of the LIMA to LAD and sequential saphenous vein graft to OM1 and OM 2 3. Severe in-stent restenosis in the saphenous vein graft to PDA 4. Low normal LV systolic function with normal right heart cardiac filling pressures 5. Moderate to severe aortic stenosis Recommendations: The patient's aortic stenosis is severe by echo criteria. His valve is heavily calcified with restricted opening of all leaflets. He also has high-grade obstructive disease in the stented segment of the RCA graft. Will refer to Dr. Cyndia Bent for consideration of further treatment options, which includes TAVR/PCI versus redo CABG/AVR   ASSESSMENT:     1. Chronic systolic CHF (congestive heart failure) ([place])   2. S/P TAVR (transcatheter aortic valve replacement)   3. Persistent atrial fibrillation ([place])   4. Essential hypertension   5. Coronary artery disease involving coronary bypass graft of native heart without angina pectoris  PLAN:     In order of problems listed above:  1. Chronic systolic CHF - He had noted reduced LVEF with normal TAVR function on echo in 5/17 during admit for sepsis.  His Troponin levels were elevated and he was noted to be in AF with RVR at times.  Given his comorbid conditions and DNR status, he was managed conservatively.  He is now s/p admit for hypotension.  He is on lower dose medications with Lasix 20 bid, Spiro 12.5 QD.  His Carvedilol was changed to Metoprolol Succ 12.5. QD.  He is not on ACE/ARB due to hx of hyperkalemia.  He is fairly weak from his multiple medical problems and is fairly sedentary. But, he denies significant dyspnea.  His leg edema is a little increased.  I do not have accurate weights.  His weight in our office is down 9 lbs since last visit.   -  BMET, BNP today.   Adjust Lasix if BNP significantly increased  -  Continue current dose of Spiro.    -  Increase Metoprolol Succ to 25 mg QD  -  FU with me or Dr. Sherren [name] in 6 weeks.  -  Consider repeat Echo at next visit if overall status improved.  2. S/p TAVR - Recent Echo with stable valve function.  Continue SBE prophylaxis.  Continue ASA, Plavix.   3. Persistent AF - Rate is controlled.  He is not a candidate for anticoagulation due to thrombocytopenia, fall risk and frailty.   4. HTN - BP is now controlled.   5. CAD - He is s/p CABG and recent PCI with DES to S-PDA in 2015 prior to TAVR.  Continue ASA, Plavix, statin.   Medication Adjustments/Labs and Tests Ordered: Current medicines are reviewed at length with the patient today.  Concerns regarding medicines are outlined above.  Medication changes, Labs and Tests ordered today are outlined in the Patient Instructions noted below. Patient Instructions  Medication Instructions:  1. INCREASE TOPROL XL TO 25 MG DAILY Labwork: TODAY BMET, BNP Testing/Procedures: NONE Follow-Up: 07/26/16  @ 11:45 WITH Roshon Duell, PAC Any Other Special Instructions Will Be Listed Below (If Applicable). If you need a refill on your cardiac medications before your next appointment, please call your pharmacy.   Signed, Richardson Dopp, PA-C  06/14/2016 2:55 PM    [address], [place], [place]  [address] Phone: [phone]; Fax: [phone]

## 2016-06-14 NOTE — Patient Instructions (Addendum)
Medication Instructions:  1. INCREASE TOPROL XL TO 25 MG DAILY Labwork: TODAY BMET, BNP Testing/Procedures: NONE Follow-Up: 07/26/16  @ 11:45 WITH SCOTT WEAVER, PAC Any Other Special Instructions Will Be Listed Below (If Applicable). If you need a refill on your cardiac medications before your next appointment, please call your pharmacy.

## 2016-06-15 ENCOUNTER — Telehealth: Payer: Self-pay | Admitting: *Deleted

## 2016-06-15 DIAGNOSIS — I5023 Acute on chronic systolic (congestive) heart failure: Secondary | ICD-10-CM

## 2016-06-15 LAB — BRAIN NATRIURETIC PEPTIDE: Brain Natriuretic Peptide: 865.7 pg/mL — ABNORMAL HIGH (ref <100)

## 2016-06-15 MED ORDER — FUROSEMIDE 20 MG PO TABS: 20.0000 mg | ORAL_TABLET | ORAL | Status: DC

## 2016-06-15 NOTE — Telephone Encounter (Signed)
DPR for pt's wife. She states pt is in [facility]. I apologized for my call and I will call SNF. Wife said thank you and no problem. S/w Inez Catalina, RN at SNF who has been notified of pt's lab results. Advised of  recommendations to increase lasix 40 mg in the AM and 20 mg in the PM with BMET to be done in 1 week at SNF w/results to be faxed to [facility], [place].

## 2016-06-30 ENCOUNTER — Ambulatory Visit (INDEPENDENT_AMBULATORY_CARE_PROVIDER_SITE_OTHER): Payer: Medicare Other | Admitting: Infectious Disease

## 2016-06-30 ENCOUNTER — Encounter: Payer: Self-pay | Admitting: Infectious Disease

## 2016-06-30 ENCOUNTER — Telehealth: Payer: Self-pay | Admitting: *Deleted

## 2016-06-30 VITALS — Temp 97.8°F | Ht 72.0 in | Wt 166.8 lb

## 2016-06-30 DIAGNOSIS — C8310 Mantle cell lymphoma, unspecified site: Secondary | ICD-10-CM | POA: Diagnosis not present

## 2016-06-30 DIAGNOSIS — T826XXD Infection and inflammatory reaction due to cardiac valve prosthesis, subsequent encounter: Secondary | ICD-10-CM

## 2016-06-30 DIAGNOSIS — A4181 Sepsis due to Enterococcus: Secondary | ICD-10-CM

## 2016-06-30 DIAGNOSIS — Z952 Presence of prosthetic heart valve: Secondary | ICD-10-CM

## 2016-06-30 DIAGNOSIS — I38 Endocarditis, valve unspecified: Secondary | ICD-10-CM

## 2016-06-30 DIAGNOSIS — I480 Paroxysmal atrial fibrillation: Secondary | ICD-10-CM

## 2016-06-30 DIAGNOSIS — Z954 Presence of other heart-valve replacement: Secondary | ICD-10-CM | POA: Diagnosis not present

## 2016-06-30 DIAGNOSIS — I25810 Atherosclerosis of coronary artery bypass graft(s) without angina pectoris: Secondary | ICD-10-CM | POA: Diagnosis not present

## 2016-06-30 DIAGNOSIS — D696 Thrombocytopenia, unspecified: Secondary | ICD-10-CM | POA: Diagnosis not present

## 2016-06-30 LAB — CBC WITH DIFFERENTIAL/PLATELET
BASOS ABS: 0 {cells}/uL (ref 0–200)
Basophils Relative: 0 %
EOS PCT: 1 %
Eosinophils Absolute: 69 cells/uL (ref 15–500)
HCT: 31.6 % — ABNORMAL LOW (ref 38.5–50.0)
Hemoglobin: 10.1 g/dL — ABNORMAL LOW (ref 13.2–17.1)
LYMPHS PCT: 14 %
Lymphs Abs: 966 cells/uL (ref 850–3900)
MCH: 30.4 pg (ref 27.0–33.0)
MCHC: 32 g/dL (ref 32.0–36.0)
MCV: 95.2 fL (ref 80.0–100.0)
MONOS PCT: 10 %
MPV: 9.2 fL (ref 7.5–12.5)
Monocytes Absolute: 690 cells/uL (ref 200–950)
NEUTROS PCT: 75 %
Neutro Abs: 5175 cells/uL (ref 1500–7800)
PLATELETS: 63 10*3/uL — AB (ref 140–400)
RBC: 3.32 MIL/uL — ABNORMAL LOW (ref 4.20–5.80)
RDW: 18.2 % — ABNORMAL HIGH (ref 11.0–15.0)
WBC: 6.9 10*3/uL (ref 3.8–10.8)

## 2016-06-30 LAB — COMPREHENSIVE METABOLIC PANEL
ALK PHOS: 246 U/L — AB (ref 40–115)
ALT: 18 U/L (ref 9–46)
AST: 22 U/L (ref 10–35)
Albumin: 3.9 g/dL (ref 3.6–5.1)
BILIRUBIN TOTAL: 0.8 mg/dL (ref 0.2–1.2)
BUN: 40 mg/dL — AB (ref 7–25)
CALCIUM: 9.6 mg/dL (ref 8.6–10.3)
CO2: 25 mmol/L (ref 20–31)
Chloride: 97 mmol/L — ABNORMAL LOW (ref 98–110)
Creat: 1.21 mg/dL — ABNORMAL HIGH (ref 0.70–1.11)
GLUCOSE: 145 mg/dL — AB (ref 65–99)
Potassium: 4.4 mmol/L (ref 3.5–5.3)
SODIUM: 137 mmol/L (ref 135–146)
Total Protein: 5.5 g/dL — ABNORMAL LOW (ref 6.1–8.1)

## 2016-06-30 NOTE — Addendum Note (Signed)
Addended by: Lorne Skeens D on: 06/30/2016 02:51 PM   Modules accepted: Orders

## 2016-06-30 NOTE — Addendum Note (Signed)
Addended by: Dolan Amen D on: 06/30/2016 03:08 PM   Modules accepted: Orders

## 2016-06-30 NOTE — Progress Notes (Signed)
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   MRN: [id number]  HPI  82 y.o. male with hx of prior treated mantle cell lymphoma, chronic ttpenia, Prosthetic heart valve and Enterococcal bacteremia, and septic shock. We performed a transthoracic echocardiogram which was negative for heart valve vegetations but did not pursue a transesophageal echocardiogram was more prudent to treat him empirically for prosthetic valve endocarditis and he was treated with high-dose ampicillin along with high-dose ceftriaxone due to lack of gentamicin synergy. He completed 6 weeks of therapy. In the interim he has been diagnosed with mantle cell lymphoma is being followed by Dr. Alvy Bimler with oncology. He was not felt to be a good candidate for chemotherapy at last visit but is due for another visit tomorrow. He is feeling well and much better than when I last saw him. I would like to check some blood cultures to ensure that he is cured his bloodstream infection.  Past Medical History:  Diagnosis Date  . Anemia   . Anginal pain ([place])   . Aortic stenosis, severe    a. ECHO 07/22/14 Severe AS. Peak and mean gradients of 57 mmHg and 42 mmHg, respectively. Calculated AVA is 0.8-0.9 cm2. Trivial AR. Valve area (VTI): 0.84 cm^2. Valve area (Vmax): 0.86 cm^2.. Aortic root dimension: 42 mm (ED) and aortic root mildly dilated;  b. 10/2014 S/P TAVR w/ Edwards Sapien 3 THV 26mm;  c. 10/2014 Echo: EF 50%, nl fxning AoV, mean grad of 9, peak of 16.  . Asthmatic bronchitis   . BPH (benign prostatic hypertrophy)   . CAD (coronary artery disease)    a. s/p CABG 1995;  b. NSTEMI & subsequent BMS to SVG->right PDA 02/03/12.;  c. Cath 02/14/2012 3vd with 4/4 patent grafts and patent stent in vg->pda;  d. 10/2014 PCI/DES to the VG->PDA.  . Carotid stenosis    a. Carotid dopplers [id number]: RICA [id number], LICA [id number];  b. dopplers 3/14:   123456 RICA, [id number] LICA  . COPD (chronic obstructive pulmonary disease) ([place])   . DJD (degenerative joint disease)   . DM2 (diabetes mellitus, type 2) ([place])   . Heart murmur   . Herpes zoster   . HLD (hyperlipidemia)   . HTN (hypertension)   . Leg DVT (deep venous thromboembolism), chronic ([place])    a. LE dopplers 5/13, 10/13 and 1/14: chronic DVT involving right mid femoral vein, left mid femoral vein, and left popliteal vein.  . Mantle cell lymphoma of intra-abdominal lymph nodes (H[facility])    a. Stage III s/p bendamustine, Rituxan therapy  . Myocardial infarction [facility]) ?1995  . Osteomyelitis ([place])    a. L first foot ray amputation 09/2012  . Peripheral vascular disease ([place])    a. s/p L ray amp 10/13;  b. s/p L 2nd toe amp 12/13  . Pneumonia ?2014  . Pulmonary nodule    a. 90mm RUL calcified pulm nodule noted on CT staging for lymphoma - stable 10/2012.  . 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 29 mm Edwards Sapien 3 transcatheter heart valve placed via open right transfemoral approach  . Splenomegaly   . Thrombocytopenia ([place])    a. secondary to splenomegaly related to lymphoma with suspicion of bone marrow involvement. (Plavix had to be stopped due to this)    Past Surgical History:  Procedure Laterality Date  . ACHILLES TENDON LENGTHENING Left 12/20/2013  Procedure: ACHILLES TENDON LENGTHENING;  Surgeon: Wylene Simmer, MD;  Location: [place];  Service: Orthopedics;  Laterality: Left;  . AMPUTATION  09/16/2012   Procedure: AMPUTATION RAY;  Surgeon: Wylene Simmer, MD;  Location: [facility];  Service: Orthopedics;  Laterality: Left;  first Ray amputation left foot  . AMPUTATION  11/21/2012   Procedure: AMPUTATION RAY;  Surgeon: Wylene Simmer, MD;  Location: [place];  Service: Orthopedics;  Laterality: Left;  LEFT SECOND TOE AMPUTATION  . AMPUTATION Left 12/20/2013   Procedure: AMPUTATION LEFT 5TH TRANSMETATARSAL;  Surgeon: Wylene Simmer, MD;  Location: [place];  Service:  Orthopedics;  Laterality: Left;  . BACK SURGERY    . CARPAL TUNNEL RELEASE Bilateral   . CATARACT EXTRACTION W/ INTRAOCULAR LENS  IMPLANT, BILATERAL Bilateral   . CORONARY ANGIOPLASTY WITH STENT PLACEMENT  09/2014; 10/14/2014   "?2; 1"  . CORONARY ARTERY BYPASS GRAFT  1995  . HERNIA REPAIR    . I&D EXTREMITY  09/16/2012   Procedure: IRRIGATION AND DEBRIDEMENT EXTREMITY;  Surgeon: Wylene Simmer, MD;  Location: [facility];  Service: Orthopedics;  Laterality: Left;  irrigation and debridement and first Ray amputation of left foot  . I&D EXTREMITY Left 03/01/2013   Procedure: IRRIGATION AND DEBRIDEMENT EXTREMITY;  Surgeon: Wylene Simmer, MD;  Location: [facility];  Service: Orthopedics;  Laterality: Left;  IRRIGATION  AND  DEBRIDEMENT  LEFT  FOOT  . INCISIONAL HERNIA REPAIR    . INTRAOPERATIVE TRANSESOPHAGEAL ECHOCARDIOGRAM N/A 11/05/2014   Procedure: INTRAOPERATIVE TRANSESOPHAGEAL ECHOCARDIOGRAM;  Surgeon: [name], MD;  [facility];  Service: Open Heart Surgery;  Laterality: N/A;  . KIDNEY SURGERY     "cut me 1/2 in 2 for stones"  . LEFT AND RIGHT HEART CATHETERIZATION WITH CORONARY/GRAFT ANGIOGRAM N/A 02/03/2012   Procedure: LEFT AND RIGHT HEART CATHETERIZATION WITH Beatrix Fetters;  Surgeon: [name], MD;  Location: [facility] CATH LAB;  Service: Cardiovascular;  Laterality: N/A;  . LEFT AND RIGHT HEART CATHETERIZATION WITH CORONARY/GRAFT ANGIOGRAM N/A 09/23/2014   Procedure: LEFT AND RIGHT HEART CATHETERIZATION WITH Beatrix Fetters;  Surgeon: Blane Ohara, MD;  Location: [facility] CATH LAB;  Service: Cardiovascular;  Laterality: N/A;  . LEFT HEART CATHETERIZATION WITH CORONARY/GRAFT ANGIOGRAM N/A 02/14/2012   Procedure: LEFT HEART CATHETERIZATION WITH Beatrix Fetters;  Surgeon: Peter M Martinique, MD;  Location: [facility] CATH LAB;  Service: Cardiovascular;  Laterality: N/A;  . PERCUTANEOUS CORONARY STENT INTERVENTION (PCI-S)  10/14/2014   Procedure: PERCUTANEOUS CORONARY STENT  INTERVENTION (PCI-S);  Surgeon: Blane Ohara, MD;  Location: [facility] CATH LAB;  Service: Cardiovascular;;  . PERCUTANEOUS STENT INTERVENTION  02/03/2012   Procedure: PERCUTANEOUS STENT INTERVENTION;  Surgeon: [name], MD;  Location[facility] CATH LAB;  Service: Cardiovascular;;  . SHOULDER ARTHROSCOPY W/ ROTATOR CUFF REPAIR Right   . TRANSCATHETER AORTIC VALVE REPLACEMENT, TRANSFEMORAL N/A 11/05/2014   Procedure: TRANSCATHETER AORTIC VALVE REPLACEMENT, TRANSFEMORAL;  Surgeon: [name], MD;  Location: [facility] OR; Berniece Pap 3 THV (size 29 mm, model # F048547, serial # [id number])     Family History  Problem Relation Age of Onset  . Coronary artery disease    . Alcohol abuse    . Other Mother   . Other Father       Social History   Social History  . Marital status: Married    Spouse name: N/A  . Number of children: N/A  . Years of education: N/A   Occupational History  . retired [employer]   Social History Main Topics  . Smoking status:  Former Smoker    Packs/day: 1.00    Years: 30.00    Types: Cigarettes, Pipe    Quit date: 03/29/1985  . Smokeless tobacco: Former Systems developer    Types: Chew     Comment: "chewed for ~ 1 yr after I quit smoking"  . Alcohol use No  . Drug use: No  . Sexual activity: Not Currently   Other Topics Concern  . None   Social History Narrative   Admitted to [facility]13/16   Married   Former smoker-stopped 1986   Alcohol none   DNR    Allergies  Allergen Reactions  . Losartan Other (See Comments)    Hyperkalemia > 7  . Azithromycin Itching  . Contrast Media [Iodinated Diagnostic Agents] Hives, Itching and Rash    Red/yellow  . Doxycycline Itching       . Keflex [Cephalexin] Itching  . Macrodantin Itching  . Minocycline Hcl Itching  . Nitrofurantoin Itching  . Zithromax [Azithromycin Dihydrate] Itching     Current Outpatient Prescriptions:  .  ANORO ELLIPTA 62.5-25 MCG/INH AEPB, Inhale 1 puff into the lungs daily. , Disp: ,  Rfl:  .  aspirin EC 81 MG tablet, Take 81 mg by mouth every morning. , Disp: , Rfl:  .  clopidogrel (PLAVIX) 75 MG tablet, TAKE ONE TABLET BY MOUTH ONCE DAILY, Disp: 90 tablet, Rfl: 3 .  dextromethorphan-guaiFENesin (MUCINEX DM) 30-600 MG 12hr tablet, Take 1 tablet by mouth 2 (two) times daily as needed for cough., Disp: , Rfl:  .  docusate sodium (COLACE) 100 MG capsule, Take 100 mg by mouth daily as needed for moderate constipation. , Disp: , Rfl:  .  ENSURE (ENSURE), Take 237 mLs by mouth 3 (three) times daily as needed (for poor appetite)., Disp: , Rfl:  .  famotidine (PEPCID) 20 MG tablet, Take 20 mg by mouth daily as needed for heartburn or indigestion., Disp: , Rfl:  .  feeding supplement, ENSURE ENLIVE, (ENSURE ENLIVE) LIQD, Take 237 mLs by mouth 2 (two) times daily between meals., Disp: 237 mL, Rfl: 12 .  ferrous sulfate 325 (65 FE) MG tablet, Take 325 mg by mouth daily with breakfast., Disp: , Rfl:  .  furosemide (LASIX) 20 MG tablet, Take 1 tablet (20 mg total) by mouth as directed. 2 tabs in the AM and 1 tab in the PM, Disp: , Rfl:  .  gabapentin (NEURONTIN) 300 MG capsule, Take 300 mg by mouth 2 (two) times daily. , Disp: , Rfl:  .  hydrOXYzine (ATARAX/VISTARIL) 25 MG tablet, Take 25 mg by mouth every 6 (six) hours as needed for itching., Disp: , Rfl:  .  metoprolol succinate (TOPROL-XL) 25 MG 24 hr tablet, Take 1 tablet (25 mg total) by mouth daily., Disp: , Rfl:  .  Multiple Vitamins-Minerals (MULTIVITAMIN WITH MINERALS) tablet, Take 1 tablet by mouth daily., Disp: , Rfl:  .  nitroGLYCERIN (NITROSTAT) 0.4 MG SL tablet, Place 1 tablet (0.4 mg total) under the tongue every 5 (five) minutes as needed for chest pain., Disp: 25 tablet, Rfl: 1 .  pravastatin (PRAVACHOL) 40 MG tablet, Take 1 tablet (40 mg total) by mouth every evening., Disp: 90 tablet, Rfl: 3 .  promethazine (PHENERGAN) 25 MG tablet, Take 25 mg by mouth every 6 (six) hours as needed. n/v, Disp: , Rfl:  .  spironolactone  (ALDACTONE) 25 MG tablet, Take 0.5 tablets (12.5 mg total) by mouth daily., Disp: 30 tablet, Rfl: 0 .  traMADol (ULTRAM) 50 MG  tablet, Take 1 tablet (50 mg total) by mouth every 12 (twelve) hours as needed for moderate pain., Disp: 10 tablet, Rfl: 0 .  vitamin B-12 (CYANOCOBALAMIN) 500 MCG tablet, Take 1,000 mcg by mouth daily. , Disp: , Rfl:  .  metFORMIN (GLUCOPHAGE) 500 MG tablet, Take 500 mg by mouth 2 (two) times daily with a meal. , Disp: , Rfl:  .  phenol (CHLORASEPTIC) 1.4 % LIQD, Use as directed 2 sprays in the mouth or throat every 2 (two) hours as needed for throat irritation / pain., Disp: , Rfl:  .  polyethylene glycol (MIRALAX / GLYCOLAX) packet, Take 17 g by mouth daily as needed for moderate constipation or severe constipation., Disp: , Rfl:  .  UNKNOWN TO PATIENT, Apply 1 application topically daily as needed (bed sore.). "Medication comparable to nystatin cream but cheaper" for bed sores., Disp: , Rfl:    Review of Systems  Constitutional: Negative for activity change, appetite change, chills, diaphoresis, fatigue, fever and unexpected weight change.  HENT: Negative for congestion, rhinorrhea, sinus pressure, sneezing, sore throat and trouble swallowing.   Eyes: Negative for photophobia and visual disturbance.  Respiratory: Negative for cough, chest tightness, shortness of breath, wheezing and stridor.   Cardiovascular: Negative for chest pain, palpitations and leg swelling.  Gastrointestinal: Negative for abdominal distention, abdominal pain, anal bleeding, blood in stool, constipation, diarrhea, nausea and vomiting.  Genitourinary: Negative for difficulty urinating, dysuria, flank pain and hematuria.  Musculoskeletal: Negative for arthralgias, back pain, gait problem, joint swelling and myalgias.  Skin: Negative for color change, pallor, rash and wound.  Neurological: Negative for dizziness, tremors, weakness and light-headedness.  Hematological: Negative for adenopathy.  Bruises/bleeds easily.  Psychiatric/Behavioral: Negative for agitation, behavioral problems, confusion, decreased concentration, dysphoric mood and sleep disturbance.       Objective:   Physical Exam  Constitutional: No distress.  HENT:  Head: Normocephalic.  Mouth/Throat: No oropharyngeal exudate.  Eyes: Conjunctivae and EOM are normal. Right eye exhibits no discharge. Left eye exhibits no discharge.  Neck: Neck supple. No tracheal deviation present.  Cardiovascular: An irregularly irregular rhythm present.  Murmur heard. Pulmonary/Chest: Effort normal and breath sounds normal. No respiratory distress. He has no wheezes. He has no rales. He exhibits no tenderness.  Abdominal: Soft.  Skin: He is not diaphoretic.  Psychiatric: He has a normal mood and affect. His speech is normal and behavior is normal.          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 Alvy Bimler offered to perform all the labs it oncology want today along with the blood cultures that we want.

## 2016-06-30 NOTE — Telephone Encounter (Signed)
Wife left VM state pt saw Dr. Guadlupe Spanish today and he did the same lab work that Dr. Alvy Bimler ordered for tomorrow.  Wants to know if he can cancel his lab for tomorrow?   I do not see CBC or CMET was done today, only blood cultures.  I called wife back and left VM asking her to confirm w/ ID office that they were drawn.  If so we can certainly cancel his lab here tomorrow.

## 2016-06-30 NOTE — Telephone Encounter (Signed)
CBC and CMET now in EMR,  Results are pending but can see labs were drawn.   Notified wife ok to cancel lab appt here for tomorrow.   Keep appt w/ Dr. Alvy Bimler as scheduled.  She verbalized understanding.

## 2016-07-01 ENCOUNTER — Encounter: Payer: Self-pay | Admitting: Hematology and Oncology

## 2016-07-01 ENCOUNTER — Other Ambulatory Visit: Payer: Medicare Other

## 2016-07-01 ENCOUNTER — Ambulatory Visit (HOSPITAL_BASED_OUTPATIENT_CLINIC_OR_DEPARTMENT_OTHER): Payer: Medicare Other | Admitting: Hematology and Oncology

## 2016-07-01 DIAGNOSIS — T826XXD Infection and inflammatory reaction due to cardiac valve prosthesis, subsequent encounter: Secondary | ICD-10-CM | POA: Diagnosis not present

## 2016-07-01 DIAGNOSIS — I38 Endocarditis, valve unspecified: Secondary | ICD-10-CM

## 2016-07-01 DIAGNOSIS — C8310 Mantle cell lymphoma, unspecified site: Secondary | ICD-10-CM | POA: Diagnosis present

## 2016-07-01 DIAGNOSIS — D61818 Other pancytopenia: Secondary | ICD-10-CM | POA: Diagnosis not present

## 2016-07-01 NOTE — Assessment & Plan Note (Signed)
The patient is in very bad shape due to recent recurrent hospitalization CT scan from May 2017 showed numerous different abnormalities but nothing conclusive for diagnosis of lymphoma Overall, the patient is not a candidate for systemic treatment now. The patient has made informed decision that he will never take further chemotherapy. For this reason, there is no need for him to return back to the [facility] for further follow-up

## 2016-07-01 NOTE — Assessment & Plan Note (Signed)
This is multi-factorial, likely due to bone marrow suppression from recent infection versus malnutrition related to side effects from recent antibiotic therapy Lymphoma involvement cannot be excluded He is not symptomatic from pancytopenia Observe only. No transfusion is needed.

## 2016-07-01 NOTE — Progress Notes (Signed)
[facility] OFFICE PROGRESS NOTE  Patient Care Team: Leanna Battles, MD as PCP - General (Internal Medicine)  SUMMARY OF ONCOLOGIC HISTORY:  I review his records extensively. The patient has recurrent admission to the hospital in the past 2 months. He was just discharged recently for pneumonia. In terms of prior history of mantle cell lymphoma, he was lost to follow-up since 2014. I reviewed his electronic records and collaborated the history with the patient. The patient was diagnosed with mantle cell lymphoma in March 2013 after presentation with pancytopenia and splenomegaly. He underwent bone marrow biopsy dated 03/29/2012 which came back consistent with mantle cell lymphoma. The patient received combination chemotherapy with bendamustine and rituximab. His last dose was given around December 2014 and he subsequently cancelled his appointment and follow-up at [facility] The patient has significant major comorbidities. He has severe coronary artery disease status post bypass surgery and valvular heart disease status post surgical repair. He is on chronic anticoagulation therapy. The patient also has history of osteomyelitis and foot amputation related to that. He had serial imaging study over the years. Prior to current imaging study, CT angiogram of the abdomen and pelvis dated November 2015, showed persistent splenomegaly and abnormal liver appearance consistent with liver cirrhosis. On 03/12/2016, he has CT abdomen and pelvis without contrast which show persistent splenomegaly. On this current admission, on 04/07/2016, he underwent CT scan of the chest, abdomen and pelvis without contrast which is very difficult to read, in my opinion, which showed several abnormal findings including persistent splenomegaly and questionable new liver lesions. The patient complained of chronic weakness since recent hospitalization & poor appetite He denies palpable lymphadenopathy He  was admitted to the hospital from 04/06/2016 to 04/13/2016 and was found to have enterococcal septicemia requiring IV antibiotics. He was discharged to skilled nursing facility  INTERVAL HISTORY: Please see below for problem oriented charting. He denies chest pain or shortness of breath He has no recent fevers or chills. He had completed antibiotic therapy recently The patient denies any recent signs or symptoms of bleeding such as spontaneous epistaxis, hematuria or hematochezia. Since the last time I saw him, he had another recurrent hospitalization He denies new lymphadenopathy His appetite is getting better and he has gained some weight  REVIEW OF SYSTEMS:   Constitutional: Denies fevers, chills or abnormal weight loss Eyes: Denies blurriness of vision Ears, nose, mouth, throat, and face: Denies mucositis or sore throat Respiratory: Denies cough, dyspnea or wheezes Cardiovascular: Denies palpitation, chest discomfort or lower extremity swelling Gastrointestinal:  Denies nausea, heartburn or change in bowel habits Skin: Denies abnormal skin rashes Lymphatics: Denies new lymphadenopathy  Neurological:Denies numbness, tingling or new weaknesses Behavioral/Psych: Mood is stable, no new changes  All other systems were reviewed with the patient and are negative.  I have reviewed the past medical history, past surgical history, social history and family history with the patient and they are unchanged from previous note.  ALLERGIES:  is allergic to losartan; azithromycin; contrast media [iodinated diagnostic agents]; doxycycline; keflex [cephalexin]; macrodantin; minocycline hcl; nitrofurantoin; and zithromax [azithromycin dihydrate].  MEDICATIONS:  Current Outpatient Prescriptions  Medication Sig Dispense Refill  . ANORO ELLIPTA 62.5-25 MCG/INH AEPB Inhale 1 puff into the lungs daily.     Marland Kitchen aspirin EC 81 MG tablet Take 81 mg by mouth every morning.     . clopidogrel (PLAVIX) 75 MG  tablet TAKE ONE TABLET BY MOUTH ONCE DAILY 90 tablet 3  . dextromethorphan-guaiFENesin (MUCINEX DM) 30-600 MG 12hr  tablet Take 1 tablet by mouth 2 (two) times daily as needed for cough.    . docusate sodium (COLACE) 100 MG capsule Take 100 mg by mouth daily as needed for moderate constipation.     . ENSURE (ENSURE) Take 237 mLs by mouth 3 (three) times daily as needed (for poor appetite).    . famotidine (PEPCID) 20 MG tablet Take 20 mg by mouth daily as needed for heartburn or indigestion.    . feeding supplement, ENSURE ENLIVE, (ENSURE ENLIVE) LIQD Take 237 mLs by mouth 2 (two) times daily between meals. 237 mL 12  . ferrous sulfate 325 (65 FE) MG tablet Take 325 mg by mouth daily with breakfast.    . furosemide (LASIX) 20 MG tablet Take 1 tablet (20 mg total) by mouth as directed. 2 tabs in the AM and 1 tab in the PM    . gabapentin (NEURONTIN) 300 MG capsule Take 300 mg by mouth 2 (two) times daily.     . hydrOXYzine (ATARAX/VISTARIL) 25 MG tablet Take 25 mg by mouth every 6 (six) hours as needed for itching.    . metoprolol succinate (TOPROL-XL) 25 MG 24 hr tablet Take 1 tablet (25 mg total) by mouth daily.    . Multiple Vitamins-Minerals (MULTIVITAMIN WITH MINERALS) tablet Take 1 tablet by mouth daily.    . nitroGLYCERIN (NITROSTAT) 0.4 MG SL tablet Place 1 tablet (0.4 mg total) under the tongue every 5 (five) minutes as needed for chest pain. 25 tablet 1  . phenol (CHLORASEPTIC) 1.4 % LIQD Use as directed 2 sprays in the mouth or throat every 2 (two) hours as needed for throat irritation / pain.    . polyethylene glycol (MIRALAX / GLYCOLAX) packet Take 17 g by mouth daily as needed for moderate constipation or severe constipation.    . pravastatin (PRAVACHOL) 40 MG tablet Take 1 tablet (40 mg total) by mouth every evening. 90 tablet 3  . promethazine (PHENERGAN) 25 MG tablet Take 25 mg by mouth every 6 (six) hours as needed. n/v    . spironolactone (ALDACTONE) 25 MG tablet Take 0.5 tablets  (12.5 mg total) by mouth daily. 30 tablet 0  . traMADol (ULTRAM) 50 MG tablet Take 1 tablet (50 mg total) by mouth every 12 (twelve) hours as needed for moderate pain. 10 tablet 0  . UNKNOWN TO PATIENT Apply 1 application topically daily as needed (bed sore.). "Medication comparable to nystatin cream but cheaper" for bed sores.    . vitamin B-12 (CYANOCOBALAMIN) 500 MCG tablet Take 1,000 mcg by mouth daily.      No current facility-administered medications for this visit.     PHYSICAL EXAMINATION: ECOG PERFORMANCE STATUS: 2 - Symptomatic, <50% confined to bed  Vitals:   07/01/16 1342  BP: (!) 123/49  Pulse: 75  Resp: 18  Temp: 98.1 F (36.7 C)   Filed Weights   07/01/16 1342  Weight: 168 lb 9.6 oz (76.5 kg)    GENERAL:alert, no distress and comfortable. He looks debilitated SKIN: He has significant solar keratosis and skin bruises EYES: normal, Conjunctiva are pink and non-injected, sclera clear OROPHARYNX:no exudate, no erythema and lips, buccal mucosa, and tongue normal  NECK: supple, thyroid normal size, non-tender, without nodularity LYMPH:  no palpable lymphadenopathy in the cervical, axillary or inguinal LUNGS: He has normal breathing effort. Bilateral crackles are noted  HEART: regular rate & rhythm and no murmurs and no lower extremity edema ABDOMEN:abdomen soft, non-tender and normal bowel sounds Musculoskeletal:no cyanosis of  digits and no clubbing  NEURO: alert & oriented x 3 with fluent speech, no focal motor/sensory deficits  LABORATORY DATA:  I have reviewed the data as listed    Component Value Date/Time   NA 137 06/30/2016 1534   NA 140 04/30/2016   NA 137 11/23/2013 0945   K 4.4 06/30/2016 1534   K 7.2 (HH) 11/23/2013 0945   CL 97 (L) 06/30/2016 1534   CL 108 (H) 05/30/2013 0935   CO2 25 06/30/2016 1534   CO2 19 (L) 11/23/2013 0945   GLUCOSE 145 (H) 06/30/2016 1534   GLUCOSE 95 11/23/2013 0945   GLUCOSE 109 (H) 05/30/2013 0935   BUN 40 (H)  06/30/2016 1534   BUN 21 04/30/2016   BUN 37.4 (H) 11/23/2013 0945   CREATININE 1.21 (H) 06/30/2016 1534   CREATININE 1.6 (H) 11/23/2013 0945   CALCIUM 9.6 06/30/2016 1534   CALCIUM 10.2 11/23/2013 0945   PROT 5.5 (L) 06/30/2016 1534   PROT 6.3 (L) 11/23/2013 0945   ALBUMIN 3.9 06/30/2016 1534   ALBUMIN 4.2 11/23/2013 0945   AST 22 06/30/2016 1534   AST 23 11/23/2013 0945   ALT 18 06/30/2016 1534   ALT 19 11/23/2013 0945   ALKPHOS 246 (H) 06/30/2016 1534   ALKPHOS 126 11/23/2013 0945   BILITOT 0.8 06/30/2016 1534   BILITOT 0.41 11/23/2013 0945   GFRNONAA 56 (L) 05/28/2016 0359   GFRAA >60 05/28/2016 0359    No results found for: SPEP, UPEP  Lab Results  Component Value Date   WBC 6.9 06/30/2016   NEUTROABS 5,175 06/30/2016   HGB 10.1 (L) 06/30/2016   HCT 31.6 (L) 06/30/2016   MCV 95.2 06/30/2016   PLT 63 (L) 06/30/2016      Chemistry      Component Value Date/Time   NA 137 06/30/2016 1534   NA 140 04/30/2016   NA 137 11/23/2013 0945   K 4.4 06/30/2016 1534   K 7.2 (HH) 11/23/2013 0945   CL 97 (L) 06/30/2016 1534   CL 108 (H) 05/30/2013 0935   CO2 25 06/30/2016 1534   CO2 19 (L) 11/23/2013 0945   BUN 40 (H) 06/30/2016 1534   BUN 21 04/30/2016   BUN 37.4 (H) 11/23/2013 0945   CREATININE 1.21 (H) 06/30/2016 1534   CREATININE 1.6 (H) 11/23/2013 0945   GLU 111 04/30/2016      Component Value Date/Time   CALCIUM 9.6 06/30/2016 1534   CALCIUM 10.2 11/23/2013 0945   ALKPHOS 246 (H) 06/30/2016 1534   ALKPHOS 126 11/23/2013 0945   AST 22 06/30/2016 1534   AST 23 11/23/2013 0945   ALT 18 06/30/2016 1534   ALT 19 11/23/2013 0945   BILITOT 0.8 06/30/2016 1534   BILITOT 0.41 11/23/2013 0945      ASSESSMENT & PLAN:  Mantle cell lymphoma ([place]) The patient is in very bad shape due to recent recurrent hospitalization CT scan from May 2017 showed numerous different abnormalities but nothing conclusive for diagnosis of lymphoma Overall, the patient is not a  candidate for systemic treatment now. The patient has made informed decision that he will never take further chemotherapy. For this reason, there is no need for him to return back to the [facility] for further follow-up  Pancytopenia, acquired [facility]) This is multi-factorial, likely due to bone marrow suppression from recent infection versus malnutrition related to side effects from recent antibiotic therapy Lymphoma involvement cannot be excluded He is not symptomatic from pancytopenia Observe only. No transfusion is  needed.  Prosthetic valve endocarditis ([place]) He had recent prosthetic valve endocarditis and has completed a course of antibiotic therapy. He was seen in the infectious disease clinic yesterday. Blood culture is pending. Due to high risk of sepsis, he is not a candidate for chemotherapy   No orders of the defined types were placed in this encounter.  All questions were answered. The patient knows to call the clinic with any problems, questions or concerns. No barriers to learning was detected. I spent 15 minutes counseling the patient face to face. The total time spent in the appointment was 20 minutes and more than 50% was on counseling and review of test results     [facility], [place], MD 07/01/2016 2:51 PM

## 2016-07-01 NOTE — Assessment & Plan Note (Signed)
He had recent prosthetic valve endocarditis and has completed a course of antibiotic therapy. He was seen in the infectious disease clinic yesterday. Blood culture is pending. Due to high risk of sepsis, he is not a candidate for chemotherapy

## 2016-07-07 LAB — CULTURE, BLOOD (SINGLE)
ORGANISM ID, BACTERIA: NO GROWTH
ORGANISM ID, BACTERIA: NO GROWTH

## 2016-07-26 ENCOUNTER — Ambulatory Visit: Payer: Medicare Other | Admitting: Physician Assistant

## 2016-08-02 NOTE — Progress Notes (Signed)
Cardiology Office Note:    Date:  08/03/2016   ID:  [name], DOB 09-16-1927, MRN [id number]  PCP:  Donnajean Lopes, MD  Cardiologist:  Dr. [name]   Electrophysiologist:  N/a Oncologist: Dr. Alvy Bimler  Referring MD: Leanna Battles, MD   Chief Complaint  Patient presents with  . Follow-up    CHF    History of Present Illness:    [name] is a 82 y.o. male with a hx of CAD s/p CABG, aortic stenosis, carotid artery disease, HTN, HL, PVD, COPD, chronic DVT, PAF, Mantle Cell Lymphoma.  He is s/p TAVR in 12/15.  Prior to his TAVR, he underwent PCI with DES to the S-PDA b/c of critical in-stent restenosis in that graft.  He was admitted in 5/17 with enterococcal sepsis and was treated with 6 weeks of empiric antibiotics to cover for SBE with hx of TAVR.  FU echo demonstrated worsening LVF with EF 25-30%.  Troponin levels were elevated.  However, given multiple comorbid conditions, conservative management was pursued. He was made DNR. ACE/ARB was avoided due to prior hx of hyperkalemia.  Of note, he was in AF with RVR during his hospital stay.  He was seen by Dr. [name] in 5/17 after DC and diuretics were adjusted due to volume excess.  He was not felt to be a candidate for anticoagulation given hx of thrombocytopenia and frailty.  He was then admitted in 6/17 with generalized weakness and hypotension requiring IV fluids.   Last seen by me 06/14/16.  He was living at [facility].  He was overall stable at that time.  He returns for FU.  Here with his wife and daughter.  He has FU with ID and repeat blood cultures were neg. He has also FU with oncology and it has been decided to not pursue further chemotherapy.  He continues to get weaker. His breathing is stable.  He denies chest pain or syncope.  He denies significant LE edema.  He denies orthopnea, PND.  His weights are stable.  He does note a periodic cough. No hemoptysis.     Prior CV studies that were reviewed  today include:    Echo 04/08/16 EF 25-30%, s/p TAVR, peak and mean 17 and 10 mmHg, mild MR, MAC, severe LAE, mod reduced RVSF, severe RAE, mod TR  Carotid US 3/16 Stable 1-39% bilateral ICA stenosis. >> FU 2 years  PCI 10/14/14 3.0x20 mm Promus DES to SVG-PDA  LHC 09/23/14 Left mainstem: 100% occlusion Left anterior descending (LAD): Totally occluded from the native circulation. Fills entirely from the LIMA graft. Left circumflex (LCx): Totally occluded from the native circulation. Fills entirely from the sequence graft to OM1 and OM 2 Right coronary artery (RCA): Severely calcified. The vessel has long segment 70% stenosis in its midportion. It is 100% occluded in the distal portion. Saphenous vein graft to PDA: The ostium and proximal portion of the graft is stented. The stented segment has sequential 95% stenoses. The remaining portions of the bypass graft are patent with mild diffuse disease noted in the distal body of the vein graft. The PDA insertion site appears patent. The PLA and distal RCA filled retrograde from graft flow. Saphenous vein graft sequential to OM1 and OM 2: Widely patent throughout. There are 3 major OM branches filling from this graft. LIMA to LAD: Widely patent throughout. The distal anastomosis is widely patent. The proximal LAD fills retrograde. Left ventriculography: LV function appears low normal, with an LVEF  estimated at 50-55%. The mitral annulus is severely calcified. The aortic valve is severely calcified. Final Conclusions:  1. Severe native three-vessel disease with total occlusion of the RCA and total occlusion of the left main 2. Status post aortocoronary bypass surgery with continued patency of the LIMA to LAD and sequential saphenous vein graft to OM1 and OM 2 3. Severe in-stent restenosis in the saphenous vein graft to PDA 4. Low normal LV systolic function with normal right heart cardiac filling pressures 5. Moderate to severe aortic  stenosis Recommendations: The patient's aortic stenosis is severe by echo criteria. His valve is heavily calcified with restricted opening of all leaflets. He also has high-grade obstructive disease in the stented segment of the RCA graft. Will refer to Dr. Cyndia Bent for consideration of further treatment options, which includes TAVR/PCI versus redo CABG/AVR  Past Medical History:  Diagnosis Date  . Anemia   . Anginal pain ([place])   . Aortic stenosis, severe    a. ECHO 07/22/14 Severe AS. Peak and mean gradients of 57 mmHg and 42 mmHg, respectively. Calculated AVA is 0.8-0.9 cm2. Trivial AR. Valve area (VTI): 0.84 cm^2. Valve area (Vmax): 0.86 cm^2.. Aortic root dimension: 42 mm (ED) and aortic root mildly dilated;  b. 10/2014 S/P TAVR w/ Edwards Sapien 3 THV 23mm;  c. 10/2014 Echo: EF 50%, nl fxning AoV, mean grad of 9, peak of 16.  . Asthmatic bronchitis   . BPH (benign prostatic hypertrophy)   . CAD (coronary artery disease)    a. s/p CABG 1995;  b. NSTEMI & subsequent BMS to SVG->right PDA 02/03/12.;  c. Cath 02/14/2012 3vd with 4/4 patent grafts and patent stent in vg->pda;  d. 10/2014 PCI/DES to the VG->PDA.  . Carotid stenosis    a. Carotid dopplers [id number]: RICA [id number], LICA [id number];  b. dopplers 3/14:  123456 RICA, [id number] LICA  . COPD (chronic obstructive pulmonary disease) ([place])   . DJD (degenerative joint disease)   . DM2 (diabetes mellitus, type 2) ([place])   . Heart murmur   . Herpes zoster   . HLD (hyperlipidemia)   . HTN (hypertension)   . Leg DVT (deep venous thromboembolism), chronic ([place])    a. LE dopplers 5/13, 10/13 and 1/14: chronic DVT involving right mid femoral vein, left mid femoral vein, and left popliteal vein.  . Mantle cell lymphoma of intra-abdominal lymph nodes (H[facility])    a. Stage III s/p bendamustine, Rituxan therapy  . Myocardial infarction [facility]) ?1995  . Osteomyelitis ([place])    a. L first foot ray amputation 09/2012  . Peripheral vascular disease ([place])    a. s/p L ray amp  10/13;  b. s/p L 2nd toe amp 12/13  . Pneumonia ?2014  . Pulmonary nodule    a. 70mm RUL calcified pulm nodule noted on CT staging for lymphoma - stable 10/2012.  . 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 29 mm Edwards Sapien 3 transcatheter heart valve placed via open right transfemoral approach  . Splenomegaly   . Thrombocytopenia ([place])    a. secondary to splenomegaly related to lymphoma with suspicion of bone marrow involvement. (Plavix had to be stopped due to this)    Past Surgical History:  Procedure Laterality Date  . ACHILLES TENDON LENGTHENING Left 12/20/2013   Procedure: ACHILLES TENDON LENGTHENING;  Surgeon: Wylene Simmer, MD;  Location: [place];  Service: Orthopedics;  Laterality: Left;  . AMPUTATION  09/16/2012   Procedure: AMPUTATION RAY;  Surgeon: Wylene Simmer, MD;  Location: [facility];  Service: Orthopedics;  Laterality: Left;  first Ray amputation left foot  . AMPUTATION  11/21/2012   Procedure: AMPUTATION RAY;  Surgeon: Wylene Simmer, MD;  Location: [place];  Service: Orthopedics;  Laterality: Left;  LEFT SECOND TOE AMPUTATION  . AMPUTATION Left 12/20/2013   Procedure: AMPUTATION LEFT 5TH TRANSMETATARSAL;  Surgeon: Wylene Simmer, MD;  Location: [place];  Service: Orthopedics;  Laterality: Left;  . BACK SURGERY    . CARPAL TUNNEL RELEASE Bilateral   . CATARACT EXTRACTION W/ INTRAOCULAR LENS  IMPLANT, BILATERAL Bilateral   . CORONARY ANGIOPLASTY WITH STENT PLACEMENT  09/2014; 10/14/2014   "?2; 1"  . CORONARY ARTERY BYPASS GRAFT  1995  . HERNIA REPAIR    . I&D EXTREMITY  09/16/2012   Procedure: IRRIGATION AND DEBRIDEMENT EXTREMITY;  Surgeon: Wylene Simmer, MD;  Location: [facility];  Service: Orthopedics;  Laterality: Left;  irrigation and debridement and first Ray amputation of left foot  . I&D EXTREMITY Left 03/01/2013   Procedure: IRRIGATION AND DEBRIDEMENT EXTREMITY;  Surgeon: Wylene Simmer, MD;  Location: [facility];  Service: Orthopedics;  Laterality: Left;  IRRIGATION   AND  DEBRIDEMENT  LEFT  FOOT  . INCISIONAL HERNIA REPAIR    . INTRAOPERATIVE TRANSESOPHAGEAL ECHOCARDIOGRAM N/A 11/05/2014   Procedure: INTRAOPERATIVE TRANSESOPHAGEAL ECHOCARDIOGRAM;  Surgeon: [name], MD;  Location: [facility] OR;  Service: Open Heart Surgery;  Laterality: N/A;  . KIDNEY SURGERY     "cut me 1/2 in 2 for stones"  . LEFT AND RIGHT HEART CATHETERIZATION WITH CORONARY/GRAFT ANGIOGRAM N/A 02/03/2012   Procedure: LEFT AND RIGHT HEART CATHETERIZATION WITH Beatrix Fetters;  Surgeon: [name], MD;  Location: [facility] CATH LAB;  Service: Cardiovascular;  Laterality: N/A;  . LEFT AND RIGHT HEART CATHETERIZATION WITH CORONARY/GRAFT ANGIOGRAM N/A 09/23/2014   Procedure: LEFT AND RIGHT HEART CATHETERIZATION WITH Beatrix Fetters;  Surgeon: Blane Ohara, MD;  Location: [facility] CATH LAB;  Service: Cardiovascular;  Laterality: N/A;  . LEFT HEART CATHETERIZATION WITH CORONARY/GRAFT ANGIOGRAM N/A 02/14/2012   Procedure: LEFT HEART CATHETERIZATION WITH Beatrix Fetters;  Surgeon: Peter M Martinique, MD;  Location: [facility] CATH LAB;  Service: Cardiovascular;  Laterality: N/A;  . PERCUTANEOUS CORONARY STENT INTERVENTION (PCI-S)  10/14/2014   Procedure: PERCUTANEOUS CORONARY STENT INTERVENTION (PCI-S);  Surgeon: Blane Ohara, MD;  Location: [facility] CATH LAB;  Service: Cardiovascular;;  . PERCUTANEOUS STENT INTERVENTION  02/03/2012   Procedure: PERCUTANEOUS STENT INTERVENTION;  Surgeon: [name], MD;  Location: [facility] CATH LAB;  Service: Cardiovascular;;  . SHOULDER ARTHROSCOPY W/ ROTATOR CUFF REPAIR Right   . TRANSCATHETER AORTIC VALVE REPLACEMENT, TRANSFEMORAL N/A 11/05/2014   Procedure: TRANSCATHETER AORTIC VALVE REPLACEMENT, TRANSFEMORAL;  Surgeon: [name], MD;  Location: [facility] OR; Berniece Pap 3 THV (size 29 mm, model # [id number], serial # [id number])     Current Medications: Outpatient Medications Prior to Visit  Medication Sig Dispense Refill  . ANORO ELLIPTA 62.5-25 MCG/INH AEPB  Inhale 1 puff into the lungs daily.     Marland Kitchen aspirin EC 81 MG tablet Take 81 mg by mouth every morning.     . clopidogrel (PLAVIX) 75 MG tablet TAKE ONE TABLET BY MOUTH ONCE DAILY 90 tablet 3  . dextromethorphan-guaiFENesin (MUCINEX DM) 30-600 MG 12hr tablet Take 1 tablet by mouth 2 (two) times daily as needed for cough.    . docusate sodium (COLACE) 100 MG capsule Take 100 mg by mouth daily as needed for moderate constipation.     . ENSURE (ENSURE) Take 237 mLs by mouth 3 (  three) times daily as needed (for poor appetite).    . feeding supplement, ENSURE ENLIVE, (ENSURE ENLIVE) LIQD Take 237 mLs by mouth 2 (two) times daily between meals. 237 mL 12  . ferrous sulfate 325 (65 FE) MG tablet Take 325 mg by mouth daily with breakfast.    . gabapentin (NEURONTIN) 300 MG capsule Take 300 mg by mouth 2 (two) times daily.     . hydrOXYzine (ATARAX/VISTARIL) 25 MG tablet Take 25 mg by mouth every 6 (six) hours as needed for itching.    . metoprolol succinate (TOPROL-XL) 25 MG 24 hr tablet Take 1 tablet (25 mg total) by mouth daily.    . Multiple Vitamins-Minerals (MULTIVITAMIN WITH MINERALS) tablet Take 1 tablet by mouth daily.    . nitroGLYCERIN (NITROSTAT) 0.4 MG SL tablet Place 1 tablet (0.4 mg total) under the tongue every 5 (five) minutes as needed for chest pain. 25 tablet 1  . phenol (CHLORASEPTIC) 1.4 % LIQD Use as directed 2 sprays in the mouth or throat every 2 (two) hours as needed for throat irritation / pain.    . polyethylene glycol (MIRALAX / GLYCOLAX) packet Take 17 g by mouth daily as needed for moderate constipation or severe constipation.    . pravastatin (PRAVACHOL) 40 MG tablet Take 1 tablet (40 mg total) by mouth every evening. 90 tablet 3  . promethazine (PHENERGAN) 25 MG tablet Take 25 mg by mouth every 6 (six) hours as needed. n/v    . spironolactone (ALDACTONE) 25 MG tablet Take 0.5 tablets (12.5 mg total) by mouth daily. 30 tablet 0  . traMADol (ULTRAM) 50 MG tablet Take 1 tablet (50  mg total) by mouth every 12 (twelve) hours as needed for moderate pain. 10 tablet 0  . UNKNOWN TO PATIENT Apply 1 application topically daily as needed (bed sore.). "Medication comparable to nystatin cream but cheaper" for bed sores.    . vitamin B-12 (CYANOCOBALAMIN) 500 MCG tablet Take 1,000 mcg by mouth daily.     . famotidine (PEPCID) 20 MG tablet Take 20 mg by mouth daily as needed for heartburn or indigestion.    . furosemide (LASIX) 20 MG tablet Take 1 tablet (20 mg total) by mouth as directed. 2 tabs in the AM and 1 tab in the PM (Patient not taking: Reported on 08/03/2016)     No facility-administered medications prior to visit.       Allergies:   Losartan; Azithromycin; Contrast media [iodinated diagnostic agents]; Doxycycline; Keflex [cephalexin]; Macrodantin; Minocycline hcl; Nitrofurantoin; and Zithromax [azithromycin dihydrate]   Social History   Social History  . Marital status: Married    Spouse name: N/A  . Number of children: N/A  . Years of education: N/A   Occupational History  . retired [employer]   Social History Main Topics  . Smoking status: Former Smoker    Packs/day: 1.00    Years: 30.00    Types: Cigarettes, Pipe    Quit date: 03/29/1985  . Smokeless tobacco: Former Systems developer    Types: Chew     Comment: "chewed for ~ 1 yr after I quit smoking"  . Alcohol use No  . Drug use: No  . Sexual activity: Not Currently   Other Topics Concern  . None   Social History Narrative   Admitted to [facility] 04/13/16   Married   Former smoker-stopped 1986   Alcohol none   DNR     Family History:  The patient's family history includes Other in  his father and mother.   ROS:   Please see the history of present illness.    Review of Systems  Constitution: Positive for decreased appetite and malaise/fatigue.  Respiratory: Positive for wheezing.   Hematologic/Lymphatic: Bruises/bleeds easily.  Neurological: Positive for loss of balance.   All other systems  reviewed and are negative.   EKGs/Labs/Other Test Reviewed:    EKG:  EKG is  ordered today.  The ekg ordered today demonstrates AFib, HR 64, LAD, septal Q waves, QTc 466 ms, no changes.   Recent Labs: 05/27/2016: Magnesium 1.5 06/14/2016: Brain Natriuretic Peptide 865.7 06/30/2016: ALT 18; BUN 40; Creat 1.21; Hemoglobin 10.1; Platelets 63; Potassium 4.4; Sodium 137   Recent Lipid Panel    Component Value Date/Time   CHOL 111 02/02/2012 0910   TRIG 76 02/02/2012 0910   HDL 24 (L) 02/02/2012 0910   CHOLHDL 4.6 02/02/2012 0910   VLDL 15 02/02/2012 0910   LDLCALC 72 02/02/2012 0910   LDLDIRECT 66.0 02/22/2008 0928     Physical Exam:    VS:  BP (!) 140/50   Pulse 64   Ht 6' (1.829 m)   Wt 177 lb 12.8 oz (80.6 kg)   BMI 24.11 kg/m     Wt Readings from Last 3 Encounters:  08/03/16 177 lb 12.8 oz (80.6 kg)  07/01/16 168 lb 9.6 oz (76.5 kg)  06/30/16 166 lb 12 oz (75.6 kg)     Physical Exam  Constitutional: He is oriented to person, place, and time. He appears well-developed and well-nourished. He has a sickly appearance.  HENT:  Head: Normocephalic and atraumatic.  Eyes: No scleral icterus.  Neck: No JVD present.  Cardiovascular: Normal rate and regular rhythm.  Exam reveals distant heart sounds.   No murmur heard. Pulmonary/Chest: He has decreased breath sounds. He has no rales.  Upper airway noises noted  Abdominal: Soft. There is no tenderness.  Musculoskeletal: He exhibits edema.  Trace bilateral ankle edema  Neurological: He is alert and oriented to person, place, and time.  Skin: Skin is warm and dry.  Psychiatric: He has a normal mood and affect.    ASSESSMENT:    1. Chronic systolic CHF (congestive heart failure) ([place])   2. S/P TAVR (transcatheter aortic valve replacement)   3. Chronic atrial fibrillation ([place])   4. Essential hypertension   5. Coronary artery disease involving coronary bypass graft of native heart without angina pectoris    PLAN:    In  order of problems listed above:  1. Chronic systolic CHF -  His EF was reduced and TAVR function was normal on echo in 5/17 during admit for sepsis. Given his comorbid conditions and DNR status, he is managed conservatively.   He is not on ACE/ARB due to hx of hyperkalemia.  His oncologist does not plan to pursue further chemotherapy for his lymphoma.  Continue current dose of Spiro, Toprol-XL, Lasix.  We discussed the advantages of Palliative Care and chronic disease management.  I think he would benefit from this.  It will also help in transitioning him to Hospice when that time approaches. He and his family are interested in [place].   -  Refer to [facility].   2. S/p TAVR -  Last Echo with stable valve function.  Continue SBE prophylaxis.  Continue ASA, Plavix.   3. Chronic AF -  His rate is controlled.  As noted previously, he is not a candidate for anticoagulation due to thrombocytopenia, fall  risk and frailty.   4. HTN - BP is controlled.   5. CAD - He is s/p CABG and subsequent PCI with DES to S-PDA in 2015 prior to TAVR.  He denies angina.  Continue current regimen with ASA, Plavix, statin.   Medication Adjustments/Labs and Tests Ordered: Current medicines are reviewed at length with the patient today.  Concerns regarding medicines are outlined above.  Medication changes, Labs and Tests ordered today are outlined in the Patient Instructions noted below. Patient Instructions  Medication Instructions:  Your physician recommends that you continue on your current medications as directed. Please refer to the Current Medication list given to you today. Labwork: NONE Testing/Procedures: NONE Follow-Up: DR. Burt Knack ON 11/22/16 @ 9:15 YOU ARE BEING REFERRED TO [facility] OF [place] Any Other Special Instructions Will Be Listed Below (If Applicable). If you need a refill on your cardiac medications before your next appointment,  please call your pharmacy.  Signed, Richardson Dopp, PA-C  08/03/2016 4:49 PM    [facility] [place] [place], [place], [place]  [address] Phone: [phone]; Fax: [phone]

## 2016-08-03 ENCOUNTER — Ambulatory Visit (INDEPENDENT_AMBULATORY_CARE_PROVIDER_SITE_OTHER): Payer: Medicare Other | Admitting: Physician Assistant

## 2016-08-03 ENCOUNTER — Encounter: Payer: Self-pay | Admitting: Physician Assistant

## 2016-08-03 VITALS — BP 140/50 | HR 64 | Ht 72.0 in | Wt 177.8 lb

## 2016-08-03 DIAGNOSIS — I482 Chronic atrial fibrillation, unspecified: Secondary | ICD-10-CM

## 2016-08-03 DIAGNOSIS — I5022 Chronic systolic (congestive) heart failure: Secondary | ICD-10-CM

## 2016-08-03 DIAGNOSIS — I10 Essential (primary) hypertension: Secondary | ICD-10-CM

## 2016-08-03 DIAGNOSIS — Z954 Presence of other heart-valve replacement: Secondary | ICD-10-CM | POA: Diagnosis not present

## 2016-08-03 DIAGNOSIS — I25810 Atherosclerosis of coronary artery bypass graft(s) without angina pectoris: Secondary | ICD-10-CM

## 2016-08-03 DIAGNOSIS — Z952 Presence of prosthetic heart valve: Secondary | ICD-10-CM

## 2016-08-03 NOTE — Patient Instructions (Addendum)
Medication Instructions:  Your physician recommends that you continue on your current medications as directed. Please refer to the Current Medication list given to you today. Labwork: NONE Testing/Procedures: NONE Follow-Up: DR. Burt Knack ON 11/22/16 @ 9:15 YOU ARE BEING REFERRED TO [facility] AND [facility] OF [place] Any Other Special Instructions Will Be Listed Below (If Applicable). If you need a refill on your cardiac medications before your next appointment, please call your pharmacy.

## 2016-08-10 ENCOUNTER — Telehealth: Payer: Self-pay | Admitting: Physician Assistant

## 2016-08-10 NOTE — Telephone Encounter (Signed)
Referral made last week.   Order in system. The holiday yesterday may have delayed it some. If they do not hear from Palliative Care by next Monday, call back and we will need to contact them to see what the time frame is for the referral. Richardson Dopp, PA-C   08/10/2016 12:13 PM

## 2016-08-10 NOTE — Telephone Encounter (Signed)
New message   Pt wife verbalized that she is calling because Nicki Reaper was suppose to provide a referral for the pt to go to Palliative Care an she has not heard back form the rn

## 2016-08-10 NOTE — Telephone Encounter (Signed)
Called pateint's wife back to inform her of [employer] message. Patient's wife stated she would call back on next Monday if she does not hear from them by that time.

## 2016-08-18 ENCOUNTER — Telehealth: Payer: Self-pay | Admitting: Cardiovascular Disease

## 2016-08-18 NOTE — Telephone Encounter (Signed)
Difficult for me to provide input on this. I think it would be fine to go see a dermatologist as long as they understand that his care is being approached in a pallitative manner and procedures would only be performed for patient comfort/quality of life.

## 2016-08-18 NOTE — Telephone Encounter (Signed)
Spoke with pt's wife. She reports PA with palliative care came out to see pt and thinks pt should see his dermatologist regarding area near temple. Wife states PA told her it may possibly be cancerous. Pt's dermatologist is Dr. Ronnald Ramp.  Wife states area on temple "looks bad." She is worried about this but also concerned about taking pt to dermatologist. She is asking if Dr Burt Knack thinks pt should be seen by dermatology and if he needs procedure on area should he have it done.

## 2016-08-18 NOTE — Telephone Encounter (Signed)
I spoke with pt's wife and gave her information from [place].

## 2016-08-18 NOTE — Telephone Encounter (Signed)
New Message:    Pt is under Pallative Care,she have some questions and concerns about taking him to a Dermatologist.Pt has cancer and he is too weak to treat.

## 2016-09-21 IMAGING — US US PARACENTESIS
1 series · 8 of 8 positions shown · non-contrast
Comparison: none

INDICATION: Patient with history of sepsis/bacteremia, mantle cell lymphoma,
ascites. Request made for diagnostic and therapeutic paracentesis.

[Series 1: us paracentesis · 0.26mm/px · 8 of 8 slices shown]
[im 1/8]
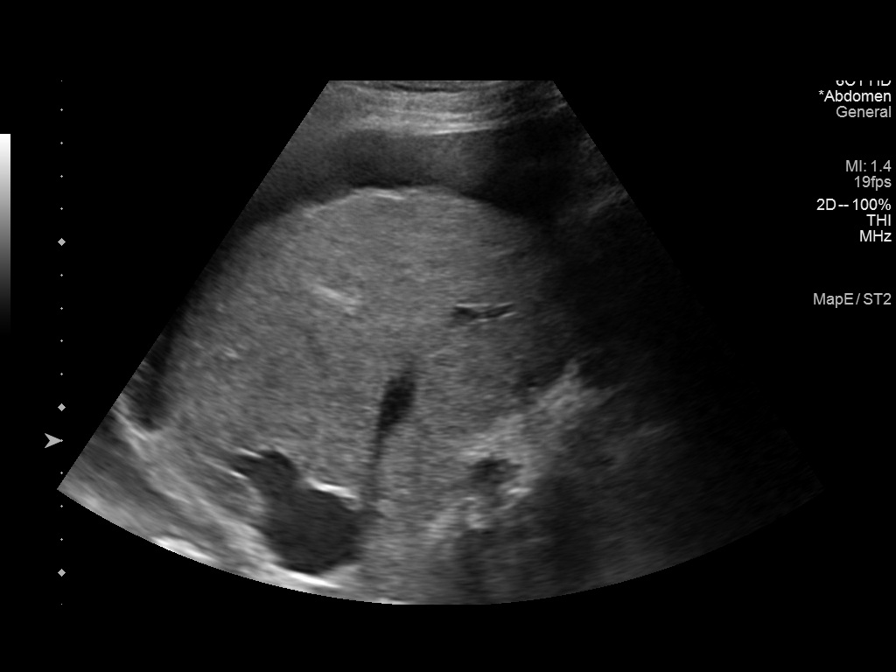
[im 2/8]
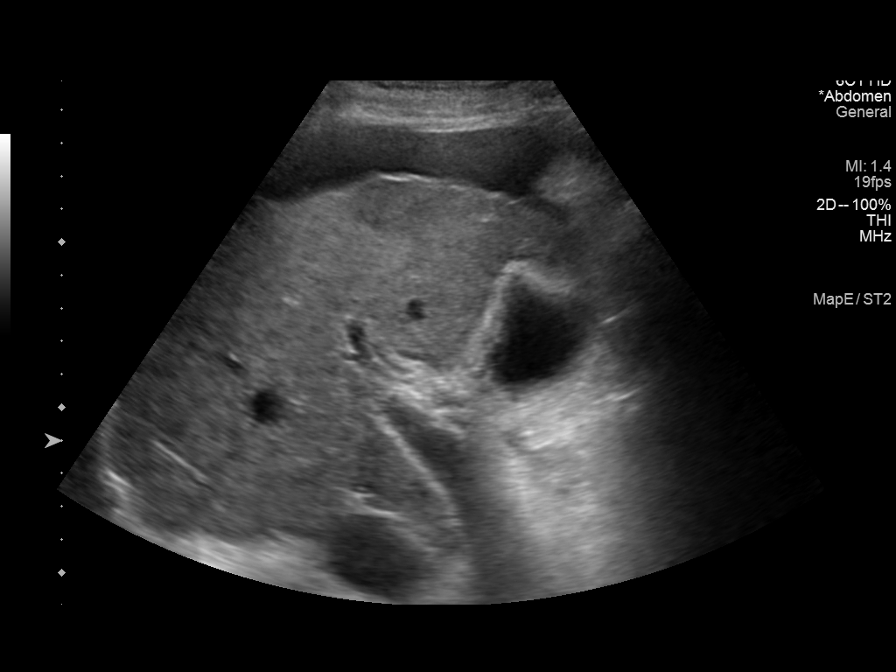
[im 3/8]
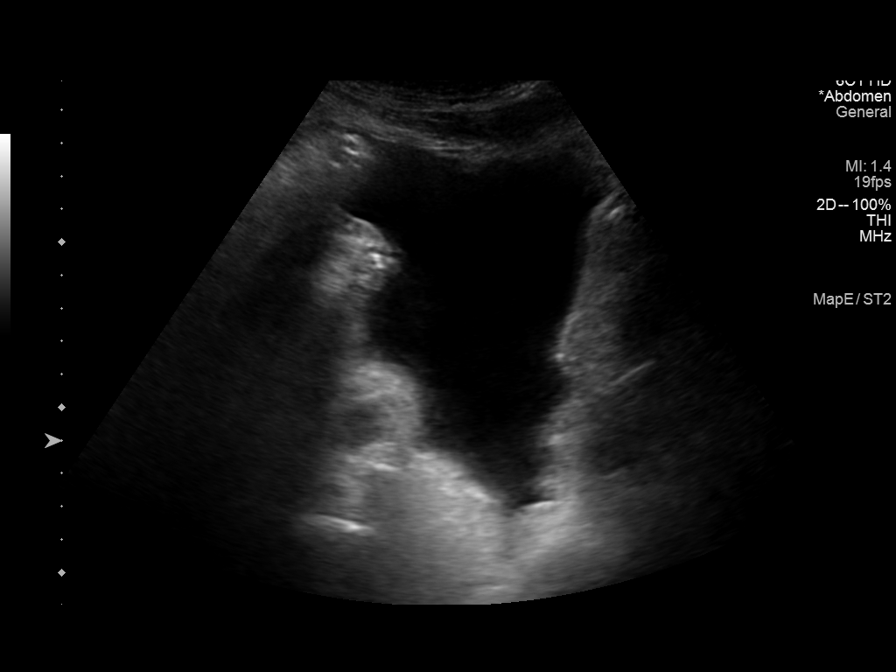
[im 4/8]
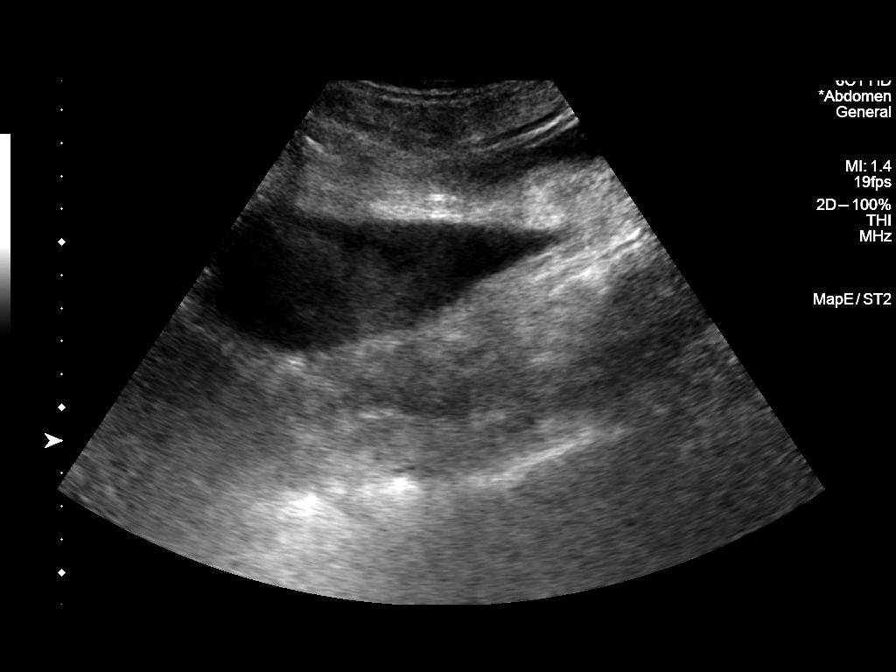
[im 5/8]
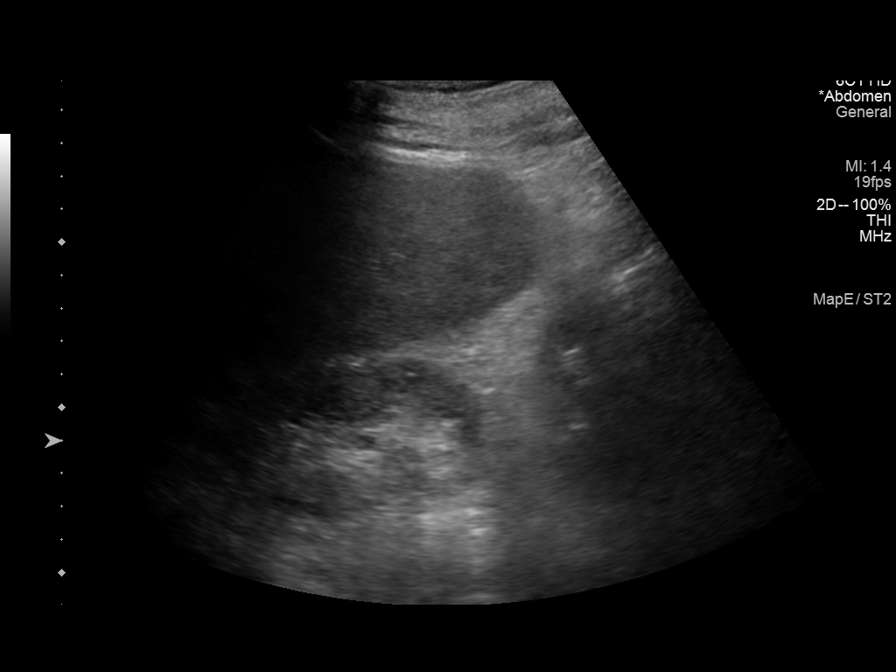
[im 6/8]
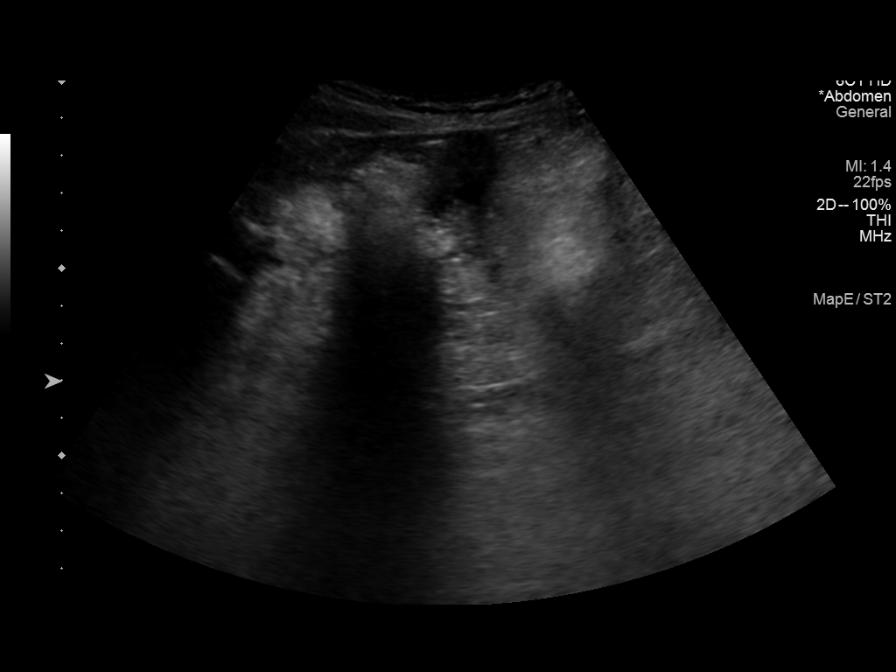
[im 7/8]
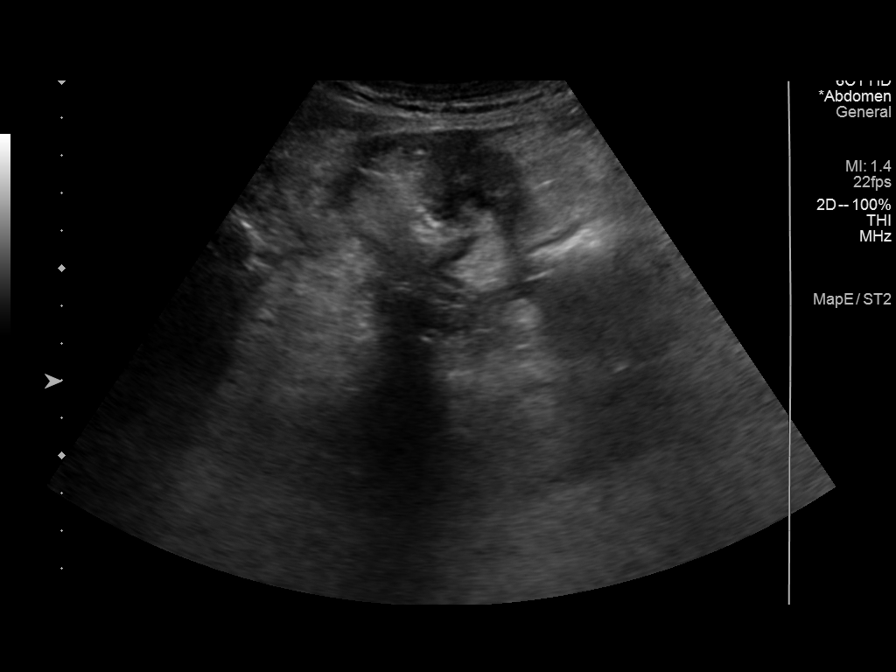
[im 8/8]
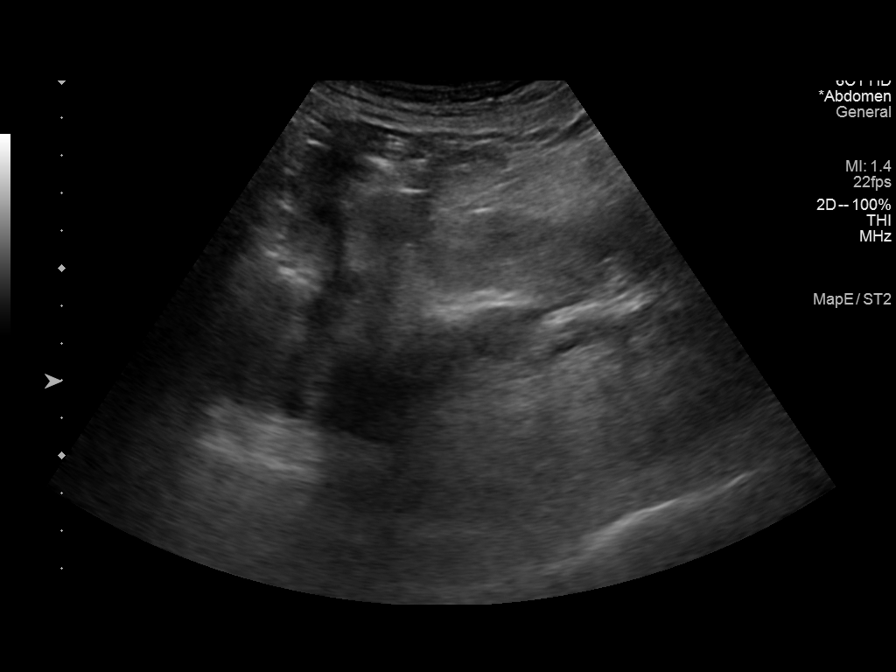

[8 of 8 positions shown; findings below may reference images not displayed]

EXAM:
ULTRASOUND GUIDED DIAGNOSTIC AND THERAPEUTIC PARACENTESIS

MEDICATIONS:
None.

COMPLICATIONS:
None immediate.

PROCEDURE:
Informed written consent was obtained from the patient's family
after a discussion of the risks, benefits and alternatives to
treatment. A timeout was performed prior to the initiation of the
procedure.

Initial ultrasound scanning demonstrates a small amount of ascites
within the right lower abdominal quadrant. The right lower abdomen
was prepped and draped in the usual sterile fashion. 1% lidocaine
was used for local anesthesia.

Following this, a Yueh catheter was introduced. An ultrasound image
was saved for documentation purposes. The paracentesis was
performed. The catheter was removed and a dressing was applied. The
patient tolerated the procedure well without immediate post
procedural complication.
FINDINGS: A total of approximately 1.2 liters of slightly hazy, yellow fluid
was removed. Samples were sent to the laboratory as requested by the
clinical team.
IMPRESSION: Successful ultrasound-guided diagnostic and therapeutic paracentesis
yielding 1.2 liters of peritoneal fluid.

## 2016-10-04 ENCOUNTER — Telehealth: Payer: Self-pay | Admitting: Cardiovascular Disease

## 2016-10-04 NOTE — Telephone Encounter (Signed)
New message      Calling to let Dr Burt Knack know pt died October 12, 2016

## 2016-10-06 DEATH — deceased

## 2016-10-19 LAB — PH, BODY FLUID: pH, Body Fluid: 7.7

## 2016-11-22 ENCOUNTER — Ambulatory Visit: Payer: Medicare Other | Admitting: Cardiovascular Disease

## 2016-11-30 ENCOUNTER — Ambulatory Visit: Payer: Medicare Other | Admitting: Cardiovascular Disease
